# Patient Record
Sex: Female | Born: 1940 | Race: Black or African American | Hispanic: No | Smoking: Former smoker
Health system: Southern US, Community
[De-identification: ages and names within clinical notes are randomized; demographics above are authoritative.]

## PROBLEM LIST (undated history)

## (undated) DIAGNOSIS — D689 Coagulation defect, unspecified: Secondary | ICD-10-CM

## (undated) DIAGNOSIS — IMO0002 Reserved for concepts with insufficient information to code with codable children: Secondary | ICD-10-CM

## (undated) DIAGNOSIS — C50919 Malignant neoplasm of unspecified site of unspecified female breast: Secondary | ICD-10-CM

## (undated) DIAGNOSIS — Z803 Family history of malignant neoplasm of breast: Secondary | ICD-10-CM

## (undated) DIAGNOSIS — E88819 Insulin resistance, unspecified: Secondary | ICD-10-CM

## (undated) DIAGNOSIS — Z8042 Family history of malignant neoplasm of prostate: Secondary | ICD-10-CM

## (undated) DIAGNOSIS — M25571 Pain in right ankle and joints of right foot: Secondary | ICD-10-CM

## (undated) DIAGNOSIS — I5030 Unspecified diastolic (congestive) heart failure: Secondary | ICD-10-CM

## (undated) DIAGNOSIS — I2699 Other pulmonary embolism without acute cor pulmonale: Secondary | ICD-10-CM

## (undated) DIAGNOSIS — H269 Unspecified cataract: Secondary | ICD-10-CM

## (undated) DIAGNOSIS — R0602 Shortness of breath: Secondary | ICD-10-CM

## (undated) DIAGNOSIS — Z972 Presence of dental prosthetic device (complete) (partial): Secondary | ICD-10-CM

## (undated) DIAGNOSIS — R109 Unspecified abdominal pain: Secondary | ICD-10-CM

## (undated) DIAGNOSIS — M1990 Unspecified osteoarthritis, unspecified site: Secondary | ICD-10-CM

## (undated) DIAGNOSIS — I82409 Acute embolism and thrombosis of unspecified deep veins of unspecified lower extremity: Secondary | ICD-10-CM

## (undated) DIAGNOSIS — N63 Unspecified lump in unspecified breast: Secondary | ICD-10-CM

## (undated) DIAGNOSIS — E8881 Metabolic syndrome: Secondary | ICD-10-CM

## (undated) DIAGNOSIS — I878 Other specified disorders of veins: Secondary | ICD-10-CM

## (undated) DIAGNOSIS — C541 Malignant neoplasm of endometrium: Secondary | ICD-10-CM

## (undated) DIAGNOSIS — IMO0001 Reserved for inherently not codable concepts without codable children: Secondary | ICD-10-CM

## (undated) DIAGNOSIS — Z973 Presence of spectacles and contact lenses: Secondary | ICD-10-CM

## (undated) DIAGNOSIS — I10 Essential (primary) hypertension: Secondary | ICD-10-CM

## (undated) HISTORY — DX: Family history of malignant neoplasm of prostate: Z80.42

## (undated) HISTORY — DX: Unspecified abdominal pain: R10.9

## (undated) HISTORY — DX: Other pulmonary embolism without acute cor pulmonale: I26.99

## (undated) HISTORY — DX: Reserved for concepts with insufficient information to code with codable children: IMO0002

## (undated) HISTORY — DX: Other specified disorders of veins: I87.8

## (undated) HISTORY — DX: Reserved for inherently not codable concepts without codable children: IMO0001

## (undated) HISTORY — DX: Malignant neoplasm of unspecified site of unspecified female breast: C50.919

## (undated) HISTORY — DX: Essential (primary) hypertension: I10

## (undated) HISTORY — DX: Family history of malignant neoplasm of breast: Z80.3

## (undated) HISTORY — DX: Insulin resistance, unspecified: E88.819

## (undated) HISTORY — DX: Unspecified diastolic (congestive) heart failure: I50.30

## (undated) HISTORY — DX: Acute embolism and thrombosis of unspecified deep veins of unspecified lower extremity: I82.409

## (undated) HISTORY — DX: Unspecified lump in unspecified breast: N63.0

## (undated) HISTORY — DX: Metabolic syndrome: E88.81

## (undated) HISTORY — DX: Pain in right ankle and joints of right foot: M25.571

## (undated) HISTORY — DX: Unspecified cataract: H26.9

## (undated) HISTORY — DX: Coagulation defect, unspecified: D68.9

---

## 1998-01-07 ENCOUNTER — Emergency Department (HOSPITAL_COMMUNITY): Admission: EM | Admit: 1998-01-07 | Discharge: 1998-01-07 | Payer: Self-pay | Admitting: Emergency Medicine

## 1998-01-15 ENCOUNTER — Emergency Department (HOSPITAL_COMMUNITY): Admission: EM | Admit: 1998-01-15 | Discharge: 1998-01-15 | Payer: Self-pay | Admitting: Emergency Medicine

## 1999-06-15 ENCOUNTER — Ambulatory Visit (HOSPITAL_COMMUNITY): Admission: RE | Admit: 1999-06-15 | Discharge: 1999-06-15 | Payer: Self-pay | Admitting: Family Medicine

## 1999-06-15 ENCOUNTER — Encounter: Payer: Self-pay | Admitting: Family Medicine

## 2000-10-29 ENCOUNTER — Inpatient Hospital Stay (HOSPITAL_COMMUNITY): Admission: RE | Admit: 2000-10-29 | Discharge: 2000-10-30 | Payer: Self-pay | Admitting: Family Medicine

## 2001-01-27 ENCOUNTER — Emergency Department (HOSPITAL_COMMUNITY): Admission: EM | Admit: 2001-01-27 | Discharge: 2001-01-27 | Payer: Self-pay | Admitting: Emergency Medicine

## 2001-01-27 ENCOUNTER — Encounter: Payer: Self-pay | Admitting: Emergency Medicine

## 2001-02-18 ENCOUNTER — Ambulatory Visit: Admission: RE | Admit: 2001-02-18 | Discharge: 2001-02-18 | Payer: Self-pay | Admitting: Family Medicine

## 2001-03-30 ENCOUNTER — Ambulatory Visit (HOSPITAL_COMMUNITY): Admission: RE | Admit: 2001-03-30 | Discharge: 2001-03-30 | Payer: Self-pay | Admitting: *Deleted

## 2001-03-30 ENCOUNTER — Encounter: Payer: Self-pay | Admitting: *Deleted

## 2001-05-19 ENCOUNTER — Ambulatory Visit (HOSPITAL_COMMUNITY): Admission: RE | Admit: 2001-05-19 | Discharge: 2001-05-19 | Payer: Self-pay | Admitting: Family Medicine

## 2001-05-19 ENCOUNTER — Encounter: Payer: Self-pay | Admitting: Family Medicine

## 2001-08-07 ENCOUNTER — Emergency Department (HOSPITAL_COMMUNITY): Admission: EM | Admit: 2001-08-07 | Discharge: 2001-08-08 | Payer: Self-pay | Admitting: Emergency Medicine

## 2004-05-02 ENCOUNTER — Emergency Department (HOSPITAL_COMMUNITY): Admission: EM | Admit: 2004-05-02 | Discharge: 2004-05-02 | Payer: Self-pay | Admitting: Family Medicine

## 2004-06-05 ENCOUNTER — Ambulatory Visit: Payer: Self-pay | Admitting: Family Medicine

## 2004-06-07 ENCOUNTER — Encounter (INDEPENDENT_AMBULATORY_CARE_PROVIDER_SITE_OTHER): Payer: Self-pay | Admitting: *Deleted

## 2004-06-07 LAB — CONVERTED CEMR LAB

## 2004-06-24 ENCOUNTER — Encounter: Admission: RE | Admit: 2004-06-24 | Discharge: 2004-06-24 | Payer: Self-pay | Admitting: Sports Medicine

## 2004-06-27 ENCOUNTER — Ambulatory Visit: Payer: Self-pay | Admitting: Sports Medicine

## 2004-08-12 ENCOUNTER — Ambulatory Visit: Payer: Self-pay | Admitting: Family Medicine

## 2005-01-01 ENCOUNTER — Ambulatory Visit: Payer: Self-pay | Admitting: Family Medicine

## 2005-07-18 ENCOUNTER — Encounter: Admission: RE | Admit: 2005-07-18 | Discharge: 2005-07-18 | Payer: Self-pay | Admitting: Sports Medicine

## 2005-08-05 ENCOUNTER — Ambulatory Visit: Payer: Self-pay | Admitting: Family Medicine

## 2005-08-19 ENCOUNTER — Ambulatory Visit: Payer: Self-pay | Admitting: Sports Medicine

## 2005-09-30 ENCOUNTER — Ambulatory Visit: Payer: Self-pay | Admitting: Family Medicine

## 2005-11-04 ENCOUNTER — Ambulatory Visit: Payer: Self-pay | Admitting: Family Medicine

## 2005-11-14 ENCOUNTER — Ambulatory Visit: Payer: Self-pay | Admitting: Family Medicine

## 2006-01-02 ENCOUNTER — Ambulatory Visit: Payer: Self-pay | Admitting: Sports Medicine

## 2006-01-22 ENCOUNTER — Ambulatory Visit: Payer: Self-pay | Admitting: Family Medicine

## 2006-07-13 ENCOUNTER — Ambulatory Visit: Payer: Self-pay | Admitting: Family Medicine

## 2006-07-13 ENCOUNTER — Encounter: Admission: RE | Admit: 2006-07-13 | Discharge: 2006-07-13 | Payer: Self-pay | Admitting: Sports Medicine

## 2006-07-24 ENCOUNTER — Encounter: Admission: RE | Admit: 2006-07-24 | Discharge: 2006-07-24 | Payer: Self-pay | Admitting: Family Medicine

## 2006-07-31 ENCOUNTER — Encounter (INDEPENDENT_AMBULATORY_CARE_PROVIDER_SITE_OTHER): Payer: Self-pay | Admitting: *Deleted

## 2006-09-04 ENCOUNTER — Ambulatory Visit: Payer: Self-pay | Admitting: Family Medicine

## 2006-09-04 DIAGNOSIS — M1710 Unilateral primary osteoarthritis, unspecified knee: Secondary | ICD-10-CM

## 2006-09-04 DIAGNOSIS — IMO0002 Reserved for concepts with insufficient information to code with codable children: Secondary | ICD-10-CM | POA: Insufficient documentation

## 2006-09-14 ENCOUNTER — Emergency Department (HOSPITAL_COMMUNITY): Admission: EM | Admit: 2006-09-14 | Discharge: 2006-09-14 | Payer: Self-pay | Admitting: Emergency Medicine

## 2006-09-15 ENCOUNTER — Encounter: Payer: Self-pay | Admitting: Family Medicine

## 2006-09-24 ENCOUNTER — Ambulatory Visit: Payer: Self-pay | Admitting: Sports Medicine

## 2006-09-24 DIAGNOSIS — J45909 Unspecified asthma, uncomplicated: Secondary | ICD-10-CM | POA: Insufficient documentation

## 2006-09-24 DIAGNOSIS — I10 Essential (primary) hypertension: Secondary | ICD-10-CM | POA: Insufficient documentation

## 2006-09-25 ENCOUNTER — Telehealth: Payer: Self-pay | Admitting: *Deleted

## 2006-09-30 ENCOUNTER — Telehealth: Payer: Self-pay | Admitting: *Deleted

## 2006-09-30 ENCOUNTER — Encounter: Payer: Self-pay | Admitting: Family Medicine

## 2007-03-30 ENCOUNTER — Encounter: Payer: Self-pay | Admitting: Family Medicine

## 2007-03-30 ENCOUNTER — Ambulatory Visit: Payer: Self-pay | Admitting: Family Medicine

## 2007-03-30 LAB — CONVERTED CEMR LAB
BUN: 10 mg/dL (ref 6–23)
CO2: 28 meq/L (ref 19–32)
Calcium: 9.6 mg/dL (ref 8.4–10.5)
Chloride: 103 meq/L (ref 96–112)
Creatinine, Ser: 0.73 mg/dL (ref 0.40–1.20)
Glucose, Bld: 113 mg/dL — ABNORMAL HIGH (ref 70–99)
Potassium: 4 meq/L (ref 3.5–5.3)
Sodium: 141 meq/L (ref 135–145)
TSH: 2.201 microintl units/mL (ref 0.350–5.50)

## 2007-07-26 ENCOUNTER — Encounter: Admission: RE | Admit: 2007-07-26 | Discharge: 2007-07-26 | Payer: Self-pay | Admitting: Sports Medicine

## 2008-01-27 ENCOUNTER — Encounter (INDEPENDENT_AMBULATORY_CARE_PROVIDER_SITE_OTHER): Payer: Self-pay | Admitting: Family Medicine

## 2008-01-27 ENCOUNTER — Ambulatory Visit: Payer: Self-pay | Admitting: Family Medicine

## 2008-01-27 DIAGNOSIS — E669 Obesity, unspecified: Secondary | ICD-10-CM | POA: Insufficient documentation

## 2008-01-27 LAB — CONVERTED CEMR LAB
Cholesterol: 154 mg/dL (ref 0–200)
HDL: 49 mg/dL (ref >39)
LDL Cholesterol: 93 mg/dL (ref 0–99)
Total CHOL/HDL Ratio: 3.1
Triglycerides: 59 mg/dL (ref <150)
VLDL: 12 mg/dL (ref 0–40)

## 2008-02-03 ENCOUNTER — Encounter (INDEPENDENT_AMBULATORY_CARE_PROVIDER_SITE_OTHER): Payer: Self-pay | Admitting: Family Medicine

## 2008-03-06 ENCOUNTER — Ambulatory Visit: Payer: Self-pay | Admitting: Family Medicine

## 2008-07-27 ENCOUNTER — Encounter: Admission: RE | Admit: 2008-07-27 | Discharge: 2008-07-27 | Payer: Self-pay | Admitting: Family Medicine

## 2008-07-28 ENCOUNTER — Ambulatory Visit: Payer: Self-pay | Admitting: Family Medicine

## 2008-07-28 DIAGNOSIS — Z8672 Personal history of thrombophlebitis: Secondary | ICD-10-CM | POA: Insufficient documentation

## 2008-08-16 ENCOUNTER — Encounter: Admission: RE | Admit: 2008-08-16 | Discharge: 2008-08-16 | Payer: Self-pay | Admitting: Family Medicine

## 2009-01-19 ENCOUNTER — Ambulatory Visit: Payer: Self-pay | Admitting: Family Medicine

## 2009-01-19 ENCOUNTER — Encounter: Payer: Self-pay | Admitting: Family Medicine

## 2009-01-19 LAB — CONVERTED CEMR LAB
BUN: 15 mg/dL (ref 6–23)
CO2: 25 meq/L (ref 19–32)
Calcium: 9 mg/dL (ref 8.4–10.5)
Chloride: 104 meq/L (ref 96–112)
Cholesterol: 154 mg/dL (ref 0–200)
Creatinine, Ser: 0.73 mg/dL (ref 0.40–1.20)
Glucose, Bld: 111 mg/dL — ABNORMAL HIGH (ref 70–99)
HDL: 45 mg/dL (ref >39)
LDL Cholesterol: 98 mg/dL (ref 0–99)
Potassium: 3.8 meq/L (ref 3.5–5.3)
Sodium: 141 meq/L (ref 135–145)
Total CHOL/HDL Ratio: 3.4
Triglycerides: 55 mg/dL (ref <150)
VLDL: 11 mg/dL (ref 0–40)

## 2009-03-08 ENCOUNTER — Ambulatory Visit: Payer: Self-pay | Admitting: Family Medicine

## 2009-04-27 ENCOUNTER — Emergency Department (HOSPITAL_COMMUNITY): Admission: EM | Admit: 2009-04-27 | Discharge: 2009-04-27 | Payer: Self-pay | Admitting: Emergency Medicine

## 2009-05-07 ENCOUNTER — Encounter: Payer: Self-pay | Admitting: Family Medicine

## 2009-05-10 ENCOUNTER — Telehealth (INDEPENDENT_AMBULATORY_CARE_PROVIDER_SITE_OTHER): Payer: Self-pay | Admitting: *Deleted

## 2009-05-10 ENCOUNTER — Ambulatory Visit: Payer: Self-pay | Admitting: Family Medicine

## 2009-05-10 DIAGNOSIS — R42 Dizziness and giddiness: Secondary | ICD-10-CM | POA: Insufficient documentation

## 2009-05-10 LAB — CONVERTED CEMR LAB
Bilirubin Urine: NEGATIVE
Glucose, Urine, Semiquant: NEGATIVE
Hgb A1c MFr Bld: 6 %
Ketones, urine, test strip: NEGATIVE
Nitrite: NEGATIVE
Protein, U semiquant: NEGATIVE
Specific Gravity, Urine: 1.02
Urobilinogen, UA: 0.2
pH: 6

## 2009-05-11 ENCOUNTER — Telehealth: Payer: Self-pay | Admitting: Family Medicine

## 2009-09-05 ENCOUNTER — Encounter: Admission: RE | Admit: 2009-09-05 | Discharge: 2009-09-05 | Payer: Self-pay | Admitting: Family Medicine

## 2009-10-25 ENCOUNTER — Ambulatory Visit: Payer: Self-pay | Admitting: Family Medicine

## 2009-10-25 DIAGNOSIS — I872 Venous insufficiency (chronic) (peripheral): Secondary | ICD-10-CM | POA: Insufficient documentation

## 2010-01-04 ENCOUNTER — Encounter: Payer: Self-pay | Admitting: Family Medicine

## 2010-01-04 ENCOUNTER — Ambulatory Visit: Payer: Self-pay | Admitting: Family Medicine

## 2010-01-04 LAB — CONVERTED CEMR LAB
BUN: 10 mg/dL (ref 6–23)
CO2: 28 meq/L (ref 19–32)
Calcium: 9.2 mg/dL (ref 8.4–10.5)
Chloride: 100 meq/L (ref 96–112)
Creatinine, Ser: 0.92 mg/dL (ref 0.40–1.20)
Glucose, Bld: 95 mg/dL (ref 70–99)
Hgb A1c MFr Bld: 6.1 %
Potassium: 3.6 meq/L (ref 3.5–5.3)
Sodium: 138 meq/L (ref 135–145)

## 2010-02-01 ENCOUNTER — Ambulatory Visit: Payer: Self-pay | Admitting: Family Medicine

## 2010-02-01 ENCOUNTER — Encounter: Payer: Self-pay | Admitting: Family Medicine

## 2010-02-01 LAB — CONVERTED CEMR LAB
BUN: 18 mg/dL (ref 6–23)
CO2: 29 meq/L (ref 19–32)
Calcium: 9.6 mg/dL (ref 8.4–10.5)
Chloride: 100 meq/L (ref 96–112)
Creatinine, Ser: 0.89 mg/dL (ref 0.40–1.20)
Glucose, Bld: 116 mg/dL — ABNORMAL HIGH (ref 70–99)
Potassium: 3.6 meq/L (ref 3.5–5.3)
Sodium: 141 meq/L (ref 135–145)

## 2010-03-19 ENCOUNTER — Ambulatory Visit: Payer: Self-pay | Admitting: Family Medicine

## 2010-06-04 ENCOUNTER — Ambulatory Visit
Admission: RE | Admit: 2010-06-04 | Discharge: 2010-06-04 | Payer: Self-pay | Source: Home / Self Care | Attending: Family Medicine | Admitting: Family Medicine

## 2010-06-10 ENCOUNTER — Encounter: Payer: Self-pay | Admitting: Family Medicine

## 2010-06-18 ENCOUNTER — Ambulatory Visit
Admission: RE | Admit: 2010-06-18 | Discharge: 2010-06-18 | Payer: Self-pay | Source: Home / Self Care | Attending: Sports Medicine | Admitting: Sports Medicine

## 2010-06-18 ENCOUNTER — Ambulatory Visit
Admission: RE | Admit: 2010-06-18 | Discharge: 2010-06-18 | Payer: Self-pay | Source: Home / Self Care | Attending: Family Medicine | Admitting: Family Medicine

## 2010-06-18 DIAGNOSIS — M2140 Flat foot [pes planus] (acquired), unspecified foot: Secondary | ICD-10-CM | POA: Insufficient documentation

## 2010-06-23 ENCOUNTER — Encounter: Payer: Self-pay | Admitting: Family Medicine

## 2010-07-01 ENCOUNTER — Ambulatory Visit: Admission: RE | Admit: 2010-07-01 | Discharge: 2010-07-01 | Payer: Self-pay | Source: Home / Self Care

## 2010-07-02 NOTE — Progress Notes (Signed)
Summary: phn msg  Phone Note Call from Patient Call back at Home Phone [phone]   Caller: Patient Summary of Call: thinks that the metformin is giving her diarrhea and is not sure if she should take anymore. Initial call taken by: De Nurse,  May 11, 2009 2:58 PM  Follow-up for Phone Call        spoke with Dr. Deirdre Priest. called pt & told her to take a day off with no metformin. then restart with half pill x 1 week, then go to 1 pill x 1 week then up to the 2 tabs daily that is md goal. to buy & use immodium. call if this continues. fyi to pcp... Follow-up by: Golden Circle RN,  May 11, 2009 3:01 PM  Additional Follow-up for Phone Call Additional follow up Details #1::        thanks Additional Follow-up by: Jamie Brookes MD,  May 16, 2009 7:34 AM

## 2010-07-02 NOTE — Assessment & Plan Note (Signed)
Summary: ANKLE AND FOOT PROBLEM/KH  Medications Added FUROSEMIDE 20 MG  TABS (FUROSEMIDE) use as directed ADVAIR DISKUS 100-50 MCG/DOSE MISC (FLUTICASONE-SALMETEROL) Inhale 1 puff as directed twice a day ALBUTEROL 90 MCG/ACT AERS (ALBUTEROL) Inhale 2 puff using inhaler every four hours ENALAPRIL-HYDROCHLOROTHIAZIDE 10-25 MG TABS (ENALAPRIL-HYDROCHLOROTHIAZIDE)         Vital Signs:  Patient Profile:   70 Years Old Female Weight:      301.6 pounds Temp:     98.8 degrees F Pulse rate:   82 / minute BP sitting:   128 / 82  Pt. in pain?   no  Vitals Entered By: Altamese Dilling CMA, (March 30, 2007 8:35 AM)                  Chief Complaint:  Left ankle swollen/color change.  History of Present Illness: Swelling of legs, some darkening of the skin.  This worries her.  She tries to get up and move around while watching TV.  Does not like to walk for fear of dogs.  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 24 hour diet recall:  egg and balogna sandwich, baked potato with butter, meat load, cabbage and mashed potatoes.  She drinks sweet tea all day.    Review of prevention, has not had screening colonoscopy, stool card negative in 07.  Flu shot this year and mammogram not due.  Normal PAP in 06, never had an abnormal PAP, and has not been sexually active so will go to every 3 years unless she has symptoms of bleeding or pain.      Risk Factors:  Mammogram History:     Date of Last Mammogram:  07/10/2000    Results:  normal    Review of Systems  General      Denies fatigue, fever, and sweats.  CV      Complains of swelling of feet.      Denies chest pain or discomfort and palpitations.  Resp      Denies shortness of breath, sputum productive, and wheezing.  GI      Denies bloody stools and change in bowel habits.  GU      Denies discharge, dysuria, and urinary frequency.  MS      Complains of joint pain and stiffness.      mostly left knee pain  and stiffness   Physical Exam  General:     alert, and obese (301) Neck:     No deformities, masses, or tenderness noted. Lungs:     Normal respiratory effort, chest expands symmetrically. Lungs are clear to auscultation, no crackles or wheezes. Heart:     Normal rate and regular rhythm. S1 and S2 normal without gallop, murmur, click, rub or other extra sounds. Extremities:     trace left pedal edema and trace right pedal edema. with skin changes consistent with venous stasis     Impression & Recommendations:  Problem # 1:  ANKLE EDEMA (ICD-782.3) Counseled on increasing venous return with stockings, elevation, exercise.  Lubricate skin to prevent dryness and cracking.  May use furosemide as needed for sever edema a few times per week only.  Eat extra K rich foods on that day. Her updated medication list for this problem includes:    Furosemide 20 Mg Tabs (Furosemide) ..... Use as directed    Enalapril-hydrochlorothiazide 10-25 Mg Tabs (Enalapril-hydrochlorothiazide)  Orders: TSH-[facility] ([id number])   Problem # 2:  HYPERTENSION, BENIGN ESSENTIAL (ICD-401.1) Well controlled Her  updated medication list for this problem includes:    Furosemide 20 Mg Tabs (Furosemide) ..... 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  Exercise in a non weight bearing manner.  May need Ortho referral for joint infections/replacement in the future.  Tylenol prn  Problem # 4:  ASTHMA, INTERMITTENT, MODERATE (ICD-493.10) Well controlled, not using Advair at this time. Her updated medication list for this problem includes:    Advair Diskus 100-50 Mcg/dose Misc (Fluticasone-salmeterol) ..... Inhale 1 puff as directed twice a day    Albuterol 90 Mcg/act Aers (Albuterol) ..... Inhale 2 puff using inhaler  every four hours   Complete Medication List: 1)  Furosemide 20 Mg Tabs (Furosemide) .... Use as directed 2)  Advair Diskus 100-50 Mcg/dose Misc (Fluticasone-salmeterol) .... Inhale 1 puff as directed twice a day 3)  Albuterol 90 Mcg/act Aers (Albuterol) .... Inhale 2 puff using inhaler every four hours 4)  Enalapril-hydrochlorothiazide 10-25 Mg Tabs (Enalapril-hydrochlorothiazide)  Other Orders: Gastroenterology Referral (GI)   Patient Instructions: 1)  Limit salt 2)  decrease carbs 3)  change to artificial sweetner for ice tea 4)  non weight bearing exercise 5)  return in 3 months 6)  Recommend colonoscopy-referral placed 7)  Schedule a colonoscopy/sigmoidoscopy to help detect colon cancer.    Prescriptions: FUROSEMIDE 20 MG  TABS (FUROSEMIDE) use as directed  #30 x 3   Entered and Authorized by:   Luretha Murphy NP   Signed by:   Luretha Murphy NP on 03/30/2007   Method used:   Electronically sent to ...       [facility] [address] [phone][facility]       [address]       [place], [place]  [address]       Ph: [phone] or [phone]       Fax: [phone]   RxID:   [id number]  ]  Preventive Care Screening  Last Flu Shot:    Date:  03/25/2007    Results:  given   Hemoccult:    Date:  07/09/2005    Results:  negative   Mammogram:    Date:  07/10/2000    Results:  normal     Appended Document: Orders Update    Clinical Lists Changes  Orders: Added new Test order of [facility]- Est Level  3 (63875) - Signed

## 2010-07-02 NOTE — Progress Notes (Signed)
Summary: Status of Advair  Phone Note Call from Patient Call back at [phone]   Summary of Call: is checking status of advair Initial call taken by: Haydee Salter,  September 30, 2006 11:54 AM  Follow-up for Phone Call        fax failed on 09/24/06 when sent in to pharmacy via Dr. Tiajuana Amass. resent today and pt notified. Follow-up by: Theresia Lo RN,  September 30, 2006 12:20 PM

## 2010-07-02 NOTE — Assessment & Plan Note (Signed)
Summary: HTN, Obesity, Edema, Labs   Vital Signs:  Patient profile:   70 year old female Weight:      299.2 pounds BMI:     48.47 Temp:     98.3 degrees F oral Pulse rate:   77 / minute BP sitting:   130 / 70  (left arm)  Vitals Entered By: Alphia Kava (January 19, 2009 8:39 AM) CC: HTN, Obesty, Edema, labs Is Patient Diabetic? No   Primary Care Provider:  Jamie Brookes MD  CC:  HTN, Obesty, Edema, and labs.  History of Present Illness: hypertension: Pt has well controlled hypertension. Her BP today is 130/70. She has not had any lightheadedness or dizziness. She is not feeling any side effects with her medications. She has no SOB or chest pains. She can lie flat but chooses to sleep on 2 pillows. She has no SOB unless her asthma flares up.   Obesity: Panning to start Silver Sneakers again. She has been watching her grandaughter for a few months and got out of exercising. Wants to start again.   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   Habits & Providers  Alcohol-Tobacco-Diet     Tobacco Status: never  Current Medications (verified): 1)  Furosemide 20 Mg  Tabs (Furosemide) .... One By Mouth As Needed For Fluid Overload and Swelling. 2)  Advair Diskus 100-50 Mcg/dose Misc (Fluticasone-Salmeterol) .... Inhale 1 Puff As Directed Twice A Day 3)  Albuterol 90 Mcg/act Aers (Albuterol) .... Inhale 2 Puff Using Inhaler Every Four Hours 4)  Enalapril-Hydrochlorothiazide 10-25 Mg Tabs (Enalapril-Hydrochlorothiazide) .... One By Mouth Daily For Blood Pressure. 5)  Aspirin 81 Mg  Tbec (Aspirin) .... One By Mouth Every Day 6)  Metamucil 48.57 % Powd (Psyllium)  Allergies (verified): No Known Drug Allergies  Social History: Smoking Status:  never  Review of Systems       see HPI.   Physical Exam  General:  Obese, in no acute distress; alert,appropriate and cooperative throughout examination Neck:  No  deformities, masses, or tenderness noted. Lungs:  Normal respiratory effort, chest expands symmetrically. Lungs are clear to auscultation, no crackles or wheezes. Heart:  Normal rate and regular rhythm. S1 and S2 normal without gallop, murmur, click, rub or other extra sounds. Extremities:  left lower leg swelling and skin thickened. Rt lower leg has minor swelling.  Skin:  venous stasis changes on Left lower leg Psych:  Cognition and judgment appear intact. Alert and cooperative with normal attention span and concentration. No apparent delusions, illusions, hallucinations   Impression & Recommendations:  Problem # 1:  HYPERTENSION, BENIGN ESSENTIAL (ICD-401.1) Assessment Improved BP is well controlled today. Encouraged pt to check at home or at pharmacy. Cont on meds.   Her updated medication list for this problem includes:    Furosemide 20 Mg Tabs (Furosemide) ..... One by mouth as needed for fluid overload and swelling.    Enalapril-hydrochlorothiazide 10-25 Mg Tabs (Enalapril-hydrochlorothiazide) ..... One by mouth daily for blood pressure.  Orders: Basic Met[facility] ([id number]) [facility]- Est  Level 4 (09811)  Problem # 2:  OBESITY (ICD-278.00) Assessment: Deteriorated Pt has incresed weiht by 7 lbs since her last visit. She has not been exercising. She plans to get back into doing the silver sneakers.   Orders: Lipid-[facility] ([id number]) [facility]- Est  Level 4 (13086)  Problem # 3:  ANKLE EDEMA (ICD-782.3) Assessment: Unchanged Pt has chornic edema in  lower Left leg. She has had it since she had a DVT. Cont to use occasional Lasix for swelling.  Her updated medication list for this problem includes:    Furosemide 20 Mg Tabs (Furosemide) ..... One by mouth as needed for fluid overload and swelling.    Enalapril-hydrochlorothiazide 10-25 Mg Tabs (Enalapril-hydrochlorothiazide) ..... One by mouth daily for blood pressure.  Orders: [facility]- Est  Level 4 ([address])  Complete Medication List: 1)   Furosemide 20 Mg Tabs (Furosemide) .... One by mouth as needed for fluid overload and swelling. 2)  Advair Diskus 100-50 Mcg/dose Misc (Fluticasone-salmeterol) .... Inhale 1 puff as directed twice a day 3)  Albuterol 90 Mcg/act Aers (Albuterol) .... Inhale 2 puff using inhaler every four hours 4)  Enalapril-hydrochlorothiazide 10-25 Mg Tabs (Enalapril-hydrochlorothiazide) .... One by mouth daily for blood pressure. 5)  Aspirin 81 Mg Tbec (Aspirin) .... One by mouth every day 6)  Metamucil 48.57 % Powd (Psyllium)  Patient Instructions: 1)  It was nice to meet you. 2)  We will get some lab work done today. We are checking your Cholesterol and electrolytes. I will call you if there are any abnormalities.  3)  We refilled your Lasix and Enalapril-HCTZ combo.  4)  Please schedule a follow-up appointment in 1 year.  Prescriptions: ENALAPRIL-HYDROCHLOROTHIAZIDE 10-25 MG TABS (ENALAPRIL-HYDROCHLOROTHIAZIDE) one by mouth daily for blood pressure.  #31 x 11   Entered and Authorized by:   Jamie Brookes MD   Signed by:   Jamie Brookes MD on 01/19/2009   Method used:   [facility]        [facility] [phone][facility] (retail)       [address]       [place], [place]  [address]       Ph: [phone] or [phone]       Fax: [phone]   RxID:   [id number] FUROSEMIDE 20 MG  TABS (FUROSEMIDE) one by mouth as needed for fluid overload and swelling.  #34 x 1   Entered and Authorized by:   Jamie Brookes MD   Signed by:   Jamie Brookes MD on 01/19/2009   Method used:   [facility] to        [facility] [phone]* (retail)       [address]       [place], [place]  [address]       Ph: [phone] or [phone]       Fax: [phone]   RxID:   [id number]    Prevention & Chronic Care Immunizations   Influenza vaccine: Fluvax 3+  (03/06/2008)   Influenza vaccine due: 03/24/2008    Tetanus booster:  01/06/2006: Done.   Tetanus booster due: 01/07/2016    Pneumococcal vaccine: Done.  (01/06/2006)   Pneumococcal vaccine due: None    H. zoster vaccine: Not documented  Colorectal Screening   Hemoccult: negative  (07/09/2005)   Hemoccult due: 07/09/2006    Colonoscopy: Not documented  Other Screening   Pap smear: Done.  (06/07/2004)   Pap smear due: Refused  (01/27/2008)    Mammogram: normal  (08/16/2008)   Mammogram due: 08/16/2009    DXA bone density scan: Not documented   Smoking status: never  (01/19/2009)  Lipids   Total Cholesterol: 154  (01/27/2008)   LDL: 93  (01/27/2008)   LDL Direct: Not documented   HDL: 49  (  01/27/2008)   Triglycerides: 59  (01/27/2008)  Hypertension   Last Blood Pressure: 130 / 70  (01/19/2009)   Serum creatinine: 0.73  (03/30/2007)   Serum potassium 4.0  (03/30/2007)    Hypertension flowsheet reviewed?: Yes  Self-Management Support :    Hypertension self-management support: Not documented    Prevention & Chronic Care Immunizations   Influenza vaccine: Fluvax 3+  (03/06/2008)   Influenza vaccine due: 03/24/2008    Tetanus booster: 01/06/2006: Done.   Tetanus booster due: 01/07/2016    Pneumococcal vaccine: Done.  (01/06/2006)   Pneumococcal vaccine due: None    H. zoster vaccine: Not documented  Colorectal Screening   Hemoccult: negative  (07/09/2005)   Hemoccult due: 07/09/2006    Colonoscopy: Not documented  Other Screening   Pap smear: Done.  (06/07/2004)   Pap smear due: Refused  (01/27/2008)    Mammogram: normal  (08/16/2008)   Mammogram due: 08/16/2009    DXA bone density scan: Not documented   Smoking status: never  (01/19/2009)  Lipids   Total Cholesterol: 154  (01/27/2008)   LDL: 93  (01/27/2008)   LDL Direct: Not documented   HDL: 49  (01/27/2008)   Triglycerides: 59  (01/27/2008)  Hypertension   Last Blood Pressure: 130 / 70  (01/19/2009)   Serum creatinine: 0.73  (03/30/2007)   Serum  potassium 4.0  (03/30/2007)    Hypertension flowsheet reviewed?: Yes  Self-Management Support :    Hypertension self-management support: Not documented

## 2010-07-02 NOTE — Assessment & Plan Note (Signed)
Summary: HTN/edema   Vital Signs:  Patient profile:   70 year old female Height:      66 inches Weight:      302 pounds BMI:     48.92 Temp:     98.0 degrees F oral Pulse rate:   87 / minute BP sitting:   148 / 76  (left arm) Cuff size:   large  Vitals Entered By: Jimmy Footman, CMA (January 04, 2010 1:42 PM) CC: f/u on pt. BP meds. Is Patient Diabetic? No Pain Assessment Patient in pain? no        Primary Care Provider:  Jamie Brookes MD  CC:  f/u on pt. BP meds..  History of Present Illness: Hypertension: Pt comes in today with elelvated BP and still has quite a bit of fluid on her legs. She says the swelling is a little better but she still has quite a bit of it. She sometimes put her legs up at night. Her BP was fairly well controlled up until now but has been slowly creeping up. She is urinating at night but says that this does not bother her. No complaint of SOB or chest pain. No fevers or chills.        Habits & Providers  Alcohol-Tobacco-Diet     Tobacco Status: quit  Current Medications (verified): 1)  Furosemide 40 Mg Tabs (Furosemide) .... Take One Pill Every Morning and Evening For Leg Swelling and Blood Pressure 2)  Advair Diskus 100-50 Mcg/dose Misc (Fluticasone-Salmeterol) .... Inhale 1 Puff As Directed Twice A Day 3)  Albuterol 90 Mcg/act Aers (Albuterol) .... Inhale 2 Puff Using Inhaler Every Four Hours 4)  Enalapril-Hydrochlorothiazide 10-25 Mg Tabs (Enalapril-Hydrochlorothiazide) .... One By Mouth Daily For Blood Pressure. 5)  Aspirin 81 Mg  Tbec (Aspirin) .... One By Mouth Every Day 6)  Colace 100 Mg Caps (Docusate Sodium) .... Take 1 Time A Day  Allergies (verified): No Known Drug Allergies  Physical Exam  General:  Well-developed,well-nourished,in no acute distress; alert,appropriate and cooperative throughout examination Lungs:  Normal respiratory effort, chest expands symmetrically. Lungs are clear to auscultation, no crackles or  wheezes. Heart:  Normal rate and regular rhythm. S1 and S2 normal without gallop, murmur, click, rub or other extra sounds. Extremities:  3+ pitting edema bilaterally, venous stasis changes bilaterally but worse on left leg.  Skin:  chronic venous stasis changes on left leg worse than right.  Psych:  Cognition and judgment appear intact. Alert and cooperative with normal attention span and concentration. No apparent delusions, illusions, hallucinations   Impression & Recommendations:  Problem # 1:  HYPERTENSION, BENIGN ESSENTIAL (ICD-401.1) Assessment Deteriorated PT's BP has worsened over the last few months. Will increase Lasix to two times a day to decrease BP and get more fluid off her legs. Pt will need an ECHO in the near future.   Her updated medication list for this problem includes:    Furosemide 40 Mg Tabs (Furosemide) .Marland Kitchen... Take one pill every morning and evening for leg swelling and blood pressure    Enalapril-hydrochlorothiazide 10-25 Mg Tabs (Enalapril-hydrochlorothiazide) ..... One by mouth daily for blood pressure.  Orders: Basic Met-[facility] [id number]) [facility]- Est Level  3 (99213)Future Orders: Basic Met-[facility] ([id number]) ... 01/22/2011  Complete Medication List: 1)  Furosemide 40 Mg Tabs (Furosemide) .... Take one pill every morning and evening for leg swelling and blood pressure 2)  Advair Diskus 100-50 Mcg/dose Misc (Fluticasone-salmeterol) .... Inhale 1 puff as directed twice a day 3)  Albuterol 90  Mcg/act Aers (Albuterol) .... Inhale 2 puff using inhaler every four hours 4)  Enalapril-hydrochlorothiazide 10-25 Mg Tabs (Enalapril-hydrochlorothiazide) .... One by mouth daily for blood pressure. 5)  Aspirin 81 Mg Tbec (Aspirin) .... One by mouth every day 6)  Colace 100 Mg Caps (Docusate sodium) .... Take 1 time a day  Other Orders: A1C-[facility] (27253) Colonoscopy (Colon)  Patient Instructions: 1)  take 1 pill of the Furosemide in the morning and one in the  evening. Prescriptions: FUROSEMIDE 40 MG TABS (FUROSEMIDE) take one pill every morning and evening for leg swelling and blood pressure  #62 x 4   Entered and Authorized by:   Jamie Brookes MD   Signed by:   Jamie Brookes MD on 01/07/2010   Method used:   [facility] to        [facility] [phone][facility] (retail)       [address]       [place], [place]  [id number]       Ph: [phone] or [phone]       Fax: [phone]   RxID:   [id number] FUROSEMIDE 40 MG TABS (FUROSEMIDE) take one pill daily for leg swelling  #62 x 4   Entered and Authorized by:   Jamie Brookes MD   Signed by:   Jamie Brookes MD on 01/04/2010   Method used:   [facility] to        [facility] [phone]* (retail)       [address]       [place], [place]  [address]       Ph: [phone] or [phone]       Fax: [phone]   RxID:   [id number]    Prevention & Chronic Care Immunizations   Influenza vaccine: Fluvax MCR  (03/08/2009)   Influenza vaccine due: 03/24/2008    Tetanus booster: 01/06/2006: Done.   Tetanus booster due: 01/07/2016    Pneumococcal vaccine: Done.  (01/06/2006)   Pneumococcal vaccine due: None    H. zoster vaccine: Not documented  Colorectal Screening   Hemoccult: negative  (07/09/2005)   Hemoccult action/deferral: Not indicated  (01/04/2010)   Hemoccult due: 07/09/2006    Colonoscopy: Not documented   Colonoscopy action/deferral: GI Referral  (01/04/2010)  Other Screening   Pap smear: Done.  (06/07/2004)   Pap smear action/deferral: Deferred-3 yr interval  (01/04/2010)   Pap smear due: Refused  (01/27/2008)    Mammogram: ASSESSMENT: Negative - BI-RADS 1^MM DIGITAL SCREENING  (09/05/2009)   Mammogram due: 08/16/2009    DXA bone density scan: Not documented   DXA bone density action/deferral: Not indicated  (05/10/2009)   Smoking status: quit  (01/04/2010)  Lipids   Total  Cholesterol: 154  (01/19/2009)   LDL: 98  (01/19/2009)   LDL Direct: Not documented   HDL: 45  (01/19/2009)   Triglycerides: 55  (01/19/2009)  Hypertension   Last Blood Pressure: 148 / 76  (01/04/2010)   Serum creatinine: 0.73  (01/19/2009)   Serum potassium 3.8  (01/19/2009)  Self-Management Support :    Hypertension self-management support: Education handout, Written self-care plan  (05/10/2009)   Nursing Instructions: Screening colonoscopy ordered   Laboratory Results   Blood Tests   Date/Time Received: January 04, 2010 2:10 PM  Date/Time Reported: January 04, 2010 2:23 PM   HGBA1C: 6.1%   (Normal Range: Non-Diabetic - 3-6%  Control Diabetic - 6-8%)  Comments: ...........test performed by...........Marland KitchenTerese Door, CMA

## 2010-07-02 NOTE — Assessment & Plan Note (Signed)
Summary: dizziness, hyperglycemia, obesity   Vital Signs:  Patient profile:   70 year old female Weight:      290.6 pounds Temp:     98.1 degrees F Pulse rate:   80 / minute Pulse (ortho):   74 / minute Pulse rhythm:   regular BP sitting:   136 / 78  (right arm) BP standing:   132 / 72 Cuff size:   large  Vitals Entered By: Loralee Pacas CMA (May 10, 2009 10:16 AM)  Serial Vital Signs/Assessments:  Time      Position  BP       Pulse  Resp  Temp     By 11:18 AM            118/68                         Loralee Pacas CMA           Lying RA  118/68   74                    Tonya Barkley CMA           Lying LA  120/70   76                    Loralee Pacas CMA           Sitting   138/66   72                    Loralee Pacas CMA           Standing  132/72   74                    Loralee Pacas CMA   Primary Care Provider:  Jamie Brookes MD  CC:  Dizziness, Hyperglycemia, and .  History of Present Illness: Dizziness: PT comes in c/o dizziness when she goes from sitting to standing. It comes and goes so it not always present however today it is present. she says is she stands still for a minute it goes away and she is able to move. She had a runny nose yesterday, but has not had any recent head colds or ear infections. She went to the ED on 04-27-09 for the dizziness and was found to have a high blood pressure. They ran a lot of tests(labs, CT, CXR, EKG ...) that were neg, except they found her to have a UTI. She was treated for the UTI (Ceftin 500 x 5 days) and says that her dizziness got better while on the antibiotic. She also has had a tooth pulled for which she got antibiotics. She has no fevers, no chills, no nausea or vomiting and no discharge.   Hyperglycemia: Pt was found to be hyperglycemic the last time she was tested. The patient has some risk factors for diabetes including family history and obesity. HGA1c tested today was 6.0 which is high enough to diagnose diabetes  according to the new guidelines. Discussed healthy diet and exercise. Discussed starting Metformin.   Prevention: Pt does not want to have a colonoscopy. She says she would rather do the hemocult cards. Pt given one today.   Habits & Providers  Alcohol-Tobacco-Diet     Tobacco Status: quit  Current Medications (verified): 1)  Furosemide 20 Mg  Tabs (Furosemide) .... One By Mouth As Needed For Fluid Overload and Swelling. 2)  Advair Diskus 100-50 Mcg/dose Misc (Fluticasone-Salmeterol) .... Inhale 1 Puff As Directed Twice A Day 3)  Albuterol 90 Mcg/act Aers (Albuterol) .... Inhale 2 Puff Using Inhaler Every Four Hours 4)  Enalapril-Hydrochlorothiazide 10-25 Mg Tabs (Enalapril-Hydrochlorothiazide) .... One By Mouth Daily For Blood Pressure. 5)  Aspirin 81 Mg  Tbec (Aspirin) .... One By Mouth Every Day 6)  Metamucil 48.57 % Powd (Psyllium) 7)  Meclizine Hcl 25 Mg Tabs (Meclizine Hcl) .... Take One Pill Twice Daily For Dizziness 8)  Metformin Hcl 500 Mg Tabs (Metformin Hcl) .... Take One Pill Daily For 1 Week, Then Advance To Two Pills Daily.  Allergies (verified): No Known Drug Allergies  Social History: Smoking Status:  quit  Review of Systems        vitals reviewed and pertinent negatives and positives seen in HPI   Physical Exam  General:  Well-developed,well-nourished,in no acute distress; alert,appropriate and cooperative throughout examination Head:  Normocephalic and atraumatic without obvious abnormalities. No apparent alopecia or balding. Ears:  External ear exam shows no significant lesions or deformities.  Otoscopic examination reveals clear canals, tympanic membranes are intact bilaterally without bulging, retraction, inflammation or discharge. Hearing is grossly normal bilaterally. Nose:  External nasal examination shows no deformity or inflammation. Nasal mucosa are pink and moist without lesions or exudates. Mouth:  Oral mucosa and oropharynx without lesions or  exudates.  Teeth in good repair. Neck:  No deformities, masses, or tenderness noted. Lungs:  Normal respiratory effort, chest expands symmetrically. Lungs are clear to auscultation, no crackles or wheezes. Heart:  Normal rate and regular rhythm. S1 and S2 normal without gallop, murmur, click, rub or other extra sounds. Additional Exam:  Epply manuver done with Dr. Mauricio Po illicited a small Lt beating nystagmus but no real dizziness.    Impression & Recommendations:  Problem # 1:  DIZZINESS (ICD-780.4) Assessment New The patient is not hypotensive or orthostatic. She was negative on Epply's manuever. It does not appear to be cardiogenic or CNS caused. PT will be treated with Meclizine.I will see her again in 3 months to evaluate her hyperglycemia and dizziness.   Her updated medication list for this problem includes:    Meclizine Hcl 25 Mg Tabs (Meclizine hcl) .Marland Kitchen... Take one pill twice daily for dizziness  Orders: [facility]- Est  Level 4 (16109)  Problem # 2:  HYPERGLYCEMIA (ICD-790.29) Assessment: Deteriorated With the new guidlines this patiet appears to be in the early stages of diabetes. I believe we can start early and maybe hold off the effects of her hyperglycemia.   Her updated medication list for this problem includes:    Metformin Hcl 500 Mg Tabs (Metformin hcl) .Marland Kitchen... Take one pill daily for 1 week, then advance to two pills daily.  Orders: Glucose Cap-[facility] (60454) [facility]- Est  Level 4 (09811)  Problem # 3:  OBESITY (ICD-278.00) Assessment: Unchanged PT is now 290.6 lbs. We discussed a healthy diet and stratigies for the upcoming holidays. I will see her back in 3 months to eval.   Orders: [facility]- Est  Level 4 (99214)  Problem # 4:  SCREENING FOR MALIGNANT NEOPLASM, COLON (ICD-V76.51) Assessment: Comment Only Pt refuses colonoscopy and prefers to do hemocult cards. She was given one today.   Orders: [facility]- Est  Level 4 (91478)  Complete Medication List: 1)  Furosemide 20 Mg Tabs  (Furosemide) .... One by mouth as needed for fluid overload and swelling. 2)  Advair Diskus 100-50 Mcg/dose Misc (Fluticasone-salmeterol) .... Inhale 1 puff as directed twice  a day 3)  Albuterol 90 Mcg/act Aers (Albuterol) .... Inhale 2 puff using inhaler every four hours 4)  Enalapril-hydrochlorothiazide 10-25 Mg Tabs (Enalapril-hydrochlorothiazide) .... One by mouth daily for blood pressure. 5)  Aspirin 81 Mg Tbec (Aspirin) .... One by mouth every day 6)  Metamucil 48.57 % Powd (Psyllium) 7)  Meclizine Hcl 25 Mg Tabs (Meclizine hcl) .... Take one pill twice daily for dizziness 8)  Metformin Hcl 500 Mg Tabs (Metformin hcl) .... Take one pill daily for 1 week, then advance to two pills daily.  Other Orders: A1C-[facility] (16109) Urinalysis-[facility] (00000) Hemoccult Cards (Take Home) (Hemoccult Cards)  Patient Instructions: 1)  You have the beginnings of diabetes.  2)  Exercise, eat low fat and low sugar foods.  3)  Get plenty of fiber. Beans are a good source of fiber. 4)  I will see you in 3 months for a recheck for diabetes and dizziness.  5)  Call me before then if you have any questions or concenrs.  Prescriptions: METFORMIN HCL 500 MG TABS (METFORMIN HCL) take one pill daily for 1 week, then advance to two pills daily.  #64 x 3   Entered and Authorized by:   Jamie Brookes MD   Signed by:   Jamie Brookes MD on 05/10/2009   Method used:   [facility]ron[facility] to        [facility] [phone]* (retail)       [address]       [place], [place]  [id number]       Ph: [phone] or [phone]       Fax: [phone]   RxID:   [id number] MECLIZINE HCL 25 MG TABS (MECLIZINE HCL) take one pill twice daily for dizziness  #40 x 1   Entered and Authorized by:   Jamie Brookes MD   Signed by:   Jamie Brookes MD on 05/10/2009   Method used:   [facility] to        [facility] [phone]* (retail)       [address]       [place], [place]  [id number]       Ph: [phone] or [phone]       Fax: [phone]   RxID:   [id number]   Laboratory Results   Urine Tests  Date/Time Received: May 10, 2009 10:57 AM  Date/Time Reported: May 10, 2009 11:15 AM   Routine Urinalysis   Color: yellow Appearance: Clear Glucose: negative   (Normal Range: Negative) Bilirubin: negative   (Normal Range: Negative) Ketone: negative   (Normal Range: Negative) Spec. Gravity: 1.020   (Normal Range: 1.003-1.035) Blood: trace-intact   (Normal Range: Negative) pH: 6.0   (Normal Range: 5.0-8.0) Protein: negative   (Normal Range: Negative) Urobilinogen: 0.2   (Normal Range: 0-1) Nitrite: negative   (Normal Range: Negative) Leukocyte Esterace: trace   (Normal Range: Negative)  Urine Microscopic WBC/HPF: 1-5 RBC/HPF: 0-3 Bacteria/HPF: trace Mucous/HPF: 2+ Epithelial/HPF: 5-15    Comments: ...............test performed by......Marland KitchenBonnie A. [place], MLS (ASCP)cm   Blood Tests   Date/Time Received: May 10, 2009 10:17 AM  Date/Time Reported: May 10, 2009 10:30 AM   HGBA1C: 6.0%   (Normal Range: Non-Diabetic - 3-6%   Control Diabetic - 6-8%)  Comments: ...........test performed by...........Marland KitchenTerese Door, CMA       Prevention & Chronic Care Immunizations  Influenza vaccine: Fluvax MCR  (03/08/2009)   Influenza vaccine due: 03/24/2008    Tetanus booster: 01/06/2006: Done.   Tetanus booster due: 01/07/2016    Pneumococcal vaccine: Done.  (01/06/2006)   Pneumococcal vaccine due: None    H. zoster vaccine: Not documented  Colorectal Screening   Hemoccult: negative  (07/09/2005)   Hemoccult action/deferral: Ordered  (05/10/2009)   Hemoccult due: 07/09/2006    Colonoscopy: Not documented   Colonoscopy action/deferral: Deferred  (05/10/2009)  Other Screening   Pap smear: Done.  (06/07/2004)   Pap smear action/deferral: Deferred  (05/10/2009)   Pap smear due: Refused   (01/27/2008)    Mammogram: normal  (08/16/2008)   Mammogram due: 08/16/2009    DXA bone density scan: Not documented   DXA bone density action/deferral: Not indicated  (05/10/2009)   Smoking status: quit  (05/10/2009)  Lipids   Total Cholesterol: 154  (01/19/2009)   LDL: 98  (01/19/2009)   LDL Direct: Not documented   HDL: 45  (01/19/2009)   Triglycerides: 55  (01/19/2009)  Hypertension   Last Blood Pressure: 136 / 78  (05/10/2009)   Serum creatinine: 0.73  (01/19/2009)   Serum potassium 3.8  (01/19/2009)    Hypertension flowsheet reviewed?: Yes   Progress toward BP goal: Deteriorated  Self-Management Support :    Patient will work on the following items until the next clinic visit to reach self-care goals:     Medications and monitoring: take my medicines every day, bring all of my medications to every visit  (05/10/2009)     Activity: take a 30 minute walk every day, join a walking program  (05/10/2009)    Hypertension self-management support: Education handout, Written self-care plan  (05/10/2009)   Hypertension self-care plan printed.   Hypertension education handout printed   Nursing Instructions: Provide Hemoccult cards with instructions (see order)     Flowsheet View for Follow-up Visit    Weight:     290.6    Blood pressure:   136 / 78    Urine protein:       negative    Urine glucose:    negative    Urine nitrite:     negative the two rx above were verbally called to pharmacy because patient called stating pharmacy has not received. Theresia Lo RN  May 10, 2009 4:18 PM

## 2010-07-02 NOTE — Miscellaneous (Signed)
Summary: Orders Update  Clinical Lists Changes  Orders: Added new Test order of [facility]- Est Level  3 (99213) - Signed  

## 2010-07-02 NOTE — Assessment & Plan Note (Signed)
Summary: flu shot,df  Nurse Visit   Vital Signs:  Patient profile:   70 year old female Temp:     99 degrees F  Vitals Entered By: Theresia Lo RN (March 08, 2009 9:11 AM)  Allergies: No Known Drug Allergies  Immunizations Administered:  Influenza Vaccine # 1:    Vaccine Type: Fluvax MCR    Site: right deltoid    Mfr: GlaxoSmithKline    Dose: 0.5 ml    Route: IM    Given by: Theresia Lo RN    Exp. Date: 11/29/2009    Lot #: [id number]    VIS given: 01/09/2009  Flu Vaccine Consent Questions:    Do you have a history of severe allergic reactions to this vaccine? no    Any prior history of allergic reactions to egg and/or gelatin? no    Do you have a sensitivity to the preservative Thimersol? no    Do you have a past history of Guillan-Barre Syndrome? no    Do you currently have an acute febrile illness? no    Have you ever had a severe reaction to latex? no    Vaccine information given and explained to patient? yes    Are you currently pregnant? no  Orders Added: 1)  Influenza Vaccine MCR [00025] 2)  Administration Flu vaccine - MCR [G0008]

## 2010-07-02 NOTE — Assessment & Plan Note (Signed)
Summary: FU/KH   Vital Signs:  Patient Profile:   70 Years Old Female Weight:      291 pounds Pulse rate:   95 / minute BP sitting:   136 / 72  Vitals Entered By: Lillia Pauls CMA (September 04, 2006 2:09 PM)               Chief Complaint:  FU ;COLD AND COUGH X 1 WK.  History of Present Illness: One week ago runny nose and sore throat.  Nyquil makes her sleep.  Cough started last night.  Left knee is still stiff but not painful.  Trying to do exercises provided.       Review of Systems  ENT      Complains of nasal congestion and postnasal drainage.      Denies sinus pressure.  Resp      Complains of cough and wheezing.   Physical Exam  General:     Obese black female Ears:     R TM retraction and L TM retraction.   Neck:     No deformities, masses, or tenderness noted. Lungs:     Normal respiratory effort, chest expands symmetrically. Lungs are clear to auscultation, no crackles or wheezes. Heart:     Normal rate and regular rhythm. S1 and S2 normal without gallop, murmur, click, rub or other extra sounds.    Impression & Recommendations:  Problem # 1:  URI (ICD-465.9) Assessment: Comment Only Tussionex 1 tesp at bedtime , drink more fluids, and saline nasal spray.  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   Patient Instructions: 1)  Recommend saline nasal spray. 2)  Only use cough medicine at bedtime, may cause drowsiness 3)  Please schedule a follow-up appointment as needed.

## 2010-07-02 NOTE — Assessment & Plan Note (Signed)
   Allergies: No Known Drug Allergies   Complete Medication List: 1)  Furosemide 20 Mg Tabs (Furosemide) .... One by mouth as needed for fluid overload and swelling. 2)  Advair Diskus 100-50 Mcg/dose Misc (Fluticasone-salmeterol) .... Inhale 1 puff as directed twice a day 3)  Albuterol 90 Mcg/act Aers (Albuterol) .... Inhale 2 puff using inhaler every four hours 4)  Enalapril-hydrochlorothiazide 10-25 Mg Tabs (Enalapril-hydrochlorothiazide) .... One by mouth daily for blood pressure. 5)  Aspirin 81 Mg Tbec (Aspirin) .... One by mouth every day 6)  Metamucil 48.57 % Powd (Psyllium)

## 2010-07-02 NOTE — Progress Notes (Signed)
Summary: Rx Prob  Phone Note Call from Patient   Caller: Patient Summary of Call: Pt calling in to say that pharmacy says they do not have meds that Dr. Clotilde Dieter sent in today for her.  [facility] [address] Initial call taken by: Clydell Hakim,  May 10, 2009 3:50 PM  Follow-up for Phone Call        verbally called in RX. Follow-up by: Theresia Lo RN,  May 10, 2009 4:17 PM

## 2010-07-02 NOTE — Assessment & Plan Note (Signed)
Summary: FLU SHOT/KH  Nurse Visit    Prior Medications: FUROSEMIDE 20 MG  TABS (FUROSEMIDE) one by mouth prn ADVAIR DISKUS 100-50 MCG/DOSE MISC (FLUTICASONE-SALMETEROL) Inhale 1 puff as directed twice a day ALBUTEROL 90 MCG/ACT AERS (ALBUTEROL) Inhale 2 puff using inhaler every four hours ENALAPRIL-HYDROCHLOROTHIAZIDE 10-25 MG TABS (ENALAPRIL-HYDROCHLOROTHIAZIDE) one by mouth daily ASPIRIN 81 MG  TBEC (ASPIRIN) one by mouth every day    Influenza Vaccine    Vaccine Type: Fluvax 3+    Site: left deltoid    Mfr: GlaxoSmithKline    Dose: 0.5 ml    Route: IM    Given by: JESSICA FLEEGER CMA    Exp. Date: 11/29/2008    Lot #: [id number]    VIS given: 12/24/06 version given March 06, 2008.  Flu Vaccine Consent Questions    Do you have a history of severe allergic reactions to this vaccine? no    Any prior history of allergic reactions to egg and/or gelatin? no    Do you have a sensitivity to the preservative Thimersol? no    Do you have a past history of Guillan-Barre Syndrome? no    Do you currently have an acute febrile illness? no    Have you ever had a severe reaction to latex? no    Vaccine information given and explained to patient? yes    Are you currently pregnant? no   Orders Added: 1)  Flu Vaccine 66yrs + [90658] 2)  Admin 1st Vaccine [90471] 3)  Est Level 1- [facility] [09811]    ]

## 2010-07-02 NOTE — Assessment & Plan Note (Signed)
Summary: stasis dermatitis   Vital Signs:  Patient profile:   70 year old female Height:      66 inches Weight:      297.8 pounds Pulse rate:   72 / minute BP sitting:   138 / 75  (left arm) Cuff size:   large  Vitals Entered By: Gladstone Pih (Oct 25, 2009 10:43 AM) CC: Chronic Venous sTasis dermatitis   Primary Care Provider:  Jamie Brookes MD  CC:  Chronic Venous sTasis dermatitis.  History of Present Illness: Chronic Venous Stasis dermatitis:  Pt has the above and her friends and family are worried about them. She is coming in today to see if there is anything she can do to get her legs to look better. I discussed with the patient that the hyperpigmentation is permanent and that we needed to focus on preventing it from worsening. We discussed how she needs to exercise her legs to keep the blood flowing and to keep her legs elevated at night. No shortness of breath.   Current Medications (verified): 1)  Furosemide 40 Mg Tabs (Furosemide) .... Take One Pill Daily For Leg Swelling 2)  Advair Diskus 100-50 Mcg/dose Misc (Fluticasone-Salmeterol) .... Inhale 1 Puff As Directed Twice A Day 3)  Albuterol 90 Mcg/act Aers (Albuterol) .... Inhale 2 Puff Using Inhaler Every Four Hours 4)  Enalapril-Hydrochlorothiazide 10-25 Mg Tabs (Enalapril-Hydrochlorothiazide) .... One By Mouth Daily For Blood Pressure. 5)  Aspirin 81 Mg  Tbec (Aspirin) .... One By Mouth Every Day 6)  Colace 100 Mg Caps (Docusate Sodium) .... 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 We discussed eating healthfully, walking to increase her circulation and elevating her legs. We also discussed using Lasix to help keep the fluid  off. Plan to prescribe Lasix today. I will see her again in August.   Orders: [facility]- Est Level  3 (84166)  Complete Medication List: 1)  Furosemide 40 Mg Tabs (Furosemide) .... Take one pill daily for leg swelling 2)  Advair Diskus 100-50 Mcg/dose Misc (Fluticasone-salmeterol) .... Inhale 1 puff as directed twice a day 3)  Albuterol 90 Mcg/act Aers (Albuterol) .... Inhale 2 puff using inhaler every four hours 4)  Enalapril-hydrochlorothiazide 10-25 Mg Tabs (Enalapril-hydrochlorothiazide) .... One by mouth daily for blood pressure. 5)  Aspirin 81 Mg Tbec (Aspirin) .... One by mouth every day 6)  Colace 100 Mg Caps (Docusate sodium) .... Take 1 time a day  Other Orders: Glucose Cap-[facility] (06301) Glucose Cap-[facility] (60109)  Patient Instructions: 1)  Make an appointment to see me in August.  2)  We will check your blood sugar today.  3)  You have chronic venous stasis dermatitis.  4)  You need to walk and use the Furosemide to decrease fluid on your legs. Sleep with your legs elevated.  Prescriptions: FUROSEMIDE 40 MG TABS (FUROSEMIDE) take one pill daily for leg swelling  #31 x 6   Entered and Authorized by:   Jamie Brookes MD   Signed by:   Jamie Brookes MD on 10/25/2009   Method used:   [facility]        [facility] [phone][facility] (retail)  [address]       [place], [place]  [id number]       Ph: [phone] or [phone]       Fax: [phone]   RxID:   [id number]

## 2010-07-02 NOTE — Assessment & Plan Note (Signed)
Summary: hospital fu - shortness of breath wk   Vital Signs:  Patient Profile:   70 Years Old Female Weight:      286 pounds Pulse rate:   84 / minute BP sitting:   151 / 88  Vitals Entered By: Lillia Pauls CMA (September 24, 2006 9:53 AM)               Chief Complaint:  hosp fu.  History of Present Illness: Visited ER 1 week ago for asthma attack and started on prednisone.  Occurred at 6:30 AM and went to ER, used inhaler once and did not improve.  Peak flow was 150.       Review of Systems  General      Denies fever, loss of appetite, and sleep disorder.  ENT      Denies nasal congestion, postnasal drainage, and sinus pressure.  CV      Denies chest pain or discomfort.  Resp      Complains of cough, shortness of breath, and wheezing.  GI      Denies abdominal pain.  GU      Denies dysuria.   Physical Exam  General:     alert, morbidly obese.  Lungs:     Peak flow today 260, has noly done 300 at best.normal respiratory effort and no wheezes.   Heart:     Normal rate and regular rhythm. S1 and S2 normal without gallop, murmur, click, rub or other extra sounds. Extremities:     trace left pedal edema.      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  Continue as needed albuterol, do peak flow at home.  May stop Atrovent, as she does not have advanced COPD and she thinks it is too strong. Orders: [facility]- Est Level  3 (52841)    Patient Instructions: 1)  Please schedule a follow-up appointment in 1 month. 2)  Continue weight loss and exercise.  BP was a little high today, restrict salt.  May need to increase BP meds if it does not improve.

## 2010-07-02 NOTE — Progress Notes (Signed)
Summary: Advair from yesterdays appt  Phone Note Call from Patient Call back at Home Phone [phone]   Summary of Call: needs to speak with someone about advair from appt yesterday with Luretha Murphy - needs [place] to call insurance company stating we did write her this rx [phone] Initial call taken by: Haydee Salter,  September 25, 2006 9:24 AM  Follow-up for Phone Call        phone call to [employer] and they advise pt needs prior authorization for Advair. they will fax form. Follow-up by: Theresia Lo RN,  September 25, 2006 10:55 AM  Additional Follow-up for Phone Call Additional follow up Details #1::        form received and will give to Luretha Murphy to fill out Additional Follow-up by: Theresia Lo RN,  September 25, 2006 11:58 AM

## 2010-07-02 NOTE — Assessment & Plan Note (Signed)
Summary: check bp   Vital Signs:  Patient Profile:   70 Years Old Female Height:     66 inches Weight:      291.4 pounds BMI:     47.20 Temp:     98.4 degrees F oral Pulse rate:   78 / minute BP sitting:   121 / 63  (left arm) Cuff size:   large  Pt. in pain?   no  Vitals Entered By: Dedra Skeens CMA, (January 27, 2008 8:35 AM)                  Chief Complaint:  CHECK BP.  History of Present Illness: cc: f/u BP 1)HTN: Pt doing well.  Taking prescribed medications as indicated.  Denies CP, SOB, edema.  Denies HA, vision changes. 2)Itching: Occasional itching of upper arms.  Has gone on for many years. No rashes. Has not tried anything. does not bother her very  much 3)Ringing in R ear:  Has ringing in R ear that last a few minutes 1-2 times a day.  No pain.  No dizziness. Improved after using ear wax drops a few weeks ago.  No hearing problems.    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; She had 12 siblings but only one is left living        Plays Bingo   Risk Factors:  Mammogram History:     Date of Last Mammogram:  07/04/2007  Mammogram History:     Date of Last Mammogram:  07/04/2007    Results:  verbally report by pt normal   Mammogram History:     Date of Last Mammogram:  07/04/2007   Mammogram History:   Date of Last Mammogram:  07/04/2007    Results:  verbally report by pt normal   Mammogram History:     Date of Last Mammogram:  07/04/2007   Mammogram History:     Date of Last Mammogram:  07/04/2007    Results:  verbally report by pt normal   Mammogram History:     Date of Last Mammogram:  07/04/2007   Mammogram History:     Date of Last Mammogram:  07/04/2007    Results:  verbally report by pt normal   Mammogram History:     Date of Last Mammogram:  07/04/2007   Mammogram History:     Date of Last Mammogram:  07/04/2007    Results:  verbally report by pt normal   Mammogram History:     Date of Last Mammogram:  07/04/2007  Mammogram History:     Date of Last Mammogram:  07/04/2007    Results:  verbally report by pt normal    Review of Systems      See HPI   Physical Exam  General:     obese, nad Ears:     External ear exam shows no significant lesions or deformities.  Otoscopic examination reveals clear canals, tympanic membranes are intact bilaterally without bulging, retraction, inflammation or discharge. Hearing is grossly normal bilaterally. Lungs:     Normal respiratory effort, chest expands symmetrically. Lungs are clear to auscultation, no crackles or wheezes. Heart:     Normal rate and regular rhythm. S1 and S2 normal without gallop, murmur, click, rub or other extra sounds. Pulses:     normal radial and dp pulses Extremities:     no edema Skin:     Intact without suspicious lesions or rashes    Impression & Recommendations:  Problem # 1:  HYPERTENSION, BENIGN ESSENTIAL (ICD-401.1) Assessment: Unchanged At goal.  Refilled meds.  FLP today Her updated medication list for this problem includes:    Furosemide 20 Mg Tabs (Furosemide) ..... One by mouth prn    Enalapril-hydrochlorothiazide 10-25 Mg Tabs (Enalapril-hydrochlorothiazide) ..... One by mouth daily  Orders: [facility] ([address]454) [facility]- Est  Level 4 (99214)  BP today: 121/63 Prior  BP: 128/82 (03/30/2007)  Labs Reviewed: Creat: 0.73 (03/30/2007)   Problem # 2:  PRURITUS (ICD-698.9) Assessment: New Not very bothersome. NOrmal exam.  Benadryl prn Orders: [facility]- Est  Level 4 (99214)   Problem # 3:  TINNITUS (ICD-388.30) Assessment: New Unclear etiolgy.  will follow for now. Orders: [facility]- Est  Level 4 (09811)   Problem # 4:  OBESITY (ICD-278.00) Assessment: Improved Exercising w/ Senior citizen group. Encouraged to continue.  Discussed healthy eating habits w/ pt Orders: [facility]- Est  Level 4 (91478)   Problem # 5:  Preventive Health Care (ICD-V70.0) Assessment: Unchanged Declines pap verbally states up to date on mammogram and it was normal  Complete Medication List: 1)  Furosemide 20 Mg Tabs (Furosemide) .... One by mouth prn 2)  Advair Diskus 100-50 Mcg/dose Misc (Fluticasone-salmeterol) .... Inhale 1 puff as directed twice a day 3)  Albuterol 90 Mcg/act Aers (Albuterol) .... Inhale 2 puff using inhaler every four hours 4)  Enalapril-hydrochlorothiazide 10-25 Mg Tabs (Enalapril-hydrochlorothiazide) .... One by mouth daily 5)  Aspirin 81 Mg Tbec (Aspirin) .... One by mouth every day   Patient Instructions: 1)  Please schedule a follow-up appointment in 6 months. 2)  Your blood pressure looks great! Keep up the good work avoiding salty foods.  Keep exercising. 3)  [facility] 4)  Try Benadryl for itching if needed. 5)  I will send a letter or call you about your cholesterol 6)  Good luck with Bingo!!!   Prescriptions: ENALAPRIL-HYDROCHLOROTHIAZIDE 10-25 MG TABS (ENALAPRIL-HYDROCHLOROTHIAZIDE) one by mouth daily  #31 x 11   Entered and Authorized by:   Johney Maine MD   Signed by:   Johney Maine MD on 01/27/2008   Method used:   [facility]        [facility]  [address] [phone][facility] (retail)       [address]       [place], [place]  [address]       Ph: [phone] or  [phone]       Fax: [phone]   RxID:  [id number] FUROSEMIDE 20 MG  TABS (FUROSEMIDE) one by mouth prn  #30 x 3   Entered and Authorized by:   Johney Maine MD   Signed by:   Johney Maine MD on 01/27/2008   Method used:   [facility]        [facility]  [address] [phone]* (retail)       [address]       [place], [place]  [address]       Ph: [phone] or [phone]       Fax: [phone]   RxID:   [id number]  ] Last PAP:  Done. (06/07/2004 12:00:00 AM) PAP Next Due:  Refused Last Mammogram:  Done. (07/09/2005 12:00:00 AM) Mammogram Result Date:  07/04/2007 Mammogram Result:  verbally report by pt normal Mammogram Next Due:  1 yr

## 2010-07-02 NOTE — Assessment & Plan Note (Signed)
Summary: f/u visit/bmc   Vital Signs:  Patient Profile:   70 Years Old Female Height:     66 inches Weight:      292.9 pounds BMI:     47.45 Temp:     97.9 degrees F oral Pulse rate:   80 / minute BP sitting:   127 / 77  (left arm) Cuff size:   large  Pt. in pain?   no  Vitals Entered By: Dedra Skeens CMA, (July 28, 2008 9:56 AM)                  Chief Complaint:  f/u bp.  History of Present Illness: 1)htn: Pt doing well.  Taking prescribed medications as indicated.  Denies chest pain, shortness of breath,  edema.  Denies HA, vision changes. no refills needed 2)Obesity:  has not been going to exercise class (silver sneakers). Will start soon. 3)asthma:  only using adair once a day.  does not use rescue inhaler.  peak flows over 300.  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 Lungs are clear to auscultation, no crackles or wheezes. Heart:     Normal rate and regular rhythm. S1 and S2 normal without gallop, murmur, click, rub or other extra sounds. Extremities:     trace edema L lower extrem. chronic skin changes, w/ hyperpigmented, think skin of shin  r LE: no edema, no skin changes    Impression & Recommendations:  Problem # 1:  HYPERTENSION, BENIGN ESSENTIAL (ICD-401.1) Assessment: Unchanged controlled. no changes.  Her updated medication list for this problem includes:    Furosemide  20 Mg Tabs (Furosemide) ..... One by mouth prn    Enalapril-hydrochlorothiazide 10-25 Mg Tabs (Enalapril-hydrochlorothiazide) ..... One by mouth daily  Orders: [facility]- Est Level  3 (99213)  BP today: 127/77 Prior BP: 121/63 (01/27/2008)  Labs Reviewed: Creat: 0.73 (03/30/2007) Chol: 154 (01/27/2008)   HDL: 49 (01/27/2008)   LDL: 93 (01/27/2008)   TG: 59 (01/27/2008)   Problem # 2:  OBESITY (ICD-278.00) Assessment: Unchanged spent time counseling on healthy eating habits and exercise.  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   Her updated medication list for this problem includes:    Aspirin 81 Mg Tbec (Aspirin) ..... One by mouth every day   Complete Medication List: 1)  Furosemide 20 Mg Tabs (Furosemide) .... One by mouth prn 2)  Advair Diskus 100-50 Mcg/dose Misc (Fluticasone-salmeterol) .... Inhale 1 puff as directed twice a day 3)  Albuterol 90 Mcg/act Aers (Albuterol) .... Inhale 2 puff using  inhaler every four hours 4)  Enalapril-hydrochlorothiazide 10-25 Mg Tabs (Enalapril-hydrochlorothiazide) .... One by mouth daily 5)  Aspirin 81 Mg Tbec (Aspirin) .... One by mouth every day 6)  Metamucil 48.57 % Powd (Psyllium)   Patient Instructions: 1)  Please schedule a follow-up appointment in 6 months. 2)  KEEP UP THE GREAT WORK WITH YOUR BLOOD PRESSURE 3)  YOU CAN USE EXTRA STRENGTH TYLENOL FOR YOUR KNEE PAIN 4)  Some ways to exercise include:  Walking, doing housework such as vacuuming for over 30 minutes, parking far away from stores, playing with pets outside for 30 minutes, playing tag with children 5)  Foods that are high in salt (sodium, Na):  canned foods, bacon, lunch/deli meats (packaged meats), chips, canned soup, hot dogs, soy sauce, gravy.  Eat less than 1500-2000mg  of Na a day.  Look for "Na"  and number on packages

## 2010-07-02 NOTE — Assessment & Plan Note (Signed)
Summary: flu shot,tcb  Nurse Visit   Vital Signs:  Patient profile:   70 year old female Temp:     98.3 degrees F  Vitals Entered By: Theresia Lo RN (March 19, 2010 9:37 AM)  Allergies: No Known Drug Allergies  Immunizations Administered:  Influenza Vaccine # 1:    Vaccine Type: Fluvax MCR    Site: right deltoid    Mfr: GlaxoSmithKline    Dose: 0.5 ml    Route: IM    Given by: Theresia Lo RN    Exp. Date: 11/27/2010    Lot #: [id number]    VIS given: 12/25/2009  Flu Vaccine Consent Questions:    Do you have a history of severe allergic reactions to this vaccine? no    Any prior history of allergic reactions to egg and/or gelatin? no    Do you have a sensitivity to the preservative Thimersol? no    Do you have a past history of Guillan-Barre Syndrome? no    Do you currently have an acute febrile illness? no    Have you ever had a severe reaction to latex? no    Vaccine information given and explained to patient? yes    Are you currently pregnant? no  Orders Added: 1)  Influenza Vaccine MCR [00025] 2)  Administration Flu vaccine - MCR [G0008]

## 2010-07-02 NOTE — Letter (Signed)
Summary: Results Follow-up Letter  [facility]  [address]   [place], [place] [address]   Phone: [phone]  Fax: [phone]    02/03/2008  [address] [place], [place]  [address]  Dear Ms. Bomkamp,   The following are the results of your recent test(s):         Comments: _________________________________________________________ Cholesterol LDL(Bad cholesterol):    93      Your goal is less than: 100        HDL (Good cholesterol):  49      Your goal is more than: 50 _________________________________________________________ Other Tests:   _________________________________________________________  Please call for an appointment Or _________________________________________________________ _________________________________________________________ _________________________________________________________  Sincerely,  Johney Maine MD [facility]           Appended Document: Results Follow-up Letter sent

## 2010-07-04 NOTE — Miscellaneous (Signed)
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

## 2010-07-04 NOTE — Assessment & Plan Note (Signed)
Summary: leg numbness,df   Vital Signs:  Patient profile:   70 year old female Height:      66 inches Weight:      291.2 pounds BMI:     47.17 Temp:     97.9 degrees F oral Pulse rate:   84 / minute BP sitting:   126 / 76  (left arm) Cuff size:   large  Vitals Entered By: Garen Grams LPN (June 18, 2010 10:58 AM) CC: left leg numbness x 2 days Is Patient Diabetic? No Pain Assessment Patient in pain? no        Primary Care Provider:  Jamie Brookes MD  CC:  left leg numbness x 2 days.  History of Present Illness: 70 yo female with 2d of leg pain.  C/o sensation of L leg below knee going to sleep.  No precipitating or palliating factors.  Overall getting better.  Paresthesias described along medial great toe.  She also has some swelling of the LLE below the knee, fullness/catching sensation in the popliteal fossa, no SOB.  She does have a Hx DVT and was on coumadin for this in the past. Uses tylenol which helps her pain.  Does not use any form of compression.  Habits & Providers  Alcohol-Tobacco-Diet     Tobacco Status: never  Current Medications (verified): 1)  Furosemide 40 Mg Tabs (Furosemide) .... Take One Pill Every Morning and Evening For Leg Swelling and Blood Pressure 2)  Advair Diskus 100-50 Mcg/dose Misc (Fluticasone-Salmeterol) .... Inhale 1 Puff As Directed Twice A Day 3)  Albuterol 90 Mcg/act Aers (Albuterol) .... Inhale 2 Puff Using Inhaler Every Four Hours 4)  Enalapril-Hydrochlorothiazide 10-25 Mg Tabs (Enalapril-Hydrochlorothiazide) .... One By Mouth Daily For Blood Pressure. 5)  Aspirin 81 Mg  Tbec (Aspirin) .... One By Mouth Every Day 6)  Colace 100 Mg Caps (Docusate Sodium) .... Take 1 Time A Day 7)  Amoxicillin 500 Mg Caps (Amoxicillin) .... One Three Times A Day For 7 Days  Allergies (verified): No Known Drug Allergies  Review of Systems       See HPI  Physical Exam  General:  Well-developed,well-nourished,in no acute distress;  alert,appropriate and cooperative throughout examination Lungs:  Normal respiratory effort, chest expands symmetrically. Lungs are clear to auscultation, no crackles or wheezes. Heart:  Normal rate and regular rhythm. S1 and S2 normal without gallop, murmur, click, rub or other extra sounds. Msk:  Both LE with significant hemosiderin on bilateral ankles. LLE swollen and significantly larger than R, pt claims this is chronic. She has severel b/l pes planus. Negative tarsal tunnel tinels. No other local areas of tenderness. Neg ankle anterior drawer. Neg Kleigers. No N-spot tenderness. Pulses palpable b/l Additional Exam:  Placed MT cookies under longitudinal arch in her shoes.  This partially corrects her pes planus. Arch straps placed.   Impression & Recommendations:  Problem # 1:  LEG PAIN, LEFT (ICD-729.5) Assessment New DVT vs bakers cyst vs tarsal tunnel syndrome 2/2 pes planus. Checking LE dopplers, if DVT, would need lifelong anticoag for 2nd DVT. See below.  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 Partially corrected with MT cookies (no scaphoid pads available at [facility]) This corrected her gait (she felt significantly inproved and also claims the pulling behind her knee is resolved)  Arch pads should help as well. RTC 1-2 weeks as needed, if no better would need custom orthotics.  Orders: [facility]- Est  Level 4 (99214) Metatarsal Cookies - [facility] (L3080) Foot Orthosis ( Arch Strap/Heel Cup) [phone])  Problem # 3:  STASIS DERMATITIS, CHRONIC (ICD-459.81) Assessment: Unchanged Advised compression hose of some type.  Complete Medication List: 1)  Furosemide 40 Mg Tabs (Furosemide) .... Take one pill every morning and evening for leg swelling and blood pressure 2)  Advair Diskus  100-50 Mcg/dose Misc (Fluticasone-salmeterol) .... Inhale 1 puff as directed twice a day 3)  Albuterol 90 Mcg/act Aers (Albuterol) .... Inhale 2 puff using inhaler every four hours 4)  Enalapril-hydrochlorothiazide 10-25 Mg Tabs (Enalapril-hydrochlorothiazide) .... One by mouth daily for blood pressure. 5)  Aspirin 81 Mg Tbec (Aspirin) .... One by mouth every day 6)  Colace 100 Mg Caps (Docusate sodium) .... Take 1 time a day 7)  Amoxicillin 500 Mg Caps (Amoxicillin) .... One three times a day for 7 days   Orders Added: 1)  [facility]- Est  Level 4 [99214] 2)  Metatarsal Cookies - [facility] [L3080] 3)  Foot Orthosis ( Arch Strap/Heel Cup) [W1191] 4)  LE Venous Duplex (DVT) [DVT]

## 2010-07-04 NOTE — Assessment & Plan Note (Signed)
Summary: sore throat/eo   Vital Signs:  Patient profile:   70 year old female Height:      66 inches Weight:      294.4 pounds Temp:     97.9 degrees F oral Pulse rate:   88 / minute BP sitting:   137 / 75  (left arm)  Vitals Entered By: Jimmy Footman, CMA (June 04, 2010 2:23 PM) CC: sore throat x4 days Is Patient Diabetic? No   Primary Care Provider:  Jamie Brookes MD  CC:  sore throat x4 days.  History of Present Illness: Sore throat and cough for 4 days, worse at night.  History of asthma.  Not coughing so bad.  Head ache worse today.  Exposed to school age children often.  Habits & Providers  Alcohol-Tobacco-Diet     Tobacco Status: never  Current Medications (verified): 1)  Furosemide 40 Mg Tabs (Furosemide) .... Take One Pill Every Morning and Evening For Leg Swelling and Blood Pressure 2)  Advair Diskus 100-50 Mcg/dose Misc (Fluticasone-Salmeterol) .... Inhale 1 Puff As Directed Twice A Day 3)  Albuterol 90 Mcg/act Aers (Albuterol) .... Inhale 2 Puff Using Inhaler Every Four Hours 4)  Enalapril-Hydrochlorothiazide 10-25 Mg Tabs (Enalapril-Hydrochlorothiazide) .... One By Mouth Daily For Blood Pressure. 5)  Aspirin 81 Mg  Tbec (Aspirin) .... One By Mouth Every Day 6)  Colace 100 Mg Caps (Docusate Sodium) .... Take 1 Time A Day 7)  Amoxicillin 500 Mg Caps (Amoxicillin) .... One Three Times A Day For 7 Days  Allergies (verified): No Known Drug Allergies  Social History: Smoking Status:  never  Review of Systems       The patient complains of headaches.  The patient denies anorexia, fever, and prolonged cough.    Physical Exam  General:  Well appearing elderly AA female Ears:  TM retracted and injected Nose:  congested Mouth:  thick yellow post nasal drainage Lungs:  clear Heart:  rrr   Impression & Recommendations:  Problem # 1:  SINUSITIS, ACUTE (ICD-461.9)  fluids, saline nasal spray, take all of medicaion Her updated medication list for this  problem includes:    Amoxicillin 500 Mg Caps (Amoxicillin) ..... One three times a day for 7 days  Orders: [facility]- Est Level  3 ([address]109)  Complete Medication List: 1)  Furosemide 40 Mg Tabs (Furosemide) .... Take one pill every morning and evening for leg swelling and blood pressure 2)  Advair Diskus 100-50 Mcg/dose Misc (Fluticasone-salmeterol) .... Inhale 1 puff as directed twice a day 3)  Albuterol 90 Mcg/act Aers (Albuterol) .... Inhale 2 puff using inhaler every four hours 4)  Enalapril-hydrochlorothiazide 10-25 Mg Tabs (Enalapril-hydrochlorothiazide) .... One by mouth daily for blood pressure. 5)  Aspirin 81 Mg Tbec (Aspirin) .... One by mouth every day 6)  Colace 100 Mg Caps (Docusate sodium) .... Take 1 time a day 7)  Amoxicillin 500 Mg Caps (Amoxicillin) .... One three times a day for 7 days  Patient Instructions: 1)  Drink a lot of fluids 2)  wash hands often 3)  take all of the medicine prescribed Prescriptions: AMOXICILLIN 500 MG CAPS (AMOXICILLIN) one three times a day for 7 days  #21 x 0   Entered and Authorized by:   Luretha Murphy NP   Signed by:   Luretha Murphy NP on 06/04/2010   Method used:   [facility]  [address] [phone][facility] (retail)       [address]  [address]       [place], [place]  [id number]       Ph: [phone] or [phone]       Fax: [phone]   RxID:   [id number]    Orders Added: 1)  [facility]- Est Level  3 [[address]073]

## 2010-07-10 NOTE — Assessment & Plan Note (Signed)
Summary: pes planus, HTN, chronic venous stasis   Vital Signs:  Patient profile:   70 year old female Height:      66 inches Weight:      291.5 pounds BMI:     47.22 Temp:     98.7 degrees F oral Pulse rate:   74 / minute BP sitting:   144 / 75  (left arm) Cuff size:   large  Vitals Entered By: Jimmy Footman, CMA (July 01, 2010 1:54 PM) CC: follow-up visit foot/knee pain, HTN   Primary Care Provider:  Jamie Brookes MD  CC:  follow-up visit foot/knee pain and HTN.  History of Present Illness: Foot/Knee pain: PT is doing well with the MT cookies Dr. Karie Schwalbe put in her shoes at the last visit. We dsicussed orthotics today.  No numbness in her feet, occasional pain in her left knee but it is much better than before. Pt was given arch straps as well and she is doing well with them.   hypertension: Pt was elevated today and says seh is taking all her meds as precribed. looking back her BP has been good the last 2 times she was at our clinic.   Habits & Providers  Alcohol-Tobacco-Diet     Tobacco Status: never  Current Medications (verified): 1)  Furosemide 40 Mg Tabs (Furosemide) .... Take One Pill Every Morning and Evening For Leg Swelling and Blood Pressure 2)  Advair Diskus 100-50 Mcg/dose Misc (Fluticasone-Salmeterol) .... Inhale 1 Puff As Directed Twice A Day 3)  Albuterol 90 Mcg/act Aers (Albuterol) .... Inhale 2 Puff Using Inhaler Every Four Hours 4)  Enalapril-Hydrochlorothiazide 10-25 Mg Tabs (Enalapril-Hydrochlorothiazide) .... One By Mouth Daily For Blood Pressure. 5)  Aspirin 81 Mg  Tbec (Aspirin) .... One By Mouth Every Day 6)  Colace 100 Mg Caps (Docusate Sodium) .... Take 1 Time A Day 7)  Amoxicillin 500 Mg Caps (Amoxicillin) .... One Three Times A Day For 7 Days  Allergies (verified): No Known Drug Allergies  Review of Systems        vitals reviewed and pertinent negatives and positives seen in HPI   Physical Exam  General:  Well-developed,well-nourished,in  no acute distress; alert,appropriate and cooperative throughout examination Extremities:  warm toes, arch straps on, lower extremity edema and skin thickening.    Impression & Recommendations:  Problem # 1:  PES PLANUS (ICD-734) Assessment Improved Improved. Discussed custom orthotic vs Superfeet. Pt opted for Superfeet and will buy them. She will call if they do not help and get an appt to [facility].   Orders: [facility]- Est Level  3 (16109)  Problem # 2:  HYPERTENSION, BENIGN ESSENTIAL (ICD-401.1) Assessment: Deteriorated Pt has slightlyu elevated BP today, but looking back it was normal the last few times. Will cont to monitor. If anything changeds, will add new med.   Orders: [facility]- Est Level  3 (60454)  Her updated medication list for this problem includes:    Furosemide 40 Mg Tabs (Furosemide) .Marland Kitchen... Take one pill every morning and evening for leg swelling and blood pressure    Enalapril-hydrochlorothiazide 10-25 Mg Tabs (Enalapril-hydrochlorothiazide) ..... One by mouth daily for blood pressure.  Problem # 3:  STASIS DERMATITIS, CHRONIC (ICD-459.81) Assessment: Unchanged Pt will be mailed a Rx for compression hose for this condition.   Orders: [facility]- Est Level  3 (09811)  Complete Medication List: 1)  Furosemide 40 Mg Tabs (Furosemide) .... Take one pill every morning and evening for leg swelling and blood pressure 2)  Advair  Diskus 100-50 Mcg/dose Misc (Fluticasone-salmeterol) .... Inhale 1 puff as directed twice a day 3)  Albuterol 90 Mcg/act Aers (Albuterol) .... Inhale 2 puff using inhaler every four hours 4)  Enalapril-hydrochlorothiazide 10-25 Mg Tabs (Enalapril-hydrochlorothiazide) .... One by mouth daily for blood pressure. 5)  Aspirin 81 Mg Tbec (Aspirin) .... One by mouth every day 6)  Colace 100 Mg Caps (Docusate sodium) .... Take 1 time a day 7)  Amoxicillin 500 Mg Caps (Amoxicillin) .... One three times a day for 7 days  Patient Instructions: 1)  The product name is  Superfeet. The stroe that caries them is [employer] in the [facility] (new section where the [facility] is).  2)  Ask them to help you pick out a pair to help with your arches.    Orders Added: 1)  [facility]- Est Level  3 [16109]

## 2010-08-06 ENCOUNTER — Encounter: Payer: Self-pay | Admitting: Family Medicine

## 2010-08-06 ENCOUNTER — Ambulatory Visit (INDEPENDENT_AMBULATORY_CARE_PROVIDER_SITE_OTHER): Payer: Medicare Other | Admitting: Family Medicine

## 2010-08-06 VITALS — BP 133/71 | HR 68 | Temp 97.6°F | Wt 294.1 lb

## 2010-08-06 DIAGNOSIS — I872 Venous insufficiency (chronic) (peripheral): Secondary | ICD-10-CM

## 2010-08-06 DIAGNOSIS — J45909 Unspecified asthma, uncomplicated: Secondary | ICD-10-CM

## 2010-08-06 DIAGNOSIS — R7309 Other abnormal glucose: Secondary | ICD-10-CM

## 2010-08-06 DIAGNOSIS — Z8672 Personal history of thrombophlebitis: Secondary | ICD-10-CM

## 2010-08-06 LAB — POCT GLYCOSYLATED HEMOGLOBIN (HGB A1C): Hemoglobin A1C: 6.3

## 2010-08-06 MED ORDER — ALBUTEROL 90 MCG/ACT IN AERS: 2.0000 | INHALATION_SPRAY | RESPIRATORY_TRACT | Status: DC

## 2010-08-06 NOTE — Patient Instructions (Signed)
Wear the stockings on the plane and do little exercises with your legs We are testing your glucose level over the last 3 months with an A1c today.  If your blood sugar has been high we may need to talk about doing a different medicine for sugar control.  Have a fun time with your son.

## 2010-08-11 MED ORDER — METFORMIN HCL 500 MG PO TABS: 500.0000 mg | ORAL_TABLET | Freq: Two times a day (BID) | ORAL | Status: DC

## 2010-08-11 NOTE — Assessment & Plan Note (Signed)
Discussed exercises she could do while on the pain and using a band to do exercises as well. Pt will wear her compression hose while on the flight and walking around as well.

## 2010-08-11 NOTE — Assessment & Plan Note (Signed)
Pt's A1c is 6.3. She does not appear to be on any medication. We will need to discuss this further when she gets back from her trip. I think she needs to start Metformin now and will call in some medication for her to begin when she gets back. Then will have her go to diabetic teaching classes.

## 2010-08-11 NOTE — Assessment & Plan Note (Signed)
Plan to have the patient continue to wear the stockings and keep them on during her trip. Asked her to also do exercises that I showed her during the flight. (foot pointing exercises).

## 2010-08-11 NOTE — Progress Notes (Signed)
Pt has recently gotten her stockings for her edema and is wearing them today. She wants to go to see her son in [place] and is concerned about the long flight. She wants to know what she can do to decrease her risk of more edema and a DVT.   Hyperglycemia: Pt says that her blood sugars have been running high at the very end of the visit. She says she has had some running in the 300-350 category. According to our files the patient is not on a medication. PT's A1c today is 6.3.

## 2010-08-12 ENCOUNTER — Telehealth: Payer: Self-pay | Admitting: *Deleted

## 2010-08-12 NOTE — Telephone Encounter (Signed)
Erin Esparza, Spoke with patient and she states that she never had a conversation with you saying her sugar was high. She states that you called her last week and said everything was good and that she is not needing to be on any medicine. I had informed her that she was dx with hyperglycemia in 05/2009 and was on metformin. She believes that she is being "mixed" up with another patient and would like you to call her please.

## 2010-08-14 NOTE — Telephone Encounter (Signed)
I just tried to call the patient but she is at a dental appointment. Can you try to call her later and let her know that I did diagnose her with hyperglycemia on 05-10-2009 and that we tried Metformin at that time and since it gave her some diarrhea she stopped it. I now tested her A1c which is a measurement of how high the blood glucose has been over the last 3 months and it indicated that her blood glucose has been high. Her A1c is 6.3 and at 6.5 we officially say someone has diabetes according to the new ADA guidelines. She is in the pre-diabetes category and needs to either give the Metformin another good try by starting at half the dose and slowly increasing it, or she needs to have a major lifestyle and food change with regular vigorous exercise at least 3-5 days a week.   If she still has questions please let me know.

## 2010-08-19 LAB — GLUCOSE, CAPILLARY: Glucose-Capillary: 98 mg/dL (ref 70–99)

## 2010-08-28 ENCOUNTER — Ambulatory Visit: Payer: Medicare Other

## 2010-09-03 ENCOUNTER — Encounter: Payer: Self-pay | Admitting: Family Medicine

## 2010-09-03 ENCOUNTER — Ambulatory Visit (INDEPENDENT_AMBULATORY_CARE_PROVIDER_SITE_OTHER): Payer: Medicare Other | Admitting: Family Medicine

## 2010-09-03 VITALS — BP 118/72 | HR 68 | Temp 98.1°F | Ht 66.6 in | Wt 288.0 lb

## 2010-09-03 DIAGNOSIS — R7309 Other abnormal glucose: Secondary | ICD-10-CM

## 2010-09-03 DIAGNOSIS — E669 Obesity, unspecified: Secondary | ICD-10-CM

## 2010-09-03 LAB — LIPID PANEL
Cholesterol: 176 mg/dL (ref 0–200)
HDL: 44 mg/dL (ref >39)
LDL Cholesterol: 119 mg/dL — ABNORMAL HIGH (ref 0–99)
Total CHOL/HDL Ratio: 4 Ratio
Triglycerides: 67 mg/dL (ref <150)
VLDL: 13 mg/dL (ref 0–40)

## 2010-09-03 LAB — GLUCOSE, CAPILLARY: Glucose-Capillary: 95 mg/dL (ref 70–99)

## 2010-09-03 NOTE — Assessment & Plan Note (Signed)
Pt has a hemoglobin A1c which has been creeping up over the last few years. Plan to treat her with Metformin to keep her blood sugar from getting any higher.

## 2010-09-03 NOTE — Patient Instructions (Signed)
Start the Metformin slow and low. And in 2 weeks you should be taking it once every morning and once every evening. We will leave it there for the next 2 months.  Come back around June 3ish to get your A1c rechecked.  We are checking your fasting cholesterol levels today.

## 2010-09-03 NOTE — Progress Notes (Signed)
Pre-Diabetes: Pt has an A1c which has been progressively getting higher. Her last 3 test have been 6.0 - 6.1 - 6.3(most recent). She has tried Metformin before and wasn't able to tolerate it well but at this time I think she should try it again to keep her blood glucose from getting any higher. She does not want to go to the diabetes class. She does not have a reliable transportation.   ROS: neg no fevers, no complaints of SOB or excessive thirst.   PE: GEN: NAD, sitting comfrotably. HEENT: [place]/AT, EOMI, PERR, no external deformities of nose or ears.  Psych ;normal, no mood abnormalities.

## 2010-09-04 ENCOUNTER — Encounter: Payer: Self-pay | Admitting: Home Health Services

## 2010-09-04 ENCOUNTER — Telehealth: Payer: Self-pay | Admitting: Family Medicine

## 2010-09-04 LAB — URINALYSIS, ROUTINE W REFLEX MICROSCOPIC
Bilirubin Urine: NEGATIVE
Glucose, UA: NEGATIVE mg/dL
Hgb urine dipstick: NEGATIVE
Ketones, ur: NEGATIVE mg/dL
Nitrite: NEGATIVE
Protein, ur: NEGATIVE mg/dL
Specific Gravity, Urine: 1.012 (ref 1.005–1.030)
Urobilinogen, UA: 1 mg/dL (ref 0.0–1.0)
pH: 5.5 (ref 5.0–8.0)

## 2010-09-04 LAB — POCT CARDIAC MARKERS
CKMB, poc: 1.7 ng/mL (ref 1.0–8.0)
Myoglobin, poc: 81.7 ng/mL (ref 12–200)
Troponin i, poc: <0.05 ng/mL (ref 0.00–0.09)

## 2010-09-04 LAB — CBC
HCT: 38.6 % (ref 36.0–46.0)
Hemoglobin: 13.1 g/dL (ref 12.0–15.0)
MCHC: 33.8 g/dL (ref 30.0–36.0)
MCV: 92.6 fL (ref 78.0–100.0)
Platelets: 246 10*3/uL (ref 150–400)
RBC: 4.17 MIL/uL (ref 3.87–5.11)
RDW: 13.8 % (ref 11.5–15.5)
WBC: 10 10*3/uL (ref 4.0–10.5)

## 2010-09-04 LAB — DIFFERENTIAL
Basophils Absolute: 0 10*3/uL (ref 0.0–0.1)
Basophils Relative: 0 % (ref 0–1)
Eosinophils Absolute: 0.4 10*3/uL (ref 0.0–0.7)
Eosinophils Relative: 4 % (ref 0–5)
Lymphocytes Relative: 14 % (ref 12–46)
Lymphs Abs: 1.4 10*3/uL (ref 0.7–4.0)
Monocytes Absolute: 0.6 10*3/uL (ref 0.1–1.0)
Monocytes Relative: 6 % (ref 3–12)
Neutro Abs: 7.6 10*3/uL (ref 1.7–7.7)
Neutrophils Relative %: 76 % (ref 43–77)

## 2010-09-04 LAB — BASIC METABOLIC PANEL
BUN: 14 mg/dL (ref 6–23)
CO2: 29 mEq/L (ref 19–32)
Calcium: 9.2 mg/dL (ref 8.4–10.5)
Chloride: 101 mEq/L (ref 96–112)
Creatinine, Ser: 0.79 mg/dL (ref 0.4–1.2)
GFR calc Af Amer: >60 mL/min (ref >60)
GFR calc non Af Amer: >60 mL/min (ref >60)
Glucose, Bld: 96 mg/dL (ref 70–99)
Potassium: 3.6 mEq/L (ref 3.5–5.1)
Sodium: 134 mEq/L — ABNORMAL LOW (ref 135–145)

## 2010-09-04 LAB — URINE MICROSCOPIC-ADD ON

## 2010-09-04 NOTE — Telephone Encounter (Signed)
Her pharmacy called about the metformin. OV notes are not clear with directions. To pcp to clarify & will call pharmacy back

## 2010-09-04 NOTE — Telephone Encounter (Signed)
Pt was here yesterday was supposed to have Metformin called in but pharmacy doesn't have it. [facility] [address]

## 2010-09-04 NOTE — Telephone Encounter (Signed)
Dr. Clotilde Dieter is on vacation today. Her previous instructions were: Take 1 tablet (500 mg total) by mouth 2 (two) times daily with a meal. Take 1 pill every night for 2 weeks, then increased to 1 pill in the morning and 1 pill in the evening daily. Come to see me in 3 weeks so we can evaluate your blood sugar.   Good way to restart.

## 2010-09-04 NOTE — Telephone Encounter (Signed)
Ms. Kirschbaum called back stating she was told what she needed to do regarding her Metformin rx.  Need to talk with nurse so she can have the medicine filled.

## 2010-09-05 NOTE — Telephone Encounter (Signed)
Called to pharmacy 

## 2010-09-11 ENCOUNTER — Encounter: Payer: Self-pay | Admitting: *Deleted

## 2010-09-11 ENCOUNTER — Telehealth: Payer: Self-pay | Admitting: *Deleted

## 2010-09-11 NOTE — Telephone Encounter (Signed)
Attempted to call patient and inform of results. 2 numbers are disconnected and the work number she no longer works at. I will send letter to patient informing of below message

## 2010-09-11 NOTE — Telephone Encounter (Signed)
Message copied by Jimmy Footman on Wed Sep 11, 2010  9:04 AM ------      Message from: Jamie Brookes      Created: Tue Sep 10, 2010  5:48 PM      Regarding: please call this patient.        Please let this pateint know that her labs are good except her bad (LDL) cholesterol is a little high. We like to see it 99 or less and hers is 119. She can decrease her bad cholesterol by decreasing her intake of animal products (meat, cheese, dairy, eggs) and eating more vegetables instead. Please let her know.      Thanks,      Hospital doctor            ----- Message -----         From: Lab In [place] Interface         Sent: 09/03/2010  10:33 PM           To: Visual merchandiser

## 2010-09-23 ENCOUNTER — Other Ambulatory Visit: Payer: Self-pay | Admitting: Family Medicine

## 2010-09-23 DIAGNOSIS — Z1231 Encounter for screening mammogram for malignant neoplasm of breast: Secondary | ICD-10-CM

## 2010-10-01 ENCOUNTER — Ambulatory Visit
Admission: RE | Admit: 2010-10-01 | Discharge: 2010-10-01 | Disposition: A | Discharge status: Home / Self Care | Payer: Medicare Other | Source: Ambulatory Visit | Attending: *Deleted | Admitting: *Deleted

## 2010-10-01 DIAGNOSIS — Z1231 Encounter for screening mammogram for malignant neoplasm of breast: Secondary | ICD-10-CM

## 2010-10-10 ENCOUNTER — Other Ambulatory Visit: Payer: Self-pay | Admitting: Family Medicine

## 2010-10-10 NOTE — Telephone Encounter (Signed)
Refill request

## 2010-10-11 ENCOUNTER — Other Ambulatory Visit: Payer: Self-pay | Admitting: Family Medicine

## 2010-10-11 ENCOUNTER — Other Ambulatory Visit: Payer: Self-pay | Admitting: *Deleted

## 2010-10-11 DIAGNOSIS — Z1231 Encounter for screening mammogram for malignant neoplasm of breast: Secondary | ICD-10-CM

## 2010-11-02 ENCOUNTER — Other Ambulatory Visit: Payer: Self-pay | Admitting: Family Medicine

## 2010-11-04 ENCOUNTER — Encounter: Payer: Self-pay | Admitting: Family Medicine

## 2010-11-04 ENCOUNTER — Ambulatory Visit (INDEPENDENT_AMBULATORY_CARE_PROVIDER_SITE_OTHER): Payer: Medicare Other | Admitting: Family Medicine

## 2010-11-04 VITALS — BP 134/70 | HR 72 | Temp 98.2°F | Ht 66.5 in | Wt 287.0 lb

## 2010-11-04 DIAGNOSIS — E669 Obesity, unspecified: Secondary | ICD-10-CM

## 2010-11-04 DIAGNOSIS — R7309 Other abnormal glucose: Secondary | ICD-10-CM

## 2010-11-04 LAB — POCT GLYCOSYLATED HEMOGLOBIN (HGB A1C): Hemoglobin A1C: 5.9

## 2010-11-04 NOTE — Patient Instructions (Addendum)
Start taking your morning fasting blood glucose level before you eat or drink anything in the morning.  Write them down in your log and take them with your to your nutrition appointment.  We are going to try to keep your A1c from continuing to rise by controlling your blood sugar with diet.  I have put in a referral for nutrition counseling here in our office. We will call you with an appointment.

## 2010-11-04 NOTE — Progress Notes (Signed)
Hyperglycemia: Pt has had steadily increasing A1c levels and can not tolerate the metformin. She has tried it twice now and both times it makes her so sick feeling that she almost went to the ED. Her A1c was 6.3 at her last visit and we will check it again today but we also discussed getting her to see the nutritionist here and also using a glucometer to help her understand which foods make her sugars go up. She says she has been trying to walk more and eat healthfully.   OA: Pt says that she has pain in both of her hands when she is doing something repetative with her hands like peeling potatoes. It is worse when they are cold too. She has been using Tylenol arthritis and says that it helps. Also her pain resolves when she stops her activity.   ROS: neg except as noted above.   PE: Gen: NAD, seated comfortably in the chair.  Hands: Pt has no palpable enlargement of her finger joints.

## 2010-11-04 NOTE — Assessment & Plan Note (Addendum)
Pt had some elevated glucose levels and her A1c was creeping up. She was given an Rx for Metformin but did not tolerate it well. Plan to get her a meter so that she can learn the foods that make her blood sugar go high and hopefully stall the progression and increasing A1c.  Will retest A1c today.  Pt would like to talk to nutritionist about healthy foods also. Referral done.

## 2010-11-04 NOTE — Telephone Encounter (Signed)
Refill request

## 2010-11-05 ENCOUNTER — Encounter: Payer: Self-pay | Admitting: Family Medicine

## 2010-11-05 NOTE — Assessment & Plan Note (Signed)
Pt is 287 lbs today. She has lost 1 lb since I saw her last.

## 2010-12-26 ENCOUNTER — Other Ambulatory Visit: Payer: Self-pay | Admitting: Family Medicine

## 2010-12-26 NOTE — Telephone Encounter (Signed)
Refill request

## 2011-01-14 ENCOUNTER — Encounter: Payer: Medicare Other | Attending: Family Medicine | Admitting: Dietician

## 2011-01-14 DIAGNOSIS — R7309 Other abnormal glucose: Secondary | ICD-10-CM | POA: Insufficient documentation

## 2011-01-14 DIAGNOSIS — Z713 Dietary counseling and surveillance: Secondary | ICD-10-CM | POA: Insufficient documentation

## 2011-01-14 NOTE — Progress Notes (Signed)
  Medical Nutrition Therapy:  Appt start time:1120 end time:  12:20   Assessment:  Primary concerns today: Abnormal fasting blood glucose levels.  Reports that her MD had told her that her blood glucose was elevated but that she did not have diabetes. The nurse provided her with a blood glucose meter and taught her how to use it.  She noted that she did not tell her what the normal readings were.  She has the meter and has been checking in the morning before food or drink.  She has a sister with diabetes but that is the only individual in her family history that has been diagnosed with diabetes.  Her history is positive for having used prednisone at times for asthma and one of her HTN medications contains HCTZ.  She reports her fasting glucose levels as bing in the 98,95, 105, 110, 120 ranges.  She did not bring her meter today.  Wishes to know the normal numbers.   MEDICATIONS: Review of medications.  Reports that she was unable to take Metformin due to the GI effects.   DIETARY INTAKE:  Usual eating pattern includes 2-3 meals and 1-2 snacks per day.  24-hr recall:  B (9-10 AM): May eat leftovers from the day before.  May have 1 pack of the original oatmeal with some canned milk and sugar added.  Will drink sweet tea or water.  Another meal would include 1 egg, 1 slice of white bread, tomato, a slice of ham, and +/- orange juice.   L (2:00 PM): lunch is often leftovers such as green beans 3/4 cup, part of a [place] wing (fresh) and water Snk (Mid-afternoon PM): large dill pickle which she peels. D (5:30-7:00 PM): White potatoes 1 cup, Sauerkraut , beef smoked sausage. And water Snk (9:00 PM): 1/4 cup of the potatoes, 1/4 cup of the sauerkraut, and a little of a sausage.  Followed by a large dill pickle at 1:30-12:00 pm  Usual physical activity: Does her house work. With the increased lung problems, she is unable to walk great distances.  Limited in the amount of time she can be out of the house in  humid weather.  Estimated energy needs: 1400  calories 155-160 g carbohydrates 105-110 g protein 35-38 g fat  Progress Towards Goal(s):  In progress.   Nutritional Diagnosis:  -2.1 Inpaired nutrition utilization As related to glucose.  As evidenced by weight gain and abnormal fasting blood glucose.    Intervention:  Nutrition Review of diet reveals a need to increase the more complex carbohydrates and her intake of non-starchy vegetables and a need to limit her sodium intake given the swelling following the bout with blood clot in her leg.  Monitoring/Evaluation:  Dietary intake, exercise, blood glucose levels, and body weight in 8-12 weeks.

## 2011-01-14 NOTE — Patient Instructions (Signed)
   Try doing the chair exercises using the band or the CD at least 5 times per week.   Consider calling the [facility] for the chair exercise classes with the congregational nurse.  Look to keep the glucose (fasting: before food or drink in the morning at (90-110)  Continue to eat regular meals and snacks.  Look for a sugar substitute such as Splenda or Equal or a store brand to use in your ice tea or in oatmeal.  Use your food label:  Fiber- 2 gm for a serving of bread and 3 gm or greater for the cereals or cereal bars or foods that naturally contain fiber.  Keep the sugar level in each serving to 0-9 gm.  Continue to try to limit salt/sodium, consider rinsing canned vegetables and soaking the preserved meats (sausages).  Use the list for potassium vegetables and fruits.  Try to get 1-2 servings per day.  Follow-up with questions or issues.

## 2011-02-25 ENCOUNTER — Other Ambulatory Visit: Payer: Self-pay | Admitting: Family Medicine

## 2011-02-25 NOTE — Telephone Encounter (Signed)
Refill request

## 2011-03-12 ENCOUNTER — Emergency Department (HOSPITAL_COMMUNITY): Payer: Medicare Other

## 2011-03-12 ENCOUNTER — Inpatient Hospital Stay (HOSPITAL_COMMUNITY)
Admission: EM | Admit: 2011-03-12 | Discharge: 2011-03-19 | DRG: 175 | Disposition: A | Discharge status: Home Health | Payer: Medicare Other | Source: Other Acute Inpatient Hospital | Attending: Family Medicine | Admitting: Family Medicine

## 2011-03-12 ENCOUNTER — Emergency Department (HOSPITAL_COMMUNITY)
Admission: EM | Admit: 2011-03-12 | Discharge: 2011-03-12 | Disposition: A | Discharge status: Home / Self Care | Payer: Medicare Other | Source: Home / Self Care | Attending: Emergency Medicine | Admitting: Emergency Medicine

## 2011-03-12 DIAGNOSIS — Z86718 Personal history of other venous thrombosis and embolism: Secondary | ICD-10-CM

## 2011-03-12 DIAGNOSIS — J45909 Unspecified asthma, uncomplicated: Secondary | ICD-10-CM | POA: Diagnosis present

## 2011-03-12 DIAGNOSIS — I2699 Other pulmonary embolism without acute cor pulmonale: Secondary | ICD-10-CM

## 2011-03-12 DIAGNOSIS — R339 Retention of urine, unspecified: Secondary | ICD-10-CM | POA: Diagnosis not present

## 2011-03-12 DIAGNOSIS — I517 Cardiomegaly: Secondary | ICD-10-CM

## 2011-03-12 DIAGNOSIS — Z7901 Long term (current) use of anticoagulants: Secondary | ICD-10-CM

## 2011-03-12 DIAGNOSIS — I509 Heart failure, unspecified: Secondary | ICD-10-CM | POA: Diagnosis present

## 2011-03-12 DIAGNOSIS — I519 Heart disease, unspecified: Secondary | ICD-10-CM | POA: Diagnosis present

## 2011-03-12 DIAGNOSIS — E8881 Metabolic syndrome: Secondary | ICD-10-CM | POA: Diagnosis present

## 2011-03-12 DIAGNOSIS — I2692 Saddle embolus of pulmonary artery without acute cor pulmonale: Principal | ICD-10-CM | POA: Diagnosis present

## 2011-03-12 DIAGNOSIS — I5030 Unspecified diastolic (congestive) heart failure: Secondary | ICD-10-CM | POA: Diagnosis present

## 2011-03-12 DIAGNOSIS — R000 Tachycardia, unspecified: Secondary | ICD-10-CM | POA: Diagnosis present

## 2011-03-12 DIAGNOSIS — M25579 Pain in unspecified ankle and joints of unspecified foot: Secondary | ICD-10-CM | POA: Diagnosis present

## 2011-03-12 DIAGNOSIS — E119 Type 2 diabetes mellitus without complications: Secondary | ICD-10-CM | POA: Diagnosis present

## 2011-03-12 DIAGNOSIS — I82819 Embolism and thrombosis of superficial veins of unspecified lower extremities: Secondary | ICD-10-CM | POA: Diagnosis present

## 2011-03-12 DIAGNOSIS — R0902 Hypoxemia: Secondary | ICD-10-CM

## 2011-03-12 DIAGNOSIS — R0602 Shortness of breath: Secondary | ICD-10-CM

## 2011-03-12 DIAGNOSIS — J9600 Acute respiratory failure, unspecified whether with hypoxia or hypercapnia: Secondary | ICD-10-CM | POA: Diagnosis present

## 2011-03-12 DIAGNOSIS — I10 Essential (primary) hypertension: Secondary | ICD-10-CM | POA: Diagnosis present

## 2011-03-12 DIAGNOSIS — I959 Hypotension, unspecified: Secondary | ICD-10-CM

## 2011-03-12 LAB — DIFFERENTIAL
Basophils Absolute: 0 10*3/uL (ref 0.0–0.1)
Basophils Relative: 0 % (ref 0–1)
Eosinophils Absolute: 0.5 10*3/uL (ref 0.0–0.7)
Eosinophils Relative: 3 % (ref 0–5)
Lymphocytes Relative: 13 % (ref 12–46)
Lymphs Abs: 1.9 10*3/uL (ref 0.7–4.0)
Monocytes Absolute: 0.8 10*3/uL (ref 0.1–1.0)
Monocytes Relative: 5 % (ref 3–12)
Neutro Abs: 12 10*3/uL — ABNORMAL HIGH (ref 1.7–7.7)
Neutrophils Relative %: 79 % — ABNORMAL HIGH (ref 43–77)

## 2011-03-12 LAB — PRO B NATRIURETIC PEPTIDE
Pro B Natriuretic peptide (BNP): 23.6 pg/mL (ref 0–125)
Pro B Natriuretic peptide (BNP): 1048 pg/mL — ABNORMAL HIGH (ref 0–125)

## 2011-03-12 LAB — BASIC METABOLIC PANEL
BUN: 21 mg/dL (ref 6–23)
BUN: 20 mg/dL (ref 6–23)
CO2: 22 mEq/L (ref 19–32)
CO2: 23 mEq/L (ref 19–32)
Calcium: 9.7 mg/dL (ref 8.4–10.5)
Calcium: 9.7 mg/dL (ref 8.4–10.5)
Chloride: 98 mEq/L (ref 96–112)
Chloride: 101 mEq/L (ref 96–112)
Creatinine, Ser: 1.04 mg/dL (ref 0.50–1.10)
Creatinine, Ser: 1.02 mg/dL (ref 0.50–1.10)
GFR calc Af Amer: 61 mL/min — ABNORMAL LOW (ref >90)
GFR calc Af Amer: 63 mL/min — ABNORMAL LOW (ref >90)
GFR calc non Af Amer: 53 mL/min — ABNORMAL LOW (ref >90)
GFR calc non Af Amer: 54 mL/min — ABNORMAL LOW (ref >90)
Glucose, Bld: 161 mg/dL — ABNORMAL HIGH (ref 70–99)
Glucose, Bld: 119 mg/dL — ABNORMAL HIGH (ref 70–99)
Potassium: 2.9 mEq/L — ABNORMAL LOW (ref 3.5–5.1)
Potassium: 3.7 mEq/L (ref 3.5–5.1)
Sodium: 135 mEq/L (ref 135–145)
Sodium: 137 mEq/L (ref 135–145)

## 2011-03-12 LAB — CBC
HCT: 37.7 % (ref 36.0–46.0)
Hemoglobin: 12.7 g/dL (ref 12.0–15.0)
MCH: 30.4 pg (ref 26.0–34.0)
MCHC: 33.7 g/dL (ref 30.0–36.0)
MCV: 90.2 fL (ref 78.0–100.0)
Platelets: 224 10*3/uL (ref 150–400)
RBC: 4.18 MIL/uL (ref 3.87–5.11)
RDW: 13.3 % (ref 11.5–15.5)
WBC: 15.2 10*3/uL — ABNORMAL HIGH (ref 4.0–10.5)

## 2011-03-12 LAB — URINALYSIS, ROUTINE W REFLEX MICROSCOPIC
Bilirubin Urine: NEGATIVE
Glucose, UA: NEGATIVE mg/dL
Ketones, ur: NEGATIVE mg/dL
Leukocytes, UA: NEGATIVE
Nitrite: NEGATIVE
Protein, ur: NEGATIVE mg/dL
Specific Gravity, Urine: >1.046 — ABNORMAL HIGH (ref 1.005–1.030)
Urobilinogen, UA: 0.2 mg/dL (ref 0.0–1.0)
pH: 5 (ref 5.0–8.0)

## 2011-03-12 LAB — GLUCOSE, CAPILLARY
Glucose-Capillary: 156 mg/dL — ABNORMAL HIGH (ref 70–99)
Glucose-Capillary: 125 mg/dL — ABNORMAL HIGH (ref 70–99)

## 2011-03-12 LAB — D-DIMER, QUANTITATIVE: D-Dimer, Quant: >20 ug/mL-FEU — ABNORMAL HIGH (ref 0.00–0.48)

## 2011-03-12 LAB — MAGNESIUM: Magnesium: 1.9 mg/dL (ref 1.5–2.5)

## 2011-03-12 LAB — URINE MICROSCOPIC-ADD ON

## 2011-03-12 LAB — TROPONIN I: Troponin I: <0.3 ng/mL (ref <0.30)

## 2011-03-12 LAB — CARDIAC PANEL(CRET KIN+CKTOT+MB+TROPI)
CK, MB: 7.2 ng/mL (ref 0.3–4.0)
Relative Index: 6.7 — ABNORMAL HIGH (ref 0.0–2.5)
Total CK: 108 U/L (ref 7–177)
Troponin I: 0.93 ng/mL (ref <0.30)

## 2011-03-12 LAB — PHOSPHORUS: Phosphorus: 3.9 mg/dL (ref 2.3–4.6)

## 2011-03-12 LAB — PROTIME-INR
INR: 1.18 (ref 0.00–1.49)
INR: 1.21 (ref 0.00–1.49)
Prothrombin Time: 15.2 seconds (ref 11.6–15.2)
Prothrombin Time: 15.6 seconds — ABNORMAL HIGH (ref 11.6–15.2)

## 2011-03-12 LAB — APTT
aPTT: 29 seconds (ref 24–37)
aPTT: 55 seconds — ABNORMAL HIGH (ref 24–37)

## 2011-03-12 LAB — LACTIC ACID, PLASMA: Lactic Acid, Venous: 1.4 mmol/L (ref 0.5–2.2)

## 2011-03-12 LAB — MRSA PCR SCREENING: MRSA by PCR: NEGATIVE

## 2011-03-12 MED ORDER — IOHEXOL 300 MG/ML  SOLN
100.0000 mL | Freq: Once | INTRAMUSCULAR | Status: AC | PRN
  Administered 2011-03-12: 100 mL via INTRAVENOUS

## 2011-03-13 ENCOUNTER — Inpatient Hospital Stay (HOSPITAL_COMMUNITY): Payer: Medicare Other

## 2011-03-13 ENCOUNTER — Ambulatory Visit: Payer: Medicare Other | Admitting: Family Medicine

## 2011-03-13 DIAGNOSIS — R0902 Hypoxemia: Secondary | ICD-10-CM

## 2011-03-13 DIAGNOSIS — I2699 Other pulmonary embolism without acute cor pulmonale: Secondary | ICD-10-CM

## 2011-03-13 LAB — URINE CULTURE
Colony Count: NO GROWTH
Culture  Setup Time: 201210101036
Culture: NO GROWTH

## 2011-03-13 LAB — CARDIAC PANEL(CRET KIN+CKTOT+MB+TROPI)
CK, MB: 5.2 ng/mL — ABNORMAL HIGH (ref 0.3–4.0)
CK, MB: 4.2 ng/mL — ABNORMAL HIGH (ref 0.3–4.0)
Relative Index: INVALID (ref 0.0–2.5)
Relative Index: 4.2 — ABNORMAL HIGH (ref 0.0–2.5)
Total CK: 98 U/L (ref 7–177)
Total CK: 100 U/L (ref 7–177)
Troponin I: 0.54 ng/mL (ref <0.30)
Troponin I: 0.55 ng/mL (ref <0.30)

## 2011-03-13 LAB — GLUCOSE, CAPILLARY
Glucose-Capillary: 108 mg/dL — ABNORMAL HIGH (ref 70–99)
Glucose-Capillary: 122 mg/dL — ABNORMAL HIGH (ref 70–99)
Glucose-Capillary: 122 mg/dL — ABNORMAL HIGH (ref 70–99)
Glucose-Capillary: 96 mg/dL (ref 70–99)

## 2011-03-13 LAB — BASIC METABOLIC PANEL
BUN: 18 mg/dL (ref 6–23)
CO2: 23 mEq/L (ref 19–32)
Calcium: 9.3 mg/dL (ref 8.4–10.5)
Chloride: 104 mEq/L (ref 96–112)
Creatinine, Ser: 0.91 mg/dL (ref 0.50–1.10)
GFR calc Af Amer: 72 mL/min — ABNORMAL LOW (ref >90)
GFR calc non Af Amer: 62 mL/min — ABNORMAL LOW (ref >90)
Glucose, Bld: 114 mg/dL — ABNORMAL HIGH (ref 70–99)
Potassium: 3.9 mEq/L (ref 3.5–5.1)
Sodium: 139 mEq/L (ref 135–145)

## 2011-03-13 LAB — PROTEIN C ACTIVITY: Protein C Activity: 74 % — ABNORMAL LOW (ref 75–133)

## 2011-03-13 LAB — LUPUS ANTICOAGULANT PANEL
DRVVT: 44.3 secs — ABNORMAL HIGH (ref 34.1–42.2)
Lupus Anticoagulant: DETECTED — AB
PTT Lupus Anticoagulant: 83.6 secs — ABNORMAL HIGH (ref 30.0–45.6)
PTTLA 4:1 Mix: 76.3 secs — ABNORMAL HIGH (ref 30.0–45.6)
PTTLA Confirmation: 8.1 secs — ABNORMAL HIGH (ref <8.0)
dRVVT Incubated 1:1 Mix: 37.4 secs (ref 34.1–42.2)

## 2011-03-13 LAB — CARDIOLIPIN ANTIBODIES, IGG, IGM, IGA
Anticardiolipin IgA: 9 APL U/mL — ABNORMAL LOW (ref <22)
Anticardiolipin IgG: 4 GPL U/mL — ABNORMAL LOW (ref <23)
Anticardiolipin IgM: 2 MPL U/mL — ABNORMAL LOW (ref <11)

## 2011-03-13 LAB — CBC
HCT: 32.5 % — ABNORMAL LOW (ref 36.0–46.0)
Hemoglobin: 10.6 g/dL — ABNORMAL LOW (ref 12.0–15.0)
MCH: 29 pg (ref 26.0–34.0)
MCHC: 32.6 g/dL (ref 30.0–36.0)
MCV: 89 fL (ref 78.0–100.0)
Platelets: 212 10*3/uL (ref 150–400)
RBC: 3.65 MIL/uL — ABNORMAL LOW (ref 3.87–5.11)
RDW: 13.5 % (ref 11.5–15.5)
WBC: 12 10*3/uL — ABNORMAL HIGH (ref 4.0–10.5)

## 2011-03-13 LAB — MAGNESIUM: Magnesium: 2.1 mg/dL (ref 1.5–2.5)

## 2011-03-13 LAB — HEPARIN LEVEL (UNFRACTIONATED)
Heparin Unfractionated: 0.3 IU/mL (ref 0.30–0.70)
Heparin Unfractionated: 0.47 IU/mL (ref 0.30–0.70)

## 2011-03-13 LAB — BETA-2-GLYCOPROTEIN I ABS, IGG/M/A
Beta-2 Glyco I IgG: 0 G Units (ref <20)
Beta-2-Glycoprotein I IgA: 3 A Units (ref <20)
Beta-2-Glycoprotein I IgM: 1 M Units (ref <20)

## 2011-03-13 LAB — PROTEIN S ACTIVITY: Protein S Activity: 72 % (ref 69–129)

## 2011-03-13 LAB — ANTITHROMBIN III: AntiThromb III Func: 91 % (ref 76–126)

## 2011-03-13 LAB — HOMOCYSTEINE: Homocysteine: 15.9 umol/L — ABNORMAL HIGH (ref 4.0–15.4)

## 2011-03-13 LAB — PHOSPHORUS: Phosphorus: 3 mg/dL (ref 2.3–4.6)

## 2011-03-14 LAB — CBC
HCT: 31.4 % — ABNORMAL LOW (ref 36.0–46.0)
Hemoglobin: 10.5 g/dL — ABNORMAL LOW (ref 12.0–15.0)
MCH: 29.7 pg (ref 26.0–34.0)
MCHC: 33.4 g/dL (ref 30.0–36.0)
MCV: 89 fL (ref 78.0–100.0)
Platelets: 202 10*3/uL (ref 150–400)
RBC: 3.53 MIL/uL — ABNORMAL LOW (ref 3.87–5.11)
RDW: 13.2 % (ref 11.5–15.5)
WBC: 11.6 10*3/uL — ABNORMAL HIGH (ref 4.0–10.5)

## 2011-03-14 LAB — GLUCOSE, CAPILLARY
Glucose-Capillary: 102 mg/dL — ABNORMAL HIGH (ref 70–99)
Glucose-Capillary: 111 mg/dL — ABNORMAL HIGH (ref 70–99)
Glucose-Capillary: 104 mg/dL — ABNORMAL HIGH (ref 70–99)
Glucose-Capillary: 83 mg/dL (ref 70–99)

## 2011-03-14 LAB — FACTOR 5 LEIDEN

## 2011-03-14 LAB — PROTEIN C, TOTAL: Protein C, Total: 103 % (ref 72–160)

## 2011-03-14 LAB — PROTIME-INR
INR: 1.26 (ref 0.00–1.49)
Prothrombin Time: 16.1 seconds — ABNORMAL HIGH (ref 11.6–15.2)

## 2011-03-14 LAB — HEPARIN LEVEL (UNFRACTIONATED): Heparin Unfractionated: 0.33 IU/mL (ref 0.30–0.70)

## 2011-03-14 LAB — PROTEIN S, TOTAL: Protein S Ag, Total: 98 % (ref 60–150)

## 2011-03-15 DIAGNOSIS — I2699 Other pulmonary embolism without acute cor pulmonale: Secondary | ICD-10-CM

## 2011-03-15 LAB — CBC
HCT: 31.5 % — ABNORMAL LOW (ref 36.0–46.0)
Hemoglobin: 10.6 g/dL — ABNORMAL LOW (ref 12.0–15.0)
MCH: 30.4 pg (ref 26.0–34.0)
MCHC: 33.7 g/dL (ref 30.0–36.0)
MCV: 90.3 fL (ref 78.0–100.0)
Platelets: 226 10*3/uL (ref 150–400)
RBC: 3.49 MIL/uL — ABNORMAL LOW (ref 3.87–5.11)
RDW: 13.4 % (ref 11.5–15.5)
WBC: 11.7 10*3/uL — ABNORMAL HIGH (ref 4.0–10.5)

## 2011-03-15 LAB — PROTIME-INR
INR: 1.31 (ref 0.00–1.49)
Prothrombin Time: 16.5 seconds — ABNORMAL HIGH (ref 11.6–15.2)

## 2011-03-15 LAB — GLUCOSE, CAPILLARY
Glucose-Capillary: 101 mg/dL — ABNORMAL HIGH (ref 70–99)
Glucose-Capillary: 108 mg/dL — ABNORMAL HIGH (ref 70–99)

## 2011-03-16 LAB — CBC
HCT: 31.9 % — ABNORMAL LOW (ref 36.0–46.0)
Hemoglobin: 10.7 g/dL — ABNORMAL LOW (ref 12.0–15.0)
MCH: 30.1 pg (ref 26.0–34.0)
MCHC: 33.5 g/dL (ref 30.0–36.0)
MCV: 89.9 fL (ref 78.0–100.0)
Platelets: 244 10*3/uL (ref 150–400)
RBC: 3.55 MIL/uL — ABNORMAL LOW (ref 3.87–5.11)
RDW: 13.3 % (ref 11.5–15.5)
WBC: 11.5 10*3/uL — ABNORMAL HIGH (ref 4.0–10.5)

## 2011-03-16 LAB — PROTIME-INR
INR: 1.3 (ref 0.00–1.49)
Prothrombin Time: 16.4 seconds — ABNORMAL HIGH (ref 11.6–15.2)

## 2011-03-16 LAB — HEPARIN LEVEL (UNFRACTIONATED)
Heparin Unfractionated: 0.21 IU/mL — ABNORMAL LOW (ref 0.30–0.70)
Heparin Unfractionated: 0.42 IU/mL (ref 0.30–0.70)

## 2011-03-17 DIAGNOSIS — I2699 Other pulmonary embolism without acute cor pulmonale: Secondary | ICD-10-CM

## 2011-03-17 LAB — CBC
HCT: 35.5 % — ABNORMAL LOW (ref 36.0–46.0)
Hemoglobin: 12.1 g/dL (ref 12.0–15.0)
MCH: 30.6 pg (ref 26.0–34.0)
MCHC: 34.1 g/dL (ref 30.0–36.0)
MCV: 89.9 fL (ref 78.0–100.0)
Platelets: 272 10*3/uL (ref 150–400)
RBC: 3.95 MIL/uL (ref 3.87–5.11)
RDW: 13.5 % (ref 11.5–15.5)
WBC: 11.8 10*3/uL — ABNORMAL HIGH (ref 4.0–10.5)

## 2011-03-17 LAB — PROTIME-INR
INR: 1.58 — ABNORMAL HIGH (ref 0.00–1.49)
Prothrombin Time: 19.2 seconds — ABNORMAL HIGH (ref 11.6–15.2)

## 2011-03-17 LAB — PROTHROMBIN GENE MUTATION

## 2011-03-17 LAB — HEPARIN LEVEL (UNFRACTIONATED): Heparin Unfractionated: 0.34 IU/mL (ref 0.30–0.70)

## 2011-03-17 NOTE — Progress Notes (Signed)
Family Medicine Teaching Service Intern Progress Note  Subjective: No chest pain. No shortness of breath on room air. Mild R ankle pain better with ambulation.   Objective:  Tm98.9  HR: 71-84 RR: 15-22 BP: 1103-132/63-75  SaO2: 94 on RA at one point o/n on 2L Gen:  NAD HEENT: Mucous membranes are moist. CV: RRR, no mrg PULM: Bilateral bibasilar crackles. No wheeze ABD: soft/nontender/nondistended/normal bowel sounds EXT: 1+ edema unchanged from previous. Changes of venous stasis noted. Tender on posterior ankle with movement. Patient able to ambulate with slight pain relieved by walking.  Neuro: Patient is alert and oriented times three.  Labs and imaging:  Results for orders placed during the hospital encounter of 03/12/11 (from the past 24 hour(s))  HEPARIN LEVEL     Status: Normal   Collection Time   03/16/11 10:39 PM      Component Value Range   Heparin Unfractionated 0.42  0.30 - 0.70 (IU/mL)  CBC     Status: Abnormal   Collection Time   03/17/11  8:38 AM      Component Value Range   WBC 11.8 (*) 4.0 - 10.5 (K/uL)   RBC 3.95  3.87 - 5.11 (MIL/uL)   Hemoglobin 12.1  12.0 - 15.0 (g/dL)   HCT 14.7 (*) 82.9 - 46.0 (%)   MCV 89.9  78.0 - 100.0 (fL)   MCH 30.6  26.0 - 34.0 (pg)   MCHC 34.1  30.0 - 36.0 (g/dL)   RDW 56.2  13.0 - 86.5 (%)   Platelets 272  150 - 400 (K/uL)  HEPARIN LEVEL     Status: Normal   Collection Time   03/17/11  8:38 AM      Component Value Range   Heparin Unfractionated 0.34  0.30 - 0.70 (IU/mL)  PROTIME-INR     Status: Abnormal   Collection Time   03/17/11  8:38 AM      Component Value Range   Prothrombin Time 19.2 (*) 11.6 - 15.2 (seconds)   INR 1.58 (*) 0.00 - 1.49     Assessment  53F with massive Bilateral saddle pulmonary embolism and right DVT. Hemodynamically stable   Plan:  1. Saddle PE/R DVT -hemodynically stable on RA.  -On heparin/coumadin protocol. 1st dose Coumadin 10/11 pm. Day 4 overlap today-still not therapeutic INR. Coumadin  most recent 15 mg. -24 or 48 hours with therapeutic INR required before d/c. ASA until coumadin therapeutic.  -No IVC per ccm and vascular 2nd opinion  Hx-presented with SOB and patient concerned of asthma exacerbation. No wheezing. Found to have massive saddle PE on CTA. Echo, cardiac enzymes, and BNP c/w increased R heart strain. Initialy on 50% non rebreather weaned to 2 L over 2-3 days now stable on RA. Patient consistently hemodynamically stable.   2. HTN-holding Lisinopril, HCTZ, and lasix. Will restart when sbp >135.  3. Grade I diastolic CHF-on echo. Will be gentle with hydration. Standing daily weights. I/Os. Asymptomatic. Gentle with hydration. Consider restarting lasix.  4. 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 A1c most recent 5.9 in clinic 5. Asthma-advair BID. Alb prn. No wheeze/sob 6. Right ankle pain-patient with some pain in right ankle since last night. Improves with ambulation initially then worsens. Patient to obtain her orthotic shoes from home which she states helps.   FEN/GI: CHO modified. KVO IVF.  Prophylaxis:  tx dose heparin. No PPI Disposition: pending therapeutic overlap minimum of 24 hours. Ambulate BID< OOBTC BID. PT eval.   Tana Conch, MD PGY1, Family  Medicine Teaching Service [phone]

## 2011-03-18 DIAGNOSIS — I2699 Other pulmonary embolism without acute cor pulmonale: Secondary | ICD-10-CM

## 2011-03-18 LAB — CBC
HCT: 32 % — ABNORMAL LOW (ref 36.0–46.0)
Hemoglobin: 10.7 g/dL — ABNORMAL LOW (ref 12.0–15.0)
MCH: 30.1 pg (ref 26.0–34.0)
MCHC: 33.4 g/dL (ref 30.0–36.0)
MCV: 90.1 fL (ref 78.0–100.0)
Platelets: 277 10*3/uL (ref 150–400)
RBC: 3.55 MIL/uL — ABNORMAL LOW (ref 3.87–5.11)
RDW: 13.6 % (ref 11.5–15.5)
WBC: 10.2 10*3/uL (ref 4.0–10.5)

## 2011-03-18 LAB — PROTIME-INR
INR: 1.92 — ABNORMAL HIGH (ref 0.00–1.49)
Prothrombin Time: 22.3 seconds — ABNORMAL HIGH (ref 11.6–15.2)

## 2011-03-18 LAB — HEPARIN LEVEL (UNFRACTIONATED): Heparin Unfractionated: 0.35 IU/mL (ref 0.30–0.70)

## 2011-03-18 NOTE — Progress Notes (Signed)
Family Medicine Teaching Service Intern Progress Note  Subjective: No chest pain. No shortness of breath on room air. Mild R ankle pain better with ambulation.   Objective:  Tm98.1  HR: 63-86 RR: 17-24 BP: 102-124/58-70  SaO2: 95 on RA  I/Os not recorded. Wt stable at approximately 123 kg from time of admission.  Gen:  NAD HEENT: Mucous membranes are moist. CV: RRR, no mrg PULM: clear to auscultation bilaterally. No wheeze ABD: soft/nontender/nondistended/normal bowel sounds EXT: 1+ edema unchanged from previous. Changes of venous stasis noted. Tender on posterior ankle with movement. Patient able to ambulate with slight pain which improves with walking.  Neuro: Patient is alert and oriented times three.  Labs and imaging:  CBC     Status: Abnormal  HEPARIN LEVEL     Status: Normal  CBC     Status: Abnormal   Collection Time   03/18/11  6:35 AM      Component Value Range   WBC 10.2  4.0 - 10.5 (K/uL)   RBC 3.55 (*) 3.87 - 5.11 (MIL/uL)   Hemoglobin 10.7 (*) 12.0 - 15.0 (g/dL)   HCT 16.1 (*) 09.6 - 46.0 (%)   MCV 90.1  78.0 - 100.0 (fL)   MCH 30.1  26.0 - 34.0 (pg)   MCHC 33.4  30.0 - 36.0 (g/dL)   RDW 04.5  40.9 - 81.1 (%)   Platelets 277  150 - 400 (K/uL)  HEPARIN LEVEL     Status: Normal   Collection Time   03/18/11  6:35 AM      Component Value Range   Heparin Unfractionated 0.35  0.30 - 0.70 (IU/mL)  PROTIME-INR     Status: Abnormal   Collection Time   03/18/11  6:35 AM      Component Value Range   Prothrombin Time 22.3 (*) 11.6 - 15.2 (seconds)   INR 1.92 (*) 0.00 - 1.49     Assessment  13F with massive Bilateral saddle pulmonary embolism and right DVT. Hemodynamically stable   Plan:  1. Saddle PE/R DVT -hemodynically stable on RA.  -On heparin/coumadin protocol. 1st dose Coumadin 10/11 pm. Day 5 overlap today-still not therapeutic INR-at 1.92 -spoke to Dr. Delford Field of [facility], given stability of patient, he is comfortable switching to Lovenox today with teaching to  be done today as well. If patient does well with teaching, potential d/c tomorrow with INR follow up 1-2 times this week -CCM wants 72 hours therapeutic INR before d/c heparin/lovenox.  -will d/c ASA when coumadin therapeutic.   -No IVC per ccm and vascular 2nd opinion  Hx-presented with SOB and patient concerned of asthma exacerbation. No wheezing. Found to have massive saddle PE on CTA. Echo, cardiac enzymes, and BNP c/w increased R heart strain. Initialy on 50% non rebreather weaned to 2 L over 2-3 days now stable on RA. Patient consistently hemodynamically stable.   2. HTN-holding Lisinopril, HCTZ, and lasix. Will restart when sbp >135. Blood pressures currently low but stable.  3. Grade I diastolic CHF-on echo. Will be gentle with hydration. Standing daily weights stable since admission.  I/Os. Asymptomatic. Gentle with hydration. Consider restarting lasix.  4. 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 A1c most recent 5.9 in clinic 5. Asthma-advair BID. Alb prn. No wheeze/sob 6. Right ankle pain-patient with some pain in right ankle since last night. Improves with ambulation initially then worsens. Patient got ortho shoes from home which are helping.   FEN/GI: CHO modified. KVO IVF.  Prophylaxis:  tx dose  heparin. No PPI Disposition: pending therapeutic overlap minimum of 24 hours. Ambulate BID and OOBTC BID. PT eval-possible cane, gave patient some exercises which she is doing well with.   Tana Conch, MD PGY1, Family Medicine Teaching Service [phone]     HPI   Review of Systems   Physical Exam

## 2011-03-19 ENCOUNTER — Encounter: Payer: Self-pay | Admitting: Family Medicine

## 2011-03-19 DIAGNOSIS — Z7901 Long term (current) use of anticoagulants: Secondary | ICD-10-CM

## 2011-03-19 DIAGNOSIS — I2699 Other pulmonary embolism without acute cor pulmonale: Secondary | ICD-10-CM

## 2011-03-19 LAB — PROTIME-INR
INR: 2.13 — ABNORMAL HIGH (ref 0.00–1.49)
Prothrombin Time: 24.2 seconds — ABNORMAL HIGH (ref 11.6–15.2)

## 2011-03-19 NOTE — Progress Notes (Signed)
  Family Medicine Teaching Service Intern Progress Note  Subjective: No chest pain. No shortness of breath on room air. Ankle pain better with orthotics  Objective:  Tm98.1 HR: 68-74 RR: 15-18 BP: 123-134/63-68 SaO2: >96 on RA.  I/Os not recorded. Wt stable at approximately 123 kg from time of admission. 125 recorded today but unsure whether standing or in bed measurement Gen: NAD  HEENT: Mucous membranes are moist.  CV: RRR, no mrg  PULM: clear to auscultation bilaterally. No wheeze  ABD: soft/nontender/nondistended/normal bowel sounds  EXT: 1+ edema unchanged from previous. Changes of venous stasis noted. Tender on posterior ankle with movement. Patient able to ambulate with slight pain which improves with walking.  Neuro: Patient is alert and oriented times three.   Labs and imaging:  Results for orders placed during the hospital encounter of 03/12/11 (from the past 24 hour(s))  PROTIME-INR     Status: Abnormal   Collection Time   03/19/11  6:10 AM      Component Value Range   Prothrombin Time 24.2 (*) 11.6 - 15.2 (seconds)   INR 2.13 (*) 0.00 - 1.49      Assessment  15F with massive Bilateral saddle pulmonary embolism and right DVT. Hemodynamically stable  Plan:  1. Saddle PE/R DVT  -hemodynically stable on RA.  -On coumadin protocol. 1st dose Coumadin 10/11 pm. Day 6 overlap today-INR now therapeutic  -continue Coumadin 10mg .  -switched to Lovenox 10/16 with teaching. Patient feels comfortable. Will dose through Friday AM.  -CCM wants 72 hours therapeutic INR before d/c heparin/lovenox.  -will need 2 more days of lovenox including today.  -will d/c ASA today. No nsaids for patient.  -No IVC per ccm and vascular 2nd opinion   Hx-presented with SOB and patient concerned of asthma exacerbation. No wheezing. Found to have massive saddle PE on CTA. Echo, cardiac enzymes, and BNP c/w increased R heart strain. Initialy on 50% non rebreather weaned to 2 L over 2-3 days now stable  on RA. Patient consistently hemodynamically stable.   2. HTN-holding Lisinopril, HCTZ, and lasix. Blood pressures steadily increasing. Will restart at discharge except lasix. Will need o/p decision as seems mainly for swelling. CHF does not seem to be primary cause.  3. Grade I diastolic CHF-on echo. Will be gentle with hydration. Standing daily weights stable since admission-increased weight today may not have been standing weight. I/Os. Asymptomatic. Gentle with hydration. Will restart lasix per pcp  4. 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 A1c most recent 5.9 in clinic  5. Asthma-advair BID. Alb prn. No wheeze/sob  6. Right ankle pain-patient with some pain in right ankle since last night. Improves with ambulation initially then worsens. Patient got ortho shoes from home which are helping.   FEN/GI: CHO modified. KVO IVF.  Prophylaxis: tx dose heparin. No PPI  Disposition: will d/c home today. Ambulate BID and OOBTC BID. PT eval-HHPT for antalgic gait and also rolling walker    Tana Conch, MD    PGY1, Family Medicine Teaching Service  [phone]

## 2011-03-19 NOTE — Discharge Summary (Signed)
Physician Discharge Summary  Patient ID: Erin Esparza MRN: [id number] DOB/AGE: February 14, 1941 70 y.o.  Admit date: 03/12/2011 Discharge date: 03/19/2011  PCP: Leana Roe, MD [facility]   Consultants:CCM Shan Levans, MD   Discharge Diagnosis: Primary 1. Massive Saddle Pulmonary Embolism 2. DVT right lower extremity 3. Right heart strain secondary to PE Secondary 1. hypertension 2. Grade I Diastolic CHF 3. Venous Stasis changes of lower extremities 4. Asthma 5. Insulin Resistance with A1c (5.7-6.5) 6. Right ankle pain 7. History of DVT previously on Coumadin in early 2000s  Discharge Medications New Lovenox 120 mg sq BID until 10/19 AM Coumadin 10 mg once daily STOP Lasix 40 mg BID ASA 81 Ibuprofen/advil/other nsaids. Continued  Details  ADVAIR DISKUS 100-50 MCG/DOSE AEPB INHALE 1 PUFF TWICE A DAY AS DIRECTED, Normal    albuterol (PROVENTIL,VENTOLIN) 90 MCG/ACT inhaler Inhale 2 puffs into the lungs every 4 (four) hours., Starting 08/06/2010, Until Discontinued, Normal    docusate sodium (COLACE) 100 MG capsule Take 100 mg by mouth daily.  , Until Discontinued, Historical Med    enalapril-hydrochlorothiazide (VASERETIC) 10-25 MG per tablet TAKE 1 TABLET BY MOUTH EVERY DAY FOR BLOOD PRESSURE, Normal     Hospital Course 55F with massive Bilateral saddle pulmonary embolism and right DVT.   1. Saddle pulmonary embolism/R deep vein thrombosis-presented with SOB and patient concerned of asthma exacerbation. No wheezing. Found to have massive saddle PE on CTA after elevated d-dimer. Echo, cardiac enzymes, and BNP c/w increased R heart strain. Patient presented to [facility] but was transferred to [facility] for CCM care.  Initialy on 50% non rebreather weaned to 2 L over 2-3 days then RA for several days. Patient consistently hemodynamically stable and transferred to floor bed. Lower extremity dopplers showed DVT of right lower extremity. Patient started on  heparin/coumadin protocol. Patient started on Coumadin on 10/11. Patient with 6 days of overlap with coumadin and heparin and became therapeutic on day 6. Transitioned to Lovenox and therapeutic coumadin for 1 day before discharge. Discharge dose of 10mg  Coumadin. Patient comfortable with Lovenox shots at home through Friday AM 10/19.  -No IVC filter per ccm and vascular 2nd opinion -Plan for lifelong coumadin given 2nd DVT and massive PE  2. HTN-patient with low blood pressures throughout hospitalization so held Lisinopril, HCTZ, and lasix. Blood pressures steadily increased so restarted at discharge. Did not restart lasix-Will need o/p decision as seems mainly for swelling. CHF does not seem to be primary cause.  3. Grade I diastolic CHF-weights stable throughout hospitalization. Will let PCP decide on restarting lasix per #2 4. 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 A1c most recent 5.9 in clinic. Will need f/u in clinic and continued lifestyle changes as patient obese.  5. Asthma-advair BID. Alb prn. No wheeze/sob except initially due to PE.  6. Right ankle pain-patient with some pain in right ankle which improved with ambulation/orthotics.      Procedures/Imaging:  1.CTA (10/10) 1. Massive bilateral pulmonary emboli with a saddle embolus at the bifurcation of the right left pulmonary arteries. Right heart  strain. Critical Value/emergent results were called by telephone at the time of interpretation on 03/12/2011 at 0856 hours to Dr.  Fredricka Bonine, who verbally acknowledged these results. 2. Suspect mild pulmonary edema or lymphedema. 3. Small perihepatic ascites. 2.CXR (10/11) Unchanged minimal enlargement of the central / perihilar pulmonary arteries likely sequela of known extensive pulmonary artery emboli as seen on recent chest CTA. 3. Echo 2-d (10/10)- EF 60-65% Wall  thickness was increased in a pattern of moderate LVH. Wall motion was normal; there     were no regional wall motion abnormalities.  Doppler parameters are consistent with abnormal left ventricular     relaxation (grade 1 diastolic dysfunction).: The interventricular septum is D-shaped in systole and diastole, suggesting RV pressure     and volume overload.    Labs  1. Hypercoagulability panel drawn-difficult to interpret in light of active hypercoag event. Lupus anticoagulant detected. Homocysteine mildly elevated. Factor V Leiden negative. Please see report 2. Initial troponin slightly elevated and was trending down (related to right heart strain)  Results for orders placed during the hospital encounter of 03/12/11 (from the past 48 hour(s))  CBC     Status: Abnormal   Collection Time   03/18/11  6:35 AM      Component Value Range Comment   WBC 10.2  4.0 - 10.5 (K/uL)    RBC 3.55 (*) 3.87 - 5.11 (MIL/uL)    Hemoglobin 10.7 (*) 12.0 - 15.0 (g/dL)    HCT 16.1 (*) 09.6 - 46.0 (%)    MCV 90.1  78.0 - 100.0 (fL)    MCH 30.1  26.0 - 34.0 (pg)    MCHC 33.4  30.0 - 36.0 (g/dL)    RDW 04.5  40.9 - 81.1 (%)    Platelets 277  150 - 400 (K/uL)   HEPARIN LEVEL     Status: Normal   Collection Time   03/18/11  6:35 AM      Component Value Range Comment   Heparin Unfractionated 0.35  0.30 - 0.70 (IU/mL)   PROTIME-INR     Status: Abnormal   Collection Time   03/18/11  6:35 AM      Component Value Range Comment   Prothrombin Time 22.3 (*) 11.6 - 15.2 (seconds)    INR 1.92 (*) 0.00 - 1.49    PROTIME-INR     Status: Abnormal   Collection Time   03/19/11  6:10 AM      Component Value Range Comment   Prothrombin Time 24.2 (*) 11.6 - 15.2 (seconds)    INR 2.13 (*) 0.00 - 1.49      Patient condition at time of discharge/disposition: stable   Disposition-home with rolling walker and HHPT for antalgic gait/deconditioning   Discharge follow up:  Discharge Orders    Future Appointments: Provider: Department: Dept Phone: Center:   03/20/2011 4:00 PM [facility] [phone] [facility]   03/26/2011 10:45 AM  Edd Arbour [facility] [phone] [facility]   04/08/2011 10:00 AM Shan Levans, MD [facility] [phone] None     Follow up issues: 1. Please follow up INR to ensure values of 2-3. Patient will need lifelong coumadin. First INR check on day after discharge 10/18 with PCP to decide on interval of follow up. LIFELONG coumadin given 2nd event and massive PE 2. Please follow up for any chest pain or shortness of breath.  3. Held patient's Lasix on discharge. Grade I diastolic but no overt signs of fluid overload while in hospital. Appears patient on primarily for lower extremity swelling.  4. Cause of PE unknown. Difficult to interpret hypercoagulability panel in light of active event. Lupus anticoagulant detected. Would need repeat in 12 weeks to confirm if desired. No history in patient of pregnancy loss.   SignedTana Conch 03/19/2011, 6:29 PM

## 2011-03-20 ENCOUNTER — Telehealth: Payer: Self-pay | Admitting: Family Medicine

## 2011-03-20 ENCOUNTER — Ambulatory Visit (INDEPENDENT_AMBULATORY_CARE_PROVIDER_SITE_OTHER): Payer: Medicare Other | Admitting: *Deleted

## 2011-03-20 DIAGNOSIS — Z7901 Long term (current) use of anticoagulants: Secondary | ICD-10-CM

## 2011-03-20 DIAGNOSIS — I2699 Other pulmonary embolism without acute cor pulmonale: Secondary | ICD-10-CM

## 2011-03-20 LAB — POCT INR: INR: 2.5

## 2011-03-20 MED ORDER — WARFARIN SODIUM 5 MG PO TABS: ORAL_TABLET | ORAL | Status: AC

## 2011-03-20 NOTE — Telephone Encounter (Signed)
Spoke to [place] in lab about [employer]. Patient with INR level approximately 2.5. Plan to reduce dose to 7.5 mg daily and recheck INR on 10/19. If INR decreases on 7.5 then will continue 10mg  over weekend. If INR increases, will alternate 10mg  and 7.5 mg over weekend unless dramatic rise over 3.0 (then will continue at 7.5 over weekend). Favor high side of INR given massive PE.

## 2011-03-21 ENCOUNTER — Ambulatory Visit: Payer: Medicare Other

## 2011-03-21 ENCOUNTER — Ambulatory Visit (INDEPENDENT_AMBULATORY_CARE_PROVIDER_SITE_OTHER): Payer: Medicare Other | Admitting: *Deleted

## 2011-03-21 DIAGNOSIS — I2699 Other pulmonary embolism without acute cor pulmonale: Secondary | ICD-10-CM

## 2011-03-21 DIAGNOSIS — Z7901 Long term (current) use of anticoagulants: Secondary | ICD-10-CM

## 2011-03-21 LAB — POCT INR: INR: 2.6

## 2011-03-24 ENCOUNTER — Ambulatory Visit (INDEPENDENT_AMBULATORY_CARE_PROVIDER_SITE_OTHER): Payer: Medicare Other | Admitting: *Deleted

## 2011-03-24 DIAGNOSIS — Z7901 Long term (current) use of anticoagulants: Secondary | ICD-10-CM

## 2011-03-24 DIAGNOSIS — I2699 Other pulmonary embolism without acute cor pulmonale: Secondary | ICD-10-CM

## 2011-03-24 LAB — POCT INR: INR: 2.2

## 2011-03-26 ENCOUNTER — Ambulatory Visit (INDEPENDENT_AMBULATORY_CARE_PROVIDER_SITE_OTHER): Payer: Medicare Other | Admitting: *Deleted

## 2011-03-26 ENCOUNTER — Ambulatory Visit (INDEPENDENT_AMBULATORY_CARE_PROVIDER_SITE_OTHER): Payer: Medicare Other | Admitting: Family Medicine

## 2011-03-26 ENCOUNTER — Encounter: Payer: Self-pay | Admitting: Family Medicine

## 2011-03-26 VITALS — BP 116/60 | HR 64 | Temp 98.3°F | Ht 66.5 in | Wt 272.2 lb

## 2011-03-26 DIAGNOSIS — Z7901 Long term (current) use of anticoagulants: Secondary | ICD-10-CM

## 2011-03-26 DIAGNOSIS — Z1211 Encounter for screening for malignant neoplasm of colon: Secondary | ICD-10-CM

## 2011-03-26 DIAGNOSIS — M722 Plantar fascial fibromatosis: Secondary | ICD-10-CM

## 2011-03-26 DIAGNOSIS — I2699 Other pulmonary embolism without acute cor pulmonale: Secondary | ICD-10-CM

## 2011-03-26 LAB — POCT INR: INR: 2.3

## 2011-03-26 MED ORDER — ZOSTER VACCINE LIVE 19400 UNT/0.65ML ~~LOC~~ SOLR: 0.6500 mL | Freq: Once | SUBCUTANEOUS | Status: DC

## 2011-03-26 MED ORDER — ASPIRIN 81 MG PO TABS: 81.0000 mg | ORAL_TABLET | Freq: Every day | ORAL | Status: AC

## 2011-03-26 MED ORDER — ZOSTER VACCINE LIVE 19400 UNT/0.65ML ~~LOC~~ SOLR: 0.6500 mL | Freq: Once | SUBCUTANEOUS | Status: AC

## 2011-03-26 MED ORDER — FLUTICASONE-SALMETEROL 100-50 MCG/DOSE IN AEPB: 1.0000 | INHALATION_SPRAY | Freq: Two times a day (BID) | RESPIRATORY_TRACT | Status: DC

## 2011-03-26 NOTE — Assessment & Plan Note (Signed)
Doing well. No signs or symptoms. INR check today Life-long coumadin therapy

## 2011-03-26 NOTE — Patient Instructions (Signed)
Pulmonary Embolus A pulmonary (lung) embolus (PE) is a blood clot that has traveled from another place in the body to the lung. Most clots come from deep veins in the legs or pelvis. PE is a dangerous and potentially life-threatening condition that can be treated if identified. CAUSES Blood clots form in a vein for different reasons. Usually several things cause blood clots. They include:  The flow of blood slows down.     The inside of the vein is damaged in some way.     The person has a condition that makes the blood clot more easily. These conditions may include:     Older age (especially over 74 years old).     Having a history of blood clots.     Having major or lengthy surgery. Hip surgery is particularly high-risk.     Breaking a hip or leg.     Sitting or lying still for a long time.     Cancer or cancer treatment.     Having a long, thin tube (catheter) placed inside a vein during a medical procedure.     Being overweight (obese).     Pregnancy and childbirth.     Medicines with estrogen.     Smoking.    Other circulation or heart problems.  SYMPTOMS   The symptoms of a PE usually start suddenly and include:  Shortness of breath.     Coughing.    Coughing up blood or blood-tinged mucus (phlegm).     Chest pain. Pain is often worse with deep breaths.     Rapid heartbeat.  DIAGNOSIS   If a PE is suspected, your caregiver will take a medical history and carry out a physical exam. Your caregiver will check for the risk factors listed above. Tests that also may be required include:  Blood tests, including studies of the clotting properties of your blood.     Imaging tests. Ultrasound, CT, MRI, and other tests can all be used to see if you have clots in your legs or lungs. If you have a clot in your legs and have breathing or chest problems, your caregiver may conclude that you have a clot in your lungs. Further lung tests may not be needed.     An EKG can  look for heart strain from blood clots in the lungs.  PREVENTION    Exercise the legs regularly. Take a brisk 30 minute walk every day.     Maintain a weight that is appropriate for your height.     Avoid sitting or lying in bed for long periods of time without moving your legs.     Women, particularly those over the age of 33, should consider the risks and benefits of taking estrogen medicines, including birth control pills.     Do not smoke, especially if you take estrogen medicines.     Long-distance travel can increase your risk. You should exercise your legs by walking or pumping the muscles every hour.     In hospital prevention:     Your caregiver will assess your need for preventive PE care (prophylaxis) when you are admitted to the hospital. If you are having surgery, your surgeon will assess you the day of or day after surgery.     Prevention may include medical and nonmedical measures.  TREATMENT    The most common treatment for a PE is blood thinning (anticoagulant) medicine, which reduces the blood's tendency to clot. Anticoagulants can stop new  blood clots from forming and old ones from growing. They cannot dissolve existing clots. Your body does this by itself over time. Anticoagulants can be given by mouth, by intravenous (IV) access, or by injection. Your caregiver will determine the best program for you.     Less commonly, clot-dissolving drugs (thrombolytics) are used to dissolve a PE. They carry a high risk of bleeding, so they are used mainly in severe cases.     Very rarely, a blood clot in the leg needs to be removed surgically.     If you are unable to take anticoagulants, your caregiver may arrange for you to have a filter placed in a main vein in your belly (abdomen). This filter prevents clots from traveling to your lungs.  HOME CARE INSTRUCTIONS    Take all medicines prescribed by your caregiver. Follow the directions carefully.     You will most likely  continue taking anticoagulants after you leave the hospital. Your caregiver will advise you on the length of treatment (usually 3 to 6 months, sometimes for life).     Taking too much or too little of an anticoagulant is dangerous. While taking this type of medicine, you will need to have regular blood tests to be sure the dose is correct. The dose can change for many reasons. It is critically important that you take this medicine exactly as prescribed and that you have blood tests exactly as directed.     Many foods can interfere with anticoagulants. These include foods high in vitamin K, such as spinach, kale, broccoli, cabbage, collard and turnip greens, Brussels sprouts, peas, cauliflower, seaweed, parsley, beef and pork liver, green tea, and soybean oil. Your caregiver should discuss limits on these foods with you or you should arrange a visit with a dietician to answer your questions.     Many medicines can interfere with anticoagulants. You must tell your caregiver about any and all medicines you take. This includesall vitamins and supplements. Be especially cautious with aspirin and anti-inflammatory medicines. Ask your caregiver before taking these.     Anticoagulants can have side effects, mostly excessive bruising or bleeding. You will need to hold pressure over cuts for longer than usual. Avoid alcoholic drinks or consume only very small amounts while taking this medicine.     If you are taking an anticoagulant:     Wear a medical alert bracelet.     Notify your dentist or other caregivers before procedures.     Avoid contact sports.     Ask your caregiver how soon you can go back to normal activities. Not being active can lead to new clots. Ask for a list of what you should and should not do.     Exercise your lower leg muscles. This is important while traveling.     You may need to wear compression stockings. These are tight elastic stockings that apply pressure to the lower legs.  This can help keep the blood in the legs from clotting.     If you are a smoker, you should quit.     Learn as much as you can about pulmonary embolisms.  SEEK MEDICAL CARE IF:    You notice a rapid heartbeat.     You feel weaker or more tired than usual.     You feel faint.     You notice increased bruising.     Your symptoms are not getting better in the time expected.     You are  having side effects of medicine.     You have an oral temperature above 102 F (38.9 C).     You discover other family members with blood clots. This may require further testing for inherited diseases or conditions.  SEEK IMMEDIATE MEDICAL CARE IF:    You have chest pain.     You have trouble breathing.     You have new or increased swelling or pain in one leg.     You cough up blood.     You notice blood in vomit, in a bowel movement, or in urine.     You have an oral temperature above 102 F (38.9 C), not controlled by medicine.  You may have another PE. A blood clot in the lungs is a medical emergency. Call your local emergency services (911 in [place] to get to the nearest hospital or clinic. Do not drive yourself. MAKE SURE YOU:    Understand these instructions.     Will watch your condition.     Will get help right away if you are not doing well or get worse.  Document Released: 05/16/2000 Document Revised: 01/29/2011 Document Reviewed: 11/20/2008 [facility] Patient Information 2012 [place], [place].Inferior Vena Cava Filter Insertion Insertion of an inferior vena cava (IVC) filter is usually a safe procedure. It is a procedure using a filter designed to help prevent blood clots in the legs or pelvis from traveling to the lungs. A large blood clot in the lungs can cause death. The risks involved are usually small and easily managed. This device is only used when blood thinners (anticoagulants) cannot be used. Some of these reasons may be:  Severe platelet problems or shortage.      Recent or current major bleeding which cannot be treated.     Bleeding associated with anticoagulants.     Recurrence of blood clot while on anticoagulants.     The need for surgery in the near future.     Bleeding in the head.  The filter is a small, metal device about an inch long. It is shaped like the spokes of an umbrella. The filter is placed in the inferior vena cava. The inferior vena cava is the large vein which returns blood from the lower body to the heart.   Blood clots are sometimes treated with blood thinning drugs called anti-coagulants. Filters are used when blood thinners may not be enough. Your caregivers will discuss these issues with you. Together you can determine the best course of treatment to take. EXPECTATIONS OF A FILTER  An IVC filter will reduce the risk, not eliminate it and cannot prevent small PE's.     It does not stop deep vein clots from growing, recurring, or developing the postphlebitic syndrome.  RISKS AND COMPLICATIONS  Vena cava filter insertion is a very safe procedure. Occasionally a small bruise forms around the needle insertion. A larger accumulation of blood called a hematoma may form. This is usually of no concern.     Continued bleeding or infections are uncommon.     Rarely damage is done to the vein by the catheter. This may require surgery or repair. There is a possibility that the filter can cause blockage of the vena cava. This can cause some swelling of the legs. There is a possibility that the filter will eventually fail and not work properly. Despite some problems, the procedure is usually safe and carried out without them  PROCEDURE    The procedure usually takes about one  half to one hour but this can vary.     A specialist in reading x-rays (radiologist) usually performs this procedure.     It is usually performed in a special room in the x-ray department. It is often done in a hospital or same-day surgical center. You will be  asked to put on a hospital gown.     Let your caregivers know of all allergies. This is very important if you have reacted to a dye given through the vein (intravenous) used for x-rays.     Do not eat or drink for four hours before the procedure or as instructed by your caregiver.     Sedatives are sometimes given to relieve anxiety.     The procedure is often done through the femoral vein (big vein in the groin). The skin around this area is usually shaved.     You will lie on the X-ray table, generally flat on your back. You need to have a needle put into a vein in your arm, so that the radiologist can give you medications (sedatives or painkillers).     An oxygen monitoring device may be attached to your finger. Oxygen may be given.     Everything is kept as sterile as possible during the procedure. The skin near the point of needle insertion will be cleaned with antiseptic and the area draped with sterile towels.     The skin and deeper tissues over the vein will be made numb with a local anesthetic. This is a medication like Novocaine. You are awake during the procedure and can let your caregivers know if you have discomfort.     A needle is then put into the vein. A guide wire is placed through the needle and into the vein. This is used to help insert a catheter into your vein. The radiologist uses the X-ray equipment to make sure that the catheter and the wire are in the correct position. The wire is then withdrawn. The filter can then be released from the catheter and left in place in the vena cava.     The catheter is then removed. Pressure will be kept on the needle insertion point for several minutes or until it is unlikely to bleed.  SEEK IMMEDIATE MEDICAL CARE IF:    You develop swelling and discoloration or pain in the legs.     Your legs become pale and cold or blue.     You develop shortness of breath, feel faint or pass out.     You develop chest pain, cough, difficulty  breathing or cough up blood.     You develop a rash or feel you are having problems which may be a side effect of medications given.     You develop weakness, difficulty moving your arms or legs or balance problems.     You develop problems with speech or vision.  Document Released: 07/09/2005 Document Revised: 01/29/2011 Document Reviewed: 06/28/2007 [facility] Patient Information 2012 [place], [place].

## 2011-03-26 NOTE — Progress Notes (Signed)
Addended by: Edd Arbour on: 03/26/2011 04:42 PM   Modules accepted: Orders

## 2011-03-26 NOTE — Assessment & Plan Note (Signed)
INR check today.  

## 2011-03-26 NOTE — Assessment & Plan Note (Signed)
Insoles. Improving slowly.

## 2011-03-26 NOTE — Progress Notes (Signed)
  Subjective:    Patient ID: Erin Esparza, female    DOB: 09/12/1940, 70 y.o.   MRN: [id number]  HPI 1. Hospital F/u Patient here after being DC'd from [facility] for PE and DVT. 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 She is feeling great today. Lungs are clear without pulmonary or cardiac complaints. Her vitals are all WNL. She has no hemoptysis, tachycardia, dyspnea, pleurisy.  2. Plantar fasciitis Right foot. Bought insoles. Improving slowly. No hx of trauma.  3. Colon Cancer screening Does not want colonoscopy despite counseling about risks/benefits/alternatives. Agrees to yearly stool guaiac.  No signs or symptoms/fam hx of colon ca.  4. zostavax counseling Sent prescription to pharmacy.   Review of Systems  Constitutional: Negative for fever, chills, diaphoresis, activity change, appetite change, fatigue and unexpected weight change.  HENT: Negative for neck pain.   Eyes: Negative for visual disturbance.  Respiratory: Negative for apnea, cough, chest tightness and shortness of breath.   Cardiovascular: Negative for chest pain, palpitations and leg swelling.  Gastrointestinal: Negative for abdominal pain, diarrhea, constipation, anal bleeding and rectal pain.  Genitourinary: Negative for dysuria, vaginal bleeding, vaginal discharge, difficulty urinating and dyspareunia.  Musculoskeletal: Negative for back pain and gait problem.  Skin: Negative for rash.  Neurological: Negative for syncope, speech difficulty, weakness and headaches.  Hematological: Does not bruise/bleed easily.       Objective:   Physical Exam  Nursing note and vitals reviewed. Constitutional: She appears well-developed and well-nourished. No distress.       obese  Cardiovascular: Normal rate, regular rhythm and normal heart sounds.   No murmur heard. Pulmonary/Chest: Effort normal and breath sounds normal. No respiratory distress. She has no wheezes.  Musculoskeletal: She  exhibits no edema and no tenderness.  Skin: She is not diaphoretic.      Assessment & Plan:  1. Hospital F/u/ PE Doing well INR check today Restarted ASA 81 mg  2. Plantar fasciitis Right foot. Bought insoles. Improving slowly.  3. Colon Cancer screening Stool guaiac yearly.  4. zostavax counseling Sent prescription to pharmacy.

## 2011-03-28 ENCOUNTER — Telehealth: Payer: Self-pay | Admitting: Family Medicine

## 2011-03-28 NOTE — Telephone Encounter (Signed)
Erin Esparza called back because she had given the wrong # for [facility].  The correct # is [phone].

## 2011-03-28 NOTE — Telephone Encounter (Signed)
[facility] not [facility]  She would like to go to [facility] on [address] instead, but [facility].  She would like Dr. Rivka Safer to send it and that # is [phone].

## 2011-03-28 NOTE — Discharge Summary (Signed)
Erin Esparza, Erin Esparza                  ACCOUNT NO.:  [id number]  MEDICAL RECORD NO.:  [id number]  LOCATION:  3737                         FACILITY:  [facility]  PHYSICIAN:  Santiago Bumpers. Hensel, M.D.DATE OF BIRTH:  Oct 01, 1940  DATE OF ADMISSION:  03/12/2011 DATE OF DISCHARGE:  03/19/2011                              DISCHARGE SUMMARY   PRIMARY CARE PHYSICIAN:  Edd Arbour of [facility].  CONSULTANTS:  Shan Levans of Critical Care Medicine.  DISCHARGE DIAGNOSES:  Primary: 1. Massive saddle pulmonary embolism. 2. Deep venous thrombosis, right lower extremity. 3. Right heart strain secondary to PE.  Secondary: 1. Hypertension. 2. Grade 1 diastolic congestive heart failure. 3. Venous stasis changes of lower extremities. 4. Asthma. 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 6. Right ankle pain. 7. History of DVT previously on Coumadin in early 2000.  DISCHARGE MEDICATIONS: 1. Lovenox 120 mg subcu b.i.d. until 03/21/11. 2. Coumadin 10 mg once daily.  Stop: 1. Lasix 40 mg b.i.d. 2. Aspirin 81. 3. Ibuprofen/Advil/other NSAIDs.  Continue: 1. Advair 100-50 inhale 1 puff twice daily as directed. 2. Albuterol 90 mcg inhaler, inhale 2 puffs every 4 hours as needed. 3. Colace 100 mg by mouth daily. 4. Enalapril/hydrochlorothiazide 10-25 take 1 tablet by mouth every     day for blood pressure.  HOSPITAL COURSE:  83F with history of DVT in 2002 p/w massive Bilateral saddle pulmonary embolism and right DVT.    1. Saddle pulmonary embolism/R deep vein thrombosis-presented with SOB and patient concerned of asthma exacerbation. No wheezing. Found to have massive saddle PE on CTA after elevated d-dimer. Echo, cardiac enzymes, and BNP c/w increased R heart strain.  Patient presented to [place] but was transferred to [facility] for CCM care.  Initialy on 50% non rebreather weaned to 2 L over 2-3 days then RA for several days. Patient consistently hemodynamically  stable and transferred to floor bed. Lower  extremity dopplers showed DVT of right lower extremity. Patient started on heparin/coumadin protocol. Patient started on Coumadin on 10/11. Patient with 6 days of overlap with coumadin and heparin and became therapeutic on day 6. Transitioned to Lovenox  and therapeutic coumadin for 1 day before discharge. Discharge dose of 10mg  Coumadin. Patient comfortable with Lovenox shots at home through Friday AM 10/19.   -No IVC filter per ccm and vascular 2nd opinion -Plan for lifelong coumadin given 2nd DVT and massive PE   2. HTN-patient with low blood pressures throughout hospitalization so held Lisinopril, HCTZ, and lasix. Blood pressures steadily increased so restarted at discharge. Did not restart lasix-Will need o/p decision as seems mainly for swelling. CHF does not  seem to be primary cause.   3. Grade I diastolic CHF-weights stable throughout hospitalization. Will let PCP decide on restarting lasix per #2 4. 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 A1c most recent 5.9 in clinic. Will need f/u in clinic and continued lifestyle changes as patient obese.   5. Asthma-advair BID. Alb prn. No wheeze/sob except initially due to PE.   6. Right ankle pain-patient with some pain in right ankle which improved with ambulation/orthotics.   PROCEDURES/IMAGING:   1.CTA (10/10)  1. Massive bilateral pulmonary emboli with a saddle embolus at the bifurcation of the right left pulmonary arteries. Right heart  strain. Critical Value/emergent results were called by telephone at the time of interpretation on 03/12/2011  at 0856 hours to Dr.   Fredricka Bonine, who verbally acknowledged these results. 2. Suspect mild pulmonary edema or lymphedema. 3. Small perihepatic ascites. 2.CXR (10/11) Unchanged minimal enlargement of the central / perihilar pulmonary arteries likely sequela of known extensive pulmonary artery emboli as seen on recent chest CTA. 3. Echo 2-d (10/10)- EF 60-65% Wall  thickness was increased in a pattern of moderate LVH. Wall motion was normal; there     were no regional wall motion abnormalities. Doppler parameters are consistent with abnormal left ventricular     relaxation (grade 1 diastolic dysfunction).: The interventricular septum is D-shaped in systole and diastole, suggesting RV pressure     and volume overload.    LABORATORY DATA:  Hypercoagulability panel drawn difficult to interpret in light of active hypercoagulability event.  Lupus anticoagulant detected. Homocystine is mildly elevated.  Factor V Leiden negative. Please see report.  Initial troponin slightly elevated and was trending down, related to right heart strain.  CBC with a white count 10.2, hemoglobin 10.7, platelets 277.  Heparin, most recent 0.35.  INR, most recent 2.13.  CONDITION ON DISCHARGE:  Stable.  DISPOSITION:  Home with rolling walker and home health PT for antalgic gait/deconditioning.  1. Discharge follow up with [facility] on     March 20, 2011, at 4 p.m. 2. Follow up with PCP Edd Arbour of [facility] on     March 26, 2011, at 10:45 am. 3. Followup with critical care medicine, Dr. Shan Levans on     April 08, 2011, at 10:00 am.  FOLLOWUP ISSUES: 1. Please follow up INR to ensure values in 2-3.  The patient will     need lifelong Coumadin.  First INR checked one day after discharge,     October 18, with PCP to decide on interval follow up.  Lifelong     Coumadin given secondary to deep vein thrombosis with PE. 2. Please follow up for any chest pain or shortness of breath. 3. Held the patient's Lasix on discharge.  Grade 1 diastolic     congestive heart failure but no overt signs of fluid overload while     in the hospital.  It appears the patient primarily on Lasix for     lower extremity swelling. 4. Positive PE, unknown, difficult to interpret hypercoagulability     panel in light of active  event.  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     Would need to repeat in 12 weeks to confirm if desired.  No history     of pregnancy loss.  No family history of clotting and lower     likelihood of antiphospholipid syndrome.    ______________________________ Tana Conch, MD   ______________________________ Santiago Bumpers. Leveda Anna, M.D.    SH/MEDQ  D:  03/19/2011  T:  03/20/2011  Job:  [id number]  Electronically Signed by Tana Conch MD on 03/25/2011 04:27:52 AM Electronically Signed by Doralee Albino M.D. on 03/28/2011 08:24:41 AM

## 2011-03-31 MED ORDER — ZOSTER VACCINE LIVE 19400 UNT/0.65ML ~~LOC~~ SOLR: 0.6500 mL | Freq: Once | SUBCUTANEOUS | Status: DC

## 2011-03-31 NOTE — Telephone Encounter (Signed)
Sent in prescription to [facility] [address]

## 2011-03-31 NOTE — Telephone Encounter (Signed)
Spoke with patient and informed her of below 

## 2011-04-01 ENCOUNTER — Ambulatory Visit (INDEPENDENT_AMBULATORY_CARE_PROVIDER_SITE_OTHER): Payer: Medicare Other | Admitting: *Deleted

## 2011-04-01 DIAGNOSIS — I2699 Other pulmonary embolism without acute cor pulmonale: Secondary | ICD-10-CM

## 2011-04-01 DIAGNOSIS — Z7901 Long term (current) use of anticoagulants: Secondary | ICD-10-CM

## 2011-04-01 LAB — POCT INR: INR: 1.8

## 2011-04-08 ENCOUNTER — Ambulatory Visit (INDEPENDENT_AMBULATORY_CARE_PROVIDER_SITE_OTHER): Payer: Medicare Other | Admitting: Critical Care Medicine

## 2011-04-08 ENCOUNTER — Ambulatory Visit (INDEPENDENT_AMBULATORY_CARE_PROVIDER_SITE_OTHER): Payer: Medicare Other | Admitting: *Deleted

## 2011-04-08 ENCOUNTER — Telehealth: Payer: Self-pay | Admitting: *Deleted

## 2011-04-08 ENCOUNTER — Encounter: Payer: Self-pay | Admitting: Critical Care Medicine

## 2011-04-08 DIAGNOSIS — I2699 Other pulmonary embolism without acute cor pulmonale: Secondary | ICD-10-CM

## 2011-04-08 DIAGNOSIS — Z7901 Long term (current) use of anticoagulants: Secondary | ICD-10-CM

## 2011-04-08 LAB — POCT INR: INR: 2.3

## 2011-04-08 NOTE — Assessment & Plan Note (Signed)
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

## 2011-04-08 NOTE — Telephone Encounter (Signed)
Patient requests handicap placard be filled out . Form placed in MD box.

## 2011-04-08 NOTE — Progress Notes (Signed)
Subjective:    Patient ID: Erin Esparza, female    DOB: 1941-03-15, 70 y.o.   MRN: [id number]  HPI 70 y.o.F with large saddle PE   DVT and right heart strain.  Obese .  Second DVT, pt on lifelong coumadin WAs in hosp 10/10>>10/17   INR fluctuating.  Coumadin adjusted. FPTS managing coumadin. Was sent home with lovenox /coumadin bridge.   Now not as dyspneic.  No chest pain.  No wheeze.  Some ankle edema d/t off lasix.     Review of Systems Constitutional:   No  weight loss, night sweats,  Fevers, chills, fatigue, lassitude. HEENT:   No headaches,  Difficulty swallowing,  Tooth/dental problems,  Sore throat,                No sneezing, itching, ear ache, nasal congestion, post nasal drip,   CV:  No chest pain,  Orthopnea, PND, swelling in lower extremities, anasarca, dizziness, palpitations  GI  No heartburn, indigestion, abdominal pain, nausea, vomiting, diarrhea, change in bowel habits, loss of appetite  Resp: No shortness of breath with exertion or at rest.  No excess mucus, no productive cough,  No non-productive cough,  No coughing up of blood.  No change in color of mucus.  No wheezing.  No chest wall deformity  Skin: no rash or lesions.  GU: no dysuria, change in color of urine, no urgency or frequency.  No flank pain.  MS:  No joint pain or swelling.  No decreased range of motion.  No back pain.  Psych:  No change in mood or affect. No depression or anxiety.  No memory loss.     Objective:   Physical Exam Filed Vitals:   04/08/11 1008  BP: 130/60  Pulse: 76  Temp: 98 F (36.7 C)  TempSrc: Oral  Height: 5\' 8"  (1.727 m)  Weight: 277 lb (125.646 kg)  SpO2: 100%    Gen: Pleasant, well-nourished, in no distress,  normal affect  ENT: No lesions,  mouth clear,  oropharynx clear, no postnasal drip  Neck: No JVD, no TMG, no carotid bruits  Lungs: No use of accessory muscles, no dullness to percussion, clear  Cardiovascular: RRR, heart sounds normal, no murmur or  gallops, no peripheral edema  Abdomen: soft and NT, no HSM,  BS normal  Musculoskeletal: No deformities, no cyanosis or clubbing  Neuro: alert, non focal  Skin: Warm, no lesions or rashes        Assessment & Plan:   Pulmonary embolism Bilateral saddle pulmonary embolism with associated deep venous thrombosis from October 2012 admission 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 aspirin 81 MG tablet Take 1 tablet (81 mg total) by mouth daily.  90 tablet  11  . enalapril-hydrochlorothiazide (VASERETIC) 10-25 MG per tablet TAKE 1 TABLET BY MOUTH EVERY DAY FOR BLOOD PRESSURE  31 tablet  11  . Fluticasone-Salmeterol (ADVAIR DISKUS) 100-50 MCG/DOSE AEPB Inhale 1 puff into the lungs 2 (two) times daily.  60 each  11  . warfarin (COUMADIN) 5 MG tablet 5 mg tablets. Take 1 and 1/2 tablet daily by mouth.  45 tablet  0  . albuterol (PROVENTIL,VENTOLIN) 90 MCG/ACT inhaler Inhale 2 puffs into the lungs every 4 (four) hours.  17 g  4  . DISCONTD:  docusate sodium (COLACE) 100 MG capsule Take 100 mg by mouth daily.        Marland Kitchen DISCONTD: zoster vaccine live, PF, (ZOSTAVAX) 16109 UNT/0.65ML injection Inject 19,400 Units into the skin once.  1 vial  0

## 2011-04-08 NOTE — Patient Instructions (Signed)
No change in medications. Return as needed 

## 2011-04-09 ENCOUNTER — Encounter: Payer: Self-pay | Admitting: Family Medicine

## 2011-04-09 ENCOUNTER — Ambulatory Visit (INDEPENDENT_AMBULATORY_CARE_PROVIDER_SITE_OTHER): Payer: Medicare Other | Admitting: Family Medicine

## 2011-04-09 VITALS — BP 138/73 | HR 69 | Temp 98.2°F | Ht 68.0 in | Wt 274.0 lb

## 2011-04-09 DIAGNOSIS — I10 Essential (primary) hypertension: Secondary | ICD-10-CM

## 2011-04-09 DIAGNOSIS — M7989 Other specified soft tissue disorders: Secondary | ICD-10-CM

## 2011-04-09 MED ORDER — FUROSEMIDE 40 MG PO TABS: 40.0000 mg | ORAL_TABLET | Freq: Every day | ORAL | Status: DC | PRN

## 2011-04-09 MED ORDER — ENALAPRIL MALEATE 20 MG PO TABS: 20.0000 mg | ORAL_TABLET | Freq: Every day | ORAL | Status: DC

## 2011-04-09 MED ORDER — HYDROCHLOROTHIAZIDE 25 MG PO TABS: 25.0000 mg | ORAL_TABLET | Freq: Every day | ORAL | Status: DC

## 2011-04-09 NOTE — Assessment & Plan Note (Signed)
Continue with compression socks. Lasix 40 mg PO PRN leg swelling. Patient has used this in the past.

## 2011-04-09 NOTE — Patient Instructions (Signed)
It was great seeing you today.  I sent in your medications.  Take your lasix as needed for leg swelling.  Stop taking your BP meds if you have diarrhea/vomiting and call my office.  See you in three months.

## 2011-04-09 NOTE — Assessment & Plan Note (Signed)
Patient has well controlled BP. Slightly elevated today: 138/73 Increased enalapril to 20 from 10.

## 2011-04-09 NOTE — Progress Notes (Signed)
  Subjective:    Patient ID: Erin Esparza, female    DOB: 10/14/1940, 70 y.o.   MRN: [id number]  HPI 1. HYPERTENSION Disease Monitoring Home BP Monitoring not doing Chest pain- no     Dyspnea-  no  Medications Compliance: taking as prescribed. Lightheadedness-  no  Edema-  yes, occasional ankle   ROS - See HPI  PMH Cardiovascular risk factors: advanced age (older than 55 for men, 93 for women), obesity (BMI >= 30 kg/m2), sedentary lifestyle and PE Lab Review   Potassium  Date Value Range Status  03/13/2011 3.9  3.5-5.1 (mEq/L) Final     Sodium  Date Value Range Status  03/13/2011 139  135-145 (mEq/L) Final   2. Leg Swelling In the evenings after walking she gets lower ext. Edema bilaterally. Nontender. She is not on any medications that cause this. She is already using compression stockings. Was taking lasix prior to hospital admission for this. She has no SOB/lung symptoms.    Review of Systems See hpi No fever/weight loss    Objective:   Physical Exam Heart - Regular rate and rhythm.  No murmurs, gallops or rubs.    Lungs:  Normal respiratory effort, chest expands symmetrically. Lungs are clear to auscultation, no crackles or wheezes. Extremities:  No cyanosis, no edema (has compression hose), or deformity noted. Pulses palpable. Non tender.     Assessment & Plan:

## 2011-04-10 NOTE — Telephone Encounter (Signed)
Ms. Wallenstein notified Handicap Placard is ready to be picked up at front desk.  Ileana Ladd

## 2011-04-21 ENCOUNTER — Other Ambulatory Visit: Payer: Self-pay | Admitting: *Deleted

## 2011-04-21 ENCOUNTER — Telehealth: Payer: Self-pay | Admitting: *Deleted

## 2011-04-21 DIAGNOSIS — Z8672 Personal history of thrombophlebitis: Secondary | ICD-10-CM

## 2011-04-21 MED ORDER — WARFARIN SODIUM 5 MG PO TABS: ORAL_TABLET | ORAL | Status: DC

## 2011-04-21 NOTE — Telephone Encounter (Signed)
Spoke to patient and informed her of rx sent in

## 2011-04-22 ENCOUNTER — Ambulatory Visit (INDEPENDENT_AMBULATORY_CARE_PROVIDER_SITE_OTHER): Payer: Medicare Other | Admitting: *Deleted

## 2011-04-22 DIAGNOSIS — Z7901 Long term (current) use of anticoagulants: Secondary | ICD-10-CM

## 2011-04-22 DIAGNOSIS — I2699 Other pulmonary embolism without acute cor pulmonale: Secondary | ICD-10-CM

## 2011-04-22 LAB — POCT INR: INR: 2.1

## 2011-05-07 ENCOUNTER — Telehealth: Payer: Self-pay | Admitting: Family Medicine

## 2011-05-07 NOTE — Telephone Encounter (Signed)
Asking for work in appt, pt has cold symptoms, breathing issues and sore throat, pt has been in and out of the hospital due to blood clots. Told daughter we were full here but I would send to RN to see what she suggests.

## 2011-05-07 NOTE — Telephone Encounter (Signed)
Calling back, is requesting an appt some time this morning since pt will have transportation.

## 2011-05-07 NOTE — Telephone Encounter (Signed)
Will route to the white team to see if Dr. Rivka Safer would like to work her in today.

## 2011-05-07 NOTE — Telephone Encounter (Signed)
I can see her

## 2011-05-15 ENCOUNTER — Ambulatory Visit (INDEPENDENT_AMBULATORY_CARE_PROVIDER_SITE_OTHER): Payer: Medicare Other | Admitting: *Deleted

## 2011-05-15 DIAGNOSIS — Z7901 Long term (current) use of anticoagulants: Secondary | ICD-10-CM

## 2011-05-15 DIAGNOSIS — I2699 Other pulmonary embolism without acute cor pulmonale: Secondary | ICD-10-CM

## 2011-05-15 LAB — POCT INR: INR: 2

## 2011-05-21 ENCOUNTER — Other Ambulatory Visit: Payer: Self-pay | Admitting: Family Medicine

## 2011-05-21 NOTE — Telephone Encounter (Signed)
Refill request

## 2011-06-12 ENCOUNTER — Ambulatory Visit (INDEPENDENT_AMBULATORY_CARE_PROVIDER_SITE_OTHER): Payer: Medicare Other | Admitting: *Deleted

## 2011-06-12 ENCOUNTER — Other Ambulatory Visit: Payer: Self-pay | Admitting: Family Medicine

## 2011-06-12 DIAGNOSIS — I2699 Other pulmonary embolism without acute cor pulmonale: Secondary | ICD-10-CM

## 2011-06-12 DIAGNOSIS — Z7901 Long term (current) use of anticoagulants: Secondary | ICD-10-CM

## 2011-06-12 LAB — POCT INR: INR: 1.6

## 2011-06-12 MED ORDER — WARFARIN SODIUM 5 MG PO TABS: 5.0000 mg | ORAL_TABLET | Freq: Every day | ORAL | Status: DC

## 2011-06-26 ENCOUNTER — Ambulatory Visit (INDEPENDENT_AMBULATORY_CARE_PROVIDER_SITE_OTHER): Payer: Medicare Other | Admitting: *Deleted

## 2011-06-26 DIAGNOSIS — I2699 Other pulmonary embolism without acute cor pulmonale: Secondary | ICD-10-CM

## 2011-06-26 DIAGNOSIS — Z7901 Long term (current) use of anticoagulants: Secondary | ICD-10-CM

## 2011-06-26 LAB — POCT INR: INR: 2

## 2011-07-10 ENCOUNTER — Ambulatory Visit (INDEPENDENT_AMBULATORY_CARE_PROVIDER_SITE_OTHER): Payer: Medicare Other | Admitting: Home Health Services

## 2011-07-10 ENCOUNTER — Ambulatory Visit (INDEPENDENT_AMBULATORY_CARE_PROVIDER_SITE_OTHER): Payer: Medicare Other | Admitting: *Deleted

## 2011-07-10 ENCOUNTER — Encounter: Payer: Self-pay | Admitting: Home Health Services

## 2011-07-10 VITALS — BP 124/76 | HR 71 | Temp 97.6°F | Ht 67.0 in | Wt 279.2 lb

## 2011-07-10 DIAGNOSIS — Z7901 Long term (current) use of anticoagulants: Secondary | ICD-10-CM

## 2011-07-10 DIAGNOSIS — I2699 Other pulmonary embolism without acute cor pulmonale: Secondary | ICD-10-CM

## 2011-07-10 DIAGNOSIS — Z0000 Encounter for general adult medical examination without abnormal findings: Secondary | ICD-10-CM

## 2011-07-10 LAB — POCT INR: INR: 2.2

## 2011-07-10 NOTE — Progress Notes (Signed)
Patient here for annual wellness visit, patient reports: Risk Factors/Conditions needing evaluation or treatment: Pt does not have any risk factors that need evaluation. Home Safety: Pt lives with 2 daughters in 1 story home.  Pt reports having smoke detectors but does not have adaptive equipment in bathroom. Other Information: Corrective lens: Pt wears daily corrective lens visits with eye doctor annually. Dentures: Pt has full dentures on top, partial on bottom.  Does not regularly visit dentist.  Memory: Pt denies memory problems. Patient's Mini Mental Score (recorded in doc. flowsheet): 29  Balance/Gait:  Balance Abnormal Patient value  Sitting balance    Sit to stand x Uses arms  Attempts to arise    Immediate standing balance    Standing balance    Nudge    Eyes closed- Romberg    Tandem stance    Back lean    Neck Rotation    360 degree turn    Sitting down x Uses arms   Gait Abnormal Patient value  Initiation of gait    Step length-left    Step length-right    Step height-left    Step height-right    Step symmetry    Step continuity    Path deviation    Trunk movement    Walking stance x Bent over slightly      Annual Wellness Visit Requirements Recorded Today In  Medical, family, social history Past Medical, Family, Social History Section  Current providers Care team  Current medications Medications  Wt, BP, Ht, BMI Vital signs  Hearing assessment (welcome visit) Hearing/vision  Tobacco, alcohol, illicit drug use History  ADL Nurse Assessment  Depression Screening Nurse Assessment  Cognitive impairment Nurse Assessment  Mini Mental Status Document Flowsheet  Fall Risk Nurse Assessment  Home Safety Progress Note  End of Life Planning (welcome visit) Social Documentation  Medicare preventative services Progress Note  Risk factors/conditions needing evaluation/treatment Progress Note  Personalized health advice Patient Instructions, goals, letter  Diet  & Exercise Social Documentation  Emergency Contact Social Documentation  Seat Belts Social Documentation  Sun exposure/protection Social Documentation    Prevention Plan:   Recommended Medicare Prevention Screenings Women over 65 Test For Frequency Date of Last- BOLD if needed  Breast Cancer 1-2 yrs 4/11  Cervical Cancer 1-3 yrs Not indicated due to age  Colorectal Cancer 1-10 yrs declined  Osteoporosis once discussed  Cholesterol 5 yrs 4/12  Diabetes yearly 10/12  HIV yearly declined  Influenza Shot yearly 10/12  Pneumonia Shot once 8/07  Zostavax Shot once 10/12   ORTON,JONATHAN 07/14/2011 9:27 AM  I have reviewed this visit and discussed with Arlys John and agree with her documentation

## 2011-07-11 ENCOUNTER — Encounter: Payer: Self-pay | Admitting: Home Health Services

## 2011-07-11 NOTE — Patient Instructions (Signed)
1. Continue to exercise daily. 2. Consider focusing on increasing the vegetables you can eat and fruit every day. 3. Take your time walking to avoid falls. 4. Continue to take all medications as prescribed.  5. Limit fried foods and sweets.  6. It is time to schedule your 3 month follow up with Dr. Rivka Safer.

## 2011-07-14 ENCOUNTER — Encounter: Payer: Self-pay | Admitting: Home Health Services

## 2011-07-25 ENCOUNTER — Ambulatory Visit (INDEPENDENT_AMBULATORY_CARE_PROVIDER_SITE_OTHER): Payer: Medicare Other | Admitting: *Deleted

## 2011-07-25 DIAGNOSIS — Z7901 Long term (current) use of anticoagulants: Secondary | ICD-10-CM

## 2011-07-25 DIAGNOSIS — I2699 Other pulmonary embolism without acute cor pulmonale: Secondary | ICD-10-CM

## 2011-07-25 LAB — POCT INR: INR: 2.5

## 2011-08-01 ENCOUNTER — Ambulatory Visit (INDEPENDENT_AMBULATORY_CARE_PROVIDER_SITE_OTHER): Payer: Medicare Other | Admitting: Family Medicine

## 2011-08-01 ENCOUNTER — Telehealth: Payer: Self-pay | Admitting: *Deleted

## 2011-08-01 ENCOUNTER — Encounter: Payer: Self-pay | Admitting: Family Medicine

## 2011-08-01 VITALS — BP 118/64 | HR 70 | Temp 98.7°F | Ht 67.0 in | Wt 277.0 lb

## 2011-08-01 DIAGNOSIS — I2699 Other pulmonary embolism without acute cor pulmonale: Secondary | ICD-10-CM

## 2011-08-01 MED ORDER — RIVAROXABAN 20 MG PO TABS: 20.0000 mg | ORAL_TABLET | Freq: Every day | ORAL | Status: DC

## 2011-08-01 MED ORDER — RIVAROXABAN 15 MG PO TABS: 15.0000 mg | ORAL_TABLET | Freq: Two times a day (BID) | ORAL | Status: DC

## 2011-08-01 NOTE — Telephone Encounter (Signed)
PA required for Xarelto. MD completed form and it is faxed to  Insurance.

## 2011-08-01 NOTE — Patient Instructions (Signed)
It was great to see you today!  Schedule an appointment to see me in three months for a yearly physical.  Start taking xeralto 15 mg tabs twice a day, for three weeks. Last dose will be on March 22nd. Then start taking 20 mg tabs of xeralto once a day. Starting March 23rd.  Stop taking coumadin.  You are ok to fly to [place], make sure you walk on the plane.  Nice to see you today.

## 2011-08-01 NOTE — Assessment & Plan Note (Signed)
I will switch patient to Xarelto and stop coumadin. She will take 15 mg of xarelto BID for 3 weeks then switch to 20 mg daily. She was given samples and a ten day free card. She has insurance. DC coumadin. Last level less than 3.  She will follow up in 3 months for routine yearly physical. OK to fly, advised to get up during the flight twice to walk.

## 2011-08-01 NOTE — Progress Notes (Signed)
  Subjective:    Patient ID: Erin Esparza, female    DOB: 11-Jan-1941, 71 y.o.   MRN: [id number]  HPI 1. Discuss flying to [place] and coumadin Patient takes coumadin for hx of PE and DVT. Her INR has been well controlled in the 2's. Last on 07/25/11 was 2.5. She is flying to [place] to see her brother who is ill. Denies chest pain, sob, leg pain, leg swelling.   Review of Systems See hpi No fever/weight loss    Objective:   Physical Exam Heart - Regular rate and rhythm.  No murmurs, gallops or rubs.    Lungs:  Normal respiratory effort, chest expands symmetrically. Lungs are clear to auscultation, no crackles or wheezes. Extremities:  No cyanosis, no edema (has compression hose), or deformity noted. Pulses palpable. Non tender. General: aaf, nad, aox3    Assessment & Plan:

## 2011-08-01 NOTE — Telephone Encounter (Signed)
Approval received from insurance , pharmacy notified.

## 2011-08-04 ENCOUNTER — Telehealth: Payer: Self-pay | Admitting: Family Medicine

## 2011-08-04 NOTE — Telephone Encounter (Signed)
Thinks she has a hemorrhoid and is not sure she needs to come in.  Needs to talk to nurse  Also has a question about Xarelto

## 2011-08-04 NOTE — Telephone Encounter (Signed)
Patient states  She has a hemorrhoid and she had some itching and irritated it and when she wipes gets a spot of blood on the tissue.  Consulted with Dr. Leveda Anna and he advises that patient does need to be seen in a week or so  to follow up with this . She is to continue Xarelto . Sitz bath , prep H . Call back if bleeding worsens. Appointment scheduled 03/11.

## 2011-08-11 ENCOUNTER — Ambulatory Visit: Payer: Medicare Other | Admitting: Family Medicine

## 2011-08-22 ENCOUNTER — Ambulatory Visit: Payer: Medicare Other

## 2011-10-13 ENCOUNTER — Other Ambulatory Visit: Payer: Self-pay | Admitting: Family Medicine

## 2011-10-13 DIAGNOSIS — Z1231 Encounter for screening mammogram for malignant neoplasm of breast: Secondary | ICD-10-CM

## 2011-10-20 ENCOUNTER — Ambulatory Visit: Payer: Medicare Other

## 2011-10-23 ENCOUNTER — Ambulatory Visit
Admission: RE | Admit: 2011-10-23 | Discharge: 2011-10-23 | Disposition: A | Discharge status: Home / Self Care | Payer: Medicare Other | Source: Ambulatory Visit | Attending: Family Medicine | Admitting: Family Medicine

## 2011-10-23 DIAGNOSIS — Z1231 Encounter for screening mammogram for malignant neoplasm of breast: Secondary | ICD-10-CM

## 2011-10-24 ENCOUNTER — Telehealth: Payer: Self-pay | Admitting: Family Medicine

## 2011-10-24 DIAGNOSIS — R928 Other abnormal and inconclusive findings on diagnostic imaging of breast: Secondary | ICD-10-CM

## 2011-10-24 NOTE — Telephone Encounter (Signed)
The breast center will contact patient to set up

## 2011-10-24 NOTE — Telephone Encounter (Signed)
Discussed results of mammogram with patient. Please call her back to set up her diagnostic left mammogram and the breast center.

## 2011-10-24 NOTE — Telephone Encounter (Signed)
Attempted to discuss mammogram findings, both numbers not working.  Ordered diagnostic of left breast. Please contact patient again to set this up.  Thank you,

## 2011-10-28 ENCOUNTER — Other Ambulatory Visit: Payer: Self-pay | Admitting: Family Medicine

## 2011-10-28 DIAGNOSIS — R928 Other abnormal and inconclusive findings on diagnostic imaging of breast: Secondary | ICD-10-CM

## 2011-10-29 ENCOUNTER — Other Ambulatory Visit: Payer: Self-pay | Admitting: Family Medicine

## 2011-10-29 ENCOUNTER — Ambulatory Visit
Admission: RE | Admit: 2011-10-29 | Discharge: 2011-10-29 | Disposition: A | Discharge status: Home / Self Care | Payer: Medicare Other | Source: Ambulatory Visit | Attending: Family Medicine | Admitting: Family Medicine

## 2011-10-29 DIAGNOSIS — R928 Other abnormal and inconclusive findings on diagnostic imaging of breast: Secondary | ICD-10-CM

## 2011-11-03 ENCOUNTER — Ambulatory Visit
Admission: RE | Admit: 2011-11-03 | Discharge: 2011-11-03 | Disposition: A | Discharge status: Home / Self Care | Payer: Medicare Other | Source: Ambulatory Visit | Attending: Family Medicine | Admitting: Family Medicine

## 2011-11-03 ENCOUNTER — Other Ambulatory Visit: Payer: Self-pay | Admitting: Radiology

## 2011-11-03 DIAGNOSIS — R928 Other abnormal and inconclusive findings on diagnostic imaging of breast: Secondary | ICD-10-CM

## 2011-11-03 HISTORY — PX: BREAST BIOPSY: SHX20

## 2011-11-04 ENCOUNTER — Telehealth: Payer: Self-pay | Admitting: *Deleted

## 2011-11-04 NOTE — Telephone Encounter (Signed)
Dr. Renato Gails from [facility] called and left message on Physicians voiceline  to have Dr. Rivka Safer call him about patient. Paged Dr. Rivka Safer but unable to reach. Notified Dr. Renato Gails and he advises to just have Dr. Rivka Safer see addendum on note  regarding biopsy. Can Call Dr. Renato Gails if needed tomorrow afternoon at cell # [phone]

## 2011-11-04 NOTE — Telephone Encounter (Signed)
Dr. Rivka Safer is post call today . Will give him message tomorrow.

## 2011-11-05 ENCOUNTER — Telehealth: Payer: Self-pay | Admitting: Family Medicine

## 2011-11-05 DIAGNOSIS — N63 Unspecified lump in unspecified breast: Secondary | ICD-10-CM

## 2011-11-07 ENCOUNTER — Ambulatory Visit (INDEPENDENT_AMBULATORY_CARE_PROVIDER_SITE_OTHER): Payer: Medicare Other | Admitting: Surgery

## 2011-12-02 ENCOUNTER — Encounter (INDEPENDENT_AMBULATORY_CARE_PROVIDER_SITE_OTHER): Payer: Self-pay | Admitting: Surgery

## 2011-12-02 ENCOUNTER — Ambulatory Visit (INDEPENDENT_AMBULATORY_CARE_PROVIDER_SITE_OTHER): Payer: Medicare Other | Admitting: Surgery

## 2011-12-02 VITALS — BP 132/78 | HR 70 | Temp 97.1°F | Resp 16 | Ht 67.0 in | Wt 286.2 lb

## 2011-12-02 DIAGNOSIS — N6320 Unspecified lump in the left breast, unspecified quadrant: Secondary | ICD-10-CM

## 2011-12-02 DIAGNOSIS — N63 Unspecified lump in unspecified breast: Secondary | ICD-10-CM

## 2011-12-02 NOTE — Patient Instructions (Addendum)
Lumpectomy, Breast Conserving Surgery A lumpectomy is breast surgery that removes only part of the breast. Another name used may be partial mastectomy. The amount removed varies. Make sure you understand how much of your breast will be removed. Reasons for a lumpectomy:  Any solid breast mass.   Grouped significant nodularity that may be confused with a solitary breast mass.  Lumpectomy is the most common form of breast cancer surgery today. The surgeon removes the portion of your breast which contains the tumor (cancer). This is the lump. Some normal tissue around the lump is also removed to be sure that all the tumor has been removed.  If cancer cells are found in the margins where the breast tissue was removed, your surgeon will do more surgery to remove the remaining cancer tissue. This is called re-excision surgery. Radiation and/or chemotherapy treatments are often given following a lumpectomy to kill any cancer cells that could possibly remain.  REASONS YOU MAY NOT BE ABLE TO HAVE BREAST CONSERVING SURGERY:  The tumor is located in more than one place.   Your breast is small and the tumor is large so the breast would be disfigured.   The entire tumor removal is not successful with a lumpectomy.   You cannot commit to a full course of chemotherapy, radiation therapy or are pregnant and cannot have radiation.   You have previously had radiation to the breast to treat cancer.  HOW A LUMPECTOMY IS PERFORMED If overnight nursing is not required following a biopsy, a lumpectomy can be performed as a same-day surgery. This can be done in a hospital, clinic, or surgical center. The anesthesia used will depend on your surgeon. They will discuss this with you. A general anesthetic keeps you sleeping through the procedure. LET YOUR CAREGIVERS KNOW ABOUT THE FOLLOWING:  Allergies   Medications taken including herbs, eye drops, over the counter medications, and creams.   Use of steroids (by  mouth or creams)   Previous problems with anesthetics or Novocaine.   Possibility of pregnancy, if this applies   History of blood clots (thrombophlebitis)   History of bleeding or blood problems.   Previous surgery   Other health problems  BEFORE THE PROCEDURE You should be present one hour prior to your procedure unless directed otherwise.  AFTER THE PROCEDURE  After surgery, you will be taken to the recovery area where a nurse will watch and check your progress. Once you're awake, stable, and taking fluids well, barring other problems you will be allowed to go home.   Ice packs applied to your operative site may help with discomfort and keep the swelling down.   A small rubber drain may be placed in the breast for a couple of days to prevent a hematoma from developing in the breast.   A pressure dressing may be applied for 24 to 48 hours to prevent bleeding.   Keep the wound dry.   You may resume a normal diet and activities as directed. Avoid strenuous activities affecting the arm on the side of the biopsy site such as tennis, swimming, heavy lifting (more than 10 pounds) or pulling.   Bruising in the breast is normal following this procedure.   Wearing a bra - even to bed - may be more comfortable and also help keep the dressing on.   Change dressings as directed.   Only take over-the-counter or prescription medicines for pain, discomfort, or fever as directed by your caregiver.  Call for your results as   instructed by your surgeon. Remember it is your responsibility to get the results of your lumpectomy if your surgeon asked you to follow-up. Do not assume everything is fine if you have not heard from your caregiver. SEEK MEDICAL CARE IF:   There is increased bleeding (more than a small spot) from the wound.   You notice redness, swelling, or increasing pain in the wound.   Pus is coming from wound.   An unexplained oral temperature above 102 F (38.9 C) develops.     You notice a foul smell coming from the wound or dressing.  SEEK IMMEDIATE MEDICAL CARE IF:   You develop a rash.   You have difficulty breathing.   You have any allergic problems.  Document Released: 06/30/2006 Document Revised: 05/08/2011 Document Reviewed: 10/01/2006 [employer] Patient Information 2012 [employer]. Lumpectomy, Breast Conserving Surgery Care After Please read the instructions outlined below and refer to this sheet in the next few weeks. These discharge instructions provide you with general information on caring for yourself after you leave the hospital. Your surgeon may also give you specific instructions. While your treatment has been planned according to the most current medical practices available, unavoidable complications occasionally occur. If you have any problems or questions after discharge, please call your surgeon. Reasons for a lumpectomy:  Any solid breast mass.   Grouped significant nodularity that may be confused with a solitary breast mass.  AFTER THE PROCEDURE  After surgery, you will be taken to the recovery area where a nurse will watch and check your progress. Once you're awake, stable, and taking fluids well, barring other problems you will be allowed to go home.   Ice packs applied to your operative site may help with discomfort and keep the swelling down.   A small rubber drain may be placed in the incision for a couple of days to prevent a hematoma in the breast.   A pressure dressing may be applied for 24 to 48 hours to prevent bleeding.   Keep the wound dry.   You may resume a normal diet and activities as directed. Avoid strenuous activities affecting the arm on the side of the biopsy site such as tennis, swimming, heavy lifting (more than 10 pounds) or pulling.   Bruising in the breast is normal following this procedure.   Wearing a bra - even to bed - may be more comfortable and also help keep the dressing on.   Change  dressings as directed.   Only take over-the-counter or prescription medicines for pain, discomfort, or fever as directed by your caregiver.  Call for your results as instructed by your surgeon. Remember it isyour responsibility to get the results of your lumpectomy if your surgeon asked you to follow-up. Do not assume everything is fine if you have not heard from your caregiver. SEEK MEDICAL CARE IF:   There is increased bleeding (more than a small spot) from the wound.   You notice redness, swelling, or increasing pain in the wound.   Pus is coming from wound.   An unexplained oral temperature above 102 F (38.9 C) develops.   You notice a foul smell coming from the wound or dressing.  SEEK IMMEDIATE MEDICAL CARE IF:   You develop a rash.   You have difficulty breathing.   You have any allergic problems.  Document Released: 06/04/2006 Document Revised: 05/08/2011 Document Reviewed: 05/07/2007 [employer] Patient Information 2012 [employer]. 

## 2011-12-02 NOTE — Progress Notes (Signed)
Patient ID: Erin Esparza, female   DOB: 07-26-1940, 71 y.o.   MRN: [id number]  Chief Complaint  Patient presents with  . Mass    Breast    HPI Erin Esparza is a 71 y.o. female.   HPIPatient sent at the request of Dr. Jean Rosenthal due to left breast mass. This found on recent screening mammography in the left central upper breast. Core biopsy with ultrasound guidance was attempted and was unsuccessful. She is too large to undergo a stereotactic biopsy. She is in his request for left breast open needle localized excisional biopsy. She denies history of breast pain, breast mass or nipple discharge. No change in the appearance of the breast.  Past Medical History  Diagnosis Date  . Pulmonary embolism   . DVT (deep venous thrombosis)   . Hypertension   . Diastolic congestive heart failure   . Venous stasis of lower extremity   . Asthma   . Right ankle pain   . Insulin resistance   . Clotting disorder   . Breast mass in female     History reviewed. No pertinent past surgical history.  Family History  Problem Relation Age of Onset  . Heart disease Mother   . Cancer Sister     breast    Social History History  Substance Use Topics  . Smoking status: Former Smoker -- 0.3 packs/day for 15 years    Types: Cigarettes    Quit date: 06/02/1997  . Smokeless tobacco: Never Used  . Alcohol Use: No    No Known Allergies  Current Outpatient Prescriptions  Medication Sig Dispense Refill  . ADVAIR DISKUS 100-50 MCG/DOSE AEPB INHALE 1 PUFF TWICE A DAY AS DIRECTED  60 each  6  . aspirin 81 MG tablet Take 1 tablet (81 mg total) by mouth daily.  90 tablet  11  . enalapril (VASOTEC) 20 MG tablet Take 1 tablet (20 mg total) by mouth daily.  30 tablet  11  . Fluticasone-Salmeterol (ADVAIR DISKUS) 100-50 MCG/DOSE AEPB Inhale 1 puff into the lungs 2 (two) times daily.  60 each  11  . furosemide (LASIX) 40 MG tablet Take 1 tablet (40 mg total) by mouth daily as needed.  30 tablet  11  .  hydrochlorothiazide (HYDRODIURIL) 25 MG tablet Take 1 tablet (25 mg total) by mouth daily.  30 tablet  11  . Rivaroxaban (XARELTO) 15 MG TABS tablet Take 1 tablet (15 mg total) by mouth 2 (two) times daily.  42 tablet  0  . Rivaroxaban 20 MG TABS Take 20 mg by mouth daily.  30 tablet  11    Review of Systems Review of Systems  Constitutional: Negative for fever, chills and unexpected weight change.  HENT: Negative for hearing loss, congestion, sore throat, trouble swallowing and voice change.   Eyes: Negative for visual disturbance.  Respiratory: Negative for cough and wheezing.   Cardiovascular: Negative for chest pain, palpitations and leg swelling.  Gastrointestinal: Negative for nausea, vomiting, abdominal pain, diarrhea, constipation, blood in stool, abdominal distention and anal bleeding.  Genitourinary: Negative for hematuria, vaginal bleeding and difficulty urinating.  Musculoskeletal: Negative for arthralgias.  Skin: Negative for rash and wound.  Neurological: Negative for seizures, syncope and headaches.  Hematological: Negative for adenopathy. Does not bruise/bleed easily.  Psychiatric/Behavioral: Negative for confusion.    Blood pressure 132/78, pulse 70, temperature 97.1 F (36.2 C), temperature source Temporal, resp. rate 16, height 5\' 7"  (1.702 m), weight 286 lb 4 oz (129.842 kg).  Physical Exam Physical Exam  Constitutional: She is oriented to person, place, and time. She appears well-developed and well-nourished.  HENT:  Head: Normocephalic and atraumatic.  Eyes: EOM are normal. Pupils are equal, round, and reactive to light.  Neck: Normal range of motion. Neck supple.  Cardiovascular: Normal rate and regular rhythm.   Pulmonary/Chest: Effort normal and breath sounds normal.       Both breasts examined. No mass is noted neither breast. Both axilla normal.  Musculoskeletal: Normal range of motion.  Neurological: She is alert and oriented to person, place, and time.    Skin: Skin is warm and dry.  Psychiatric: She has a normal mood and affect. Her behavior is normal. Judgment and thought content normal.    Data Reviewed Mammogram ,  LEFT BREAST U/S  1 CM LEFT BREAST MASS.  U/s GUIDED BIOPSY MISSED TARGET PER MAMMOGRAM  Assessment    Left breast mass Inability to biopsy due to patient size with failed U/S guided attempt    Plan    Recommend needle localized left breast excision biopsy to evaluate suspicious mass.The procedure has been discussed with the patient.  Alternative therapies have been discussed with the patient.  Operative risks include bleeding,  Infection,  Organ injury,  Nerve injury,  Blood vessel injury,  DVT,  Pulmonary embolism,  Death,  And possible reoperation.  Medical management risks include worsening of present situation.  The success of the procedure is 50 -90 % at treating patients symptoms.  The patient understands and agrees to proceed.       CORNETT,THOMAS A. 12/02/2011, 4:46 PM

## 2011-12-08 ENCOUNTER — Other Ambulatory Visit (INDEPENDENT_AMBULATORY_CARE_PROVIDER_SITE_OTHER): Payer: Self-pay | Admitting: Surgery

## 2011-12-08 DIAGNOSIS — N6320 Unspecified lump in the left breast, unspecified quadrant: Secondary | ICD-10-CM

## 2011-12-17 ENCOUNTER — Telehealth: Payer: Self-pay | Admitting: Family Medicine

## 2011-12-17 NOTE — Telephone Encounter (Signed)
Patient is calling to speak to the nurse about her medication that she is on and how that will affect her scheduled surgery.

## 2011-12-17 NOTE — Telephone Encounter (Signed)
Told to hold ASA for 5 days prior to surgery and hold xarelto the day before and to start the night after she has had the surgery. Had pt to repeat the recommendation and assured of understanding .Loralee Pacas [place]

## 2012-01-01 ENCOUNTER — Encounter (HOSPITAL_BASED_OUTPATIENT_CLINIC_OR_DEPARTMENT_OTHER): Payer: Self-pay | Admitting: *Deleted

## 2012-01-01 NOTE — Progress Notes (Signed)
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

## 2012-01-02 ENCOUNTER — Ambulatory Visit
Admission: RE | Admit: 2012-01-02 | Discharge: 2012-01-02 | Disposition: A | Discharge status: Home / Self Care | Payer: Medicare Other | Source: Ambulatory Visit | Attending: Surgery | Admitting: Surgery

## 2012-01-02 ENCOUNTER — Encounter (HOSPITAL_BASED_OUTPATIENT_CLINIC_OR_DEPARTMENT_OTHER)
Admission: RE | Admit: 2012-01-02 | Discharge: 2012-01-02 | Disposition: A | Discharge status: Home / Self Care | Payer: Medicare Other | Source: Ambulatory Visit

## 2012-01-02 LAB — COMPREHENSIVE METABOLIC PANEL
ALT: 8 U/L (ref 0–35)
AST: 13 U/L (ref 0–37)
Albumin: 3.4 g/dL — ABNORMAL LOW (ref 3.5–5.2)
Alkaline Phosphatase: 77 U/L (ref 39–117)
BUN: 15 mg/dL (ref 6–23)
CO2: 28 mEq/L (ref 19–32)
Calcium: 9.4 mg/dL (ref 8.4–10.5)
Chloride: 100 mEq/L (ref 96–112)
Creatinine, Ser: 0.75 mg/dL (ref 0.50–1.10)
GFR calc Af Amer: >90 mL/min (ref >90)
GFR calc non Af Amer: 83 mL/min — ABNORMAL LOW (ref >90)
Glucose, Bld: 106 mg/dL — ABNORMAL HIGH (ref 70–99)
Potassium: 3.9 mEq/L (ref 3.5–5.1)
Sodium: 138 mEq/L (ref 135–145)
Total Bilirubin: 0.3 mg/dL (ref 0.3–1.2)
Total Protein: 7.7 g/dL (ref 6.0–8.3)

## 2012-01-02 LAB — DIFFERENTIAL
Basophils Absolute: 0 10*3/uL (ref 0.0–0.1)
Basophils Relative: 0 % (ref 0–1)
Eosinophils Absolute: 0.3 10*3/uL (ref 0.0–0.7)
Eosinophils Relative: 4 % (ref 0–5)
Lymphocytes Relative: 18 % (ref 12–46)
Lymphs Abs: 1.5 10*3/uL (ref 0.7–4.0)
Monocytes Absolute: 0.6 10*3/uL (ref 0.1–1.0)
Monocytes Relative: 7 % (ref 3–12)
Neutro Abs: 6.1 10*3/uL (ref 1.7–7.7)
Neutrophils Relative %: 72 % (ref 43–77)

## 2012-01-02 LAB — CBC
HCT: 36.5 % (ref 36.0–46.0)
Hemoglobin: 12 g/dL (ref 12.0–15.0)
MCH: 30.5 pg (ref 26.0–34.0)
MCHC: 32.9 g/dL (ref 30.0–36.0)
MCV: 92.6 fL (ref 78.0–100.0)
Platelets: 239 10*3/uL (ref 150–400)
RBC: 3.94 MIL/uL (ref 3.87–5.11)
RDW: 13.4 % (ref 11.5–15.5)
WBC: 8.6 10*3/uL (ref 4.0–10.5)

## 2012-01-05 ENCOUNTER — Encounter (HOSPITAL_BASED_OUTPATIENT_CLINIC_OR_DEPARTMENT_OTHER): Payer: Self-pay | Admitting: *Deleted

## 2012-01-05 NOTE — Progress Notes (Signed)
Dr. Justin Mend reviewed chart and ok for surgery here

## 2012-01-06 ENCOUNTER — Ambulatory Visit
Admission: RE | Admit: 2012-01-06 | Discharge: 2012-01-06 | Disposition: A | Discharge status: Home / Self Care | Payer: Medicare Other | Source: Ambulatory Visit | Attending: Surgery | Admitting: Surgery

## 2012-01-06 ENCOUNTER — Ambulatory Visit (HOSPITAL_BASED_OUTPATIENT_CLINIC_OR_DEPARTMENT_OTHER): Payer: Medicare Other | Admitting: Anesthesiology

## 2012-01-06 ENCOUNTER — Encounter (HOSPITAL_BASED_OUTPATIENT_CLINIC_OR_DEPARTMENT_OTHER): Payer: Self-pay | Admitting: Anesthesiology

## 2012-01-06 ENCOUNTER — Encounter (HOSPITAL_BASED_OUTPATIENT_CLINIC_OR_DEPARTMENT_OTHER): Payer: Self-pay | Admitting: *Deleted

## 2012-01-06 ENCOUNTER — Encounter (HOSPITAL_BASED_OUTPATIENT_CLINIC_OR_DEPARTMENT_OTHER)
Admission: RE | Disposition: A | Discharge status: Home / Self Care | Payer: Self-pay | Source: Ambulatory Visit | Attending: Surgery

## 2012-01-06 ENCOUNTER — Ambulatory Visit (HOSPITAL_BASED_OUTPATIENT_CLINIC_OR_DEPARTMENT_OTHER)
Admission: RE | Admit: 2012-01-06 | Discharge: 2012-01-06 | Disposition: A | Discharge status: Home / Self Care | Payer: Medicare Other | Source: Ambulatory Visit | Attending: Surgery | Admitting: Surgery

## 2012-01-06 ENCOUNTER — Encounter (HOSPITAL_BASED_OUTPATIENT_CLINIC_OR_DEPARTMENT_OTHER): Payer: Self-pay | Admitting: Surgery

## 2012-01-06 DIAGNOSIS — Z01810 Encounter for preprocedural cardiovascular examination: Secondary | ICD-10-CM | POA: Insufficient documentation

## 2012-01-06 DIAGNOSIS — N6320 Unspecified lump in the left breast, unspecified quadrant: Secondary | ICD-10-CM

## 2012-01-06 DIAGNOSIS — I509 Heart failure, unspecified: Secondary | ICD-10-CM | POA: Insufficient documentation

## 2012-01-06 DIAGNOSIS — I10 Essential (primary) hypertension: Secondary | ICD-10-CM | POA: Insufficient documentation

## 2012-01-06 DIAGNOSIS — C50219 Malignant neoplasm of upper-inner quadrant of unspecified female breast: Secondary | ICD-10-CM | POA: Insufficient documentation

## 2012-01-06 DIAGNOSIS — C50919 Malignant neoplasm of unspecified site of unspecified female breast: Secondary | ICD-10-CM

## 2012-01-06 DIAGNOSIS — Z01812 Encounter for preprocedural laboratory examination: Secondary | ICD-10-CM | POA: Insufficient documentation

## 2012-01-06 DIAGNOSIS — D0590 Unspecified type of carcinoma in situ of unspecified breast: Secondary | ICD-10-CM

## 2012-01-06 HISTORY — DX: Malignant neoplasm of unspecified site of unspecified female breast: C50.919

## 2012-01-06 HISTORY — DX: Presence of spectacles and contact lenses: Z97.3

## 2012-01-06 HISTORY — DX: Presence of dental prosthetic device (complete) (partial): Z97.2

## 2012-01-06 HISTORY — PX: BREAST LUMPECTOMY: SHX2

## 2012-01-06 HISTORY — DX: Unspecified osteoarthritis, unspecified site: M19.90

## 2012-01-06 HISTORY — DX: Shortness of breath: R06.02

## 2012-01-06 LAB — POCT HEMOGLOBIN-HEMACUE: Hemoglobin: 12.2 g/dL (ref 12.0–15.0)

## 2012-01-06 SURGERY — BREAST LUMPECTOMY WITH NEEDLE LOCALIZATION
Anesthesia: General | Site: Breast | Laterality: Left | Wound class: Clean

## 2012-01-06 MED ORDER — ONDANSETRON HCL 4 MG/2ML IJ SOLN
INTRAMUSCULAR | Status: DC | PRN
  Administered 2012-01-06: 4 mg via INTRAVENOUS

## 2012-01-06 MED ORDER — DEXAMETHASONE SODIUM PHOSPHATE 4 MG/ML IJ SOLN
INTRAMUSCULAR | Status: DC | PRN
  Administered 2012-01-06: 4 mg via INTRAVENOUS

## 2012-01-06 MED ORDER — HYDROCODONE-ACETAMINOPHEN 5-325 MG PO TABS: 1.0000 | ORAL_TABLET | Freq: Four times a day (QID) | ORAL | Status: AC | PRN

## 2012-01-06 MED ORDER — HYDROMORPHONE HCL PF 1 MG/ML IJ SOLN: 0.2500 mg | INTRAMUSCULAR | Status: DC | PRN

## 2012-01-06 MED ORDER — ACETAMINOPHEN 10 MG/ML IV SOLN
1000.0000 mg | Freq: Once | INTRAVENOUS | Status: AC
  Administered 2012-01-06: 1000 mg via INTRAVENOUS

## 2012-01-06 MED ORDER — LIDOCAINE HCL (CARDIAC) 20 MG/ML IV SOLN
INTRAVENOUS | Status: DC | PRN
  Administered 2012-01-06: 100 mg via INTRAVENOUS

## 2012-01-06 MED ORDER — CHLORHEXIDINE GLUCONATE 4 % EX LIQD: 1.0000 "application " | Freq: Once | CUTANEOUS | Status: DC

## 2012-01-06 MED ORDER — METOCLOPRAMIDE HCL 5 MG/ML IJ SOLN: 10.0000 mg | Freq: Once | INTRAMUSCULAR | Status: DC | PRN

## 2012-01-06 MED ORDER — CEFAZOLIN SODIUM 10 G IJ SOLR
3.0000 g | INTRAMUSCULAR | Status: AC
  Administered 2012-01-06: 3 g via INTRAVENOUS

## 2012-01-06 MED ORDER — BUPIVACAINE-EPINEPHRINE 0.25% -1:200000 IJ SOLN
INTRAMUSCULAR | Status: DC | PRN
  Administered 2012-01-06: 20 mL

## 2012-01-06 MED ORDER — LACTATED RINGERS IV SOLN
INTRAVENOUS | Status: DC
  Administered 2012-01-06: 13:00:00 via INTRAVENOUS

## 2012-01-06 MED ORDER — OXYCODONE HCL 5 MG/5ML PO SOLN: 5.0000 mg | Freq: Once | ORAL | Status: DC | PRN

## 2012-01-06 MED ORDER — OXYCODONE HCL 5 MG PO TABS: 5.0000 mg | ORAL_TABLET | Freq: Once | ORAL | Status: DC | PRN

## 2012-01-06 MED ORDER — METOCLOPRAMIDE HCL 5 MG/ML IJ SOLN
INTRAMUSCULAR | Status: DC | PRN
  Administered 2012-01-06: 10 mg via INTRAVENOUS

## 2012-01-06 MED ORDER — FENTANYL CITRATE 0.05 MG/ML IJ SOLN
INTRAMUSCULAR | Status: DC | PRN
  Administered 2012-01-06: 25 ug via INTRAVENOUS
  Administered 2012-01-06: 50 ug via INTRAVENOUS

## 2012-01-06 MED ORDER — PROPOFOL 10 MG/ML IV EMUL
INTRAVENOUS | Status: DC | PRN
  Administered 2012-01-06: 300 mg via INTRAVENOUS

## 2012-01-06 SURGICAL SUPPLY — 42 items
ADH SKN CLS APL DERMABOND .7 (GAUZE/BANDAGES/DRESSINGS) ×1
BLADE SURG 15 STRL LF DISP TIS (BLADE) ×1 IMPLANT
BLADE SURG 15 STRL SS (BLADE) ×2
CANISTER SUCTION 1200CC (MISCELLANEOUS) ×2 IMPLANT
CHLORAPREP W/TINT 26ML (MISCELLANEOUS) ×2 IMPLANT
CLIP TI WIDE RED SMALL 6 (CLIP) IMPLANT
CLOTH BEACON ORANGE TIMEOUT ST (SAFETY) ×2 IMPLANT
COVER MAYO STAND STRL (DRAPES) ×2 IMPLANT
COVER TABLE BACK 60X90 (DRAPES) ×2 IMPLANT
DECANTER SPIKE VIAL GLASS SM (MISCELLANEOUS) ×1 IMPLANT
DERMABOND ADVANCED (GAUZE/BANDAGES/DRESSINGS) ×1
DERMABOND ADVANCED .7 DNX12 (GAUZE/BANDAGES/DRESSINGS) ×1 IMPLANT
DEVICE DUBIN W/COMP PLATE 8390 (MISCELLANEOUS) ×1 IMPLANT
DRAPE LAPAROSCOPIC ABDOMINAL (DRAPES) IMPLANT
DRAPE PED LAPAROTOMY (DRAPES) ×2 IMPLANT
DRAPE UTILITY XL STRL (DRAPES) ×2 IMPLANT
ELECT COATED BLADE 2.86 ST (ELECTRODE) ×2 IMPLANT
ELECT REM PT RETURN 9FT ADLT (ELECTROSURGICAL) ×2
ELECTRODE REM PT RTRN 9FT ADLT (ELECTROSURGICAL) ×1 IMPLANT
GLOVE BIOGEL PI IND STRL 8 (GLOVE) ×1 IMPLANT
GLOVE BIOGEL PI INDICATOR 8 (GLOVE) ×1
GLOVE ECLIPSE 6.5 STRL STRAW (GLOVE) ×1 IMPLANT
GLOVE ECLIPSE 8.0 STRL XLNG CF (GLOVE) ×2 IMPLANT
GOWN PREVENTION PLUS XLARGE (GOWN DISPOSABLE) ×3 IMPLANT
KIT MARKER MARGIN INK (KITS) IMPLANT
NDL HYPO 25X1 1.5 SAFETY (NEEDLE) ×1 IMPLANT
NEEDLE HYPO 25X1 1.5 SAFETY (NEEDLE) ×2 IMPLANT
NS IRRIG 1000ML POUR BTL (IV SOLUTION) ×2 IMPLANT
PACK BASIN DAY SURGERY FS (CUSTOM PROCEDURE TRAY) ×2 IMPLANT
PENCIL BUTTON HOLSTER BLD 10FT (ELECTRODE) ×2 IMPLANT
SLEEVE SCD COMPRESS KNEE MED (MISCELLANEOUS) ×2 IMPLANT
SPONGE LAP 4X18 X RAY DECT (DISPOSABLE) ×2 IMPLANT
SUT MON AB 4-0 PC3 18 (SUTURE) ×2 IMPLANT
SUT SILK 2 0 SH (SUTURE) ×1 IMPLANT
SUT VIC AB 3-0 SH 27 (SUTURE) ×2
SUT VIC AB 3-0 SH 27X BRD (SUTURE) ×1 IMPLANT
SYR CONTROL 10ML LL (SYRINGE) ×2 IMPLANT
TOWEL OR 17X24 6PK STRL BLUE (TOWEL DISPOSABLE) ×3 IMPLANT
TOWEL OR NON WOVEN STRL DISP B (DISPOSABLE) ×2 IMPLANT
TUBE CONNECTING 20X1/4 (TUBING) ×2 IMPLANT
WATER STERILE IRR 1000ML POUR (IV SOLUTION) IMPLANT
YANKAUER SUCT BULB TIP NO VENT (SUCTIONS) ×2 IMPLANT

## 2012-01-06 NOTE — H&P (Signed)
Erin Esparza   MRN: [id number]   Description: 71 year old female  Provider: Dortha Schwalbe., MD  Department: [facility]        Diagnoses     Left breast mass   - Primary    611.72      Reason for Visit     Mass    Breast        Vitals - Last Recorded       BP Pulse Temp Resp Ht Wt    132/78 70 97.1 F (36.2 C) (Temporal) 16 5\' 7"  (1.702 m) 286 lb 4 oz (129.842 kg)         BMI              44.83 kg/m2                 Progress Notes     Patient ID: Erin Esparza, female   DOB: [birth year], 71 y.o.   MRN: [id number]    Chief Complaint   Patient presents with   .  Mass       Breast      HPI Erin Esparza is a 71 y.o. female.   HPIPatient sent at the request of Dr. Jean Rosenthal due to left breast mass. This found on recent screening mammography in the left central upper breast. Core biopsy with ultrasound guidance was attempted and was unsuccessful. She is too large to undergo a stereotactic biopsy. She is in his request for left breast open needle localized excisional biopsy. She denies history of breast pain, breast mass or nipple discharge. No change in the appearance of the breast.    Past Medical History   Diagnosis  Date   .  Pulmonary embolism     .  DVT (deep venous thrombosis)     .  Hypertension     .  Diastolic congestive heart failure     .  Venous stasis of lower extremity     .  Asthma     .  Right ankle pain     .  Insulin resistance     .  Clotting disorder     .  Breast mass in female        History reviewed. No pertinent past surgical history.    Family History   Problem  Relation  Age of Onset   .  Heart disease  Mother     .  Cancer  Sister         breast      Social History History   Substance Use Topics   .  Smoking status:  Former Smoker -- 0.3 packs/day for 15 years       Types:  Cigarettes       Quit date:  06/02/1997   .  Smokeless tobacco:  Never Used   .  Alcohol Use:  No      No Known Allergies    Current  Outpatient Prescriptions   Medication  Sig  Dispense  Refill   .  ADVAIR DISKUS 100-50 MCG/DOSE AEPB  INHALE 1 PUFF TWICE A DAY AS DIRECTED   60 each   6   .  aspirin 81 MG tablet  Take 1 tablet (81 mg total) by mouth daily.   90 tablet   11   .  enalapril (VASOTEC) 20 MG tablet  Take 1 tablet (20 mg total) by mouth daily.   30 tablet   11   .  Fluticasone-Salmeterol (ADVAIR DISKUS) 100-50 MCG/DOSE AEPB  Inhale 1 puff into the lungs 2 (two) times daily.   60 each   11   .  furosemide (LASIX) 40 MG tablet  Take 1 tablet (40 mg total) by mouth daily as needed.   30 tablet   11   .  hydrochlorothiazide (HYDRODIURIL) 25 MG tablet  Take 1 tablet (25 mg total) by mouth daily.   30 tablet   11   .  Rivaroxaban (XARELTO) 15 MG TABS tablet  Take 1 tablet (15 mg total) by mouth 2 (two) times daily.   42 tablet   0   .  Rivaroxaban 20 MG TABS  Take 20 mg by mouth daily.   30 tablet   11      Review of Systems Review of Systems  Constitutional: Negative for fever, chills and unexpected weight change.  HENT: Negative for hearing loss, congestion, sore throat, trouble swallowing and voice change.   Eyes: Negative for visual disturbance.  Respiratory: Negative for cough and wheezing.   Cardiovascular: Negative for chest pain, palpitations and leg swelling.  Gastrointestinal: Negative for nausea, vomiting, abdominal pain, diarrhea, constipation, blood in stool, abdominal distention and anal bleeding.  Genitourinary: Negative for hematuria, vaginal bleeding and difficulty urinating.  Musculoskeletal: Negative for arthralgias.  Skin: Negative for rash and wound.  Neurological: Negative for seizures, syncope and headaches.  Hematological: Negative for adenopathy. Does not bruise/bleed easily.  Psychiatric/Behavioral: Negative for confusion.    Blood pressure 132/78, pulse 70, temperature 97.1 F (36.2 C), temperature source Temporal, resp. rate 16, height 5\' 7"  (1.702 m), weight 286 lb 4 oz (129.842  kg).   Physical Exam Physical Exam  Constitutional: She is oriented to person, place, and time. She appears well-developed and well-nourished.  HENT:   Head: Normocephalic and atraumatic.  Eyes: EOM are normal. Pupils are equal, round, and reactive to light.  Neck: Normal range of motion. Neck supple.  Cardiovascular: Normal rate and regular rhythm.   Pulmonary/Chest: Effort normal and breath sounds normal.       Both breasts examined. No mass is noted neither breast. Both axilla normal.  Musculoskeletal: Normal range of motion.  Neurological: She is alert and oriented to person, place, and time.  Skin: Skin is warm and dry.  Psychiatric: She has a normal mood and affect. Her behavior is normal. Judgment and thought content normal.    Data Reviewed Mammogram ,  LEFT BREAST U/S  1 CM LEFT BREAST MASS.  U/s GUIDED BIOPSY MISSED TARGET PER MAMMOGRAM   Assessment Left breast mass Inability to biopsy due to patient size with failed U/S guided attempt   Plan Recommend needle localized left breast excision biopsy to evaluate suspicious mass.The procedure has been discussed with the patient.  Alternative therapies have been discussed with the patient.  Operative risks include bleeding,  Infection,  Organ injury,  Nerve injury,  Blood vessel injury,  DVT,  Pulmonary embolism,  Death,  And possible reoperation.  Medical management risks include worsening of present situation. The success of the procedure is 50 -90 % at treating patients symptoms.  The patient understands and agrees to proceed.       CORNETT,THOMAS A. 01/06/2012

## 2012-01-06 NOTE — Anesthesia Preprocedure Evaluation (Signed)
Anesthesia Evaluation  Patient identified by MRN, date of birth, ID band Patient awake    Reviewed: Allergy & Precautions, H&P , NPO status , Patient's Chart, lab work & pertinent test results, reviewed documented beta blocker date and time   Airway Mallampati: II TM Distance: >3 FB Neck ROM: full    Dental   Pulmonary shortness of breath and with exertion, asthma ,  breath sounds clear to auscultation        Cardiovascular hypertension, Pt. on medications +CHF Rhythm:regular     Neuro/Psych negative neurological ROS  negative psych ROS   GI/Hepatic negative GI ROS, Neg liver ROS,   Endo/Other  negative endocrine ROS  Renal/GU negative Renal ROS  negative genitourinary   Musculoskeletal   Abdominal   Peds  Hematology negative hematology ROS (+)   Anesthesia Other Findings See surgeon's H&P   Reproductive/Obstetrics negative OB ROS                           Anesthesia Physical Anesthesia Plan  ASA: III  Anesthesia Plan: General   Post-op Pain Management:    Induction: Intravenous  Airway Management Planned: LMA  Additional Equipment:   Intra-op Plan:   Post-operative Plan: Extubation in OR  Informed Consent: I have reviewed the patients History and Physical, chart, labs and discussed the procedure including the risks, benefits and alternatives for the proposed anesthesia with the patient or authorized representative who has indicated his/her understanding and acceptance.   Dental Advisory Given  Plan Discussed with: CRNA and Surgeon  Anesthesia Plan Comments:         Anesthesia Quick Evaluation

## 2012-01-06 NOTE — Interval H&P Note (Signed)
History and Physical Interval Note:  01/06/2012 12:38 PM  Erin Esparza  has presented today for surgery, with the diagnosis of left breast mass  The various methods of treatment have been discussed with the patient and family. After consideration of risks, benefits and other options for treatment, the patient has consented to  Procedure(s) (LRB): BREAST LUMPECTOMY WITH NEEDLE LOCALIZATION (Left) as a surgical intervention .  The patient's history has been reviewed, patient examined, no change in status, stable for surgery.  I have reviewed the patient's chart and labs.  Questions were answered to the patient's satisfaction.     CORNETT,THOMAS A.

## 2012-01-06 NOTE — Anesthesia Postprocedure Evaluation (Signed)
Anesthesia Post Note  Patient: Erin Esparza  Procedure(s) Performed: Procedure(s) (LRB): BREAST LUMPECTOMY WITH NEEDLE LOCALIZATION (Left)  Anesthesia type: General  Patient location: PACU  Post pain: Pain level controlled  Post assessment: Patient's Cardiovascular Status Stable  Last Vitals:  Filed Vitals:   01/06/12 1400  BP: 122/57  Pulse: 71  Temp:   Resp: 14    Post vital signs: Reviewed and stable  Level of consciousness: alert  Complications: No apparent anesthesia complications

## 2012-01-06 NOTE — Op Note (Signed)
Left partial Mastectomy with needle localization  Indications: This patient presents with history of a left breast mass. Given the clinical history and physical exam, along with indicated diagnostic studies, breast biopsy will be performed.  Pre-operative Diagnosis: left breast mass  Post-operative Diagnosis: left breast mass  Surgeon: CORNETT,THOMAS A.   Assistants: OR  Anesthesia: General LMA anesthesia and Local anesthesia 0.25.% bupivacaine, with epinephrine  ASA Class: 3  Procedure Details  The patient was seen in the Holding Room. The risks, benefits, complications, treatment options, and expected outcomes were discussed with the patient. The possibilities of reaction to medication, pulmonary aspiration, bleeding, infection, the need for additional procedures, failure to diagnose a condition, and creating a complication requiring transfusion or operation were discussed with the patient. The patient concurred with the proposed plan, giving informed consent. The site of surgery properly noted/marked. The patient was taken to Operating Room, identified as Erin Esparza, and the procedure verified as lumpectomy. A Time Out was held and the above information confirmed.  After induction of anesthesia, the left breast and chest were prepped and draped in standard fashion. The lumpectomy was performed by creating an oblique incision over the upper inner quadrant of the breast. Hemostasis was achieved with cautery. Margins were grossly clear. The wound was irrigated and closed with a 3-0 Vicryl  AND 40 monocryl subcuticular closure in layers.  Specimen oriented.    Sterile dressings were applied. At the end of the operation, all sponge, instrument, and needle counts were correct.  Findings: grossly clear surgical margins  Estimated Blood Loss:  Minimal         Drains: NONE         Total IV Fluids:  800 mL         Specimens: breast mass             Complications:  None; patient tolerated  the procedure well.         Disposition: PACU - hemodynamically stable.         Condition: Stable

## 2012-01-06 NOTE — Anesthesia Procedure Notes (Signed)
Procedure Name: LMA Insertion Date/Time: 01/06/2012 1:07 PM Performed by: Meyer Russel Pre-anesthesia Checklist: Patient identified, Emergency Drugs available, Suction available and Patient being monitored Patient Re-evaluated:Patient Re-evaluated prior to inductionOxygen Delivery Method: Circle System Utilized Preoxygenation: Pre-oxygenation with 100% oxygen Intubation Type: IV induction Ventilation: Mask ventilation without difficulty LMA: LMA with gastric port inserted LMA Size: 4.0 Number of attempts: 1 Placement Confirmation: positive ETCO2 and breath sounds checked- equal and bilateral Tube secured with: Tape Dental Injury: Teeth and Oropharynx as per pre-operative assessment

## 2012-01-06 NOTE — Transfer of Care (Signed)
Immediate Anesthesia Transfer of Care Note  Patient: Erin Esparza  Procedure(s) Performed: Procedure(s) (LRB): BREAST LUMPECTOMY WITH NEEDLE LOCALIZATION (Left)  Patient Location: PACU  Anesthesia Type: General  Level of Consciousness: awake and oriented  Airway & Oxygen Therapy: Patient Spontanous Breathing and Patient connected to face mask oxygen  Post-op Assessment: Report given to PACU RN, Post -op Vital signs reviewed and stable and Patient moving all extremities  Post vital signs: Reviewed and stable  Complications: No apparent anesthesia complications

## 2012-01-12 ENCOUNTER — Telehealth (INDEPENDENT_AMBULATORY_CARE_PROVIDER_SITE_OTHER): Payer: Self-pay | Admitting: General Surgery

## 2012-01-12 ENCOUNTER — Encounter: Payer: Self-pay | Admitting: Family Medicine

## 2012-01-12 DIAGNOSIS — D0510 Intraductal carcinoma in situ of unspecified breast: Secondary | ICD-10-CM

## 2012-01-12 HISTORY — DX: Intraductal carcinoma in situ of unspecified breast: D05.10

## 2012-01-12 NOTE — Telephone Encounter (Signed)
Patient calling for pathology results. Please call as soon as possible. [phone]. Removal breast mass on 01/06/12, it looks like this was not confirmed malignant on her preoperative visit.

## 2012-01-13 NOTE — Telephone Encounter (Signed)
Done yesterday at 3 pm

## 2012-01-14 ENCOUNTER — Other Ambulatory Visit (INDEPENDENT_AMBULATORY_CARE_PROVIDER_SITE_OTHER): Payer: Self-pay | Admitting: Surgery

## 2012-01-14 DIAGNOSIS — C50912 Malignant neoplasm of unspecified site of left female breast: Secondary | ICD-10-CM

## 2012-01-16 ENCOUNTER — Telehealth: Payer: Self-pay | Admitting: *Deleted

## 2012-01-16 NOTE — Telephone Encounter (Signed)
Confirmed 02/12/12 appt w/ pt.  Mailed before appt letter & packet to pt.  Emailed Music therapist at [facility] to make her aware.  Took paperwork to Med Rec for chart.

## 2012-01-17 ENCOUNTER — Other Ambulatory Visit: Payer: Self-pay | Admitting: Family Medicine

## 2012-01-20 ENCOUNTER — Other Ambulatory Visit: Payer: Self-pay | Admitting: *Deleted

## 2012-01-20 DIAGNOSIS — C50219 Malignant neoplasm of upper-inner quadrant of unspecified female breast: Secondary | ICD-10-CM

## 2012-01-20 DIAGNOSIS — Z853 Personal history of malignant neoplasm of breast: Secondary | ICD-10-CM | POA: Insufficient documentation

## 2012-01-23 ENCOUNTER — Ambulatory Visit
Admission: RE | Admit: 2012-01-23 | Discharge: 2012-01-23 | Disposition: A | Discharge status: Home / Self Care | Payer: Medicare Other | Source: Ambulatory Visit | Attending: Radiation Oncology | Admitting: Radiation Oncology

## 2012-01-23 ENCOUNTER — Encounter: Payer: Self-pay | Admitting: *Deleted

## 2012-01-23 ENCOUNTER — Encounter: Payer: Self-pay | Admitting: Radiation Oncology

## 2012-01-23 VITALS — BP 155/64 | HR 86 | Temp 97.5°F | Resp 20 | Wt 295.2 lb

## 2012-01-23 DIAGNOSIS — Z87891 Personal history of nicotine dependence: Secondary | ICD-10-CM | POA: Insufficient documentation

## 2012-01-23 DIAGNOSIS — I10 Essential (primary) hypertension: Secondary | ICD-10-CM | POA: Insufficient documentation

## 2012-01-23 DIAGNOSIS — Z7982 Long term (current) use of aspirin: Secondary | ICD-10-CM | POA: Insufficient documentation

## 2012-01-23 DIAGNOSIS — L988 Other specified disorders of the skin and subcutaneous tissue: Secondary | ICD-10-CM | POA: Insufficient documentation

## 2012-01-23 DIAGNOSIS — J45909 Unspecified asthma, uncomplicated: Secondary | ICD-10-CM | POA: Insufficient documentation

## 2012-01-23 DIAGNOSIS — Z79899 Other long term (current) drug therapy: Secondary | ICD-10-CM | POA: Insufficient documentation

## 2012-01-23 DIAGNOSIS — Z86711 Personal history of pulmonary embolism: Secondary | ICD-10-CM | POA: Insufficient documentation

## 2012-01-23 DIAGNOSIS — C50219 Malignant neoplasm of upper-inner quadrant of unspecified female breast: Secondary | ICD-10-CM | POA: Insufficient documentation

## 2012-01-23 DIAGNOSIS — I5030 Unspecified diastolic (congestive) heart failure: Secondary | ICD-10-CM | POA: Insufficient documentation

## 2012-01-23 DIAGNOSIS — I509 Heart failure, unspecified: Secondary | ICD-10-CM | POA: Insufficient documentation

## 2012-01-23 DIAGNOSIS — Z510 Encounter for antineoplastic radiation therapy: Secondary | ICD-10-CM | POA: Insufficient documentation

## 2012-01-23 DIAGNOSIS — Z86718 Personal history of other venous thrombosis and embolism: Secondary | ICD-10-CM | POA: Insufficient documentation

## 2012-01-23 NOTE — Progress Notes (Signed)
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

## 2012-01-23 NOTE — Progress Notes (Signed)
Please see the Nurse Progress Note in the MD Initial Consult Encounter for this patient. 

## 2012-01-26 NOTE — Progress Notes (Addendum)
Radiation Oncology         [phone] ________________________________  Name: RAMANDEEP ARINGTON MRN: [id number]  Date: 01/23/2012  DOB: [birth year]  WU:[id number], Erin Batten, MD  Cornett, Erin Pu., MD   Pierce Crane, MD  REFERRING PHYSICIAN: Luisa Esparza, Erin Pu., MD   DIAGNOSIS: The encounter diagnosis was Cancer of upper-inner quadrant of female breast.   HISTORY OF PRESENT ILLNESS::Erin Esparza is a 71 y.o. female who is seen for an initial consultation visit. The patient is seen regarding a recent diagnosis of left-sided breast cancer. The patient was initially found to have a suspicious abnormality within the left breast on screening mammogram on 10/23/2011. She therefore proceeded to undergo a diagnostic mammogram on 10/29/2011. This showed a suspicious mass at the 10:00 position, 7 cm from the left nipple. Ultrasound was performed and showed an irregular hypoechoic mass measuring 7 x 6 x 5 mm. Biopsy was performed and this showed benign atrophic breast parenchyma. There was no evidence of malignancy.  These findings were felt to be discordant and the patient therefore proceeded with a needle localized excisional biopsy through Dr. Luisa Esparza. Pathology revealed a microscopic foci of invasive ductal carcinoma in the setting of DCIS which was grade 2. No lymphovascular space invasion was seen. The margins were negative. Receptor studies have indicated that the tumor is ER positive, PR positive, and HER-2/neu negative. The proliferative marker was 5%. No lymph nodes were evaluated.  Patient is seen today therefore for consideration of adjuvant radiotherapy.   PREVIOUS RADIATION THERAPY: No   PAST MEDICAL HISTORY:  has a past medical history of Pulmonary embolism; DVT (deep venous thrombosis); Hypertension; Venous stasis of lower extremity; Asthma; Right ankle pain; Insulin resistance; Clotting disorder; Breast mass in female; Shortness of breath; Wears glasses; Wears dentures; Wears partial dentures;  Arthritis; Diastolic congestive heart failure; and Breast cancer (01/06/12).     PAST SURGICAL HISTORY: Past Surgical History  Procedure Date  . No past surgeries   . Breast biopsy 11/03/11    left breast bx 10 o'clock=benign breast parenchyma  . Breast lumpectomy 01/06/12    Left breast/ Dr. Harriette Bouillon     FAMILY HISTORY: family history includes Cancer in her sister and Heart disease in her mother.   SOCIAL HISTORY:  reports that she quit smoking about 14 years ago. Her smoking use included Cigarettes. She has a 4.5 pack-year smoking history. She has never used smokeless tobacco. She reports that she does not drink alcohol or use illicit drugs.   ALLERGIES: Review of patient's allergies indicates no known allergies.   MEDICATIONS:  Current Outpatient Prescriptions  Medication Sig Dispense Refill  . acetaminophen (TYLENOL) 500 MG tablet Take 500 mg by mouth every 6 (six) hours as needed.      Marland Kitchen ADVAIR DISKUS 100-50 MCG/DOSE AEPB INHALE 1 PUFF TWICE A DAY AS DIRECTED  1 each  6  . aspirin 81 MG tablet Take 1 tablet (81 mg total) by mouth daily.  90 tablet  11  . enalapril (VASOTEC) 20 MG tablet Take 1 tablet (20 mg total) by mouth daily.  30 tablet  11  . furosemide (LASIX) 40 MG tablet Take 1 tablet (40 mg total) by mouth daily as needed.  30 tablet  11  . hydrochlorothiazide (HYDRODIURIL) 25 MG tablet Take 1 tablet (25 mg total) by mouth daily.  30 tablet  11  . Rivaroxaban 20 MG TABS Take 20 mg by mouth daily.  30 tablet  11  . Rivaroxaban (XARELTO) 15  MG TABS tablet Take 1 tablet (15 mg total) by mouth 2 (two) times daily.  42 tablet  0     REVIEW OF SYSTEMS:  A 15 point review of systems is documented in the electronic medical record. This was obtained by the nursing staff. However, I reviewed this with the patient to discuss relevant findings and make appropriate changes.  Pertinent items are noted in HPI.    PHYSICAL EXAM:  weight is 295 lb 3.2 oz (133.902 kg). Her oral  temperature is 97.5 F (36.4 C). Her blood pressure is 155/64 and her pulse is 86. Her respiration is 20.   General: Well-developed, in no acute distress HEENT: Normocephalic, atraumatic; oral cavity clear Neck: Supple without any lymphadenopathy Cardiovascular: Regular rate and rhythm Respiratory: Clear to auscultation bilaterally Breasts: Healing surgical incision present. No suspicious additional findings within the left breast and no axillary adenopathy present. No breast masses or axillary adenopathy on the right. GI: Soft, nontender, normal bowel sounds, obese Extremities: No edema present Neuro: No focal deficits    LABORATORY DATA:  Lab Results  Component Value Date   WBC 8.6 01/02/2012   HGB 12.2 01/06/2012   HCT 36.5 01/02/2012   MCV 92.6 01/02/2012   PLT 239 01/02/2012   Lab Results  Component Value Date   NA 138 01/02/2012   K 3.9 01/02/2012   CL 100 01/02/2012   CO2 28 01/02/2012   Lab Results  Component Value Date   ALT 8 01/02/2012   AST 13 01/02/2012   ALKPHOS 77 01/02/2012   BILITOT 0.3 01/02/2012      RADIOGRAPHY: Chest 2 View  01/02/2012  *RADIOLOGY REPORT*  Clinical Data: Preoperative evaluation for breast mass.  No current chest complaints.  Nonsmoker  CHEST - 2 VIEW  Comparison: 03/13/2011 and 04/27/2009  Findings: Heart and mediastinal contours are within normal limits. An area of focal linear density at the left lung base is stable in comparison with exams dating back to 2010 and most likely represents an area of focal parenchymal scarring.  No new focal infiltrates or signs of congestive failure are noted. No pleural fluid or significant peribronchial cuffing is seen.  Bony structures appear intact.  IMPRESSION: Stable left basilar scarring.  No new focal or acute cardiopulmonary abnormality identified  Original Report Authenticated By: Bertha Stakes, M.D.   Mm Breast Surgical Specimen  01/06/2012  *RADIOLOGY REPORT*  Clinical Data:  Recent ultrasound guided core needle  biopsy of a mass seen mammographically in the medial left breast with discordant pathology results and discordant clip position.  The patient is too large for stereotactic guided core needle biopsy. She exceeds the stereotactic biopsy table weight limit.  NEEDLE LOCALIZATION WITH MAMMOGRAPHIC GUIDANCE AND SPECIMEN RADIOGRAPH  The patient presents for needle localization prior to left breast excisional biopsy.  I met with the patient and we discussed the procedure of needle localization including risks.  Specifically, we discussed the risks of bleeding and infection.  Informed written consent was given.  Using mammographic guidance, sterile technique, local anesthesia and a 7 cm localization needle, the recently demonstrated mass in the medial left breast was localized using medial approach.  The images were marked for Dr. Luisa Esparza.  Specimen radiograph was performed at [facility], and demonstrates the wire tip and mass present in the tissue sample.  The specimen was marked for pathology.  IMPRESSION: Needle localization left breast.  No apparent complications.  Original Report Authenticated By: Darrol Angel, M.D.   Mm  Breast Wire Localization Left  01/06/2012  *RADIOLOGY REPORT*  Clinical Data:  Recent ultrasound guided core needle biopsy of a mass seen mammographically in the medial left breast with discordant pathology results and discordant clip position.  The patient is too large for stereotactic guided core needle biopsy. She exceeds the stereotactic biopsy table weight limit.  NEEDLE LOCALIZATION WITH MAMMOGRAPHIC GUIDANCE AND SPECIMEN RADIOGRAPH  The patient presents for needle localization prior to left breast excisional biopsy.  I met with the patient and we discussed the procedure of needle localization including risks.  Specifically, we discussed the risks of bleeding and infection.  Informed written consent was given.  Using mammographic guidance, sterile technique, local anesthesia and a 7 cm  localization needle, the recently demonstrated mass in the medial left breast was localized using medial approach.  The images were marked for Dr. Luisa Esparza.  Specimen radiograph was performed at [facility], and demonstrates the wire tip and mass present in the tissue sample.  The specimen was marked for pathology.  IMPRESSION: Needle localization left breast.  No apparent complications.  Original Report Authenticated By: Darrol Angel, M.D.       IMPRESSION: The patient is a 71 year old female status post an excisional biopsy with negative margins for a focus of invasive ductal carcinoma in the setting of DCIS, pathologic stage [id number].   PLAN: The patient is an appropriate candidate to consider adjuvant radiotherapy for her left-sided breast cancer. I discussed with her the rationale of such a treatment to reduce local regional failure. We also discussed what would be entailed in a four-week course of treatment for her and the side effects and risks.  We also discussed additional options including no treatment and what this difference might make. The patient is in her early 24s and she states that she remains quite active and she was interested in adjuvant treatment.  Patient is also scheduled to see Dr. Donnie Esparza in a couple of weeks. I will see you she can be put on the schedule for breast conference so we can see what his thoughts are on her overall case and systemic treatment specifically. If no chemotherapy or further evaluation is planned, then I would proceed with adjuvant radiotherapy in the near future.  The patient is anxious to proceed as soon as possible if this is the case and therefore I will try to facilitate this.   I spent 60 minutes minutes face to face with the patient and more than 50% of that time was spent in counseling and/or coordination of care.    ________________________________   Radene Gunning, MD, PhD

## 2012-01-28 ENCOUNTER — Encounter (INDEPENDENT_AMBULATORY_CARE_PROVIDER_SITE_OTHER): Payer: Medicare Other | Admitting: Surgery

## 2012-02-04 ENCOUNTER — Ambulatory Visit: Payer: Medicare Other

## 2012-02-04 ENCOUNTER — Ambulatory Visit
Admission: RE | Admit: 2012-02-04 | Discharge: 2012-02-04 | Disposition: A | Discharge status: Home / Self Care | Payer: Medicare Other | Source: Ambulatory Visit | Attending: Radiation Oncology | Admitting: Radiation Oncology

## 2012-02-04 DIAGNOSIS — C50219 Malignant neoplasm of upper-inner quadrant of unspecified female breast: Secondary | ICD-10-CM

## 2012-02-06 ENCOUNTER — Ambulatory Visit (INDEPENDENT_AMBULATORY_CARE_PROVIDER_SITE_OTHER): Payer: Medicare Other | Admitting: Surgery

## 2012-02-06 ENCOUNTER — Encounter (INDEPENDENT_AMBULATORY_CARE_PROVIDER_SITE_OTHER): Payer: Self-pay | Admitting: Surgery

## 2012-02-06 VITALS — BP 134/70 | HR 74 | Temp 97.9°F | Resp 16 | Ht 67.0 in | Wt 300.1 lb

## 2012-02-06 DIAGNOSIS — Z9889 Other specified postprocedural states: Secondary | ICD-10-CM

## 2012-02-06 NOTE — Patient Instructions (Addendum)
Return in 3 months.

## 2012-02-06 NOTE — Progress Notes (Signed)
Patient returns in followup of the left breast lumpectomy. Pathology showed DCIS with an area of microinvasion. She is doing well. She is seeing radiation pathology and there proceed with radiation therapy. She is to see Dr. Caron Presume oncology. I    Exam: Left breast shows wound is clean, dry and intact. No seroma or signs of infection.  Impression: Stage I left breast cancer ER positive PR positive  Plan: Continued radiation therapy. No sentinel node needed. To see medical oncology next couple of weeks. Return 3 months

## 2012-02-07 NOTE — Addendum Note (Signed)
Encounter addended by: Jonna Coup, MD on: 02/07/2012 11:06 AM<BR>     Documentation filed: Notes Section

## 2012-02-07 NOTE — Progress Notes (Signed)
  Radiation Oncology         [phone] ________________________________  Name: Erin Esparza MRN: [id number]  Date: 02/04/2012  DOB: June 18, 1940  SIMULATION AND TREATMENT PLANNING NOTE  The patient presented for simulation prior to beginning her course of radiation treatment for her diagnosis of left-sided breast cancer. The patient was placed in a supine position on a breast board. A customized accuform device was also constructed and this complex treatment device will be used on a daily basis during her treatment. In this fashion, a CT scan was obtained through the chest area and an isocenter was placed near the chest wall within the left breast. A breath-hold technique was evaluated and this proved helpful in increasing the distance from the target region to the heart - this technique therefore will be used for treatment.  The patient will be planned to receive a course of radiation initially to a dose of 42.5 gray. This will consist of a whole breast radiotherapy technique. To accomplish this, 2 customized blocks have been designed which will correspond to medial and lateral whole breast tangent fields. This treatment will be accomplished at 1.8 gray per fraction. A complex isodose plan is requested to ensure that the breast target area is adequately covered dosimetrically. A forward planning technique will also be evaluated to determine if this approach improves the plan. It is anticipated that the patient will then receive a 7.5 gray boost to the seroma cavity which has been contoured. This will be accomplished at 2 gray per fraction. The final anticipated total dose therefore will correspond to 50 gray.  This initial treatment will consist of a 3-D conformal technique. The seroma has been contoured as the primary target structure. Additionally, dose volume histograms of both this target as well as the lungs and heart will also be evaluated. Such an approach is necessary to ensure that the target  area is adequately covered while the nearby critical  normal structures are adequately spared.   _______________________________   Radene Gunning, MD, PhD

## 2012-02-11 ENCOUNTER — Ambulatory Visit
Admission: RE | Admit: 2012-02-11 | Discharge: 2012-02-11 | Disposition: A | Discharge status: Home / Self Care | Payer: Medicare Other | Source: Ambulatory Visit | Attending: Radiation Oncology | Admitting: Radiation Oncology

## 2012-02-11 DIAGNOSIS — C50219 Malignant neoplasm of upper-inner quadrant of unspecified female breast: Secondary | ICD-10-CM

## 2012-02-11 NOTE — Progress Notes (Signed)
  Radiation Oncology         [phone] ________________________________  Name: Erin Esparza MRN: [id number]  Date: 02/11/2012  DOB: 1941-05-15  Simulation Verification Note   NARRATIVE: The patient was brought to the treatment unit and placed in the planned treatment position. The clinical setup was verified. Then port films were obtained and uploaded to the radiation oncology medical record software.  The treatment beams were carefully compared against the planned radiation fields. The position, location, and shape of the radiation fields was reviewed. The targeted volume of tissue appears to be appropriately covered by the radiation beams. Based on my personal review, I approved the simulation verification. The patient's treatment will proceed as planned.  ________________________________   Radene Gunning, MD, PhD

## 2012-02-12 ENCOUNTER — Ambulatory Visit
Admission: RE | Admit: 2012-02-12 | Discharge: 2012-02-12 | Disposition: A | Discharge status: Home / Self Care | Payer: Medicare Other | Source: Ambulatory Visit | Attending: Radiation Oncology | Admitting: Radiation Oncology

## 2012-02-12 ENCOUNTER — Ambulatory Visit: Payer: Medicare Other

## 2012-02-12 ENCOUNTER — Encounter: Payer: Self-pay | Admitting: Radiation Oncology

## 2012-02-12 ENCOUNTER — Other Ambulatory Visit (HOSPITAL_BASED_OUTPATIENT_CLINIC_OR_DEPARTMENT_OTHER): Payer: Medicare Other

## 2012-02-12 ENCOUNTER — Ambulatory Visit (HOSPITAL_BASED_OUTPATIENT_CLINIC_OR_DEPARTMENT_OTHER): Payer: Medicaid Other | Admitting: Oncology

## 2012-02-12 VITALS — BP 135/75 | HR 79 | Temp 99.5°F | Resp 20 | Ht 66.0 in | Wt 295.1 lb

## 2012-02-12 VITALS — BP 119/70 | HR 77 | Resp 18 | Wt 295.2 lb

## 2012-02-12 DIAGNOSIS — Z803 Family history of malignant neoplasm of breast: Secondary | ICD-10-CM

## 2012-02-12 DIAGNOSIS — C50219 Malignant neoplasm of upper-inner quadrant of unspecified female breast: Secondary | ICD-10-CM

## 2012-02-12 DIAGNOSIS — Z170 Estrogen receptor positive status [ER+]: Secondary | ICD-10-CM

## 2012-02-12 DIAGNOSIS — Z86718 Personal history of other venous thrombosis and embolism: Secondary | ICD-10-CM

## 2012-02-12 LAB — COMPREHENSIVE METABOLIC PANEL (CC13)
ALT: 10 U/L (ref 0–55)
AST: 14 U/L (ref 5–34)
Albumin: 3.3 g/dL — ABNORMAL LOW (ref 3.5–5.0)
Alkaline Phosphatase: 76 U/L (ref 40–150)
BUN: 16 mg/dL (ref 7.0–26.0)
CO2: 26 mEq/L (ref 22–29)
Calcium: 9.4 mg/dL (ref 8.4–10.4)
Chloride: 103 mEq/L (ref 98–107)
Creatinine: 0.9 mg/dL (ref 0.6–1.1)
Glucose: 91 mg/dl (ref 70–99)
Potassium: 3.7 mEq/L (ref 3.5–5.1)
Sodium: 139 mEq/L (ref 136–145)
Total Bilirubin: 0.3 mg/dL (ref 0.20–1.20)
Total Protein: 7.7 g/dL (ref 6.4–8.3)

## 2012-02-12 LAB — CBC WITH DIFFERENTIAL/PLATELET
BASO%: 0.7 % (ref 0.0–2.0)
Basophils Absolute: 0.1 10*3/uL (ref 0.0–0.1)
EOS%: 2.9 % (ref 0.0–7.0)
Eosinophils Absolute: 0.2 10*3/uL (ref 0.0–0.5)
HCT: 35.5 % (ref 34.8–46.6)
HGB: 12 g/dL (ref 11.6–15.9)
LYMPH%: 16.8 % (ref 14.0–49.7)
MCH: 31.3 pg (ref 25.1–34.0)
MCHC: 33.7 g/dL (ref 31.5–36.0)
MCV: 92.8 fL (ref 79.5–101.0)
MONO#: 0.8 10*3/uL (ref 0.1–0.9)
MONO%: 9.2 % (ref 0.0–14.0)
NEUT#: 5.9 10*3/uL (ref 1.5–6.5)
NEUT%: 70.4 % (ref 38.4–76.8)
Platelets: 230 10*3/uL (ref 145–400)
RBC: 3.83 10*6/uL (ref 3.70–5.45)
RDW: 13.3 % (ref 11.2–14.5)
WBC: 8.5 10*3/uL (ref 3.9–10.3)
lymph#: 1.4 10*3/uL (ref 0.9–3.3)

## 2012-02-12 LAB — CANCER ANTIGEN 27.29: CA 27.29: 21 U/mL (ref 0–39)

## 2012-02-12 NOTE — Progress Notes (Signed)
Erin Esparza [id number] 1940/07/02 71 y.o. 02/12/2012 6:03 PM  CC   Edd Arbour, MD [address] [place] [address]  REASON FOR CONSULTATION:   Breast cancer  Patient was seen in the Multidisciplinary Breast Clinic for discussion of her treatment options. She was seen by Dr. Pierce Crane   STAGE: 1 a  No matching staging information was found for the patient.  REFERRING PHYSICIAN: Dr. Elijah Birk Cornett  HISTORY OF PRESENT ILLNESS:  Erin Esparza is a 71 y.o. female.  From [facility] who presents with a new diagnosis of DCIS with microinvasive disease. This patient has undergone annual screening mammography. Screening mammogram performed 10/23/2011 showed an abnormality in the left breast. A diagnostic mammogram performed 10/29/2011 showed a suspicious mass at 10:00 7 cm the nipple. Ultrasound showed a hypoechoic mass measuring 7 x 6 x 5 mm. Biopsy was performed which showed benign parenchyma. The patient underwent a needle localized biopsy which was performed on 10/06/2011. 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 There was associated microinvasive disease. The vast majority the lesion was in fact DCIS with a tiny amount of invasion seen as a cellular level. Patient has been seen by radiation therapy and had her first radiation treatment today.   Past Medical History:  Past Medical History  Diagnosis Date  . Pulmonary embolism  2012   . DVT (deep venous thrombosis)  2002   . Hypertension   . Venous stasis of lower extremity   . Asthma   . Right ankle pain   . Insulin resistance   . Clotting disorder   . Breast mass in female   . Shortness of breath   . Wears glasses   . Wears dentures     upper  . Wears partial dentures     lower  . Arthritis   . Diastolic congestive heart failure   . Breast cancer 01/06/12    left breast invasive ductal ca,dcis,ER/PR=+,    Past Surgical  History:  Past Surgical History  Procedure Date  . No past surgeries   . Breast biopsy 11/03/11    left breast bx 10 o'clock=benign breast parenchyma  . Breast lumpectomy 01/06/12    Left breast/ Dr. Harriette Bouillon    Family History:   Family History  Problem Relation Age of Onset  . Heart disease Mother   . Cancer Sister     breast   she has a total of 11 siblings. 6 sisters in total. 2 of whom had breast cancer 1 her late 60s he underwent an unknown age. She also has 2 nieces who have had breast cancer is no other history of cancer in the family..  Social History  History  Substance Use Topics  . Smoking status: Former Smoker -- 0.3 packs/day for 15 years    Types: Cigarettes    Quit date: 06/02/1997  . Smokeless tobacco: Never Used  . Alcohol Use: No   She is widowed was previously married for 36 years. 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 She has 5 adult children mostly live in [place] area. She has 11 grandchildren and 6 great-grandchildren.  Allergies:  No Known Allergies  Current Medications:  Current Outpatient Prescriptions  Medication Sig Dispense Refill  . acetaminophen (TYLENOL) 500 MG tablet Take 500 mg by mouth every 6 (six) hours as needed.      Marland Kitchen  ADVAIR DISKUS 100-50 MCG/DOSE AEPB INHALE 1 PUFF TWICE A DAY AS DIRECTED  1 each  6  . aspirin 81 MG tablet Take 1 tablet (81 mg total) by mouth daily.  90 tablet  11  . enalapril (VASOTEC) 20 MG tablet Take 1 tablet (20 mg total) by mouth daily.  30 tablet  11  . furosemide (LASIX) 40 MG tablet Take 1 tablet (40 mg total) by mouth daily as needed.  30 tablet  11  . hydrochlorothiazide (HYDRODIURIL) 25 MG tablet Take 1 tablet (25 mg total) by mouth daily.  30 tablet  11  . Rivaroxaban 20 MG TABS Take 20 mg by mouth daily.  30 tablet  11  . Rivaroxaban (XARELTO) 15 MG TABS tablet Take 1 tablet (15 mg total) by mouth 2 (two) times daily.  42 tablet  0     OB/GYN History:G5 P5, menarche age 73, menopause age 22 no history of hormone replacement therapy.  Fertility Discussion:NA  Prior History of Cancer:No  Health Maintenance:  Colonoscopy  Bone Density  no  Last PAP smear   ECOG PERFORMANCE STATUS: 0 - Asymptomatic  Genetic Counseling/testing: This has been scheduled  REVIEW OF SYSTEMS:  A comprehensive review of systems was negative.  PHYSICAL EXAMINATION: Blood pressure 135/75, pulse 79, temperature 99.5 F (37.5 C), temperature source Oral, resp. rate 20, height 5\' 6"  (1.676 m), weight 295 lb 1.6 oz (133.856 kg). Obese woman looking stated age in no acute distress ambulates fairly easily exam room. HEENT:  Sclerae anicteric, conjunctivae pink.  Oropharynx clear.  No mucositis or candidiasis.  Nodes:  No cervical, supraclavicular, or axillary lymphadenopathy palpated.  Breast Exam:  Right breast is benign.  No masses, discharge, skin change, or nipple inversion.  Left breast is stat status post lumpectomy there are radiation markings present. or wheezes.  Heart:  Regular rate and rhythm, soft systolic murmur heard left sternal border..  Abdomen:  Soft, nontender.  Positive bowel sounds.  No organomegaly or masses palpated.  Musculoskeletal:  No focal spinal tenderness to palpation.  Extremities:  Benign.  No peripheral edema or cyanosis.  Skin:  Benign.  Neuro:  Nonfocal.      STUDIES/RESULTS: No results found.   LABS:    Chemistry      Component Value Date/Time   NA 139 02/12/2012 1607   NA 138 01/02/2012 1030   K 3.7 02/12/2012 1607   K 3.9 01/02/2012 1030   CL 103 02/12/2012 1607   CL 100 01/02/2012 1030   CO2 26 02/12/2012 1607   CO2 28 01/02/2012 1030   BUN 16.0 02/12/2012 1607   BUN 15 01/02/2012 1030   CREATININE 0.9 02/12/2012 1607   CREATININE 0.75 01/02/2012 1030      Component Value Date/Time   CALCIUM 9.4 02/12/2012 1607   CALCIUM 9.4 01/02/2012 1030   ALKPHOS 76 02/12/2012 1607   ALKPHOS 77 01/02/2012 1030   AST 14  02/12/2012 1607   AST 13 01/02/2012 1030   ALT 10 02/12/2012 1607   ALT 8 01/02/2012 1030   BILITOT 0.30 02/12/2012 1607   BILITOT 0.3 01/02/2012 1030      Lab Results  Component Value Date   WBC 8.5 02/12/2012   HGB 12.0 02/12/2012   HCT 35.5 02/12/2012   MCV 92.8 02/12/2012   PLT 230 02/12/2012       PATHOLOGY: As above   ASSESSMENT     postmenopausal 71 year old woman who presents with focus of DCIS ER/PR positive and is  a tiny amount of Microvasive disease seen. Status post lumpectomy currently receiving radiation therapy. Strong family history of breast cancer. I discussed the relative merits of adjuvant hormonal therapy. Of note is that she is on long-term xarelto , he has a history of DVT and pulmonary embolism. This is likely to be lifelong. She is a candidate for hormonal therapy in the form of tamoxifen and/or aromatase inhibitor. I will referred for genetics counseling given her strong history for breast cancer in the family. I will plan to see her back in followup after she's completed radiation and has had a bone density test to discuss possibly using tamoxifen or aromatase inhibitor. I discussed side effects of each and its indications in this setting. I think her prognosis is otherwise excellent and she is at low risk for recurrence so we'll certainly need to work at any of her underlying genetic risks.  Clinical Trial Eligibility:   Multidisciplinary conference discussion     PLAN:     plan is as above, completion of radiation  #2 assessment of bone density.  #3 genetics referral.  #4 recommendation to follow for adjuvant hormonal therapy.       Discussion: Patient is being treated per NCCN breast cancer care guidelines appropriate for stage.1   Thank you so much for allowing me to participate in the care of Erin Esparza. I will continue to follow up the patient with you and assist in her care.  All questions were answered. The patient knows to call the clinic with  any problems, questions or concerns. We can certainly see the patient much sooner if necessary.  I spent 20 minutes counseling the patient face to face. The total time spent in the appointment was 40 minutes.      Pierce Crane M.D.  02/12/2012, 6:03 PM

## 2012-02-12 NOTE — Progress Notes (Signed)
Patient presents to the clinic today accompanied by her daughter for a PUT with Dr. Mitzi Hansen. Patient is alert and oriented to person, place, and time. No distress noted. Steady gait noted. Pleasant affect noted. Patient denies pain in her breast at this time. Patient denies nipple discharge. Patient denies skin changes. Patient has no complaints. Reported all findings to Dr. Mitzi Hansen.

## 2012-02-12 NOTE — Progress Notes (Signed)
   Department of Radiation Oncology  Phone:  [phone] Fax:        [phone]  Weekly Treatment Note    Name: Erin Esparza Date: 02/12/2012 MRN: [id number] DOB: 08-08-1940   Current dose: 2.5 Gy  Current fraction: 1   MEDICATIONS: Current Outpatient Prescriptions  Medication Sig Dispense Refill  . acetaminophen (TYLENOL) 500 MG tablet Take 500 mg by mouth every 6 (six) hours as needed.      Marland Kitchen ADVAIR DISKUS 100-50 MCG/DOSE AEPB INHALE 1 PUFF TWICE A DAY AS DIRECTED  1 each  6  . aspirin 81 MG tablet Take 1 tablet (81 mg total) by mouth daily.  90 tablet  11  . enalapril (VASOTEC) 20 MG tablet Take 1 tablet (20 mg total) by mouth daily.  30 tablet  11  . furosemide (LASIX) 40 MG tablet Take 1 tablet (40 mg total) by mouth daily as needed.  30 tablet  11  . hydrochlorothiazide (HYDRODIURIL) 25 MG tablet Take 1 tablet (25 mg total) by mouth daily.  30 tablet  11  . Rivaroxaban 20 MG TABS Take 20 mg by mouth daily.  30 tablet  11  . Rivaroxaban (XARELTO) 15 MG TABS tablet Take 1 tablet (15 mg total) by mouth 2 (two) times daily.  42 tablet  0     ALLERGIES: Review of patient's allergies indicates no known allergies.   LABORATORY DATA:  Lab Results  Component Value Date   WBC 8.6 01/02/2012   HGB 12.2 01/06/2012   HCT 36.5 01/02/2012   MCV 92.6 01/02/2012   PLT 239 01/02/2012   Lab Results  Component Value Date   NA 138 01/02/2012   K 3.9 01/02/2012   CL 100 01/02/2012   CO2 28 01/02/2012   Lab Results  Component Value Date   ALT 8 01/02/2012   AST 13 01/02/2012   ALKPHOS 77 01/02/2012   BILITOT 0.3 01/02/2012     NARRATIVE: Erin Esparza was seen today for weekly treatment management. The chart was checked and the patient's films were reviewed. The patient is doing very well. She completed her first treatment today. No complaints. She has been given skin cream to use on a daily basis.  PHYSICAL EXAMINATION: weight is 295 lb 3.2 oz (133.902 kg). Her blood pressure is 119/70 and her  pulse is 77. Her respiration is 18.        ASSESSMENT: The patient is doing satisfactorily with treatment.  PLAN: We will continue with the patient's radiation treatment as planned.

## 2012-02-13 ENCOUNTER — Encounter: Payer: Self-pay | Admitting: *Deleted

## 2012-02-13 ENCOUNTER — Telehealth: Payer: Self-pay | Admitting: *Deleted

## 2012-02-13 ENCOUNTER — Ambulatory Visit
Admission: RE | Admit: 2012-02-13 | Discharge: 2012-02-13 | Disposition: A | Discharge status: Home / Self Care | Payer: Medicare Other | Source: Ambulatory Visit | Attending: Radiation Oncology | Admitting: Radiation Oncology

## 2012-02-13 NOTE — Telephone Encounter (Signed)
03-11-2012 bone density at the breast center at 10:30am  05-14-2012 starting at 9:00am with lab and md appointment

## 2012-02-13 NOTE — Progress Notes (Signed)
Late entry from 02/12/2012 at 1039. Received patient and her daughter in the clinic today for PUT and post sim education with Sam, Charity fundraiser. Patient is alert and oriented to person, place, and time. No distress noted. Steady gait noted. Pleasant affect noted. Patient denies pain at this time. Oriented patient and her daughter to room, staff, and equipment. Provided patient with [facility]Y [facility] handbook then, reviewed pertinent information. Educated patient on potential side effects and management. Provided patient with Radiaplex and Alra then, directed upon use. Provided patient with this writer's business card and encouraged to call with needs. All questions answered. Patient verbalized understanding.

## 2012-02-13 NOTE — Addendum Note (Signed)
Encounter addended by: Agnes Lawrence, RN on: 02/13/2012  2:04 PM<BR>     Documentation filed: Chief Complaint Section, Inpatient Patient Education, Inpatient Document Flowsheet, Notes Section

## 2012-02-16 ENCOUNTER — Ambulatory Visit
Admission: RE | Admit: 2012-02-16 | Discharge: 2012-02-16 | Disposition: A | Discharge status: Home / Self Care | Payer: Medicare Other | Source: Ambulatory Visit | Attending: Radiation Oncology | Admitting: Radiation Oncology

## 2012-02-17 ENCOUNTER — Ambulatory Visit: Payer: Medicare Other

## 2012-02-18 ENCOUNTER — Ambulatory Visit
Admission: RE | Admit: 2012-02-18 | Discharge: 2012-02-18 | Disposition: A | Discharge status: Home / Self Care | Payer: Medicare Other | Source: Ambulatory Visit | Attending: Radiation Oncology | Admitting: Radiation Oncology

## 2012-02-19 ENCOUNTER — Ambulatory Visit: Admission: RE | Admit: 2012-02-19 | Payer: Medicare Other | Source: Ambulatory Visit | Admitting: Radiation Oncology

## 2012-02-19 ENCOUNTER — Ambulatory Visit
Admission: RE | Admit: 2012-02-19 | Discharge: 2012-02-19 | Disposition: A | Discharge status: Home / Self Care | Payer: Medicare Other | Source: Ambulatory Visit | Attending: Radiation Oncology | Admitting: Radiation Oncology

## 2012-02-20 ENCOUNTER — Ambulatory Visit: Payer: Medicare Other

## 2012-02-23 ENCOUNTER — Ambulatory Visit
Admission: RE | Admit: 2012-02-23 | Discharge: 2012-02-23 | Disposition: A | Discharge status: Home / Self Care | Payer: Medicare Other | Source: Ambulatory Visit | Attending: Radiation Oncology | Admitting: Radiation Oncology

## 2012-02-24 ENCOUNTER — Ambulatory Visit
Admission: RE | Admit: 2012-02-24 | Discharge: 2012-02-24 | Disposition: A | Discharge status: Home / Self Care | Payer: Medicare Other | Source: Ambulatory Visit | Attending: Radiation Oncology | Admitting: Radiation Oncology

## 2012-02-24 NOTE — Addendum Note (Signed)
Encounter addended by: Delynn Flavin, RN on: 02/24/2012 11:43 AM<BR>     Documentation filed: Charges VN

## 2012-02-25 ENCOUNTER — Ambulatory Visit
Admission: RE | Admit: 2012-02-25 | Discharge: 2012-02-25 | Disposition: A | Discharge status: Home / Self Care | Payer: Medicare Other | Source: Ambulatory Visit | Attending: Radiation Oncology | Admitting: Radiation Oncology

## 2012-02-26 ENCOUNTER — Ambulatory Visit
Admission: RE | Admit: 2012-02-26 | Discharge: 2012-02-26 | Disposition: A | Discharge status: Home / Self Care | Payer: Medicare Other | Source: Ambulatory Visit | Attending: Radiation Oncology | Admitting: Radiation Oncology

## 2012-02-26 VITALS — BP 119/55 | HR 68 | Temp 98.4°F | Wt 299.5 lb

## 2012-02-26 DIAGNOSIS — C50219 Malignant neoplasm of upper-inner quadrant of unspecified female breast: Secondary | ICD-10-CM

## 2012-02-26 NOTE — Progress Notes (Signed)
   Department of Radiation Oncology  Phone:  [phone] Fax:        [phone]  Weekly Treatment Note    Name: Erin Esparza Date: 02/26/2012 MRN: [id number] DOB: 08/27/1940   Current dose: 22.5 Gy  Current fraction: 9   MEDICATIONS: Current Outpatient Prescriptions  Medication Sig Dispense Refill  . acetaminophen (TYLENOL) 500 MG tablet Take 500 mg by mouth every 6 (six) hours as needed.      Marland Kitchen ADVAIR DISKUS 100-50 MCG/DOSE AEPB INHALE 1 PUFF TWICE A DAY AS DIRECTED  1 each  6  . aspirin 81 MG tablet Take 1 tablet (81 mg total) by mouth daily.  90 tablet  11  . enalapril (VASOTEC) 20 MG tablet Take 1 tablet (20 mg total) by mouth daily.  30 tablet  11  . furosemide (LASIX) 40 MG tablet Take 1 tablet (40 mg total) by mouth daily as needed.  30 tablet  11  . hydrochlorothiazide (HYDRODIURIL) 25 MG tablet Take 1 tablet (25 mg total) by mouth daily.  30 tablet  11  . non-metallic deodorant (ALRA) MISC Apply 1 application topically daily as needed.      . Rivaroxaban (XARELTO) 15 MG TABS tablet Take 1 tablet (15 mg total) by mouth 2 (two) times daily.  42 tablet  0  . Rivaroxaban 20 MG TABS Take 20 mg by mouth daily.  30 tablet  11  . Wound Cleansers (RADIAPLEX EX) Apply topically.         ALLERGIES: Review of patient's allergies indicates no known allergies.   LABORATORY DATA:  Lab Results  Component Value Date   WBC 8.5 02/12/2012   HGB 12.0 02/12/2012   HCT 35.5 02/12/2012   MCV 92.8 02/12/2012   PLT 230 02/12/2012   Lab Results  Component Value Date   NA 139 02/12/2012   K 3.7 02/12/2012   CL 103 02/12/2012   CO2 26 02/12/2012   Lab Results  Component Value Date   ALT 10 02/12/2012   AST 14 02/12/2012   ALKPHOS 76 02/12/2012   BILITOT 0.30 02/12/2012     NARRATIVE: Erin Esparza was seen today for weekly treatment management. The chart was checked and the patient's films were reviewed. The patient is doing well. Some itching underneath the breast. Her energy is a  little bit down.  PHYSICAL EXAMINATION: weight is 299 lb 8 oz (135.852 kg). Her temperature is 98.4 F (36.9 C). Her blood pressure is 119/55 and her pulse is 68.  diffuse hyperpigmentation. A little bit more irritation underneath the breast.  ASSESSMENT: The patient is doing satisfactorily with treatment.  PLAN: We will continue with the patient's radiation treatment as planned. The patient will begin using hydrocortisone cream in the inframammary region and we'll also continue to use Radioplex cream.

## 2012-02-26 NOTE — Progress Notes (Signed)
Patient here for weekly under treat visit for left breast cancer.Completed 9 41f 20 treatments .Has skin skin discoloration and follicular rash of left mammary fold.Has some itching.Mild fatigue.Denies pain.

## 2012-02-27 ENCOUNTER — Ambulatory Visit
Admission: RE | Admit: 2012-02-27 | Discharge: 2012-02-27 | Disposition: A | Discharge status: Home / Self Care | Payer: Medicare Other | Source: Ambulatory Visit | Attending: Radiation Oncology | Admitting: Radiation Oncology

## 2012-03-01 ENCOUNTER — Ambulatory Visit
Admission: RE | Admit: 2012-03-01 | Discharge: 2012-03-01 | Disposition: A | Discharge status: Home / Self Care | Payer: Medicare Other | Source: Ambulatory Visit | Attending: Radiation Oncology | Admitting: Radiation Oncology

## 2012-03-02 ENCOUNTER — Ambulatory Visit
Admission: RE | Admit: 2012-03-02 | Discharge: 2012-03-02 | Disposition: A | Discharge status: Home / Self Care | Payer: Medicare Other | Source: Ambulatory Visit | Attending: Radiation Oncology | Admitting: Radiation Oncology

## 2012-03-03 ENCOUNTER — Ambulatory Visit
Admission: RE | Admit: 2012-03-03 | Discharge: 2012-03-03 | Disposition: A | Discharge status: Home / Self Care | Payer: Medicare Other | Source: Ambulatory Visit | Attending: Radiation Oncology | Admitting: Radiation Oncology

## 2012-03-04 ENCOUNTER — Ambulatory Visit
Admission: RE | Admit: 2012-03-04 | Discharge: 2012-03-04 | Disposition: A | Discharge status: Home / Self Care | Payer: Medicare Other | Source: Ambulatory Visit | Attending: Radiation Oncology | Admitting: Radiation Oncology

## 2012-03-05 ENCOUNTER — Ambulatory Visit
Admission: RE | Admit: 2012-03-05 | Discharge: 2012-03-05 | Disposition: A | Discharge status: Home / Self Care | Payer: Medicare Other | Source: Ambulatory Visit | Attending: Radiation Oncology | Admitting: Radiation Oncology

## 2012-03-05 ENCOUNTER — Encounter: Payer: Self-pay | Admitting: Family Medicine

## 2012-03-05 ENCOUNTER — Ambulatory Visit (INDEPENDENT_AMBULATORY_CARE_PROVIDER_SITE_OTHER): Payer: Medicare Other | Admitting: Family Medicine

## 2012-03-05 VITALS — BP 132/60 | HR 81 | Temp 97.3°F | Ht 66.0 in | Wt 295.6 lb

## 2012-03-05 DIAGNOSIS — J45909 Unspecified asthma, uncomplicated: Secondary | ICD-10-CM

## 2012-03-05 DIAGNOSIS — C50219 Malignant neoplasm of upper-inner quadrant of unspecified female breast: Secondary | ICD-10-CM

## 2012-03-05 DIAGNOSIS — Z0000 Encounter for general adult medical examination without abnormal findings: Secondary | ICD-10-CM | POA: Insufficient documentation

## 2012-03-05 DIAGNOSIS — I10 Essential (primary) hypertension: Secondary | ICD-10-CM

## 2012-03-05 DIAGNOSIS — M7989 Other specified soft tissue disorders: Secondary | ICD-10-CM

## 2012-03-05 MED ORDER — BIAFINE EX EMUL
CUTANEOUS | Status: DC | PRN
  Administered 2012-03-05: 1 via TOPICAL

## 2012-03-05 NOTE — Addendum Note (Signed)
Encounter addended by: Tessa Lerner, RN on: 03/05/2012 12:23 PM<BR>     Documentation filed: Inpatient MAR

## 2012-03-05 NOTE — Assessment & Plan Note (Signed)
At goal. BP today - 132/60. Will continue Enalapril and HCTZ.

## 2012-03-05 NOTE — Progress Notes (Signed)
Subjective:     Patient ID: Erin Esparza, female   DOB: [birth year], 71 y.o.   MRN: [id number]  HPI Erin Esparza is a pleasant 71 year old who presents for follow up and to establish care with me (I am her new PCP).   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 Erin Esparza has no complaints today but does want to receive her flu shot.  1) HTN - Patient currently on enalapril and HCTZ - Denies headache, increasing LE edema  2) Asthma  - Well controlled on Advair.  3) Lower extremity swelling - Patient using Lasix PRN for edema - Using approximately once a month   Review of Systems Denies SOB, chest pain, increasing LE edema, abdominal pain, N/V, change in appetite, fever, chills.    Objective:   Physical Exam General:  Well appearing, NAD. CV: RRR. No murmurs, rubs, or gallops. Respiratory: CTAB. No rales, rhonchi, or wheeze. Abdomen: obese.  nontender to palpation.  Extremities: non-pitting edema noted bilaterally.    Assessment:         Plan:

## 2012-03-05 NOTE — Assessment & Plan Note (Signed)
Continue Lasix PRN for LE edema. Informed patient to let me know if edema acutely worsens, she becomes increasingly SOB, or begins to take Lasix more frequently.

## 2012-03-05 NOTE — Progress Notes (Signed)
Patient here for routine weekly under treat visit for radiation of left breast.Skin hyperpigmented without peeling.Will give biafine to apply two to three at imes daily.Mild fatigue.

## 2012-03-05 NOTE — Patient Instructions (Addendum)
It was nice meeting you today.  Continue taking all your medications as prescribed.  Good luck with finishing your radiation treatments!  Be sure to follow up with me in 3-6 months or sooner if needed.  Be sure to let me know if you find yourself becoming more SOB or using Lasix more frequently.

## 2012-03-05 NOTE — Assessment & Plan Note (Signed)
Well controlled at this time. Will continue Advair.

## 2012-03-05 NOTE — Progress Notes (Signed)
   Department of Radiation Oncology  Phone:  [phone] Fax:        [phone]  Weekly Treatment Note    Name: Erin Esparza Date: 03/05/2012 MRN: [id number] DOB: [birth year]   Current dose: 37.5 Gy  Current fraction: 15   MEDICATIONS: Current Outpatient Prescriptions  Medication Sig Dispense Refill  . acetaminophen (TYLENOL) 500 MG tablet Take 500 mg by mouth every 6 (six) hours as needed.      Marland Kitchen ADVAIR DISKUS 100-50 MCG/DOSE AEPB INHALE 1 PUFF TWICE A DAY AS DIRECTED  1 each  6  . aspirin 81 MG tablet Take 1 tablet (81 mg total) by mouth daily.  90 tablet  11  . enalapril (VASOTEC) 20 MG tablet Take 1 tablet (20 mg total) by mouth daily.  30 tablet  11  . furosemide (LASIX) 40 MG tablet Take 1 tablet (40 mg total) by mouth daily as needed.  30 tablet  11  . hydrochlorothiazide (HYDRODIURIL) 25 MG tablet Take 1 tablet (25 mg total) by mouth daily.  30 tablet  11  . non-metallic deodorant (ALRA) MISC Apply 1 application topically daily as needed.      . Rivaroxaban (XARELTO) 15 MG TABS tablet Take 1 tablet (15 mg total) by mouth 2 (two) times daily.  42 tablet  0  . Rivaroxaban 20 MG TABS Take 20 mg by mouth daily.  30 tablet  11  . Wound Cleansers (RADIAPLEX EX) Apply topically.       Current Facility-Administered Medications  Medication Dose Route Frequency Provider Last Rate Last Dose  . topical emolient (BIAFINE) emulsion   Topical PRN Jonna Coup, MD         ALLERGIES: Review of patient's allergies indicates no known allergies.   LABORATORY DATA:  Lab Results  Component Value Date   WBC 8.5 02/12/2012   HGB 12.0 02/12/2012   HCT 35.5 02/12/2012   MCV 92.8 02/12/2012   PLT 230 02/12/2012   Lab Results  Component Value Date   NA 139 02/12/2012   K 3.7 02/12/2012   CL 103 02/12/2012   CO2 26 02/12/2012   Lab Results  Component Value Date   ALT 10 02/12/2012   AST 14 02/12/2012   ALKPHOS 76 02/12/2012   BILITOT 0.30 02/12/2012     NARRATIVE: Erin Esparza  was seen today for weekly treatment management. The chart was checked and the patient's films were reviewed. The patient is doing fairly well. Some increased darkening of the skin with some irritation. Mild fatigue.  PHYSICAL EXAMINATION: vitals were not taken for this visit.     diffuse hyperpigmentation present. No desquamation currently. Overall her skin looks fairly good for where we are in her treatment.  ASSESSMENT: The patient is doing satisfactorily with treatment.  PLAN: We will continue with the patient's radiation treatment as planned. She was given Biafine and she will begin using this as well.

## 2012-03-05 NOTE — Assessment & Plan Note (Signed)
Patient receive Flu shot today.

## 2012-03-08 ENCOUNTER — Ambulatory Visit
Admission: RE | Admit: 2012-03-08 | Discharge: 2012-03-08 | Disposition: A | Discharge status: Home / Self Care | Payer: Medicare Other | Source: Ambulatory Visit | Attending: Radiation Oncology | Admitting: Radiation Oncology

## 2012-03-09 ENCOUNTER — Ambulatory Visit
Admission: RE | Admit: 2012-03-09 | Discharge: 2012-03-09 | Disposition: A | Discharge status: Home / Self Care | Payer: Medicare Other | Source: Ambulatory Visit | Attending: Radiation Oncology | Admitting: Radiation Oncology

## 2012-03-10 ENCOUNTER — Ambulatory Visit
Admission: RE | Admit: 2012-03-10 | Discharge: 2012-03-10 | Disposition: A | Discharge status: Home / Self Care | Payer: Medicare Other | Source: Ambulatory Visit | Attending: Radiation Oncology | Admitting: Radiation Oncology

## 2012-03-11 ENCOUNTER — Other Ambulatory Visit: Payer: Medicare Other

## 2012-03-11 ENCOUNTER — Ambulatory Visit
Admission: RE | Admit: 2012-03-11 | Discharge: 2012-03-11 | Disposition: A | Discharge status: Home / Self Care | Payer: Medicare Other | Source: Ambulatory Visit | Attending: Radiation Oncology | Admitting: Radiation Oncology

## 2012-03-12 ENCOUNTER — Ambulatory Visit
Admission: RE | Admit: 2012-03-12 | Discharge: 2012-03-12 | Disposition: A | Discharge status: Home / Self Care | Payer: Medicare Other | Source: Ambulatory Visit | Attending: Radiation Oncology | Admitting: Radiation Oncology

## 2012-03-12 VITALS — BP 127/57 | HR 87 | Temp 97.7°F | Wt 298.3 lb

## 2012-03-12 DIAGNOSIS — C50219 Malignant neoplasm of upper-inner quadrant of unspecified female breast: Secondary | ICD-10-CM

## 2012-03-12 NOTE — Progress Notes (Signed)
   Department of Radiation Oncology  Phone:  [phone] Fax:        [phone]  Weekly Treatment Note    Name: Erin Esparza Date: 03/12/2012 MRN: [id number] DOB: Feb 23, 1941   Current dose: 50 Gy  Current fraction: 20   MEDICATIONS: Current Outpatient Prescriptions  Medication Sig Dispense Refill  . acetaminophen (TYLENOL) 500 MG tablet Take 500 mg by mouth every 6 (six) hours as needed.      Marland Kitchen ADVAIR DISKUS 100-50 MCG/DOSE AEPB INHALE 1 PUFF TWICE A DAY AS DIRECTED  1 each  6  . aspirin 81 MG tablet Take 1 tablet (81 mg total) by mouth daily.  90 tablet  11  . enalapril (VASOTEC) 20 MG tablet Take 1 tablet (20 mg total) by mouth daily.  30 tablet  11  . furosemide (LASIX) 40 MG tablet Take 1 tablet (40 mg total) by mouth daily as needed.  30 tablet  11  . hydrochlorothiazide (HYDRODIURIL) 25 MG tablet Take 1 tablet (25 mg total) by mouth daily.  30 tablet  11  . non-metallic deodorant (ALRA) MISC Apply 1 application topically daily as needed.      . Rivaroxaban (XARELTO) 15 MG TABS tablet Take 1 tablet (15 mg total) by mouth 2 (two) times daily.  42 tablet  0  . Rivaroxaban 20 MG TABS Take 20 mg by mouth daily.  30 tablet  11  . Wound Cleansers (RADIAPLEX EX) Apply topically.         ALLERGIES: Review of patient's allergies indicates no known allergies.   LABORATORY DATA:  Lab Results  Component Value Date   WBC 8.5 02/12/2012   HGB 12.0 02/12/2012   HCT 35.5 02/12/2012   MCV 92.8 02/12/2012   PLT 230 02/12/2012   Lab Results  Component Value Date   NA 139 02/12/2012   K 3.7 02/12/2012   CL 103 02/12/2012   CO2 26 02/12/2012   Lab Results  Component Value Date   ALT 10 02/12/2012   AST 14 02/12/2012   ALKPHOS 76 02/12/2012   BILITOT 0.30 02/12/2012     NARRATIVE: [name] was seen today for weekly treatment management. The chart was checked and the patient's films were reviewed. The patient is doing well. She finished today. Some slight increase in skin  irritation and a little bit of itching. No major changes.  PHYSICAL EXAMINATION: weight is 298 lb 4.8 oz (135.308 kg). Her temperature is 97.7 F (36.5 C). Her blood pressure is 127/57 and her pulse is 87.      prominent hyperpigmentation present throughout the treatment area. A little bit of dry desquamation underneath the breast.  ASSESSMENT: The patient is doing satisfactorily with treatment.  PLAN: She will begin using some Neosporin underneath the breast. Followup in one month.

## 2012-03-12 NOTE — Progress Notes (Signed)
Patient here for routine weekly under treat for left breast radiation treatment.Completed 20 treatments.Has area of blistering of mammary fold. To continue application of radiaplex and neosporin to affected area.

## 2012-03-12 NOTE — Progress Notes (Signed)
  Radiation Oncology         [phone] ________________________________  Name: Erin Esparza MRN: [id number]  Date: 02/26/2012  DOB: 26-Jun-1940  Complex simulation note  The patient has undergone complex simulation for her upcoming boost treatment for her diagnosis of breast cancer. The patient has initially been planned to receive 42.5 gray. The patient will now receive a 7.5 gray boost to the seroma cavity which has been contoured. This will be accomplished using an en face electron field. Based on the depth of the target area, 18 MeV electrons will be used and this field has been normalized to the 94% isodose line. The patient's final total dose therefore will be 50 gray. A special port plan is requested for the boost treatment.   _______________________________  Radene Gunning, MD, PhD

## 2012-03-30 NOTE — Progress Notes (Signed)
  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 The patient then received a boost to the tumor cavity with an en face electron field for an additional 7.5 gray at 2.5 gray per fraction. The patient's final dose was 50 gray.  Narrative: The patient tolerated radiation treatment relatively well.   The patient experienced some hyperpigmentation as well as some dry desquamation at the end of treatment  Plan: The patient has completed radiation treatment. The patient will return to radiation oncology clinic for routine followup in one month. I advised the patient to call or return sooner if they have any questions or concerns related to their recovery or treatment. ________________________________  Radene Gunning, M.D., Ph.D.

## 2012-04-01 ENCOUNTER — Encounter: Payer: Medicare Other | Admitting: Genetic Counselor

## 2012-04-01 ENCOUNTER — Other Ambulatory Visit: Payer: Medicare Other | Admitting: Lab

## 2012-04-07 ENCOUNTER — Encounter: Payer: Self-pay | Admitting: *Deleted

## 2012-04-07 NOTE — Progress Notes (Signed)
Mailed after appt letter to pt. 

## 2012-04-16 ENCOUNTER — Other Ambulatory Visit: Payer: Self-pay | Admitting: Family Medicine

## 2012-04-20 ENCOUNTER — Encounter: Payer: Self-pay | Admitting: Radiation Oncology

## 2012-04-20 DIAGNOSIS — M25571 Pain in right ankle and joints of right foot: Secondary | ICD-10-CM | POA: Insufficient documentation

## 2012-04-20 DIAGNOSIS — I878 Other specified disorders of veins: Secondary | ICD-10-CM | POA: Insufficient documentation

## 2012-04-21 ENCOUNTER — Ambulatory Visit
Admission: RE | Admit: 2012-04-21 | Discharge: 2012-04-21 | Disposition: A | Discharge status: Home / Self Care | Payer: Medicare Other | Source: Ambulatory Visit | Attending: Radiation Oncology | Admitting: Radiation Oncology

## 2012-04-21 ENCOUNTER — Encounter: Payer: Self-pay | Admitting: Radiation Oncology

## 2012-04-21 VITALS — BP 153/77 | HR 78 | Temp 97.5°F | Resp 18 | Wt 303.1 lb

## 2012-04-21 DIAGNOSIS — C50219 Malignant neoplasm of upper-inner quadrant of unspecified female breast: Secondary | ICD-10-CM

## 2012-04-21 NOTE — Progress Notes (Signed)
  Radiation Oncology         [phone] ________________________________  Name: Erin Esparza MRN: [id number]  Date: 04/21/2012  DOB: 02-16-1941  Follow-Up Visit Note  CC: Everlene Other, DO  Cornett, Clovis Pu., MD  Diagnosis:   Invasive ductal carcinoma of the left breast  Interval Since Last Radiation:  One month   Narrative:  The patient returns today for routine follow-up.  She finished treatment on 03/12/2012. The patient states that she has done well. A little bit of remaining fatigue but this has been improving. She feels that her skin has done very well. The itching has resolved.                               ALLERGIES:   has no known allergies.  Meds: Current Outpatient Prescriptions  Medication Sig Dispense Refill  . ADVAIR DISKUS 100-50 MCG/DOSE AEPB INHALE 1 PUFF TWICE A DAY AS DIRECTED  1 each  6  . enalapril (VASOTEC) 20 MG tablet TAKE 1 TABLET (20 MG TOTAL) BY MOUTH DAILY.  30 tablet  4  . hydrochlorothiazide (HYDRODIURIL) 25 MG tablet TAKE 1 TABLET (25 MG TOTAL) BY MOUTH DAILY.  30 tablet  4  . Rivaroxaban 20 MG TABS Take 20 mg by mouth daily.  30 tablet  11  . acetaminophen (TYLENOL) 500 MG tablet Take 500 mg by mouth every 6 (six) hours as needed.      . furosemide (LASIX) 40 MG tablet Take 1 tablet (40 mg total) by mouth daily as needed.  30 tablet  11  . Rivaroxaban (XARELTO) 15 MG TABS tablet Take 1 tablet (15 mg total) by mouth 2 (two) times daily.  42 tablet  0  . Wound Cleansers (RADIAPLEX EX) Apply topically.        Physical Findings: The patient is in no acute distress. Patient is alert and oriented.  weight is 303 lb 1.6 oz (137.485 kg). Her oral temperature is 97.5 F (36.4 C). Her blood pressure is 153/77 and her pulse is 78. Her respiration is 18 and oxygen saturation is 98%. .   The patient's scan in the treatment area does show some mild to moderate hyperpigmentation. Overall this looks very good with no ongoing desquamation.  Lab Findings: Lab  Results  Component Value Date   WBC 8.5 02/12/2012   HGB 12.0 02/12/2012   HCT 35.5 02/12/2012   MCV 92.8 02/12/2012   PLT 230 02/12/2012     Radiographic Findings: No results found.  Impression:    The patient has done well over the last month. She is still using  deodorant which we gave her and we instructed her on the fact that she could begin using over-the-counter deodorant as well whenever she likes. She also does not have any restricted activities from our perspective. No ongoing skin irritation.  Plan:  She will followup in our clinic on a when necessary basis.   Radene Gunning, M.D., Ph.D.

## 2012-04-21 NOTE — Progress Notes (Signed)
Patient presents to the clinic today accompanied by her daughter for a one month follow up appointment with Dr. Mitzi Hansen. Patient alert and oriented to person, place, and time. No distress noted. Steady gait noted. Pleasant affect noted. Patient denies pain at this time. Hyperpigmentation without desquamation of left breast noted. Patient denies applying lotion to this area. Encouraged patient to begin using OTC lotion with vitamin e or coco butter to aid in fading tanning associated with radiation therapy. Patient reports that she continues to use ALRA deodorant provided by this clinic. Instructed patient she could continue to alra until it was gone then transition to OTC deodorant. Patient reports that itch of left breast has resolved. Patient reports that her energy level is gradually improving. Patient denies nausea, vomiting, headache, dizziness, or diarrhea. Reported all findings to Dr. Mitzi Hansen.

## 2012-04-23 ENCOUNTER — Telehealth: Payer: Self-pay | Admitting: *Deleted

## 2012-04-23 NOTE — Telephone Encounter (Signed)
patient confirmed over the phone the new date and time on 05-24-2012 starting at 8:00am

## 2012-05-14 ENCOUNTER — Other Ambulatory Visit: Payer: Medicare Other | Admitting: Lab

## 2012-05-14 ENCOUNTER — Ambulatory Visit: Payer: Medicare Other | Admitting: Oncology

## 2012-05-17 ENCOUNTER — Encounter: Payer: Self-pay | Admitting: *Deleted

## 2012-05-17 ENCOUNTER — Encounter (INDEPENDENT_AMBULATORY_CARE_PROVIDER_SITE_OTHER): Payer: Self-pay | Admitting: Surgery

## 2012-05-17 ENCOUNTER — Ambulatory Visit (INDEPENDENT_AMBULATORY_CARE_PROVIDER_SITE_OTHER): Payer: Medicare Other | Admitting: Surgery

## 2012-05-17 VITALS — BP 162/70 | HR 76 | Temp 97.2°F | Resp 20 | Ht 69.0 in | Wt 300.0 lb

## 2012-05-17 DIAGNOSIS — Z853 Personal history of malignant neoplasm of breast: Secondary | ICD-10-CM

## 2012-05-17 NOTE — Patient Instructions (Signed)
Return 6 months

## 2012-05-17 NOTE — Progress Notes (Signed)
NAME: Erin Esparza       DOB: November 04, 1940           DATE: 05/17/2012       MRN: [id number]   Erin Esparza is a 71 y.o.Marland Kitchenfemale . She has no problems or concerns on either side. Erin Esparza is a 71 y.o. female. From [facility] who presents with a new diagnosis of DCIS with microinvasive disease. This patient has undergone annual screening mammography. Screening mammogram performed 10/23/2011 showed an abnormality in the left breast. A diagnostic mammogram performed 10/29/2011 showed a suspicious mass at 10:00 7 cm the nipple. Ultrasound showed a hypoechoic mass measuring 7 x 6 x 5 mm. Biopsy was performed which showed benign parenchyma. The patient underwent a needle localized biopsy which was performed on 10/06/2011.  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 There was associated microinvasive disease. The vast majority the lesion was in fact DCIS with a tiny amount of invasion seen as a cellular level. Patient has been seen by radiation therapy and had her first radiation treatment today.     PFSH: She has had no significant changes since the last visit here.  ROS: There have been no significant changes since the last visit here  EXAM:  VS: BP 162/70  Pulse 76  Temp 97.2 F (36.2 C) (Temporal)  Resp 20  Ht 5\' 9"  (1.753 m)  Wt 300 lb (136.079 kg)  BMI 44.30 kg/m2  General: The patient is alert, oriented, generally healthy appearing, NAD. Mood and affect are normal.  Breasts:  post surgical changes noted on left   No mass bilateral  Lymphatics: She has no axillary or supraclavicular adenopathy on either side.  Extremities: Full ROM of the surgical side with no lymphedema noted.    Impression: Doing well, with no evidence of recurrent cancer or new cancer  Plan: Will continue to follow up on an biannual basis here.

## 2012-05-21 ENCOUNTER — Other Ambulatory Visit: Payer: Self-pay | Admitting: *Deleted

## 2012-05-21 DIAGNOSIS — C50219 Malignant neoplasm of upper-inner quadrant of unspecified female breast: Secondary | ICD-10-CM

## 2012-05-24 ENCOUNTER — Ambulatory Visit (HOSPITAL_BASED_OUTPATIENT_CLINIC_OR_DEPARTMENT_OTHER): Payer: Medicare Other | Admitting: Oncology

## 2012-05-24 ENCOUNTER — Other Ambulatory Visit (HOSPITAL_BASED_OUTPATIENT_CLINIC_OR_DEPARTMENT_OTHER): Payer: Medicare Other | Admitting: Lab

## 2012-05-24 ENCOUNTER — Telehealth: Payer: Self-pay | Admitting: *Deleted

## 2012-05-24 DIAGNOSIS — C50219 Malignant neoplasm of upper-inner quadrant of unspecified female breast: Secondary | ICD-10-CM

## 2012-05-24 DIAGNOSIS — Z170 Estrogen receptor positive status [ER+]: Secondary | ICD-10-CM

## 2012-05-24 LAB — COMPREHENSIVE METABOLIC PANEL (CC13)
ALT: 11 U/L (ref 0–55)
AST: 13 U/L (ref 5–34)
Albumin: 3.2 g/dL — ABNORMAL LOW (ref 3.5–5.0)
Alkaline Phosphatase: 76 U/L (ref 40–150)
BUN: 14 mg/dL (ref 7.0–26.0)
CO2: 27 mEq/L (ref 22–29)
Calcium: 9 mg/dL (ref 8.4–10.4)
Chloride: 103 mEq/L (ref 98–107)
Creatinine: 1 mg/dL (ref 0.6–1.1)
Glucose: 111 mg/dl — ABNORMAL HIGH (ref 70–99)
Potassium: 4 mEq/L (ref 3.5–5.1)
Sodium: 139 mEq/L (ref 136–145)
Total Bilirubin: 0.4 mg/dL (ref 0.20–1.20)
Total Protein: 7.2 g/dL (ref 6.4–8.3)

## 2012-05-24 LAB — CBC WITH DIFFERENTIAL/PLATELET
BASO%: 0.6 % (ref 0.0–2.0)
Basophils Absolute: 0 10*3/uL (ref 0.0–0.1)
EOS%: 4.2 % (ref 0.0–7.0)
Eosinophils Absolute: 0.3 10*3/uL (ref 0.0–0.5)
HCT: 34.4 % — ABNORMAL LOW (ref 34.8–46.6)
HGB: 11.5 g/dL — ABNORMAL LOW (ref 11.6–15.9)
LYMPH%: 16.9 % (ref 14.0–49.7)
MCH: 31.3 pg (ref 25.1–34.0)
MCHC: 33.5 g/dL (ref 31.5–36.0)
MCV: 93.2 fL (ref 79.5–101.0)
MONO#: 0.7 10*3/uL (ref 0.1–0.9)
MONO%: 9.5 % (ref 0.0–14.0)
NEUT#: 5.1 10*3/uL (ref 1.5–6.5)
NEUT%: 68.8 % (ref 38.4–76.8)
Platelets: 236 10*3/uL (ref 145–400)
RBC: 3.68 10*6/uL — ABNORMAL LOW (ref 3.70–5.45)
RDW: 13.6 % (ref 11.2–14.5)
WBC: 7.5 10*3/uL (ref 3.9–10.3)
lymph#: 1.3 10*3/uL (ref 0.9–3.3)

## 2012-05-24 LAB — LACTATE DEHYDROGENASE (CC13): LDH: 211 U/L (ref 125–245)

## 2012-05-24 LAB — CANCER ANTIGEN 27.29: CA 27.29: 20 U/mL (ref 0–39)

## 2012-05-24 NOTE — Telephone Encounter (Signed)
Gave patient instructions to get her 2014

## 2012-05-26 NOTE — Progress Notes (Signed)
ID: [name]  DOB: 08/31/1940  MR#: [id number]  CSN#: [id number] Cc. 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  ROS:  Patient is here with daughter , she did not undergo genetic testing, nor have a bone density test. She tolerated radiation well with minimal skin toxicity  No Known Allergies  Current Outpatient Prescriptions  Medication Sig Dispense Refill  . acetaminophen (TYLENOL) 500 MG tablet Take 500 mg by mouth every 6 (six) hours as needed.      Marland Kitchen ADVAIR DISKUS 100-50 MCG/DOSE AEPB INHALE 1 PUFF TWICE A DAY AS DIRECTED  1 each  6  . enalapril (VASOTEC) 20 MG tablet TAKE 1 TABLET (20 MG TOTAL) BY MOUTH DAILY.  30 tablet  4  . hydrochlorothiazide (HYDRODIURIL) 25 MG tablet TAKE 1 TABLET (25 MG TOTAL) BY MOUTH DAILY.  30 tablet  4  . Rivaroxaban 20 MG TABS Take 20 mg by mouth daily.  30 tablet  11  . Wound Cleansers (RADIAPLEX EX) Apply topically.         Objective:  There were no vitals filed for this visit.  BMI: There is no height or weight on file to calculate BMI.   ECOG PS 0  Physical Exam:   Sclerae unicteric  Oropharynx clear  No peripheral adenopathy  Lungs clear -- no rales or rhonchi  Heart regular rate and rhythm  Abdomen benign  MSK no focal spinal tenderness, no peripheral edema  Neuro nonfocal  Breast exam: rt breast -wnl, lt breast - pigmentation changes, no masses appreciated.  Lab Results:      Chemistry      Component Value Date/Time   NA 139 05/24/2012 0827   NA 138 01/02/2012 1030   K 4.0 05/24/2012 0827   K 3.9 01/02/2012 1030   CL 103 05/24/2012 0827   CL 100 01/02/2012 1030   CO2 27 05/24/2012 0827   CO2 28 01/02/2012 1030   BUN 14.0 05/24/2012 0827   BUN 15 01/02/2012 1030   CREATININE 1.0 05/24/2012 0827   CREATININE 0.75 01/02/2012 1030       Component Value Date/Time   CALCIUM 9.0 05/24/2012 0827   CALCIUM 9.4 01/02/2012 1030   ALKPHOS 76 05/24/2012 0827   ALKPHOS 77 01/02/2012 1030   AST 13 05/24/2012 0827   AST 13 01/02/2012 1030   ALT 11 05/24/2012 0827   ALT 8 01/02/2012 1030   BILITOT 0.40 05/24/2012 0827   BILITOT 0.3 01/02/2012 1030       Lab Results  Component Value Date   WBC 7.5 05/24/2012   HGB 11.5* 05/24/2012   HCT 34.4* 05/24/2012   MCV 93.2 05/24/2012   PLT 236 05/24/2012   NEUTROABS 5.1 05/24/2012    Studies/Results:  No results found.   Assessment: 71 yo with hx of DCIS and microinvasive breast cancer   S/p lumpectomy and xrt.    Plan: We discussed the role of antihormonal therapy. She is reluctant to [facility] genetic testing, but will have a bone density test. I discussed the use of arimidex and its potential side effects . I have encouraged to take additional vitamin D, as well as weight loss.  Marland Kitchen  She will be seen in 3 months.   RUBIN,PETER 05/26/2012

## 2012-06-01 ENCOUNTER — Other Ambulatory Visit: Payer: Self-pay | Admitting: Lab

## 2012-06-11 ENCOUNTER — Ambulatory Visit
Admission: RE | Admit: 2012-06-11 | Discharge: 2012-06-11 | Disposition: A | Discharge status: Home / Self Care | Payer: Medicare Other | Source: Ambulatory Visit | Attending: Oncology | Admitting: Oncology

## 2012-06-11 DIAGNOSIS — C50219 Malignant neoplasm of upper-inner quadrant of unspecified female breast: Secondary | ICD-10-CM

## 2012-06-18 ENCOUNTER — Other Ambulatory Visit: Payer: Self-pay | Admitting: Family Medicine

## 2012-06-18 MED ORDER — FUROSEMIDE 40 MG PO TABS: 40.0000 mg | ORAL_TABLET | Freq: Every day | ORAL | Status: DC | PRN

## 2012-06-25 ENCOUNTER — Encounter: Payer: Self-pay | Admitting: Oncology

## 2012-06-25 ENCOUNTER — Telehealth: Payer: Self-pay | Admitting: *Deleted

## 2012-06-25 NOTE — Telephone Encounter (Signed)
DUPLICATE

## 2012-06-25 NOTE — Telephone Encounter (Signed)
Pt called about appt and needs to be seen for medication.  Confirmed 06/30/12 appt w/ pt.  Mailed letter & calendar to pt.

## 2012-06-30 ENCOUNTER — Ambulatory Visit: Payer: Medicare Other | Admitting: Gynecologic Oncology

## 2012-07-05 ENCOUNTER — Encounter: Payer: Self-pay | Admitting: Gynecologic Oncology

## 2012-07-05 ENCOUNTER — Telehealth: Payer: Self-pay | Admitting: Oncology

## 2012-07-05 ENCOUNTER — Ambulatory Visit (HOSPITAL_BASED_OUTPATIENT_CLINIC_OR_DEPARTMENT_OTHER): Payer: Medicare Other | Admitting: Gynecologic Oncology

## 2012-07-05 VITALS — BP 144/74 | HR 90 | Temp 98.6°F | Resp 20 | Ht 69.0 in | Wt 303.1 lb

## 2012-07-05 DIAGNOSIS — C50919 Malignant neoplasm of unspecified site of unspecified female breast: Secondary | ICD-10-CM

## 2012-07-05 DIAGNOSIS — Z170 Estrogen receptor positive status [ER+]: Secondary | ICD-10-CM

## 2012-07-05 DIAGNOSIS — C50219 Malignant neoplasm of upper-inner quadrant of unspecified female breast: Secondary | ICD-10-CM

## 2012-07-05 MED ORDER — ANASTROZOLE 1 MG PO TABS: 1.0000 mg | ORAL_TABLET | Freq: Every day | ORAL | Status: AC

## 2012-07-05 NOTE — Telephone Encounter (Signed)
gv pt appt schedule for May. °

## 2012-07-05 NOTE — Progress Notes (Signed)
OFFICE PROGRESS NOTE  CC Adriana Simas [employer] [place], DO [address] [place] [address]  DIAGNOSIS: MOLLEIGH HUOT is a 72 year old [place] woman who was initially found to have a suspicious abnormality within the left breast on screening mammogram on 10/23/2011.  She then underwent a diagnostic mammogram on 10/29/2011, which revealed a suspicious mass at the 10:00 position, 7 cm from the left nipple.  An ultrasound was performed and showed an irregular hypoechoic mass measuring 7 x 6 x 5 mm.  A biopsy was then performed resulting a benign atrophic breast parenchyma with no evidence of malignancy.  These findings were felt to be discordant and the patient therefore proceeded with a needle localized excisional biopsy by Dr. Luisa Hart on 01/06/2012.  Pathology revealed a microscopic foci of invasive ductal carcinoma in the setting of DCIS, which was grade 2. No lymphovascular space invasion was seen and margins were negative. Receptor studies have indicated that the tumor is ER positive, PR positive, and HER-2/neu negative. The proliferative marker was 5%. No lymph nodes were evaluated.   PRIOR THERAPY:  She completed radiation on 03/12/12 under Dr. Mitzi Hansen.  CURRENT THERAPY:  Returning today for initiation of anti-estrogen therapy.  INTERVAL HISTORY: PATRYCE DEPRIEST 72 y.o. female returns for continued follow up.  At her last visit with Dr. Donnie Coffin, initiating anti-estrogen therapy was discussed.  She had a bone density scan in 06/2012 with normal results.  She voices no complaints since her last visit.  She continues to take Xarelto for a blood clot diagnosed in 2012 and she reports that she is tolerating it well.  She reports that she is a member of [employer] but she had not been in [place].  She last saw Dr. Luisa Hart in 05/2012.      MEDICAL HISTORY: Past Medical History  Diagnosis Date  . Pulmonary embolism   . DVT (deep venous thrombosis)   . Hypertension   . Venous stasis of lower extremity    . Asthma   . Right ankle pain   . Insulin resistance   . Clotting disorder   . Breast mass in female   . Shortness of breath   . Wears glasses   . Wears dentures     upper  . Wears partial dentures     lower  . Arthritis   . Diastolic congestive heart failure   . Breast cancer 01/06/12    left breast invasive ductal ca,dcis,ER/PR=+,  . Radiation 02/12/12 -03/12/12    left breast, total 50gy    ALLERGIES:   has no known allergies.  MEDICATIONS:  Current Outpatient Prescriptions  Medication Sig Dispense Refill  . acetaminophen (TYLENOL) 500 MG tablet Take 500 mg by mouth every 6 (six) hours as needed.      Marland Kitchen ADVAIR DISKUS 100-50 MCG/DOSE AEPB INHALE 1 PUFF TWICE A DAY AS DIRECTED  1 each  6  . aspirin 81 MG tablet Take 81 mg by mouth daily.      . enalapril (VASOTEC) 20 MG tablet TAKE 1 TABLET (20 MG TOTAL) BY MOUTH DAILY.  30 tablet  4  . furosemide (LASIX) 40 MG tablet Take 1 tablet (40 mg total) by mouth daily as needed (For leg swelling.).  30 tablet  3  . hydrochlorothiazide (HYDRODIURIL) 25 MG tablet TAKE 1 TABLET (25 MG TOTAL) BY MOUTH DAILY.  30 tablet  4  . Rivaroxaban 20 MG TABS Take 20 mg by mouth daily.  30 tablet  11  . Wound  Cleansers (RADIAPLEX EX) Apply topically.        SURGICAL HISTORY:  Past Surgical History  Procedure Date  . No past surgeries   . Breast biopsy 11/03/11    left breast bx 10 o'clock=benign breast parenchyma  . Breast lumpectomy 01/06/12    Left breast/ Dr. Maisie Fus Cornett    REVIEW OF SYSTEMS:   Constitutional: Feels well.  Cardiovascular: No chest pain, shortness of breath, or edema.  Pulmonary: No cough or wheeze.  Gastrointestinal: No nausea, vomiting, or diarrhea. No bright red blood per rectum or change in bowel movement.  Genitourinary: No frequency, urgency, or dysuria. No vaginal bleeding or discharge.  Musculoskeletal: No myalgia or joint pain. Neurologic: No weakness, numbness, or change in gait.  Psychology: No depression,  anxiety, or insomnia  HEALTH MAINTENANCE: Mammogram: 10/2011. Colonoscopy:  Refused to have. Bone Density Scan:  06/11/12 with normal results Pap Smear: 5 to 6 years ago.  Reporting GYN informed that she would no longer need annual pap smears.  PHYSICAL EXAMINATION: Blood pressure 144/74, pulse 90, temperature 98.6 F (37 C), temperature source Oral, resp. rate 20, height 5\' 9"  (1.753 m), weight 303 lb 1.6 oz (137.485 kg). Body mass index is 44.76 kg/(m^2). ECOG PERFORMANCE STATUS: 0 - Asymptomatic  General: Well developed, well nourished female in no acute distress. Alert and oriented x 3.  Head/ Neck: Oropharynx clear.  Sclerae anicteric.  Supple without any enlargements.  Lymph node survey: No cervical, supraclavicular, or axillary adenopathy  Cardiovascular: Regular rate and rhythm. S1 and S2 normal.  Lungs: Clear to auscultation bilaterally. No wheezes/crackles/rhonchi noted.  Skin: No rashes or lesions present. Back: No CVA tenderness.  Abdomen: Abdomen soft, non-tender and obese. Active bowel sounds in all quadrants. No evidence of a fluid wave or abdominal masses.  Breasts:  Left partial mastectomy scar noted and well healed.  No abnormalities noted on inspection.  No masses, nodularity, erythema, or discharge noted bilaterally. Extremities: 1+ pitting edema noted in the LLE.  Mild edema noted in the right LE.  No bilateral cyanosis or clubbing.    LABORATORY DATA: Lab Results  Component Value Date   WBC 7.5 05/24/2012   HGB 11.5* 05/24/2012   HCT 34.4* 05/24/2012   MCV 93.2 05/24/2012   PLT 236 05/24/2012      Chemistry      Component Value Date/Time   NA 139 05/24/2012 0827   NA 138 01/02/2012 1030   K 4.0 05/24/2012 0827   K 3.9 01/02/2012 1030   CL 103 05/24/2012 0827   CL 100 01/02/2012 1030   CO2 27 05/24/2012 0827   CO2 28 01/02/2012 1030   BUN 14.0 05/24/2012 0827   BUN 15 01/02/2012 1030   CREATININE 1.0 05/24/2012 0827   CREATININE 0.75 01/02/2012 1030       Component Value Date/Time   CALCIUM 9.0 05/24/2012 0827   CALCIUM 9.4 01/02/2012 1030   ALKPHOS 76 05/24/2012 0827   ALKPHOS 77 01/02/2012 1030   AST 13 05/24/2012 0827   AST 13 01/02/2012 1030   ALT 11 05/24/2012 0827   ALT 8 01/02/2012 1030   BILITOT 0.40 05/24/2012 0827   BILITOT 0.3 01/02/2012 1030      RADIOGRAPHIC STUDIES:  Dg Bone Density  06/11/2012  *RADIOLOGY REPORT*  Clinical Data: Menopausal osteoporosis screening.  Note that the patient's weight is 305 pounds which exceeds the table limits as a hip and spine measurement could not be performed.  DUAL X-RAY ABSORPTIOMETRY (DXA) FOR BONE MINERAL  DENSITY  LEFT FOREARM (1/3 RADIUS)  Bone Mineral Density (BMD):                     0.836 g/cm2 Young Adult T Score:                                  2.4 Z Score:                            4.7  ASSESSMENT:  Patient's diagnostic category is NORMAL by WHO Criteria.  FRACTURE RISK: NOT INCREASED  FRAX: World Health Organization FRAX assessment of absolute fracture risk is not calculated for this patient because the patient has no hip or spine able to be evaluated .  Comparison: None  RECOMMENDATIONS:  Effective therapies are available in the form of bisphosphonates, selective estrogen receptor modulators, biologic agents, and hormone replacement therapy (for women).  All patients should ensure an adequate intake of dietary calcium (1200mg  daily) and vitamin D (800 IU daily) unless contraindicated.  All treatment decisions require clinical judgement and consideration of individual patient factors, including patient preferences, co-morbidities, previous drug use, risk factors not captured in the FRAX model (e.g., frailty, falls, vitamin D deficiency, increased bone turnover, interval significant decline in bone density) and possible under-or over-estimation of fracture risk by FRAX.  The National Osteoporosis Foundation recommends that FDA-approved medical therapies be considered in postmenopausal women and  mean age 13 or older with a:        1)     Hip or vertebral (clinical or morphometric) fracture.           2)    T-score of -2.5 or lower at the spine or hip. 3)    Ten-year fracture probability by FRAX of 3% or greater for hip fracture or 20% or greater for major osteoporotic fracture. FOLLOW-UP:  People with diagnosed cases of osteoporosis or at high risk for fracture should have regular bone mineral density tests.  For patients eligible for Medicare, routine testing is allowed once every 2 years.  The testing frequency can be increased to one year for patients who have rapidly progressing disease, those who are receiving or discontinuing medical therapy to restore bone mass, or have additional risk factors.  World Science writer [facility]) Criteria:  Normal: T scores from +1.0 to -1.0 Low Bone Mass (Osteopenia): T scores between -1.0 and -2.5 Osteoporosis: T scores -2.5 and below  Comparison to Reference Population:  T score is the key measure used in the diagnosis of osteoporosis and relative risk determination for fracture.  It provides a value for bone mass relative to the mean bone mass of a young adult reference population expressed in terms of standard deviation (SD).  Z score is the age-matched score showing the patient's values compared to a population matched for age, sex, and race.  This is also expressed in terms of standard deviation.  The patient may have values that compare favorably to the age-matched values and still be at increased risk for fracture.   Original Report Authenticated By: Elberta Fortis, M.D.    ASSESSMENT:  72 year old with a history of DCIS and microinvasive left breast cancer status post lumpectomy on 01/06/12.  Final path revealed strongly ER/PR positive microinvasive disease with DCIS.  She completed radiation on 03/12/12.  PLAN:  She is to begin Arimidex 1 mg PO  daily.  It is also recommended that she begin taking Caltrate with Vit D two tablets daily along with Vitamin D3  1000 IU daily.  Potential side effects of Arimidex discussed with the patient and Dr. Welton Flakes and handouts given.  She is encouraged to increase her physical activity.  She is to return in three months with lab work prior to her appointment or sooner if issues arise.   All questions were answered. The patient knows to call the clinic with any problems, questions or concerns. We can certainly see the patient much sooner if necessary.  I spent 30 minutes counseling the patient face to face and Dr. Welton Flakes spoke with the patient about beginning Arimidex and future follow up recommendations for 10 minutes. The total time spent in the appointment was 60 minutes.   Warner Mccreedy, NP Medical/Oncology [facility] [phone] (Office)  07/05/2012, 11:26 AM

## 2012-07-05 NOTE — Patient Instructions (Addendum)
Doing well.  Begin taking Arimidex 1 mg daily.  Being taking Caltrate with Vitamin D two tablets daily along with Vitamin D3 1000 IU daily.  Plan to return in 3 months with lab work prior to your visit.  Anastrozole tablets What is this medicine? ANASTROZOLE (an AS troe zole) is used to treat breast cancer in women who have gone through menopause. Some types of breast cancer depend on estrogen to grow, and this medicine can stop tumor growth by blocking estrogen production. This medicine may be used for other purposes; ask your health care provider or pharmacist if you have questions. What should I tell my health care provider before I take this medicine? They need to know if you have any of these conditions: -liver disease -an unusual or allergic reaction to anastrozole, other medicines, foods, dyes, or preservatives -pregnant or trying to get pregnant -breast-feeding How should I use this medicine? Take this medicine by mouth with a glass of water. Follow the directions on the prescription label. You can take this medicine with or without food. Take your doses at regular intervals. Do not take your medicine more often than directed. Do not stop taking except on the advice of your doctor or health care professional. Talk to your pediatrician regarding the use of this medicine in children. Special care may be needed. Overdosage: If you think you have taken too much of this medicine contact a poison control center or emergency room at once. NOTE: This medicine is only for you. Do not share this medicine with others. What if I miss a dose? If you miss a dose, take it as soon as you can. If it is almost time for your next dose, take only that dose. Do not take double or extra doses. What may interact with this medicine? Do not take this medicine with any of the following medications: -female hormones, like estrogens or progestins and birth control pills This medicine may also interact with the  following medications: -tamoxifen This list may not describe all possible interactions. Give your health care provider a list of all the medicines, herbs, non-prescription drugs, or dietary supplements you use. Also tell them if you smoke, drink alcohol, or use illegal drugs. Some items may interact with your medicine. What should I watch for while using this medicine? Visit your doctor or health care professional for regular checks on your progress. Let your doctor or health care professional know about any unusual vaginal bleeding. Do not treat yourself for diarrhea, nausea, vomiting or other side effects. Ask your doctor or health care professional for advice. What side effects may I notice from receiving this medicine? Side effects that you should report to your doctor or health care professional as soon as possible: -allergic reactions like skin rash, itching or hives, swelling of the face, lips, or tongue -any new or unusual symptoms -breathing problems -chest pain -leg pain or swelling -vomiting Side effects that usually do not require medical attention (report to your doctor or health care professional if they continue or are bothersome): -back or bone pain -cough, or throat infection -diarrhea or constipation -dizziness -headache -hot flashes -loss of appetite -nausea -sweating -weakness and tiredness -weight gain This list may not describe all possible side effects. Call your doctor for medical advice about side effects. You may report side effects to FDA at 1-800-FDA-1088. Where should I keep my medicine? Keep out of the reach of children. Store at room temperature between 20 and 25 degrees C (68 and  77 degrees F). Throw away any unused medicine after the expiration date. NOTE: This sheet is a summary. It may not cover all possible information. If you have questions about this medicine, talk to your doctor, pharmacist, or health care provider.  2013, Elsevier/Gold Standard.  (07/30/2007 4:31:52 PM)

## 2012-08-09 ENCOUNTER — Encounter: Payer: Self-pay | Admitting: Home Health Services

## 2012-08-09 ENCOUNTER — Telehealth: Payer: Self-pay | Admitting: Emergency Medicine

## 2012-08-09 NOTE — Telephone Encounter (Signed)
Patient called asking if she could take something for runny nose and watery eyes. Patient states she has talked to her pharmacy and per the pharmacist she is ok to take OTC allergy med.  Advised patient to call with any other questions or concerns.

## 2012-08-17 ENCOUNTER — Other Ambulatory Visit: Payer: Self-pay | Admitting: Family Medicine

## 2012-08-25 ENCOUNTER — Ambulatory Visit (INDEPENDENT_AMBULATORY_CARE_PROVIDER_SITE_OTHER): Payer: Medicare Other | Admitting: Family Medicine

## 2012-08-25 ENCOUNTER — Encounter: Payer: Self-pay | Admitting: Family Medicine

## 2012-08-25 VITALS — BP 154/64 | HR 84 | Temp 97.9°F | Wt 300.5 lb

## 2012-08-25 DIAGNOSIS — I10 Essential (primary) hypertension: Secondary | ICD-10-CM

## 2012-08-25 DIAGNOSIS — IMO0002 Reserved for concepts with insufficient information to code with codable children: Secondary | ICD-10-CM

## 2012-08-25 DIAGNOSIS — M1710 Unilateral primary osteoarthritis, unspecified knee: Secondary | ICD-10-CM

## 2012-08-25 DIAGNOSIS — I2699 Other pulmonary embolism without acute cor pulmonale: Secondary | ICD-10-CM

## 2012-08-25 DIAGNOSIS — C50919 Malignant neoplasm of unspecified site of unspecified female breast: Secondary | ICD-10-CM

## 2012-08-25 DIAGNOSIS — J45909 Unspecified asthma, uncomplicated: Secondary | ICD-10-CM

## 2012-08-25 MED ORDER — ACETAMINOPHEN 500 MG PO TABS: 500.0000 mg | ORAL_TABLET | Freq: Four times a day (QID) | ORAL | Status: AC | PRN

## 2012-08-25 MED ORDER — HYDROCHLOROTHIAZIDE 25 MG PO TABS: 25.0000 mg | ORAL_TABLET | Freq: Every day | ORAL | Status: DC

## 2012-08-25 MED ORDER — ENALAPRIL MALEATE 20 MG PO TABS: 20.0000 mg | ORAL_TABLET | Freq: Two times a day (BID) | ORAL | Status: DC

## 2012-08-25 MED ORDER — ALBUTEROL SULFATE HFA 108 (90 BASE) MCG/ACT IN AERS: 2.0000 | INHALATION_SPRAY | Freq: Four times a day (QID) | RESPIRATORY_TRACT | Status: DC | PRN

## 2012-08-25 NOTE — Assessment & Plan Note (Signed)
Followed by Heme/Onc.  Doing well on Anastrozole.

## 2012-08-25 NOTE — Assessment & Plan Note (Signed)
Well controlled. Rx sent in for new Albuterol MDI.

## 2012-08-25 NOTE — Patient Instructions (Addendum)
It was good seeing you today.  I have increased your Enalapril to 40 mg daily.  Continue your other medications as prescribed.  You can follow up with me in 6 months to 1 year or earlier if needed.

## 2012-08-25 NOTE — Assessment & Plan Note (Signed)
Rx sent in for Tylenol PRN. Well controlled at this time.

## 2012-08-25 NOTE — Progress Notes (Signed)
Subjective:     Patient ID: Erin Esparza, female   DOB: [birth year], 72 y.o.   MRN: [id number]  HPI Erin Esparza presents today for follow up.    1) HTN Home BP Monitoring - No Chest pain- No   Dyspnea- No Medications Compliance-  Patient compliant with medications. Lightheadedness-  No  Edema- No ROS - See HPI  2) Hx of DVT and Pulmonary embolism - Patient compliant with Xarelto - Denies SOB, blood in BM's or urine.  3) Asthma - Well controlled on Advair. - Patient no longer has albuterol at home as it expired.  4) Breast Cancer - Currently followed by Heme/Onc, Dr. Welton Flakes - Patient on Anastrazole and being followed Q3 months  5) DJD - Knee - Reports pain primarily early in the morning and after sitting for extended periods of time. - Improves throughout the day. - Mild pain.  Controlled with Tylenol.  Past Medical History  Diagnosis Date  . Pulmonary embolism   . DVT (deep venous thrombosis)   . Hypertension   . Venous stasis of lower extremity   . Asthma   . Right ankle pain   . Insulin resistance   . Clotting disorder   . Breast mass in female   . Shortness of breath   . Wears glasses   . Wears dentures     upper  . Wears partial dentures     lower  . Arthritis   . Diastolic congestive heart failure   . Breast cancer 01/06/12    left breast invasive ductal ca,dcis,ER/PR=+,  . Radiation 02/12/12 -03/12/12    left breast, total 50gy    Review of Systems See HPI    Objective:   Physical Exam  Filed Vitals:   08/25/12 1619  BP: 154/64  Pulse: 84  Temp: 97.9 F (36.6 C)   General: well appearing, NAD. Heart: RRR, no murmurs, rubs, or gallops. Lungs: CTAB. No rales, rhonchi, or wheeze. Abdomen: soft, nontender, nondistended.  Extremities: 1+ LE edema with chronic venous stasis changes noted. Skin: no rashes or lesions.    Assessment:         Plan:

## 2012-08-25 NOTE — Assessment & Plan Note (Signed)
BP not at goal. Enalapril increased to 40 mg daily.

## 2012-08-25 NOTE — Assessment & Plan Note (Signed)
Doing well on Xarelto. 

## 2012-09-17 ENCOUNTER — Other Ambulatory Visit: Payer: Self-pay | Admitting: Family Medicine

## 2012-09-21 ENCOUNTER — Other Ambulatory Visit: Payer: Self-pay | Admitting: Family Medicine

## 2012-09-21 DIAGNOSIS — N63 Unspecified lump in unspecified breast: Secondary | ICD-10-CM

## 2012-09-24 ENCOUNTER — Other Ambulatory Visit: Payer: Self-pay | Admitting: Family Medicine

## 2012-09-30 ENCOUNTER — Ambulatory Visit (INDEPENDENT_AMBULATORY_CARE_PROVIDER_SITE_OTHER): Payer: Medicare Other | Admitting: Family Medicine

## 2012-09-30 ENCOUNTER — Encounter: Payer: Self-pay | Admitting: Family Medicine

## 2012-09-30 VITALS — BP 142/82 | HR 85 | Ht 66.0 in | Wt 298.0 lb

## 2012-09-30 DIAGNOSIS — R202 Paresthesia of skin: Secondary | ICD-10-CM | POA: Insufficient documentation

## 2012-09-30 DIAGNOSIS — R209 Unspecified disturbances of skin sensation: Secondary | ICD-10-CM

## 2012-09-30 LAB — VITAMIN B12: Vitamin B-12: 410 pg/mL (ref 211–911)

## 2012-09-30 NOTE — Progress Notes (Signed)
Subjective:     Patient ID: Erin Esparza, female   DOB: [birth year], 72 y.o.   MRN: [id number]  Leg Pain  The incident occurred 12 to 24 hours ago (C/O right LL pins and needle sensation). The incident occurred at home. There was no injury mechanism. The pain is present in the right leg. Quality: pins and needle sensation. The patient is experiencing no pain. Associated symptoms include tingling. Pertinent negatives include no inability to bear weight, loss of motion, muscle weakness or numbness. Nothing (SHe was sitting when symptom happened,but at times she will have tingling when walking.) aggravates the symptoms. She has tried nothing for the symptoms.   Past Medical History  Diagnosis Date  . Pulmonary embolism   . DVT (deep venous thrombosis)   . Hypertension   . Venous stasis of lower extremity   . Asthma   . Right ankle pain   . Insulin resistance   . Clotting disorder   . Breast mass in female   . Shortness of breath   . Wears glasses   . Wears dentures     upper  . Wears partial dentures     lower  . Arthritis   . Diastolic congestive heart failure   . Breast cancer 01/06/12    left breast invasive ductal ca,dcis,ER/PR=+,  . Radiation 02/12/12 -03/12/12    left breast, total 50gy     Review of Systems  Constitutional: Negative.   Respiratory: Negative.   Cardiovascular: Negative.   Musculoskeletal:       Tingling on LL  Neurological: Positive for tingling. Negative for dizziness, tremors, weakness and numbness.  All other systems reviewed and are negative.   Filed Vitals:   09/30/12 1435  BP: 142/82  Pulse: 85  Height: 5\' 6"  (1.676 m)  Weight: 298 lb (135.172 kg)        Objective:   Physical Exam  Nursing note and vitals reviewed. Constitutional: She is oriented to person, place, and time. She appears well-developed. No distress.  Cardiovascular: Normal rate, regular rhythm, normal heart sounds and intact distal pulses.   No murmur heard. Pulmonary/Chest:  Effort normal and breath sounds normal. No respiratory distress. She has no wheezes.  Abdominal: Soft. Bowel sounds are normal. She exhibits no distension. There is no tenderness.  Musculoskeletal: Normal range of motion. She exhibits no edema.  Neurological: She is alert and oriented to person, place, and time. She has normal strength and normal reflexes. No cranial nerve deficit or sensory deficit.  No sensory deficit over both LL       Assessment:     Paresthesia: may be due to vitamin deficiency      Plan:     Vit B12 and D checked. MVI recommended. If B12 and D are low,will plan to replace it. RTC in 4 wk or sooner if symptom persist.

## 2012-09-30 NOTE — Assessment & Plan Note (Signed)
Paresthesia: may be due to vitamin deficiency      Vit B12 and D checked. MVI recommended. If B12 and D are low,will plan to replace it. RTC in 4 wk or sooner if symptom persist.

## 2012-09-30 NOTE — Patient Instructions (Signed)
Paresthesia Paresthesia is a burning or prickling feeling. This feeling can happen in any part of the body. It often happens in the hands, arms, legs, or feet. HOME CARE  Avoid drinking alcohol.  Try massage or needle therapy (acupuncture) to help with your problems.  Keep all doctor visits as told. GET HELP RIGHT AWAY IF:   You feel weak.  You have trouble walking or moving.  You have problems speaking or seeing.  You feel confused.  You cannot control when you poop (bowel movement) or pee (urinate).  You lose feeling (numbness) after an injury.  You pass out (faint).  Your burning or prickling feeling gets worse when you walk.  You have pain, cramps, or feel dizzy.  You have a rash. MAKE SURE YOU:   Understand these instructions.  Will watch your condition.  Will get help right away if you are not doing well or get worse. Document Released: 05/01/2008 Document Revised: 08/11/2011 Document Reviewed: 02/07/2011 Exit[employer] Patient Information 2013 [employer].  

## 2012-10-01 ENCOUNTER — Encounter: Payer: Self-pay | Admitting: Family Medicine

## 2012-10-01 LAB — VITAMIN D 25 HYDROXY (VIT D DEFICIENCY, FRACTURES): Vit D, 25-Hydroxy: 37 ng/mL (ref 30–89)

## 2012-10-04 ENCOUNTER — Encounter: Payer: Self-pay | Admitting: Oncology

## 2012-10-04 ENCOUNTER — Ambulatory Visit (HOSPITAL_BASED_OUTPATIENT_CLINIC_OR_DEPARTMENT_OTHER): Payer: Medicare Other | Admitting: Oncology

## 2012-10-04 ENCOUNTER — Ambulatory Visit (HOSPITAL_BASED_OUTPATIENT_CLINIC_OR_DEPARTMENT_OTHER): Payer: Medicare Other | Admitting: Lab

## 2012-10-04 ENCOUNTER — Other Ambulatory Visit: Payer: Medicare Other | Admitting: Lab

## 2012-10-04 ENCOUNTER — Telehealth: Payer: Self-pay | Admitting: *Deleted

## 2012-10-04 VITALS — BP 123/75 | HR 80 | Temp 98.0°F | Resp 20 | Ht 66.0 in | Wt 298.7 lb

## 2012-10-04 DIAGNOSIS — Z8672 Personal history of thrombophlebitis: Secondary | ICD-10-CM

## 2012-10-04 DIAGNOSIS — Z170 Estrogen receptor positive status [ER+]: Secondary | ICD-10-CM

## 2012-10-04 DIAGNOSIS — C50219 Malignant neoplasm of upper-inner quadrant of unspecified female breast: Secondary | ICD-10-CM

## 2012-10-04 DIAGNOSIS — C50912 Malignant neoplasm of unspecified site of left female breast: Secondary | ICD-10-CM

## 2012-10-04 LAB — COMPREHENSIVE METABOLIC PANEL (CC13)
ALT: 11 U/L (ref 0–55)
AST: 14 U/L (ref 5–34)
Albumin: 3.2 g/dL — ABNORMAL LOW (ref 3.5–5.0)
Alkaline Phosphatase: 79 U/L (ref 40–150)
BUN: 17.3 mg/dL (ref 7.0–26.0)
CO2: 28 mEq/L (ref 22–29)
Calcium: 10 mg/dL (ref 8.4–10.4)
Chloride: 102 mEq/L (ref 98–107)
Creatinine: 0.9 mg/dL (ref 0.6–1.1)
Glucose: 116 mg/dl — ABNORMAL HIGH (ref 70–99)
Potassium: 3.7 mEq/L (ref 3.5–5.1)
Sodium: 140 mEq/L (ref 136–145)
Total Bilirubin: 0.65 mg/dL (ref 0.20–1.20)
Total Protein: 7.8 g/dL (ref 6.4–8.3)

## 2012-10-04 LAB — CBC WITH DIFFERENTIAL/PLATELET
BASO%: 0.7 % (ref 0.0–2.0)
Basophils Absolute: 0.1 10*3/uL (ref 0.0–0.1)
EOS%: 3 % (ref 0.0–7.0)
Eosinophils Absolute: 0.3 10*3/uL (ref 0.0–0.5)
HCT: 34.6 % — ABNORMAL LOW (ref 34.8–46.6)
HGB: 11.4 g/dL — ABNORMAL LOW (ref 11.6–15.9)
LYMPH%: 12.9 % — ABNORMAL LOW (ref 14.0–49.7)
MCH: 29.7 pg (ref 25.1–34.0)
MCHC: 33.1 g/dL (ref 31.5–36.0)
MCV: 89.9 fL (ref 79.5–101.0)
MONO#: 0.7 10*3/uL (ref 0.1–0.9)
MONO%: 8.5 % (ref 0.0–14.0)
NEUT#: 6.4 10*3/uL (ref 1.5–6.5)
NEUT%: 74.9 % (ref 38.4–76.8)
Platelets: 247 10*3/uL (ref 145–400)
RBC: 3.85 10*6/uL (ref 3.70–5.45)
RDW: 13.6 % (ref 11.2–14.5)
WBC: 8.5 10*3/uL (ref 3.9–10.3)
lymph#: 1.1 10*3/uL (ref 0.9–3.3)

## 2012-10-04 NOTE — Progress Notes (Signed)
OFFICE PROGRESS NOTE  CC  Everlene Other, DO [address] [place] [address] Dr. 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 She then underwent a diagnostic mammogram on 10/29/2011, which revealed a suspicious mass at the 10:00 position, 7 cm from the left nipple. An ultrasound was performed and showed an irregular hypoechoic mass measuring 7 x 6 x 5 mm.  #2 A biopsy was then performed resulting a benign atrophic breast parenchyma with no evidence of malignancy. These findings were felt to be discordant and the patient therefore proceeded with a needle localized excisional biopsy by Dr. Luisa Hart on 01/06/2012. Pathology revealed a microscopic foci of invasive ductal carcinoma in the setting of DCIS, which was grade 2. No lymphovascular space invasion was seen and margins were negative. Receptor studies have indicated that the tumor is ER positive, PR positive, and HER-2/neu negative. The proliferative marker was 5%. No lymph nodes were evaluated.   #3 patient went on to receive adjuvant radiation therapy which she completed on 03/12/2012.  #4 she was then begun on Arimidex 1 mg daily. Total of 5 years of therapy is planned.  #4 patient has a history of blood clots diagnosed in 2012 she is on xeralto daily since 2012. She had a DVT and pulmonary embolism   CURRENT THERAPY: Arimidex 1 mg daily since February 2014.  INTERVAL HISTORY: Erin Esparza 72 y.o. female returns for followup visit. Clinically she's doing well without any significant problem she does have some left knee arthritis but that has been ongoing. She denies any fevers chills night sweats headaches no chest pains or palpitations she does have some right ankle pain. Remainder of the 10 point review of systems is negative.  MEDICAL HISTORY: Past  Medical History  Diagnosis Date  . Pulmonary embolism   . DVT (deep venous thrombosis)   . Hypertension   . Venous stasis of lower extremity   . Asthma   . Right ankle pain   . Insulin resistance   . Clotting disorder   . Breast mass in female   . Shortness of breath   . Wears glasses   . Wears dentures     upper  . Wears partial dentures     lower  . Arthritis   . Diastolic congestive heart failure   . Breast cancer 01/06/12    left breast invasive ductal ca,dcis,ER/PR=+,  . Radiation 02/12/12 -03/12/12    left breast, total 50gy    ALLERGIES:  has No Known Allergies.  MEDICATIONS:  Current Outpatient Prescriptions  Medication Sig Dispense Refill  . acetaminophen (TYLENOL) 500 MG tablet Take 1 tablet (500 mg total) by mouth every 6 (six) hours as needed.  30 tablet  3  . ADVAIR DISKUS 100-50 MCG/DOSE AEPB INHALE 1 PUFF TWICE A DAY AS DIRECTED  60 each  6  . ADVAIR DISKUS 100-50 MCG/DOSE AEPB INHALE 1 PUFF TWICE A DAY AS DIRECTED  60 each  6  . albuterol (PROVENTIL HFA;VENTOLIN HFA) 108 (90 BASE) MCG/ACT inhaler Inhale 2 puffs into the lungs every 6 (six) hours as needed for wheezing or shortness of breath.  1 Inhaler  2  . anastrozole (ARIMIDEX) 1 MG tablet       . aspirin 81 MG tablet Take 81 mg by mouth daily.      . enalapril (VASOTEC) 20 MG tablet Take 1  tablet (20 mg total) by mouth 2 (two) times daily.  60 tablet  4  . furosemide (LASIX) 40 MG tablet Take 1 tablet (40 mg total) by mouth daily as needed (For leg swelling.).  30 tablet  3  . hydrochlorothiazide (HYDRODIURIL) 25 MG tablet Take 1 tablet (25 mg total) by mouth daily.  30 tablet  4  . Multiple Vitamin (MULTIVITAMIN) tablet Take 1 tablet by mouth daily.      Carlena Hurl 20 MG TABS TAKE 20 MG BY MOUTH DAILY.  30 tablet  11  . enalapril (VASOTEC) 20 MG tablet TAKE 1 TABLET (20 MG TOTAL) BY MOUTH DAILY.  30 tablet  4  . hydrochlorothiazide (HYDRODIURIL) 25 MG tablet TAKE 1 TABLET (25 MG TOTAL) BY MOUTH DAILY.  30  tablet  4   No current facility-administered medications for this visit.    SURGICAL HISTORY:  Past Surgical History  Procedure Laterality Date  . No past surgeries    . Breast biopsy  11/03/11    left breast bx 10 o'clock=benign breast parenchyma  . Breast lumpectomy  01/06/12    Left breast/ Dr. Maisie Fus Cornett    REVIEW OF SYSTEMS:  Pertinent items are noted in HPI.   HEALTH MAINTENANCE:  PHYSICAL EXAMINATION: Blood pressure 123/75, pulse 80, temperature 98 F (36.7 C), temperature source Oral, resp. rate 20, height 5\' 6"  (1.676 m), weight 298 lb 11.2 oz (135.489 kg). Body mass index is 48.23 kg/(m^2). ECOG PERFORMANCE STATUS: 1 - Symptomatic but completely ambulatory HEENT exam EOMI PERRLA sclerae anicteric no conjunctival pallor oral mucosa is moist neck is supple lungs are clear cardiovascular regular rhythm abdomen soft nontender no HSM extremities no edema neuro patient's alert oriented otherwise nonfocal. Breast exam right breast no masses nipple discharge left breast well-healed surgical scar no masses or nipple discharge.    LABORATORY DATA: Lab Results  Component Value Date   WBC 7.5 05/24/2012   HGB 11.5* 05/24/2012   HCT 34.4* 05/24/2012   MCV 93.2 05/24/2012   PLT 236 05/24/2012      Chemistry      Component Value Date/Time   NA 139 05/24/2012 0827   NA 138 01/02/2012 1030   K 4.0 05/24/2012 0827   K 3.9 01/02/2012 1030   CL 103 05/24/2012 0827   CL 100 01/02/2012 1030   CO2 27 05/24/2012 0827   CO2 28 01/02/2012 1030   BUN 14.0 05/24/2012 0827   BUN 15 01/02/2012 1030   CREATININE 1.0 05/24/2012 0827   CREATININE 0.75 01/02/2012 1030      Component Value Date/Time   CALCIUM 9.0 05/24/2012 0827   CALCIUM 9.4 01/02/2012 1030   ALKPHOS 76 05/24/2012 0827   ALKPHOS 77 01/02/2012 1030   AST 13 05/24/2012 0827   AST 13 01/02/2012 1030   ALT 11 05/24/2012 0827   ALT 8 01/02/2012 1030   BILITOT 0.40 05/24/2012 0827   BILITOT 0.3 01/02/2012 1030       RADIOGRAPHIC  STUDIES:  No results found.  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 The final pathology revealed a strongly ER positive PR positive microinvasive disease with DCIS. She is status post radiation therapy completed 03/12/2012. She was subsequently begun on Arimidex 1 mg daily in February 2014. Clinically she has no evidence of recurrent disease.  #2 patient has had her bone density scan performed in January. She is also on Caltrate with vitamin  D and vitamin D 3 1000 international units daily.   PLAN:   #1 continue Arimidex 1 mg daily.  #2 patient will be seen back in 6 months time for followup.   All questions were answered. The patient knows to call the clinic with any problems, questions or concerns. We can certainly see the patient much sooner if necessary.  I spent 25 minutes counseling the patient face to face. The total time spent in the appointment was 30 minutes.    Drue Second, MD Medical/Oncology [facility] [phone] (beeper) [phone] (Office)  10/04/2012, 11:35 AM

## 2012-10-04 NOTE — Patient Instructions (Addendum)
Ing well Continue arimidex daily  I will see you back in 6 months

## 2012-10-04 NOTE — Telephone Encounter (Signed)
appts made and printed...td 

## 2012-10-05 LAB — VITAMIN D 25 HYDROXY (VIT D DEFICIENCY, FRACTURES): Vit D, 25-Hydroxy: 34 ng/mL (ref 30–89)

## 2012-10-22 ENCOUNTER — Ambulatory Visit (INDEPENDENT_AMBULATORY_CARE_PROVIDER_SITE_OTHER): Payer: Medicare Other | Admitting: Surgery

## 2012-10-22 ENCOUNTER — Ambulatory Visit
Admission: RE | Admit: 2012-10-22 | Discharge: 2012-10-22 | Disposition: A | Discharge status: Home / Self Care | Payer: Medicare Other | Source: Ambulatory Visit | Attending: Family Medicine | Admitting: Family Medicine

## 2012-10-22 DIAGNOSIS — N63 Unspecified lump in unspecified breast: Secondary | ICD-10-CM

## 2012-11-12 ENCOUNTER — Encounter (INDEPENDENT_AMBULATORY_CARE_PROVIDER_SITE_OTHER): Payer: Self-pay | Admitting: Surgery

## 2012-11-12 ENCOUNTER — Ambulatory Visit (INDEPENDENT_AMBULATORY_CARE_PROVIDER_SITE_OTHER): Payer: Medicare Other | Admitting: Surgery

## 2012-11-12 VITALS — BP 122/68 | HR 82 | Temp 96.9°F | Ht 68.0 in | Wt 298.2 lb

## 2012-11-12 DIAGNOSIS — Z853 Personal history of malignant neoplasm of breast: Secondary | ICD-10-CM

## 2012-11-12 NOTE — Progress Notes (Signed)
NAME: Erin Esparza       DOB: 11-Oct-1940           DATE: 11/12/2012       MRN: [id number]   [name] is a 72 y.o.Marland Kitchenfemale . She has no problems or concerns on either side. [name] is a 72 y.o. female. From [facility] who presents with a new diagnosis of DCIS with microinvasive disease. This patient has undergone annual screening mammography. Screening mammogram performed 10/23/2011 showed an abnormality in the left breast. A diagnostic mammogram performed 10/29/2011 showed a suspicious mass at 10:00 7 cm the nipple. Ultrasound showed a hypoechoic mass measuring 7 x 6 x 5 mm. Biopsy was performed which showed benign parenchyma. The patient underwent a needle localized biopsy which was performed on 10/06/2011.  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 There was associated microinvasive disease. The vast majority the lesion was in fact DCIS with a tiny amount of invasion seen as a cellular level. Patient has been seen by radiation therapy and had her first radiation treatment today.  Clinical Data: History of left breast cancer, diagnosed in August  2013 following wire localization and excisional biopsy. Prior to  the, the patient had an image-guided left breast biopsy with  discordant results. Annual examination.  DIGITAL DIAGNOSTIC BILATERAL MAMMOGRAM WITH CAD  Comparison: With priors  Findings:  ACR Breast Density Category 2: There is a scattered fibroglandular  pattern.  There is a ribbon shaped biopsy clip in the medial left breast at  the site of prior benign biopsy. Lumpectomy changes are seen more  inferiorly in the medial left breast. No mass, suspicious  microcalcification, or nonsurgical distortion is identified in  either breast to suggest malignancy.  Mammographic images were processed with CAD.  IMPRESSION:  No evidence of malignancy in either breast. Lumpectomy changes on  the left.   RECOMMENDATION:  Bilateral diagnostic mammogram in 1 year.  I have discussed the findings and recommendations with the patient.  Results were also provided in writing at the conclusion of the  visit. If applicable, a reminder letter will be sent to the  patient regarding her next appointment.  BI-RADS CATEGORY 2: Benign finding(s).  Original Report Authenticated By: Britta Mccreedy, M.D    PFSH: She has had no significant changes since the last visit here.  ROS: There have been no significant changes since the last visit here  EXAM:  VS: BP 122/68  Pulse 82  Temp(Src) 96.9 F (36.1 C) (Temporal)  Ht 5\' 8"  (1.727 m)  Wt 298 lb 3.2 oz (135.263 kg)  BMI 45.35 kg/m2  SpO2 96%  General: The patient is alert, oriented, generally healthy appearing, NAD. Mood and affect are normal.  Breasts:  post surgical changes noted on left   No mass bilateral  Lymphatics: She has no axillary or supraclavicular adenopathy on either side.  Extremities: Full ROM of the surgical side with no lymphedema noted.    Impression: Doing well, with no evidence of recurrent cancer or new cancer  Plan: Will continue to follow up on an annual basis here.

## 2012-11-12 NOTE — Patient Instructions (Signed)
Return 1 year. 

## 2013-02-08 ENCOUNTER — Other Ambulatory Visit: Payer: Self-pay | Admitting: Family Medicine

## 2013-02-18 ENCOUNTER — Telehealth: Payer: Self-pay | Admitting: *Deleted

## 2013-02-18 ENCOUNTER — Ambulatory Visit (INDEPENDENT_AMBULATORY_CARE_PROVIDER_SITE_OTHER): Payer: Medicare Other | Admitting: Family Medicine

## 2013-02-18 ENCOUNTER — Encounter: Payer: Self-pay | Admitting: Family Medicine

## 2013-02-18 VITALS — BP 143/87 | HR 87 | Temp 97.7°F | Ht 68.0 in | Wt 300.0 lb

## 2013-02-18 DIAGNOSIS — R209 Unspecified disturbances of skin sensation: Secondary | ICD-10-CM

## 2013-02-18 DIAGNOSIS — Z23 Encounter for immunization: Secondary | ICD-10-CM

## 2013-02-18 DIAGNOSIS — R208 Other disturbances of skin sensation: Secondary | ICD-10-CM

## 2013-02-18 MED ORDER — ENALAPRIL MALEATE 20 MG PO TABS: ORAL_TABLET | ORAL | Status: DC

## 2013-02-18 NOTE — Progress Notes (Signed)
Subjective:     Patient ID: Erin Esparza, female   DOB: 1941-03-28, 72 y.o.   MRN: [id number]  HPI Erin Esparza presents to the clinic today for a same day appointment.  1) Scapular/Back pain; Concern for Shingles - Patient reports that yesterday she noticed a little discomfort in an area around her left scapula.  It felt like a burning sensation. - She did not note any rash or skin changes. - This burning sensation subsequent resolved and she current has no symptoms or complaints.  - Given symptoms, she is concerned about possibility of shingles.  Review of Systems No recent fever, chills, chest pain, SOB.  No rash.     Objective:   Physical Exam Filed Vitals:   02/18/13 0957  BP: 143/87  Pulse: 87  Temp: 97.7 F (36.5 C)  Exam: General: well appearing, NAD. Cardiovascular: RRR. No murmurs, rubs, or gallops. Respiratory: CTAB. No rales, rhonchi, or wheeze. Abdomen: obese, soft, nontender, nondistended. Skin: Warm, dry, intact.  No rash noted.  No skin changes noted.    Assessment:     See problem list    Plan:

## 2013-02-18 NOTE — Telephone Encounter (Signed)
Patient in for appointment today, forgot to ask MD to fill out application for handicap placard, form in MD box.

## 2013-02-18 NOTE — Assessment & Plan Note (Signed)
No evidence of Herpes Zoster.  Patient reassured.

## 2013-03-06 ENCOUNTER — Other Ambulatory Visit: Payer: Self-pay | Admitting: Family Medicine

## 2013-03-13 ENCOUNTER — Other Ambulatory Visit: Payer: Self-pay | Admitting: Family Medicine

## 2013-04-07 ENCOUNTER — Telehealth: Payer: Self-pay | Admitting: *Deleted

## 2013-04-07 ENCOUNTER — Ambulatory Visit (HOSPITAL_BASED_OUTPATIENT_CLINIC_OR_DEPARTMENT_OTHER): Payer: Medicare Other | Admitting: Oncology

## 2013-04-07 ENCOUNTER — Encounter: Payer: Self-pay | Admitting: Oncology

## 2013-04-07 ENCOUNTER — Telehealth: Payer: Self-pay | Admitting: Oncology

## 2013-04-07 VITALS — BP 144/71 | HR 80 | Temp 98.3°F | Resp 20 | Ht 68.0 in | Wt 297.5 lb

## 2013-04-07 DIAGNOSIS — Z170 Estrogen receptor positive status [ER+]: Secondary | ICD-10-CM

## 2013-04-07 DIAGNOSIS — Z86711 Personal history of pulmonary embolism: Secondary | ICD-10-CM

## 2013-04-07 DIAGNOSIS — Z8672 Personal history of thrombophlebitis: Secondary | ICD-10-CM

## 2013-04-07 DIAGNOSIS — Z86718 Personal history of other venous thrombosis and embolism: Secondary | ICD-10-CM

## 2013-04-07 DIAGNOSIS — C50219 Malignant neoplasm of upper-inner quadrant of unspecified female breast: Secondary | ICD-10-CM

## 2013-04-07 DIAGNOSIS — Z7901 Long term (current) use of anticoagulants: Secondary | ICD-10-CM

## 2013-04-07 DIAGNOSIS — C50912 Malignant neoplasm of unspecified site of left female breast: Secondary | ICD-10-CM

## 2013-04-07 NOTE — Progress Notes (Signed)
OFFICE PROGRESS NOTE  CC  Erin Other, DO [address] [place] [address] Dr. 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 She then underwent a diagnostic mammogram on 10/29/2011, which revealed a suspicious mass at the 10:00 position, 7 cm from the left nipple. An ultrasound was performed and showed an irregular hypoechoic mass measuring 7 x 6 x 5 mm.  #2 A biopsy was then performed resulting a benign atrophic breast parenchyma with no evidence of malignancy. These findings were felt to be discordant and the patient therefore proceeded with a needle localized excisional biopsy by Dr. Luisa Hart on 01/06/2012. Pathology revealed a microscopic foci of invasive ductal carcinoma in the setting of DCIS, which was grade 2. No lymphovascular space invasion was seen and margins were negative. Receptor studies have indicated that the tumor is ER positive, PR positive, and HER-2/neu negative. The proliferative marker was 5%. No lymph nodes were evaluated.   #3 patient went on to receive adjuvant radiation therapy which she completed on 03/12/2012.  #4 she was then begun on Arimidex 1 mg daily. Total of 5 years of therapy is planned.  #4 patient has a history of blood clots diagnosed in 2012 she is on xeralto daily since 2012. She had a DVT and pulmonary embolism   CURRENT THERAPY: Arimidex 1 mg daily since February 2014.  INTERVAL HISTORY: Erin Esparza 72 y.o. female returns for followup visit. Clinically she's doing well without any significant problem she does have some left knee arthritis but that has been ongoing. She denies any fevers chills night sweats headaches no chest pains or palpitations she does have some right ankle pain. Remainder of the 10 point review of systems is negative.  MEDICAL HISTORY: Past  Medical History  Diagnosis Date  . Pulmonary embolism   . DVT (deep venous thrombosis)   . Hypertension   . Venous stasis of lower extremity   . Asthma   . Right ankle pain   . Insulin resistance   . Clotting disorder   . Breast mass in female   . Shortness of breath   . Wears glasses   . Wears dentures     upper  . Wears partial dentures     lower  . Arthritis   . Diastolic congestive heart failure   . Breast cancer 01/06/12    left breast invasive ductal ca,dcis,ER/PR=+,  . Radiation 02/12/12 -03/12/12    left breast, total 50gy    ALLERGIES:  has No Known Allergies.  MEDICATIONS:  Current Outpatient Prescriptions  Medication Sig Dispense Refill  . acetaminophen (TYLENOL) 500 MG tablet Take 1 tablet (500 mg total) by mouth every 6 (six) hours as needed.  30 tablet  3  . ADVAIR DISKUS 100-50 MCG/DOSE AEPB INHALE 1 PUFF TWICE A DAY AS DIRECTED  60 each  6  . anastrozole (ARIMIDEX) 1 MG tablet       . aspirin 81 MG tablet Take 81 mg by mouth daily.      . Calcium Carbonate (CALTRATE 600 PO) Take by mouth.      . enalapril (VASOTEC) 20 MG tablet TAKE 1 TABLET (20 MG TOTAL) BY MOUTH 2 (TWO) TIMES DAILY.  60 tablet  4  . hydrochlorothiazide (HYDRODIURIL) 25 MG tablet Take 1 tablet (25 mg total) by mouth daily.  30 tablet  4  . Multiple Vitamin (  MULTIVITAMIN) tablet Take 1 tablet by mouth daily.      Carlena Hurl 20 MG TABS TAKE 20 MG BY MOUTH DAILY.  30 tablet  11  . albuterol (PROVENTIL HFA;VENTOLIN HFA) 108 (90 BASE) MCG/ACT inhaler Inhale 2 puffs into the lungs every 6 (six) hours as needed for wheezing or shortness of breath.  1 Inhaler  2  . furosemide (LASIX) 40 MG tablet Take 1 tablet (40 mg total) by mouth daily as needed (For leg swelling.).  30 tablet  3   No current facility-administered medications for this visit.    SURGICAL HISTORY:  Past Surgical History  Procedure Laterality Date  . No past surgeries    . Breast biopsy  11/03/11    left breast bx 10  o'clock=benign breast parenchyma  . Breast lumpectomy  01/06/12    Left breast/ Dr. Maisie Fus Cornett    REVIEW OF SYSTEMS:  Pertinent items are noted in HPI.   HEALTH MAINTENANCE:  PHYSICAL EXAMINATION: Blood pressure 144/71, pulse 80, temperature 98.3 F (36.8 C), temperature source Oral, resp. rate 20, height 5\' 8"  (1.727 m), weight 297 lb 8 oz (134.945 kg). Body mass index is 45.25 kg/(m^2). ECOG PERFORMANCE STATUS: 1 - Symptomatic but completely ambulatory HEENT exam EOMI PERRLA sclerae anicteric no conjunctival pallor oral mucosa is moist neck is supple lungs are clear cardiovascular regular rhythm abdomen soft nontender no HSM extremities no edema neuro patient's alert oriented otherwise nonfocal. Breast exam right breast no masses nipple discharge left breast well-healed surgical scar no masses or nipple discharge.    LABORATORY DATA: Lab Results  Component Value Date   WBC 8.5 10/04/2012   HGB 11.4* 10/04/2012   HCT 34.6* 10/04/2012   MCV 89.9 10/04/2012   PLT 247 10/04/2012      Chemistry      Component Value Date/Time   NA 140 10/04/2012 1159   NA 138 01/02/2012 1030   K 3.7 10/04/2012 1159   K 3.9 01/02/2012 1030   CL 102 10/04/2012 1159   CL 100 01/02/2012 1030   CO2 28 10/04/2012 1159   CO2 28 01/02/2012 1030   BUN 17.3 10/04/2012 1159   BUN 15 01/02/2012 1030   CREATININE 0.9 10/04/2012 1159   CREATININE 0.75 01/02/2012 1030      Component Value Date/Time   CALCIUM 10.0 10/04/2012 1159   CALCIUM 9.4 01/02/2012 1030   ALKPHOS 79 10/04/2012 1159   ALKPHOS 77 01/02/2012 1030   AST 14 10/04/2012 1159   AST 13 01/02/2012 1030   ALT 11 10/04/2012 1159   ALT 8 01/02/2012 1030   BILITOT 0.65 10/04/2012 1159   BILITOT 0.3 01/02/2012 1030       RADIOGRAPHIC STUDIES:  No results found.  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 The final pathology revealed a strongly ER positive PR positive microinvasive  disease with DCIS. She is status post radiation therapy completed 03/12/2012. She was subsequently begun on Arimidex 1 mg daily in February 2014. Clinically she has no evidence of recurrent disease.  #2 patient has had her bone density scan performed in January. She is also on Caltrate with vitamin D and vitamin D 3 1000 international units daily.   PLAN:   #1 continue Arimidex 1 mg daily.  #2 patient will be seen back in 8 months time for followup.   All questions were answered. The patient knows to call the clinic with any  problems, questions or concerns. We can certainly see the patient much sooner if necessary.  I spent 15 minutes counseling the patient face to face. The total time spent in the appointment was 20 minutes.    Drue Second, MD Medical/Oncology [facility] [phone] (beeper) [phone] (Office)  04/07/2013, 11:55 AM

## 2013-04-07 NOTE — Telephone Encounter (Signed)
, °

## 2013-04-07 NOTE — Telephone Encounter (Signed)
appts made and printed...td 

## 2013-04-08 ENCOUNTER — Telehealth: Payer: Self-pay | Admitting: Oncology

## 2013-04-08 NOTE — Telephone Encounter (Signed)
, °

## 2013-05-31 ENCOUNTER — Telehealth: Payer: Self-pay | Admitting: Family Medicine

## 2013-05-31 NOTE — Telephone Encounter (Signed)
Pt called and wanted to know what she should do about the arthritis in her thumb. Should she get a splint from the store or come in for a visit. Please call and advise what to do. jw

## 2013-06-07 NOTE — Telephone Encounter (Signed)
Patient should come in for a visit if she is having difficulty and would like to be seen.

## 2013-06-15 ENCOUNTER — Ambulatory Visit (INDEPENDENT_AMBULATORY_CARE_PROVIDER_SITE_OTHER): Payer: Medicare Other | Admitting: Family Medicine

## 2013-06-15 VITALS — BP 150/59 | HR 90 | Temp 98.4°F | Ht 68.0 in | Wt 294.0 lb

## 2013-06-15 DIAGNOSIS — M19049 Primary osteoarthritis, unspecified hand: Secondary | ICD-10-CM | POA: Insufficient documentation

## 2013-06-15 NOTE — Assessment & Plan Note (Signed)
Patient symptoms consistent with [facility] arthritis. Offered joint injection today but patient declined and elected for conservative treatment with Aspercreme and Tylenol. NSAIDs contraindicated due to bleeding risk as patient is on anticoagulation and aspirin. Advised patient to followup as needed regarding this.  She agreed and reported that if she fails to improve we will proceed with joint injection.

## 2013-06-15 NOTE — Progress Notes (Signed)
Subjective:     Patient ID: Erin Esparza, female   DOB: 06-15-1940, 73 y.o.   MRN: [id number]  HPI 73 year old female history of breast cancer, recurrent DVT/PE, hypertension who presents for evaluation of left thumb pain.  Patient has been having pain around the [facility] joint for approximately 2 weeks.  Pain is moderate in severity and has led to some weakness.  She has tried Tylenol and Voltaren Gel (which he received from a friend) with some improvement.  No recent fall or trauma.  No other complaints today.  Review of Systems Per HPI    Objective:   Physical Exam Filed Vitals:   06/15/13 1436  BP: 150/59  Pulse: 90  Temp: 98.4 F (36.9 C)   General: Well appearing female in no acute distress. MSK: Patient with pain around the [facility] joint.  No obvious swelling or effusion.  Decreased range of motion particularly in extension.  No overlying redness or warmth.    Assessment/Plan:  See problem list

## 2013-07-11 ENCOUNTER — Other Ambulatory Visit: Payer: Self-pay | Admitting: Family Medicine

## 2013-08-11 ENCOUNTER — Other Ambulatory Visit: Payer: Self-pay | Admitting: Family Medicine

## 2013-09-09 ENCOUNTER — Other Ambulatory Visit: Payer: Self-pay | Admitting: Family Medicine

## 2013-09-13 ENCOUNTER — Other Ambulatory Visit: Payer: Self-pay | Admitting: *Deleted

## 2013-09-13 DIAGNOSIS — C50919 Malignant neoplasm of unspecified site of unspecified female breast: Secondary | ICD-10-CM

## 2013-09-13 MED ORDER — ANASTROZOLE 1 MG PO TABS: 1.0000 mg | ORAL_TABLET | Freq: Every day | ORAL | Status: DC

## 2013-09-19 ENCOUNTER — Other Ambulatory Visit: Payer: Self-pay | Admitting: Oncology

## 2013-09-19 DIAGNOSIS — Z853 Personal history of malignant neoplasm of breast: Secondary | ICD-10-CM

## 2013-10-02 ENCOUNTER — Other Ambulatory Visit: Payer: Self-pay | Admitting: Family Medicine

## 2013-10-04 ENCOUNTER — Ambulatory Visit (INDEPENDENT_AMBULATORY_CARE_PROVIDER_SITE_OTHER): Payer: Medicare Other | Admitting: Home Health Services

## 2013-10-04 ENCOUNTER — Other Ambulatory Visit: Payer: Self-pay | Admitting: *Deleted

## 2013-10-04 ENCOUNTER — Encounter: Payer: Self-pay | Admitting: Home Health Services

## 2013-10-04 VITALS — BP 112/63 | HR 76 | Temp 97.5°F | Ht 68.0 in | Wt 292.0 lb

## 2013-10-04 DIAGNOSIS — Z0000 Encounter for general adult medical examination without abnormal findings: Secondary | ICD-10-CM | POA: Diagnosis not present

## 2013-10-04 MED ORDER — RIVAROXABAN 20 MG PO TABS: ORAL_TABLET | ORAL | Status: DC

## 2013-10-04 MED ORDER — HYDROCHLOROTHIAZIDE 25 MG PO TABS: ORAL_TABLET | ORAL | Status: DC

## 2013-10-04 MED ORDER — FLUTICASONE-SALMETEROL 100-50 MCG/DOSE IN AEPB: INHALATION_SPRAY | RESPIRATORY_TRACT | Status: DC

## 2013-10-04 MED ORDER — FUROSEMIDE 40 MG PO TABS: 40.0000 mg | ORAL_TABLET | Freq: Every day | ORAL | Status: DC | PRN

## 2013-10-04 NOTE — Telephone Encounter (Signed)
Patient requested below medications, also was requesting xerlto if that is needed to be taken still.

## 2013-10-04 NOTE — Telephone Encounter (Signed)
Yes it still needs to be taken.

## 2013-10-04 NOTE — Progress Notes (Signed)
Patient here for annual wellness visit, patient reports: Risk Factors/Conditions needing evaluation or treatment: Pt does not have any new risk factors that need evaluation. Home Safety: Pt lives by self in 1 story home.  Pt reports having smoke detectors and does not have adaptive equipment in bathroom. Other Information: Corrective lens: Pt wears daily corrective lens, has annual eye exams. Dentures: Pt has full denture on top, and partial on bottom. Memory: Pt denies any memory problems. Patient's Mini Mental Score (recorded in doc. flowsheet): 29 Med Adherence:  We discussed the importance of taking medications daily for HTN and cholesterol.  Pt reports 0 missed days in the past week. ADL/IADL:  Pt reports independence in all functions. BMI/Exercise: We discussed bmi and strategies for weight loss including portion sizes and increasing fruits and vegetables.  We also discussed continuing with her weekly exercise of water aerobics and yoga. Bladder:  Pt reports no problems with bladder control.   Balance/Gait: Pt reports 0 falls in the past year.  We discussed home safety and fall prevention.   Hearing/vision:  Pt reports no new problems with hearing or vision.   Annual Wellness Visit Requirements Recorded Today In  Medical, family, social history Past Medical, Family, Social History Section  Current providers Care team  Current medications Medications  Wt, BP, Ht, BMI Vital signs  Visual acuity (welcome visit) Progress note  Hearing screening Progress note  Tobacco, alcohol, illicit drug use History  ADL Nurse Assessment  Depression Screening Nurse Assessment  Cognitive impairment Nurse Assessment  Mini Mental Status Document Flowsheet  Fall Risk Fall/Depression  Home Safety Progress Note  End of Life Planning (welcome visit) Social Documentation  Medicare preventative services Progress Note  Risk factors/conditions needing evaluation/treatment Progress Note  Personalized  health advice Patient Instructions, goals, letter  Diet & Exercise Social Documentation  Emergency Contact Social Documentation  Seat Belts Social Documentation  Sun exposure/protection Social Documentation

## 2013-10-06 ENCOUNTER — Telehealth: Payer: Self-pay | Admitting: Oncology

## 2013-10-06 NOTE — Telephone Encounter (Signed)
pt cld to see when next appt/adv pt of date & time-pt stated no need to mail sch

## 2013-10-09 ENCOUNTER — Other Ambulatory Visit: Payer: Self-pay | Admitting: Family Medicine

## 2013-10-14 ENCOUNTER — Other Ambulatory Visit: Payer: Medicare Other | Admitting: Lab

## 2013-10-14 ENCOUNTER — Ambulatory Visit: Payer: Medicare Other | Admitting: Oncology

## 2013-10-19 ENCOUNTER — Other Ambulatory Visit: Payer: Self-pay | Admitting: Adult Health

## 2013-10-19 DIAGNOSIS — Z853 Personal history of malignant neoplasm of breast: Secondary | ICD-10-CM

## 2013-10-27 ENCOUNTER — Encounter (INDEPENDENT_AMBULATORY_CARE_PROVIDER_SITE_OTHER): Payer: Self-pay | Admitting: Surgery

## 2013-11-01 ENCOUNTER — Other Ambulatory Visit: Payer: Self-pay | Admitting: Family Medicine

## 2013-11-02 ENCOUNTER — Other Ambulatory Visit: Payer: Self-pay | Admitting: Family Medicine

## 2013-11-04 ENCOUNTER — Ambulatory Visit
Admission: RE | Admit: 2013-11-04 | Discharge: 2013-11-04 | Disposition: A | Discharge status: Home / Self Care | Payer: Medicare Other | Source: Ambulatory Visit | Attending: Adult Health | Admitting: Adult Health

## 2013-11-04 DIAGNOSIS — Z853 Personal history of malignant neoplasm of breast: Secondary | ICD-10-CM

## 2013-11-10 ENCOUNTER — Ambulatory Visit: Payer: Medicare Other | Admitting: Family Medicine

## 2013-11-21 ENCOUNTER — Telehealth: Payer: Self-pay | Admitting: *Deleted

## 2013-11-21 NOTE — Telephone Encounter (Signed)
Left message for pt to return my call so I can reschedule her appt.

## 2013-11-23 ENCOUNTER — Telehealth: Payer: Self-pay | Admitting: *Deleted

## 2013-11-23 NOTE — Telephone Encounter (Signed)
Called pt and she was aware of Dr. Humphrey Rolls.  Confirmed 01/24/14 appt for Dr. Lindi Adie w/ pt.

## 2013-11-24 ENCOUNTER — Other Ambulatory Visit: Payer: Medicare Other

## 2013-11-24 ENCOUNTER — Ambulatory Visit: Payer: Medicare Other | Admitting: Oncology

## 2013-12-04 ENCOUNTER — Other Ambulatory Visit: Payer: Self-pay | Admitting: Family Medicine

## 2013-12-05 ENCOUNTER — Ambulatory Visit (INDEPENDENT_AMBULATORY_CARE_PROVIDER_SITE_OTHER): Payer: Medicare Other | Admitting: Surgery

## 2013-12-05 ENCOUNTER — Encounter (INDEPENDENT_AMBULATORY_CARE_PROVIDER_SITE_OTHER): Payer: Self-pay | Admitting: Surgery

## 2013-12-05 VITALS — BP 122/72 | HR 76 | Ht 68.0 in | Wt 295.0 lb

## 2013-12-05 DIAGNOSIS — Z853 Personal history of malignant neoplasm of breast: Secondary | ICD-10-CM

## 2013-12-05 NOTE — Patient Instructions (Signed)
Return 1 year. 

## 2013-12-05 NOTE — Progress Notes (Signed)
NAME: Erin Esparza       DOB: 12/14/1940           DATE: 12/05/2013       MRN: [id number]   Erin Esparza is a 73 y.o.Marland Kitchenfemale . She has no problems or concerns on either side. Erin Esparza is a 73 y.o. female. From [facility] who presents with a new diagnosis of DCIS with microinvasive disease. This patient has undergone annual screening mammography. Screening mammogram performed 10/23/2011 showed an abnormality in the left breast. A diagnostic mammogram performed 10/29/2011 showed a suspicious mass at 10:00 7 cm the nipple. Ultrasound showed a hypoechoic mass measuring 7 x 6 x 5 mm. Biopsy was performed which showed benign parenchyma. The patient underwent a needle localized biopsy which was performed on 10/06/2011.  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 There was associated microinvasive disease. The vast majority the lesion was in fact DCIS with a tiny amount of invasion seen as a cellular level.  She returns for 1 year visit.  No complaints.   PFSH: She has had no significant changes since the last visit here.  ROS: There have been no significant changes since the last visit here  EXAM:  VS: BP 122/72  Pulse 76  Ht _0  (1.727 m)  Wt 295 lb (133.811 kg)  BMI 44.86 kg/m2  General: The patient is alert, oriented, generally healthy appearing, NAD. Mood and affect are normal.  Breasts:  post surgical changes noted on left   No mass bilateral  Lymphatics: She has no axillary or supraclavicular adenopathy on either side.  Extremities: Full ROM of the surgical side with no lymphedema noted.  DIGITAL DIAGNOSTIC BILATERAL MAMMOGRAM WITH CAD  Comparison: With priors  Findings:  ACR Breast Density Category 2: There is a scattered fibroglandular  pattern.  There is a ribbon shaped biopsy clip in the medial left breast at  the site of prior benign biopsy. Lumpectomy changes are seen more  inferiorly in  the medial left breast. No mass, suspicious  microcalcification, or nonsurgical distortion is identified in  either breast to suggest malignancy.  Mammographic images were processed with CAD.  IMPRESSION:  No evidence of malignancy in either breast. Lumpectomy changes on  the left.  RECOMMENDATION:  Bilateral diagnostic mammogram in 1 year.  I have discussed the findings and recommendations with the patient.  Results were also provided in writing at the conclusion of the  visit. If applicable, a reminder letter will be sent to the  patient regarding her next appointment.  BI-RADS CATEGORY 2: Benign finding(s).  Original Report Authenticated By: Curlene Dolphin, M.D     Impression: Doing well, with no evidence of recurrent cancer or new cancer  Plan: Will continue to follow up on an annual basis here.

## 2014-01-02 ENCOUNTER — Other Ambulatory Visit: Payer: Self-pay | Admitting: Family Medicine

## 2014-01-23 ENCOUNTER — Other Ambulatory Visit: Payer: Self-pay | Admitting: *Deleted

## 2014-01-23 DIAGNOSIS — C50219 Malignant neoplasm of upper-inner quadrant of unspecified female breast: Secondary | ICD-10-CM

## 2014-01-24 ENCOUNTER — Telehealth: Payer: Self-pay | Admitting: Hematology and Oncology

## 2014-01-24 ENCOUNTER — Encounter: Payer: Self-pay | Admitting: Hematology and Oncology

## 2014-01-24 ENCOUNTER — Other Ambulatory Visit (HOSPITAL_BASED_OUTPATIENT_CLINIC_OR_DEPARTMENT_OTHER): Payer: Medicare Other

## 2014-01-24 ENCOUNTER — Ambulatory Visit (HOSPITAL_BASED_OUTPATIENT_CLINIC_OR_DEPARTMENT_OTHER): Payer: Medicare Other | Admitting: Hematology and Oncology

## 2014-01-24 VITALS — BP 136/60 | HR 85 | Temp 98.3°F | Resp 18 | Ht 68.0 in | Wt 293.3 lb

## 2014-01-24 DIAGNOSIS — C50219 Malignant neoplasm of upper-inner quadrant of unspecified female breast: Secondary | ICD-10-CM

## 2014-01-24 DIAGNOSIS — Z170 Estrogen receptor positive status [ER+]: Secondary | ICD-10-CM

## 2014-01-24 DIAGNOSIS — C50212 Malignant neoplasm of upper-inner quadrant of left female breast: Secondary | ICD-10-CM

## 2014-01-24 LAB — CBC WITH DIFFERENTIAL/PLATELET
BASO%: 0.5 % (ref 0.0–2.0)
Basophils Absolute: 0 10*3/uL (ref 0.0–0.1)
EOS%: 3.3 % (ref 0.0–7.0)
Eosinophils Absolute: 0.2 10*3/uL (ref 0.0–0.5)
HCT: 35.4 % (ref 34.8–46.6)
HGB: 11.4 g/dL — ABNORMAL LOW (ref 11.6–15.9)
LYMPH%: 19 % (ref 14.0–49.7)
MCH: 29.6 pg (ref 25.1–34.0)
MCHC: 32.3 g/dL (ref 31.5–36.0)
MCV: 91.8 fL (ref 79.5–101.0)
MONO#: 0.6 10*3/uL (ref 0.1–0.9)
MONO%: 8.7 % (ref 0.0–14.0)
NEUT#: 4.6 10*3/uL (ref 1.5–6.5)
NEUT%: 68.5 % (ref 38.4–76.8)
Platelets: 263 10*3/uL (ref 145–400)
RBC: 3.86 10*6/uL (ref 3.70–5.45)
RDW: 13.8 % (ref 11.2–14.5)
WBC: 6.7 10*3/uL (ref 3.9–10.3)
lymph#: 1.3 10*3/uL (ref 0.9–3.3)

## 2014-01-24 LAB — COMPREHENSIVE METABOLIC PANEL (CC13)
ALT: 11 U/L (ref 0–55)
AST: 18 U/L (ref 5–34)
Albumin: 3.4 g/dL — ABNORMAL LOW (ref 3.5–5.0)
Alkaline Phosphatase: 87 U/L (ref 40–150)
Anion Gap: 7 mEq/L (ref 3–11)
BUN: 16.9 mg/dL (ref 7.0–26.0)
CO2: 28 mEq/L (ref 22–29)
Calcium: 10.2 mg/dL (ref 8.4–10.4)
Chloride: 105 mEq/L (ref 98–109)
Creatinine: 0.8 mg/dL (ref 0.6–1.1)
Glucose: 106 mg/dl (ref 70–140)
Potassium: 3.9 mEq/L (ref 3.5–5.1)
Sodium: 140 mEq/L (ref 136–145)
Total Bilirubin: 0.34 mg/dL (ref 0.20–1.20)
Total Protein: 7.9 g/dL (ref 6.4–8.3)

## 2014-01-24 NOTE — Progress Notes (Signed)
Patient Care Team: Coral Spikes, DO as PCP - General (Pediatrics) Deatra Robinson, MD (Oncology)  DIAGNOSIS: Cancer of upper-inner quadrant of female breast   Primary site: Breast (Left)   Staging method: AJCC 7th Edition   Pathologic: Stage IA (T58mc, N0, cM0) signed by VRulon Eisenmenger MD on 01/24/2014 11:08 AM   Summary: Stage IA (T130m, N0, cM0)   SUMMARY OF ONCOLOGIC HISTORY:   Cancer of upper-inner quadrant of female breast   10/23/2011 Mammogram Suspicious mass at 10:00 position 7 cm from the left nipple. Ultrasound irregular hypoechoic mass 7 x 6 x 5 mm   01/06/2012 Initial Biopsy Initial biopsy was benign . Dr. CoBrantley Stageerformed needle localization excisional biopsy which showed microinvasive focus of invasive ductal carcinoma in the setting of DCIS grade 2 ER + PR + HER-2 Neg Ki67:5%; T1 mic N0 M0 stage IA   01/20/2012 Initial Diagnosis Cancer of upper-inner quadrant of female breast    - 03/12/2012 Radiation Therapy Radiation therapy to lumpectomy site   04/25/2012 -  Anti-estrogen oral therapy Arimidex 1 mg by mouth daily. 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 BrHartzells a [age]ear old African American lady with above-mentioned history of left-sided breast cancer that was treated with lumpectomy and radiation therapy. She has been on the Arimidex starting November 2013. She is tolerating the treatment very well without any major problems or concerns. She denies any muscle aches or pains. Very occasionally she gets hot flashes. She has not seen a gynecologist recently. Does not have a vaginal bleeding. Does not have any problems with blood clots. Continues to have leg swelling which is chronic in nature.  REVIEW OF SYSTEMS:   Constitutional: Denies fevers, chills or abnormal weight loss Eyes: Denies blurriness of vision Ears, nose, mouth, throat, and face: Denies mucositis or sore  throat Respiratory: Denies cough, dyspnea or wheezes Cardiovascular: Denies palpitation, chest discomfort or lower extremity swelling Gastrointestinal:  Denies nausea, heartburn or change in bowel habits Skin: Denies abnormal skin rashes Lymphatics: Denies new lymphadenopathy or easy bruising Neurological:Denies numbness, tingling or new weaknesses Behavioral/Psych: Mood is stable, no new changes  Breast: denies any pain or lumps or nodules in either breasts All other systems were reviewed with the patient and are negative.  I have reviewed the past medical history, past surgical history, social history and family history with the patient and they are unchanged from previous note.  ALLERGIES:  has No Known Allergies.  MEDICATIONS:  Current Outpatient Prescriptions  Medication Sig Dispense Refill  . acetaminophen (TYLENOL) 500 MG tablet Take 1 tablet (500 mg total) by mouth every 6 (six) hours as needed.  30 tablet  3  . anastrozole (ARIMIDEX) 1 MG tablet Take 1 tablet (1 mg total) by mouth daily.  90 tablet  1  . Calcium Carbonate (CALTRATE 600 PO) Take by mouth.      . enalapril (VASOTEC) 20 MG tablet TAKE 1 TABLET BY MOUTH TWICE DAILY  60 tablet  6  . Fluticasone-Salmeterol (ADVAIR DISKUS) 100-50 MCG/DOSE AEPB TAKE 1 PUFF BY MOUTH TWICE DAILY AS DIRECTED  60 each  3  . furosemide (LASIX) 40 MG tablet Take 1 tablet (40 mg total) by mouth daily as needed (For leg swelling.).  30 tablet  3  . hydrochlorothiazide (HYDRODIURIL) 25 MG tablet TAKE 1 TABLET BY MOUTH DAILY  90 tablet  0  . Multiple  Vitamin (MULTIVITAMIN) tablet Take 1 tablet by mouth daily.      Alveda Reasons 20 MG TABS tablet TAKE 1 TABLET BY MOUTH EVERY DAY  30 tablet  0   No current facility-administered medications for this visit.    PHYSICAL EXAMINATION: ECOG PERFORMANCE STATUS: 1 - Symptomatic but completely ambulatory  Filed Vitals:   01/24/14 1112  BP: 136/60  Pulse: 85  Temp: 98.3 F (36.8 C)  Resp: 18   Filed  Weights   01/24/14 1112  Weight: 293 lb 4.8 oz (133.04 kg)    GENERAL:alert, no distress and comfortable SKIN: skin color, texture, turgor are normal, no rashes or significant lesions EYES: normal, Conjunctiva are pink and non-injected, sclera clear OROPHARYNX:no exudate, no erythema and lips, buccal mucosa, and tongue normal  NECK: supple, thyroid normal size, non-tender, without nodularity LYMPH:  no palpable lymphadenopathy in the cervical, axillary or inguinal LUNGS: clear to auscultation and percussion with normal breathing effort HEART: regular rate & rhythm and no murmurs and no lower extremity edema ABDOMEN:abdomen soft, non-tender and normal bowel sounds Musculoskeletal:no cyanosis of digits and no clubbing  NEURO: alert & oriented x 3 with fluent speech, no focal motor/sensory deficits BREAST: No palpable masses lungs or nodules in either right or left breasts. No palpable axillary supraclavicular or infraclavicular adenopathy no breast tenderness or nipple discharge.   LABORATORY DATA:  I have reviewed the data as listed Appointment on 01/24/2014  Component Date Value Ref Range Status  . WBC 01/24/2014 6.7  3.9 - 10.3 10e3/uL Final  . NEUT# 01/24/2014 4.6  1.5 - 6.5 10e3/uL Final  . HGB 01/24/2014 11.4* 11.6 - 15.9 g/dL Final  . HCT 01/24/2014 35.4  34.8 - 46.6 % Final  . Platelets 01/24/2014 263  145 - 400 10e3/uL Final  . MCV 01/24/2014 91.8  79.5 - 101.0 fL Final  . MCH 01/24/2014 29.6  25.1 - 34.0 pg Final  . MCHC 01/24/2014 32.3  31.5 - 36.0 g/dL Final  . RBC 01/24/2014 3.86  3.70 - 5.45 10e6/uL Final  . RDW 01/24/2014 13.8  11.2 - 14.5 % Final  . lymph# 01/24/2014 1.3  0.9 - 3.3 10e3/uL Final  . MONO# 01/24/2014 0.6  0.1 - 0.9 10e3/uL Final  . Eosinophils Absolute 01/24/2014 0.2  0.0 - 0.5 10e3/uL Final  . Basophils Absolute 01/24/2014 0.0  0.0 - 0.1 10e3/uL Final  . NEUT% 01/24/2014 68.5  38.4 - 76.8 % Final  . LYMPH% 01/24/2014 19.0  14.0 - 49.7 % Final  .  MONO% 01/24/2014 8.7  0.0 - 14.0 % Final  . EOS% 01/24/2014 3.3  0.0 - 7.0 % Final  . BASO% 01/24/2014 0.5  0.0 - 2.0 % Final    RADIOGRAPHIC STUDIES: I have personally reviewed the radiology reports and agreed with their findings. No results found.   ASSESSMENT & PLAN:  Cancer of upper-inner quadrant of female breast 1. 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 Tolerating it very well without any major problems or concerns. Today's breast exam did not reveal any lumps or nodules. Mammograms done on 11/04/2013 did not reveal any abnormalities. 1 year followup is recommended.  2. Survivorship: Recommended annual mammograms every 6 months exams. Patient has not had a Pap test in a while I recommended that she should at least see her gynecologist once a year because of being on aromatase inhibitor therapy. She  understands if she has any bleeding via vagina that she needs to see a gynecologist ASAP. DEXA scan was done in January 2014 showed excellent bone density. She continues on calcium vitamin D. next bone density will be done in 2 years. I encouraged the patient to continue to walk and do some exercise and eat more fruits and vegetables and decrease red meat consumption.  Followup in July 2016 after mammograms for physical exam.   Orders Placed This Encounter  Procedures  . MM Digital Diagnostic Bilat    Standing Status: Future     Number of Occurrences:      Standing Expiration Date: 01/24/2015    Order Specific Question:  Reason for Exam (SYMPTOM  OR DIAGNOSIS REQUIRED)    Answer:  Annual Follow up breast cancer    Order Specific Question:  Preferred imaging location?    Answer:  [facility]  . CBC with Differential    Standing Status: Future     Number of Occurrences:      Standing Expiration Date: 01/24/2015  . Comprehensive metabolic  panel (Cmet) - [facility]    Standing Status: Future     Number of Occurrences:      Standing Expiration Date: 01/24/2015   The patient has a good understanding of the overall plan. she agrees with it. She will call with any problems that may develop before her next visit here.  I spent 25 minutes counseling the patient face to face. The total time spent in the appointment was 30 minutes and more than 50% was on counseling and review of test results    Rulon Eisenmenger, MD 01/24/2014 11:25 AM

## 2014-01-24 NOTE — Assessment & Plan Note (Signed)
1. 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 Tolerating it very well without any major problems or concerns. Today's breast exam did not reveal any lumps or nodules. Mammograms done on 11/04/2013 did not reveal any abnormalities. 1 year followup is recommended.  2. Survivorship: Recommended annual mammograms every 6 months exams. Patient has not had a Pap test in a while I recommended that she should at least see her gynecologist once a year because of being on aromatase inhibitor therapy. She understands if she has any bleeding via vagina that she needs to see a gynecologist ASAP. DEXA scan was done in January 2014 showed excellent bone density. She continues on calcium vitamin D. next bone density will be done in 2 years. I encouraged the patient to continue to walk and do some exercise and eat more fruits and vegetables and decrease red meat consumption.  Followup in 6 months for physical exam.

## 2014-01-24 NOTE — Telephone Encounter (Signed)
per pof to sch pt appt-sch-pt stated will sch her own mamma-gave pt copy of sch

## 2014-01-26 NOTE — Addendum Note (Signed)
Addended by: Prentiss Bells on: 01/26/2014 06:33 PM   Modules accepted: Orders

## 2014-02-05 ENCOUNTER — Other Ambulatory Visit: Payer: Self-pay | Admitting: Family Medicine

## 2014-02-17 ENCOUNTER — Ambulatory Visit (INDEPENDENT_AMBULATORY_CARE_PROVIDER_SITE_OTHER): Payer: Medicare Other | Admitting: *Deleted

## 2014-02-17 DIAGNOSIS — Z23 Encounter for immunization: Secondary | ICD-10-CM

## 2014-02-27 ENCOUNTER — Ambulatory Visit (INDEPENDENT_AMBULATORY_CARE_PROVIDER_SITE_OTHER): Payer: Medicare Other | Admitting: Family Medicine

## 2014-02-27 ENCOUNTER — Ambulatory Visit
Admission: RE | Admit: 2014-02-27 | Discharge: 2014-02-27 | Disposition: A | Discharge status: Home / Self Care | Payer: Medicare Other | Source: Ambulatory Visit | Attending: Family Medicine | Admitting: Family Medicine

## 2014-02-27 ENCOUNTER — Encounter: Payer: Self-pay | Admitting: Family Medicine

## 2014-02-27 VITALS — BP 160/73 | HR 92 | Temp 98.5°F | Ht 68.0 in | Wt 289.0 lb

## 2014-02-27 DIAGNOSIS — M722 Plantar fascial fibromatosis: Secondary | ICD-10-CM

## 2014-02-27 NOTE — Patient Instructions (Signed)
Plantar Fasciitis  Plantar fasciitis is a common condition that causes foot pain. It is soreness (inflammation) of the band of tough fibrous tissue on the bottom of the foot that runs from the heel bone (calcaneus) to the ball of the foot. The cause of this soreness may be from excessive standing, poor fitting shoes, running on hard surfaces, being overweight, having an abnormal walk, or overuse (this is common in runners) of the painful foot or feet. It is also common in aerobic exercise dancers and ballet dancers.  SYMPTOMS   Most people with plantar fasciitis complain of:   Severe pain in the morning on the bottom of their foot especially when taking the first steps out of bed. This pain recedes after a few minutes of walking.   Severe pain is experienced also during walking following a long period of inactivity.   Pain is worse when walking barefoot or up stairs  DIAGNOSIS    Your caregiver will diagnose this condition by examining and feeling your foot.   Special tests such as X-rays of your foot, are usually not needed.  PREVENTION    Consult a sports medicine professional before beginning a new exercise program.   Walking programs offer a good workout. With walking there is a lower chance of overuse injuries common to runners. There is less impact and less jarring of the joints.   Begin all new exercise programs slowly. If problems or pain develop, decrease the amount of time or distance until you are at a comfortable level.   Wear good shoes and replace them regularly.   Stretch your foot and the heel cords at the back of the ankle (Achilles tendon) both before and after exercise.   Run or exercise on even surfaces that are not hard. For example, asphalt is better than pavement.   Do not run barefoot on hard surfaces.   If using a treadmill, vary the incline.   Do not continue to workout if you have foot or joint problems. Seek professional help if they do not improve.  HOME CARE INSTRUCTIONS     Avoid activities that cause you pain until you recover.   Use ice or cold packs on the problem or painful areas after working out.   Only take over-the-counter or prescription medicines for pain, discomfort, or fever as directed by your caregiver.   Soft shoe inserts or athletic shoes with air or gel sole cushions may be helpful.   If problems continue or become more severe, consult a sports medicine caregiver or your own health care provider. Cortisone is a potent anti-inflammatory medication that may be injected into the painful area. You can discuss this treatment with your caregiver.  MAKE SURE YOU:    Understand these instructions.   Will watch your condition.   Will get help right away if you are not doing well or get worse.  Document Released: 02/11/2001 Document Revised: 08/11/2011 Document Reviewed: 04/12/2008  ExitCare Patient Information 2015 [employer]. This information is not intended to replace advice given to you by your health care provider. Make sure you discuss any questions you have with your health care provider.

## 2014-02-27 NOTE — Assessment & Plan Note (Addendum)
Plantar fasciitis of the R foot, exam and history c/w this.  She is having point tenderness at the calcaneal insertion of the plantar fascia.  No evidence of DVT (Negative calf tenderness/Homan Sign).  Recommend ice baths for about 20 minutes at a time, 2-3 x per day.  As well, stretches of this area for about 2-3 minutes, 3 x per day.  Good supporting shoes and referral in to podiatrist today for her bunions (halux valgus).  F/U in 4-6 weeks if no improvement.

## 2014-02-27 NOTE — Progress Notes (Signed)
Erin Esparza is a 73 y.o. female who presents today for foot pain.  Foot pain - Previously has had, dx with plantar fasciitis.  R foot, worse with first step, insidious in onset.  Denies any calf tenderness, as she has had previous DVT.  However, currently on Xarelto, compliant, and denies any recent long drives or flights.  She has not done anything for this pain at this point.    Past Medical History  Diagnosis Date  . Pulmonary embolism   . DVT (deep venous thrombosis)   . Hypertension   . Venous stasis of lower extremity   . Asthma   . Right ankle pain   . Insulin resistance   . Clotting disorder   . Breast mass in female   . Shortness of breath   . Wears glasses   . Wears dentures     upper  . Wears partial dentures     lower  . Arthritis   . Diastolic congestive heart failure   . Breast cancer 01/06/12    left breast invasive ductal ca,dcis,ER/PR=+,  . Radiation 02/12/12 -03/12/12    left breast, total 50gy    History  Smoking status  . Former Smoker -- 0.30 packs/day for 15 years  . Types: Cigarettes  . Quit date: 06/02/1997  Smokeless tobacco  . Never Used    Family History  Problem Relation Age of Onset  . Heart disease Mother   . Cancer Sister     breast    Current Outpatient Prescriptions on File Prior to Visit  Medication Sig Dispense Refill  . acetaminophen (TYLENOL) 500 MG tablet Take 1 tablet (500 mg total) by mouth every 6 (six) hours as needed.  30 tablet  3  . anastrozole (ARIMIDEX) 1 MG tablet Take 1 tablet (1 mg total) by mouth daily.  90 tablet  1  . Calcium Carbonate (CALTRATE 600 PO) Take by mouth.      . enalapril (VASOTEC) 20 MG tablet TAKE 1 TABLET BY MOUTH TWICE DAILY  60 tablet  6  . Fluticasone-Salmeterol (ADVAIR DISKUS) 100-50 MCG/DOSE AEPB TAKE 1 PUFF BY MOUTH TWICE DAILY AS DIRECTED  60 each  3  . furosemide (LASIX) 40 MG tablet Take 1 tablet (40 mg total) by mouth daily as needed (For leg swelling.).  30 tablet  3  . hydrochlorothiazide  (HYDRODIURIL) 25 MG tablet TAKE 1 TABLET BY MOUTH DAILY  90 tablet  0  . Multiple Vitamin (MULTIVITAMIN) tablet Take 1 tablet by mouth daily.      Alveda Reasons 20 MG TABS tablet TAKE 1 TABLET BY MOUTH EVERY DAY  30 tablet  0   No current facility-administered medications on file prior to visit.    ROS: Per HPI.  All other systems reviewed and are negative.   Physical Exam Filed Vitals:   02/27/14 1133  BP: 160/73  Pulse: 92  Temp: 98.5 F (36.9 C)    Physical Examination: General appearance - alert, well appearing, and in no distress R foot - Pes Planus, halux valgus, collapse of longitudinal and transverse arches.

## 2014-03-07 ENCOUNTER — Other Ambulatory Visit: Payer: Self-pay | Admitting: Family Medicine

## 2014-03-09 ENCOUNTER — Ambulatory Visit (INDEPENDENT_AMBULATORY_CARE_PROVIDER_SITE_OTHER): Payer: Medicare Other | Admitting: Podiatry

## 2014-03-09 ENCOUNTER — Ambulatory Visit (INDEPENDENT_AMBULATORY_CARE_PROVIDER_SITE_OTHER): Payer: Medicare Other

## 2014-03-09 ENCOUNTER — Encounter: Payer: Self-pay | Admitting: Podiatry

## 2014-03-09 ENCOUNTER — Other Ambulatory Visit: Payer: Self-pay | Admitting: Oncology

## 2014-03-09 VITALS — BP 168/72 | HR 88 | Resp 15 | Ht 67.0 in | Wt 298.0 lb

## 2014-03-09 DIAGNOSIS — M2011 Hallux valgus (acquired), right foot: Secondary | ICD-10-CM

## 2014-03-09 DIAGNOSIS — M79671 Pain in right foot: Secondary | ICD-10-CM

## 2014-03-09 DIAGNOSIS — C50212 Malignant neoplasm of upper-inner quadrant of left female breast: Secondary | ICD-10-CM

## 2014-03-09 DIAGNOSIS — M21611 Bunion of right foot: Secondary | ICD-10-CM

## 2014-03-09 DIAGNOSIS — M2041 Other hammer toe(s) (acquired), right foot: Secondary | ICD-10-CM

## 2014-03-09 MED ORDER — TRIAMCINOLONE ACETONIDE 10 MG/ML IJ SUSP
10.0000 mg | Freq: Once | INTRAMUSCULAR | Status: AC
  Administered 2014-03-09: 10 mg

## 2014-03-09 NOTE — Progress Notes (Signed)
   Subjective:    Patient ID: Erin Esparza, female    DOB: 12-30-1940, 73 y.o.   MRN: [id number]  HPI Comments: Pt states she had pain in her right heel since 02/24/2014, and was seen 02/27/2014 by Dr. Awanda Mink and x-rayed, diagnosed plantar fasciitis, prescribed Tylenol, ice bathes, and massage and elevation.  Pt states she has a corn on her right 5th toe.     Review of Systems  All other systems reviewed and are negative.      Objective:   Physical Exam        Assessment & Plan:

## 2014-03-09 NOTE — Progress Notes (Signed)
Subjective:     Patient ID: Erin Esparza, female   DOB: Oct 26, 1940, 73 y.o.   MRN: [id number]  HPI patient states she's had pain in her right heel that it's gotten some better but now she has a lot of pain in the outside of her right ankle do to walking differently and a painful toe right fifth and also a bunion deformity   Review of Systems  All other systems reviewed and are negative.      Objective:   Physical Exam  Nursing note and vitals reviewed. Constitutional: She is oriented to person, place, and time.  Cardiovascular: Intact distal pulses.   Musculoskeletal: Normal range of motion.  Neurological: She is oriented to person, place, and time.  Skin: Skin is warm and dry.   neurovascular status found to be diminished but intact with feet warm and digits well perfused. Significant edema in the ankle region of both feet and lower leg secondary to probable either lymphatic issue or chronic venous disease. With negative Homans sign noted. Patient is noted to have discomfort in the sinus tarsi right with inflammation prominent metatarsal head right over left with redness and keratotic lesion fifth toe right foot     Assessment:     Sinus tarsitis right with inflammatory capsulitis and also hammertoe deformity fifth right structural bunion deformity right    Plan:     H&P and conditions discussed. Today injected the sinus tarsi right 3 mg Kenalog 5 mg Xylocaine and debris did lesion fifth digit right and advised to return if symptoms persist

## 2014-03-22 ENCOUNTER — Encounter: Payer: Self-pay | Admitting: Podiatry

## 2014-03-22 ENCOUNTER — Ambulatory Visit (INDEPENDENT_AMBULATORY_CARE_PROVIDER_SITE_OTHER): Payer: Medicare Other | Admitting: Podiatry

## 2014-03-22 VITALS — BP 154/66 | HR 87 | Resp 16

## 2014-03-22 DIAGNOSIS — M779 Enthesopathy, unspecified: Secondary | ICD-10-CM

## 2014-03-22 MED ORDER — TRIAMCINOLONE ACETONIDE 10 MG/ML IJ SUSP
10.0000 mg | Freq: Once | INTRAMUSCULAR | Status: AC
  Administered 2014-03-22: 10 mg

## 2014-03-22 NOTE — Progress Notes (Signed)
Subjective:     Patient ID: Erin Esparza, female   DOB: [birth year], 73 y.o.   MRN: [id number]  HPI patient points to right foot stating the ankle is still hurting with some improvement from previous and especially better after taking Tylenol   Review of Systems     Objective:   Physical Exam Neurovascular status intact with continued discomfort in the right sinus tarsi with moderate diminishment of symptoms    Assessment:     Sinus tarsitis right that is present but slightly improved    Plan:     Today I advised on physical therapy ice therapy and I reinjected the sinus tarsi 3 mg Kenalog 5 mg Xylocaine and advised on bracing if symptoms were to persist

## 2014-04-04 ENCOUNTER — Ambulatory Visit (INDEPENDENT_AMBULATORY_CARE_PROVIDER_SITE_OTHER): Payer: Medicare Other | Admitting: Podiatry

## 2014-04-04 ENCOUNTER — Encounter: Payer: Self-pay | Admitting: Podiatry

## 2014-04-04 VITALS — BP 151/75 | HR 87 | Resp 16

## 2014-04-04 DIAGNOSIS — M779 Enthesopathy, unspecified: Secondary | ICD-10-CM

## 2014-04-04 MED ORDER — TRIAMCINOLONE ACETONIDE 10 MG/ML IJ SUSP
10.0000 mg | Freq: Once | INTRAMUSCULAR | Status: AC
Start: 2014-04-04 — End: 2014-04-04
  Administered 2014-04-04: 10 mg

## 2014-04-04 NOTE — Progress Notes (Signed)
Subjective:     Patient ID: Erin Esparza, female   DOB: [birth year], 73 y.o.   MRN: [id number]  HPIpatient presents stating I'm still having problems with this ankle and I thought that I was improved but it seems like it's recur   Review of Systems     Objective:   Physical Exam Neurovascular status intact with pain in the sinus tarsi right that's been present with patient having obesity and swelling which makes treatment more difficult    Assessment:     Sinus tarsitis right with inflammation and obese patient with swelling    Plan:     We are going to try 1 more injection and I did inject with 3 mg Kenalog 5 mg Xylocaine and I explained I will not be able to do this for at least another 4-6 months and hopefully this will improve her condition and if not she is to reappoint

## 2014-04-06 ENCOUNTER — Other Ambulatory Visit: Payer: Self-pay | Admitting: Family Medicine

## 2014-06-02 ENCOUNTER — Other Ambulatory Visit: Payer: Self-pay | Admitting: Family Medicine

## 2014-06-04 ENCOUNTER — Other Ambulatory Visit: Payer: Self-pay | Admitting: Family Medicine

## 2014-06-27 DIAGNOSIS — H25013 Cortical age-related cataract, bilateral: Secondary | ICD-10-CM | POA: Diagnosis not present

## 2014-06-27 DIAGNOSIS — H3531 Nonexudative age-related macular degeneration: Secondary | ICD-10-CM | POA: Diagnosis not present

## 2014-07-08 ENCOUNTER — Other Ambulatory Visit: Payer: Self-pay | Admitting: Family Medicine

## 2014-07-20 DIAGNOSIS — H25813 Combined forms of age-related cataract, bilateral: Secondary | ICD-10-CM | POA: Diagnosis not present

## 2014-07-20 DIAGNOSIS — H40033 Anatomical narrow angle, bilateral: Secondary | ICD-10-CM | POA: Diagnosis not present

## 2014-07-20 DIAGNOSIS — H40013 Open angle with borderline findings, low risk, bilateral: Secondary | ICD-10-CM | POA: Diagnosis not present

## 2014-07-20 DIAGNOSIS — H3531 Nonexudative age-related macular degeneration: Secondary | ICD-10-CM | POA: Diagnosis not present

## 2014-08-03 ENCOUNTER — Other Ambulatory Visit: Payer: Self-pay | Admitting: Family Medicine

## 2014-08-08 ENCOUNTER — Other Ambulatory Visit: Payer: Self-pay | Admitting: *Deleted

## 2014-08-08 MED ORDER — ENALAPRIL MALEATE 20 MG PO TABS: 20.0000 mg | ORAL_TABLET | Freq: Two times a day (BID) | ORAL | Status: DC

## 2014-08-31 ENCOUNTER — Other Ambulatory Visit: Payer: Self-pay | Admitting: Family Medicine

## 2014-10-06 ENCOUNTER — Other Ambulatory Visit: Payer: Self-pay | Admitting: Hematology and Oncology

## 2014-10-06 ENCOUNTER — Other Ambulatory Visit: Payer: Self-pay | Admitting: Family Medicine

## 2014-10-06 DIAGNOSIS — E2839 Other primary ovarian failure: Secondary | ICD-10-CM

## 2014-10-06 DIAGNOSIS — Z79811 Long term (current) use of aromatase inhibitors: Secondary | ICD-10-CM

## 2014-10-09 ENCOUNTER — Other Ambulatory Visit: Payer: Self-pay | Admitting: *Deleted

## 2014-10-09 MED ORDER — RIVAROXABAN 20 MG PO TABS: 20.0000 mg | ORAL_TABLET | Freq: Every day | ORAL | Status: DC

## 2014-10-10 ENCOUNTER — Telehealth: Payer: Self-pay | Admitting: Family Medicine

## 2014-10-10 NOTE — Telephone Encounter (Signed)
Pt informed that Rx for Xarelto was sent in to [facility] yesterday 10/09/14 by Dr. Lacinda Axon.  Derl Barrow, RN

## 2014-10-10 NOTE — Telephone Encounter (Signed)
Pt is checking status of rx refill for xarelto, pt goes to [facility]

## 2014-10-31 ENCOUNTER — Other Ambulatory Visit: Payer: Self-pay | Admitting: Family Medicine

## 2014-11-13 ENCOUNTER — Ambulatory Visit
Admission: RE | Admit: 2014-11-13 | Discharge: 2014-11-13 | Disposition: A | Discharge status: Home / Self Care | Payer: Medicaid Other | Source: Ambulatory Visit | Attending: Hematology and Oncology | Admitting: Hematology and Oncology

## 2014-11-13 ENCOUNTER — Telehealth: Payer: Self-pay | Admitting: *Deleted

## 2014-11-13 ENCOUNTER — Ambulatory Visit
Admission: RE | Admit: 2014-11-13 | Discharge: 2014-11-13 | Disposition: A | Discharge status: Home / Self Care | Payer: Medicare Other | Source: Ambulatory Visit | Attending: Hematology and Oncology | Admitting: Hematology and Oncology

## 2014-11-13 ENCOUNTER — Other Ambulatory Visit: Payer: Self-pay | Admitting: Nurse Practitioner

## 2014-11-13 ENCOUNTER — Other Ambulatory Visit: Payer: Medicaid Other

## 2014-11-13 DIAGNOSIS — E2839 Other primary ovarian failure: Secondary | ICD-10-CM

## 2014-11-13 DIAGNOSIS — Z780 Asymptomatic menopausal state: Secondary | ICD-10-CM | POA: Diagnosis not present

## 2014-11-13 DIAGNOSIS — Z79811 Long term (current) use of aromatase inhibitors: Secondary | ICD-10-CM

## 2014-11-13 DIAGNOSIS — Z13820 Encounter for screening for osteoporosis: Secondary | ICD-10-CM | POA: Diagnosis not present

## 2014-11-13 DIAGNOSIS — C50212 Malignant neoplasm of upper-inner quadrant of left female breast: Secondary | ICD-10-CM

## 2014-11-13 DIAGNOSIS — Z853 Personal history of malignant neoplasm of breast: Secondary | ICD-10-CM | POA: Diagnosis not present

## 2014-11-13 NOTE — Telephone Encounter (Signed)
Patient scheduled today for mammogram and bone density.  Needs orders signed by 1:00 today.  Today [facility] discovered orders need change to include 3-D & need co-sign by Dr. Lindi Adie.  Dr. Lindi Adie out of office this week.  Gentry Fitz NP will sign.  Instructed [place] to [employer].

## 2014-11-27 ENCOUNTER — Telehealth: Payer: Self-pay

## 2014-11-27 NOTE — Telephone Encounter (Signed)
Bone density results rcvd from [facility].  Reviewed by Dr. Lindi Adie, Sent to scan.

## 2014-11-28 DIAGNOSIS — Z853 Personal history of malignant neoplasm of breast: Secondary | ICD-10-CM | POA: Diagnosis not present

## 2014-11-29 NOTE — Progress Notes (Signed)
I have reviewed this visit and discussed with Suzanne Lineberry and agree with her documentation.  Jayce Cook DO  

## 2014-12-25 ENCOUNTER — Other Ambulatory Visit (HOSPITAL_BASED_OUTPATIENT_CLINIC_OR_DEPARTMENT_OTHER): Payer: Medicare Other

## 2014-12-25 ENCOUNTER — Ambulatory Visit (HOSPITAL_BASED_OUTPATIENT_CLINIC_OR_DEPARTMENT_OTHER): Payer: Medicare Other | Admitting: Hematology and Oncology

## 2014-12-25 ENCOUNTER — Telehealth: Payer: Self-pay | Admitting: Hematology and Oncology

## 2014-12-25 ENCOUNTER — Encounter: Payer: Self-pay | Admitting: Hematology and Oncology

## 2014-12-25 VITALS — BP 153/71 | HR 66 | Temp 98.9°F | Resp 18 | Ht 67.0 in | Wt 290.9 lb

## 2014-12-25 DIAGNOSIS — Z86718 Personal history of other venous thrombosis and embolism: Secondary | ICD-10-CM

## 2014-12-25 DIAGNOSIS — C50212 Malignant neoplasm of upper-inner quadrant of left female breast: Secondary | ICD-10-CM

## 2014-12-25 DIAGNOSIS — Z79811 Long term (current) use of aromatase inhibitors: Secondary | ICD-10-CM

## 2014-12-25 DIAGNOSIS — Z7901 Long term (current) use of anticoagulants: Secondary | ICD-10-CM

## 2014-12-25 DIAGNOSIS — Z170 Estrogen receptor positive status [ER+]: Secondary | ICD-10-CM | POA: Diagnosis not present

## 2014-12-25 LAB — CBC WITH DIFFERENTIAL/PLATELET
BASO%: 0.3 % (ref 0.0–2.0)
Basophils Absolute: 0 10*3/uL (ref 0.0–0.1)
EOS%: 2.7 % (ref 0.0–7.0)
Eosinophils Absolute: 0.2 10*3/uL (ref 0.0–0.5)
HCT: 35 % (ref 34.8–46.6)
HGB: 11.4 g/dL — ABNORMAL LOW (ref 11.6–15.9)
LYMPH%: 20.8 % (ref 14.0–49.7)
MCH: 29.9 pg (ref 25.1–34.0)
MCHC: 32.6 g/dL (ref 31.5–36.0)
MCV: 91.9 fL (ref 79.5–101.0)
MONO#: 0.6 10*3/uL (ref 0.1–0.9)
MONO%: 8.4 % (ref 0.0–14.0)
NEUT#: 5 10*3/uL (ref 1.5–6.5)
NEUT%: 67.8 % (ref 38.4–76.8)
Platelets: 246 10*3/uL (ref 145–400)
RBC: 3.81 10*6/uL (ref 3.70–5.45)
RDW: 13.5 % (ref 11.2–14.5)
WBC: 7.4 10*3/uL (ref 3.9–10.3)
lymph#: 1.5 10*3/uL (ref 0.9–3.3)

## 2014-12-25 LAB — COMPREHENSIVE METABOLIC PANEL (CC13)
ALT: 17 U/L (ref 0–55)
AST: 21 U/L (ref 5–34)
Albumin: 3.4 g/dL — ABNORMAL LOW (ref 3.5–5.0)
Alkaline Phosphatase: 99 U/L (ref 40–150)
Anion Gap: 8 mEq/L (ref 3–11)
BUN: 13.7 mg/dL (ref 7.0–26.0)
CO2: 29 mEq/L (ref 22–29)
Calcium: 10.1 mg/dL (ref 8.4–10.4)
Chloride: 106 mEq/L (ref 98–109)
Creatinine: 0.9 mg/dL (ref 0.6–1.1)
EGFR: 69 mL/min/{1.73_m2} — ABNORMAL LOW (ref >90)
Glucose: 99 mg/dl (ref 70–140)
Potassium: 4.1 mEq/L (ref 3.5–5.1)
Sodium: 142 mEq/L (ref 136–145)
Total Bilirubin: 0.41 mg/dL (ref 0.20–1.20)
Total Protein: 7.8 g/dL (ref 6.4–8.3)

## 2014-12-25 NOTE — Progress Notes (Signed)
Patient Care Team: Virginia Crews, MD as PCP - General (Family Medicine) Consuela Mimes, MD (Oncology)  DIAGNOSIS: Primary cancer of upper inner quadrant of left female breast   Staging form: Breast, AJCC 7th Edition     Clinical: No stage assigned - Unsigned     Pathologic: Stage IA (T46mc, N0, cM0) - Signed by VRulon Eisenmenger MD on 01/24/2014   SUMMARY OF ONCOLOGIC HISTORY:   Primary cancer of upper inner quadrant of left female breast   10/23/2011 Mammogram Suspicious mass at 10:00 position 7 cm from the left nipple. Ultrasound irregular hypoechoic mass 7 x 6 x 5 mm   01/06/2012 Initial Biopsy Initial biopsy was benign . Dr. CBrantley Stageperformed needle localization excisional biopsy which showed microinvasive focus of invasive ductal carcinoma in the setting of DCIS grade 2 ER + PR + HER-2 Neg Ki67:5%; T1 mic N0 M0 stage IA   01/20/2012 Initial Diagnosis Cancer of upper-inner quadrant of female breast    - 03/12/2012 Radiation Therapy Radiation therapy to lumpectomy site   04/25/2012 -  Anti-estrogen oral therapy Arimidex 1 mg by mouth daily. 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 She is tolerating extremely well. Very occasional hot flashes. Denies any myalgias or bone pain. Denies any lumps or nodules in the breasts. She stays active by doing water aerobics and exercises.  REVIEW OF SYSTEMS:   Constitutional: Denies fevers, chills or abnormal weight loss Eyes: Denies blurriness of vision Ears, nose, mouth, throat, and face: Denies mucositis or sore throat Respiratory: Denies cough, dyspnea or wheezes Cardiovascular: Denies palpitation, chest discomfort or lower extremity swelling Gastrointestinal:  Denies nausea, heartburn or change in bowel habits Skin: Denies abnormal skin rashes Lymphatics:  Denies new lymphadenopathy or easy bruising Neurological:Denies numbness, tingling or new weaknesses Behavioral/Psych: Mood is stable, no new changes  Breast:  denies any pain or lumps or nodules in either breasts All other systems were reviewed with the patient and are negative.  I have reviewed the past medical history, past surgical history, social history and family history with the patient and they are unchanged from previous note.  ALLERGIES:  has No Known Allergies.  MEDICATIONS:  Current Outpatient Prescriptions  Medication Sig Dispense Refill  . acetaminophen (TYLENOL) 500 MG tablet Take 1 tablet (500 mg total) by mouth every 6 (six) hours as needed. 30 tablet 3  . ADVAIR DISKUS 100-50 MCG/DOSE AEPB INHALE 1 PUFF BY MOUTH TWICE DAILY AS DIRECTED 60 each 3  . anastrozole (ARIMIDEX) 1 MG tablet TAKE 1 TABLET BY MOUTH EVERY DAY 90 tablet 3  . Calcium Carbonate (CALTRATE 600 PO) Take by mouth.    . enalapril (VASOTEC) 20 MG tablet Take 1 tablet (20 mg total) by mouth 2 (two) times daily. 60 tablet 6  . furosemide (LASIX) 40 MG tablet Take 1 tablet (40 mg total) by mouth daily as needed (For leg swelling.). 30 tablet 3  . hydrochlorothiazide (HYDRODIURIL) 25 MG tablet TAKE 1 TABLET BY MOUTH DAILY 90 tablet 0  . hydrochlorothiazide (HYDRODIURIL) 25 MG tablet TAKE 1 TABLET BY MOUTH DAILY 90 tablet 0  . Multiple Vitamin (MULTIVITAMIN) tablet Take 1 tablet by mouth daily.    . rivaroxaban (XARELTO) 20 MG TABS tablet Take 1 tablet (20 mg total) by mouth daily. 90 tablet 3  .  XARELTO 20 MG TABS tablet TAKE 1 TABLET BY MOUTH EVERY DAY 30 tablet 0   No current facility-administered medications for this visit.    PHYSICAL EXAMINATION: ECOG PERFORMANCE STATUS: 1 - Symptomatic but completely ambulatory  Filed Vitals:   12/25/14 1149  BP: 153/71  Pulse: 66  Temp: 98.9 F (37.2 C)  Resp: 18   Filed Weights   12/25/14 1149  Weight: 290 lb 14.4 oz (131.951 kg)    GENERAL:alert, no  distress and comfortable SKIN: skin color, texture, turgor are normal, no rashes or significant lesions EYES: normal, Conjunctiva are pink and non-injected, sclera clear OROPHARYNX:no exudate, no erythema and lips, buccal mucosa, and tongue normal  NECK: supple, thyroid normal size, non-tender, without nodularity LYMPH:  no palpable lymphadenopathy in the cervical, axillary or inguinal LUNGS: clear to auscultation and percussion with normal breathing effort HEART: regular rate & rhythm and no murmurs and no lower extremity edema ABDOMEN:abdomen soft, non-tender and normal bowel sounds Musculoskeletal:no cyanosis of digits and no clubbing  NEURO: alert & oriented x 3 with fluent speech, no focal motor/sensory deficits BREAST: No palpable masses or nodules in either right or left breasts. No palpable axillary supraclavicular or infraclavicular adenopathy no breast tenderness or nipple discharge. (exam performed in the presence of a chaperone)  LABORATORY DATA:  I have reviewed the data as listed   Chemistry      Component Value Date/Time   NA 140 01/24/2014 1051   NA 138 01/02/2012 1030   K 3.9 01/24/2014 1051   K 3.9 01/02/2012 1030   CL 102 10/04/2012 1159   CL 100 01/02/2012 1030   CO2 28 01/24/2014 1051   CO2 28 01/02/2012 1030   BUN 16.9 01/24/2014 1051   BUN 15 01/02/2012 1030   CREATININE 0.8 01/24/2014 1051   CREATININE 0.75 01/02/2012 1030      Component Value Date/Time   CALCIUM 10.2 01/24/2014 1051   CALCIUM 9.4 01/02/2012 1030   ALKPHOS 87 01/24/2014 1051   ALKPHOS 77 01/02/2012 1030   AST 18 01/24/2014 1051   AST 13 01/02/2012 1030   ALT 11 01/24/2014 1051   ALT 8 01/02/2012 1030   BILITOT 0.34 01/24/2014 1051   BILITOT 0.3 01/02/2012 1030       Lab Results  Component Value Date   WBC 7.4 12/25/2014   HGB 11.4* 12/25/2014   HCT 35.0 12/25/2014   MCV 91.9 12/25/2014   PLT 246 12/25/2014   NEUTROABS 5.0 12/25/2014     RADIOGRAPHIC STUDIES: I have  personally reviewed the radiology reports and agreed with their findings.  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 (DVT and PE diagnosed in 2012, currently on xarelto)  Arimidex toxicities: No major problems or concerns DVT and PE: takes xarelto  Breast Cancer Surveillance: 1. Breast exam 12/25/2014: Normal 2. Mammogram next 13 2016 No abnormalities. Postsurgical changes. Breast Density Category B. I recommended that she get 3-D mammograms for surveillance. Discussed the differences between different breast density categories. 3. Bone density 11/14/2014: Normal T score 1.8  Return to clinic once a year for follow-up   No orders of the defined types were placed in this encounter.   The patient has a good understanding of the overall plan. she  agrees with it. she will call with any problems that may develop before the next visit here.   Rulon Eisenmenger, MD

## 2014-12-25 NOTE — Assessment & Plan Note (Signed)
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 (DVT and PE diagnosed in 2012, currently on xarelto)  Arimidex toxicities: No major problems or concerns  Breast Cancer Surveillance: 1. Breast exam 12/25/2014: Normal 2. Mammogram next 13 2016 No abnormalities. Postsurgical changes. Breast Density Category B. I recommended that she get 3-D mammograms for surveillance. Discussed the differences between different breast density categories. 3. Bone density 11/14/2014: Normal T score 1.8  Return to clinic once a year for follow-up

## 2014-12-25 NOTE — Telephone Encounter (Signed)
Appointments made and avs printed for patient °

## 2015-01-24 ENCOUNTER — Other Ambulatory Visit: Payer: Self-pay | Admitting: Family Medicine

## 2015-01-30 ENCOUNTER — Ambulatory Visit (INDEPENDENT_AMBULATORY_CARE_PROVIDER_SITE_OTHER): Payer: Medicare Other | Admitting: Family Medicine

## 2015-01-30 ENCOUNTER — Encounter: Payer: Self-pay | Admitting: Family Medicine

## 2015-01-30 VITALS — BP 160/60 | HR 90 | Temp 97.8°F | Wt 294.0 lb

## 2015-01-30 DIAGNOSIS — M545 Low back pain, unspecified: Secondary | ICD-10-CM

## 2015-01-30 NOTE — Patient Instructions (Signed)
I think this was likely a pulled muscle or possibly a pinched nerve Hopefully will just get better with time Continue tylenol, heat/ice  If getting worse, please return to be seen later this week If still present but not worse later this week, call us and I can order xray If any loss of bowel/bladder control, weakness in legs, numbness in crotch area, fevers please go to ER  Be well, Dr. Ardelia Mems

## 2015-01-30 NOTE — Progress Notes (Signed)
  HPI:  Pt presents for a same day appointment to discuss back pain.  First noticed the pain on Sunday. Woke up with pain in her left lower back. The pain is not constant, but happens every few minutes. Happens only with position changes. Pain lasts for a few seconds. Sharp in nature. No fevers. No weakness in legs, saddle anesthesia. Last bowel movement was Saturday or Sunday. Takes stool softener usually. No problems with urinating. Took some Tylenol which helped. Also applied an ice pack. This morning pain seems to have wrapped around from her back to her stomach. Still happening with position changes. Eating and drinking well. No rashes. Overall it is improved from 2 days ago.  ROS: See HPI  [place]: History of asthma, breast cancer status post treatment in surveillance, DVT and PE now on Xarelto, hypertension  PHYSICAL EXAM: BP 160/60 mmHg  Pulse 90  Temp(Src) 97.8 F (36.6 C) (Oral)  Wt 294 lb (133.358 kg) Gen: No acute distress, pleasant, cooperative HEENT: Normocephalic, atraumatic Lungs: Normal respiratory effort Abdomen: Soft, nontender to palpation, no masses Back: Posterior back nontender to palpation. No CVA tenderness. No rashes seen over left thorax. Neuro: Grossly nonfocal, speech normal, full strength in bilateral lower extremities  ASSESSMENT/PLAN:  1. Back pain: Etiology unclear, possibly pinched nerve or pulled muscle, but overall improving. No red flags. Patient does have a history of breast cancer that is in surveillance. Advised that if this pain persists into later in the week, that she call us and let us know and we will order a x-ray of her back to ensure no bony lesions. I suspect this will just get better with time. Discussed red flags which should prompt emergency care. Otherwise continue Tylenol, heat/ice.  FOLLOW UP: F/u as needed if symptoms worsen or do not improve.   Bryan. Ardelia Mems, [place]

## 2015-02-01 ENCOUNTER — Other Ambulatory Visit: Payer: Self-pay | Admitting: Family Medicine

## 2015-02-01 NOTE — Telephone Encounter (Signed)
Patient is calling to request a refill on hydrochlorothiazide 25 mg, she would like this medication ASAP considering the weekend in approaching and she is leaving for an out-of-town trip tomorrow. Pt pharmacy ([facility] on [address]) sent a request but it went to the wrong provider. Sadie Reynolds, ASA

## 2015-02-05 ENCOUNTER — Other Ambulatory Visit: Payer: Self-pay | Admitting: Family Medicine

## 2015-02-06 MED ORDER — HYDROCHLOROTHIAZIDE 25 MG PO TABS: 25.0000 mg | ORAL_TABLET | Freq: Every day | ORAL | Status: DC

## 2015-02-16 ENCOUNTER — Encounter: Payer: Self-pay | Admitting: Family Medicine

## 2015-02-16 ENCOUNTER — Ambulatory Visit (INDEPENDENT_AMBULATORY_CARE_PROVIDER_SITE_OTHER): Payer: Medicare Other | Admitting: Family Medicine

## 2015-02-16 VITALS — BP 139/62 | HR 74 | Temp 98.2°F | Ht 67.0 in | Wt 291.4 lb

## 2015-02-16 DIAGNOSIS — I10 Essential (primary) hypertension: Secondary | ICD-10-CM

## 2015-02-16 DIAGNOSIS — Z23 Encounter for immunization: Secondary | ICD-10-CM | POA: Diagnosis not present

## 2015-02-16 DIAGNOSIS — Z0000 Encounter for general adult medical examination without abnormal findings: Secondary | ICD-10-CM

## 2015-02-16 LAB — LIPID PANEL
Cholesterol: 150 mg/dL (ref 125–200)
HDL: 50 mg/dL (ref >=46)
LDL Cholesterol: 87 mg/dL (ref <130)
Total CHOL/HDL Ratio: 3 Ratio (ref <=5.0)
Triglycerides: 64 mg/dL (ref <150)
VLDL: 13 mg/dL (ref <30)

## 2015-02-16 LAB — POCT GLYCOSYLATED HEMOGLOBIN (HGB A1C): Hemoglobin A1C: 6

## 2015-02-16 MED ORDER — HYDROCHLOROTHIAZIDE 25 MG PO TABS
25.0000 mg | ORAL_TABLET | Freq: Every day | ORAL | Status: DC
Start: 2015-02-16 — End: 2015-02-19

## 2015-02-16 NOTE — Progress Notes (Signed)
   Subjective:   74 y.o. year old female presents for well woman/preventative visit and annual GYN examination.  Acute Concerns:  Burning in top of left foot - lasts seconds - 1-2 times weekly - doesn't bother her  HTN - needs refill on HCTZ, didn't take today - taking enalipril - denies med SEs, CP, SOB, HAs - has chronic LE edema  Diet: "pretty good" - not restrictive in diet - eats a lot of fruits and vegetables - not working on weight loss  Exercise:  - water aerobics twice weekly  Sexual/Birth History: [id number] Not cuurently sexually active  Birth Control: postmenopausal  POA/Living Will: doesn't have one, would like information  Social:  Social History   Social History  . Marital Status: Widowed    Spouse Name: N/A  . Number of Children: 5  . Years of Education: 12   Occupational History  . Retired    Social History Main Topics  . Smoking status: Former Smoker -- 0.30 packs/day for 15 years    Types: Cigarettes    Quit date: 06/02/1997  . Smokeless tobacco: Never Used  . Alcohol Use: No  . Drug Use: No  . Sexual Activity: Not Currently   Other Topics Concern  . None   Social History Narrative   Health Care POA:    Emergency Contact: daughter, Freddie Breech (c) [phone] or [place] (C) [phone]   End of Life Plan:    Who lives with you: daughter - Boch   Any pets: dog   Diet: Pt has a varied diet.   Exercise: Pt regularly does low impact aerobic exercises with TV program.    Seatbelts: Pt reports wearing seatbelt when in vehicles.    Sun Exposure/Protection:    Hobbies: Bingo          Immunization:  Tdap/TD: 12/2005  Influenza: to be done today  Pneumococcal: 01/2014  Herpes Zoster: 03/2011  Cancer Screening:  Pap Smear: n/a, age >7  Mammogram: had one in 11/2014, h/o breast cancer, followed by oncology  FOBT: 03/2011, will repeat today  Dexa: 11/2014  Physical Exam: VITALS:  Filed Vitals:   02/16/15 1034  BP: 139/62  Pulse:  74  Temp: 98.2 F (36.8 C)   GEN: Sitting in NAD HEENT: NCAT, MMM, EOMI, PERRL, anicteric sclerae, TMs clear, OP clear CV: RRR, no MRG, intact distal pulses Resp: Non-labored, CTAB, no wheezes noted Abd: Soft, Obese, NTND, BS present, no guarding or organomegaly Ext: WWP, 2+ edema b/l to knee, chronic venous stasis changes MSK: Full ROM, strength intact Neuro: Alert and oriented, speech normal  ASSESSMENT & PLAN: 74 y.o. female presents for annual well woman/preventative exam and GYN exam.   HYPERTENSION, BENIGN ESSENTIAL Well-controlled Continue HCTZ and enalapril Refill sent Recent Bmet with normal electrolytes and creatinine Screening lipid panel and A1c today Follow-up in 6 months  Preventative health care Up-to-date on cancer screenings and vaccines Flu shot today Stool cards given today Screening lipid panel and A1c today Follow-up in one year for next wellness visit     Virginia Crews, MD MPH PGY-2,  [facility] 02/16/2015  11:22 AM

## 2015-02-16 NOTE — Patient Instructions (Signed)
Nice to meet you today. I'm glad you're doing well. Continue current medications. We will screen you for high cholesterol and diabetes. Someone will call you or send you a letter with these results when they're available. I refilled her medicines. Please complete the stool cards and return them as per the instructions.  I will see you back in one year for your next wellness visit.  Take care, Dr. B  Things to do to keep yourself healthy  - Exercise at least 30-45 minutes a day, 3-4 days a week.  - Eat a low-fat diet with lots of fruits and vegetables, up to 7-9 servings per day.  - Seatbelts can save your life. Wear them always.  - Smoke detectors on every level of your home, check batteries every year.  - Eye Doctor - have an eye exam every 1-2 years  - Safe sex - if you may be exposed to STDs, use a condom.  - Alcohol -  If you drink, do it moderately, less than 2 drinks per day.  - [place]. Choose someone to speak for you if you are not able.  - Depression is common in our stressful world.If you're feeling down or losing interest in things you normally enjoy, please come in for a visit.  - Violence - If anyone is threatening or hurting you, please call immediately.

## 2015-02-16 NOTE — Assessment & Plan Note (Signed)
Up-to-date on cancer screenings and vaccines Flu shot today Stool cards given today Screening lipid panel and A1c today Follow-up in one year for next wellness visit

## 2015-02-16 NOTE — Assessment & Plan Note (Addendum)
Well-controlled Continue HCTZ and enalapril Refill sent Recent Bmet with normal electrolytes and creatinine Screening lipid panel and A1c today Follow-up in 6 months

## 2015-02-17 ENCOUNTER — Encounter: Payer: Self-pay | Admitting: Family Medicine

## 2015-02-19 ENCOUNTER — Other Ambulatory Visit: Payer: Self-pay | Admitting: Family Medicine

## 2015-02-19 MED ORDER — HYDROCHLOROTHIAZIDE 25 MG PO TABS: 25.0000 mg | ORAL_TABLET | Freq: Every day | ORAL | Status: DC

## 2015-02-19 NOTE — Telephone Encounter (Signed)
Pt called because the pharmacy said that they didn't receive the prescription electronically for her Hydrochlorothiazide. Can we re-send this or call this in. jw

## 2015-02-19 NOTE — Telephone Encounter (Signed)
Patient stated that [facility] did not received her refill for HCTZ on 02/06/15.  Refill resent to [facility].  Derl Barrow, RN

## 2015-03-09 ENCOUNTER — Other Ambulatory Visit: Payer: Self-pay

## 2015-03-09 DIAGNOSIS — C50212 Malignant neoplasm of upper-inner quadrant of left female breast: Secondary | ICD-10-CM

## 2015-03-09 MED ORDER — ANASTROZOLE 1 MG PO TABS: 1.0000 mg | ORAL_TABLET | Freq: Every day | ORAL | Status: DC

## 2015-03-12 ENCOUNTER — Encounter: Payer: Self-pay | Admitting: Podiatry

## 2015-03-12 ENCOUNTER — Other Ambulatory Visit: Payer: Self-pay | Admitting: *Deleted

## 2015-03-12 ENCOUNTER — Ambulatory Visit (INDEPENDENT_AMBULATORY_CARE_PROVIDER_SITE_OTHER): Payer: Medicare Other

## 2015-03-12 ENCOUNTER — Ambulatory Visit (INDEPENDENT_AMBULATORY_CARE_PROVIDER_SITE_OTHER): Payer: Medicare Other | Admitting: Podiatry

## 2015-03-12 VITALS — BP 140/75 | HR 82 | Resp 16

## 2015-03-12 DIAGNOSIS — M79672 Pain in left foot: Secondary | ICD-10-CM | POA: Diagnosis not present

## 2015-03-12 DIAGNOSIS — M779 Enthesopathy, unspecified: Secondary | ICD-10-CM | POA: Diagnosis not present

## 2015-03-12 MED ORDER — TRIAMCINOLONE ACETONIDE 10 MG/ML IJ SUSP
10.0000 mg | Freq: Once | INTRAMUSCULAR | Status: AC
  Administered 2015-03-12: 10 mg

## 2015-03-12 MED ORDER — ENALAPRIL MALEATE 20 MG PO TABS: 20.0000 mg | ORAL_TABLET | Freq: Two times a day (BID) | ORAL | Status: DC

## 2015-03-12 NOTE — Progress Notes (Signed)
Subjective:     Patient ID: Erin Esparza, female   DOB: [birth year], 74 y.o.   MRN: [id number]  HPI patient presents stating I started develop pain in my left ankle and it feels somewhat like my right but I'm notch or the different   Review of Systems     Objective:   Physical Exam Neurovascular status intact muscle strength adequate range of motion within normal limits with patient having exquisite discomfort in the left sinus tarsi with fluid buildup and moderate edema noted.    Assessment:     Acute sinus tarsitis left with inflammation and fluid buildup    Plan:     H&P and x-rays reviewed. I then went ahead and injected the sinus tarsi left 3 mg Kenalog 5 mg Xylocaine which was tolerated well and patient be seen back if symptoms persist

## 2015-04-29 ENCOUNTER — Emergency Department (HOSPITAL_COMMUNITY)
Admission: EM | Admit: 2015-04-29 | Discharge: 2015-04-29 | Disposition: A | Discharge status: Home / Self Care | Payer: Medicare Other | Attending: Emergency Medicine | Admitting: Emergency Medicine

## 2015-04-29 ENCOUNTER — Encounter (HOSPITAL_COMMUNITY): Payer: Self-pay | Admitting: Emergency Medicine

## 2015-04-29 DIAGNOSIS — E669 Obesity, unspecified: Secondary | ICD-10-CM | POA: Diagnosis not present

## 2015-04-29 DIAGNOSIS — Z79899 Other long term (current) drug therapy: Secondary | ICD-10-CM | POA: Insufficient documentation

## 2015-04-29 DIAGNOSIS — Z923 Personal history of irradiation: Secondary | ICD-10-CM | POA: Diagnosis not present

## 2015-04-29 DIAGNOSIS — I10 Essential (primary) hypertension: Secondary | ICD-10-CM | POA: Insufficient documentation

## 2015-04-29 DIAGNOSIS — Z87891 Personal history of nicotine dependence: Secondary | ICD-10-CM | POA: Diagnosis not present

## 2015-04-29 DIAGNOSIS — R197 Diarrhea, unspecified: Secondary | ICD-10-CM | POA: Insufficient documentation

## 2015-04-29 DIAGNOSIS — Z86718 Personal history of other venous thrombosis and embolism: Secondary | ICD-10-CM | POA: Diagnosis not present

## 2015-04-29 DIAGNOSIS — R531 Weakness: Secondary | ICD-10-CM | POA: Diagnosis not present

## 2015-04-29 DIAGNOSIS — Z98811 Dental restoration status: Secondary | ICD-10-CM | POA: Diagnosis not present

## 2015-04-29 DIAGNOSIS — Z86711 Personal history of pulmonary embolism: Secondary | ICD-10-CM | POA: Diagnosis not present

## 2015-04-29 DIAGNOSIS — Z8739 Personal history of other diseases of the musculoskeletal system and connective tissue: Secondary | ICD-10-CM | POA: Diagnosis not present

## 2015-04-29 DIAGNOSIS — Z7951 Long term (current) use of inhaled steroids: Secondary | ICD-10-CM | POA: Diagnosis not present

## 2015-04-29 DIAGNOSIS — Z853 Personal history of malignant neoplasm of breast: Secondary | ICD-10-CM | POA: Insufficient documentation

## 2015-04-29 DIAGNOSIS — Z8742 Personal history of other diseases of the female genital tract: Secondary | ICD-10-CM | POA: Diagnosis not present

## 2015-04-29 DIAGNOSIS — J45909 Unspecified asthma, uncomplicated: Secondary | ICD-10-CM | POA: Insufficient documentation

## 2015-04-29 DIAGNOSIS — I5030 Unspecified diastolic (congestive) heart failure: Secondary | ICD-10-CM | POA: Diagnosis not present

## 2015-04-29 DIAGNOSIS — Z7901 Long term (current) use of anticoagulants: Secondary | ICD-10-CM | POA: Diagnosis not present

## 2015-04-29 LAB — BASIC METABOLIC PANEL
Anion gap: 8 (ref 5–15)
BUN: 15 mg/dL (ref 6–20)
CO2: 28 mmol/L (ref 22–32)
Calcium: 9.4 mg/dL (ref 8.9–10.3)
Chloride: 103 mmol/L (ref 101–111)
Creatinine, Ser: 0.86 mg/dL (ref 0.44–1.00)
GFR calc Af Amer: >60 mL/min (ref >60)
GFR calc non Af Amer: >60 mL/min (ref >60)
Glucose, Bld: 109 mg/dL — ABNORMAL HIGH (ref 65–99)
Potassium: 4 mmol/L (ref 3.5–5.1)
Sodium: 139 mmol/L (ref 135–145)

## 2015-04-29 LAB — CBC
HCT: 34.9 % — ABNORMAL LOW (ref 36.0–46.0)
Hemoglobin: 11.3 g/dL — ABNORMAL LOW (ref 12.0–15.0)
MCH: 30.1 pg (ref 26.0–34.0)
MCHC: 32.4 g/dL (ref 30.0–36.0)
MCV: 93.1 fL (ref 78.0–100.0)
Platelets: 269 10*3/uL (ref 150–400)
RBC: 3.75 MIL/uL — ABNORMAL LOW (ref 3.87–5.11)
RDW: 13.4 % (ref 11.5–15.5)
WBC: 7 10*3/uL (ref 4.0–10.5)

## 2015-04-29 LAB — URINALYSIS, ROUTINE W REFLEX MICROSCOPIC
Bilirubin Urine: NEGATIVE
Glucose, UA: NEGATIVE mg/dL
Hgb urine dipstick: NEGATIVE
Ketones, ur: NEGATIVE mg/dL
Leukocytes, UA: NEGATIVE
Nitrite: NEGATIVE
Protein, ur: NEGATIVE mg/dL
Specific Gravity, Urine: 1.009 (ref 1.005–1.030)
pH: 6.5 (ref 5.0–8.0)

## 2015-04-29 LAB — CBG MONITORING, ED: Glucose-Capillary: 87 mg/dL (ref 65–99)

## 2015-04-29 MED ORDER — SODIUM CHLORIDE 0.9 % IV BOLUS (SEPSIS): 500.0000 mL | Freq: Once | INTRAVENOUS | Status: DC

## 2015-04-29 NOTE — Discharge Instructions (Signed)

## 2015-04-29 NOTE — ED Notes (Addendum)
Pt states she had a bout of weakness and a hot flash last night that passed. Woke up this morning feeling weak and continues to feel weak. "Like something is draining me." Denies pain, SOB, N/V/D, fever. States she has not fallen, but is on Xarelto

## 2015-04-29 NOTE — ED Provider Notes (Signed)
CSN: [id number]     Arrival date & time 04/29/15  1212 History   First MD Initiated Contact with Patient 04/29/15 1340     Chief Complaint  Patient presents with  . Weakness     (Consider location/radiation/quality/duration/timing/severity/associated sxs/prior Treatment) HPI  74 year old female presents with weakness. Started last night was transient and then returned this morning. He states it feels like her arms and legs don't have the strength a normally do. Patient states she is able to get up and walk but seems to tire out easier than typical. Denies any focal weakness or numbness or unilateral weakness. She states it is not a fatigue. There is no dizziness or lightheadedness. Denies headaches, chest pain, blurry vision, short of breath, or abdominal pain. She has a history of DVT and PE but states this is nothing like that. No new leg swelling. No urinary symptoms. She states she has had 3 episodes of diarrhea today.  Past Medical History  Diagnosis Date  . Pulmonary embolism ([place])   . DVT (deep venous thrombosis) ([place])   . Hypertension   . Venous stasis of lower extremity   . Asthma   . Right ankle pain   . Insulin resistance   . Clotting disorder ([place])   . Breast mass in female   . Shortness of breath   . Wears glasses   . Wears dentures     upper  . Wears partial dentures     lower  . Arthritis   . Diastolic congestive heart failure ([place])   . Breast cancer ([place]) 01/06/12    left breast invasive ductal ca,dcis,ER/PR=+,  . Radiation 02/12/12 -03/12/12    left breast, total 50gy   Past Surgical History  Procedure Laterality Date  . No past surgeries    . Breast biopsy  11/03/11    left breast bx 10 o'clock=benign breast parenchyma  . Breast lumpectomy  01/06/12    Left breast/ Dr. Erroll Luna   Family History  Problem Relation Age of Onset  . Heart disease Mother   . Cancer Sister     breast   Social History  Substance Use Topics  . Smoking status: Former  Smoker -- 0.30 packs/day for 15 years    Types: Cigarettes    Quit date: 06/02/1997  . Smokeless tobacco: Never Used  . Alcohol Use: No   OB History    No data available     Review of Systems  Respiratory: Negative for shortness of breath.   Cardiovascular: Negative for chest pain.  Gastrointestinal: Positive for diarrhea. Negative for vomiting and abdominal pain.  Genitourinary: Negative for dysuria.  Neurological: Positive for weakness. Negative for dizziness, light-headedness, numbness and headaches.  All other systems reviewed and are negative.     Allergies  Review of patient's allergies indicates no known allergies.  Home Medications   Prior to Admission medications   Medication Sig Start Date End Date Taking? Authorizing Provider  acetaminophen (TYLENOL) 500 MG tablet Take 1 tablet (500 mg total) by mouth every 6 (six) hours as needed. Patient taking differently: Take 500 mg by mouth every 6 (six) hours as needed for moderate pain.  08/25/12  Yes Jayce G Cook, DO  ADVAIR DISKUS 100-50 MCG/DOSE AEPB INHALE 1 PUFF BY MOUTH TWICE DAILY AS DIRECTED 10/06/14  Yes Coral Spikes, DO  anastrozole (ARIMIDEX) 1 MG tablet Take 1 tablet (1 mg total) by mouth daily. 03/09/15  Yes Nicholas Lose, MD  Calcium Carbonate (CALTRATE 600 PO)  Take 1 tablet by mouth 2 (two) times daily.    Yes Historical Provider, MD  enalapril (VASOTEC) 20 MG tablet Take 1 tablet (20 mg total) by mouth 2 (two) times daily. 03/12/15  Yes Virginia Crews, MD  furosemide (LASIX) 40 MG tablet Take 1 tablet (40 mg total) by mouth daily as needed (For leg swelling.). 10/04/13  Yes Coral Spikes, DO  hydrochlorothiazide (HYDRODIURIL) 25 MG tablet Take 1 tablet (25 mg total) by mouth daily. 02/19/15  Yes Virginia Crews, MD  Multiple Vitamin (MULTIVITAMIN) tablet Take 1 tablet by mouth daily.   Yes Historical Provider, MD  Multiple Vitamins-Minerals (ICAPS AREDS 2 PO) Take 1 capsule by mouth 2 (two) times daily.   Yes  Historical Provider, MD  Multiple Vitamins-Minerals (WOMENS BONE HEALTH PO) Take 1 tablet by mouth daily.   Yes Historical Provider, MD  rivaroxaban (XARELTO) 20 MG TABS tablet Take 1 tablet (20 mg total) by mouth daily. 10/09/14  Yes Jayce G Cook, DO   BP 159/73 mmHg  Temp(Src) 98 F (36.7 C) (Oral)  Resp 18  SpO2 99% Physical Exam  Constitutional: She is oriented to person, place, and time. She appears well-developed and well-nourished.  obese  HENT:  Head: Normocephalic and atraumatic.  Right Ear: External ear normal.  Left Ear: External ear normal.  Nose: Nose normal.  Eyes: EOM are normal. Pupils are equal, round, and reactive to light. Right eye exhibits no discharge. Left eye exhibits no discharge.  Neck: Neck supple.  Cardiovascular: Normal rate, regular rhythm and normal heart sounds.   No murmur heard. Pulmonary/Chest: Effort normal and breath sounds normal.  Abdominal: Soft. She exhibits no distension. There is no tenderness.  Neurological: She is alert and oriented to person, place, and time.  CN 2-12 grossly intact. 5/5 strength in all 4 extremities. Grossly normal sensation  Skin: Skin is warm and dry.  Nursing note and vitals reviewed.   ED Course  Procedures (including critical care time) Labs Review Labs Reviewed  BASIC METABOLIC PANEL - Abnormal; Notable for the following:    Glucose, Bld 109 (*)    All other components within normal limits  CBC - Abnormal; Notable for the following:    RBC 3.75 (*)    Hemoglobin 11.3 (*)    HCT 34.9 (*)    All other components within normal limits  URINALYSIS, ROUTINE W REFLEX MICROSCOPIC (NOT AT [facility])  CBG MONITORING, ED    Imaging Review No results found. I have personally reviewed and evaluated these images and lab results as part of my medical decision-making.   EKG Interpretation   Date/Time:  Sunday April 29 2015 12:29:17 EST Ventricular Rate:  71 PR Interval:  164 QRS Duration: 123 QT Interval:   422 QTC Calculation: 459 R Axis:   -31 Text Interpretation:  Sinus arrhythmia Consider left atrial enlargement  Left ventricular hypertrophy Anterior Q waves, possibly due to LVH no  significant change since 2013 Confirmed by GOLDSTON  MD, [place] ([id number]) on  04/29/2015 1:41:14 PM      MDM   Final diagnoses:  Generalized weakness    Patient has a normal neurologic exam and no focal weakness noted. Vital signs are unremarkable signs moderate hypertension. No signs of a stroke. With no headache or focal weakness seen no indication for CT of the head. No lung or cardiac symptoms. Her strength is exiting normal on my examination and related to the bathroom without difficulty. Patient thinks she "overdid it" during this  holiday. At this point there is appears to be no emergent pathology. I recommended she follow-up closely with her PCP and return if any symptoms were to worsen.    Sherwood Gambler, MD 04/29/15 1539

## 2015-05-02 ENCOUNTER — Ambulatory Visit (INDEPENDENT_AMBULATORY_CARE_PROVIDER_SITE_OTHER): Payer: Medicare Other | Admitting: Family Medicine

## 2015-05-02 ENCOUNTER — Encounter: Payer: Self-pay | Admitting: Family Medicine

## 2015-05-02 VITALS — BP 140/80 | HR 80 | Temp 98.2°F | Ht 67.0 in | Wt 293.0 lb

## 2015-05-02 DIAGNOSIS — E860 Dehydration: Secondary | ICD-10-CM | POA: Diagnosis not present

## 2015-05-02 DIAGNOSIS — R197 Diarrhea, unspecified: Secondary | ICD-10-CM

## 2015-05-02 NOTE — Patient Instructions (Signed)
Thank you for coming in today!  I think your weakness was most likely from dehydration from the diarrhea. If you continue to feel this way now after it has gotten better please let us know.   Our clinic's number is [phone]. Feel free to call any time with questions or concerns. We will answer any questions after hours with our 24-hour emergency line at that number as well.   - Dr. Bonner Puna

## 2015-05-02 NOTE — Progress Notes (Signed)
Subjective: Erin Esparza is a 74 y.o. female presenting for ED follow up of weakness which has since resolved.  She reports approximately 5 days of general weakness in all her muscles without myalgias, arthralgias or rash. She describes 5 days of waxing/waning, intermittent generalized weakness in all of her muscles occasionally associated with a nondescript warm feeling over her whole body that lasts 1 - 2 minutes. She endorses good oral intake but 3 episodes of loose stools 3 days ago and again yesterday but feeling a lot better since she made this appointment. She is currently not experiencing any symptoms.    No fever, abdominal pain, or other pain, CP, SOB, palpitations, vision or hearing changes, myalgias, arthralgias, rashes. Denies sensation of illusory movement. She thinks she might have over-exerted herself with continuous cooking over the holidays. No sick contacts.   - Former smoker - History of PE; no new leg swelling/pain or dyspnea.  Objective: BP 140/80 mmHg  Pulse 80  Temp(Src) 98.2 F (36.8 C) (Oral)  Ht 5\' 7"  (1.702 m)  Wt 293 lb (132.904 kg)  BMI 45.88 kg/m2  SpO2 96% Gen: Pleasant 74 y.o. female in no distress Pulm: Non-labored breathing ambient air; CTAB, no wheezes or crackles CV: Regular rate, no murmur; radial, DP and PT pulses 2+ bilaterally; no LE edema, no JVD, cap refill < 2 sec. GI: Hyperactive BS; soft, non-tender, non-distended, no organomegaly, no hernia appreciated Neurologic Exam: Alert and oriented x4, CN II-XII without deficits, no focal deficits in sensation or motor function, patellar DTRs 2+ bilaterally. No dysmetria or dysdiadochokinesia. Fluent speech without aphasia. Gait slow. No pronator drift.   Assessment/Plan: Erin Esparza is a 74 y.o. female here for weakness presumably due to dehydration secondary to mild-diarrhea. This is now resolved. Doubt PE with normal VS's, no dyspnea/chest pain. Could consider work up for fatigue if this becomes  chronic but it is almost certainly resolved.

## 2015-06-07 ENCOUNTER — Other Ambulatory Visit: Payer: Self-pay | Admitting: *Deleted

## 2015-06-07 MED ORDER — FLUTICASONE-SALMETEROL 100-50 MCG/DOSE IN AEPB: INHALATION_SPRAY | RESPIRATORY_TRACT | Status: DC

## 2015-07-23 DIAGNOSIS — H40033 Anatomical narrow angle, bilateral: Secondary | ICD-10-CM | POA: Diagnosis not present

## 2015-07-23 DIAGNOSIS — H25813 Combined forms of age-related cataract, bilateral: Secondary | ICD-10-CM | POA: Diagnosis not present

## 2015-07-23 DIAGNOSIS — H40013 Open angle with borderline findings, low risk, bilateral: Secondary | ICD-10-CM | POA: Diagnosis not present

## 2015-09-08 ENCOUNTER — Other Ambulatory Visit: Payer: Self-pay | Admitting: Family Medicine

## 2015-09-24 DIAGNOSIS — H18413 Arcus senilis, bilateral: Secondary | ICD-10-CM | POA: Diagnosis not present

## 2015-09-24 DIAGNOSIS — H2513 Age-related nuclear cataract, bilateral: Secondary | ICD-10-CM | POA: Diagnosis not present

## 2015-09-24 DIAGNOSIS — H353131 Nonexudative age-related macular degeneration, bilateral, early dry stage: Secondary | ICD-10-CM | POA: Diagnosis not present

## 2015-09-24 DIAGNOSIS — H40023 Open angle with borderline findings, high risk, bilateral: Secondary | ICD-10-CM | POA: Diagnosis not present

## 2015-09-24 DIAGNOSIS — H40033 Anatomical narrow angle, bilateral: Secondary | ICD-10-CM | POA: Diagnosis not present

## 2015-09-25 ENCOUNTER — Other Ambulatory Visit: Payer: Self-pay | Admitting: *Deleted

## 2015-09-25 MED ORDER — RIVAROXABAN 20 MG PO TABS: 20.0000 mg | ORAL_TABLET | Freq: Every day | ORAL | Status: DC

## 2015-10-08 ENCOUNTER — Other Ambulatory Visit: Payer: Self-pay | Admitting: Nurse Practitioner

## 2015-10-08 DIAGNOSIS — Z853 Personal history of malignant neoplasm of breast: Secondary | ICD-10-CM

## 2015-11-14 ENCOUNTER — Ambulatory Visit
Admission: RE | Admit: 2015-11-14 | Discharge: 2015-11-14 | Disposition: A | Discharge status: Home / Self Care | Payer: Medicare Other | Source: Ambulatory Visit | Attending: Nurse Practitioner | Admitting: Nurse Practitioner

## 2015-11-14 DIAGNOSIS — R928 Other abnormal and inconclusive findings on diagnostic imaging of breast: Secondary | ICD-10-CM | POA: Diagnosis not present

## 2015-11-14 DIAGNOSIS — Z853 Personal history of malignant neoplasm of breast: Secondary | ICD-10-CM

## 2015-12-03 DIAGNOSIS — Z853 Personal history of malignant neoplasm of breast: Secondary | ICD-10-CM | POA: Diagnosis not present

## 2015-12-27 ENCOUNTER — Ambulatory Visit (HOSPITAL_BASED_OUTPATIENT_CLINIC_OR_DEPARTMENT_OTHER): Payer: Medicare Other | Admitting: Hematology and Oncology

## 2015-12-27 ENCOUNTER — Telehealth: Payer: Self-pay | Admitting: Hematology and Oncology

## 2015-12-27 ENCOUNTER — Encounter: Payer: Self-pay | Admitting: Hematology and Oncology

## 2015-12-27 DIAGNOSIS — Z79811 Long term (current) use of aromatase inhibitors: Secondary | ICD-10-CM | POA: Diagnosis not present

## 2015-12-27 DIAGNOSIS — Z7901 Long term (current) use of anticoagulants: Secondary | ICD-10-CM

## 2015-12-27 DIAGNOSIS — Z170 Estrogen receptor positive status [ER+]: Secondary | ICD-10-CM | POA: Diagnosis not present

## 2015-12-27 DIAGNOSIS — C50212 Malignant neoplasm of upper-inner quadrant of left female breast: Secondary | ICD-10-CM

## 2015-12-27 DIAGNOSIS — Z86718 Personal history of other venous thrombosis and embolism: Secondary | ICD-10-CM | POA: Diagnosis not present

## 2015-12-27 NOTE — Progress Notes (Signed)
Patient Care Team: Virginia Crews, MD as PCP - General (Family Medicine) Consuela Mimes, MD (Inactive) (Oncology)  DIAGNOSIS: Primary cancer of upper inner quadrant of left female breast [facility])   Staging form: Breast, AJCC 7th Edition   - Clinical: No stage assigned - Unsigned   - Pathologic: Stage IA (T77mc, N0, cM0) - Signed by VRulon Eisenmenger MD on 01/24/2014  SUMMARY OF ONCOLOGIC HISTORY:   [place]   10/23/2011 Mammogram    Suspicious mass at 10:00 position 7 cm from the left nipple. Ultrasound irregular hypoechoic mass 7 x 6 x 5 mm     01/06/2012 Initial Biopsy    Initial biopsy was benign . Dr. 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 She is tolerating Arimidex extremely well without any major problems or concerns. She denies any hot flashes or myalgias.  REVIEW OF SYSTEMS:   Constitutional: Denies fevers, chills or abnormal weight loss Eyes: Denies blurriness of vision Ears, nose, mouth, throat, and face: Denies mucositis or sore throat Respiratory: Denies cough, dyspnea or wheezes Cardiovascular: Denies palpitation, chest discomfort Gastrointestinal:  Denies nausea, heartburn or change in bowel habits Skin: Denies abnormal skin  rashes Lymphatics: Denies new lymphadenopathy or easy bruising Neurological:Denies numbness, tingling or new weaknesses Behavioral/Psych: Mood is stable, no new changes  Extremities: No lower extremity edema Breast:  denies any pain or lumps or nodules in either breasts All other systems were reviewed with the patient and are negative.  I have reviewed the past medical history, past surgical history, social history and family history with the patient and they are unchanged from previous note.  ALLERGIES:  has No Known Allergies.  MEDICATIONS:  Current Outpatient Prescriptions  Medication Sig Dispense Refill  . acetaminophen (TYLENOL) 500 MG tablet Take 1 tablet (500 mg total) by mouth every 6 (six) hours as needed. (Patient taking differently: Take 500 mg by mouth every 6 (six) hours as needed for moderate pain. ) 30 tablet 3  . anastrozole (ARIMIDEX) 1 MG tablet Take 1 tablet (1 mg total) by mouth daily. 90 tablet 3  . Calcium Carbonate (CALTRATE 600 PO) Take 1 tablet by mouth 2 (two) times daily.     . enalapril (VASOTEC) 20 MG tablet TAKE 1 TABLET BY MOUTH TWICE DAILY 60 tablet 2  . Fluticasone-Salmeterol (ADVAIR DISKUS) 100-50 MCG/DOSE AEPB INHALE 1 PUFF BY MOUTH TWICE DAILY AS DIRECTED 60 each 3  . furosemide (LASIX) 40 MG tablet Take 1 tablet (40 mg total) by mouth daily as needed (For leg swelling.). 30 tablet 3  . hydrochlorothiazide (HYDRODIURIL) 25 MG tablet Take 1 tablet (25 mg total) by mouth daily. 90 tablet 3  . Multiple Vitamin (  MULTIVITAMIN) tablet Take 1 tablet by mouth daily.    . Multiple Vitamins-Minerals (ICAPS AREDS 2 PO) Take 1 capsule by mouth 2 (two) times daily.    . Multiple Vitamins-Minerals (WOMENS BONE HEALTH PO) Take 1 tablet by mouth daily.    . rivaroxaban (XARELTO) 20 MG TABS tablet Take 1 tablet (20 mg total) by mouth daily. 90 tablet 3   No current facility-administered medications for this visit.     PHYSICAL EXAMINATION: ECOG PERFORMANCE STATUS: 1  - Symptomatic but completely ambulatory  Vitals:   12/27/15 1209  BP: 136/69  Pulse: 85  Resp: 19  Temp: 98.5 F (36.9 C)   Filed Weights   12/27/15 1209  Weight: 295 lb 8 oz (134 kg)    GENERAL:alert, no distress and comfortable SKIN: skin color, texture, turgor are normal, no rashes or significant lesions EYES: normal, Conjunctiva are pink and non-injected, sclera clear OROPHARYNX:no exudate, no erythema and lips, buccal mucosa, and tongue normal  NECK: supple, thyroid normal size, non-tender, without nodularity LYMPH:  no palpable lymphadenopathy in the cervical, axillary or inguinal LUNGS: clear to auscultation and percussion with normal breathing effort HEART: regular rate & rhythm and no murmurs and no lower extremity edema ABDOMEN:abdomen soft, non-tender and normal bowel sounds MUSCULOSKELETAL:no cyanosis of digits and no clubbing  NEURO: alert & oriented x 3 with fluent speech, no focal motor/sensory deficits EXTREMITIES: No lower extremity edema BREAST: No palpable masses or nodules in either right or left breasts. No palpable axillary supraclavicular or infraclavicular adenopathy no breast tenderness or nipple discharge. (exam performed in the presence of a chaperone)  LABORATORY DATA:  I have reviewed the data as listed   Chemistry      Component Value Date/Time   NA 139 04/29/2015 1258   NA 142 12/25/2014 1122   K 4.0 04/29/2015 1258   K 4.1 12/25/2014 1122   CL 103 04/29/2015 1258   CL 102 10/04/2012 1159   CO2 28 04/29/2015 1258   CO2 29 12/25/2014 1122   BUN 15 04/29/2015 1258   BUN 13.7 12/25/2014 1122   CREATININE 0.86 04/29/2015 1258   CREATININE 0.9 12/25/2014 1122      Component Value Date/Time   CALCIUM 9.4 04/29/2015 1258   CALCIUM 10.1 12/25/2014 1122   ALKPHOS 99 12/25/2014 1122   AST 21 12/25/2014 1122   ALT 17 12/25/2014 1122   BILITOT 0.41 12/25/2014 1122       Lab Results  Component Value Date   WBC 7.0 04/29/2015   HGB 11.3  (L) 04/29/2015   HCT 34.9 (L) 04/29/2015   MCV 93.1 04/29/2015   PLT 269 04/29/2015   NEUTROABS 5.0 12/25/2014     ASSESSMENT & PLAN:  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 (DVT and PE diagnosed in 2012, currently on xarelto)  Arimidex toxicities: No major problems or concerns We discussed the role of extended adjuvant therapy. Because she has only microscopic invasive breast cancer I did not recommend continuing beyond 5 years. Patient appeared to be nervous about stopping Arimidex therapy in November 2018. We will discuss this once again with her next year when she comes back to see [place].  DVT and PE: takes xarelto  Breast Cancer Surveillance: 1. Breast exam 12/27/2015: Normal 2. Mammogram  11/14/2015 No abnormalities. Postsurgical changes. Breast Density  Category B. I recommended that she get 3-D mammograms for surveillance. Discussed the differences between different breast density categories. 3. Bone density 11/14/2014: Normal T score 1.8  Return to clinic once a year for follow-up   No orders of the defined types were placed in this encounter.  The patient has a good understanding of the overall plan. she agrees with it. she will call with any problems that may develop before the next visit here.   Rulon Eisenmenger, MD 12/27/15

## 2015-12-27 NOTE — Telephone Encounter (Signed)
appt made and avs printed °

## 2015-12-27 NOTE — Assessment & Plan Note (Signed)
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 (DVT and PE diagnosed in 2012, currently on xarelto)  Arimidex toxicities: No major problems or concerns DVT and PE: takes xarelto  Breast Cancer Surveillance: 1. Breast exam 12/27/2015: Normal 2. Mammogram  11/14/2015 No abnormalities. Postsurgical changes. Breast Density Category B. I recommended that she get 3-D mammograms for surveillance. Discussed the differences between different breast density categories. 3. Bone density 11/14/2014: Normal T score 1.8  Return to clinic once a year for follow-up

## 2015-12-29 ENCOUNTER — Other Ambulatory Visit: Payer: Self-pay | Admitting: Family Medicine

## 2016-01-30 ENCOUNTER — Other Ambulatory Visit: Payer: Self-pay | Admitting: Family Medicine

## 2016-01-30 ENCOUNTER — Other Ambulatory Visit: Payer: Self-pay | Admitting: Internal Medicine

## 2016-02-07 ENCOUNTER — Ambulatory Visit (INDEPENDENT_AMBULATORY_CARE_PROVIDER_SITE_OTHER): Payer: Medicare Other

## 2016-02-07 ENCOUNTER — Encounter: Payer: Self-pay | Admitting: Podiatry

## 2016-02-07 ENCOUNTER — Ambulatory Visit (INDEPENDENT_AMBULATORY_CARE_PROVIDER_SITE_OTHER): Payer: Medicare Other | Admitting: Podiatry

## 2016-02-07 DIAGNOSIS — M779 Enthesopathy, unspecified: Secondary | ICD-10-CM | POA: Diagnosis not present

## 2016-02-07 DIAGNOSIS — M7752 Other enthesopathy of left foot: Secondary | ICD-10-CM

## 2016-02-07 DIAGNOSIS — M79672 Pain in left foot: Secondary | ICD-10-CM

## 2016-02-07 DIAGNOSIS — M79671 Pain in right foot: Secondary | ICD-10-CM

## 2016-02-07 DIAGNOSIS — M25572 Pain in left ankle and joints of left foot: Secondary | ICD-10-CM

## 2016-02-07 MED ORDER — TRIAMCINOLONE ACETONIDE 10 MG/ML IJ SUSP
10.0000 mg | Freq: Once | INTRAMUSCULAR | Status: AC
  Administered 2016-02-07: 10 mg

## 2016-02-10 NOTE — Progress Notes (Signed)
Subjective:     Patient ID: Erin Esparza, female   DOB: 16-Jun-1940, 75 y.o.   MRN: [id number]  HPI patient states that he gave me a shot last year which helped me but now it's back again and I felt almost like my left foot is broken   Review of Systems     Objective:   Physical Exam Neurovascular status intact muscle strength adequate with inflammation and pain of the left sinus tarsi with fluid buildup noted and discomfort when palpate    Assessment:     Sinus tarsitis left with inflammatory changes with possibility for increasing arthritis or other pathology    Plan:     H&P x-rays reviewed and injected the sinus tarsi left 3 mg Kenalog 5 mg Xylocaine advised on physical therapy and reviewed reduced activity. Reappoint to recheck  X-rays indicated arthritis but no indications of stress fracture or advanced arthritis condition

## 2016-02-27 ENCOUNTER — Encounter: Payer: Self-pay | Admitting: Family Medicine

## 2016-02-27 ENCOUNTER — Ambulatory Visit (INDEPENDENT_AMBULATORY_CARE_PROVIDER_SITE_OTHER): Payer: Medicare Other | Admitting: Family Medicine

## 2016-02-27 VITALS — BP 120/60 | HR 82 | Temp 98.5°F | Wt 295.0 lb

## 2016-02-27 DIAGNOSIS — Z23 Encounter for immunization: Secondary | ICD-10-CM | POA: Diagnosis not present

## 2016-02-27 DIAGNOSIS — E669 Obesity, unspecified: Secondary | ICD-10-CM

## 2016-02-27 DIAGNOSIS — R7303 Prediabetes: Secondary | ICD-10-CM

## 2016-02-27 DIAGNOSIS — Z0000 Encounter for general adult medical examination without abnormal findings: Secondary | ICD-10-CM | POA: Diagnosis not present

## 2016-02-27 DIAGNOSIS — I10 Essential (primary) hypertension: Secondary | ICD-10-CM | POA: Diagnosis not present

## 2016-02-27 LAB — COMPLETE METABOLIC PANEL WITH GFR
ALT: 10 U/L (ref 6–29)
AST: 16 U/L (ref 10–35)
Albumin: 3.5 g/dL — ABNORMAL LOW (ref 3.6–5.1)
Alkaline Phosphatase: 71 U/L (ref 33–130)
BUN: 16 mg/dL (ref 7–25)
CO2: 27 mmol/L (ref 20–31)
Calcium: 9.6 mg/dL (ref 8.6–10.4)
Chloride: 103 mmol/L (ref 98–110)
Creat: 0.96 mg/dL — ABNORMAL HIGH (ref 0.60–0.93)
GFR, Est African American: 66 mL/min (ref >=60)
GFR, Est Non African American: 58 mL/min — ABNORMAL LOW (ref >=60)
Glucose, Bld: 92 mg/dL (ref 65–99)
Potassium: 4 mmol/L (ref 3.5–5.3)
Sodium: 140 mmol/L (ref 135–146)
Total Bilirubin: 0.3 mg/dL (ref 0.2–1.2)
Total Protein: 7.1 g/dL (ref 6.1–8.1)

## 2016-02-27 LAB — LIPID PANEL
Cholesterol: 157 mg/dL (ref 125–200)
HDL: 47 mg/dL (ref >=46)
LDL Cholesterol: 90 mg/dL (ref <130)
Total CHOL/HDL Ratio: 3.3 Ratio (ref <=5.0)
Triglycerides: 101 mg/dL (ref <150)
VLDL: 20 mg/dL (ref <30)

## 2016-02-27 LAB — POCT GLYCOSYLATED HEMOGLOBIN (HGB A1C): Hemoglobin A1C: 5.8

## 2016-02-27 MED ORDER — TETANUS-DIPHTH-ACELL PERTUSSIS 5-2.5-18.5 LF-MCG/0.5 IM SUSP: 0.5000 mL | Freq: Once | INTRAMUSCULAR | 0 refills | Status: AC

## 2016-02-27 NOTE — Assessment & Plan Note (Signed)
Well-controlled Continue current medications Check CMP today

## 2016-02-27 NOTE — Assessment & Plan Note (Signed)
-  Counseled on diet and exercise.

## 2016-02-27 NOTE — Progress Notes (Signed)
   Subjective:   Erin Esparza is a 75 y.o. female with a history of DCIS, HTN, chronic stasis dermatitis, OA here for well woman/preventative visit  Acute Concerns:   HTN: - Medications: enalipril 20mg  daily, HCTZ 25mg  daily - Compliance: good - Checking BP at home: no - Denies any SOB, CP, vision changes, LE edema, medication SEs, or symptoms of hypotension    Diet: "pretty good" - not restrictive in diet - avoiding a lot of fried foods  [facility] - water aerobics 2x/wk  Sexual/Birth History: G5P5005, not currently sexually active  Birth Control: postmenopausal  POA/Living Will: does not have   Review of Systems: Per HPI.   PMH, PSH, Medications, Allergies, and FmHx reviewed and updated in EMR.  Social History: former smoker  Immunization:  Tdap/TD: needs - Rx given  Influenza: given today  Pneumococcal: 2015  Herpes Zoster: 2012  Cancer Screening:  Pap Smear: n/a  Mammogram: 12/2015 - normal  Colonoscopy: n/a  Dexa: 2016 - normal   Objective:  BP 120/60   Pulse 82   Temp 98.5 F (36.9 C) (Oral)   Wt 295 lb (133.8 kg)   BMI 46.20 kg/m   Gen:  75 y.o. female in NAD, morbidly obese HEENT: NCAT, MMM, EOMI, PERRL, anicteric sclerae, OP clear, TMs clear b/l CV: RRR, no MRG Resp: Non-labored, CTAB, no wheezes noted Abd: Soft, NTND, BS present, no guarding or organomegaly Ext: WWP, 1+ edema, Stasis dermatitis bilateral lower extremities MSK: Gait intact, no obvious deformities Neuro: Alert and oriented, speech normal       Chemistry      Component Value Date/Time   NA 139 04/29/2015 1258   NA 142 12/25/2014 1122   K 4.0 04/29/2015 1258   K 4.1 12/25/2014 1122   CL 103 04/29/2015 1258   CL 102 10/04/2012 1159   CO2 28 04/29/2015 1258   CO2 29 12/25/2014 1122   BUN 15 04/29/2015 1258   BUN 13.7 12/25/2014 1122   CREATININE 0.86 04/29/2015 1258   CREATININE 0.9 12/25/2014 1122      Component Value  Date/Time   CALCIUM 9.4 04/29/2015 1258   CALCIUM 10.1 12/25/2014 1122   ALKPHOS 99 12/25/2014 1122   AST 21 12/25/2014 1122   ALT 17 12/25/2014 1122   BILITOT 0.41 12/25/2014 1122      Lab Results  Component Value Date   WBC 7.0 04/29/2015   HGB 11.3 (L) 04/29/2015   HCT 34.9 (L) 04/29/2015   MCV 93.1 04/29/2015   PLT 269 04/29/2015   Lab Results  Component Value Date   TSH 2.201 03/30/2007   Lab Results  Component Value Date   HGBA1C 5.8 02/27/2016    Assessment & Plan:     Erin Esparza is a 75 y.o. female here for annual well woman/preventative exam  OBESITY Counseled on diet and exercise  HYPERTENSION, BENIGN ESSENTIAL Well-controlled Continue current medications Check CMP today  Prediabetes Check A1c today Counseled on diet and exercise  Preventative health care Up-to-date on screening Followed by oncology for breast cancer Flu shot given today Prescription for TDap Given today Lipids, CMP, A1c today      Virginia Crews, MD MPH PGY-3,  [facility] 02/27/2016  3:51 PM

## 2016-02-27 NOTE — Assessment & Plan Note (Signed)
Check A1c today Counseled on diet and exercise

## 2016-02-27 NOTE — Assessment & Plan Note (Signed)
Up-to-date on screening Followed by oncology for breast cancer Flu shot given today Prescription for TDap Given today Lipids, CMP, A1c today

## 2016-02-27 NOTE — Patient Instructions (Signed)
Things to do to keep yourself healthy  - Exercise at least 30-45 minutes a day, 3-4 days a week.  - Eat a low-fat diet with lots of fruits and vegetables, up to 7-9 servings per day.  - Seatbelts can save your life. Wear them always.  - Smoke detectors on every level of your home, check batteries every year.  - Eye Doctor - have an eye exam every 1-2 years  - Safe sex - if you may be exposed to STDs, use a condom.  - Alcohol -  If you drink, do it moderately, less than 2 drinks per day.  - [place]. Choose someone to speak for you if you are not able.  - Depression is common in our stressful world.If you're feeling down or losing interest in things you normally enjoy, please come in for a visit.  - Violence - If anyone is threatening or hurting you, please call immediately.  We are getting some labs today and someone will call you or send you a letter with the results when they're available. Continue your current medications at this time.  I will see you in 6 months for blood pressure follow-up.  Take care, Dr. Jacinto Reap

## 2016-03-01 ENCOUNTER — Other Ambulatory Visit: Payer: Self-pay | Admitting: Internal Medicine

## 2016-03-03 ENCOUNTER — Other Ambulatory Visit: Payer: Self-pay | Admitting: Hematology and Oncology

## 2016-03-03 DIAGNOSIS — C50212 Malignant neoplasm of upper-inner quadrant of left female breast: Secondary | ICD-10-CM

## 2016-03-03 NOTE — Telephone Encounter (Signed)
Chart reviewed.

## 2016-03-05 ENCOUNTER — Telehealth: Payer: Self-pay | Admitting: Family Medicine

## 2016-03-05 NOTE — Telephone Encounter (Signed)
Called patient to discuss lab results.  Normal CMET.  Lipids normal, but ASCVD 10 yr risk 9.8%.  Discussed with daughter that we could consider statin.  Patient to discuss with her daughter and called back if she wants to start statin.  Also discussed healthy diet for A1c 5.8 (prediabetes).  Virginia Crews, MD, MPH PGY-3,  [facility] 03/05/2016 9:13 AM

## 2016-03-25 DIAGNOSIS — H40033 Anatomical narrow angle, bilateral: Secondary | ICD-10-CM | POA: Diagnosis not present

## 2016-03-25 DIAGNOSIS — H353131 Nonexudative age-related macular degeneration, bilateral, early dry stage: Secondary | ICD-10-CM | POA: Diagnosis not present

## 2016-03-25 DIAGNOSIS — H25013 Cortical age-related cataract, bilateral: Secondary | ICD-10-CM | POA: Diagnosis not present

## 2016-03-25 DIAGNOSIS — H2513 Age-related nuclear cataract, bilateral: Secondary | ICD-10-CM | POA: Diagnosis not present

## 2016-03-25 DIAGNOSIS — H40023 Open angle with borderline findings, high risk, bilateral: Secondary | ICD-10-CM | POA: Diagnosis not present

## 2016-04-22 DIAGNOSIS — H25813 Combined forms of age-related cataract, bilateral: Secondary | ICD-10-CM | POA: Diagnosis not present

## 2016-04-22 DIAGNOSIS — H40013 Open angle with borderline findings, low risk, bilateral: Secondary | ICD-10-CM | POA: Diagnosis not present

## 2016-04-22 DIAGNOSIS — H40033 Anatomical narrow angle, bilateral: Secondary | ICD-10-CM | POA: Diagnosis not present

## 2016-04-22 DIAGNOSIS — H353131 Nonexudative age-related macular degeneration, bilateral, early dry stage: Secondary | ICD-10-CM | POA: Diagnosis not present

## 2016-04-30 ENCOUNTER — Other Ambulatory Visit: Payer: Self-pay | Admitting: Internal Medicine

## 2016-05-06 ENCOUNTER — Other Ambulatory Visit: Payer: Self-pay | Admitting: Family Medicine

## 2016-05-20 ENCOUNTER — Encounter: Payer: Self-pay | Admitting: *Deleted

## 2016-05-20 ENCOUNTER — Ambulatory Visit (INDEPENDENT_AMBULATORY_CARE_PROVIDER_SITE_OTHER): Payer: Medicare Other | Admitting: *Deleted

## 2016-05-20 VITALS — BP 132/56 | HR 79 | Temp 98.2°F | Ht 66.0 in | Wt 294.0 lb

## 2016-05-20 DIAGNOSIS — Z0000 Encounter for general adult medical examination without abnormal findings: Secondary | ICD-10-CM

## 2016-05-20 NOTE — Progress Notes (Signed)
Subjective:   Erin Esparza is a 75 y.o. female who presents for Medicare Annual (Subsequent) preventive examination.  Cardiac Risk Factors include: advanced age (>29men, >43 women);hypertension;obesity (BMI >30kg/m2);smoking/ tobacco exposure     Objective:     Vitals: BP (!) 132/56 (BP Location: Right Leg, Patient Position: Sitting, Cuff Size: Large)   Pulse 79   Temp 98.2 F (36.8 C) (Oral)   Ht 5\' 6"  (1.676 m)   Wt 294 lb (133.4 kg)   SpO2 94%   BMI 47.45 kg/m   Body mass index is 47.45 kg/m.   Tobacco History  Smoking Status  . Former Smoker  . Packs/day: 0.30  . Years: 15.00  . Types: Cigarettes  . Quit date: 06/02/1997  Smokeless Tobacco  . Never Used     Counseling given: Yes Patient is former smoker with no plans to restart   Past Medical History:  Diagnosis Date  . Arthritis   . Asthma   . Breast cancer ([place]) 01/06/12   left breast invasive ductal ca,dcis,ER/PR=+,  . Breast mass in female   . Clotting disorder ([place])   . Diastolic congestive heart failure ([place])   . DVT (deep venous thrombosis) ([place])   . Hypertension   . Insulin resistance   . Pulmonary embolism ([place])   . Radiation 02/12/12 -03/12/12   left breast, total 50gy  . Right ankle pain   . Shortness of breath   . Venous stasis of lower extremity   . Wears dentures    upper  . Wears glasses   . Wears partial dentures    lower   Past Surgical History:  Procedure Laterality Date  . BREAST BIOPSY  11/03/11   left breast bx 10 o'clock=benign breast parenchyma  . BREAST LUMPECTOMY  01/06/12   Left breast/ Dr. Erroll Luna  . NO PAST SURGERIES     Family History  Problem Relation Age of Onset  . Heart disease Mother   . Heart attack Mother   . Cancer Sister     Breast  . Parkinson's disease Brother   . Pulmonary embolism Daughter   . Diabetes Daughter   . Pulmonary embolism Daughter    History  Sexual Activity  . Sexual activity: Not Currently    Outpatient Encounter  Prescriptions as of 05/20/2016  Medication Sig  . acetaminophen (TYLENOL) 500 MG tablet Take 1 tablet (500 mg total) by mouth every 6 (six) hours as needed. (Patient taking differently: Take 500 mg by mouth every 6 (six) hours as needed for moderate pain. )  . ADVAIR DISKUS 100-50 MCG/DOSE AEPB INHALE 1 PUFF BY MOUTH TWICE DAILY AS DIRECTED  . anastrozole (ARIMIDEX) 1 MG tablet TAKE 1 TABLET(1 MG) BY MOUTH DAILY  . Calcium Carbonate (CALTRATE 600 PO) Take 1 tablet by mouth 2 (two) times daily.   . enalapril (VASOTEC) 20 MG tablet TAKE 1 TABLET BY MOUTH TWICE DAILY  . hydrochlorothiazide (HYDRODIURIL) 25 MG tablet TAKE 1 TABLET BY MOUTH DAILY  . Multiple Vitamin (MULTIVITAMIN) tablet Take 1 tablet by mouth daily.  . Multiple Vitamins-Minerals (ICAPS AREDS 2 PO) Take 1 capsule by mouth 2 (two) times daily.  . Multiple Vitamins-Minerals (WOMENS BONE HEALTH PO) Take 1 tablet by mouth daily.  . rivaroxaban (XARELTO) 20 MG TABS tablet Take 1 tablet (20 mg total) by mouth daily.  . [DISCONTINUED] hydrochlorothiazide (HYDRODIURIL) 25 MG tablet TAKE 1 TABLET BY MOUTH DAILY  . furosemide (LASIX) 40 MG tablet Take 1 tablet (40  mg total) by mouth daily as needed (For leg swelling.). (Patient not taking: Reported on 05/20/2016)   No facility-administered encounter medications on file as of 05/20/2016.     Activities of Daily Living In your present state of health, do you have any difficulty performing the following activities: 05/20/2016  Hearing? N  Vision? N  Difficulty concentrating or making decisions? N  Walking or climbing stairs? Y  Dressing or bathing? N  Doing errands, shopping? N  Preparing Food and eating ? N  Using the Toilet? N  In the past six months, have you accidently leaked urine? N  Do you have problems with loss of bowel control? N  Managing your Medications? N  Managing your Finances? N  Housekeeping or managing your Housekeeping? N  Some recent data might be hidden   Home  Safety:  My home has a working smoke alarm:  Yes X 2           My home throw rugs have been fastened down to the floor or removed:  Removed I have non-slip mats in the bathtub and shower:  Yes         All my home's stairs have railings or bannisters: One level home with no outside stairs           My home's floors, stairs and hallways are free from clutter, wires and cords:  Yes     I wear seatbelts consistently:  Yes   Patient Care Team: Virginia Crews, MD as PCP - General (Family Medicine) Marcy Panning, MD (Oncology) [name], MD as Consulting Physician (Ophthalmology) Wallene Huh, DPM as Consulting Physician (Podiatry)    Assessment:     Exercise Activities and Dietary recommendations Current Exercise Habits: Structured exercise class ([place]), Time (Minutes): 45, Frequency (Times/Week): 3, Weekly Exercise (Minutes/Week): 135, Intensity: Mild  Goals    . Blood Pressure < 140/90    . Reduce sodium intake      Fall Risk Fall Risk  05/20/2016 02/27/2016 05/02/2015 01/30/2015 10/04/2013  Falls in the past year? No No No No No   Depression Screen PHQ 2/9 Scores 05/20/2016 02/27/2016 05/02/2015 02/16/2015  PHQ - 2 Score 0 0 0 0    TUG Test:  Done in 14 seconds. Patient used both hands to push out of chair and to sit back down.  Cognitive Function: Mini-Cog  Passed with score 5/5   Cognitive Function MMSE - Mini Mental State Exam 10/04/2013 07/11/2011  Orientation to time 5 5  Orientation to Place 5 5  Registration 3 3  Attention/ Calculation 5 5  Recall 2 2  Language- name 2 objects 2 2  Language- repeat 1 1  Language- follow 3 step command 3 3  Language- read & follow direction 1 1  Write a sentence 1 1  Copy design 1 1  Total score 29 29        Immunization History  Administered Date(s) Administered  . Influenza Split 03/12/2011  . Influenza Whole 03/25/2007, 03/06/2008, 03/08/2009, 03/19/2010  . Influenza,inj,Quad PF,36+ Mos 02/18/2013,  02/17/2014, 02/16/2015, 02/27/2016  . Pneumococcal Conjugate-13 02/17/2014  . Pneumococcal Polysaccharide-23 01/06/2006  . Td 01/06/2006   Screening Tests Health Maintenance  Topic Date Due  . TETANUS/TDAP  01/07/2016  . INFLUENZA VACCINE  Completed  . DEXA SCAN  Completed  . ZOSTAVAX  Completed  . PNA vac Low Risk Adult  Completed    Patient will obtain TDaP at local pharmacy Discussed receiving Shingrix when available  Plan:    During the course of the visit the patient was educated and counseled about the following appropriate screening and preventive services:   Vaccines to include Pneumoccal, Influenza, Td, Zostavax  Cardiovascular Disease  Colorectal cancer screening  Bone density screening  Diabetes screening  Mammography/PAP  Nutrition counseling   Patient Instructions (the written plan) was given to the patient.   Velora Heckler, RN  05/20/2016

## 2016-05-20 NOTE — Patient Instructions (Addendum)
DASH Eating Plan DASH stands for "Dietary Approaches to Stop Hypertension." The DASH eating plan is a healthy eating plan that has been shown to reduce high blood pressure (hypertension). Additional health benefits may include reducing the risk of type 2 diabetes mellitus, heart disease, and stroke. The DASH eating plan may also help with weight loss. What do I need to know about the DASH eating plan? For the DASH eating plan, you will follow these general guidelines:  Choose foods with less than 150 milligrams of sodium per serving (as listed on the food label).  Use salt-free seasonings or herbs instead of table salt or sea salt.  Check with your health care provider or pharmacist before using salt substitutes.  Eat lower-sodium products. These are often labeled as "low-sodium" or "no salt added."  Eat fresh foods. Avoid eating a lot of canned foods.  Eat more vegetables, fruits, and low-fat dairy products.  Choose whole grains. Look for the word "whole" as the first word in the ingredient list.  Choose fish and skinless chicken or [place] more often than red meat. Limit fish, poultry, and meat to 6 oz (170 g) each day.  Limit sweets, desserts, sugars, and sugary drinks.  Choose heart-healthy fats.  Eat more home-cooked food and less restaurant, buffet, and fast food.  Limit fried foods.  Do not fry foods. Cook foods using methods such as baking, boiling, grilling, and broiling instead.  When eating at a restaurant, ask that your food be prepared with less salt, or no salt if possible. What foods can I eat? Seek help from a dietitian for individual calorie needs. Grains  Whole grain or whole wheat bread. Brown rice. Whole grain or whole wheat pasta. Quinoa, bulgur, and whole grain cereals. Low-sodium cereals. Corn or whole wheat flour tortillas. Whole grain cornbread. Whole grain crackers. Low-sodium crackers. Vegetables  Fresh or frozen vegetables (raw, steamed, roasted, or  grilled). Low-sodium or reduced-sodium tomato and vegetable juices. Low-sodium or reduced-sodium tomato sauce and paste. Low-sodium or reduced-sodium canned vegetables. Fruits  All fresh, canned (in natural juice), or frozen fruits. Meat and Other Protein Products  Ground beef (85% or leaner), grass-fed beef, or beef trimmed of fat. Skinless chicken or [place]. Ground chicken or [place]. Pork trimmed of fat. All fish and seafood. Eggs. Dried beans, peas, or lentils. Unsalted nuts and seeds. Unsalted canned beans. Dairy  Low-fat dairy products, such as skim or 1% milk, 2% or reduced-fat cheeses, low-fat ricotta or cottage cheese, or plain low-fat yogurt. Low-sodium or reduced-sodium cheeses. Fats and Oils  Tub margarines without trans fats. Light or reduced-fat mayonnaise and salad dressings (reduced sodium). Avocado. Safflower, olive, or canola oils. Natural peanut or almond butter. Other  Unsalted popcorn and pretzels. The items listed above may not be a complete list of recommended foods or beverages. Contact your dietitian for more options.  What foods are not recommended? Grains  White bread. White pasta. White rice. Refined cornbread. Bagels and croissants. Crackers that contain trans fat. Vegetables  Creamed or fried vegetables. Vegetables in a cheese sauce. Regular canned vegetables. Regular canned tomato sauce and paste. Regular tomato and vegetable juices. Fruits  Canned fruit in light or heavy syrup. Fruit juice. Meat and Other Protein Products  Fatty cuts of meat. Ribs, chicken wings, bacon, sausage, bologna, salami, chitterlings, fatback, hot dogs, bratwurst, and packaged luncheon meats. Salted nuts and seeds. Canned beans with salt. Dairy  Whole or 2% milk, cream, half-and-half, and cream cheese. Whole-fat or sweetened yogurt. Full-fat cheeses  or blue cheese. Nondairy creamers and whipped toppings. Processed cheese, cheese spreads, or cheese curds. Condiments  Onion and garlic  salt, seasoned salt, table salt, and sea salt. Canned and packaged gravies. Worcestershire sauce. Tartar sauce. Barbecue sauce. Teriyaki sauce. Soy sauce, including reduced sodium. Steak sauce. Fish sauce. Oyster sauce. Cocktail sauce. Horseradish. Ketchup and mustard. Meat flavorings and tenderizers. Bouillon cubes. Hot sauce. Tabasco sauce. Marinades. Taco seasonings. Relishes. Fats and Oils  Butter, stick margarine, lard, shortening, ghee, and bacon fat. Coconut, palm kernel, or palm oils. Regular salad dressings. Other  Pickles and olives. Salted popcorn and pretzels. The items listed above may not be a complete list of foods and beverages to avoid. Contact your dietitian for more information.  Where can I find more information? National Heart, Lung, and Blood Institute: travelstabloid.com This information is not intended to replace advice given to you by your health care provider. Make sure you discuss any questions you have with your health care provider. Document Released: 05/08/2011 Document Revised: 10/25/2015 Document Reviewed: 03/23/2013 Elsevier Interactive Patient Education  2017 [place] Prevention in the Home Introduction Falls can cause injuries. They can happen to people of all ages. There are many things you can do to make your home safe and to help prevent falls. What can I do on the outside of my home?  Regularly fix the edges of walkways and driveways and fix any cracks.  Remove anything that might make you trip as you walk through a door, such as a raised step or threshold.  Trim any bushes or trees on the path to your home.  Use bright outdoor lighting.  Clear any walking paths of anything that might make someone trip, such as rocks or tools.  Regularly check to see if handrails are loose or broken. Make sure that both sides of any steps have handrails.  Any raised decks and porches should have guardrails on the  edges.  Have any leaves, snow, or ice cleared regularly.  Use sand or salt on walking paths during winter.  Clean up any spills in your garage right away. This includes oil or grease spills. What can I do in the bathroom?  Use night lights.  Install grab bars by the toilet and in the tub and shower. Do not use towel bars as grab bars.  Use non-skid mats or decals in the tub or shower.  If you need to sit down in the shower, use a plastic, non-slip stool.  Keep the floor dry. Clean up any water that spills on the floor as soon as it happens.  Remove soap buildup in the tub or shower regularly.  Attach bath mats securely with double-sided non-slip rug tape.  Do not have throw rugs and other things on the floor that can make you trip. What can I do in the bedroom?  Use night lights.  Make sure that you have a light by your bed that is easy to reach.  Do not use any sheets or blankets that are too big for your bed. They should not hang down onto the floor.  Have a firm chair that has side arms. You can use this for support while you get dressed.  Do not have throw rugs and other things on the floor that can make you trip. What can I do in the kitchen?  Clean up any spills right away.  Avoid walking on wet floors.  Keep items that you use a lot in  easy-to-reach places.  If you need to reach something above you, use a strong step stool that has a grab bar.  Keep electrical cords out of the way.  Do not use floor polish or wax that makes floors slippery. If you must use wax, use non-skid floor wax.  Do not have throw rugs and other things on the floor that can make you trip. What can I do with my stairs?  Do not leave any items on the stairs.  Make sure that there are handrails on both sides of the stairs and use them. Fix handrails that are broken or loose. Make sure that handrails are as long as the stairways.  Check any carpeting to make sure that it is firmly  attached to the stairs. Fix any carpet that is loose or worn.  Avoid having throw rugs at the top or bottom of the stairs. If you do have throw rugs, attach them to the floor with carpet tape.  Make sure that you have a light switch at the top of the stairs and the bottom of the stairs. If you do not have them, ask someone to add them for you. What else can I do to help prevent falls?  Wear shoes that:  Do not have high heels.  Have rubber bottoms.  Are comfortable and fit you well.  Are closed at the toe. Do not wear sandals.  If you use a stepladder:  Make sure that it is fully opened. Do not climb a closed stepladder.  Make sure that both sides of the stepladder are locked into place.  Ask someone to hold it for you, if possible.  Clearly mark and make sure that you can see:  Any grab bars or handrails.  First and last steps.  Where the edge of each step is.  Use tools that help you move around (mobility aids) if they are needed. These include:  Canes.  Walkers.  Scooters.  Crutches.  Turn on the lights when you go into a dark area. Replace any light bulbs as soon as they burn out.  Set up your furniture so you have a clear path. Avoid moving your furniture around.  If any of your floors are uneven, fix them.  If there are any pets around you, be aware of where they are.  Review your medicines with your doctor. Some medicines can make you feel dizzy. This can increase your chance of falling. Ask your doctor what other things that you can do to help prevent falls. This information is not intended to replace advice given to you by your health care provider. Make sure you discuss any questions you have with your health care provider. Document Released: 03/15/2009 Document Revised: 10/25/2015 Document Reviewed: 06/23/2014  2017 Elsevier  Health Maintenance, Female Introduction Adopting a healthy lifestyle and getting preventive care can go a long way to  promote health and wellness. Talk with your health care provider about what schedule of regular examinations is right for you. This is a good chance for you to check in with your provider about disease prevention and staying healthy. In between checkups, there are plenty of things you can do on your own. Experts have done a lot of research about which lifestyle changes and preventive measures are most likely to keep you healthy. Ask your health care provider for more information. Weight and diet Eat a healthy diet  Be sure to include plenty of vegetables, fruits, low-fat dairy products, and lean protein.  Do not eat a lot of foods high in solid fats, added sugars, or salt.  Get regular exercise. This is one of the most important things you can do for your health.  Most adults should exercise for at least 150 minutes each week. The exercise should increase your heart rate and make you sweat (moderate-intensity exercise).  Most adults should also do strengthening exercises at least twice a week. This is in addition to the moderate-intensity exercise. Maintain a healthy weight  Body mass index (BMI) is a measurement that can be used to identify possible weight problems. It estimates body fat based on height and weight. Your health care provider can help determine your BMI and help you achieve or maintain a healthy weight.  For females 19 years of age and older:  A BMI below 18.5 is considered underweight.  A BMI of 18.5 to 24.9 is normal.  A BMI of 25 to 29.9 is considered overweight.  A BMI of 30 and above is considered obese. Watch levels of cholesterol and blood lipids  You should start having your blood tested for lipids and cholesterol at 76 years of age, then have this test every 5 years.  You may need to have your cholesterol levels checked more often if:  Your lipid or cholesterol levels are high.  You are older than 77 years of age.  You are at high risk for heart  disease. Cancer screening Lung Cancer  Lung cancer screening is recommended for adults 79-6 years old who are at high risk for lung cancer because of a history of smoking.  A yearly low-dose CT scan of the lungs is recommended for people who:  Currently smoke.  Have quit within the past 15 years.  Have at least a 30-pack-year history of smoking. A pack year is smoking an average of one pack of cigarettes a day for 1 year.  Yearly screening should continue until it has been 15 years since you quit.  Yearly screening should stop if you develop a health problem that would prevent you from having lung cancer treatment. Breast Cancer  Practice breast self-awareness. This means understanding how your breasts normally appear and feel.  It also means doing regular breast self-exams. Let your health care provider know about any changes, no matter how small.  If you are in your 20s or 30s, you should have a clinical breast exam (CBE) by a health care provider every 1-3 years as part of a regular health exam.  If you are 44 or older, have a CBE every year. Also consider having a breast X-ray (mammogram) every year.  If you have a family history of breast cancer, talk to your health care provider about genetic screening.  If you are at high risk for breast cancer, talk to your health care provider about having an MRI and a mammogram every year.  Breast cancer gene (BRCA) assessment is recommended for women who have family members with BRCA-related cancers. BRCA-related cancers include:  Breast.  Ovarian.  Tubal.  Peritoneal cancers.  Results of the assessment will determine the need for genetic counseling and BRCA1 and BRCA2 testing. Cervical Cancer  Your health care provider may recommend that you be screened regularly for cancer of the pelvic organs (ovaries, uterus, and vagina). This screening involves a pelvic examination, including checking for microscopic changes to the surface  of your cervix (Pap test). You may be encouraged to have this screening done every 3 years, beginning at age 52.  For  women ages 86-65, health care providers may recommend pelvic exams and Pap testing every 3 years, or they may recommend the Pap and pelvic exam, combined with testing for human papilloma virus (HPV), every 5 years. Some types of HPV increase your risk of cervical cancer. Testing for HPV may also be done on women of any age with unclear Pap test results.  Other health care providers may not recommend any screening for nonpregnant women who are considered low risk for pelvic cancer and who do not have symptoms. Ask your health care provider if a screening pelvic exam is right for you.  If you have had past treatment for cervical cancer or a condition that could lead to cancer, you need Pap tests and screening for cancer for at least 20 years after your treatment. If Pap tests have been discontinued, your risk factors (such as having a new sexual partner) need to be reassessed to determine if screening should resume. Some women have medical problems that increase the chance of getting cervical cancer. In these cases, your health care provider may recommend more frequent screening and Pap tests. Colorectal Cancer  This type of cancer can be detected and often prevented.  Routine colorectal cancer screening usually begins at 77 years of age and continues through 77 years of age.  Your health care provider may recommend screening at an earlier age if you have risk factors for colon cancer.  Your health care provider may also recommend using home test kits to check for hidden blood in the stool.  A small camera at the end of a tube can be used to examine your colon directly (sigmoidoscopy or colonoscopy). This is done to check for the earliest forms of colorectal cancer.  Routine screening usually begins at age 37.  Direct examination of the colon should be repeated every 5-10 years  through 77 years of age. However, you may need to be screened more often if early forms of precancerous polyps or small growths are found. Skin Cancer  Check your skin from head to toe regularly.  Tell your health care provider about any new moles or changes in moles, especially if there is a change in a mole's shape or color.  Also tell your health care provider if you have a mole that is larger than the size of a pencil eraser.  Always use sunscreen. Apply sunscreen liberally and repeatedly throughout the day.  Protect yourself by wearing long sleeves, pants, a wide-brimmed hat, and sunglasses whenever you are outside. Heart disease, diabetes, and high blood pressure  High blood pressure causes heart disease and increases the risk of stroke. High blood pressure is more likely to develop in:  People who have blood pressure in the high end of the normal range (130-139/85-89 mm Hg).  People who are overweight or obese.  People who are African American.  If you are 65-7 years of age, have your blood pressure checked every 3-5 years. If you are 33 years of age or older, have your blood pressure checked every year. You should have your blood pressure measured twice-once when you are at a hospital or clinic, and once when you are not at a hospital or clinic. Record the average of the two measurements. To check your blood pressure when you are not at a hospital or clinic, you can use:  An automated blood pressure machine at a pharmacy.  A home blood pressure monitor.  If you are between 66 years and 46 years old, ask  your health care provider if you should take aspirin to prevent strokes.  Have regular diabetes screenings. This involves taking a blood sample to check your fasting blood sugar level.  If you are at a normal weight and have a low risk for diabetes, have this test once every three years after 77 years of age.  If you are overweight and have a high risk for diabetes, consider  being tested at a younger age or more often. Preventing infection Hepatitis B  If you have a higher risk for hepatitis B, you should be screened for this virus. You are considered at high risk for hepatitis B if:  You were born in a country where hepatitis B is common. Ask your health care provider which countries are considered high risk.  Your parents were born in a high-risk country, and you have not been immunized against hepatitis B (hepatitis B vaccine).  You have HIV or AIDS.  You use needles to inject street drugs.  You live with someone who has hepatitis B.  You have had sex with someone who has hepatitis B.  You get hemodialysis treatment.  You take certain medicines for conditions, including cancer, organ transplantation, and autoimmune conditions. Hepatitis C  Blood testing is recommended for:  Everyone born from [birth year] through 1967.  Anyone with known risk factors for hepatitis C. Sexually transmitted infections (STIs)  You should be screened for sexually transmitted infections (STIs) including gonorrhea and chlamydia if:  You are sexually active and are younger than 77 years of age.  You are older than 77 years of age and your health care provider tells you that you are at risk for this type of infection.  Your sexual activity has changed since you were last screened and you are at an increased risk for chlamydia or gonorrhea. Ask your health care provider if you are at risk.  If you do not have HIV, but are at risk, it may be recommended that you take a prescription medicine daily to prevent HIV infection. This is called pre-exposure prophylaxis (PrEP). You are considered at risk if:  You are sexually active and do not regularly use condoms or know the HIV status of your partner(s).  You take drugs by injection.  You are sexually active with a partner who has HIV. Talk with your health care provider about whether you are at high risk of being infected with  HIV. If you choose to begin PrEP, you should first be tested for HIV. You should then be tested every 3 months for as long as you are taking PrEP. Pregnancy  If you are premenopausal and you may become pregnant, ask your health care provider about preconception counseling.  If you may become pregnant, take 400 to 800 micrograms (mcg) of folic acid every day.  If you want to prevent pregnancy, talk to your health care provider about birth control (contraception). Osteoporosis and menopause  Osteoporosis is a disease in which the bones lose minerals and strength with aging. This can result in serious bone fractures. Your risk for osteoporosis can be identified using a bone density scan.  If you are 24 years of age or older, or if you are at risk for osteoporosis and fractures, ask your health care provider if you should be screened.  Ask your health care provider whether you should take a calcium or vitamin D supplement to lower your risk for osteoporosis.  Menopause may have certain physical symptoms and risks.  Hormone replacement therapy may  reduce some of these symptoms and risks. Talk to your health care provider about whether hormone replacement therapy is right for you. Follow these instructions at home:  Schedule regular health, dental, and eye exams.  Stay current with your immunizations.  Do not use any tobacco products including cigarettes, chewing tobacco, or electronic cigarettes.  If you are pregnant, do not drink alcohol.  If you are breastfeeding, limit how much and how often you drink alcohol.  Limit alcohol intake to no more than 1 drink per day for nonpregnant women. One drink equals 12 ounces of beer, 5 ounces of wine, or 1 ounces of hard liquor.  Do not use street drugs.  Do not share needles.  Ask your health care provider for help if you need support or information about quitting drugs.  Tell your health care provider if you often feel depressed.  Tell  your health care provider if you have ever been abused or do not feel safe at home. This information is not intended to replace advice given to you by your health care provider. Make sure you discuss any questions you have with your health care provider. Document Released: 12/02/2010 Document Revised: 10/25/2015 Document Reviewed: 02/20/2015  2017 Elsevier

## 2016-05-22 NOTE — Progress Notes (Signed)
I have reviewed this visit and discussed with Howell Rucks, RN, BSN, and agree with her documentation.   Virginia Crews, MD, MPH PGY-3,  [facility] 05/22/2016 8:48 AM

## 2016-07-28 ENCOUNTER — Other Ambulatory Visit: Payer: Self-pay | Admitting: Family Medicine

## 2016-08-26 ENCOUNTER — Other Ambulatory Visit: Payer: Self-pay | Admitting: Family Medicine

## 2016-08-26 ENCOUNTER — Ambulatory Visit (INDEPENDENT_AMBULATORY_CARE_PROVIDER_SITE_OTHER): Payer: Medicare Other | Admitting: Podiatry

## 2016-08-26 DIAGNOSIS — L84 Corns and callosities: Secondary | ICD-10-CM | POA: Diagnosis not present

## 2016-08-26 DIAGNOSIS — M779 Enthesopathy, unspecified: Secondary | ICD-10-CM

## 2016-08-26 DIAGNOSIS — M25572 Pain in left ankle and joints of left foot: Secondary | ICD-10-CM

## 2016-08-26 MED ORDER — TRIAMCINOLONE ACETONIDE 10 MG/ML IJ SUSP
10.0000 mg | Freq: Once | INTRAMUSCULAR | Status: AC
  Administered 2016-08-26: 10 mg

## 2016-08-27 NOTE — Progress Notes (Signed)
Subjective:     Patient ID: Erin Esparza, female   DOB: [birth year], 76 y.o.   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 Reappoint for routine care

## 2016-09-09 ENCOUNTER — Ambulatory Visit: Payer: Medicare Other | Admitting: Podiatry

## 2016-09-11 ENCOUNTER — Ambulatory Visit (INDEPENDENT_AMBULATORY_CARE_PROVIDER_SITE_OTHER): Payer: Medicare Other | Admitting: Podiatry

## 2016-09-11 DIAGNOSIS — M779 Enthesopathy, unspecified: Secondary | ICD-10-CM

## 2016-09-11 DIAGNOSIS — R600 Localized edema: Secondary | ICD-10-CM

## 2016-09-11 MED ORDER — TRIAMCINOLONE ACETONIDE 10 MG/ML IJ SUSP
10.0000 mg | Freq: Once | INTRAMUSCULAR | Status: AC
  Administered 2016-09-11: 10 mg

## 2016-09-14 NOTE — Progress Notes (Signed)
Subjective:     Patient ID: Erin Esparza, female   DOB: 05-02-1941, 76 y.o.   MRN: [id number]  HPI patient presents with a lot of swelling within the right foot and ankle and states that she has had this problem for a while but it seems worse   Review of Systems     Objective:   Physical Exam Neurovascular status was found to be intact with a negative Homans sign noted bilateral. I noted on the right foot there is inflammation of the sinus tarsi and that there is also inflammation lateral with +1 pitting edema in the foot and lower ankle. Negative for signs of issues as far as clotting goes    Assessment:     Inflammatory condition with sinus tarsi inflammation right and also +1 to +2 pitting edema    Plan:     H&P x-rays reviewed and today went ahead and did a sinus tarsi injection done in the lateral extensor tendons 3 mg Kenalog 5 mill grams Xylocaine and then applied Unna boot and surgical shoe to reduce swelling. Reappoint in 2 weeks or earlier if any issues should occur  X-ray report indicates that there is no signs of acute fracture with moderate arthritic and flatfoot condition

## 2016-09-25 ENCOUNTER — Ambulatory Visit (INDEPENDENT_AMBULATORY_CARE_PROVIDER_SITE_OTHER): Payer: Medicare Other | Admitting: Podiatry

## 2016-09-25 DIAGNOSIS — M779 Enthesopathy, unspecified: Secondary | ICD-10-CM

## 2016-09-25 DIAGNOSIS — R600 Localized edema: Secondary | ICD-10-CM

## 2016-09-26 ENCOUNTER — Other Ambulatory Visit: Payer: Self-pay | Admitting: Family Medicine

## 2016-09-26 NOTE — Telephone Encounter (Signed)
Attempted to call pt, no answer. Will try again on Monday. Deseree Kennon Holter, CMA

## 2016-09-26 NOTE — Telephone Encounter (Signed)
Needs refill on lasix.  Has been going to [facility] and the dr there recommended she start back on her lasix because her ankles were swollen,. She uses the [facility] at [place]. Please let pt know if dr will call this in

## 2016-09-26 NOTE — Telephone Encounter (Signed)
Patient should come in for a f/u appt for her leg swelling.  She should be evaluated for other causes of leg swelling.   Virginia Crews, MD, MPH PGY-3,  [facility] 09/26/2016 9:52 AM

## 2016-09-27 NOTE — Progress Notes (Signed)
Subjective:    Patient ID: Erin Esparza, female   DOB: 76 y.o.   MRN: [id number]   HPI patient states she's doing a lot better ankle with significant diminishment of pain    ROS      Objective:  Physical Exam Neurovascular status intact with pain that's reduced quite a bit in the right ankle    Assessment:     Reduced inflammation of the right ankle secondary to medication    Plan:    Advised on physical therapy anti-inflammatories supportive shoes. Patient be seen back for [place] to recheck again

## 2016-10-02 NOTE — Telephone Encounter (Signed)
Called patient, no answer, no voicemail

## 2016-10-03 ENCOUNTER — Other Ambulatory Visit: Payer: Self-pay | Admitting: Internal Medicine

## 2016-10-07 ENCOUNTER — Other Ambulatory Visit: Payer: Self-pay | Admitting: Hematology and Oncology

## 2016-10-07 DIAGNOSIS — Z853 Personal history of malignant neoplasm of breast: Secondary | ICD-10-CM

## 2016-10-14 ENCOUNTER — Ambulatory Visit (INDEPENDENT_AMBULATORY_CARE_PROVIDER_SITE_OTHER): Payer: Medicare Other | Admitting: Internal Medicine

## 2016-10-14 ENCOUNTER — Encounter: Payer: Self-pay | Admitting: Internal Medicine

## 2016-10-14 DIAGNOSIS — M79622 Pain in left upper arm: Secondary | ICD-10-CM | POA: Diagnosis not present

## 2016-10-14 NOTE — Patient Instructions (Addendum)
It was nice meeting you today Ms. Royse!  If your arm starts bothering you again, you can take Tylenol to help with the pain.   Please be sure to come to your appointment on Tuesday, May 22 with Dr. Brita Romp. You can discuss your fluid pills and other medications at that time.   If you have any questions or concerns, please feel free to call the clinic.   Be well,  Dr. Avon Gully

## 2016-10-14 NOTE — Progress Notes (Signed)
   Subjective:   Patient: Erin Esparza       Birthdate: Apr 03, 1941       MRN: [id number]      HPI  Erin Esparza is a 76 y.o. female presenting for same day appt for arm pain.   L arm pain Began last night when patient was cold. She put a sweater on and the pain went away, but the pain then returned. Describes pain as an aching that begins above her elbow and extends to her shoulder. She took Tylenol last night and the pain resolved. Had some pain this morning when she woke up, but pain has since completely resolved spontaneously. 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 Has not had limited ROM. Denies weakness. Denies SOB or chest pain.   Smoking status reviewed. Patient is former smoker (quit 1999).   Review of Systems See HPI.     Objective:  Physical Exam  Constitutional: She is oriented to person, place, and time.  Pleasant, obese elderly female in NAD.   HENT:  Head: Normocephalic and atraumatic.  Eyes: Conjunctivae are normal.  Musculoskeletal:  No TTP of upper extremities. 5/5 strength UE bilaterally. Full grip strength bilaterally. Full ROM of L arm. No bony abnormalities noted.   Neurological: She is alert and oriented to person, place, and time.  Skin: Skin is warm and dry.  Psychiatric: Affect and judgment normal.      Assessment & Plan:  Left upper arm pain Spontaneously resolved at time of appt. Previously resolved with use of one Tylenol tablet. Full ROM. Full strength. Possibly due to patient's arthritis or possible awkward positioning/movement of affected arm causing temporary aching.  - No further follow-up necessary at this time - Can take Tylenol if needed    Adin Hector, MD, MPH PGY-2 [facility] Pager [phone]

## 2016-10-14 NOTE — Assessment & Plan Note (Signed)
Spontaneously resolved at time of appt. Previously resolved with use of one Tylenol tablet. Full ROM. Full strength. Possibly due to patient's arthritis or possible awkward positioning/movement of affected arm causing temporary aching.  - No further follow-up necessary at this time - Can take Tylenol if needed

## 2016-10-16 ENCOUNTER — Encounter (HOSPITAL_COMMUNITY): Payer: Self-pay

## 2016-10-16 ENCOUNTER — Observation Stay (HOSPITAL_COMMUNITY)
Admission: EM | Admit: 2016-10-16 | Discharge: 2016-10-18 | Disposition: A | Discharge status: Home / Self Care | Payer: Medicare Other | Attending: Family Medicine | Admitting: Family Medicine

## 2016-10-16 ENCOUNTER — Emergency Department (HOSPITAL_COMMUNITY): Payer: Medicare Other

## 2016-10-16 DIAGNOSIS — K56609 Unspecified intestinal obstruction, unspecified as to partial versus complete obstruction: Principal | ICD-10-CM | POA: Insufficient documentation

## 2016-10-16 DIAGNOSIS — N390 Urinary tract infection, site not specified: Secondary | ICD-10-CM

## 2016-10-16 DIAGNOSIS — Z86711 Personal history of pulmonary embolism: Secondary | ICD-10-CM | POA: Insufficient documentation

## 2016-10-16 DIAGNOSIS — R109 Unspecified abdominal pain: Secondary | ICD-10-CM

## 2016-10-16 DIAGNOSIS — I872 Venous insufficiency (chronic) (peripheral): Secondary | ICD-10-CM | POA: Diagnosis not present

## 2016-10-16 DIAGNOSIS — Z79811 Long term (current) use of aromatase inhibitors: Secondary | ICD-10-CM | POA: Diagnosis not present

## 2016-10-16 DIAGNOSIS — I5032 Chronic diastolic (congestive) heart failure: Secondary | ICD-10-CM | POA: Insufficient documentation

## 2016-10-16 DIAGNOSIS — Z79899 Other long term (current) drug therapy: Secondary | ICD-10-CM | POA: Diagnosis not present

## 2016-10-16 DIAGNOSIS — Z923 Personal history of irradiation: Secondary | ICD-10-CM | POA: Diagnosis not present

## 2016-10-16 DIAGNOSIS — Z7901 Long term (current) use of anticoagulants: Secondary | ICD-10-CM | POA: Insufficient documentation

## 2016-10-16 DIAGNOSIS — Z86718 Personal history of other venous thrombosis and embolism: Secondary | ICD-10-CM | POA: Diagnosis not present

## 2016-10-16 DIAGNOSIS — K529 Noninfective gastroenteritis and colitis, unspecified: Secondary | ICD-10-CM

## 2016-10-16 DIAGNOSIS — J45909 Unspecified asthma, uncomplicated: Secondary | ICD-10-CM | POA: Insufficient documentation

## 2016-10-16 DIAGNOSIS — Z87891 Personal history of nicotine dependence: Secondary | ICD-10-CM | POA: Diagnosis not present

## 2016-10-16 DIAGNOSIS — E8881 Metabolic syndrome: Secondary | ICD-10-CM | POA: Insufficient documentation

## 2016-10-16 DIAGNOSIS — R1031 Right lower quadrant pain: Secondary | ICD-10-CM | POA: Diagnosis not present

## 2016-10-16 DIAGNOSIS — I110 Hypertensive heart disease with heart failure: Secondary | ICD-10-CM | POA: Insufficient documentation

## 2016-10-16 DIAGNOSIS — R188 Other ascites: Secondary | ICD-10-CM | POA: Insufficient documentation

## 2016-10-16 DIAGNOSIS — Z853 Personal history of malignant neoplasm of breast: Secondary | ICD-10-CM | POA: Insufficient documentation

## 2016-10-16 LAB — DIFFERENTIAL
Basophils Absolute: 0 10*3/uL (ref 0.0–0.1)
Basophils Relative: 0 %
Eosinophils Absolute: 0.2 10*3/uL (ref 0.0–0.7)
Eosinophils Relative: 1 %
Lymphocytes Relative: 14 %
Lymphs Abs: 1.8 10*3/uL (ref 0.7–4.0)
Monocytes Absolute: 0.5 10*3/uL (ref 0.1–1.0)
Monocytes Relative: 4 %
Neutro Abs: 10.2 10*3/uL — ABNORMAL HIGH (ref 1.7–7.7)
Neutrophils Relative %: 81 %

## 2016-10-16 LAB — CBC
HCT: 39.9 % (ref 36.0–46.0)
Hemoglobin: 13 g/dL (ref 12.0–15.0)
MCH: 29.7 pg (ref 26.0–34.0)
MCHC: 32.6 g/dL (ref 30.0–36.0)
MCV: 91.1 fL (ref 78.0–100.0)
Platelets: 251 10*3/uL (ref 150–400)
RBC: 4.38 MIL/uL (ref 3.87–5.11)
RDW: 13.6 % (ref 11.5–15.5)
WBC: 12.6 10*3/uL — ABNORMAL HIGH (ref 4.0–10.5)

## 2016-10-16 LAB — COMPREHENSIVE METABOLIC PANEL
ALT: 14 U/L (ref 14–54)
AST: 18 U/L (ref 15–41)
Albumin: 3.2 g/dL — ABNORMAL LOW (ref 3.5–5.0)
Alkaline Phosphatase: 74 U/L (ref 38–126)
Anion gap: 11 (ref 5–15)
BUN: 15 mg/dL (ref 6–20)
CO2: 22 mmol/L (ref 22–32)
Calcium: 9.6 mg/dL (ref 8.9–10.3)
Chloride: 104 mmol/L (ref 101–111)
Creatinine, Ser: 1.03 mg/dL — ABNORMAL HIGH (ref 0.44–1.00)
GFR calc Af Amer: 59 mL/min — ABNORMAL LOW (ref >60)
GFR calc non Af Amer: 51 mL/min — ABNORMAL LOW (ref >60)
Glucose, Bld: 152 mg/dL — ABNORMAL HIGH (ref 65–99)
Potassium: 3.8 mmol/L (ref 3.5–5.1)
Sodium: 137 mmol/L (ref 135–145)
Total Bilirubin: 0.4 mg/dL (ref 0.3–1.2)
Total Protein: 8.1 g/dL (ref 6.5–8.1)

## 2016-10-16 LAB — CBG MONITORING, ED: Glucose-Capillary: 143 mg/dL — ABNORMAL HIGH (ref 65–99)

## 2016-10-16 LAB — LIPASE, BLOOD: Lipase: 18 U/L (ref 11–51)

## 2016-10-16 MED ORDER — IOPAMIDOL (ISOVUE-300) INJECTION 61%
INTRAVENOUS | Status: AC
  Administered 2016-10-16: 100 mL
  Filled 2016-10-16: qty 100

## 2016-10-16 MED ORDER — MORPHINE SULFATE (PF) 4 MG/ML IV SOLN
4.0000 mg | Freq: Once | INTRAVENOUS | Status: AC
  Administered 2016-10-16: 4 mg via INTRAVENOUS
  Filled 2016-10-16: qty 1

## 2016-10-16 NOTE — ED Notes (Signed)
Lab contacted to add on differential

## 2016-10-16 NOTE — ED Notes (Signed)
Pt given a ice pack to place behind neck above shoulders due to complaining of being hot and changed into a gown.

## 2016-10-16 NOTE — ED Triage Notes (Signed)
Pt arrives with daughter with c/o weakness and abdominal pain that started at 6pm tonight; pt presents diaphoretic and hot; Pt denies any hx; Pt a&ox on arrival. Pt was hypotensive at triage;Nurse first notified to find room for patient.

## 2016-10-16 NOTE — ED Notes (Signed)
Called pt to be roomed. No response.

## 2016-10-16 NOTE — ED Notes (Signed)
Checked CBG 143, RN informed

## 2016-10-16 NOTE — ED Provider Notes (Signed)
[facility] DEPT Provider Note   CSN: [id number] Arrival date & time: 10/16/16  2118  By signing my name below, I, Dolores Hoose, attest that this documentation has been prepared under the direction and in the presence of Delora Fuel, MD . Electronically Signed: Dolores Hoose, Scribe. 10/16/2016. 10:59 PM.   History   Chief Complaint Chief Complaint  Patient presents with  . Fatigue  . Abdominal Pain   The history is provided by the patient. No language interpreter was used.    HPI Comments:  Erin Esparza is a 76 y.o. female with pmhx of breast cancer, DVT, and HTN who presents to the Emergency Department complaining of intermittent, mild abdominal pain onset today. She describes her pain as a "50/10" cramping pain, worse in her lower quadrants, and non-radiating. She reports associated weakness and fatigue. No modifying factors indicated. No medications tried PTA. Denies any fever, chills, diaphoresis, nausea, constipation, diarrhea, dysuria, frequency or difficulty urinating. No previous abdominal surgical hx.   Past Medical History:  Diagnosis Date  . Arthritis   . Asthma   . Breast cancer ([place]) 01/06/12   left breast invasive ductal ca,dcis,ER/PR=+,  . Breast mass in female   . Clotting disorder ([place])   . Diastolic congestive heart failure ([place])   . DVT (deep venous thrombosis) ([place])   . Hypertension   . Insulin resistance   . Pulmonary embolism ([place])   . Radiation 02/12/12 -03/12/12   left breast, total 50gy  . Right ankle pain   . Shortness of breath   . Venous stasis of lower extremity   . Wears dentures    upper  . Wears glasses   . Wears partial dentures    lower    Patient Active Problem List   Diagnosis Date Noted  . Left upper arm pain 10/14/2016  . Prediabetes 02/27/2016  . Plantar fasciitis of right foot 02/27/2014  . [place] arthritis 06/15/2013  . Pain of skin 02/18/2013  . Paresthesia of right leg 09/30/2012  . Venous stasis of lower extremity   .  Preventative health care 03/05/2012  . Primary cancer of upper inner quadrant of left female breast ([place]) 01/20/2012  . 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 Breast cancer ([place]) 01/06/2012  . Leg swelling 04/09/2011  . Pulmonary embolism ([place]) 03/26/2011  . 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 STASIS DERMATITIS, CHRONIC 10/25/2009  . DEEP VENOUS THROMBOPHLEBITIS, HX OF 07/28/2008  . OBESITY 01/27/2008  . HYPERTENSION, BENIGN ESSENTIAL 09/24/2006  . ASTHMA, PERSISTENT 09/24/2006  . DEGENERATIVE JOINT DISEASE, KNEE 09/04/2006    Past Surgical History:  Procedure Laterality Date  . BREAST BIOPSY  11/03/11   left breast bx 10 o'clock=benign breast parenchyma  . BREAST LUMPECTOMY  01/06/12   Left breast/ Dr. Erroll Luna  . NO PAST SURGERIES      OB History    No data available       Home Medications    Prior to Admission medications   Medication Sig Start Date End Date Taking? Authorizing Provider  acetaminophen (TYLENOL) 500 MG tablet Take 1 tablet (500 mg total) by mouth every 6 (six) hours as needed. Patient taking differently: Take 500 mg by mouth every 6 (six) hours as needed for moderate pain.  08/25/12   Cook, Jayce G, DO  ADVAIR DISKUS 100-50 MCG/DOSE AEPB INHALE 1 PUFF BY MOUTH TWICE DAILY AS DIRECTED 03/04/16   Bacigalupo, Dionne Bucy, MD  ADVAIR DISKUS 100-50 MCG/DOSE AEPB INHALE 1 PUFF BY  MOUTH TWICE DAILY AS DIRECTED 10/03/16   Bacigalupo, Dionne Bucy, MD  anastrozole (ARIMIDEX) 1 MG tablet TAKE 1 TABLET(1 MG) BY MOUTH DAILY 03/03/16   Nicholas Lose, MD  Calcium Carbonate (CALTRATE 600 PO) Take 1 tablet by mouth 2 (two) times daily.     [provider]  enalapril (VASOTEC) 20 MG tablet TAKE 1 TABLET BY MOUTH TWICE DAILY 07/28/16   Bacigalupo, Dionne Bucy, MD  furosemide (LASIX) 40 MG tablet Take 1 tablet (40 mg total) by mouth daily as needed (For leg swelling.). Patient not taking: Reported on 05/20/2016 10/04/13   Coral Spikes, DO  hydrochlorothiazide (HYDRODIURIL)  25 MG tablet TAKE 1 TABLET BY MOUTH DAILY 05/06/16   Virginia Crews, MD  Multiple Vitamin (MULTIVITAMIN) tablet Take 1 tablet by mouth daily.    [provider]  Multiple Vitamins-Minerals (ICAPS AREDS 2 PO) Take 1 capsule by mouth 2 (two) times daily.    [provider]  Multiple Vitamins-Minerals (WOMENS BONE HEALTH PO) Take 1 tablet by mouth daily.    [provider]  XARELTO 20 MG TABS tablet TAKE 1 TABLET BY MOUTH DAILY 08/26/16   Bacigalupo, Dionne Bucy, MD    Family History Family History  Problem Relation Age of Onset  . Heart disease Mother   . Heart attack Mother   . Cancer Sister        Breast  . Parkinson's disease Brother   . Pulmonary embolism Daughter   . Diabetes Daughter   . Pulmonary embolism Daughter     Social History Social History  Substance Use Topics  . Smoking status: Former Smoker    Packs/day: 0.30    Years: 15.00    Types: Cigarettes    Quit date: 06/02/1997  . Smokeless tobacco: Never Used  . Alcohol use No     Allergies   Patient has no known allergies.   Review of Systems Review of Systems  Constitutional: Positive for fatigue. Negative for chills, diaphoresis and fever.  Gastrointestinal: Positive for abdominal pain. Negative for constipation, diarrhea and nausea.  Genitourinary: Negative for difficulty urinating, dysuria and frequency.  Neurological: Positive for weakness.  All other systems reviewed and are negative.    Physical Exam Updated Vital Signs BP (!) 116/52   Pulse (!) 110   Temp 98.3 F (36.8 C) (Oral)   Resp 20   SpO2 98%   Physical Exam  Constitutional: She is oriented to person, place, and time. She appears well-developed and well-nourished.  HENT:  Head: Normocephalic and atraumatic.  Eyes: EOM are normal. Pupils are equal, round, and reactive to light.  Neck: Normal range of motion. Neck supple. No JVD present.  Cardiovascular: Normal rate, regular rhythm and normal heart sounds.    No murmur heard. Pulmonary/Chest: Effort normal and breath sounds normal. She has no wheezes. She has no rales. She exhibits no tenderness.  Abdominal: Soft. Bowel sounds are normal. She exhibits no distension and no mass. There is tenderness. There is no rebound and no guarding.  Mild tenderness in RLQ. No rebound or guarding. Bowel sounds decreased.   Musculoskeletal: Normal range of motion. She exhibits no edema.  Lymphadenopathy:    She has no cervical adenopathy.  Neurological: She is alert and oriented to person, place, and time. No cranial nerve deficit. She exhibits normal muscle tone. Coordination normal.  Skin: Skin is warm and dry. No rash noted.  Psychiatric: She has a normal mood and affect. Her behavior is normal. Judgment and thought  content normal.  Nursing note and vitals reviewed.   ED Treatments / Results  DIAGNOSTIC STUDIES:  Oxygen Saturation is 98% on RA, normal by my interpretation.    COORDINATION OF CARE:  11:05 PM Discussed treatment plan with pt at bedside which included CT abdomen with contrast, pain medication, and urinalysis and pt agreed to plan.  Labs (all labs ordered are listed, but only abnormal results are displayed) Labs Reviewed  COMPREHENSIVE METABOLIC PANEL - Abnormal; Notable for the following:       Result Value   Glucose, Bld 152 (*)    Creatinine, Ser 1.03 (*)    Albumin 3.2 (*)    GFR calc non Af Amer 51 (*)    GFR calc Af Amer 59 (*)    All other components within normal limits  CBC - Abnormal; Notable for the following:    WBC 12.6 (*)    All other components within normal limits  URINALYSIS, ROUTINE W REFLEX MICROSCOPIC - Abnormal; Notable for the following:    Color, Urine AMBER (*)    APPearance HAZY (*)    Protein, ur 100 (*)    Leukocytes, UA MODERATE (*)    Bacteria, UA MANY (*)    Squamous Epithelial / LPF 0-5 (*)    All other components within normal limits  DIFFERENTIAL - Abnormal; Notable for the following:     Neutro Abs 10.2 (*)    All other components within normal limits  CBG MONITORING, ED - Abnormal; Notable for the following:    Glucose-Capillary 143 (*)    All other components within normal limits  URINE CULTURE  LIPASE, BLOOD  DIFFERENTIAL    EKG  EKG Interpretation  Date/Time:  Thursday Oct 16 2016 22:00:27 EDT Ventricular Rate:  104 PR Interval:  162 QRS Duration: 118 QT Interval:  384 QTC Calculation: 504 R Axis:   -51 Text Interpretation:  Sinus tachycardia Left axis deviation Left ventricular hypertrophy with QRS widening Cannot rule out Septal infarct , age undetermined Abnormal ECG Since last tracing rate faster Confirmed by Noemi Chapel [phone]) on 10/16/2016 10:44:13 PM       Radiology Ct Abdomen Pelvis W Contrast  Result Date: 10/17/2016 CLINICAL DATA:  Generalized weakness and abdominal pain since 6 p.m. EXAM: CT ABDOMEN AND PELVIS WITH CONTRAST TECHNIQUE: Multidetector CT imaging of the abdomen and pelvis was performed using the standard protocol following bolus administration of intravenous contrast. CONTRAST:  113mL ISOVUE-300 IOPAMIDOL (ISOVUE-300) INJECTION 61% COMPARISON:  None. FINDINGS: Lower chest: Top normal size cardiac chambers without pericardial effusion. There is atelectasis at the lung bases. Normal coronary arteriosclerosis is noted. Hepatobiliary: Tiny subcentimeter too small to further characterize hypodensities are seen in the liver, the largest in the right hepatic lobe measuring 9 mm. Statistically these are likely to represent small cysts or hemangiomata. No biliary dilatation is identified. The gallbladder is physiologically distended. Pancreas: Normal pancreas without inflammation or ductal dilatation. No pancreatic mass. Spleen: Normal in size without focal abnormality. Adrenals/Urinary Tract: A 2.2 cm water attenuating cyst in the upper pole left kidney is identified. Normal adrenal glands. No nephrolithiasis, renal mass nor bladder abnormality.  Stomach/Bowel: The stomach is physiologically distended with fluid. There is normal small bowel rotation. Mild diffuse transmural thickening is suggested of small intestine suspicious for diffuse enteritis or potentially result small-bowel obstruction. Large bowel demonstrates fluid and stool within. No bowel obstruction is noted. Vascular/Lymphatic: Aortoiliac atherosclerosis.  No lymphadenopathy. Reproductive: Uterus and bilateral adnexa are unremarkable. Other: Small fat and  fluid containing inguinal hernia the right. No bowel herniation is identified. A small to moderate amount of ascites is seen in the upper abdomen and pelvis possibly related to small bowel enteritis. Musculoskeletal: Thoracolumbar spondylosis. Broad-based disc bulge at L5-S1 associated with degenerative disc disease. IMPRESSION: 1. Diffuse thickened appearance of small intestine with small to moderate amount of ascites in the abdomen. Differential possibilities might include a diffuse enteritis or if the patient had a recent small bowel obstruction that has resolved, this may explain some of the fluid present. There is a fluid-filled right-sided inguinal hernia not containing herniated bowel but potentially this could have been a site of mechanical obstruction, now resolved. 2. Hypodensities of the liver and left kidney as above described statistically consistent with cysts. 3. Aortoiliac atherosclerosis.  Coronary arteriosclerosis. Electronically Signed   By: Ashley Royalty M.D.   On: 10/17/2016 00:30    Procedures Procedures (including critical care time)  Medications Ordered in ED Medications  ciprofloxacin (CIPRO) IVPB 400 mg (not administered)  metroNIDAZOLE (FLAGYL) IVPB 500 mg (not administered)  morphine 4 MG/ML injection 4 mg (4 mg Intravenous Given 10/16/16 2341)  iopamidol (ISOVUE-300) 61 % injection (100 mLs  Contrast Given 10/16/16 2355)     Initial Impression / Assessment and Plan / ED Course  I have reviewed the  triage vital signs and the nursing notes.  Pertinent labs & imaging results that were available during my care of the patient were reviewed by me and considered in my medical decision making (see chart for details).  Crampy abdominal pain of uncertain cause. Exam localizes tenderness to right lower quadrant which is worrisome for possible appendicitis. Consider diverticulitis, ovarian cyst, urolithiasis, urinary tract infection. Initial laboratory workup shows mild leukocytosis. She will be sent for CT of abdomen and pelvis. Old records are reviewed, and she has no relevant past visits.  CT shows diffuse thickening of the small bowel consistent with enteritis. Also, small amount of ascites is present. Urinalysis shows evidence of UTI with too numerous to count WBCs and many bacteria. She started on ciprofloxacin and metronidazole. I'm concerned about her diffuse enteritis and feel she should be observed in the hospital for at least overnight. Case is discussed with Dr. Cyndia Skeeters of family practice service who agrees to admit the patient.  Final Clinical Impressions(s) / ED Diagnoses   Final diagnoses:  Abdominal pain, unspecified abdominal location  Urinary tract infection without hematuria, site unspecified  Enteritis  Other ascites    New Prescriptions New Prescriptions   No medications on file   I personally performed the services described in this documentation, which was scribed in my presence. The recorded information has been reviewed and is accurate.       Delora Fuel, MD 67/20/94 [phone]

## 2016-10-17 ENCOUNTER — Encounter (HOSPITAL_COMMUNITY): Payer: Self-pay | Admitting: General Practice

## 2016-10-17 DIAGNOSIS — R188 Other ascites: Secondary | ICD-10-CM | POA: Diagnosis not present

## 2016-10-17 DIAGNOSIS — R109 Unspecified abdominal pain: Secondary | ICD-10-CM

## 2016-10-17 DIAGNOSIS — N390 Urinary tract infection, site not specified: Secondary | ICD-10-CM

## 2016-10-17 DIAGNOSIS — K529 Noninfective gastroenteritis and colitis, unspecified: Secondary | ICD-10-CM

## 2016-10-17 DIAGNOSIS — K56609 Unspecified intestinal obstruction, unspecified as to partial versus complete obstruction: Secondary | ICD-10-CM | POA: Diagnosis not present

## 2016-10-17 HISTORY — DX: Unspecified abdominal pain: R10.9

## 2016-10-17 LAB — URINALYSIS, ROUTINE W REFLEX MICROSCOPIC
Bilirubin Urine: NEGATIVE
Glucose, UA: NEGATIVE mg/dL
Hgb urine dipstick: NEGATIVE
Ketones, ur: NEGATIVE mg/dL
Nitrite: NEGATIVE
Protein, ur: 100 mg/dL — AB
Specific Gravity, Urine: 1.024 (ref 1.005–1.030)
pH: 5 (ref 5.0–8.0)

## 2016-10-17 LAB — PROTIME-INR
INR: 1.28
Prothrombin Time: 16.1 seconds — ABNORMAL HIGH (ref 11.4–15.2)

## 2016-10-17 LAB — HEPARIN LEVEL (UNFRACTIONATED): Heparin Unfractionated: 0.89 IU/mL — ABNORMAL HIGH (ref 0.30–0.70)

## 2016-10-17 LAB — APTT: aPTT: 72 seconds — ABNORMAL HIGH (ref 24–36)

## 2016-10-17 MED ORDER — CIPROFLOXACIN IN D5W 400 MG/200ML IV SOLN
400.0000 mg | Freq: Once | INTRAVENOUS | Status: AC
  Administered 2016-10-17: 400 mg via INTRAVENOUS
  Filled 2016-10-17: qty 200

## 2016-10-17 MED ORDER — ANASTROZOLE 1 MG PO TABS
1.0000 mg | ORAL_TABLET | Freq: Every day | ORAL | Status: DC
  Administered 2016-10-18: 1 mg via ORAL
  Filled 2016-10-17 (×2): qty 1

## 2016-10-17 MED ORDER — HYDROCHLOROTHIAZIDE 25 MG PO TABS
25.0000 mg | ORAL_TABLET | Freq: Every day | ORAL | Status: DC
Start: 2016-10-17 — End: 2016-10-18
  Administered 2016-10-18: 25 mg via ORAL
  Filled 2016-10-17: qty 1

## 2016-10-17 MED ORDER — METRONIDAZOLE IN NACL 5-0.79 MG/ML-% IV SOLN
500.0000 mg | Freq: Once | INTRAVENOUS | Status: AC
  Administered 2016-10-17: 500 mg via INTRAVENOUS
  Filled 2016-10-17: qty 100

## 2016-10-17 MED ORDER — ALBUTEROL SULFATE (2.5 MG/3ML) 0.083% IN NEBU: 2.5000 mg | INHALATION_SOLUTION | RESPIRATORY_TRACT | Status: DC | PRN

## 2016-10-17 MED ORDER — ENALAPRIL MALEATE 20 MG PO TABS
20.0000 mg | ORAL_TABLET | Freq: Two times a day (BID) | ORAL | Status: DC
Start: 2016-10-17 — End: 2016-10-18
  Filled 2016-10-17 (×4): qty 1

## 2016-10-17 MED ORDER — MOMETASONE FURO-FORMOTEROL FUM 100-5 MCG/ACT IN AERO
2.0000 | INHALATION_SPRAY | Freq: Two times a day (BID) | RESPIRATORY_TRACT | Status: DC
  Administered 2016-10-17 – 2016-10-18 (×3): 2 via RESPIRATORY_TRACT
  Filled 2016-10-17: qty 8.8

## 2016-10-17 MED ORDER — HEPARIN (PORCINE) IN NACL 100-0.45 UNIT/ML-% IJ SOLN
1250.0000 [IU]/h | INTRAMUSCULAR | Status: DC
  Administered 2016-10-17 (×2): 1400 [IU]/h via INTRAVENOUS
  Filled 2016-10-17 (×2): qty 250

## 2016-10-17 NOTE — H&P (Signed)
[facility] Admission History and Physical Service Pager: [phone]  Patient name: Erin Esparza Medical record number: [id number] Date of birth: 05/29/1941 Age: 76 y.o. Gender: female  Primary Care Provider: Virginia Crews, MD Consultants: none Code Status: full (obtained on admission)  Chief Complaint: Abdominal pain  Assessment and Plan: Erin Esparza is a 76 y.o. female presenting with abdominal pain . PMH is significant for breast cancer status post lumpectomy and radiation, CHF (no echo in the system) hypertension, prediabetes, PE/DVT, PAD, venous stasis  Abdominal pain: Unclear etiology. Differential diagnosis include gastroenteritis but patient without other GI symptoms. CT was mild to moderate ascites and finding concerning for enteritis or resolving SBO. Patient has history of breast cancer status post  Lumpectomy and radiation in August 2013. So cannot rule out GI metastases although she denies constitutional symptoms. UA concerning for UTI but she has no UTI symptoms. Her tenderness is also across the upper abdomen. CMP and CBC reassuring except for mild leukocytosis to 12.6. Denies history of heavy drinking or hepatitis. -Placed under observation. Attending Dr. Andria Frames -Nothing by mouth except for sips with meds -Continue Cipro and Flagyl -Ascites as below  Ascites: CT abdomen and pelvis with mild-to-moderate ascites. No history of heavy drinking or hepatitis.  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 Last dose yesterday morning -Hold  Xarelto for possible diagnostic paracentesis -Heparin gtt per pharmacy  HFpEF: No echo in the system. No signs of fluid overload -Consider resuming home Lasix. She has been out of this medication for a while -We will continue to monitor  Hypertension: Normotensive. On enalapril and hydrochlorothiazide -Continue home  blood pressure medications  History of breast cancer: Status post lumpectomy and radiation in August 2013. No constitutional symptoms. Mammogram in 6 2017 without malignancy. -Continue home anastrozole  Prediabetes: Last A1c 5.8 about 2 months ago -Order CBG before meals and at bedtime when she starts eating  History of asthma: On Advair at home -Dulera twice a day   PAD/Venous insufficiency with status dermatitis: Dark thick rubbery skin in lower extremities. She has pitting edema above the ankle bilaterally. DP and PT pulses nonpalpable -May need ABI to assess for PAD -Resume Lasix in the morning as needed  FEN/GI:  -Heart healthy diet  Prophylaxis: On heparin gtt while awaiting decision on her ascitis  Disposition: Placed under observation  History of Present Illness:  Erin Esparza is a 76 y.o. female presenting with abdominal pain since yesterday afternoon.  Patient reports having diffuse abdominal pain since yesterday afternoon. Onset was spontaneous. She described the pain as cramping. Pain is intermittent. She is currently pain free for the last one hour after she was given pain medication. She also reports feeling weak. Has been eating and drinking as usual up until this afternoon. Denies nausea, vomiting, diarrhea, constipation, dysuria, blood in urine, blood in stool, dark stool, chills, chest pain, shortness of breath, cough, cold symptoms, night sweats, weight loss, new medicine, new food, recent travels or sick contact. She denies fever but reports feeling hot on arrival to ED.  She denies smoking cigarettes, drinking alcohol or recreational drug use. She denies history of heavy drinking or hepatitis.   ED course: Hypotensive to 82/49 and tachycardic to 110 on arrival. Hypertension and tachycardia resolved without intervention.  CMP within normal limits. CBC with leukocytosis to 12.6 otherwise normal. UA with many bacteria and moderate LE, many WBCs  and proteinuria to 100. CT  abdomen and pelvis with diffuse thickened appearance of small intestine with small to moderate amount of ascites in the abdomen concerning for diffuse enteritis or resolving SBO. She was started on Cipro and Flagyl and family medicine was called to admit.  Review Of Systems: Per HPI with the following additions:  ROS Review of systems negative except for pertinent positives and negatives in history of present illness above.  Patient Active Problem List   Diagnosis Date Noted  . Left upper arm pain 10/14/2016  . Prediabetes 02/27/2016  . Plantar fasciitis of right foot 02/27/2014  . [place] arthritis 06/15/2013  . Pain of skin 02/18/2013  . Paresthesia of right leg 09/30/2012  . Venous stasis of lower extremity   . Preventative health care 03/05/2012  . Primary cancer of upper inner quadrant of left female breast ([place]) 01/20/2012  . 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 Breast cancer ([place]) 01/06/2012  . Leg swelling 04/09/2011  . Pulmonary embolism ([place]) 03/26/2011  . 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 STASIS DERMATITIS, CHRONIC 10/25/2009  . DEEP VENOUS THROMBOPHLEBITIS, HX OF 07/28/2008  . OBESITY 01/27/2008  . HYPERTENSION, BENIGN ESSENTIAL 09/24/2006  . ASTHMA, PERSISTENT 09/24/2006  . DEGENERATIVE JOINT DISEASE, KNEE 09/04/2006    Past Medical History: Past Medical History:  Diagnosis Date  . Arthritis   . Asthma   . Breast cancer ([place]) 01/06/12   left breast invasive ductal ca,dcis,ER/PR=+,  . Breast mass in female   . Clotting disorder ([place])   . Diastolic congestive heart failure ([place])   . DVT (deep venous thrombosis) ([place])   . Hypertension   . Insulin resistance   . Pulmonary embolism ([place])   . Radiation 02/12/12 -03/12/12   left breast, total 50gy  . Right ankle pain   . Shortness of breath   . Venous stasis of lower extremity   . Wears dentures    upper  . Wears glasses   . Wears partial dentures    lower    Past Surgical History: Past Surgical  History:  Procedure Laterality Date  . BREAST BIOPSY  11/03/11   left breast bx 10 o'clock=benign breast parenchyma  . BREAST LUMPECTOMY  01/06/12   Left breast/ Dr. Erroll Luna  . NO PAST SURGERIES      Social History: Social History  Substance Use Topics  . Smoking status: Former Smoker    Packs/day: 0.30    Years: 15.00    Types: Cigarettes    Quit date: 06/02/1997  . Smokeless tobacco: Never Used  . Alcohol use No   Additional social history: Lives with daughter.   Please also refer to relevant sections of EMR.  Family History: Family History  Problem Relation Age of Onset  . Heart disease Mother   . Heart attack Mother   . Cancer Sister        Breast  . Parkinson's disease Brother   . Pulmonary embolism Daughter   . Diabetes Daughter   . Pulmonary embolism Daughter    (If not completed, MUST add something in)  Allergies and Medications: No Known Allergies No current facility-administered medications on file prior to encounter.    Current Outpatient Prescriptions on File Prior to Encounter  Medication Sig Dispense Refill  . acetaminophen (TYLENOL) 500 MG tablet Take 1 tablet (500 mg total) by mouth every 6 (six) hours as needed. (Patient taking differently: Take 500 mg by mouth every 6 (six) hours as needed for moderate  pain. ) 30 tablet 3  . ADVAIR DISKUS 100-50 MCG/DOSE AEPB INHALE 1 PUFF BY MOUTH TWICE DAILY AS DIRECTED 60 each 1  . anastrozole (ARIMIDEX) 1 MG tablet TAKE 1 TABLET(1 MG) BY MOUTH DAILY 90 tablet 3  . Calcium Carbonate (CALTRATE 600 PO) Take 1 tablet by mouth 2 (two) times daily.     . enalapril (VASOTEC) 20 MG tablet TAKE 1 TABLET BY MOUTH TWICE DAILY 180 tablet 0  . hydrochlorothiazide (HYDRODIURIL) 25 MG tablet TAKE 1 TABLET BY MOUTH DAILY 90 tablet 2  . Multiple Vitamin (MULTIVITAMIN) tablet Take 1 tablet by mouth daily.    . Multiple Vitamins-Minerals (ICAPS AREDS 2 PO) Take 1 capsule by mouth 2 (two) times daily.    . Multiple  Vitamins-Minerals (WOMENS BONE HEALTH PO) Take 1 tablet by mouth daily.    Alveda Reasons 20 MG TABS tablet TAKE 1 TABLET BY MOUTH DAILY 90 tablet 0  . ADVAIR DISKUS 100-50 MCG/DOSE AEPB INHALE 1 PUFF BY MOUTH TWICE DAILY AS DIRECTED (Patient not taking: Reported on 10/17/2016) 60 each 3  . furosemide (LASIX) 40 MG tablet Take 1 tablet (40 mg total) by mouth daily as needed (For leg swelling.). (Patient not taking: Reported on 05/20/2016) 30 tablet 3    Objective: BP (!) 116/52   Pulse (!) 110   Temp 98.3 F (36.8 C) (Oral)   Resp 20   SpO2 98%  Exam: GEN: appears well, no apparent distress. Head: normocephalic and atraumatic  Eyes: conjunctiva without injection, sclera anicteric Oropharynx: mmm without erythema or exudation CVS: RRR, nl s1 & s2, no murmurs, 1+ pitting edema bilaterally. DP and PT pulses nonpalpable RESP: speaks in full sentence, no IWOB, good air movement bilaterally, CTAB GI: BS present & normal, soft, diffuse tenderness to palpation of upper quadrant bilaterally. No Murphy sign. Mild rebound tenderness as well.  GU: no suprapubic or CVA tenderness MSK: no focal tenderness or notable swelling SKIN: Dark take leathery skin over her lower extremities NEURO: alert and oiented appropriately, no gross defecits  PSYCH: euthymic mood with congruent affect  Labs and Imaging: CBC BMET   Recent Labs Lab 10/16/16 2155  WBC 12.6*  HGB 13.0  HCT 39.9  PLT 251    Recent Labs Lab 10/16/16 2155  NA 137  K 3.8  CL 104  CO2 22  BUN 15  CREATININE 1.03*  GLUCOSE 152*  CALCIUM 9.6     Ct Abdomen Pelvis W Contrast  Result Date: 10/17/2016 CLINICAL DATA:  Generalized weakness and abdominal pain since 6 p.m. EXAM: CT ABDOMEN AND PELVIS WITH CONTRAST TECHNIQUE: Multidetector CT imaging of the abdomen and pelvis was performed using the standard protocol following bolus administration of intravenous contrast. CONTRAST:  155mL ISOVUE-300 IOPAMIDOL (ISOVUE-300) INJECTION 61%  COMPARISON:  None. FINDINGS: Lower chest: Top normal size cardiac chambers without pericardial effusion. There is atelectasis at the lung bases. Normal coronary arteriosclerosis is noted. Hepatobiliary: Tiny subcentimeter too small to further characterize hypodensities are seen in the liver, the largest in the right hepatic lobe measuring 9 mm. Statistically these are likely to represent small cysts or hemangiomata. No biliary dilatation is identified. The gallbladder is physiologically distended. Pancreas: Normal pancreas without inflammation or ductal dilatation. No pancreatic mass. Spleen: Normal in size without focal abnormality. Adrenals/Urinary Tract: A 2.2 cm water attenuating cyst in the upper pole left kidney is identified. Normal adrenal glands. No nephrolithiasis, renal mass nor bladder abnormality. Stomach/Bowel: The stomach is physiologically distended with fluid. There is normal small  bowel rotation. Mild diffuse transmural thickening is suggested of small intestine suspicious for diffuse enteritis or potentially result small-bowel obstruction. Large bowel demonstrates fluid and stool within. No bowel obstruction is noted. Vascular/Lymphatic: Aortoiliac atherosclerosis.  No lymphadenopathy. Reproductive: Uterus and bilateral adnexa are unremarkable. Other: Small fat and fluid containing inguinal hernia the right. No bowel herniation is identified. A small to moderate amount of ascites is seen in the upper abdomen and pelvis possibly related to small bowel enteritis. Musculoskeletal: Thoracolumbar spondylosis. Broad-based disc bulge at L5-S1 associated with degenerative disc disease. IMPRESSION: 1. Diffuse thickened appearance of small intestine with small to moderate amount of ascites in the abdomen. Differential possibilities might include a diffuse enteritis or if the patient had a recent small bowel obstruction that has resolved, this may explain some of the fluid present. There is a fluid-filled  right-sided inguinal hernia not containing herniated bowel but potentially this could have been a site of mechanical obstruction, now resolved. 2. Hypodensities of the liver and left kidney as above described statistically consistent with cysts. 3. Aortoiliac atherosclerosis.  Coronary arteriosclerosis. Electronically Signed   By: Ashley Royalty M.D.   On: 10/17/2016 00:30    Mercy Riding, MD 10/17/2016, 1:38 AM PGY-2, [place] Intern pager: [phone], text pages welcome

## 2016-10-17 NOTE — ED Notes (Signed)
Family member took pts purse home and left her cell phone at the bedside.

## 2016-10-17 NOTE — Progress Notes (Signed)
ANTICOAGULATION CONSULT NOTE - Follow Up Consult  Pharmacy Consult for Heparin Indication: h/o DVt/PE  No Known Allergies  Patient Measurements: Height: 5\' 6"  (167.6 cm) Weight: 286 lb 1.6 oz (129.8 kg) IBW/kg (Calculated) : 59.3 Heparin Dosing Weight:  91 kg  Vital Signs: Temp: 97.6 F (36.4 C) (05/18 0827) Temp Source: Oral (05/18 0827) BP: 119/48 (05/18 0827) Pulse Rate: 76 (05/18 0827)  Labs:  Recent Labs  10/16/16 2155 10/17/16 1207  HGB 13.0  --   HCT 39.9  --   PLT 251  --   APTT  --  72*  LABPROT  --  16.1*  INR  --  1.28  HEPARINUNFRC  --  0.89*  CREATININE 1.03*  --     Estimated Creatinine Clearance: 63.2 mL/min (A) (by C-G formula based on SCr of 1.03 mg/dL (H)).  Assessment: Anticoag: Transition from Xarelto to UFH for h/o PE/DVT for possible invasive procedures (diagnostic paracentesis); last dose of Xarelto taken 5/16 at 1800. Baseline Hgb/Plts WNL. - HL 0.89, aPTT 72   Goal of Therapy:  aPTT 66-102 seconds Monitor platelets by anticoagulation protocol: Yes   Plan:  Continue heparin gtt at 1400 units/hr and  Monitor heparin levels, aPTT (until levels no longer affected by Xarelto), and CBC.   Crystal S. Alford Highland, PharmD, BCPS Clinical Staff Pharmacist Pager [phone]  Eilene Ghazi Stillinger 10/17/2016,1:50 PM

## 2016-10-17 NOTE — Care Management Obs Status (Signed)
[place] NOTIFICATION   Patient Details  Name: Erin Esparza MRN: [id number] Date of Birth: [birth year]   Medicare Observation Status Notification Given:  Yes    Sharin Mons, RN 10/17/2016, 1:00 PM

## 2016-10-17 NOTE — Progress Notes (Signed)
ANTICOAGULATION CONSULT NOTE - Initial Consult  Pharmacy Consult for heparin Indication: h/o PE/DVT  No Known Allergies  Patient Measurements: Height: 5\' 6"  (167.6 cm) Weight: 287 lb 14.7 oz (130.6 kg) IBW/kg (Calculated) : 59.3 Heparin Dosing Weight: 90kg  Vital Signs: Temp: 98.3 F (36.8 C) (05/17 2147) Temp Source: Oral (05/17 2147) BP: 116/52 (05/17 2241) Pulse Rate: 110 (05/17 2147)  Labs:  Recent Labs  10/16/16 2155  HGB 13.0  HCT 39.9  PLT 251  CREATININE 1.03*    Estimated Creatinine Clearance: 63.4 mL/min (A) (by C-G formula based on SCr of 1.03 mg/dL (H)).   Medical History: Past Medical History:  Diagnosis Date  . Arthritis   . Asthma   . Breast cancer ([place]) 01/06/12   left breast invasive ductal ca,dcis,ER/PR=+,  . Breast mass in female   . Clotting disorder ([place])   . Diastolic congestive heart failure ([place])   . DVT (deep venous thrombosis) ([place])   . Hypertension   . Insulin resistance   . Pulmonary embolism ([place])   . Radiation 02/12/12 -03/12/12   left breast, total 50gy  . Right ankle pain   . Shortness of breath   . Venous stasis of lower extremity   . Wears dentures    upper  . Wears glasses   . Wears partial dentures    lower    Assessment: 76yo female admitted for abdominal pain and UTI, to transition from Xarelto to UFH for h/o PE/DVT for possible invasive procedures; last dose of Xarelto taken 5/16 at 1800.  Goal of Therapy:  Heparin level 0.3-0.7 units/ml aPTT 66-102 seconds Monitor platelets by anticoagulation protocol: Yes   Plan:  Will start heparin gtt at 1400 units/hr and monitor heparin levels, aPTT (until levels no longer affected by Xarelto), and CBC.  Wynona Neat, PharmD, BCPS  10/17/2016,2:50 AM

## 2016-10-18 ENCOUNTER — Observation Stay (HOSPITAL_COMMUNITY): Payer: Medicare Other

## 2016-10-18 ENCOUNTER — Observation Stay (HOSPITAL_BASED_OUTPATIENT_CLINIC_OR_DEPARTMENT_OTHER): Payer: Medicare Other

## 2016-10-18 ENCOUNTER — Other Ambulatory Visit: Payer: Self-pay | Admitting: Family Medicine

## 2016-10-18 DIAGNOSIS — I360 Nonrheumatic tricuspid (valve) stenosis: Secondary | ICD-10-CM | POA: Diagnosis not present

## 2016-10-18 DIAGNOSIS — I739 Peripheral vascular disease, unspecified: Secondary | ICD-10-CM | POA: Diagnosis not present

## 2016-10-18 DIAGNOSIS — R109 Unspecified abdominal pain: Secondary | ICD-10-CM | POA: Diagnosis not present

## 2016-10-18 DIAGNOSIS — K56609 Unspecified intestinal obstruction, unspecified as to partial versus complete obstruction: Secondary | ICD-10-CM | POA: Diagnosis not present

## 2016-10-18 DIAGNOSIS — R188 Other ascites: Secondary | ICD-10-CM | POA: Diagnosis not present

## 2016-10-18 LAB — HEPATITIS PANEL, ACUTE
HCV Ab: <0.1 s/co ratio (ref 0.0–0.9)
Hep A IgM: NEGATIVE
Hep B C IgM: NEGATIVE
Hepatitis B Surface Ag: NEGATIVE

## 2016-10-18 LAB — CBC
HCT: 35.6 % — ABNORMAL LOW (ref 36.0–46.0)
Hemoglobin: 11.3 g/dL — ABNORMAL LOW (ref 12.0–15.0)
MCH: 29 pg (ref 26.0–34.0)
MCHC: 31.7 g/dL (ref 30.0–36.0)
MCV: 91.5 fL (ref 78.0–100.0)
Platelets: 245 10*3/uL (ref 150–400)
RBC: 3.89 MIL/uL (ref 3.87–5.11)
RDW: 13.9 % (ref 11.5–15.5)
WBC: 10.1 10*3/uL (ref 4.0–10.5)

## 2016-10-18 LAB — ECHOCARDIOGRAM COMPLETE
FS: 30 % (ref 28–44)
Height: 66 in
IVS/LV PW RATIO, ED: 1.18
LA ID, A-P, ES: 44 mm
LA diam end sys: 44 mm
LA diam index: 1.74 cm/m2
LA vol A4C: 57.6 ml
LA vol index: 24 mL/m2
LA vol: 60.6 mL
LV PW d: 11 mm — AB (ref 0.6–1.1)
LV e' LATERAL: 7.07 cm/s
LVOT SV: 74 mL
LVOT VTI: 23.5 cm
LVOT area: 3.14 cm2
LVOT diameter: 20 mm
LVOT peak grad rest: 4 mmHg
LVOT peak vel: 103 cm/s
Lateral S' vel: 12.5 cm/s
TAPSE: 20.9 mm
TDI e' lateral: 7.07
TDI e' medial: 5.22
Weight: 4577.6 oz

## 2016-10-18 LAB — URINE CULTURE

## 2016-10-18 LAB — APTT
aPTT: 113 seconds — ABNORMAL HIGH (ref 24–36)
aPTT: 72 seconds — ABNORMAL HIGH (ref 24–36)

## 2016-10-18 LAB — HEPARIN LEVEL (UNFRACTIONATED)
Heparin Unfractionated: 0.89 IU/mL — ABNORMAL HIGH (ref 0.30–0.70)
Heparin Unfractionated: 0.84 IU/mL — ABNORMAL HIGH (ref 0.30–0.70)

## 2016-10-18 LAB — TSH: TSH: 3.44 u[IU]/mL (ref 0.350–4.500)

## 2016-10-18 MED ORDER — METRONIDAZOLE 500 MG PO TABS: 500.0000 mg | ORAL_TABLET | Freq: Two times a day (BID) | ORAL | 0 refills | Status: AC

## 2016-10-18 MED ORDER — CIPROFLOXACIN HCL 500 MG PO TABS
500.0000 mg | ORAL_TABLET | Freq: Two times a day (BID) | ORAL | Status: DC
  Administered 2016-10-18: 500 mg via ORAL
  Filled 2016-10-18: qty 1

## 2016-10-18 MED ORDER — METRONIDAZOLE 500 MG PO TABS
500.0000 mg | ORAL_TABLET | Freq: Two times a day (BID) | ORAL | Status: DC
  Administered 2016-10-18: 500 mg via ORAL

## 2016-10-18 MED ORDER — FUROSEMIDE 40 MG PO TABS: 40.0000 mg | ORAL_TABLET | Freq: Every day | ORAL | 0 refills | Status: DC | PRN

## 2016-10-18 MED ORDER — LIDOCAINE HCL 1 % IJ SOLN
INTRAMUSCULAR | Status: AC
  Filled 2016-10-18: qty 20

## 2016-10-18 MED ORDER — CIPROFLOXACIN HCL 500 MG PO TABS: 500.0000 mg | ORAL_TABLET | Freq: Two times a day (BID) | ORAL | 0 refills | Status: AC

## 2016-10-18 NOTE — Progress Notes (Signed)
VASCULAR LAB PRELIMINARY  ARTERIAL  ABI completed:    RIGHT    LEFT    PRESSURE WAVEFORM  PRESSURE WAVEFORM  BRACHIAL 161 Triphasic BRACHIAL Mastectomy Triphasic  DP 138 Triphasic DP 130 Biphasic  PT 158 Triphasic PT 138 Biphasic    RIGHT LEFT  ABI 0.96 0.86   ABIs and Doppler waveforms on the right indicate normal arterial flow at rest. Left ABIs and Doppler waveforms indicate a mild reduction in arterial flow at rest.  SLAUGHTER, [place], RVS 10/18/2016, 4:26 PM

## 2016-10-18 NOTE — Progress Notes (Addendum)
Patient discharge instruction reviewed, IV removed with catheter tip intact. Patient discharge instruction reviewed and she verbalized understanding using teach back. Patient have belonging that were at the bedside.  Patient prescriptions are at the pharmacy.

## 2016-10-18 NOTE — Progress Notes (Signed)
  Echocardiogram 2D Echocardiogram has been performed.  Majed T Mujalli 10/18/2016, 11:21 AM

## 2016-10-18 NOTE — Progress Notes (Signed)
Family Medicine Teaching Service Daily Progress Note Intern Pager: [phone]  Patient name: Erin Esparza Medical record number: [id number] Date of birth: 03-28-1941 Age: 76 y.o. Gender: female  Primary Care Provider: Virginia Crews, MD Consultants: None Code Status: Full   Pt Overview and Major Events to Date:  Admitted to [facility] on 5/18  Assessment and Plan: CHELLSIE GOMER is a 76 y.o. female presenting with abdominal pain . PMH is significant for breast cancer status post lumpectomy and radiation, CHF (no echo in the system) hypertension, prediabetes, PE/DVT, PAD, venous stasis  #Abdominal pain, resolved Patient's abdominal pain resolved quickly after admission after receiving 1 dose of morphine. Still unclear etiology with initial differential concerning for resolving SBO vs enteritis based on CT Abdomen. Ascites still concerning with history of breast cancer. Low suspicion for UTI however already cipro. Hepatitis panel is negative. Leukocytosis has resolved. Patient is tolerating po intake as well. Pain could also be secondary to possible UTI although patient has been asymptomatic, UA was suggestive of possible infection. Cipro and flagyl were not not given though initially part of the admission orders. Patient WBCs have trended down from 12.6 to 10.1 and vitals signs have been stable --Will consider antibiotic course prior to discharge in the setting of resolving abdominal pain but possible UTI.  #Ascites, pending paracentesis CT abdomen and pelvis with mild-to-moderate ascites. Hep panel negative however patient has hx of breast cancer and would also rule out GU malignancy because of her age. --Follow up on diagnostic paracentesis  #History of PE/DVT Patient on Xarelto, holding for paracentesis. Will transition back to Xarelto after procedure --Continue Heparin gtt per pharmacy  #HFpEF No echo in the system. No signs of fluid overload though patient had mild crackles on exam  yesterday. patiet was on lasix but apparently has not taken it in a while. --Follow up on ECHO --Monitor I/O  #Hypertension BP this morning 119/43. Will continue to monitor --Continue enalapril 20 mg  --Continueand hydrochlorothiazide 25 mg daily   #History of breast cancer Status post lumpectomy and radiation in August 2013. No constitutional symptoms. Mammogram in 11/2015 without malignancy. --Continue home anastrozole  #Prediabetes Last A1c 5.8 about 2 months ago --CBG qAC/hs   #Asthma On Advair at home --Continue Dulera 2 puffs bid   #PAD/Venous insufficiency with status dermatitis Clear stigmata of lower extremities venous insufficiency. She has pitting edema above the ankle bilaterally. DP and PT pulses nonpalpable --Follow up on ABI -- Consider resuming Lasix in the morning as needed   FEN/GI: Regular diet   Disposition: Home pending paracentesis, ECHO and clinical improvement   Subjective:  Patient is comfortable this morning and was getting her ECHO. Patient denies any abdominal pain, nausea , vomiting.  Objective: Temp:  [97.6 F (36.4 C)-99.1 F (37.3 C)] 98 F (36.7 C) (05/19 0617) Pulse Rate:  [76-88] 79 (05/19 0617) Resp:  [16-18] 18 (05/19 0617) BP: (112-129)/(43-67) 119/43 (05/19 0617) SpO2:  [92 %-100 %] 92 % (05/19 0617) Weight:  [286 lb 1.6 oz (129.8 kg)] 286 lb 1.6 oz (129.8 kg) (05/18 0827)   Physical Exam: General: NAD, pleasant, able to participate in exam Cardiac: RRR, normal heart sounds, no murmurs. 2+ radial and PT pulses bilaterally Respiratory: CTAB, normal effort, No wheezes, rales or rhonchi Abdomen: soft, nontender, nondistended, no hepatic or splenomegaly, +BS Extremities: no edema or cyanosis. WWP. Skin: warm and dry, no rashes noted Neuro: alert and oriented x4, no focal deficits Psych: Normal affect and mood   Laboratory:  Recent Labs Lab 10/16/16 2155 10/18/16 0427  WBC 12.6* 10.1  HGB 13.0 11.3*  HCT 39.9 35.6*   PLT 251 245    Recent Labs Lab 10/16/16 2155  NA 137  K 3.8  CL 104  CO2 22  BUN 15  CREATININE 1.03*  CALCIUM 9.6  PROT 8.1  BILITOT 0.4  ALKPHOS 74  ALT 14  AST 18  GLUCOSE 152*    Imaging/Diagnostic Tests:   Marjie Skiff, MD 10/18/2016, 7:39 AM PGY-1, [place] Intern pager: [phone], text pages welcome

## 2016-10-18 NOTE — Progress Notes (Signed)
ANTICOAGULATION CONSULT NOTE - Follow Up Consult  Pharmacy Consult for Heparin Indication: h/o DVt/PE  No Known Allergies  Patient Measurements: Height: 5\' 6"  (167.6 cm) Weight: 286 lb 1.6 oz (129.8 kg) IBW/kg (Calculated) : 59.3 Heparin Dosing Weight:  91 kg  Vital Signs: Temp: 98 F (36.7 C) (05/19 0617) Temp Source: Oral (05/19 0617) BP: 119/43 (05/19 0617) Pulse Rate: 79 (05/19 0617)  Labs:  Recent Labs  10/16/16 2155 10/17/16 1207 10/18/16 0427  HGB 13.0  --  11.3*  HCT 39.9  --  35.6*  PLT 251  --  245  APTT  --  72* 113*  LABPROT  --  16.1*  --   INR  --  1.28  --   HEPARINUNFRC  --  0.89* 0.89*  CREATININE 1.03*  --   --     Estimated Creatinine Clearance: 63.2 mL/min (A) (by C-G formula based on SCr of 1.03 mg/dL (H)).  Assessment: Anticoag: Transition from Xarelto to UFH for h/o PE/DVT for possible invasive procedures (diagnostic paracentesis); last dose of Xarelto taken 5/16 at 1800. Heparin level remains elevated (may partially be due to affect of Xarelto) however PTT now elevated to 113 sec (supratherapeutic) on gtt at 1400 units/hr. Hgb down a bit, no bleeding noted.  Goal of Therapy:  Heparin level 0.3-0.7 units/ml aPTT 66-102 seconds Monitor platelets by anticoagulation protocol: Yes   Plan:  Decrease heparin gtt to 1250 units/hr  Will f/u PTT and heparin level in 8 hours  Sherlon Handing, PharmD, BCPS Clinical pharmacist, pager [phone] 10/18/2016,6:32 AM

## 2016-10-18 NOTE — Progress Notes (Signed)
ANTICOAGULATION CONSULT NOTE - Follow Up Consult  Pharmacy Consult for Heparin Indication: h/o DVt/PE  No Known Allergies  Patient Measurements: Height: 5\' 6"  (167.6 cm) Weight: 286 lb 1.6 oz (129.8 kg) IBW/kg (Calculated) : 59.3 Heparin Dosing Weight:  91 kg  Vital Signs: Temp: 98.3 F (36.8 C) (05/19 1342) Temp Source: Oral (05/19 1342) BP: 125/54 (05/19 1342) Pulse Rate: 84 (05/19 1342)  Labs:  Recent Labs  10/16/16 2155 10/17/16 1207 10/18/16 0427 10/18/16 1404  HGB 13.0  --  11.3*  --   HCT 39.9  --  35.6*  --   PLT 251  --  245  --   APTT  --  72* 113* 72*  LABPROT  --  16.1*  --   --   INR  --  1.28  --   --   HEPARINUNFRC  --  0.89* 0.89* 0.84*  CREATININE 1.03*  --   --   --     Estimated Creatinine Clearance: 63.2 mL/min (A) (by C-G formula based on SCr of 1.03 mg/dL (H)).  Assessment: Anticoag: Transition from Xarelto to UFH for h/o PE/DVT for possible invasive procedures (diagnostic paracentesis); last dose of Xarelto taken 5/16 at 1800. Heparin level remains elevated (may partially be due to affect of Xarelto) however PTT now elevated to 113 sec (supratherapeutic) on gtt at 1400 units/hr. Hgb down a bit, no bleeding noted.  Repeat aPTT is now therapeutic at 72 seconds after reducing heparin to 1250 units/hr. No issues with infusion or bleeding noted.  Goal of Therapy:  Heparin level 0.3-0.7 units/ml aPTT 66-102 seconds Monitor platelets by anticoagulation protocol: Yes   Plan:  Continue heparin 1250 units/hr  8h confirmatory aPTT Daily aPTT/HL Monitor s/sx of bleeding  Andrey Cota. Diona Foley, PharmD, BCPS Clinical Pharmacist [phone] 10/18/2016,3:05 PM

## 2016-10-21 ENCOUNTER — Inpatient Hospital Stay: Payer: Medicare Other | Admitting: Family Medicine

## 2016-10-21 NOTE — Discharge Summary (Signed)
[facility] Discharge Summary  Patient name: Erin Esparza Medical record number: [id number] Date of birth: August 14, 1940 Age: 76 y.o. Gender: female Date of Admission: 10/16/2016  Date of Discharge: 10/18/2016 Admitting Physician: Zenia Resides, MD  Primary Care Provider: Virginia Crews, MD Consultants: None   Indication for Hospitalization: Abdominal pain  Discharge Diagnoses/Problem List:  Resolving SBO Gastroenteritis CHF HTN Venous insufficiency  Disposition: Home  Discharge Condition: Stable  Discharge Exam:  General: NAD, pleasant, able to participate in exam Cardiac: RRR, normal heart sounds, no murmurs. 2+ radial and PT pulses bilaterally Respiratory: CTAB, normal effort, No wheezes, rales or rhonchi Abdomen: soft, nontender, nondistended, no hepatic or splenomegaly, +BS Extremities: no edema or cyanosis. WWP. Skin: warm and dry, no rashes noted Neuro: alert and oriented x4, no focal deficits Psych: Normal affect and mood  Brief Hospital Course:  Erin Esparza a 76 y.o.femalewith a PMH significant for breast cancer status post lumpectomy and radiation, CHF (no echo in the system) hypertension, prediabetes, PE/DVT, PAD, venous stasis who presented  Upper quadrant abdominal pain initially concerning for resolving SBO vs enteritis. Patient received one dose morphine in the ED and was admitted for further evaluation and management. CT abdomen pelvis showed moderate ascites concerning for malignancy given history of breast cancer and age. Patient was started on ciprofloxacin and metronidazole and was put on bowel rest. UA showed many WBCs suspicious for UTI but patient had no symptoms which was more consistent with asymptomatic bacteruria. Diagnosis paracentesis was not done given lack of fluid pocket. Patient GI symptoms resolved during admission. Patient had mild crackles on exam and given history of CHF, ECHO was obtained which showed near  normal EF with 50-55% with normal wall motion and grade 1 diastolic dysfunction. Patient also had bilateral ABI with her history of peripheral vascular disease which showed mild reduction in arterial flow at rest in her left lower extremity. Hepatitis panel was negative in the setting of abdominal ascites.  Issues for Follow Up:  1. Complete ciprofloxacin and metronidazole course patient had 5 more days to go started on 5/18 expected completion date on 5/24. 2. Follow up on asymptomatic bacteruria, follow up with PCP if become symptomatic. 3. Consider GU follow up in the setting of moderate ascites noted on CT abdomen pelvis.  Significant Procedures: ECHO, ABI   Significant Labs and Imaging:   Recent Labs Lab 10/16/16 2155 10/18/16 0427  WBC 12.6* 10.1  HGB 13.0 11.3*  HCT 39.9 35.6*  PLT 251 245    Recent Labs Lab 10/16/16 2155  NA 137  K 3.8  CL 104  CO2 22  GLUCOSE 152*  BUN 15  CREATININE 1.03*  CALCIUM 9.6  ALKPHOS 74  AST 18  ALT 14  ALBUMIN 3.2*  Ct Abdomen Pelvis W Contrast  Result Date: 10/17/2016 CLINICAL DATA:  Generalized weakness and abdominal pain since 6 p.m. EXAM: CT ABDOMEN AND PELVIS WITH CONTRAST TECHNIQUE: Multidetector CT imaging of the abdomen and pelvis was performed using the standard protocol following bolus administration of intravenous contrast. CONTRAST:  172mL ISOVUE-300 IOPAMIDOL (ISOVUE-300) INJECTION 61% COMPARISON:  None. FINDINGS: Lower chest: Top normal size cardiac chambers without pericardial effusion. There is atelectasis at the lung bases. Normal coronary arteriosclerosis is noted. Hepatobiliary: Tiny subcentimeter too small to further characterize hypodensities are seen in the liver, the largest in the right hepatic lobe measuring 9 mm. Statistically these are likely to represent small cysts or hemangiomata. No biliary dilatation is identified. The  gallbladder is physiologically distended. Pancreas: Normal pancreas without inflammation or  ductal dilatation. No pancreatic mass. Spleen: Normal in size without focal abnormality. Adrenals/Urinary Tract: A 2.2 cm water attenuating cyst in the upper pole left kidney is identified. Normal adrenal glands. No nephrolithiasis, renal mass nor bladder abnormality. Stomach/Bowel: The stomach is physiologically distended with fluid. There is normal small bowel rotation. Mild diffuse transmural thickening is suggested of small intestine suspicious for diffuse enteritis or potentially result small-bowel obstruction. Large bowel demonstrates fluid and stool within. No bowel obstruction is noted. Vascular/Lymphatic: Aortoiliac atherosclerosis.  No lymphadenopathy. Reproductive: Uterus and bilateral adnexa are unremarkable. Other: Small fat and fluid containing inguinal hernia the right. No bowel herniation is identified. A small to moderate amount of ascites is seen in the upper abdomen and pelvis possibly related to small bowel enteritis. Musculoskeletal: Thoracolumbar spondylosis. Broad-based disc bulge at L5-S1 associated with degenerative disc disease. IMPRESSION: 1. Diffuse thickened appearance of small intestine with small to moderate amount of ascites in the abdomen. Differential possibilities might include a diffuse enteritis or if the patient had a recent small bowel obstruction that has resolved, this may explain some of the fluid present. There is a fluid-filled right-sided inguinal hernia not containing herniated bowel but potentially this could have been a site of mechanical obstruction, now resolved. 2. Hypodensities of the liver and left kidney as above described statistically consistent with cysts. 3. Aortoiliac atherosclerosis.  Coronary arteriosclerosis. Electronically Signed   By: Ashley Royalty M.D.   On: 10/17/2016 00:30   Results/Tests Pending at Time of Discharge: None  Discharge Medications:  Allergies as of 10/18/2016   No Known Allergies     Medication List    STOP taking these  medications   furosemide 40 MG tablet Commonly known as:  LASIX     TAKE these medications   acetaminophen 500 MG tablet Commonly known as:  TYLENOL Take 1 tablet (500 mg total) by mouth every 6 (six) hours as needed. What changed:  reasons to take this   ADVAIR DISKUS 100-50 MCG/DOSE Aepb Generic drug:  Fluticasone-Salmeterol INHALE 1 PUFF BY MOUTH TWICE DAILY AS DIRECTED   ADVAIR DISKUS 100-50 MCG/DOSE Aepb Generic drug:  Fluticasone-Salmeterol INHALE 1 PUFF BY MOUTH TWICE DAILY AS DIRECTED   anastrozole 1 MG tablet Commonly known as:  ARIMIDEX TAKE 1 TABLET(1 MG) BY MOUTH DAILY   CALTRATE 600 PO Take 1 tablet by mouth 2 (two) times daily.   ciprofloxacin 500 MG tablet Commonly known as:  CIPRO Take 1 tablet (500 mg total) by mouth 2 (two) times daily.   enalapril 20 MG tablet Commonly known as:  VASOTEC TAKE 1 TABLET BY MOUTH TWICE DAILY   hydrochlorothiazide 25 MG tablet Commonly known as:  HYDRODIURIL TAKE 1 TABLET BY MOUTH DAILY   ICAPS AREDS 2 PO Take 1 capsule by mouth 2 (two) times daily.   WOMENS BONE HEALTH PO Take 1 tablet by mouth daily.   metroNIDAZOLE 500 MG tablet Commonly known as:  FLAGYL Take 1 tablet (500 mg total) by mouth every 12 (twelve) hours.   multivitamin tablet Take 1 tablet by mouth daily.   XARELTO 20 MG Tabs tablet Generic drug:  rivaroxaban TAKE 1 TABLET BY MOUTH DAILY       Discharge Instructions: Please refer to Patient Instructions section of EMR for full details.  Patient was counseled important signs and symptoms that should prompt return to medical care, changes in medications, dietary instructions, activity restrictions, and follow up appointments.  Follow-Up Appointments:   Marjie Skiff, MD 10/21/2016, 12:56 AM PGY-1, [place]

## 2016-10-24 ENCOUNTER — Encounter: Payer: Self-pay | Admitting: Family Medicine

## 2016-10-24 ENCOUNTER — Ambulatory Visit (INDEPENDENT_AMBULATORY_CARE_PROVIDER_SITE_OTHER): Payer: Medicare Other | Admitting: Family Medicine

## 2016-10-24 VITALS — BP 130/60 | HR 71 | Temp 98.4°F | Ht 66.0 in | Wt 286.0 lb

## 2016-10-24 DIAGNOSIS — Z09 Encounter for follow-up examination after completed treatment for conditions other than malignant neoplasm: Secondary | ICD-10-CM

## 2016-10-24 NOTE — Progress Notes (Deleted)
   Subjective:   Erin Esparza is a 76 y.o. female with a history of Breast cancer status post lumpectomy and radiation, PE/DVT, PAD CHF, HTN, venous insufficiency here for hospital follow-up for abdominal pain  ***  Patient is a manage the hospital 10/16/16 through 10/18/16 for abdominal pain.  Her CT abdomen and pelvis showed moderate ascites concerning for malignancy given her history of breast cancer in her age as well as findings suspicious for a resolving SBO or enteritis. She was started on ciprofloxacin and Flagyl and put on bowel rest. She is unable to obtain diagnostic paracentesis due to the lack of fluid pocket. Her GI symptoms resolved during admission and she is discharged home to finish a 7 day course of ciprofloxacin and Flagyl.    Review of Systems:  Per HPI.   Social History: *** smoker  Objective:  There were no vitals taken for this visit.  Gen:  76 y.o. female in NAD *** HEENT: NCAT, MMM, EOMI, PERRL, anicteric sclerae CV: RRR, no MRG, no JVD Resp: Non-labored, CTAB, no wheezes noted Abd: Soft, NTND, BS present, no guarding or organomegaly Ext: WWP, no edema MSK: Full ROM, strength intact Neuro: Alert and oriented, speech normal       Chemistry      Component Value Date/Time   NA 137 10/16/2016 2155   NA 142 12/25/2014 1122   K 3.8 10/16/2016 2155   K 4.1 12/25/2014 1122   CL 104 10/16/2016 2155   CL 102 10/04/2012 1159   CO2 22 10/16/2016 2155   CO2 29 12/25/2014 1122   BUN 15 10/16/2016 2155   BUN 13.7 12/25/2014 1122   CREATININE 1.03 (H) 10/16/2016 2155   CREATININE 0.96 (H) 02/27/2016 1530   CREATININE 0.9 12/25/2014 1122      Component Value Date/Time   CALCIUM 9.6 10/16/2016 2155   CALCIUM 10.1 12/25/2014 1122   ALKPHOS 74 10/16/2016 2155   ALKPHOS 99 12/25/2014 1122   AST 18 10/16/2016 2155   AST 21 12/25/2014 1122   ALT 14 10/16/2016 2155   ALT 17 12/25/2014 1122   BILITOT 0.4 10/16/2016 2155   BILITOT 0.41 12/25/2014 1122      Lab  Results  Component Value Date   WBC 10.1 10/18/2016   HGB 11.3 (L) 10/18/2016   HCT 35.6 (L) 10/18/2016   MCV 91.5 10/18/2016   PLT 245 10/18/2016   Lab Results  Component Value Date   TSH 3.440 10/18/2016   Lab Results  Component Value Date   HGBA1C 5.8 02/27/2016   Assessment & Plan:     Erin Esparza is a 76 y.o. female here for ***  No problem-specific Assessment & Plan notes found for this encounter.   Virginia Crews, MD MPH PGY-3,  [facility] 10/24/2016  10:37 AM

## 2016-10-24 NOTE — Patient Instructions (Signed)
It was a pleasure seeing you today, Erin Esparza.  Information regarding what we discussed is included in this packet.    Please feel free to call our office at [phone] if any questions or concerns arise.  Warm Regards, Ashly M. Lajuana Ripple, DO

## 2016-10-24 NOTE — Progress Notes (Signed)
   Subjective: CC: hospital follow up HPI:Erin Esparza is a 76 y.o. female presenting to clinic today for same day appointment. PCP: Virginia Crews, MD Concerns today include:  Patient was admitted to the hospital 10/16/16 through 10/18/16 for abdominal pain.  Her CT abdomen and pelvis showed moderate ascites concerning for malignancy given her history of breast cancer in her age as well as findings suspicious for a resolving SBO or enteritis. She was started on ciprofloxacin and Flagyl and put on bowel rest. She is unable to obtain diagnostic paracentesis due to the lack of fluid pocket. Her GI symptoms resolved during admission and she is discharged home to finish a 7 day course of ciprofloxacin and Flagyl.  She reports she finished abx on Wednesday.  She reports that she had nonbloody diarrhea for the last couple of days.  She reports intermittent feeling of fatigue but overall has been feeling ok since discharge.  She reports good PO intake.  Denies fevers, vomiting, dysuria, vaginal bleeding, vaginal discharge, hematuria, abdominal pain.   No Known Allergies  Social Hx reviewed. MedHx, current medications and allergies reviewed.  Please see EMR. ROS: Per HPI  Objective: Office vital signs reviewed. BP 130/60   Pulse 71   Temp 98.4 F (36.9 C) (Oral)   Ht 5\' 6"  (1.676 m)   Wt 286 lb (129.7 kg)   BMI 46.16 kg/m   Physical Examination:  General: Awake, alert, obese, No acute distress HEENT: Normal    Eyes: EOMI, sclera white    Throat: MMM  Cardio: regular rate and rhythm, S1S2 heard, no murmurs appreciated Pulm: clear to auscultation bilaterally, no wheezes, rhonchi or rales; normal work of breathing on room air GI: soft, non-tender, non-distended, bowel sounds present x4, no hepatomegaly, no splenomegaly, no masses Extremities: warm, +1 pitting edema to mid shins, no cyanosis or clubbing; +1 pulses bilaterally Skin: thickened, hyperpigmented skin to mid shins  Assessment/  Plan: 76 y.o. female   1. Hospital discharge follow-up.  Doing well since discharge for SBO.  Has completed abx and aside from diarrhea, she is asx.  Return precautions reviewed.  Follow up with PCP prn.  Janora Norlander, DO PGY-3, [facility]

## 2016-11-06 ENCOUNTER — Other Ambulatory Visit: Payer: Self-pay | Admitting: Family Medicine

## 2016-11-25 ENCOUNTER — Ambulatory Visit
Admission: RE | Admit: 2016-11-25 | Discharge: 2016-11-25 | Disposition: A | Discharge status: Home / Self Care | Payer: Medicare Other | Source: Ambulatory Visit | Attending: Hematology and Oncology | Admitting: Hematology and Oncology

## 2016-11-25 DIAGNOSIS — Z853 Personal history of malignant neoplasm of breast: Secondary | ICD-10-CM

## 2016-11-25 DIAGNOSIS — R928 Other abnormal and inconclusive findings on diagnostic imaging of breast: Secondary | ICD-10-CM | POA: Diagnosis not present

## 2016-11-27 ENCOUNTER — Other Ambulatory Visit: Payer: Self-pay | Admitting: Family Medicine

## 2016-12-26 ENCOUNTER — Ambulatory Visit: Payer: Medicare Other | Admitting: Hematology and Oncology

## 2017-01-01 ENCOUNTER — Encounter: Payer: Self-pay | Admitting: Hematology and Oncology

## 2017-01-01 ENCOUNTER — Ambulatory Visit (HOSPITAL_BASED_OUTPATIENT_CLINIC_OR_DEPARTMENT_OTHER): Payer: Medicare Other | Admitting: Hematology and Oncology

## 2017-01-01 DIAGNOSIS — Z7901 Long term (current) use of anticoagulants: Secondary | ICD-10-CM

## 2017-01-01 DIAGNOSIS — I2699 Other pulmonary embolism without acute cor pulmonale: Secondary | ICD-10-CM

## 2017-01-01 DIAGNOSIS — Z79811 Long term (current) use of aromatase inhibitors: Secondary | ICD-10-CM

## 2017-01-01 DIAGNOSIS — C50212 Malignant neoplasm of upper-inner quadrant of left female breast: Secondary | ICD-10-CM | POA: Diagnosis not present

## 2017-01-01 DIAGNOSIS — Z170 Estrogen receptor positive status [ER+]: Secondary | ICD-10-CM

## 2017-01-01 DIAGNOSIS — I8290 Acute embolism and thrombosis of unspecified vein: Secondary | ICD-10-CM

## 2017-01-01 NOTE — Assessment & Plan Note (Signed)
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 (DVT and PE diagnosed in 2012, currently on xarelto)  Arimidex toxicities: No major problems or concerns We discussed the role of extended adjuvant therapy. Because she has only microscopic invasive breast cancer I did not recommend continuing beyond 5 years. Patient appeared to be nervous about stopping Arimidex therapy in November 2018. We will discuss this once again with her next year when she comes back to see [place].  DVT and PE: takes xarelto  Breast Cancer Surveillance: 1. Breast exam 01/01/17: Normal 2. Mammogram  11/27/2016 No abnormalities. Postsurgical changes. Breast Density Category B. 3. Bone density 11/14/2014: Normal T score 1.8  Return to clinic once a year for follow-up

## 2017-01-01 NOTE — Progress Notes (Signed)
Patient Care Team: Lovenia Kim, MD as PCP - General Marcy Panning, MD (Oncology) [name], MD as Consulting Physician (Ophthalmology) [name], DPM as Consulting Physician (Podiatry)  DIAGNOSIS:  Encounter Diagnosis  Name Primary?  . Primary cancer of upper inner quadrant of left female breast ([place])     SUMMARY OF ONCOLOGIC HISTORY:   Primary cancer of upper inner quadrant of left female breast ([place])   10/23/2011 Mammogram    Suspicious mass at 10:00 position 7 cm from the left nipple. Ultrasound irregular hypoechoic mass 7 x 6 x 5 mm      01/06/2012 Initial Biopsy    Initial biopsy was benign . 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 DVT and PE diagnosed in 2012 now on Xarelto       CHIEF COMPLIANT: Follow-up on anastrozole therapy  INTERVAL HISTORY: Erin Esparza is a 76 year old with above-mentioned history of left breast cancer who is currently on anastrozole therapy and is tolerating it extremely well. She does have arthritic symptoms but otherwise able to stay fairly active. She has been traveling and to [place] and her son has also been started her. She denies any lumps or nodules in the breast. Her mammograms done in June were normal.  REVIEW OF SYSTEMS:   Constitutional: Denies fevers, chills or abnormal weight loss Eyes: Denies blurriness of vision Ears, nose, mouth, throat, and face: Denies mucositis or sore throat Respiratory: Denies cough, dyspnea or wheezes Cardiovascular: Denies palpitation, chest discomfort Gastrointestinal:  Denies nausea,  heartburn or change in bowel habits Skin: Denies abnormal skin rashes Lymphatics: Denies new lymphadenopathy or easy bruising Neurological:Denies numbness, tingling or new weaknesses Behavioral/Psych: Mood is stable, no new changes  Extremities: No lower extremity edema Breast:  denies any pain or lumps or nodules in either breasts All other systems were reviewed with the patient and are negative.  I have reviewed the past medical history, past surgical history, social history and family history with the patient and they are unchanged from previous note.  ALLERGIES:  has No Known Allergies.  MEDICATIONS:  Current Outpatient Prescriptions  Medication Sig Dispense Refill  . acetaminophen (TYLENOL) 500 MG tablet Take 1 tablet (500 mg total) by mouth every 6 (six) hours as needed. (Patient taking differently: Take 500 mg by mouth every 6 (six) hours as needed for moderate pain. ) 30 tablet 3  . ADVAIR DISKUS 100-50 MCG/DOSE AEPB INHALE 1 PUFF BY MOUTH TWICE DAILY AS DIRECTED (Patient not taking: Reported on 10/17/2016) 60 each 3  . ADVAIR DISKUS 100-50 MCG/DOSE AEPB INHALE 1 PUFF BY MOUTH TWICE DAILY AS DIRECTED 60 each 1  . anastrozole (ARIMIDEX) 1 MG tablet TAKE 1 TABLET(1 MG) BY MOUTH DAILY 90 tablet 3  . Calcium Carbonate (CALTRATE 600 PO) Take 1 tablet by mouth 2 (two) times daily.     . enalapril (VASOTEC) 20 MG tablet TAKE 1 TABLET BY MOUTH TWICE DAILY 180 tablet 0  . furosemide (LASIX) 40 MG tablet TAKE 1 TABLET BY MOUTH  DAILY AS NEEDED FOR LEG SWELLING 90 tablet 0  . hydrochlorothiazide (HYDRODIURIL) 25 MG tablet TAKE 1 TABLET BY MOUTH DAILY 90 tablet 2  . Multiple Vitamin (MULTIVITAMIN) tablet Take 1 tablet by mouth daily.    . Multiple Vitamins-Minerals (ICAPS AREDS 2 PO) Take 1 capsule by mouth 2 (two) times daily.    . Multiple Vitamins-Minerals (WOMENS BONE HEALTH PO) Take 1 tablet by mouth daily.    Alveda Reasons 20 MG TABS tablet TAKE 1 TABLET BY MOUTH DAILY 90 tablet 0   No  current facility-administered medications for this visit.     PHYSICAL EXAMINATION: ECOG PERFORMANCE STATUS: 1 - Symptomatic but completely ambulatory  Vitals:   01/01/17 1001  BP: (!) 141/55  Pulse: 64  Resp: 18  Temp: 98.1 F (36.7 C)   Filed Weights   01/01/17 1001  Weight: 289 lb 4.8 oz (131.2 kg)    GENERAL:alert, no distress and comfortable SKIN: skin color, texture, turgor are normal, no rashes or significant lesions EYES: normal, Conjunctiva are pink and non-injected, sclera clear OROPHARYNX:no exudate, no erythema and lips, buccal mucosa, and tongue normal  NECK: supple, thyroid normal size, non-tender, without nodularity LYMPH:  no palpable lymphadenopathy in the cervical, axillary or inguinal LUNGS: clear to auscultation and percussion with normal breathing effort HEART: regular rate & rhythm and no murmurs and no lower extremity edema ABDOMEN:abdomen soft, non-tender and normal bowel sounds MUSCULOSKELETAL:no cyanosis of digits and no clubbing  NEURO: alert & oriented x 3 with fluent speech, no focal motor/sensory deficits EXTREMITIES: No lower extremity edema  LABORATORY DATA:  I have reviewed the data as listed   Chemistry      Component Value Date/Time   NA 137 10/16/2016 2155   NA 142 12/25/2014 1122   K 3.8 10/16/2016 2155   K 4.1 12/25/2014 1122   CL 104 10/16/2016 2155   CL 102 10/04/2012 1159   CO2 22 10/16/2016 2155   CO2 29 12/25/2014 1122   BUN 15 10/16/2016 2155   BUN 13.7 12/25/2014 1122   CREATININE 1.03 (H) 10/16/2016 2155   CREATININE 0.96 (H) 02/27/2016 1530   CREATININE 0.9 12/25/2014 1122      Component Value Date/Time   CALCIUM 9.6 10/16/2016 2155   CALCIUM 10.1 12/25/2014 1122   ALKPHOS 74 10/16/2016 2155   ALKPHOS 99 12/25/2014 1122   AST 18 10/16/2016 2155   AST 21 12/25/2014 1122   ALT 14 10/16/2016 2155   ALT 17 12/25/2014 1122   BILITOT 0.4 10/16/2016 2155   BILITOT 0.41 12/25/2014 1122       Lab Results    Component Value Date   WBC 10.1 10/18/2016   HGB 11.3 (L) 10/18/2016   HCT 35.6 (L) 10/18/2016   MCV 91.5 10/18/2016   PLT 245 10/18/2016   NEUTROABS 10.2 (H) 10/16/2016    ASSESSMENT & PLAN:  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 (DVT and PE diagnosed in 2012, currently on xarelto)  Arimidex toxicities: No major problems or concerns We discussed the role of extended adjuvant therapy. Because she has only microscopic invasive breast cancer I did not recommend continuing beyond 5 years. Patient appeared to be nervous about stopping Arimidex therapy in November 2018. We will discuss this once again with her next year when she comes back to see  [place].  DVT and PE: takes xarelto  Breast Cancer Surveillance: 1. Breast exam 01/01/17: Normal 2. Mammogram  11/27/2016 No abnormalities. Postsurgical changes. Breast Density Category B. 3. Bone density 11/14/2014: Normal T score 1.8  Return to clinic once a year for follow-up  I spent 15 minutes talking to the patient of which more than half was spent in counseling and coordination of care.  No orders of the defined types were placed in this encounter.  The patient has a good understanding of the overall plan. she agrees with it. she will call with any problems that may develop before the next visit here.   Rulon Eisenmenger, MD 01/01/17

## 2017-02-05 ENCOUNTER — Other Ambulatory Visit: Payer: Self-pay | Admitting: Family Medicine

## 2017-02-05 NOTE — Telephone Encounter (Signed)
Will route to new PCP

## 2017-02-11 ENCOUNTER — Other Ambulatory Visit: Payer: Self-pay | Admitting: Family Medicine

## 2017-02-25 ENCOUNTER — Other Ambulatory Visit: Payer: Self-pay | Admitting: Family Medicine

## 2017-02-25 ENCOUNTER — Other Ambulatory Visit: Payer: Self-pay | Admitting: Hematology and Oncology

## 2017-02-25 DIAGNOSIS — C50212 Malignant neoplasm of upper-inner quadrant of left female breast: Secondary | ICD-10-CM

## 2017-02-25 NOTE — Telephone Encounter (Signed)
Will forward to new PCP  Bacigalupo, Dionne Bucy, MD, MPH [facility] 02/25/2017 10:27 AM

## 2017-03-03 ENCOUNTER — Encounter: Payer: Self-pay | Admitting: Family Medicine

## 2017-03-03 ENCOUNTER — Ambulatory Visit (INDEPENDENT_AMBULATORY_CARE_PROVIDER_SITE_OTHER): Payer: Medicare Other | Admitting: Family Medicine

## 2017-03-03 DIAGNOSIS — Z8672 Personal history of thrombophlebitis: Secondary | ICD-10-CM

## 2017-03-03 DIAGNOSIS — C50212 Malignant neoplasm of upper-inner quadrant of left female breast: Secondary | ICD-10-CM | POA: Diagnosis not present

## 2017-03-03 DIAGNOSIS — Z23 Encounter for immunization: Secondary | ICD-10-CM | POA: Diagnosis not present

## 2017-03-03 MED ORDER — TETANUS-DIPHTH-ACELL PERTUSSIS 5-2-15.5 LF-MCG/0.5 IM SUSP: 0.5000 mL | Freq: Once | INTRAMUSCULAR | 0 refills | Status: AC

## 2017-03-03 NOTE — Patient Instructions (Signed)
It was nice meeting you today! You were seen in clinic for a physical checkup and your flu shot.  You will need to follow up in 1 year or sooner if needed.   Your tetanus/tdap vaccination prescription can be taken to a pharmacy for you to receive the injection.  Please call clinic with any questions.   Be well, Erin Kim, MD

## 2017-03-03 NOTE — Progress Notes (Signed)
   Subjective:   Patient ID: Erin Esparza    DOB: 05-12-1941, 76 y.o. female   MRN: [id number]  CC:  Meet pcp   HPI: Erin Esparza is a 76 y.o. female who presents to clinic today to meet pcp.   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 (DVT and PE diagnosed in 2012, currently on xarelto) -management at [facility] cancer center.  Last check-up in August and doing well.   -sister also with breast cancert -Surveillance:  1. Breast exam 01/01/17: Normal  2. Mammogram 06/28/2018No abnormalities. Postsurgical changes. Breast Density Category B. 3. Bone density 11/14/2014: Normal T score 1.8  She has not noted any new lumps or changes in breast tissue.  Oncologist told her to stop taking anastrozole once she had run out it as she only had microscopic invasive breast cancer, she was recommended to not take it beyond 5 years.   She has about 1-2 weeks left of this medication.   -DVT and PE: Xarelto therapy.  Reports good compliance.   -She is to follow up with Oncology again in 1 year.     Health Maintenance:  -due for flu shot -due for tetanus shot   ROS: No fevers, chills, nausea, vomiting, diarrhea.  No chest pain, palpitations or shortness of breath.    [place]: Pertinent past medical, surgical, family, and social history were reviewed and updated as appropriate. Smoking status reviewed. Medications reviewed.  Objective:   BP 128/62   Pulse 81   Temp 98.4 F (36.9 C) (Oral)   Ht _0  (1.676 m)   Wt 290 lb (131.5 kg)   SpO2 98%   BMI 46.81 kg/m  Vitals and nursing note reviewed.  General: NAD, able to participate in exam Cardiac: RRR, normal heart sounds, no murmurs  Respiratory: CTAB, normal effort, No wheezes, rales or rhonchi Abdomen: soft, nontender, nondistended,+bs  Extremities:no edema or cyanosis.  WWP. Skin:warm and dry, no rashes noted Neuro: alert and oriented x4, no focal deficits Psych:Normal affect and mood  Assessment & Plan:   DEEP VENOUS THROMBOPHLEBITIS, HX OF On Xarelto, reports good compliance.   Primary cancer of upper inner quadrant of left female breast Recent breast cancer surveillance negative.  Follows at [facility] cancer center.  On Xarelto for h/o DVT.   -follow up in 1 year with onc  Health Maintenance: -tetanus and flu shot provided this visit   Orders Placed This Encounter  Procedures  . Flu Vaccine QUAD 36+ mos IM   Meds ordered this encounter  Medications  . Tdap (ADACEL) 10-01-13.5 LF-MCG/0.5 injection    Sig: Inject 0.5 mLs into the muscle once.    Dispense:  0.5 mL    Refill:  0   Follow up: PRN   Lovenia Kim, MD [place], PGY-2 03/06/2017 12:12 PM

## 2017-03-04 ENCOUNTER — Telehealth: Payer: Self-pay | Admitting: Family Medicine

## 2017-03-04 NOTE — Telephone Encounter (Signed)
Pt would like to talk to a manager about her visit yesterday. She was invasive about what it was about

## 2017-03-06 ENCOUNTER — Other Ambulatory Visit: Payer: Self-pay | Admitting: Family Medicine

## 2017-03-06 NOTE — Assessment & Plan Note (Signed)
On Xarelto, reports good compliance.

## 2017-03-06 NOTE — Assessment & Plan Note (Signed)
Recent breast cancer surveillance negative.  Follows at [facility] cancer center.  On Xarelto for h/o DVT.   -follow up in 1 year with onc

## 2017-03-10 NOTE — Telephone Encounter (Signed)
Did we find out what it was that she wanted to discuss?

## 2017-03-12 ENCOUNTER — Encounter (HOSPITAL_COMMUNITY): Payer: Self-pay | Admitting: *Deleted

## 2017-03-12 ENCOUNTER — Emergency Department (HOSPITAL_COMMUNITY): Payer: Medicare Other

## 2017-03-12 ENCOUNTER — Emergency Department (HOSPITAL_COMMUNITY)
Admission: EM | Admit: 2017-03-12 | Discharge: 2017-03-12 | Disposition: A | Discharge status: Home / Self Care | Payer: Medicare Other | Attending: Emergency Medicine | Admitting: Emergency Medicine

## 2017-03-12 DIAGNOSIS — Z87891 Personal history of nicotine dependence: Secondary | ICD-10-CM | POA: Diagnosis not present

## 2017-03-12 DIAGNOSIS — Z79899 Other long term (current) drug therapy: Secondary | ICD-10-CM | POA: Diagnosis not present

## 2017-03-12 DIAGNOSIS — Z853 Personal history of malignant neoplasm of breast: Secondary | ICD-10-CM | POA: Diagnosis not present

## 2017-03-12 DIAGNOSIS — R9431 Abnormal electrocardiogram [ECG] [EKG]: Secondary | ICD-10-CM | POA: Diagnosis not present

## 2017-03-12 DIAGNOSIS — R1084 Generalized abdominal pain: Secondary | ICD-10-CM

## 2017-03-12 DIAGNOSIS — I110 Hypertensive heart disease with heart failure: Secondary | ICD-10-CM | POA: Diagnosis not present

## 2017-03-12 DIAGNOSIS — R109 Unspecified abdominal pain: Secondary | ICD-10-CM | POA: Diagnosis not present

## 2017-03-12 DIAGNOSIS — R531 Weakness: Secondary | ICD-10-CM | POA: Diagnosis not present

## 2017-03-12 DIAGNOSIS — I5030 Unspecified diastolic (congestive) heart failure: Secondary | ICD-10-CM | POA: Diagnosis not present

## 2017-03-12 DIAGNOSIS — J45909 Unspecified asthma, uncomplicated: Secondary | ICD-10-CM | POA: Diagnosis not present

## 2017-03-12 LAB — COMPREHENSIVE METABOLIC PANEL
ALT: 13 U/L — ABNORMAL LOW (ref 14–54)
AST: 22 U/L (ref 15–41)
Albumin: 3.3 g/dL — ABNORMAL LOW (ref 3.5–5.0)
Alkaline Phosphatase: 78 U/L (ref 38–126)
Anion gap: 9 (ref 5–15)
BUN: 14 mg/dL (ref 6–20)
CO2: 26 mmol/L (ref 22–32)
Calcium: 9.4 mg/dL (ref 8.9–10.3)
Chloride: 98 mmol/L — ABNORMAL LOW (ref 101–111)
Creatinine, Ser: 0.96 mg/dL (ref 0.44–1.00)
GFR calc Af Amer: >60 mL/min (ref >60)
GFR calc non Af Amer: 56 mL/min — ABNORMAL LOW (ref >60)
Glucose, Bld: 135 mg/dL — ABNORMAL HIGH (ref 65–99)
Potassium: 3.9 mmol/L (ref 3.5–5.1)
Sodium: 133 mmol/L — ABNORMAL LOW (ref 135–145)
Total Bilirubin: 0.6 mg/dL (ref 0.3–1.2)
Total Protein: 8.2 g/dL — ABNORMAL HIGH (ref 6.5–8.1)

## 2017-03-12 LAB — CBC
HCT: 33.8 % — ABNORMAL LOW (ref 36.0–46.0)
Hemoglobin: 11 g/dL — ABNORMAL LOW (ref 12.0–15.0)
MCH: 29.6 pg (ref 26.0–34.0)
MCHC: 32.5 g/dL (ref 30.0–36.0)
MCV: 91.1 fL (ref 78.0–100.0)
Platelets: 240 10*3/uL (ref 150–400)
RBC: 3.71 MIL/uL — ABNORMAL LOW (ref 3.87–5.11)
RDW: 13.4 % (ref 11.5–15.5)
WBC: 6.5 10*3/uL (ref 4.0–10.5)

## 2017-03-12 LAB — URINALYSIS, ROUTINE W REFLEX MICROSCOPIC
Bacteria, UA: NONE SEEN
Bilirubin Urine: NEGATIVE
Glucose, UA: NEGATIVE mg/dL
Hgb urine dipstick: NEGATIVE
Ketones, ur: NEGATIVE mg/dL
Leukocytes, UA: NEGATIVE
Nitrite: NEGATIVE
Protein, ur: 30 mg/dL — AB
Specific Gravity, Urine: 1.033 — ABNORMAL HIGH (ref 1.005–1.030)
pH: 6 (ref 5.0–8.0)

## 2017-03-12 LAB — TROPONIN I: Troponin I: <0.03 ng/mL (ref <0.03)

## 2017-03-12 LAB — LIPASE, BLOOD: Lipase: 19 U/L (ref 11–51)

## 2017-03-12 MED ORDER — ACETAMINOPHEN 325 MG PO TABS
650.0000 mg | ORAL_TABLET | Freq: Once | ORAL | Status: AC
  Administered 2017-03-12: 650 mg via ORAL
  Filled 2017-03-12: qty 2

## 2017-03-12 MED ORDER — FENTANYL CITRATE (PF) 100 MCG/2ML IJ SOLN
100.0000 ug | Freq: Once | INTRAMUSCULAR | Status: AC
  Administered 2017-03-12: 100 ug via INTRAVENOUS
  Filled 2017-03-12: qty 2

## 2017-03-12 MED ORDER — IOPAMIDOL (ISOVUE-300) INJECTION 61%
INTRAVENOUS | Status: AC
  Administered 2017-03-12: 100 mL
  Filled 2017-03-12: qty 100

## 2017-03-12 NOTE — ED Notes (Signed)
Patient transported to X-ray and then CT 

## 2017-03-12 NOTE — ED Provider Notes (Signed)
[facility] DEPT Provider Note   CSN: [id number] Arrival date & time: 03/12/17  0844     History   Chief Complaint Chief Complaint  Patient presents with  . Weakness  . Abdominal Pain    HPI Erin Esparza is a 76 y.o. female.  HPI  76 year old female resents with abdominal pain. She states that she didn't quite feel well last night and went to bed. She woke up at 3 AM and felt weak and "drained". This has since resolved. However she's also been feeling diffusely abdominal discomfort. At first she thought she needed to go to the bathroom However she has had a couple small bowel movements without any relief. She was constipated a couple days ago but now seems back more towards her normal. She was nauseated earlier but that has resolved. No vomiting or abdominal distention. Abdominal pain is diffuse. Typically when she is sitting in a certain position she will become more more uncomfortable and have to switch positions. No urinary symptoms. No chest pain or shortness of breath.  Past Medical History:  Diagnosis Date  . Arthritis    "both feet" (10/17/2016)  . Asthma   . Breast cancer ([place]) 01/06/12   left breast invasive ductal ca,dcis,ER/PR=+,  . Breast mass in female   . Clotting disorder ([place])   . Diastolic congestive heart failure ([place])   . DVT (deep venous thrombosis) ([place]) 1990's?   LLE  . Hypertension   . Insulin resistance   . Pulmonary embolism ([place]) ~ 2012  . Radiation 02/12/12 -03/12/12   left breast, total 50gy  . Right ankle pain   . Shortness of breath   . Venous stasis of lower extremity   . Wears dentures    upper  . Wears glasses   . Wears partial dentures    lower    Patient Active Problem List   Diagnosis Date Noted  . Abdominal pain 10/17/2016  . Enteritis   . Other ascites   . Urinary tract infection without hematuria   . Left upper arm pain 10/14/2016  . Prediabetes 02/27/2016  . Plantar fasciitis of right foot 02/27/2014  . [place] arthritis  06/15/2013  . Pain of skin 02/18/2013  . Paresthesia of right leg 09/30/2012  . Venous stasis of lower extremity   . Preventative health care 03/05/2012  . Primary cancer of upper inner quadrant of left female breast ([place]) 01/20/2012  . 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 Breast cancer ([place]) 01/06/2012  . Leg swelling 04/09/2011  . Pulmonary embolism ([place]) 03/26/2011  . 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 STASIS DERMATITIS, CHRONIC 10/25/2009  . DEEP VENOUS THROMBOPHLEBITIS, HX OF 07/28/2008  . OBESITY 01/27/2008  . HYPERTENSION, BENIGN ESSENTIAL 09/24/2006  . ASTHMA, PERSISTENT 09/24/2006  . DEGENERATIVE JOINT DISEASE, KNEE 09/04/2006    Past Surgical History:  Procedure Laterality Date  . BREAST BIOPSY Left 11/03/11   10 o'clock=benign breast parenchyma  . BREAST LUMPECTOMY Left 01/06/12    Dr. Marcello Moores [place]    OB History    No data available       Home Medications    Prior to Admission medications   Medication Sig Start Date End Date Taking? Authorizing Provider  acetaminophen (TYLENOL) 500 MG tablet Take 1 tablet (500 mg total) by mouth every 6 (six) hours as needed. Patient taking differently: Take 500 mg by mouth every 6 (six) hours as needed for moderate pain.  08/25/12  Yes Coral Spikes, DO  ADVAIR  DISKUS 100-50 MCG/DOSE AEPB INHALE 1 PUFF BY MOUTH TWICE DAILY AS DIRECTED 03/04/16  Yes Bacigalupo, Dionne Bucy, MD  anastrozole (ARIMIDEX) 1 MG tablet TAKE 1 TABLET(1 MG) BY MOUTH DAILY 02/25/17  Yes Nicholas Lose, MD  Calcium Carbonate (CALTRATE 600 PO) Take 1 tablet by mouth 2 (two) times daily.    Yes [provider]  enalapril (VASOTEC) 20 MG tablet TAKE 1 TABLET BY MOUTH TWICE DAILY 02/05/17  Yes Lovenia Kim, MD  hydrochlorothiazide (HYDRODIURIL) 25 MG tablet TAKE 1 TABLET BY MOUTH DAILY 02/25/17  Yes Lovenia Kim, MD  Multiple Vitamin (MULTIVITAMIN) tablet Take 1 tablet by mouth daily.   Yes [provider]  Multiple Vitamins-Minerals (ICAPS  AREDS 2 PO) Take 1 capsule by mouth 2 (two) times daily.   Yes [provider]  Multiple Vitamins-Minerals (WOMENS BONE HEALTH PO) Take 1 tablet by mouth daily.   Yes [provider]  XARELTO 20 MG TABS tablet TAKE 1 TABLET BY MOUTH DAILY 02/11/17  Yes Yoo, Elsia J, DO  ADVAIR DISKUS 100-50 MCG/DOSE AEPB INHALE 1 PUFF BY MOUTH TWICE DAILY AS DIRECTED 03/12/17   Lovenia Kim, MD  furosemide (LASIX) 40 MG tablet TAKE 1 TABLET BY MOUTH DAILY AS NEEDED FOR LEG SWELLING 10/20/16   Bacigalupo, Dionne Bucy, MD    Family History Family History  Problem Relation Age of Onset  . Heart disease Mother   . Heart attack Mother   . Cancer Sister        Breast  . Parkinson's disease Brother   . Pulmonary embolism Daughter   . Diabetes Daughter   . Pulmonary embolism Daughter     Social History Social History  Substance Use Topics  . Smoking status: Former Smoker    Packs/day: 0.30    Years: 15.00    Types: Cigarettes  . Smokeless tobacco: Never Used     Comment: "stopped in the 1990s"  . Alcohol use No     Allergies   Patient has no known allergies.   Review of Systems Review of Systems  Constitutional: Negative for fever.  Respiratory: Negative for shortness of breath.   Cardiovascular: Negative for chest pain.  Gastrointestinal: Positive for abdominal pain, constipation and nausea. Negative for abdominal distention, blood in stool and vomiting.  Genitourinary: Negative for dysuria.  All other systems reviewed and are negative.    Physical Exam Updated Vital Signs BP 136/76   Pulse 94   Temp 98.7 F (37.1 C) (Oral)   Resp 18   Ht 5\' 6"  (1.676 m)   Wt 131.5 kg (290 lb)   SpO2 99%   BMI 46.81 kg/m   Physical Exam  Constitutional: She is oriented to person, place, and time. She appears well-developed and well-nourished.  HENT:  Head: Normocephalic and atraumatic.  Right Ear: External ear normal.  Left Ear: External ear normal.  Nose: Nose normal.  Eyes:  Right eye exhibits no discharge. Left eye exhibits no discharge.  Cardiovascular: Normal rate, regular rhythm and normal heart sounds.   Pulmonary/Chest: Effort normal and breath sounds normal.  Abdominal: Soft. She exhibits no distension. There is no tenderness.  Neurological: She is alert and oriented to person, place, and time.  Skin: Skin is warm and dry. She is not diaphoretic.  Nursing note and vitals reviewed.    ED Treatments / Results  Labs (all labs ordered are listed, but only abnormal results are displayed) Labs Reviewed  URINALYSIS, ROUTINE W REFLEX MICROSCOPIC - Abnormal; Notable for the following:  Result Value   Specific Gravity, Urine 1.033 (*)    Protein, ur 30 (*)    Squamous Epithelial / LPF 0-5 (*)    All other components within normal limits  COMPREHENSIVE METABOLIC PANEL - Abnormal; Notable for the following:    Sodium 133 (*)    Chloride 98 (*)    Glucose, Bld 135 (*)    Total Protein 8.2 (*)    Albumin 3.3 (*)    ALT 13 (*)    GFR calc non Af Amer 56 (*)    All other components within normal limits  CBC - Abnormal; Notable for the following:    RBC 3.71 (*)    Hemoglobin 11.0 (*)    HCT 33.8 (*)    All other components within normal limits  LIPASE, BLOOD  TROPONIN I  CBG MONITORING, ED    EKG  EKG Interpretation  Date/Time:  Thursday March 12 2017 09:20:53 EDT Ventricular Rate:  92 PR Interval:  178 QRS Duration: 126 QT Interval:  390 QTC Calculation: 482 R Axis:   -25 Text Interpretation:  Normal sinus rhythm Left ventricular hypertrophy with QRS widening Abnormal ECG no significant change since May 2018 Confirmed by Sherwood Gambler [phone]) on 03/12/2017 11:32:04 AM       Radiology Dg Chest 2 View  Result Date: 03/12/2017 CLINICAL DATA:  Weakness for 1 week EXAM: CHEST  2 VIEW COMPARISON:  01/02/2012 FINDINGS: Accentuated heart size in the setting of low volumes. Negative aortic and hilar contours. Interstitial crowding. There  is no edema, consolidation, effusion, or pneumothorax. IMPRESSION: No evidence of active disease. Electronically Signed   By: Monte Fantasia M.D.   On: 03/12/2017 13:31   Ct Abdomen Pelvis W Contrast  Result Date: 03/12/2017 CLINICAL DATA:  Diffuse abdominal pain for 2 days.  Nausea. EXAM: CT ABDOMEN AND PELVIS WITH CONTRAST TECHNIQUE: Multidetector CT imaging of the abdomen and pelvis was performed using the standard protocol following bolus administration of intravenous contrast. CONTRAST:  138mL ISOVUE-300 IOPAMIDOL (ISOVUE-300) INJECTION 61% COMPARISON:  10/16/2016 FINDINGS: Lower chest: Minimal atelectasis in the lung bases. No pleural effusion. Hepatobiliary: Unchanged small low-density liver lesions measuring up to 9 mm in size, too small to fully characterize. Unremarkable gallbladder. No biliary dilatation. Pancreas: Unremarkable. Spleen: Unremarkable. Adrenals/Urinary Tract: Unremarkable adrenal glands. Unchanged 2.2 cm left upper pole renal cyst. Unchanged subcentimeter low-density lesion in the lower pole of the left kidney, too small to fully characterize. No evidence of renal calculi or hydronephrosis. Unremarkable bladder. Stomach/Bowel: Likely tiny sliding hiatal hernia with mild distal esophageal wall thickening. No evidence of bowel obstruction or inflammation. Unremarkable appendix. Vascular/Lymphatic: Abdominal aortic atherosclerosis without aneurysm. An 11 mm short axis left external iliac lymph node is unchanged. A 1.5 cm right external iliac lymph node has enlarged. A 9 mm right common iliac chain lymph node has also enlarged. Reproductive: Uterus and bilateral adnexa are unremarkable. Other: Fat-containing right inguinal hernia with a small amount of fluid, decreased from prior. No residual ascites elsewhere in the abdomen or pelvis. Musculoskeletal: Disc and advanced facet degeneration at L5-S1. IMPRESSION: 1. No definite acute abnormality identified in the abdomen or pelvis. 2. Mild  pelvic lymphadenopathy, nonspecific. 3. Mild distal esophageal wall thickening which may reflect reflux esophagitis given the possible tiny hiatal hernia. 4. Right inguinal hernia containing fat and a small amount of fluid. 5.  Aortic Atherosclerosis (ICD10-I70.0). Electronically Signed   By: Logan Bores M.D.   On: 03/12/2017 13:52    Procedures Procedures (including  critical care time)  Medications Ordered in ED Medications  fentaNYL (SUBLIMAZE) injection 100 mcg (100 mcg Intravenous Given 03/12/17 1303)  iopamidol (ISOVUE-300) 61 % injection (100 mLs  Contrast Given 03/12/17 1322)  acetaminophen (TYLENOL) tablet 650 mg (650 mg Oral Given 03/12/17 1520)     Initial Impression / Assessment and Plan / ED Course  I have reviewed the triage vital signs and the nursing notes.  Pertinent labs & imaging results that were available during my care of the patient were reviewed by me and considered in my medical decision making (see chart for details).     No clear cause for the patient's abdominal pain. She does not have any acute chest pain or shortness of breath either. Her weakness is no longer present and was briefly present during the middle the night. At this point I do not find any emergent or concerning findings on workup now. Think she is stable for discharge home to follow-up with her PCP. Discussed outpatient constipation management. Discussed return precautions.   Final Clinical Impressions(s) / ED Diagnoses   Final diagnoses:  Generalized abdominal pain    New Prescriptions Discharge Medication List as of 03/12/2017  3:14 PM       Sherwood Gambler, MD 03/12/17 1704

## 2017-03-12 NOTE — ED Notes (Signed)
Got patient back on the monitor and urine sample patient is resting with family at bedside and call bell in reach

## 2017-03-12 NOTE — ED Triage Notes (Signed)
Pt is here with weakness and upper abdominal pain.  Pt reports nausea and constipation.  Pt family thinks she was sob this am but patient does not.  Pt is on Xarelto for PE.  No chest pain

## 2017-03-12 NOTE — ED Notes (Signed)
Patient unable to sign for discharge at this time; pt verbalizes understanding of discharge instructions. Opportunity for questioning and answers were provided.

## 2017-04-07 ENCOUNTER — Encounter: Payer: Self-pay | Admitting: Family Medicine

## 2017-04-07 ENCOUNTER — Ambulatory Visit (INDEPENDENT_AMBULATORY_CARE_PROVIDER_SITE_OTHER): Payer: Medicare Other | Admitting: Family Medicine

## 2017-04-07 ENCOUNTER — Other Ambulatory Visit: Payer: Self-pay

## 2017-04-07 DIAGNOSIS — R531 Weakness: Secondary | ICD-10-CM

## 2017-04-07 NOTE — Patient Instructions (Signed)
You were seen in clinic for a follow up after your ED visit and I am glad you are feeling better!  Your weakness may have been due to dehydration so it is important that you continue to eat and drink plenty of fluids.  I have included some information below for you to read if you'd like.  If you have any new or worsening symptoms you will need to be seen by a provider.   Be well,  Lovenia Kim, MD   Dehydration, Adult Dehydration is when there is not enough fluid or water in your body. This happens when you lose more fluids than you take in. Dehydration can range from mild to very bad. It should be treated right away to keep it from getting very bad. Symptoms of mild dehydration may include:  Thirst.  Dry lips.  Slightly dry mouth.  Dry, warm skin.  Dizziness. Symptoms of moderate dehydration may include:  Very dry mouth.  Muscle cramps.  Dark pee (urine). Pee may be the color of tea.  Your body making less pee.  Your eyes making fewer tears.  Heartbeat that is uneven or faster than normal (palpitations).  Headache.  Light-headedness, especially when you stand up from sitting.  Fainting (syncope). Symptoms of very bad dehydration may include:  Changes in skin, such as: ? Cold and clammy skin. ? Blotchy (mottled) or pale skin. ? Skin that does not quickly return to normal after being lightly pinched and let go (poor skin turgor).  Changes in body fluids, such as: ? Feeling very thirsty. ? Your eyes making fewer tears. ? Not sweating when body temperature is high, such as in hot weather. ? Your body making very little pee.  Changes in vital signs, such as: ? Weak pulse. ? Pulse that is more than 100 beats a minute when you are sitting still. ? Fast breathing. ? Low blood pressure.  Other changes, such as: ? Sunken eyes. ? Cold hands and feet. ? Confusion. ? Lack of energy (lethargy). ? Trouble waking up from sleep. ? Short-term weight  loss. ? Unconsciousness. Follow these instructions at home:  If told by your doctor, drink an ORS: ? Make an ORS by using instructions on the package. ? Start by drinking small amounts, about  cup (120 mL) every 5-10 minutes. ? Slowly drink more until you have had the amount that your doctor said to have.  Drink enough clear fluid to keep your pee clear or pale yellow. If you were told to drink an ORS, finish the ORS first, then start slowly drinking clear fluids. Drink fluids such as: ? Water. Do not drink only water by itself. Doing that can make the salt (sodium) level in your body get too low (hyponatremia). ? Ice chips. ? Fruit juice that you have added water to (diluted). ? Low-calorie sports drinks.  Avoid: ? Alcohol. ? Drinks that have a lot of sugar. These include high-calorie sports drinks, fruit juice that does not have water added, and soda. ? Caffeine. ? Foods that are greasy or have a lot of fat or sugar.  Take over-the-counter and prescription medicines only as told by your doctor.  Do not take salt tablets. Doing that can make the salt level in your body get too high (hypernatremia).  Eat foods that have minerals (electrolytes). Examples include bananas, oranges, potatoes, tomatoes, and spinach.  Keep all follow-up visits as told by your doctor. This is important. Contact a doctor if:  You have belly (abdominal)  pain that: ? Gets worse. ? Stays in one area (localizes).  You have a rash.  You have a stiff neck.  You get angry or annoyed more easily than normal (irritability).  You are more sleepy than normal.  You have a harder time waking up than normal.  You feel: ? Weak. ? Dizzy. ? Very thirsty.  You have peed (urinated) only a small amount of very dark pee during 6-8 hours. Get help right away if:  You have symptoms of very bad dehydration.  You cannot drink fluids without throwing up (vomiting).  Your symptoms get worse with  treatment.  You have a fever.  You have a very bad headache.  You are throwing up or having watery poop (diarrhea) and it: ? Gets worse. ? Does not go away.  You have blood or something green (bile) in your throw-up.  You have blood in your poop (stool). This may cause poop to look black and tarry.  You have not peed in 6-8 hours.  You pass out (faint).  Your heart rate when you are sitting still is more than 100 beats a minute.  You have trouble breathing. This information is not intended to replace advice given to you by your health care provider. Make sure you discuss any questions you have with your health care provider. Document Released: 03/15/2009 Document Revised: 12/07/2015 Document Reviewed: 07/13/2015 Elsevier Interactive Patient Education  2018 [employer].

## 2017-04-07 NOTE — Progress Notes (Signed)
   Subjective:   Patient ID: Erin Esparza    DOB: 1940-11-27, 76 y.o. female   MRN: [id number]  CC: Follow-up  HPI: Erin Esparza is a 76 y.o. female who presents to clinic today to follow-up from her ED visit on 10/11  Weakness Patient was seen on October 11 in ED for weakness thought to be secondary to dehydration.  She reports her symptoms have resolved.  She has been eating and drinking well and her appetite is back.  Additionally diarrhea has resolved.  She feels well today with no additional issues.  Arthritis Patient with arthritis in bilateral ankles.  Pain is worse at night.  She follows with [facility].  Currently taking only Tylenol for pain because she is on Xarelto.  ROS: Denies fever, chills, nausea, vomiting, diarrhea. [place]: Pertinent past medical, surgical, family, and social history were reviewed and updated as appropriate. Smoking status reviewed. Medications reviewed.  Objective:   BP (!) 142/62   Pulse 75   Temp 98.3 F (36.8 C) (Oral)   Ht 5\' 6"  (1.676 m)   Wt 286 lb (129.7 kg)   SpO2 99%   BMI 46.16 kg/m  Vitals and nursing note reviewed.  General: 76 year old African-American female, NAD HEENT: HEENT, EOMI, moist mucous membranes, oropharynx is clear Neck: supple, nontender CV: RRR no MRG Lungs: Clear to auscultation bilaterally with normal work of breathing Abdomen: soft, non-tender, no masses, positive for bowel sounds Skin: warm, dry, no rash Extremities: warm and well perfused, normal tone  Assessment & Plan:   Weakness Patient following up from ED visit on October 11 for weakness.  Thought to be secondary to dehydration.  Symptoms have resolved and she is eating and drinking well. -We will continue to monitor  Follow-up: PRN  Lovenia Kim, MD [place], PGY-2 04/13/2017 10:31 PM

## 2017-04-13 DIAGNOSIS — R531 Weakness: Secondary | ICD-10-CM | POA: Insufficient documentation

## 2017-04-13 NOTE — Assessment & Plan Note (Signed)
Patient following up from ED visit on October 11 for weakness.  Thought to be secondary to dehydration.  Symptoms have resolved and she is eating and drinking well. -We will continue to monitor

## 2017-04-16 ENCOUNTER — Ambulatory Visit (INDEPENDENT_AMBULATORY_CARE_PROVIDER_SITE_OTHER): Payer: Medicare Other

## 2017-04-16 ENCOUNTER — Encounter: Payer: Self-pay | Admitting: Podiatry

## 2017-04-16 ENCOUNTER — Ambulatory Visit (INDEPENDENT_AMBULATORY_CARE_PROVIDER_SITE_OTHER): Payer: Medicare Other | Admitting: Podiatry

## 2017-04-16 ENCOUNTER — Other Ambulatory Visit: Payer: Self-pay | Admitting: Podiatry

## 2017-04-16 DIAGNOSIS — M25572 Pain in left ankle and joints of left foot: Secondary | ICD-10-CM | POA: Diagnosis not present

## 2017-04-16 DIAGNOSIS — M779 Enthesopathy, unspecified: Secondary | ICD-10-CM | POA: Diagnosis not present

## 2017-04-16 DIAGNOSIS — G8929 Other chronic pain: Secondary | ICD-10-CM | POA: Diagnosis not present

## 2017-04-16 DIAGNOSIS — M25571 Pain in right ankle and joints of right foot: Secondary | ICD-10-CM | POA: Diagnosis not present

## 2017-04-16 MED ORDER — TRIAMCINOLONE ACETONIDE 10 MG/ML IJ SUSP
10.0000 mg | Freq: Once | INTRAMUSCULAR | Status: AC
  Administered 2017-04-16: 10 mg

## 2017-04-16 NOTE — Progress Notes (Signed)
Subjective:    Patient ID: Erin Esparza, female   DOB: 76 y.o.   MRN: [id number]   HPI patient presents with painful ankles bilateral that it just started up in the last few weeks    ROS      Objective:  Physical Exam neurovascular status intact with exquisite discomfort sinus tarsi bilateral   Assessment:    Acute sinus tarsitis bilateral     Plan:    Injection of the sinus tarsi bilateral 3 mg Kenalog 5 mg Xylocaine and advised on anti-inflammatories. Reappoint as symptoms indicate for repeat this procedure

## 2017-05-02 ENCOUNTER — Other Ambulatory Visit: Payer: Self-pay | Admitting: Family Medicine

## 2017-05-14 ENCOUNTER — Ambulatory Visit: Payer: Medicare Other | Admitting: Podiatry

## 2017-06-26 ENCOUNTER — Ambulatory Visit: Payer: Medicare Other

## 2017-06-26 ENCOUNTER — Ambulatory Visit (INDEPENDENT_AMBULATORY_CARE_PROVIDER_SITE_OTHER): Payer: Medicare Other | Admitting: Family Medicine

## 2017-06-26 ENCOUNTER — Other Ambulatory Visit: Payer: Self-pay

## 2017-06-26 ENCOUNTER — Encounter: Payer: Self-pay | Admitting: Family Medicine

## 2017-06-26 DIAGNOSIS — R05 Cough: Secondary | ICD-10-CM | POA: Diagnosis not present

## 2017-06-26 DIAGNOSIS — R059 Cough, unspecified: Secondary | ICD-10-CM

## 2017-06-26 MED ORDER — BENZONATATE 100 MG PO CAPS: 100.0000 mg | ORAL_CAPSULE | Freq: Two times a day (BID) | ORAL | 0 refills | Status: DC | PRN

## 2017-06-26 NOTE — Patient Instructions (Addendum)
You were seen in clinic for a cough, fever and some congestion which is most likely due to the flu or other viral upper respiratory tract infection.  As we discussed, antibiotics would not be beneficial in these cases.  I would recommend Tylenol for fever, plenty of warm fluids instead of cold drinks, and I have sent in a prescription for Tessalon perles for your cough.   I am including some information below for you to read if you would like.  It is important that you frequently wash your hands to prevent spread.  If you develop any new or worsening symptoms such as shortness of breath, recurrent fevers, or chest pain I would like for you to be seen by a provider again.  I hope you start to feel better soon.  Be well, Lovenia Kim, MD   Viral Respiratory Infection A viral respiratory infection is an illness that affects parts of the body used for breathing, like the lungs, nose, and throat. It is caused by a germ called a virus. Some examples of this kind of infection are:  A cold.  The flu (influenza).  A respiratory syncytial virus (RSV) infection.  How do I know if I have this infection? Most of the time this infection causes:  A stuffy or runny nose.  Yellow or green fluid in the nose.  A cough.  Sneezing.  Tiredness (fatigue).  Achy muscles.  A sore throat.  Sweating or chills.  A fever.  A headache.  How is this infection treated? If the flu is diagnosed early, it may be treated with an antiviral medicine. This medicine shortens the length of time a person has symptoms. Symptoms may be treated with over-the-counter and prescription medicines, such as:  Expectorants. These make it easier to cough up mucus.  Decongestant nasal sprays.  Doctors do not prescribe antibiotic medicines for viral infections. They do not work with this kind of infection. How do I know if I should stay home? To keep others from getting sick, stay home if you have:  A fever.  A lasting  cough.  A sore throat.  A runny nose.  Sneezing.  Muscles aches.  Headaches.  Tiredness.  Weakness.  Chills.  Sweating.  An upset stomach (nausea).  Follow these instructions at home:  Rest as much as possible.  Take over-the-counter and prescription medicines only as told by your doctor.  Drink enough fluid to keep your pee (urine) clear or pale yellow.  Gargle with salt water. Do this 3-4 times per day or as needed. To make a salt-water mixture, dissolve -1 tsp of salt in 1 cup of warm water. Make sure the salt dissolves all the way.  Use nose drops made from salt water. This helps with stuffiness (congestion). It also helps soften the skin around your nose.  Do not drink alcohol.  Do not use tobacco products, including cigarettes, chewing tobacco, and e-cigarettes. If you need help quitting, ask your doctor. Get help if:  Your symptoms last for 10 days or longer.  Your symptoms get worse over time.  You have a fever.  You have very bad pain in your face or forehead.  Parts of your jaw or neck become very swollen. Get help right away if:  You feel pain or pressure in your chest.  You have shortness of breath.  You faint or feel like you will faint.  You keep throwing up (vomiting).  You feel confused. This information is not intended to replace  advice given to you by your health care provider. Make sure you discuss any questions you have with your health care provider. Document Released: 05/01/2008 Document Revised: 10/25/2015 Document Reviewed: 10/25/2014 Elsevier Interactive Patient Education  2018 [employer].

## 2017-06-26 NOTE — Progress Notes (Signed)
   Subjective:   Patient ID: Erin Esparza    DOB: [birth year], 77 y.o. female   MRN: [id number]  CC: cough  HPI: Erin Esparza is a 77 y.o. female who presents to clinic today for the following issue.  Cough -Patient states her symptoms started about 3 days ago.  -Cough is dry and nonproductive with no blood noted. She states sometimes she has a headache, the aches and chills -Denies fevers but has not been checking her temperatures at home - She is febrile to 100.6 F today in office plan -She has been eating and drinking normally; drinking cold fluids such as fruit punch -She has taken coricidin for cold and flu and Tylenol x1 last night -Lives at home with her daughter, no sick contacts  ROS: Denies sore throat, shortness of breath, palpitations, chest pain, nausea, vomiting, diarrhea, abdominal pain.  Social: former smoker, quit ~30 years ago  Medications reviewed.  Objective:   BP 135/75 (BP Location: Right Arm, Patient Position: Sitting, Cuff Size: Large)   Pulse (!) 124   Temp (!) 100.6 F (38.1 C) (Oral)   Ht 5\' 6"  (1.676 m)   Wt 289 lb 6.4 oz (131.3 kg)   SpO2 97%   BMI 46.71 kg/m  Vitals and nursing note reviewed.  General: 77 year old female, NAD HEENT: NCAT, EOMI, PERRLA, MMM, oropharynx is clear without tonsillar erythema or exudate present, minimal rhinorrhea Neck: Supple, nontender, normal range of motion CV: RRR no MRG Lungs: CTA B, nonlabored breathing, no wheeze Abdomen: Soft, NT ND, positive bowel sounds Skin: warm, dry, no rash Extremities: warm and well perfused  Assessment & Plan:   Cough Signs and symptoms consistent with flu given presence of fever, body aches and chills.  Discussed that antibiotics would be of benefit as this is most likely due to a virus.  Tamiflu would also likely not benefit given she has had symptoms for greater than 3 days.  No red flags on exam and lung exam unremarkable.  Anticipate she will begin to turn the corner over  the next 3-4 days.  -Advised patient to drink plenty of warm fluids instead of cold drinks, and to stay well-hydrated -Tylenol as needed -Rx Tessalon Perles as needed for cough; may also try honey - Return precautions discussed  Meds ordered this encounter  Medications  . benzonatate (TESSALON) 100 MG capsule    Sig: Take 1 capsule (100 mg total) by mouth 2 (two) times daily as needed for cough.    Dispense:  20 capsule    Refill:  0   Follow-up: If symptoms worsen or do not improve  Lovenia Kim, MD [place], PGY-2 06/30/2017 5:48 PM

## 2017-06-30 DIAGNOSIS — R059 Cough, unspecified: Secondary | ICD-10-CM | POA: Insufficient documentation

## 2017-06-30 DIAGNOSIS — R05 Cough: Secondary | ICD-10-CM | POA: Insufficient documentation

## 2017-06-30 NOTE — Assessment & Plan Note (Addendum)
Signs and symptoms consistent with flu given presence of fever, body aches and chills.  Discussed that antibiotics would be of benefit as this is most likely due to a virus.  Tamiflu would also likely not benefit given she has had symptoms for greater than 3 days.  No red flags on exam and lung exam unremarkable.  Anticipate she will begin to turn the corner over the next 3-4 days.  -Advised patient to drink plenty of warm fluids instead of cold drinks, and to stay well-hydrated -Tylenol as needed -Rx Tessalon Perles as needed for cough; may also try honey - Return precautions discussed

## 2017-07-15 ENCOUNTER — Other Ambulatory Visit: Payer: Self-pay | Admitting: *Deleted

## 2017-07-15 MED ORDER — HYDROCHLOROTHIAZIDE 25 MG PO TABS: 25.0000 mg | ORAL_TABLET | Freq: Every day | ORAL | 0 refills | Status: DC

## 2017-07-24 ENCOUNTER — Ambulatory Visit: Payer: Medicare Other | Admitting: Family Medicine

## 2017-07-30 ENCOUNTER — Other Ambulatory Visit: Payer: Self-pay | Admitting: *Deleted

## 2017-07-31 ENCOUNTER — Ambulatory Visit (INDEPENDENT_AMBULATORY_CARE_PROVIDER_SITE_OTHER): Payer: Medicare Other | Admitting: Family Medicine

## 2017-07-31 DIAGNOSIS — M7989 Other specified soft tissue disorders: Secondary | ICD-10-CM

## 2017-07-31 MED ORDER — FUROSEMIDE 40 MG PO TABS: ORAL_TABLET | ORAL | 2 refills | Status: DC

## 2017-07-31 MED ORDER — RIVAROXABAN 20 MG PO TABS: 20.0000 mg | ORAL_TABLET | Freq: Every day | ORAL | 0 refills | Status: DC

## 2017-07-31 NOTE — Patient Instructions (Addendum)
You were seen in clinic today for leg swelling.  We discussed using compression stockings and elevating legs at home.  Additionally, I recommend increasing your Lasix to 1 tablet TWICE a day for the next 7 days.  You can then go back to 1 tablet daily.  That should help get some of the fluid off. If you have any chest pain, palpitations or shortness of breath I would like for you to return to be seen by a provider. Continue avoiding salt in your food as this will help your symptoms.  Please make an appointment next week prior to leaving town to make sure this is improving.  You may call clinic if you have any questions.  Be well, Lovenia Kim MD

## 2017-07-31 NOTE — Progress Notes (Signed)
   Subjective:   Patient ID: Erin Esparza    DOB: 1940-07-26, 77 y.o. female   MRN: [id number]  CC: Leg swelling   HPI: Erin Esparza is a 77 y.o. female who presents to clinic today for the following issue.   Leg swelling Symptoms have been present for 2-3 weeks.  Patient states she has been taking her Lasix 40 mg once in a while however this week has been taking it daily.  She has been avoiding salt.  Is elevating her legs at home.  She uses one pillow at nighttime to sleep.  She has not been wearing compression stockings.  Denies chest pain, palpitations, shortness of breath.  She has history of blood clots.  States she is out of town from 3/9 to 3/15 for vacation.   ROS: Denies fever, chills, nausea, vomiting.  Denies urinary symptoms.  Denies chest pain, shortness of breath, palpitations. +leg swelling.   Social: Patient is a former smoker Medications reviewed. Objective:   BP 130/70 (BP Location: Left Arm, Patient Position: Sitting, Cuff Size: Large)   Pulse 92   Temp 97.8 F (36.6 C) (Oral)   Wt 287 lb 3.2 oz (130.3 kg)   SpO2 98%   BMI 46.36 kg/m  Vitals and nursing note reviewed.  General: 77 year old female, NAD Neck: supple, no JVD CV: RRR no MRG Lungs: CTA B, normal effort, no wheeze or crackles present Abdomen: Soft, NT ND, positive bowel sounds Skin: warm, dry, no rash Extremities: warm and well perfused, pulses present, plus pitting edema present to mid shin bilaterally  Assessment & Plan:   Leg swelling Low suspicion for DVT given swelling is bilateral without warmth or erythema.  No calf tenderness on exam.  Suspect fluid overload given 1+ pitting edema bilaterally on lower extremities.  Lung exam unremarkable. -SCr 0.96 on lab work 03/2017 -Plan to increase Lasix to 40 mg twice daily for the next 7 days -Follow-up in 1 week prior to leaving town   Meds ordered this encounter  Medications  . furosemide (LASIX) 40 MG tablet    Sig: TAKE 1 TABLET BY MOUTH  DAILY AS NEEDED FOR LEG SWELLING    Dispense:  90 tablet    Refill:  2    **Patient requests 90 days supply**  . rivaroxaban (XARELTO) 20 MG TABS tablet    Sig: Take 1 tablet (20 mg total) by mouth daily.    Dispense:  90 tablet    Refill:  0   Follow-up: 1 week  Lovenia Kim, MD [place], PGY-2 08/07/2017 2:21 PM

## 2017-08-05 ENCOUNTER — Encounter: Payer: Self-pay | Admitting: Internal Medicine

## 2017-08-05 ENCOUNTER — Ambulatory Visit (INDEPENDENT_AMBULATORY_CARE_PROVIDER_SITE_OTHER): Payer: Medicare Other | Admitting: Internal Medicine

## 2017-08-05 DIAGNOSIS — M7989 Other specified soft tissue disorders: Secondary | ICD-10-CM | POA: Diagnosis not present

## 2017-08-05 NOTE — Progress Notes (Signed)
   Subjective:   Patient: Erin Esparza       Birthdate: 1940-09-08       MRN: [id number]      HPI  Erin Esparza is a 77 y.o. female presenting for f/u of LE edema.   LE edema Patient seen on 03/01 for this issue. LE edema primarily on L had increased, so Lasix was increased to 40mg  BID x7d and she was instructed to return at that time. Also encouraged to begin wearing compression stockings and elevate her leg.  Today, patient reports significant improvement in LE edema since increasing dose to 80mg  qd. Reports increased urinary frequency. Denies SOB, chest pains. Has been wearing compression stockings when she is running errands during the day. Has been keeping feet elevated at home. Is leaving for [facility] to attend her brother's 90th birthday party in two days and will be gone for two weeks.   Smoking status reviewed. Patient is former smoker.   Review of Systems See HPI.     Objective:  Physical Exam  Constitutional: She is oriented to person, place, and time and well-developed, well-nourished, and in no distress.  HENT:  Head: Normocephalic.  Cardiovascular: Normal rate, regular rhythm and normal heart sounds.  No murmur heard. Pulmonary/Chest: Effort normal and breath sounds normal. No respiratory distress. She has no wheezes. She has no rales.  Musculoskeletal:  1+ pitting edema to midshin on bilateral LE  Neurological: She is alert and oriented to person, place, and time.  Skin:  Skin changes consistent with chronic venous stasis on LE bilaterally  Psychiatric: Affect and judgment normal.      Assessment & Plan:  Leg swelling Improved after 1w increased dose of Lasix. Will decrease back to patient's previous dose of Lasix 40mg  qd, however instructed patient to increase dose to 80mg  qd again if she notices worsening of swelling while on vacation in [place]. If patient does require increased dose, she is to schedule appt as soon as she gets back from [place] in  two weeks to discuss this.    Adin Hector, MD, MPH PGY-3 [facility] Pager [phone]

## 2017-08-05 NOTE — Assessment & Plan Note (Signed)
Improved after 1w increased dose of Lasix. Will decrease back to patient's previous dose of Lasix 40mg  qd, however instructed patient to increase dose to 80mg  qd again if she notices worsening of swelling while on vacation in [place]. If patient does require increased dose, she is to schedule appt as soon as she gets back from [place] in two weeks to discuss this.

## 2017-08-05 NOTE — Patient Instructions (Signed)
It was nice meeting you today Erin Esparza!  Please go back to taking one 40mg  Lasix tablet a day. If you find that your leg swelling worsens again, you can increase to two tablets (80mg  total) a day as you have been taking. If you need to start taking 80 mg again, please be sure to schedule an appointment with [place] as soon as you get back from [place].   If you have any questions or concerns, please feel free to call the clinic.   Be well,  Dr. Avon Gully

## 2017-08-07 NOTE — Assessment & Plan Note (Signed)
Low suspicion for DVT given swelling is bilateral without warmth or erythema.  No calf tenderness on exam.  Suspect fluid overload given 1+ pitting edema bilaterally on lower extremities.  Lung exam unremarkable. -SCr 0.96 on lab work 03/2017 -Plan to increase Lasix to 40 mg twice daily for the next 7 days -Follow-up in 1 week prior to leaving town

## 2017-08-21 ENCOUNTER — Other Ambulatory Visit: Payer: Self-pay

## 2017-08-21 MED ORDER — BENZONATATE 100 MG PO CAPS: 100.0000 mg | ORAL_CAPSULE | Freq: Two times a day (BID) | ORAL | 0 refills | Status: DC | PRN

## 2017-08-21 NOTE — Telephone Encounter (Signed)
Patient left message on nurse line requesting refill on Benzonatate. Danley Danker, RN [facility] RN)

## 2017-09-04 ENCOUNTER — Ambulatory Visit (INDEPENDENT_AMBULATORY_CARE_PROVIDER_SITE_OTHER): Payer: Medicare Other

## 2017-09-04 ENCOUNTER — Other Ambulatory Visit: Payer: Self-pay | Admitting: Podiatry

## 2017-09-04 ENCOUNTER — Ambulatory Visit (INDEPENDENT_AMBULATORY_CARE_PROVIDER_SITE_OTHER): Payer: Medicare Other | Admitting: Podiatry

## 2017-09-04 ENCOUNTER — Encounter: Payer: Self-pay | Admitting: Podiatry

## 2017-09-04 DIAGNOSIS — R52 Pain, unspecified: Secondary | ICD-10-CM

## 2017-09-04 DIAGNOSIS — M109 Gout, unspecified: Secondary | ICD-10-CM | POA: Diagnosis not present

## 2017-09-04 NOTE — Progress Notes (Signed)
This patient presents the office with chief complaint of a painful left big toe joint, left foot.  She says this big toe joint started to become painful yesterday evening and got worse overnight.  She says she had difficulty sleeping due to the pain.  She says that the foot has become increasingly swollen and painful since last night.  She denies any history  Of trauma to the foot.  His patient has previously been treated with sinus tarsitis pain.  She states that she does have hypertension for which she takes a water pill.  She presents the office today for an evaluation and treatment of her painful left foot.  Patient is taking xarelto.  Neurovascular status is intact as to determine from previous visits.  She has redness swelling and pain in her left forefoot .she has significant pain upon palpation of thefirst MPJ left foot.  No evidence of any streaking up her lower leg is noted today.  The swelling extends over the metatarsals just behind the [facility] joint left foot.  Gout left foot  ROV.  X-rays taken do reveal cystic changes at the head of the first metatarsal at the level of the first MPJ left foot. Treated this patient with injection therapy. Injection therapy using 1.0 cc. Of 2% xylocaine( 20 mg.) plus 1 cc. of kenalog-la ( 10 mg) plus 1/2 cc. of dexamethazone phosphate ( 2 mg).  Patient was not prescribed an anti-inflammatory medicine since she is taking xarelto.  Patient was told to ambulate at home with a surgical shoe.  She was told to rest, ice and elevate her left foot.  If pain persists, she should call the ourther evaluation and treatment.  She should return to the office in one week for further evaluation and treatment.  She should consider evaluation by her primary care physician in future.   Gardiner Barefoot DPM

## 2017-09-11 ENCOUNTER — Encounter: Payer: Self-pay | Admitting: Podiatry

## 2017-09-11 ENCOUNTER — Ambulatory Visit (INDEPENDENT_AMBULATORY_CARE_PROVIDER_SITE_OTHER): Payer: Medicare Other | Admitting: Podiatry

## 2017-09-11 DIAGNOSIS — M109 Gout, unspecified: Secondary | ICD-10-CM | POA: Diagnosis not present

## 2017-09-11 NOTE — Progress Notes (Signed)
This patient presents to the office for continued evaluation and treatment of a painful left foot.  She was diagnosed with due to the swelling and the pain on the top of her big toe joint, left foot.  Marland Kitchen She was treated with injection therapy in her right foot.  She presents the office stating that she is 100% improved, but does have a little swelling.  She says she is able to walk better and presents the office today for continued evaluation of her left foot.  . Neurovascular status intact as determined from previous visits.  There is no evidence of any redness, swelling or pain in her left forefoot.  No pain is noted upon range of motion the first MPJ left foot.  Swelling has resolved in her left foot.  There is still an increased temperature noted indication inflammation persists.   Resolved gout attack.   . Patient was told to return to the office if this problem persists.  She should also mention to her primary care physician that she had this episode.    RTC prn   Gardiner Barefoot DPM

## 2017-10-15 ENCOUNTER — Other Ambulatory Visit: Payer: Self-pay | Admitting: Family Medicine

## 2017-10-16 ENCOUNTER — Other Ambulatory Visit: Payer: Self-pay | Admitting: Hematology and Oncology

## 2017-10-16 DIAGNOSIS — Z1231 Encounter for screening mammogram for malignant neoplasm of breast: Secondary | ICD-10-CM

## 2017-10-27 ENCOUNTER — Other Ambulatory Visit: Payer: Self-pay

## 2017-10-28 MED ORDER — RIVAROXABAN 20 MG PO TABS: 20.0000 mg | ORAL_TABLET | Freq: Every day | ORAL | 0 refills | Status: DC

## 2017-10-29 ENCOUNTER — Other Ambulatory Visit: Payer: Self-pay

## 2017-10-30 MED ORDER — HYDROCHLOROTHIAZIDE 25 MG PO TABS: 25.0000 mg | ORAL_TABLET | Freq: Every day | ORAL | 0 refills | Status: DC

## 2017-11-03 DIAGNOSIS — H25813 Combined forms of age-related cataract, bilateral: Secondary | ICD-10-CM | POA: Diagnosis not present

## 2017-11-03 DIAGNOSIS — H40013 Open angle with borderline findings, low risk, bilateral: Secondary | ICD-10-CM | POA: Diagnosis not present

## 2017-11-03 DIAGNOSIS — H40033 Anatomical narrow angle, bilateral: Secondary | ICD-10-CM | POA: Diagnosis not present

## 2017-11-03 DIAGNOSIS — H353131 Nonexudative age-related macular degeneration, bilateral, early dry stage: Secondary | ICD-10-CM | POA: Diagnosis not present

## 2017-11-10 ENCOUNTER — Encounter: Payer: Self-pay | Admitting: Family Medicine

## 2017-11-10 ENCOUNTER — Other Ambulatory Visit: Payer: Self-pay

## 2017-11-10 ENCOUNTER — Other Ambulatory Visit: Payer: Self-pay | Admitting: Family Medicine

## 2017-11-10 ENCOUNTER — Ambulatory Visit
Admission: RE | Admit: 2017-11-10 | Discharge: 2017-11-10 | Disposition: A | Discharge status: Home / Self Care | Payer: Medicare Other | Source: Ambulatory Visit | Attending: Family Medicine | Admitting: Family Medicine

## 2017-11-10 ENCOUNTER — Ambulatory Visit (INDEPENDENT_AMBULATORY_CARE_PROVIDER_SITE_OTHER): Payer: Medicare Other | Admitting: Family Medicine

## 2017-11-10 VITALS — BP 144/72 | HR 71 | Wt 286.0 lb

## 2017-11-10 DIAGNOSIS — M25562 Pain in left knee: Secondary | ICD-10-CM

## 2017-11-10 DIAGNOSIS — M1712 Unilateral primary osteoarthritis, left knee: Secondary | ICD-10-CM | POA: Diagnosis not present

## 2017-11-10 MED ORDER — DICLOFENAC SODIUM 3 % TD GEL: TRANSDERMAL | 0 refills | Status: DC

## 2017-11-10 NOTE — Progress Notes (Signed)
Erin Esparza is a 77 y.o. female   HPI:  Knee Pain: Patient presents with knee pain involving the  left knee. Onset of the symptoms was yesterday. Inciting event: none known. Current symptoms include pain located around joint line and patella, stiffness and swelling. Pain is aggravated by any weight bearing.  Patient has had prior knee problems. Evaluation to date: none. Treatment to date: none.  Past Medical History:  Diagnosis Date  . Arthritis    "both feet" (10/17/2016)  . Asthma   . Breast cancer ([place]) 01/06/12   left breast invasive ductal ca,dcis,ER/PR=+,  . Breast mass in female   . Clotting disorder ([place])   . Diastolic congestive heart failure ([place])   . DVT (deep venous thrombosis) ([place]) 1990's?   LLE  . Hypertension   . Insulin resistance   . Pulmonary embolism ([place]) ~ 2012  . Radiation 02/12/12 -03/12/12   left breast, total 50gy  . Right ankle pain   . Shortness of breath   . Venous stasis of lower extremity   . Wears dentures    upper  . Wears glasses   . Wears partial dentures    lower   Past Surgical History:  Procedure Laterality Date  . BREAST BIOPSY Left 11/03/11   10 o'clock=benign breast parenchyma  . BREAST LUMPECTOMY Left 01/06/12    Dr. Erroll Luna    Current Outpatient Medications:  .  acetaminophen (TYLENOL) 500 MG tablet, Take 1 tablet (500 mg total) by mouth every 6 (six) hours as needed. (Patient taking differently: Take 500 mg by mouth every 6 (six) hours as needed for moderate pain. ), Disp: 30 tablet, Rfl: 3 .  ADVAIR DISKUS 100-50 MCG/DOSE AEPB, INHALE 1 PUFF BY MOUTH TWICE DAILY AS DIRECTED, Disp: 60 each, Rfl: 0 .  ADVAIR DISKUS 100-50 MCG/DOSE AEPB, INHALE 1 PUFF BY MOUTH TWICE DAILY AS DIRECTED, Disp: 60 each, Rfl: 0 .  anastrozole (ARIMIDEX) 1 MG tablet, TAKE 1 TABLET(1 MG) BY MOUTH DAILY, Disp: 90 tablet, Rfl: 0 .  benzonatate (TESSALON) 100 MG capsule, Take 1 capsule (100 mg total) by mouth 2 (two) times daily as needed for cough., Disp:  20 capsule, Rfl: 0 .  Calcium Carbonate (CALTRATE 600 PO), Take 1 tablet by mouth 2 (two) times daily. , Disp: , Rfl:  .  enalapril (VASOTEC) 20 MG tablet, TAKE 1 TABLET BY MOUTH TWICE DAILY, Disp: 180 tablet, Rfl: 0 .  furosemide (LASIX) 40 MG tablet, TAKE 1 TABLET BY MOUTH DAILY AS NEEDED FOR LEG SWELLING, Disp: 90 tablet, Rfl: 2 .  hydrochlorothiazide (HYDRODIURIL) 25 MG tablet, Take 1 tablet (25 mg total) by mouth daily., Disp: 90 tablet, Rfl: 0 .  Multiple Vitamin (MULTIVITAMIN) tablet, Take 1 tablet by mouth daily., Disp: , Rfl:  .  Multiple Vitamins-Minerals (ICAPS AREDS 2 PO), Take 1 capsule by mouth 2 (two) times daily., Disp: , Rfl:  .  Multiple Vitamins-Minerals (WOMENS BONE HEALTH PO), Take 1 tablet by mouth daily., Disp: , Rfl:  .  rivaroxaban (XARELTO) 20 MG TABS tablet, Take 1 tablet (20 mg total) by mouth daily., Disp: 90 tablet, Rfl: 0 No Known Allergies  reports that she has quit smoking. Her smoking use included cigarettes. She has a 4.50 pack-year smoking history. She has never used smokeless tobacco. She reports that she does not drink alcohol or use drugs. Family History  Problem Relation Age of Onset  . Heart disease Mother   . Heart attack Mother   . Cancer  Sister        Breast  . Parkinson's disease Brother   . Pulmonary embolism Daughter   . Diabetes Daughter   . Pulmonary embolism Daughter     Knee: Normal to inspection with no erythema or effusion or obvious bony abnormalities. Palpation with no warmth but joint line tenderness noted  ROM limited in flexion and extension and lower leg rotation. Ligaments with solid consistent endpoints including ACL, PCL, LCL, MCL. painful patellar compression. Patellar and quadriceps tendons unremarkable.  Assessment and plan: Patient is a 77 year old female who presents today with left knee pain for the past 24 hours.  Patient has history of bilateral knee arthritis.  She reports swelling that has improved with icing  however pain is still present despite Tylenol.  On exam, patient is tender around patella and joint line.  No crepitus noted.  Knee exam limited given pain and body habitus.  No recent imaging seen in patient's record.  Plan is to do standing x-ray of her left knee.  We will also prescribe Voltaren gel since patient is unable to take any NSAIDs while on Xarelto.  Also recommended compression sleeve and continue icing as needed for swelling.  If pain does not improve in the next few days patient will return to clinic for possible injection.  Patient and daughter in agreement with plan.

## 2017-11-10 NOTE — Telephone Encounter (Signed)
error 

## 2017-11-10 NOTE — Patient Instructions (Signed)
It was great seeing you today! We have addressed the following issues today  1. We will do an xray of your left knee. 2. I also prescribe a gel for you knee  3. If no improvement, come back to clinic for possible knee injection.  If we did any lab work today, and the results require attention, either me or my nurse will get in touch with you. If everything is normal, you will get a letter in mail and a message via . If you don't hear from [place] in two weeks, please give [place] a call. Otherwise, we look forward to seeing you again at your next visit. If you have any questions or concerns before then, please call the clinic at [phone].  Please bring all your medications to every doctors visit  Sign up for My Chart to have easy access to your labs results, and communication with your Primary care physician. Please ask Front Desk for some assistance.   Please check-out at the front desk before leaving the clinic.    Take Care,   Dr. Andy Gauss

## 2017-11-11 ENCOUNTER — Telehealth: Payer: Self-pay

## 2017-11-11 NOTE — Telephone Encounter (Signed)
  It was not me, she needed prior auth for voltaren gel, may be nurse clinic.   Marjie Skiff, MD [place], PGY-2

## 2017-11-11 NOTE — Telephone Encounter (Signed)
Pt called nurse line saying, "someone called me from this number." No notes in chart, pt was seen yesterday by [place]. Will forward to him and to PCP.

## 2017-11-26 ENCOUNTER — Ambulatory Visit
Admission: RE | Admit: 2017-11-26 | Discharge: 2017-11-26 | Disposition: A | Discharge status: Home / Self Care | Payer: Medicare Other | Source: Ambulatory Visit | Attending: Hematology and Oncology | Admitting: Hematology and Oncology

## 2017-11-26 DIAGNOSIS — Z1231 Encounter for screening mammogram for malignant neoplasm of breast: Secondary | ICD-10-CM

## 2017-12-08 ENCOUNTER — Encounter: Payer: Self-pay | Admitting: Podiatry

## 2017-12-08 ENCOUNTER — Ambulatory Visit (INDEPENDENT_AMBULATORY_CARE_PROVIDER_SITE_OTHER): Payer: Medicare Other | Admitting: Podiatry

## 2017-12-08 DIAGNOSIS — M779 Enthesopathy, unspecified: Secondary | ICD-10-CM

## 2017-12-08 MED ORDER — TRIAMCINOLONE ACETONIDE 10 MG/ML IJ SUSP
10.0000 mg | Freq: Once | INTRAMUSCULAR | Status: AC
  Administered 2017-12-08: 10 mg

## 2017-12-09 NOTE — Progress Notes (Signed)
Subjective:   Patient ID: Erin Esparza, female   DOB: 77 y.o.   MRN: [id number]   HPI Patient presents with acute ankle pain bilateral and states the medication helps her for a good period of time   ROS      Objective:  Physical Exam  Neurovascular status unchanged with patient having moderate edema in the ankles of both feet with negative Homans sign and is quite a bit of discomfort in the sinus tarsi bilateral     Assessment:  Poor health individual with probable venous insufficiency with exquisite discomfort in the sinus tarsi bilateral     Plan:  Injected the sinus tarsi bilateral 3 mg Kenalog 5 mg Xylocaine advised on elevation as needed and reappoint to recheck as needed signed visit

## 2017-12-25 ENCOUNTER — Other Ambulatory Visit: Payer: Self-pay | Admitting: *Deleted

## 2017-12-25 MED ORDER — FLUTICASONE-SALMETEROL 100-50 MCG/DOSE IN AEPB: INHALATION_SPRAY | RESPIRATORY_TRACT | 0 refills | Status: DC

## 2018-01-01 ENCOUNTER — Inpatient Hospital Stay: Payer: Medicare Other | Attending: Adult Health | Admitting: Adult Health

## 2018-01-01 ENCOUNTER — Telehealth: Payer: Self-pay

## 2018-01-01 ENCOUNTER — Telehealth: Payer: Self-pay | Admitting: Adult Health

## 2018-01-01 ENCOUNTER — Encounter: Payer: Self-pay | Admitting: Adult Health

## 2018-01-01 VITALS — BP 121/58 | HR 77 | Temp 98.4°F | Resp 17 | Ht 66.0 in | Wt 281.8 lb

## 2018-01-01 DIAGNOSIS — Z803 Family history of malignant neoplasm of breast: Secondary | ICD-10-CM | POA: Diagnosis not present

## 2018-01-01 DIAGNOSIS — Z853 Personal history of malignant neoplasm of breast: Secondary | ICD-10-CM

## 2018-01-01 DIAGNOSIS — Z923 Personal history of irradiation: Secondary | ICD-10-CM | POA: Diagnosis not present

## 2018-01-01 DIAGNOSIS — C50212 Malignant neoplasm of upper-inner quadrant of left female breast: Secondary | ICD-10-CM

## 2018-01-01 DIAGNOSIS — E2839 Other primary ovarian failure: Secondary | ICD-10-CM

## 2018-01-01 DIAGNOSIS — Z1239 Encounter for other screening for malignant neoplasm of breast: Secondary | ICD-10-CM

## 2018-01-01 NOTE — Telephone Encounter (Signed)
-----   Message from Gardenia Phlegm, NP sent at 01/01/2018  2:59 PM EDT ----- Please let patient know that she qualifies for genetic testing.  Please document her decision in phone note on whether or not she accepts recommendation.  Thanks so much,  Mendel Ryder ----- Message ----- From: Clarene Essex, Counselor Sent: 01/01/2018   2:35 PM To: Gardenia Phlegm, NP  Yes, she qualifies! ----- Message ----- From: Gardenia Phlegm, NP Sent: 01/01/2018  11:29 AM To: Clarene Essex, Counselor  Will you look at patient family history and let me know if she qualifies for testing?   Thanks,  Mendel Ryder

## 2018-01-01 NOTE — Telephone Encounter (Signed)
Spoke with pt to inform her that she qualifies for genetic testing per NP. Nurse asked pt if this is something she would be interested in doing.  Pt states "no I don't want to do that at this time".  Pt voiced understanding of phone call.  No other needs at this time.

## 2018-01-01 NOTE — Telephone Encounter (Signed)
Gave patient avs and calendar of upcoming appts.  °

## 2018-01-01 NOTE — Progress Notes (Signed)
CLINIC:  Survivorship   REASON FOR VISIT:  Routine follow-up for history of breast cancer.   BRIEF ONCOLOGIC HISTORY:    Primary cancer of upper inner quadrant of left female breast ([place])   10/23/2011 Mammogram    Suspicious mass at 10:00 position 7 cm from the left nipple. Ultrasound irregular hypoechoic mass 7 x 6 x 5 mm      01/06/2012 Initial Biopsy    Initial biopsy was benign . 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 Mccreadie presents to the [facility] today for routine follow-up for her history of breast cancer.  Overall, she reports feeling quite well. She stopped taking Anastrozole last year.    Graciemae has been seeing her PCP regularly.  She is doing well.  She underwent a mammogram in 10/2017 that was normal.  She has finished colon cancer screening.  She no longer requires pap smears.   She has not undergone skin cancer screening.    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     REVIEW OF SYSTEMS:  Review of Systems  Constitutional: Negative for appetite change, chills, fatigue, fever and unexpected weight change.  HENT:   Negative for hearing loss and lump/mass.   Eyes: Negative for eye problems and icterus.  Respiratory: Negative for chest tightness, cough and shortness of breath.   Cardiovascular: Negative for chest pain, leg swelling and palpitations.  Gastrointestinal: Negative for abdominal distention, abdominal pain,  constipation, diarrhea, nausea and vomiting.  Endocrine: Negative for hot flashes.  Skin: Negative for itching and rash.  Neurological: Negative for dizziness, extremity weakness, headaches and numbness.  Hematological: Negative for adenopathy. Does not bruise/bleed easily.  Psychiatric/Behavioral: Negative for depression.  Breast: Denies any new nodularity, masses, tenderness, nipple changes, or nipple discharge.       PAST MEDICAL/SURGICAL HISTORY:  Past Medical History:  Diagnosis Date  . Arthritis    "both feet" (10/17/2016)  . Asthma   . Breast cancer ([place]) 01/06/12   left breast invasive ductal ca,dcis,ER/PR=+,  . Breast mass in female   . Clotting disorder ([place])   . Diastolic congestive heart failure ([place])   . DVT (deep venous thrombosis) ([place]) 1990's?   LLE  . Hypertension   . Insulin resistance   . Pulmonary embolism ([place]) ~ 2012  . Radiation 02/12/12 -03/12/12   left breast, total 50gy  . Right ankle pain   . Shortness of breath   . Venous stasis of lower extremity   . Wears dentures    upper  . Wears glasses   . Wears partial dentures    lower   Past Surgical History:  Procedure Laterality Date  . BREAST BIOPSY Left 11/03/11   10 o'clock=benign breast parenchyma  . BREAST LUMPECTOMY Left 01/06/12    Dr. Erroll Luna     ALLERGIES:  No Known Allergies   CURRENT MEDICATIONS:  Outpatient Encounter Medications as of 01/01/2018  Medication Sig  . acetaminophen (TYLENOL) 500 MG tablet Take 1 tablet (500 mg total) by mouth every 6 (six) hours as needed. (Patient taking differently: Take 500 mg by mouth every 6 (six) hours as needed for moderate pain. )  . benzonatate (TESSALON) 100 MG capsule Take 1 capsule (100 mg total) by mouth 2 (two) times daily as needed for cough.  . Calcium Carbonate (CALTRATE 600 PO) Take 1 tablet by mouth 2 (two) times daily.   . enalapril (VASOTEC) 20 MG tablet TAKE 1 TABLET BY MOUTH TWICE DAILY  . Fluticasone-Salmeterol (ADVAIR  DISKUS) 100-50 MCG/DOSE AEPB INHALE 1 PUFF BY MOUTH TWICE DAILY AS DIRECTED  . furosemide (LASIX) 40 MG tablet TAKE 1 TABLET BY MOUTH DAILY AS NEEDED FOR LEG SWELLING  . hydrochlorothiazide (HYDRODIURIL) 25 MG tablet Take 1 tablet (25 mg total) by mouth daily.  . Multiple Vitamin (MULTIVITAMIN) tablet Take 1 tablet by mouth daily.  . Multiple Vitamins-Minerals (ICAPS AREDS 2 PO) Take 1 capsule by mouth 2 (two) times daily.  . Multiple Vitamins-Minerals (WOMENS BONE HEALTH PO) Take 1 tablet by mouth daily.  . rivaroxaban (XARELTO) 20 MG TABS tablet Take 1 tablet (20 mg total) by mouth daily.  . Vitamin D, Cholecalciferol, 1000 units CAPS Take by mouth daily.  . [DISCONTINUED] ADVAIR DISKUS 100-50 MCG/DOSE AEPB INHALE 1 PUFF BY MOUTH TWICE DAILY AS DIRECTED (Patient not taking: Reported on 01/01/2018)  . [DISCONTINUED] anastrozole (ARIMIDEX) 1 MG tablet TAKE 1 TABLET(1 MG) BY MOUTH DAILY (Patient not taking: Reported on 01/01/2018)  . [DISCONTINUED] Diclofenac Sodium 3 % GEL Apply to left knee (Patient not taking: Reported on 01/01/2018)   No facility-administered encounter medications on file as of 01/01/2018.      ONCOLOGIC FAMILY HISTORY:  Family History  Problem Relation Age of Onset  . Heart disease Mother   . Heart attack Mother   . Breast cancer Sister   . Parkinson's disease Brother   . Pulmonary embolism Daughter   . Diabetes Daughter   . Pulmonary embolism Daughter   . Breast cancer Sister   . Cancer Brother        unsure  . Other Brother        MVA  . Other Brother        MVA    GENETIC COUNSELING/TESTING: Updated family history and sent to Roma Kayser in genetics to review  SOCIAL HISTORY:  Social History   Socioeconomic History  . Marital status: Widowed    Spouse name: Not on file  . Number of children: 5  . Years of education: 33  . Highest education level: Not on file  Occupational History  . Occupation: Retired    Fish farm manager: RETIRED  Social Needs  . Financial  resource strain: Not on file  . Food insecurity:    Worry: Not on file    Inability: Not on file  . Transportation needs:    Medical: Not on file    Non-medical: Not on file  Tobacco Use  . Smoking status: Former Smoker    Packs/day: 0.30    Years: 15.00    Pack years: 4.50    Types: Cigarettes  . Smokeless tobacco: Never Used  . Tobacco comment: "stopped in the 1990s"  Substance and Sexual Activity  . Alcohol use: No  . Drug use: No  . Sexual activity: Never  Lifestyle  . Physical activity:  Days per week: Not on file    Minutes per session: Not on file  . Stress: Not on file  Relationships  . Social connections:    Talks on phone: Not on file    Gets together: Not on file    Attends religious service: Not on file    Active member of club or organization: Not on file    Attends meetings of clubs or organizations: Not on file    Relationship status: Not on file  . Intimate partner violence:    Fear of current or ex partner: Not on file    Emotionally abused: Not on file    Physically abused: Not on file    Forced sexual activity: Not on file  Other Topics Concern  . Not on file  Social History Narrative   Health Care POA:    Emergency Contact: daughter, Freddie Breech (c) [phone] or Gross (C) [phone]   End of Life Plan:    Who lives with you: daughter - Mcclarty   Any pets: dog   Diet: Pt has a varied diet.   Exercise: Pt regularly does low impact aerobic exercises with TV program.    Seatbelts: Pt reports wearing seatbelt when in vehicles.    Nancy Fetter Exposure/Protection:    Hobbies: Bingo         PHYSICAL EXAMINATION:  Vital Signs: Vitals:   01/01/18 1049  BP: (!) 121/58  Pulse: 77  Resp: 17  Temp: 98.4 F (36.9 C)  SpO2: 98%   Filed Weights   01/01/18 1049  Weight: 281 lb 12.8 oz (127.8 kg)   General: Well-nourished, well-appearing female in no acute distress.  Unaccompanied today.   HEENT: Head is normocephalic.  Pupils equal and reactive to  light. Conjunctivae clear without exudate.  Sclerae anicteric. Oral mucosa is pink, moist.  Oropharynx is pink without lesions or erythema.  Lymph: No cervical, supraclavicular, or infraclavicular lymphadenopathy noted on palpation.  Cardiovascular: Regular rate and rhythm.Marland Kitchen Respiratory: Clear to auscultation bilaterally. Chest expansion symmetric; breathing non-labored.  Breast Exam:  -Left breast: No appreciable masses on palpation. No skin redness, thickening, or peau d'orange appearance; no nipple retraction or nipple discharge; mild distortion in symmetry at previous lumpectomy site well healed scar without erythema or nodularity.  -Right breast: No appreciable masses on palpation. No skin redness, thickening, or peau d'orange appearance; no nipple retraction or nipple discharge;  -Axilla: No axillary adenopathy bilaterally.  GI: Abdomen soft and round; non-tender, non-distended. Bowel sounds normoactive. No hepatosplenomegaly.   GU: Deferred.  Neuro: No focal deficits. Steady gait.  Psych: Mood and affect normal and appropriate for situation.  MSK: No focal spinal tenderness to palpation, full range of motion in bilateral upper extremities Extremities: No edema. Skin: Warm and dry.  LABORATORY DATA:  None for this visit   DIAGNOSTIC IMAGING:  Most recent mammogram:      ASSESSMENT AND PLAN:  Ms.. Bellissimo is a pleasant 77 y.o. female with history of Stage IA left breast invasive ductal carcinoma, ER+/PR+/HER2-, diagnosed in 01/2012, treated with lumpectomy, adjuvant radiation therapy, and anti-estrogen therapy with Anastrozole x 5 years.  [facility] for [facility] and routine follow-up.   1. History of breast cancer:  Ms. Dick is currently clinically and radiographically without evidence of disease or recurrence of breast cancer. She will be due for mammogram in 11/2018; orders placed today.  I encouraged her to call me with any questions or concerns  before her next visit at the  cancer center, and I would be happy to see her sooner, if needed.    2. FH of breast cancer: I updated her family history and sent message to Roma Kayser to see if she is eligible for genetic testing.  She has two siblings with breast cancer, however she does have about 13 siblings.    3. Bone health:  Given Ms. Holzheimer's age, history of breast cancer, and her previous anti-estrogen therapy with Anastrozole, she is at risk for bone demineralization. Her last DEXA scan was in 2016 and it was normal.  I have ordered a repeat bone density in 11/2018 with her mammogram.  In the meantime, she was encouraged to increase her consumption of foods rich in calcium, as well as increase her weight-bearing activities.  She was given education on specific food and activities to promote bone health.  4. Cancer screening:  Due to Ms. Mervine's history and her age, she should receive screening for skin cancer. She was encouraged to follow-up with her PCP for appropriate cancer screenings.   5. Health maintenance and wellness promotion: Ms. Graefe was encouraged to consume 5-7 servings of fruits and vegetables per day. She was also encouraged to engage in moderate to vigorous exercise for 30 minutes per day most days of the week. She was instructed to limit her alcohol consumption and continue to abstain from tobacco use.    Dispo:  -Return to cancer center in one year for LTS f/u -Mammogram and bone density in 11/2018   A total of (30) minutes of face-to-face time was spent with this patient with greater than 50% of that time in counseling and care-coordination.   Gardenia Phlegm, [place] [phone]   Note: PRIMARY CARE PROVIDER Lovenia Kim, [place] [phone]

## 2018-01-01 NOTE — Patient Instructions (Signed)
Bone Health Bones protect organs, store calcium, and anchor muscles. Good health habits, such as eating nutritious foods and exercising regularly, are important for maintaining healthy bones. They can also help to prevent a condition that causes bones to lose density and become weak and brittle (osteoporosis). Why is bone mass important? Bone mass refers to the amount of bone tissue that you have. The higher your bone mass, the stronger your bones. An important step toward having healthy bones throughout life is to have strong and dense bones during childhood. A young adult who has a high bone mass is more likely to have a high bone mass later in life. Bone mass at its greatest it is called peak bone mass. A large decline in bone mass occurs in older adults. In women, it occurs about the time of menopause. During this time, it is important to practice good health habits, because if more bone is lost than what is replaced, the bones will become less healthy and more likely to break (fracture). If you find that you have a low bone mass, you may be able to prevent osteoporosis or further bone loss by changing your diet and lifestyle. How can I find out if my bone mass is low? Bone mass can be measured with an X-ray test that is called a bone mineral density (BMD) test. This test is recommended for all women who are age 65 or older. It may also be recommended for men who are age 70 or older, or for people who are more likely to develop osteoporosis due to:  Having bones that break easily.  Having a long-term disease that weakens bones, such as kidney disease or rheumatoid arthritis.  Having menopause earlier than normal.  Taking medicine that weakens bones, such as steroids, thyroid hormones, or hormone treatment for breast cancer or prostate cancer.  Smoking.  Drinking three or more alcoholic drinks each day.  What are the nutritional recommendations for healthy bones? To have healthy bones, you  need to get enough of the right minerals and vitamins. Most nutrition experts recommend getting these nutrients from the foods that you eat. Nutritional recommendations vary from person to person. Ask your health care provider what is healthy for you. Here are some general guidelines. Calcium Recommendations Calcium is the most important (essential) mineral for bone health. Most people can get enough calcium from their diet, but supplements may be recommended for people who are at risk for osteoporosis. Good sources of calcium include:  Dairy products, such as low-fat or nonfat milk, cheese, and yogurt.  Dark green leafy vegetables, such as bok choy and broccoli.  Calcium-fortified foods, such as orange juice, cereal, bread, soy beverages, and tofu products.  Nuts, such as almonds.  Follow these recommended amounts for daily calcium intake:  Children, age 1?3: 700 mg.  Children, age 4?8: 1,000 mg.  Children, age 9?13: 1,300 mg.  Teens, age 14?18: 1,300 mg.  Adults, age 19?50: 1,000 mg.  Adults, age 51?70: ? Men: 1,000 mg. ? Women: 1,200 mg.  Adults, age 71 or older: 1,200 mg.  Pregnant and breastfeeding females: ? Teens: 1,300 mg. ? Adults: 1,000 mg.  Vitamin D Recommendations Vitamin D is the most essential vitamin for bone health. It helps the body to absorb calcium. Sunlight stimulates the skin to make vitamin D, so be sure to get enough sunlight. If you live in a cold climate or you do not get outside often, your health care provider may recommend that you take vitamin   D supplements. Good sources of vitamin D in your diet include:  Egg yolks.  Saltwater fish.  Milk and cereal fortified with vitamin D.  Follow these recommended amounts for daily vitamin D intake:  Children and teens, age 1?18: 600 international units.  Adults, age 50 or younger: 400-800 international units.  Adults, age 51 or older: 800-1,000 international units.  Other Nutrients Other nutrients  for bone health include:  Phosphorus. This mineral is found in meat, poultry, dairy foods, nuts, and legumes. The recommended daily intake for adult men and adult women is 700 mg.  Magnesium. This mineral is found in seeds, nuts, dark green vegetables, and legumes. The recommended daily intake for adult men is 400?420 mg. For adult women, it is 310?320 mg.  Vitamin K. This vitamin is found in green leafy vegetables. The recommended daily intake is 120 mg for adult men and 90 mg for adult women.  What type of physical activity is best for building and maintaining healthy bones? Weight-bearing and strength-building activities are important for building and maintaining peak bone mass. Weight-bearing activities cause muscles and bones to work against gravity. Strength-building activities increases muscle strength that supports bones. Weight-bearing and muscle-building activities include:  Walking and hiking.  Jogging and running.  Dancing.  Gym exercises.  Lifting weights.  Tennis and racquetball.  Climbing stairs.  Aerobics.  Adults should get at least 30 minutes of moderate physical activity on most days. Children should get at least 60 minutes of moderate physical activity on most days. Ask your health care provide what type of exercise is best for you. Where can I find more information? For more information, check out the following websites:  National Osteoporosis Foundation: http://nof.org/learn/basics  National Institutes of Health: http://www.niams.nih.gov/Health_Info/Bone/Bone_Health/bone_health_for_life.asp  This information is not intended to replace advice given to you by your health care provider. Make sure you discuss any questions you have with your health care provider. Document Released: 08/09/2003 Document Revised: 12/07/2015 Document Reviewed: 05/24/2014 Elsevier Interactive Patient Education  2018 Elsevier Inc.  

## 2018-01-14 ENCOUNTER — Ambulatory Visit (INDEPENDENT_AMBULATORY_CARE_PROVIDER_SITE_OTHER): Payer: Medicare Other | Admitting: Family Medicine

## 2018-01-14 ENCOUNTER — Other Ambulatory Visit: Payer: Self-pay

## 2018-01-14 ENCOUNTER — Encounter: Payer: Self-pay | Admitting: Family Medicine

## 2018-01-14 VITALS — BP 136/60 | HR 75 | Temp 98.3°F | Wt 283.0 lb

## 2018-01-14 DIAGNOSIS — M5442 Lumbago with sciatica, left side: Secondary | ICD-10-CM | POA: Diagnosis not present

## 2018-01-14 DIAGNOSIS — M545 Low back pain, unspecified: Secondary | ICD-10-CM

## 2018-01-14 MED ORDER — TRAMADOL HCL 50 MG PO TABS: 50.0000 mg | ORAL_TABLET | Freq: Three times a day (TID) | ORAL | 0 refills | Status: DC | PRN

## 2018-01-14 MED ORDER — PREDNISONE 10 MG PO TABS: 30.0000 mg | ORAL_TABLET | Freq: Every day | ORAL | 0 refills | Status: DC

## 2018-01-14 NOTE — Patient Instructions (Signed)
Start taking the prednisone tablets, Three tablets once a day for three days.  This is like the medicine the foot doctors inject into your feet.   Try and take your first dose today.  It takes about 6 hours for it to start working.    Start taking the pain medicine called tramadol when you need quicker relief from your pain.  If a whole tablet makes you nauseated or interferes with your thinking, then try taking just a half tablet at a time.

## 2018-01-15 ENCOUNTER — Ambulatory Visit: Payer: Medicare Other

## 2018-01-15 ENCOUNTER — Telehealth: Payer: Self-pay | Admitting: Family Medicine

## 2018-01-15 DIAGNOSIS — M545 Low back pain, unspecified: Secondary | ICD-10-CM | POA: Insufficient documentation

## 2018-01-15 NOTE — Progress Notes (Signed)
   Subjective:    Patient ID: Erin Esparza, female    DOB: Oct 12, 1940, 77 y.o.   MRN: [id number] Erin Esparza is accompanied by young grandchild Sources of clinical information for visit is/are patient and past medical records. Nursing assessment for this office visit was reviewed with the patient for accuracy and revision.  Previous Report(s) Reviewed: historical medical records Depression screen [facility] 2/9 01/14/2018  Decreased Interest 0  Down, Depressed, Hopeless 0  PHQ - 2 Score 0  Some recent data might be hidden   Fall Risk  01/14/2018 11/10/2017 08/05/2017 03/03/2017 10/14/2016  Falls in the past year? No No No No No    HPI  Lumbar back pain Location: Left lower back and upper lateral left buttock  Quality: constant aching  Onset: day prior to visit, spontaneous onset, no recalled trauma or heavy lifting/work Worse with: leaning torso towards left side  Better with: leaning towards right side Radiation: none Trauma: none  Red Flags Fecal/urinary incontinence: no  Numbness/Weakness: no  Fever/chills/sweats: no  Unexplained weight loss: no  h/o cancer/immunosuppression: History of breast cancer, Stage 1A 2013. S/p left breast lumpectomy 2013 IV drug use: no  PMH of osteoporosis or chronic steroid use: no, DEXA of Left forearm  2016 T score (+) 1.5   SH: No smoking, No alcohol   Review of Systems No Falls No joint swelling or increase joint warmth   no dysuria Objective:   Physical Exam  Back Exam: Inspection: no overt shape changes Motion: Limited lateral bend to left compared to lateral bend to right SLR seated:                          SLR lying: XSLR seated: neg                       XSLL: neg Seated HS Flexibility: limited Palpable tenderness: tender left lower lumber paraspinal region Sensory change: intact touch bilateral foot dorsum Reflex change:  Strength at foot Plantar-flexion:  5/ 5    Dorsi-flexion:  5/ 5  Leg strength Quad: 5 / 5   Hamstring: 5 /  5 Gait: nonatalgic but using a rolling walker    Assessment & Plan:  Visit Problem List with A/P  Low back pain without sciatica New problem with no further workup at this time.  Ddx: Musculoskeletal, osteoarthritis, degenerative disk disease, Spinal stenosis Compression fracture Conservative therapy: Prednisone 3 days, tramadol every 8 hours prn, heat.

## 2018-01-15 NOTE — Assessment & Plan Note (Addendum)
New problem with no further workup at this time.  Ddx: Musculoskeletal, osteoarthritis, degenerative disk disease, Spinal stenosis Compression fracture Conservative therapy: Prednisone 3 days, tramadol every 8 hours prn, heat.

## 2018-01-15 NOTE — Telephone Encounter (Signed)
I spoke with Ms Erin Esparza' dgt. [name] reported that the tramadol is not lasting 8 hours.  Plan Increase tramadol to every 6 hours if needed.  Dr McDiarmid would call in more tramadol if she ran out of her tramadol early from this change in dosing schedule.   If [name] is not at least 50% better by 01/18/18, she will contact [facility] to let Dr McDiarmid know.  We will then  get a Lumbar spine XR series.

## 2018-01-18 ENCOUNTER — Telehealth: Payer: Self-pay

## 2018-01-18 NOTE — Telephone Encounter (Signed)
Pt called nurse line, states she was seen last week for back pain. States she is still in a lot of pain and wants to know if she needs to increase her medications or if she needs to be seen again. Call back number [phone] Wallace Cullens, RN

## 2018-01-19 ENCOUNTER — Other Ambulatory Visit: Payer: Self-pay | Admitting: Family Medicine

## 2018-01-19 ENCOUNTER — Telehealth: Payer: Self-pay | Admitting: Family Medicine

## 2018-01-19 ENCOUNTER — Ambulatory Visit
Admission: RE | Admit: 2018-01-19 | Discharge: 2018-01-19 | Disposition: A | Discharge status: Home / Self Care | Payer: Medicare Other | Source: Ambulatory Visit | Attending: Family Medicine | Admitting: Family Medicine

## 2018-01-19 DIAGNOSIS — M545 Low back pain, unspecified: Secondary | ICD-10-CM

## 2018-01-19 NOTE — Telephone Encounter (Signed)
Pt would like to know if her results are in from her x-ray she had this morning. Please advise

## 2018-01-19 NOTE — Telephone Encounter (Signed)
I spoke with Erin Esparza. She continues to have "hip pain" which is in low left lumbar and upper left buttocks area.  No radiation below buttock. No improvement with pulse dose of prednisone.  Only transient relief of pain with tramadol 50 mg q6h  Requested Erin Esparza to go for a lumbar spine XR at [facility] on [address].    If no evidence of bone injury, will refer to PT for soft tissue release, HEP and possible dry needling of tender points in left buttock and left oswer lumbar paraspinal muscles.  May increase analgesia for acute pain to oxycodone 2.5 mg q6h and stop tramadol.

## 2018-01-19 NOTE — Telephone Encounter (Signed)
**  After Hours/ Emergency Line Call*  Received a call to report that Erin Esparza would like her Xray results and something sent in for back pain.  Endorsing her normal lower back pain.  Denying loss of feces, loss of urine, paresthesias, weakness or fevers.  Recommended that patient call back during normal business hours and we do not do new pain medications over the phone. She is agreeable.  Red flags discussed.  Will forward to PCP.  Everrett Coombe, MD PGY-3 [facility]

## 2018-01-20 ENCOUNTER — Other Ambulatory Visit: Payer: Self-pay | Admitting: Family Medicine

## 2018-01-20 ENCOUNTER — Telehealth: Payer: Self-pay

## 2018-01-20 DIAGNOSIS — M545 Low back pain, unspecified: Secondary | ICD-10-CM

## 2018-01-20 DIAGNOSIS — M5442 Lumbago with sciatica, left side: Secondary | ICD-10-CM

## 2018-01-20 MED ORDER — OXYCODONE HCL 5 MG PO TABS: 2.5000 mg | ORAL_TABLET | Freq: Four times a day (QID) | ORAL | 0 refills | Status: DC | PRN

## 2018-01-20 MED ORDER — SENNA 8.6 MG PO TABS: 1.0000 | ORAL_TABLET | Freq: Every day | ORAL | 0 refills | Status: DC

## 2018-01-20 NOTE — Telephone Encounter (Signed)
I spoke with Erin Esparza. Her back pain is improving but her Left buttock pain is the same. The pain is worse with transitions from sitting to standing and with staying stood.  She denies radiation below buttock.  She tried 2 tramadol tab at a time with minimal improvement in pain.   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  No evidence of vertebral fx.  Working diagnosis of nonspecific acute back pain without radiculopathy.  Plan Stop tramadol.  Will cancel currently available refill.  Start oxycodone 2.5 mg QID prn Start stool softener Continue APAP TID Referral to PT for soft tissue release, HEP, possible dry needling of tender points in paralumbar spine mm and left upper buttock pain.   Follow by phone Return precautions reviewed.

## 2018-01-20 NOTE — Telephone Encounter (Signed)
Patient calling for xray results.  Call back is [phone]  Danley Danker, RN [facility] [place])

## 2018-01-20 NOTE — Telephone Encounter (Signed)
Pt called nurse line requesting call back from Dr. Wendy Poet. Call back number [phone] Wallace Cullens, RN

## 2018-01-22 ENCOUNTER — Other Ambulatory Visit: Payer: Self-pay | Admitting: Family Medicine

## 2018-01-22 NOTE — Telephone Encounter (Signed)
Per Dr. McDiarmid's last note at her office visit, was advised to discontinue tramadol.

## 2018-01-26 ENCOUNTER — Other Ambulatory Visit: Payer: Self-pay | Admitting: Family Medicine

## 2018-01-27 ENCOUNTER — Other Ambulatory Visit: Payer: Self-pay

## 2018-01-27 NOTE — Telephone Encounter (Signed)
Will forward to PCP  Bacigalupo, Dionne Bucy, MD, MPH [facility] 01/27/2018 9:11 AM

## 2018-01-28 ENCOUNTER — Other Ambulatory Visit: Payer: Self-pay

## 2018-01-28 DIAGNOSIS — M545 Low back pain, unspecified: Secondary | ICD-10-CM

## 2018-01-28 MED ORDER — HYDROCHLOROTHIAZIDE 25 MG PO TABS: 25.0000 mg | ORAL_TABLET | Freq: Every day | ORAL | 1 refills | Status: DC

## 2018-01-28 NOTE — Telephone Encounter (Signed)
Patient left message asking for refill on pain medication. Stated pain has decreased some but still present.  Call back is [phone]  Danley Danker, RN [facility])

## 2018-01-29 MED ORDER — OXYCODONE HCL 5 MG PO TABS: 2.5000 mg | ORAL_TABLET | Freq: Four times a day (QID) | ORAL | 0 refills | Status: DC | PRN

## 2018-02-05 ENCOUNTER — Encounter: Payer: Self-pay | Admitting: Physical Therapy

## 2018-02-05 ENCOUNTER — Ambulatory Visit: Payer: Medicare Other | Attending: Family Medicine | Admitting: Physical Therapy

## 2018-02-05 ENCOUNTER — Other Ambulatory Visit: Payer: Self-pay

## 2018-02-05 DIAGNOSIS — M79605 Pain in left leg: Secondary | ICD-10-CM | POA: Diagnosis not present

## 2018-02-05 DIAGNOSIS — M5440 Lumbago with sciatica, unspecified side: Secondary | ICD-10-CM

## 2018-02-05 DIAGNOSIS — M6281 Muscle weakness (generalized): Secondary | ICD-10-CM | POA: Diagnosis not present

## 2018-02-05 DIAGNOSIS — R262 Difficulty in walking, not elsewhere classified: Secondary | ICD-10-CM | POA: Diagnosis not present

## 2018-02-05 NOTE — Therapy (Signed)
[place], [place], [address] Phone: [phone]   Fax:  [phone]  Physical Therapy Evaluation  Patient Details  Name: Erin Esparza MRN: [id number] Date of Birth: [birth year] Referring Provider: Dr. Wendy Poet   Encounter Date: 02/05/2018  PT End of Session - 02/05/18 1155    Visit Number  1    Number of Visits  12    Date for PT Re-Evaluation  03/19/18    PT Start Time  1100    PT Stop Time  1146    PT Time Calculation (min)  46 min    Activity Tolerance  Patient tolerated treatment well    Behavior During Therapy  [facility] for tasks assessed/performed       Past Medical History:  Diagnosis Date  . Abdominal pain 10/17/2016  . Arthritis    "both feet" (10/17/2016)  . Asthma   . Breast cancer ([place]) 01/06/12   left breast invasive ductal ca,dcis,ER/PR=+,  . Breast mass in female   . Clotting disorder ([place])   . Diastolic congestive heart failure ([place])   . DVT (deep venous thrombosis) ([place]) 1990's?   LLE  . Hypertension   . Insulin resistance   . Pulmonary embolism ([place]) ~ 2012  . Radiation 02/12/12 -03/12/12   left breast, total 50gy  . Right ankle pain   . Shortness of breath   . Venous stasis of lower extremity   . Wears dentures    upper  . Wears glasses   . Wears partial dentures    lower    Past Surgical History:  Procedure Laterality Date  . BREAST BIOPSY Left 11/03/11   10 o'clock=benign breast parenchyma  . BREAST LUMPECTOMY Left 01/06/12    Dr. Marcello Moores Cornett    There were no vitals filed for this visit.   Subjective Assessment - 02/05/18 1057    Subjective  Pt presents for evaluation of pain in her L hip and thigh.  She denies low back pain at this time but did have it a couple weeks.  She noticed it about 2-3 weeks ago.  She had severe pain at that time, had to use her walker and could "barely move".  The pain in her leg began soon after and now she feels it when she is standing  and walking.  Denies numbness, weakness.  Can only walk short distances in her home (to and from bathrom) and in the community.  Did have a good day Monday. She had an XR.     Limitations  Sitting;Lifting;Standing;Walking;House hold activities    How long can you sit comfortably?  not limited     How long can you stand comfortably?  <5 min     How long can you walk comfortably?  <5 min     Diagnostic tests  XR showed spurring upper lumbar and lower thoracic spine    Patient Stated Goals  Walk without cane.  No pain.     Currently in Pain?  Yes    Pain Score  4     Pain Location  Hip    Pain Orientation  Left    Pain Descriptors / Indicators  Radiating;Aching;Dull    Pain Type  Acute pain    Pain Radiating Towards  L thigh     Pain Onset  1 to 4 weeks ago    Pain Frequency  Intermittent    Aggravating Factors   standing, walking even <5 minutes  Pain Relieving Factors  sitting, heat or ice , pain meds     Effect of Pain on Daily Activities  limits mobility in her home . Was exercising a few months ago and had to stope because she keeps her granddaughter daily.     Multiple Pain Sites  No         [facility] PT Assessment - 02/05/18 0001      Assessment   Medical Diagnosis  low back pain     Referring Provider  Dr. McDiarmid    Onset Date/Surgical Date  --   3 mos    Next MD Visit  unsure     Prior Therapy  No       Precautions   Precautions  None      Restrictions   Weight Bearing Restrictions  No      Balance Screen   Has the patient fallen in the past 6 months  No    Has the patient had a decrease in activity level because of a fear of falling?   Yes    Is the patient reluctant to leave their home because of a fear of falling?   No      Home Environment   Living Environment  Private residence    Living Arrangements  Children    Type of [place] Access  Level entry    Home Layout  One level    [place] - 2 wheels;Cane - quad      Prior  Function   Level of Independence  Independent with basic ADLs;Independent with gait;Independent with transfers;Needs assistance with homemaking      Cognition   Overall Cognitive Status  Within Functional Limits for tasks assessed      Observation/Other Assessments   Focus on Therapeutic Outcomes (FOTO)   NT       Observation/Other Assessments-Edema    Edema  --   lower leg swelling bilat     Sensation   Light Touch  Appears Intact      Single Leg Stance   Comments  very brief hover Rt LE, unable on L       Sit to Stand   Comments  needs UE assist       Posture/Postural Control   Posture/Postural Control  Postural limitations    Postural Limitations  Rounded Shoulders;Forward head;Flexed trunk;Weight shift right      AROM   Overall AROM Comments  pain with L sidebending, extension and rotation to L     Lumbar Flexion  WFL    Lumbar Extension  90%     Lumbar - Right Side Bend  no pain     Lumbar - Left Side Bend  pain     Lumbar - Right Rotation  WNL    Lumbar - Left Rotation  pain, 25%       PROM   Overall PROM Comments  hips WFL , no pain       Strength   Right Hip Flexion  4/5    Left Hip Flexion  3-/5    Left Hip ABduction  2+/5    Right Knee Flexion  5/5    Right Knee Extension  4+/5    Left Knee Flexion  5/5    Left Knee Extension  4+/5      Palpation   Palpation comment  TTP L greater trochanter extending into distal thigh , none in L spine  Special Tests    Special Tests  Lumbar    Other special tests  neg SLR but pain in L ant thigh with SLR on L       Bed Mobility   Bed Mobility  Supine to Sit    Supine to Sit  Set up assist      Transfers   Transfers  Sit to Stand    Sit to Stand  6: Modified independent (Device/Increase time)      Ambulation/Gait   Ambulation Distance (Feet)  150 Feet    Assistive device  Small based quad cane    Gait Pattern  Decreased stance time - left;Antalgic;Trunk flexed         Objective measurements  completed on examination: See above findings.      [place] Adult PT Treatment/Exercise - 02/05/18 0001      Self-Care   Posture  standing     Heat/Ice Application  Heat post session for soreness    Other Self-Care Comments   HEP, eval, referral of pain , cane and gait       Lumbar Exercises: Stretches   Active Hamstring Stretch  Right;Left;2 reps    Active Hamstring Stretch Limitations  limited , seated     Single Knee to Chest Stretch  3 reps;10 seconds    Single Knee to Chest Stretch Limitations  LLE     Lower Trunk Rotation  10 seconds    Lower Trunk Rotation Limitations  x10     Hip Flexor Stretch  Left;1 rep    Hip Flexor Stretch Limitations  unable due to pain     Pelvic Tilt  10 reps    Other Lumbar Stretch Exercise  used wedge       Modalities   Modalities  Moist Heat      Moist Heat Therapy   Number Minutes Moist Heat  10 Minutes    Moist Heat Location  Hip   in waiting area             PT Education - 02/05/18 1155    Education Details  PT/POC, HEP, eval findings and the relationship of back and referred leg pain     Person(s) Educated  Patient    Methods  Explanation;Demonstration;Handout    Comprehension  Verbalized understanding;Need further instruction          PT Long Term Goals - 02/05/18 1156      PT LONG TERM GOAL #1   Title  Pt will be I with HEP for back, LE strength     Time  6    Period  Weeks    Status  New    Target Date  03/12/18      PT LONG TERM GOAL #2   Title  Pt will be able to L lift leg independently to for bed, car transfers    Time  6    Period  Weeks    Status  New    Target Date  03/12/18      PT LONG TERM GOAL #3   Title  Pt will be able to stand to wash dishes with pain <3/10 for 10 min     Time  6    Period  Weeks    Status  New    Target Date  03/12/18      PT LONG TERM GOAL #4   Title  Pt will be able to walk to and from the bathroom in her home  without increased leg pain     Time  6    Period  Weeks     Status  New    Target Date  03/12/18             Plan - 02/05/18 1158    Clinical Impression Statement  Pt presents for low complexity eval of low back and L LE pain. Her symptoms are consistent with XR findings.  Pain limits her ability to stand, walk and change positions. She did have less difficulty walking out of the clinic but standing to make her appts increased pain in LLE.  Very min back pain overall.      History and Personal Factors relevant to plan of care:  obesity, knee pain, breast CA, HTN, prediabetic     Clinical Presentation  Evolving    Clinical Presentation due to:  pain changing to more distal, improved and now worsened.     Clinical Decision Making  Moderate    Rehab Potential  Excellent    PT Frequency  2x / week    PT Duration  6 weeks    PT Treatment/Interventions  ADLs/Self Care Home Management;Electrical Stimulation;Functional mobility training;Therapeutic activities;Manual techniques;Passive range of motion;Ultrasound;Traction;Moist Heat;Therapeutic exercise;Cryotherapy;Gait training;Patient/family education    PT Next Visit Plan  check HEP, core strength seated , LE strength, Nustep     PT Home Exercise Plan  LTR, hamstring stretch, seated march, PPT, knee to chest     Consulted and Agree with Plan of Care  Patient       Patient will benefit from skilled therapeutic intervention in order to improve the following deficits and impairments:  Abnormal gait, Decreased endurance, Decreased mobility, Difficulty walking, Obesity, Increased edema, Decreased range of motion, Decreased activity tolerance, Decreased strength, Increased fascial restricitons, Impaired flexibility, Pain, Postural dysfunction  Visit Diagnosis: Pain in left leg  Acute left-sided low back pain with sciatica, sciatica laterality unspecified  Difficulty in walking, not elsewhere classified  Muscle weakness (generalized)     Problem List Patient Active Problem List   Diagnosis Date  Noted  . Low back pain without sciatica 01/15/2018  . Cough 06/30/2017  . Weakness 04/13/2017  . Enteritis   . Other ascites   . Urinary tract infection without hematuria   . Left upper arm pain 10/14/2016  . Prediabetes 02/27/2016  . Plantar fasciitis of right foot 02/27/2014  . [place] arthritis 06/15/2013  . Pain of skin 02/18/2013  . Paresthesia of right leg 09/30/2012  . Venous stasis of lower extremity   . Preventative health care 03/05/2012  . Primary cancer of upper inner quadrant of left female breast ([place]) 01/20/2012  . 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 Breast cancer ([place]) 01/06/2012  . Leg swelling 04/09/2011  . 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 STASIS DERMATITIS, CHRONIC 10/25/2009  . DEEP VENOUS THROMBOPHLEBITIS, HX OF 07/28/2008  . OBESITY 01/27/2008  . HYPERTENSION, BENIGN ESSENTIAL 09/24/2006  . ASTHMA, PERSISTENT 09/24/2006  . DEGENERATIVE JOINT DISEASE, KNEE 09/04/2006    PAA,JENNIFER 02/05/2018, 12:12 PM  [facility] [address] [place], [place], [address] Phone: [phone]   Fax:  [phone]  Name: Erin Esparza MRN: [id number] Date of Birth: [birth year]   Raeford Razor, PT 02/05/18 12:13 PM Phone: [phone] Fax: [phone]

## 2018-02-08 ENCOUNTER — Ambulatory Visit: Payer: Medicare Other | Admitting: Physical Therapy

## 2018-02-08 DIAGNOSIS — M79605 Pain in left leg: Secondary | ICD-10-CM

## 2018-02-08 DIAGNOSIS — M6281 Muscle weakness (generalized): Secondary | ICD-10-CM

## 2018-02-08 DIAGNOSIS — R262 Difficulty in walking, not elsewhere classified: Secondary | ICD-10-CM | POA: Diagnosis not present

## 2018-02-08 DIAGNOSIS — M5440 Lumbago with sciatica, unspecified side: Secondary | ICD-10-CM | POA: Diagnosis not present

## 2018-02-08 NOTE — Patient Instructions (Addendum)
Hip Flexion (Standing)    Stand with support.  Lift left  knee upward. Hold for _5__ seconds.  Repeat _10__ times.Do 2 sets.  Do _2__ times a day. Repeat in opposite side.   Adduction: Hip - Knees Together (Sitting)    Sit with towel roll between knees. Push knees together. Hold for __5_ seconds. Repeat _10__ times.Do 2 sets  Do _2__ times a day.  Copyright  VHI. All rights reserved.   Abduction: Clam (Eccentric) - Side-Lying    Lie on side with knees bent. Lift top knee, keeping feet together. Keep trunk steady. Slowly lower for 3-5 seconds. _10_ reps per set, _2__ sets per day, __7_ days per week.

## 2018-02-10 ENCOUNTER — Telehealth: Payer: Self-pay

## 2018-02-10 ENCOUNTER — Other Ambulatory Visit: Payer: Self-pay | Admitting: Family Medicine

## 2018-02-10 DIAGNOSIS — M545 Low back pain, unspecified: Secondary | ICD-10-CM

## 2018-02-10 NOTE — Telephone Encounter (Signed)
Please let Ms Jacquet know that:  Dr McDiarmid will make a referral to orthopedist for evaluation and treatment of this persistent pain.   Please continue going for physical therapy.  If need pain medication, Dr McDiarmid can prescribe tramadol again.

## 2018-02-10 NOTE — Telephone Encounter (Signed)
Patient left message that her pain is no better. Has completed all the pain medication and is only taking Tylenol now. Wants to know if she should return to office or what next step should be.  Call back is [phone]  Danley Danker, RN [facility])

## 2018-02-10 NOTE — Telephone Encounter (Signed)
Patient informed of referral and she voiced understanding.  States that she would like to get another script for the tramadol.  Pharmacy verified.  Jazmin Hartsell,CMA

## 2018-02-10 NOTE — Therapy (Signed)
[place], [place], [address] Phone: [phone]   Fax:  [phone]  Physical Therapy Treatment  Patient Details  Name: Erin Esparza MRN: [id number] Date of Birth: 01-30-1941 Referring Provider: Dr. Wendy Poet   Encounter Date: 02/08/2018   Visit# 2 [place] Visits:  12 Date for Re-evaluation 03/19/2018  Time in: 1305 Time Out: 1345 Total: 40 min    Past Medical History:  Diagnosis Date  . Abdominal pain 10/17/2016  . Arthritis    "both feet" (10/17/2016)  . Asthma   . Breast cancer ([place]) 01/06/12   left breast invasive ductal ca,dcis,ER/PR=+,  . Breast mass in female   . Clotting disorder ([place])   . Diastolic congestive heart failure ([place])   . DVT (deep venous thrombosis) ([place]) 1990's?   LLE  . Hypertension   . Insulin resistance   . Pulmonary embolism ([place]) ~ 2012  . Radiation 02/12/12 -03/12/12   left breast, total 50gy  . Right ankle pain   . Shortness of breath   . Venous stasis of lower extremity   . Wears dentures    upper  . Wears glasses   . Wears partial dentures    lower    Past Surgical History:  Procedure Laterality Date  . BREAST BIOPSY Left 11/03/11   10 o'clock=benign breast parenchyma  . BREAST LUMPECTOMY Left 01/06/12    Dr. Marcello Moores Cornett    There were no vitals filed for this visit.                    [place] Adult PT Treatment/Exercise - 02/08/18 0001      Exercises   Exercises  Lumbar      Lumbar Exercises: Stretches   Active Hamstring Stretch  Right;Left;2 reps    Active Hamstring Stretch Limitations  seated    Lower Trunk Rotation  10 seconds    Lower Trunk Rotation Limitations  x10     Pelvic Tilt  10 reps    Other Lumbar Stretch Exercise  used wedge       Lumbar Exercises: Supine   Heel Slides Limitations  assisted with strap     Bridge  10 reps    Bridge Limitations  PTA assist to hold LLE                   PT Long Term  Goals - 02/05/18 1156      PT LONG TERM GOAL #1   Title  Pt will be I with HEP for back, LE strength     Time  6    Period  Weeks    Status  New    Target Date  03/12/18      PT LONG TERM GOAL #2   Title  Pt will be able to L lift leg independently to for bed, car transfers    Time  6    Period  Weeks    Status  New    Target Date  03/12/18      PT LONG TERM GOAL #3   Title  Pt will be able to stand to wash dishes with pain <3/10 for 10 min     Time  6    Period  Weeks    Status  New    Target Date  03/12/18      PT LONG TERM GOAL #4   Title  Pt will be able to walk to and from the bathroom  in her home without increased leg pain     Time  6    Period  Weeks    Status  New    Target Date  03/12/18       Clinical Impression: Needs AA to achieve and maintain hooklying position due to left hip flexor weakness. Tolerated Nustep well with some c/o left knee pain. Did well with standing hip flexion and abduction with decreased reps tolerated on left.   Next treatment plan:  check HEP, core strength seated , LE strength, Nustep         Patient will benefit from skilled therapeutic intervention in order to improve the following deficits and impairments:  Abnormal gait, Decreased endurance, Decreased mobility, Difficulty walking, Obesity, Increased edema, Decreased range of motion, Decreased activity tolerance, Decreased strength, Increased fascial restricitons, Impaired flexibility, Pain, Postural dysfunction  Visit Diagnosis: Pain in left leg  Acute left-sided low back pain with sciatica, sciatica laterality unspecified  Difficulty in walking, not elsewhere classified  Muscle weakness (generalized)     Problem List Patient Active Problem List   Diagnosis Date Noted  . Low back pain without sciatica 01/15/2018  . Cough 06/30/2017  . Weakness 04/13/2017  . Enteritis   . Other ascites   . Urinary tract infection without hematuria   . Left upper arm pain  10/14/2016  . Prediabetes 02/27/2016  . Plantar fasciitis of right foot 02/27/2014  . [place] arthritis 06/15/2013  . Pain of skin 02/18/2013  . Paresthesia of right leg 09/30/2012  . Venous stasis of lower extremity   . Preventative health care 03/05/2012  . Primary cancer of upper inner quadrant of left female breast ([place]) 01/20/2012  . 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 Breast cancer ([place]) 01/06/2012  . Leg swelling 04/09/2011  . 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 STASIS DERMATITIS, CHRONIC 10/25/2009  . DEEP VENOUS THROMBOPHLEBITIS, HX OF 07/28/2008  . OBESITY 01/27/2008  . HYPERTENSION, BENIGN ESSENTIAL 09/24/2006  . ASTHMA, PERSISTENT 09/24/2006  . DEGENERATIVE JOINT DISEASE, KNEE 09/04/2006    Dorene Ar, PTA 02/10/2018, 8:19 AM  [place], [place], [address] Phone: [phone]   Fax:  [phone]  Name: DYNEISHA MURCHISON MRN: [id number] Date of Birth: [birth year]

## 2018-02-11 MED ORDER — TRAMADOL HCL 50 MG PO TABS: 50.0000 mg | ORAL_TABLET | Freq: Three times a day (TID) | ORAL | 0 refills | Status: DC | PRN

## 2018-02-11 NOTE — Telephone Encounter (Signed)
Rx for Tramadol sent to pharmacy. °

## 2018-02-16 ENCOUNTER — Encounter: Payer: Self-pay | Admitting: Physical Therapy

## 2018-02-16 ENCOUNTER — Ambulatory Visit: Payer: Medicare Other | Admitting: Physical Therapy

## 2018-02-16 DIAGNOSIS — R262 Difficulty in walking, not elsewhere classified: Secondary | ICD-10-CM | POA: Diagnosis not present

## 2018-02-16 DIAGNOSIS — M79605 Pain in left leg: Secondary | ICD-10-CM | POA: Diagnosis not present

## 2018-02-16 DIAGNOSIS — M6281 Muscle weakness (generalized): Secondary | ICD-10-CM | POA: Diagnosis not present

## 2018-02-16 DIAGNOSIS — M5440 Lumbago with sciatica, unspecified side: Secondary | ICD-10-CM | POA: Diagnosis not present

## 2018-02-16 NOTE — Therapy (Signed)
[place], [place], [address] Phone: [phone]   Fax:  [phone]  Physical Therapy Treatment  Patient Details  Name: Erin Esparza MRN: [id number] Date of Birth: Nov 12, 1940 Referring Provider: Dr. Wendy Poet   Encounter Date: 02/16/2018  PT End of Session - 02/16/18 1123    Visit Number  3    Number of Visits  12    Date for PT Re-Evaluation  03/19/18    PT Start Time  1107    PT Stop Time  1152    PT Time Calculation (min)  45 min    Activity Tolerance  Patient tolerated treatment well    Behavior During Therapy  [facility] for tasks assessed/performed       Past Medical History:  Diagnosis Date  . Abdominal pain 10/17/2016  . Arthritis    "both feet" (10/17/2016)  . Asthma   . Breast cancer ([place]) 01/06/12   left breast invasive ductal ca,dcis,ER/PR=+,  . Breast mass in female   . Clotting disorder ([place])   . Diastolic congestive heart failure ([place])   . DVT (deep venous thrombosis) ([place]) 1990's?   LLE  . Hypertension   . Insulin resistance   . Pulmonary embolism ([place]) ~ 2012  . Radiation 02/12/12 -03/12/12   left breast, total 50gy  . Right ankle pain   . Shortness of breath   . Venous stasis of lower extremity   . Wears dentures    upper  . Wears glasses   . Wears partial dentures    lower    Past Surgical History:  Procedure Laterality Date  . BREAST BIOPSY Left 11/03/11   10 o'clock=benign breast parenchyma  . BREAST LUMPECTOMY Left 01/06/12    Dr. Marcello Moores Cornett    There were no vitals filed for this visit.  Subjective Assessment - 02/16/18 1108    Subjective  Had more pain last week.  Hurts a little today.  Got some Tramadol.  L ant hip, thigh pain.     Currently in Pain?  Yes    Pain Score  3     Pain Location  Hip    Pain Orientation  Left    Pain Descriptors / Indicators  Aching    Pain Type  Acute pain    Pain Onset  1 to 4 weeks ago    Pain Frequency  Intermittent    Aggravating Factors   stand, walking     Pain Relieving Factors  sit, rest, medicine          [facility] Adult PT Treatment/Exercise - 02/16/18 0001      Lumbar Exercises: Stretches   Active Hamstring Stretch  2 reps;30 seconds    Lower Trunk Rotation  10 seconds    Lower Trunk Rotation Limitations  x10     Pelvic Tilt  10 reps    Other Lumbar Stretch Exercise  max cues       Lumbar Exercises: Supine   Bridge  10 reps    Bridge Limitations  small ROM cues       Knee/Hip Exercises: Stretches   Hip Flexor Stretch  Left;3 reps    Hip Flexor Stretch Limitations  standing       Knee/Hip Exercises: Aerobic   Nustep  LE and UE for 5 min for warm up       Knee/Hip Exercises: Standing   Other Standing Knee Exercises  high march x 15 1 UE assist, post stretching, felt  better       Knee/Hip Exercises: Supine   Quad Sets  Strengthening;Both;1 set;15 reps    Bridges  Strengthening;Both;1 set;10 reps      Knee/Hip Exercises: Sidelying   Clams  x 15 manual cues       Moist Heat Therapy   Number Minutes Moist Heat  10 Minutes    Moist Heat Location  Hip      Manual Therapy   Manual therapy comments  IASTM with [place] to L anterior thigh   quads, prox.distal light to mod pressure to relax, calm             PT Education - 02/16/18 1136    Education Details  anterior hip stretch standing     Person(s) Educated  Patient    Methods  Explanation    Comprehension  Verbalized understanding          PT Long Term Goals - 02/16/18 1123      PT LONG TERM GOAL #1   Title  Pt will be I with HEP for back, LE strength     Status  On-going      PT LONG TERM GOAL #2   Title  Pt will be able to L lift leg independently to for bed, car transfers    Status  On-going      PT LONG TERM GOAL #3   Title  Pt will be able to stand to wash dishes with pain <3/10 for 10 min     Status  On-going      PT LONG TERM GOAL #4   Title  Pt will be able to walk to and from the bathroom in her  home without increased leg pain     Status  On-going            Plan - 02/16/18 1131    Clinical Impression Statement  Patient has not been doing her HEP much.  Walking without her can today, seemed fairly steady. She cont to need A for LLE.  Seeing an Orthopedic next week? Able to effectively stretch L hip flexors with chair support.      PT Treatment/Interventions  ADLs/Self Care Home Management;Electrical Stimulation;Functional mobility training;Therapeutic activities;Manual techniques;Passive range of motion;Ultrasound;Traction;Moist Heat;Therapeutic exercise;Cryotherapy;Gait training;Patient/family education    PT Next Visit Plan  check HEP, core strength seated , LE strength, Nustep     PT Home Exercise Plan  LTR, hamstring stretch, seated march, PPT, knee to chest , standing march, ball squeeze, side clam, standinh ant hip stretch     Consulted and Agree with Plan of Care  Patient       Patient will benefit from skilled therapeutic intervention in order to improve the following deficits and impairments:  Abnormal gait, Decreased endurance, Decreased mobility, Difficulty walking, Obesity, Increased edema, Decreased range of motion, Decreased activity tolerance, Decreased strength, Increased fascial restricitons, Impaired flexibility, Pain, Postural dysfunction  Visit Diagnosis: Pain in left leg  Acute left-sided low back pain with sciatica, sciatica laterality unspecified  Difficulty in walking, not elsewhere classified  Muscle weakness (generalized)     Problem List Patient Active Problem List   Diagnosis Date Noted  . Low back pain without sciatica 01/15/2018  . Cough 06/30/2017  . Weakness 04/13/2017  . Enteritis   . Other ascites   . Urinary tract infection without hematuria   . Left upper arm pain 10/14/2016  . Prediabetes 02/27/2016  . Plantar fasciitis of right foot 02/27/2014  . [place]  arthritis 06/15/2013  . Pain of skin 02/18/2013  . Paresthesia of right  leg 09/30/2012  . Venous stasis of lower extremity   . Preventative health care 03/05/2012  . Primary cancer of upper inner quadrant of left female breast ([place]) 01/20/2012  . 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 Breast cancer ([place]) 01/06/2012  . Leg swelling 04/09/2011  . 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 STASIS DERMATITIS, CHRONIC 10/25/2009  . DEEP VENOUS THROMBOPHLEBITIS, HX OF 07/28/2008  . OBESITY 01/27/2008  . HYPERTENSION, BENIGN ESSENTIAL 09/24/2006  . ASTHMA, PERSISTENT 09/24/2006  . DEGENERATIVE JOINT DISEASE, KNEE 09/04/2006    PAA,JENNIFER 02/16/2018, 12:47 PM  [facility] [address] [place], [place], [address] Phone: [phone]   Fax:  [phone]  Name: LILLIS NUTTLE MRN: [id number] Date of Birth: November 08, 1940  Raeford Razor, PT 02/16/18 12:47 PM Phone: [phone] Fax: [phone]

## 2018-02-16 NOTE — Patient Instructions (Signed)
HIP: Short Hip Flexors - Standing    Place hand on wall for support. Place one leg in front of other. Bend both knees. Lift chest and rotate pelvis forward. Hold ___30 seconds. ___5 reps per set, _1__ sets per day, __7_ days per week  Keep chest lifted.  Hold to chair or wall.   Copyright  VHI. All rights reserved.

## 2018-02-17 ENCOUNTER — Encounter: Payer: Medicare Other | Admitting: Physical Therapy

## 2018-02-19 ENCOUNTER — Ambulatory Visit: Payer: Medicare Other | Admitting: Physical Therapy

## 2018-02-19 ENCOUNTER — Encounter: Payer: Self-pay | Admitting: Physical Therapy

## 2018-02-19 DIAGNOSIS — M79605 Pain in left leg: Secondary | ICD-10-CM

## 2018-02-19 DIAGNOSIS — M5440 Lumbago with sciatica, unspecified side: Secondary | ICD-10-CM | POA: Diagnosis not present

## 2018-02-19 DIAGNOSIS — M6281 Muscle weakness (generalized): Secondary | ICD-10-CM

## 2018-02-19 DIAGNOSIS — R262 Difficulty in walking, not elsewhere classified: Secondary | ICD-10-CM

## 2018-02-19 NOTE — Patient Instructions (Signed)
Heel Slide   Bend knee and pull heel toward buttocks. Hold __5__ seconds. Return. Repeat with other knee. Repeat __10__ times. Do __2__ sessions per day.    Slowly tighten muscles on thigh of straight leg while counting out loud to 5____. Repeat _20___ times. Do _2___ sessions per day.  http://gt2.exer.us/372   Copyright  VHI. All rights reserved.     Raise leg until knee is straight. _20__ reps per set, _2__ sets per day, __7_ days per week

## 2018-02-19 NOTE — Therapy (Signed)
[place], [place], [address] Phone: [phone]   Fax:  [phone]  Physical Therapy Treatment  Patient Details  Name: Erin Esparza MRN: [id number] Date of Birth: [birth year] Referring Provider: Dr. Wendy Poet   Encounter Date: 02/19/2018  PT End of Session - 02/19/18 1113    Visit Number  4    Number of Visits  12    Date for PT Re-Evaluation  03/19/18    PT Start Time  1109    PT Stop Time  1147    PT Time Calculation (min)  38 min       Past Medical History:  Diagnosis Date  . Abdominal pain 10/17/2016  . Arthritis    "both feet" (10/17/2016)  . Asthma   . Breast cancer ([place]) 01/06/12   left breast invasive ductal ca,dcis,ER/PR=+,  . Breast mass in female   . Clotting disorder ([place])   . Diastolic congestive heart failure ([place])   . DVT (deep venous thrombosis) ([place]) 1990's?   LLE  . Hypertension   . Insulin resistance   . Pulmonary embolism ([place]) ~ 2012  . Radiation 02/12/12 -03/12/12   left breast, total 50gy  . Right ankle pain   . Shortness of breath   . Venous stasis of lower extremity   . Wears dentures    upper  . Wears glasses   . Wears partial dentures    lower    Past Surgical History:  Procedure Laterality Date  . BREAST BIOPSY Left 11/03/11   10 o'clock=benign breast parenchyma  . BREAST LUMPECTOMY Left 01/06/12    Dr. Marcello Moores Cornett    There were no vitals filed for this visit.  Subjective Assessment - 02/19/18 1111    Subjective  Pain in thigh is better but now knee is hurting more. Thigh pain comes back when I stand.     Currently in Pain?  Yes    Pain Score  3     Pain Location  Knee    Pain Orientation  Left    Pain Descriptors / Indicators  Aching    Aggravating Factors   standing, walking     Pain Relieving Factors  sit, rest, meds                        [facility] Adult PT Treatment/Exercise - 02/19/18 0001      Exercises   Exercises  Knee/Hip       Lumbar Exercises: Stretches   Active Hamstring Stretch  2 reps;30 seconds    Lower Trunk Rotation  10 seconds    Lower Trunk Rotation Limitations  x10     Pelvic Tilt  10 reps    Other Lumbar Stretch Exercise  max cues       Knee/Hip Exercises: Stretches   Hip Flexor Stretch  Left;3 reps    Hip Flexor Stretch Limitations  standing       Knee/Hip Exercises: Aerobic   Nustep  LE and UE for 5 min for warm up       Knee/Hip Exercises: Standing   Heel Raises  15 reps    Other Standing Knee Exercises  standing 3 way hip bilateral x 10 each       Knee/Hip Exercises: Seated   Long Arc Quad  20 reps      Knee/Hip Exercises: Supine   Quad Sets  Strengthening;Both;1 set;15 reps    Quad Sets Limitations  with  ball squeeze    Bridges  Strengthening;Both;1 set;10 reps    Other Supine Knee/Hip Exercises  supine clam AROm, ball squeeze       Knee/Hip Exercises: Sidelying   Clams  x 15 each              PT Education - 02/19/18 1142    Education Details  HEP     Person(s) Educated  Patient    Methods  Explanation;Handout    Comprehension  Verbalized understanding          PT Long Term Goals - 02/16/18 1123      PT LONG TERM GOAL #1   Title  Pt will be I with HEP for back, LE strength     Status  On-going      PT LONG TERM GOAL #2   Title  Pt will be able to L lift leg independently to for bed, car transfers    Status  On-going      PT LONG TERM GOAL #3   Title  Pt will be able to stand to wash dishes with pain <3/10 for 10 min     Status  On-going      PT LONG TERM GOAL #4   Title  Pt will be able to walk to and from the bathroom in her home without increased leg pain     Status  On-going            Plan - 02/19/18 1113    Clinical Impression Statement  Pt reports improved ability to get leg on and off bed however still needs to use UE to lift LLE into car. Continued weakness evident with strengthening. Unable to lift leg in supine. Issue heel slides and  quad set and encouraged more HEP compliance.     PT Next Visit Plan  check HEP, core strength seated , LE strength, Nustep     PT Home Exercise Plan  LTR, hamstring stretch, seated march, PPT, knee to chest , standing march, ball squeeze, side clam, standinh ant hip stretch , heel slides, LAQ, quad set     Consulted and Agree with Plan of Care  Patient       Patient will benefit from skilled therapeutic intervention in order to improve the following deficits and impairments:  Abnormal gait, Decreased endurance, Decreased mobility, Difficulty walking, Obesity, Increased edema, Decreased range of motion, Decreased activity tolerance, Decreased strength, Increased fascial restricitons, Impaired flexibility, Pain, Postural dysfunction  Visit Diagnosis: Pain in left leg  Acute left-sided low back pain with sciatica, sciatica laterality unspecified  Difficulty in walking, not elsewhere classified  Muscle weakness (generalized)     Problem List Patient Active Problem List   Diagnosis Date Noted  . Low back pain without sciatica 01/15/2018  . Cough 06/30/2017  . Weakness 04/13/2017  . Enteritis   . Other ascites   . Urinary tract infection without hematuria   . Left upper arm pain 10/14/2016  . Prediabetes 02/27/2016  . Plantar fasciitis of right foot 02/27/2014  . [place] arthritis 06/15/2013  . Pain of skin 02/18/2013  . Paresthesia of right leg 09/30/2012  . Venous stasis of lower extremity   . Preventative health care 03/05/2012  . Primary cancer of upper inner quadrant of left female breast ([place]) 01/20/2012  . 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 Breast cancer ([place]) 01/06/2012  . Leg swelling 04/09/2011  . 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 STASIS DERMATITIS, CHRONIC 10/25/2009  .  DEEP VENOUS THROMBOPHLEBITIS, HX OF 07/28/2008  . OBESITY 01/27/2008  . HYPERTENSION, BENIGN ESSENTIAL 09/24/2006  . ASTHMA, PERSISTENT 09/24/2006  . DEGENERATIVE JOINT DISEASE, KNEE  09/04/2006    Dorene Ar, PTA 02/19/2018, 12:01 PM  [facility] [address] [place], [place], [address] Phone: [phone]   Fax:  [phone]  Name: Erin Esparza MRN: [id number] Date of Birth: 1940-07-29

## 2018-02-22 ENCOUNTER — Ambulatory Visit: Payer: Medicare Other | Admitting: Physical Therapy

## 2018-02-22 DIAGNOSIS — R262 Difficulty in walking, not elsewhere classified: Secondary | ICD-10-CM

## 2018-02-22 DIAGNOSIS — M5440 Lumbago with sciatica, unspecified side: Secondary | ICD-10-CM

## 2018-02-22 DIAGNOSIS — M79605 Pain in left leg: Secondary | ICD-10-CM

## 2018-02-22 DIAGNOSIS — M6281 Muscle weakness (generalized): Secondary | ICD-10-CM

## 2018-02-22 NOTE — Therapy (Signed)
[place], [place], [address] Phone: [phone]   Fax:  [phone]  Physical Therapy Treatment  Patient Details  Name: Erin Esparza MRN: [id number] Date of Birth: [birth year] Referring Provider: Dr. Wendy Poet   Encounter Date: 02/22/2018  PT End of Session - 02/22/18 0953    Visit Number  5    Number of Visits  12    Date for PT Re-Evaluation  03/19/18    PT Start Time  0931    PT Stop Time  1012    PT Time Calculation (min)  41 min    Activity Tolerance  Patient tolerated treatment well    Behavior During Therapy  [facility] for tasks assessed/performed       Past Medical History:  Diagnosis Date  . Abdominal pain 10/17/2016  . Arthritis    "both feet" (10/17/2016)  . Asthma   . Breast cancer ([place]) 01/06/12   left breast invasive ductal ca,dcis,ER/PR=+,  . Breast mass in female   . Clotting disorder ([place])   . Diastolic congestive heart failure ([place])   . DVT (deep venous thrombosis) ([place]) 1990's?   LLE  . Hypertension   . Insulin resistance   . Pulmonary embolism ([place]) ~ 2012  . Radiation 02/12/12 -03/12/12   left breast, total 50gy  . Right ankle pain   . Shortness of breath   . Venous stasis of lower extremity   . Wears dentures    upper  . Wears glasses   . Wears partial dentures    lower    Past Surgical History:  Procedure Laterality Date  . BREAST BIOPSY Left 11/03/11   10 o'clock=benign breast parenchyma  . BREAST LUMPECTOMY Left 01/06/12    Dr. Marcello Moores Cornett    There were no vitals filed for this visit.  Subjective Assessment - 02/22/18 0934    Subjective  No back pain right now.  Took meds this AM.  It always hurts in the AM    Currently in Pain?  No/denies          [facility] Adult PT Treatment/Exercise - 02/22/18 0001      Lumbar Exercises: Stretches   Active Hamstring Stretch  2 reps;30 seconds    Lower Trunk Rotation  10 seconds    Lower Trunk Rotation Limitations  x10      Hip Flexor Stretch  Left;1 rep;60 seconds    Hip Flexor Stretch Limitations  thigh stretch       Lumbar Exercises: Aerobic   Nustep  L5 UE and LE for 6 min       Lumbar Exercises: Seated   Other Seated Lumbar Exercises  seated core on balance disc: trunk rotation, shoulder/thoracic ext, knee lifts , knee ext x 10 each     Other Seated Lumbar Exercises  shoulder horizontal abduction red x 10       Lumbar Exercises: Supine   Ab Set  10 reps    AB Set Limitations  with black swiss ball     Pelvic Tilt  10 reps    Pelvic Tilt Limitations  post tilt     Clam  10 reps    Bent Knee Raise  10 reps    Bent Knee Raise Limitations  core march       Knee/Hip Exercises: Stretches   Hip Flexor Stretch  Left;3 reps    Hip Flexor Stretch Limitations  standing       Knee/Hip Exercises: Standing  Other Standing Knee Exercises  step ups 4 inch x 15, then hip abduction x 10 each side                   PT Long Term Goals - 02/22/18 1006      PT LONG TERM GOAL #1   Title  Pt will be I with HEP for back, LE strength     Status  On-going      PT LONG TERM GOAL #2   Title  Pt will be able to L lift leg independently to for bed, car transfers    Status  On-going      PT LONG TERM GOAL #3   Title  Pt will be able to stand to wash dishes with pain <3/10 for 10 min     Status  On-going      PT LONG TERM GOAL #4   Title  Pt will be able to walk to and from the bathroom in her home without increased leg pain     Status  On-going            Plan - 02/22/18 0939    Clinical Impression Statement  Trending towards improvement, able to stand a bit longer this weekend with less pain. Needs cues for HEP but reports doing the anterior hip stretch this weekend.      PT Treatment/Interventions  ADLs/Self Care Home Management;Electrical Stimulation;Functional mobility training;Therapeutic activities;Manual techniques;Passive range of motion;Ultrasound;Traction;Moist Heat;Therapeutic  exercise;Cryotherapy;Gait training;Patient/family education    PT Next Visit Plan  check HEP, core strength seated , LE strength, Nustep     PT Home Exercise Plan  LTR, hamstring stretch, seated march, PPT, knee to chest , standing march, ball squeeze, side clam, standing ant hip stretch , heel slides, LAQ, quad set        Patient will benefit from skilled therapeutic intervention in order to improve the following deficits and impairments:  Abnormal gait, Decreased endurance, Decreased mobility, Difficulty walking, Obesity, Increased edema, Decreased range of motion, Decreased activity tolerance, Decreased strength, Increased fascial restricitons, Impaired flexibility, Pain, Postural dysfunction  Visit Diagnosis: Acute left-sided low back pain with sciatica, sciatica laterality unspecified  Muscle weakness (generalized)  Difficulty in walking, not elsewhere classified  Pain in left leg     Problem List Patient Active Problem List   Diagnosis Date Noted  . Low back pain without sciatica 01/15/2018  . Cough 06/30/2017  . Weakness 04/13/2017  . Enteritis   . Other ascites   . Urinary tract infection without hematuria   . Left upper arm pain 10/14/2016  . Prediabetes 02/27/2016  . Plantar fasciitis of right foot 02/27/2014  . [place] arthritis 06/15/2013  . Pain of skin 02/18/2013  . Paresthesia of right leg 09/30/2012  . Venous stasis of lower extremity   . Preventative health care 03/05/2012  . Primary cancer of upper inner quadrant of left female breast ([place]) 01/20/2012  . 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 Breast cancer ([place]) 01/06/2012  . Leg swelling 04/09/2011  . 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 STASIS DERMATITIS, CHRONIC 10/25/2009  . DEEP VENOUS THROMBOPHLEBITIS, HX OF 07/28/2008  . OBESITY 01/27/2008  . HYPERTENSION, BENIGN ESSENTIAL 09/24/2006  . ASTHMA, PERSISTENT 09/24/2006  . DEGENERATIVE JOINT DISEASE, KNEE 09/04/2006    PAA,JENNIFER 02/22/2018,  10:13 AM  [facility] [address] [place], [place], [address] Phone: [phone]   Fax:  [phone]  Name: KELCIE CURRIE MRN:  [id number] Date of Birth: 1941-01-12  Raeford Razor, PT 02/22/18 10:13 AM Phone: [phone] Fax: [phone]

## 2018-02-23 ENCOUNTER — Telehealth: Payer: Self-pay

## 2018-02-23 NOTE — Telephone Encounter (Signed)
Pt called nurse line stating someone called her and left a voice a recording to schedule "some type of apt." I am assuming this was for her AWV. Pt stated she would call back to schedule once she is done with her physical therapy. Pt also stated she thinks she is due to come back and she [place]. Will schedule this apt as well soon.

## 2018-02-24 ENCOUNTER — Ambulatory Visit: Payer: Medicare Other | Admitting: Physical Therapy

## 2018-02-24 ENCOUNTER — Encounter: Payer: Self-pay | Admitting: Physical Therapy

## 2018-02-24 DIAGNOSIS — M6281 Muscle weakness (generalized): Secondary | ICD-10-CM | POA: Diagnosis not present

## 2018-02-24 DIAGNOSIS — R262 Difficulty in walking, not elsewhere classified: Secondary | ICD-10-CM | POA: Diagnosis not present

## 2018-02-24 DIAGNOSIS — M5440 Lumbago with sciatica, unspecified side: Secondary | ICD-10-CM | POA: Diagnosis not present

## 2018-02-24 DIAGNOSIS — M79605 Pain in left leg: Secondary | ICD-10-CM

## 2018-02-24 NOTE — Therapy (Signed)
[place], [place], [address] Phone: [phone]   Fax:  [phone]  Physical Therapy Treatment  Patient Details  Name: Erin Esparza MRN: [id number] Date of Birth: 1940-07-05 Referring Provider: Dr. Wendy Poet   Encounter Date: 02/24/2018  PT End of Session - 02/24/18 1240    Visit Number  6    Number of Visits  12    Date for PT Re-Evaluation  03/19/18    PT Start Time  1324    PT Stop Time  1110    PT Time Calculation (min)  55 min    Activity Tolerance  Patient tolerated treatment well    Behavior During Therapy  [facility] for tasks assessed/performed       Past Medical History:  Diagnosis Date  . Abdominal pain 10/17/2016  . Arthritis    "both feet" (10/17/2016)  . Asthma   . Breast cancer ([place]) 01/06/12   left breast invasive ductal ca,dcis,ER/PR=+,  . Breast mass in female   . Clotting disorder ([place])   . Diastolic congestive heart failure ([place])   . DVT (deep venous thrombosis) ([place]) 1990's?   LLE  . Hypertension   . Insulin resistance   . Pulmonary embolism ([place]) ~ 2012  . Radiation 02/12/12 -03/12/12   left breast, total 50gy  . Right ankle pain   . Shortness of breath   . Venous stasis of lower extremity   . Wears dentures    upper  . Wears glasses   . Wears partial dentures    lower    Past Surgical History:  Procedure Laterality Date  . BREAST BIOPSY Left 11/03/11   10 o'clock=benign breast parenchyma  . BREAST LUMPECTOMY Left 01/06/12    Dr. Marcello Moores Cornett    There were no vitals filed for this visit.  Subjective Assessment - 02/24/18 1023    Subjective  A little pain in the top of my thigh.  3/10.            [facility] Adult PT Treatment/Exercise - 02/24/18 0001      Lumbar Exercises: Stretches   Lower Trunk Rotation  10 seconds    Lower Trunk Rotation Limitations  x10       Lumbar Exercises: Aerobic   Nustep  L5 UE and LE for 6 min       Lumbar Exercises: Supine   Pelvic Tilt  10 reps    Pelvic Tilt Limitations  post tilt     Bent Knee Raise  10 reps    Bent Knee Raise Limitations  core march       Knee/Hip Exercises: Stretches   Hip Flexor Stretch  Right;Left;2 reps    Hip Flexor Stretch Limitations  standing       Knee/Hip Exercises: Standing   Heel Raises  Both;1 set;20 reps    Hip Abduction  Stengthening;Both;1 set;15 reps    Hip Extension  Stengthening;Both;1 set;15 reps    Wall Squat  2 sets;10 reps      Knee/Hip Exercises: Seated   Sit to Sand  --   needs UE support      Knee/Hip Exercises: Supine   Quad Sets  Strengthening;Both;1 set;15 reps    Bridges  Strengthening;Both;1 set;10 reps    Bridges Limitations  small ROM     Other Supine Knee/Hip Exercises  glute sets x 10       Knee/Hip Exercises: Sidelying   Hip ABduction  Strengthening;Left;2 sets;10 reps  Clams  x 20              PT Education - 02/24/18 1240    Education Details  hip strength , abduction , effect on gait     Person(s) Educated  Patient    Methods  Explanation    Comprehension  Verbalized understanding          PT Long Term Goals - 02/22/18 1006      PT LONG TERM GOAL #1   Title  Pt will be I with HEP for back, LE strength     Status  On-going      PT LONG TERM GOAL #2   Title  Pt will be able to L lift leg independently to for bed, car transfers    Status  On-going      PT LONG TERM GOAL #3   Title  Pt will be able to stand to wash dishes with pain <3/10 for 10 min     Status  On-going      PT LONG TERM GOAL #4   Title  Pt will be able to walk to and from the bathroom in her home without increased leg pain     Status  On-going            Plan - 02/24/18 1241    Clinical Impression Statement  Patient tolerates exercise well, had some mild increase in pain with standing exercises.  Given rest breaks.  Hip abduction 3-/5 in sidelying.     PT Treatment/Interventions  ADLs/Self Care Home Management;Electrical  Stimulation;Functional mobility training;Therapeutic activities;Manual techniques;Passive range of motion;Ultrasound;Traction;Moist Heat;Therapeutic exercise;Cryotherapy;Gait training;Patient/family education    PT Next Visit Plan  check HEP, core strength seated , LE strength, Nustep     PT Home Exercise Plan  LTR, hamstring stretch, seated march, PPT, knee to chest , , hip abduction, standing march, ball squeeze, side clam, standing ant hip stretch , heel slides, LAQ, quad set     Consulted and Agree with Plan of Care  Patient       Patient will benefit from skilled therapeutic intervention in order to improve the following deficits and impairments:  Abnormal gait, Decreased endurance, Decreased mobility, Difficulty walking, Obesity, Increased edema, Decreased range of motion, Decreased activity tolerance, Decreased strength, Increased fascial restricitons, Impaired flexibility, Pain, Postural dysfunction  Visit Diagnosis: Acute left-sided low back pain with sciatica, sciatica laterality unspecified  Muscle weakness (generalized)  Difficulty in walking, not elsewhere classified  Pain in left leg     Problem List Patient Active Problem List   Diagnosis Date Noted  . Low back pain without sciatica 01/15/2018  . Cough 06/30/2017  . Weakness 04/13/2017  . Enteritis   . Other ascites   . Urinary tract infection without hematuria   . Left upper arm pain 10/14/2016  . Prediabetes 02/27/2016  . Plantar fasciitis of right foot 02/27/2014  . [place] arthritis 06/15/2013  . Pain of skin 02/18/2013  . Paresthesia of right leg 09/30/2012  . Venous stasis of lower extremity   . Preventative health care 03/05/2012  . Primary cancer of upper inner quadrant of left female breast ([place]) 01/20/2012  . 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 Breast cancer ([place]) 01/06/2012  . Leg swelling 04/09/2011  . 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 STASIS DERMATITIS, CHRONIC 10/25/2009  . DEEP VENOUS  THROMBOPHLEBITIS, HX OF 07/28/2008  . OBESITY 01/27/2008  . HYPERTENSION, BENIGN ESSENTIAL 09/24/2006  . ASTHMA, PERSISTENT 09/24/2006  .  DEGENERATIVE JOINT DISEASE, KNEE 09/04/2006    PAA,JENNIFER 02/24/2018, 12:47 PM  [facility] [address] [place], [place], [address] Phone: [phone]   Fax:  [phone]  Name: Erin Esparza MRN: [id number] Date of Birth: 1941-05-20  Raeford Razor, PT 02/24/18 12:47 PM Phone: [phone] Fax: [phone]

## 2018-02-25 ENCOUNTER — Other Ambulatory Visit: Payer: Self-pay

## 2018-02-26 MED ORDER — ENALAPRIL MALEATE 20 MG PO TABS: 20.0000 mg | ORAL_TABLET | Freq: Two times a day (BID) | ORAL | 2 refills | Status: DC

## 2018-03-01 ENCOUNTER — Ambulatory Visit: Payer: Medicare Other | Admitting: Physical Therapy

## 2018-03-01 ENCOUNTER — Other Ambulatory Visit: Payer: Self-pay | Admitting: Family Medicine

## 2018-03-03 ENCOUNTER — Telehealth: Payer: Self-pay | Admitting: Physical Therapy

## 2018-03-03 ENCOUNTER — Ambulatory Visit: Payer: Medicare Other | Attending: Family Medicine | Admitting: Physical Therapy

## 2018-03-03 DIAGNOSIS — M6281 Muscle weakness (generalized): Secondary | ICD-10-CM | POA: Insufficient documentation

## 2018-03-03 DIAGNOSIS — M5440 Lumbago with sciatica, unspecified side: Secondary | ICD-10-CM | POA: Insufficient documentation

## 2018-03-03 DIAGNOSIS — R262 Difficulty in walking, not elsewhere classified: Secondary | ICD-10-CM | POA: Insufficient documentation

## 2018-03-03 DIAGNOSIS — M79605 Pain in left leg: Secondary | ICD-10-CM | POA: Insufficient documentation

## 2018-03-03 NOTE — Telephone Encounter (Signed)
Spoke to patient regarding missed appointment this morning. She reports she overslept today and does plan to attend her next appointment.

## 2018-03-08 ENCOUNTER — Ambulatory Visit: Payer: Medicare Other | Admitting: Physical Therapy

## 2018-03-08 ENCOUNTER — Encounter: Payer: Self-pay | Admitting: Physical Therapy

## 2018-03-08 DIAGNOSIS — M5440 Lumbago with sciatica, unspecified side: Secondary | ICD-10-CM

## 2018-03-08 DIAGNOSIS — R262 Difficulty in walking, not elsewhere classified: Secondary | ICD-10-CM

## 2018-03-08 DIAGNOSIS — M79605 Pain in left leg: Secondary | ICD-10-CM | POA: Diagnosis not present

## 2018-03-08 DIAGNOSIS — M6281 Muscle weakness (generalized): Secondary | ICD-10-CM

## 2018-03-08 NOTE — Therapy (Signed)
[place], [place], [address] Phone: [phone]   Fax:  [phone]  Physical Therapy Treatment/Discharge  Patient Details  Name: Erin Esparza MRN: [id number] Date of Birth: Nov 24, 1940 Referring Provider (PT): Dr. McDiarmid   Encounter Date: 03/08/2018  PT End of Session - 03/08/18 1100    Visit Number  7    Number of Visits  12    Date for PT Re-Evaluation  03/19/18    PT Start Time  1058    PT Stop Time  1133    PT Time Calculation (min)  35 min    Activity Tolerance  Patient tolerated treatment well    Behavior During Therapy  [facility] for tasks assessed/performed       Past Medical History:  Diagnosis Date  . Abdominal pain 10/17/2016  . Arthritis    "both feet" (10/17/2016)  . Asthma   . Breast cancer ([place]) 01/06/12   left breast invasive ductal ca,dcis,ER/PR=+,  . Breast mass in female   . Clotting disorder ([place])   . Diastolic congestive heart failure ([place])   . DVT (deep venous thrombosis) ([place]) 1990's?   LLE  . Hypertension   . Insulin resistance   . Pulmonary embolism ([place]) ~ 2012  . Radiation 02/12/12 -03/12/12   left breast, total 50gy  . Right ankle pain   . Shortness of breath   . Venous stasis of lower extremity   . Wears dentures    upper  . Wears glasses   . Wears partial dentures    lower    Past Surgical History:  Procedure Laterality Date  . BREAST BIOPSY Left 11/03/11   10 o'clock=benign breast parenchyma  . BREAST LUMPECTOMY Left 01/06/12    Dr. Marcello Moores [place]    There were no vitals filed for this visit.      [facility] PT Assessment - 03/08/18 0001      Sit to Stand   Comments  x 10       AROM   Lumbar Flexion  WFL    Lumbar Extension  50% min pain on L     Lumbar - Right Side Bend  [facility]    Lumbar - Left Side Bend  WFL    Lumbar - Right Rotation  [facility]    Lumbar - Left Rotation  [facility]       Strength   Right Hip Flexion  4/5    Left Hip Flexion  3+/5    Left Hip  ABduction  2+/5    Right Knee Flexion  5/5    Right Knee Extension  4+/5    Left Knee Flexion  5/5    Left Knee Extension  4+/5                   [facility] Adult PT Treatment/Exercise - 03/08/18 0001      Lumbar Exercises: Stretches   Lower Trunk Rotation  10 seconds    Lower Trunk Rotation Limitations  x10     Hip Flexor Stretch  Right;Left;3 reps;20 seconds    Hip Flexor Stretch Limitations  standing at counter top       Lumbar Exercises: Aerobic   Nustep  L5 UE and LE for 6 min       Lumbar Exercises: Seated   Other Seated Lumbar Exercises  seated march x 10 for hip flexion, posture       Lumbar Exercises: Supine   Pelvic Tilt  10 reps  Pelvic Tilt Limitations  post tilt     Bridge with Ball Squeeze  10 reps      Lumbar Exercises: Sidelying   Clam  Both;10 reps    Hip Abduction  Both;10 reps      Knee/Hip Exercises: Standing   Hip Abduction  Stengthening;Both;1 set;15 reps             PT Education - 03/08/18 1116    Education Details  Progress, HEP review, importance of hip strength, Cont with exercise, water     Person(s) Educated  Patient    Methods  Explanation    Comprehension  Verbalized understanding          PT Long Term Goals - 03/08/18 1102      PT LONG TERM GOAL #1   Title  Pt will be I with HEP for back, LE strength     Status  Achieved      PT LONG TERM GOAL #2   Title  Pt will be able to L lift leg independently to for bed, car transfers    Baseline  a bit beter, did not need A when she got on and off the NuStep and mat , does need in the car     Status  Partially Met      PT LONG TERM GOAL #3   Title  Pt will be able to stand to wash dishes with pain <3/10 for 10 min     Status  Achieved      PT LONG TERM GOAL #4   Title  Pt will be able to walk to and from the bathroom in her home without increased leg pain     Baseline  min , occasional    Status  Partially Met            Plan - 03/08/18 1104    Clinical  Impression Statement  Patient with less pain overall, can stand longer and needs less A with lifting her LLE up.  Still very weak in glutes and lateral hip.  Able to sit to stand without UE assisit. She would like to be discharged as she is pleased with her current functional level .     PT Treatment/Interventions  ADLs/Self Care Home Management;Electrical Stimulation;Functional mobility training;Therapeutic activities;Manual techniques;Passive range of motion;Ultrasound;Traction;Moist Heat;Therapeutic exercise;Cryotherapy;Gait training;Patient/family education    PT Next Visit Plan  NA, DC     PT Home Exercise Plan  LTR, hamstring stretch, seated march, PPT, knee to chest , , hip abduction, standing march, ball squeeze, side clam, standing ant hip stretch , heel slides, LAQ, quad set     Consulted and Agree with Plan of Care  Patient       Patient will benefit from skilled therapeutic intervention in order to improve the following deficits and impairments:  Abnormal gait, Decreased endurance, Decreased mobility, Difficulty walking, Obesity, Increased edema, Decreased range of motion, Decreased activity tolerance, Decreased strength, Increased fascial restricitons, Impaired flexibility, Pain, Postural dysfunction  Visit Diagnosis: Acute left-sided low back pain with sciatica, sciatica laterality unspecified  Muscle weakness (generalized)  Difficulty in walking, not elsewhere classified  Pain in left leg     Problem List Patient Active Problem List   Diagnosis Date Noted  . Low back pain without sciatica 01/15/2018  . Cough 06/30/2017  . Weakness 04/13/2017  . Enteritis   . Other ascites   . Urinary tract infection without hematuria   . Left upper arm pain 10/14/2016  .  Prediabetes 02/27/2016  . Plantar fasciitis of right foot 02/27/2014  . [place] arthritis 06/15/2013  . Pain of skin 02/18/2013  . Paresthesia of right leg 09/30/2012  . Venous stasis of lower extremity   .  Preventative health care 03/05/2012  . Primary cancer of upper inner quadrant of left female breast ([place]) 01/20/2012  . 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 Breast cancer ([place]) 01/06/2012  . Leg swelling 04/09/2011  . 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 STASIS DERMATITIS, CHRONIC 10/25/2009  . DEEP VENOUS THROMBOPHLEBITIS, HX OF 07/28/2008  . OBESITY 01/27/2008  . HYPERTENSION, BENIGN ESSENTIAL 09/24/2006  . ASTHMA, PERSISTENT 09/24/2006  . DEGENERATIVE JOINT DISEASE, KNEE 09/04/2006    PAA,JENNIFER 03/08/2018, 11:36 AM  [facility] [address] [place], [place], [address] Phone: [phone]   Fax:  [phone]  Name: Erin Esparza MRN: [id number] Date of Birth: 11-13-1940   PHYSICAL THERAPY DISCHARGE SUMMARY  Visits from Start of Care: 7  Current functional level related to goals / functional outcomes: See above    Remaining deficits: Core and hip weakness, posture    Education / Equipment: HEP, posture  Plan: Patient agrees to discharge.  Patient goals were partially met. Patient is being discharged due to being pleased with the current functional level.  ?????    Raeford Razor, PT 03/08/18 11:38 AM Phone: [phone] Fax: [phone]

## 2018-03-09 DIAGNOSIS — M545 Low back pain: Secondary | ICD-10-CM | POA: Diagnosis not present

## 2018-03-18 ENCOUNTER — Telehealth: Payer: Self-pay | Admitting: Family Medicine

## 2018-03-18 ENCOUNTER — Ambulatory Visit: Payer: Medicare Other

## 2018-03-18 DIAGNOSIS — Z23 Encounter for immunization: Secondary | ICD-10-CM

## 2018-03-18 NOTE — Telephone Encounter (Signed)
Placed in MDs box to be filled out. Deseree Blount, CMA  

## 2018-03-18 NOTE — Telephone Encounter (Signed)
Pt is dropping off her handicap placard to be filled out. Please call when ready jw

## 2018-03-23 NOTE — Telephone Encounter (Signed)
Have filled this form out and leaving it at front desk for pick-up.  Please call patient to inform.

## 2018-03-24 NOTE — Telephone Encounter (Signed)
LMOVM informing pt. Erin Esparza, CMA  

## 2018-04-06 DIAGNOSIS — H04123 Dry eye syndrome of bilateral lacrimal glands: Secondary | ICD-10-CM | POA: Diagnosis not present

## 2018-04-06 DIAGNOSIS — H353131 Nonexudative age-related macular degeneration, bilateral, early dry stage: Secondary | ICD-10-CM | POA: Diagnosis not present

## 2018-04-06 DIAGNOSIS — H40023 Open angle with borderline findings, high risk, bilateral: Secondary | ICD-10-CM | POA: Diagnosis not present

## 2018-04-06 DIAGNOSIS — H5213 Myopia, bilateral: Secondary | ICD-10-CM | POA: Diagnosis not present

## 2018-04-06 DIAGNOSIS — H25013 Cortical age-related cataract, bilateral: Secondary | ICD-10-CM | POA: Diagnosis not present

## 2018-04-06 DIAGNOSIS — H3589 Other specified retinal disorders: Secondary | ICD-10-CM | POA: Diagnosis not present

## 2018-04-21 DIAGNOSIS — H52223 Regular astigmatism, bilateral: Secondary | ICD-10-CM | POA: Diagnosis not present

## 2018-04-21 DIAGNOSIS — H524 Presbyopia: Secondary | ICD-10-CM | POA: Diagnosis not present

## 2018-04-21 DIAGNOSIS — H5203 Hypermetropia, bilateral: Secondary | ICD-10-CM | POA: Diagnosis not present

## 2018-04-22 ENCOUNTER — Other Ambulatory Visit: Payer: Self-pay | Admitting: Family Medicine

## 2018-04-27 ENCOUNTER — Ambulatory Visit (INDEPENDENT_AMBULATORY_CARE_PROVIDER_SITE_OTHER): Payer: Medicare Other

## 2018-04-27 VITALS — BP 124/68 | HR 77 | Temp 98.3°F | Ht 66.0 in | Wt 279.0 lb

## 2018-04-27 DIAGNOSIS — Z0000 Encounter for general adult medical examination without abnormal findings: Secondary | ICD-10-CM

## 2018-04-27 NOTE — Progress Notes (Signed)
Subjective:   Erin Esparza is a 77 y.o. female who presents for Medicare Annual (Subsequent) preventive examination.  Review of Systems:  Physical assessment deferred to PCP.  Cardiac Risk Factors include: advanced age (>77men, >74 women);hypertension;sedentary lifestyle     Objective:    Vitals: BP 124/68   Pulse 77   Temp 98.3 F (36.8 C) (Oral)   Ht 5\' 6"  (1.676 m)   Wt 279 lb (126.6 kg)   SpO2 97%   BMI 45.03 kg/m   Body mass index is 45.03 kg/m.  Advanced Directives 04/27/2018 02/05/2018 01/14/2018 11/10/2017 06/26/2017 04/07/2017 03/03/2017  Does Patient Have a Medical Advance Directive? No No No No No No No  Would patient like information on creating a medical advance directive? No - Patient declined No - Patient declined No - Patient declined No - Patient declined No - Patient declined No - Patient declined No - Patient declined   Tobacco Social History   Tobacco Use  Smoking Status Former Smoker  . Packs/day: 0.30  . Years: 15.00  . Pack years: 4.50  . Types: Cigarettes  Smokeless Tobacco Never Used  Tobacco Comment   "stopped in the 1990s"     Counseling given: Yes Comment: "stopped in the 1990s"   Clinical Intake:  Pre-visit preparation completed: Yes  Pain : No/denies pain Pain Score: 0-No pain    Nutritional Status: BMI > 30  Obese Diabetes: No  What is the last grade level you completed in school?: Some college  Interpreter Needed?: No    Past Medical History:  Diagnosis Date  . Abdominal pain 10/17/2016  . Arthritis    "both feet" (10/17/2016)  . Asthma   . Breast cancer ([place]) 01/06/12   left breast invasive ductal ca,dcis,ER/PR=+,  . Breast mass in female   . Cataract   . Clotting disorder ([place])   . Diastolic congestive heart failure ([place])   . DVT (deep venous thrombosis) ([place]) 1990's?   LLE  . Hypertension   . Insulin resistance   . Pulmonary embolism ([place]) ~ 2012  . Radiation 02/12/12 -03/12/12   left breast, total 50gy  .  Right ankle pain   . Shortness of breath   . Venous stasis of lower extremity   . Wears dentures    upper  . Wears glasses   . Wears partial dentures    lower   Past Surgical History:  Procedure Laterality Date  . BREAST BIOPSY Left 11/03/11   10 o'clock=benign breast parenchyma  . BREAST LUMPECTOMY Left 01/06/12    Dr. Erroll Luna   Family History  Problem Relation Age of Onset  . Heart disease Mother   . Heart attack Mother   . Breast cancer Sister   . Parkinson's disease Brother   . Pulmonary embolism Daughter   . Diabetes Daughter   . Pulmonary embolism Daughter   . Breast cancer Sister   . Cancer Brother        unsure  . Other Brother        MVA  . Other Brother        MVA   Social History   Socioeconomic History  . Marital status: Widowed    Spouse name: Not on file  . Number of children: 5  . Years of education: 62  . Highest education level: Associate degree: occupational, Hotel manager, or vocational program  Occupational History  . Occupation: Retired    Fish farm manager: RETIRED  Social Needs  . Emergency planning/management officer  strain: Not hard at all  . Food insecurity:    Worry: Never true    Inability: Never true  . Transportation needs:    Medical: No    Non-medical: No  Tobacco Use  . Smoking status: Former Smoker    Packs/day: 0.30    Years: 15.00    Pack years: 4.50    Types: Cigarettes  . Smokeless tobacco: Never Used  . Tobacco comment: "stopped in the 1990s"  Substance and Sexual Activity  . Alcohol use: No  . Drug use: No  . Sexual activity: Not Currently  Lifestyle  . Physical activity:    Days per week: 2 days    Minutes per session: 20 min  . Stress: Not at all  Relationships  . Social connections:    Talks on phone: More than three times a week    Gets together: More than three times a week    Attends religious service: More than 4 times per year    Active member of club or organization: No    Attends meetings of clubs or organizations: Never     Relationship status: Widowed  Other Topics Concern  . Not on file  Social History Narrative      Emergency Contact: daughter, Freddie Breech (c) [phone] or Arnette Schaumann (Loletha Grayer) [phone]   Who lives with you: daughter - Arnette Schaumann and granddaughter   Lives in one level house. Has grab bars in bathroom at tub. No throw rugs. Smoke alarms   Any pets: 2 dogs, pit bull and fox terrier   Diet: Pt has a varied diet. Does not eat cheese, eats meat, fruit, and vegetables. Drinks tea, water, occasional soda.   Exercise: Pt regularly does low impact aerobic exercises with TV program, walks, stretches, and occasional water aerobics.   Seatbelts: Pt reports wearing seatbelt when in vehicles.    Hobbies: Bingo       Outpatient Encounter Medications as of 04/27/2018  Medication Sig  . acetaminophen (TYLENOL) 500 MG tablet Take 1 tablet (500 mg total) by mouth every 6 (six) hours as needed. (Patient taking differently: Take 500 mg by mouth every 6 (six) hours as needed for moderate pain. )  . Calcium Carbonate (CALTRATE 600 PO) Take 1 tablet by mouth 2 (two) times daily.   . enalapril (VASOTEC) 20 MG tablet Take 1 tablet (20 mg total) by mouth 2 (two) times daily.  . Fluticasone-Salmeterol (ADVAIR DISKUS) 100-50 MCG/DOSE AEPB INHALE 1 PUFF BY MOUTH TWICE DAILY AS DIRECTED  . furosemide (LASIX) 40 MG tablet TAKE 1 TABLET BY MOUTH DAILY AS NEEDED FOR LEG SWELLING  . hydrochlorothiazide (HYDRODIURIL) 25 MG tablet Take 1 tablet (25 mg total) by mouth daily.  . Multiple Vitamin (MULTIVITAMIN) tablet Take 1 tablet by mouth daily.  . Multiple Vitamins-Minerals (ICAPS AREDS 2 PO) Take 1 capsule by mouth 2 (two) times daily.  . Multiple Vitamins-Minerals (WOMENS BONE HEALTH PO) Take 1 tablet by mouth daily.  . rivaroxaban (XARELTO) 20 MG TABS tablet Take 1 tablet (20 mg total) by mouth daily.  Marland Kitchen senna (SENOKOT) 8.6 MG TABS tablet Take 1 tablet (8.6 mg total) by mouth daily.  . Vitamin D, Cholecalciferol, 1000 units CAPS  Take by mouth daily.  . benzonatate (TESSALON) 100 MG capsule Take 1 capsule (100 mg total) by mouth 2 (two) times daily as needed for cough. (Patient not taking: Reported on 04/27/2018)  . predniSONE (DELTASONE) 10 MG tablet Take 3 tablets (30 mg total) by mouth daily with breakfast. (Patient  not taking: Reported on 04/27/2018)  . traMADol (ULTRAM) 50 MG tablet Take 1-2 tablets (50-100 mg total) by mouth every 8 (eight) hours as needed. (Patient not taking: Reported on 04/27/2018)  . XARELTO 20 MG TABS tablet TAKE 1 TABLET BY MOUTH DAILY (Patient not taking: Reported on 04/27/2018)   No facility-administered encounter medications on file as of 04/27/2018.     Activities of Daily Living In your present state of health, do you have any difficulty performing the following activities: 04/27/2018  Hearing? N  Vision? N  Comment Has cataracts but Dr Carolynn Sayers does not think needs to come out yet.  Difficulty concentrating or making decisions? N  Walking or climbing stairs? N  Dressing or bathing? N  Doing errands, shopping? N  Preparing Food and eating ? N  Using the Toilet? N  In the past six months, have you accidently leaked urine? N  Do you have problems with loss of bowel control? N  Managing your Medications? N  Managing your Finances? N  Housekeeping or managing your Housekeeping? N  Some recent data might be hidden    Patient Care Team: Lovenia Kim, MD as PCP - General Marcy Panning, MD (Oncology) [name], MD as Consulting Physician (Ophthalmology) Regal, Tamala Fothergill, DPM as Consulting Physician (Podiatry) [name], [name], MD as Referring Physician (Optometry) [name], MD as Consulting Physician (Orthopedic Surgery)    Assessment:   This is a routine wellness examination for Dally.  Exercise Activities and Dietary recommendations Current Exercise Habits: Home exercise routine, Type of exercise: stretching;walking, Time (Minutes): 15, Frequency (Times/Week): 3,  Weekly Exercise (Minutes/Week): 45, Intensity: Mild, Exercise limited by: orthopedic condition(s)  Goals    . Blood Pressure < 140/90    . Exercise 3x per week (30 min per time)    . Reduce sodium intake       Fall Risk Fall Risk  04/27/2018 01/14/2018 11/10/2017 08/05/2017 03/03/2017  Falls in the past year? 0 No No No No   Is the patient's home free of loose throw rugs in walkways, pet beds, electrical cords, etc?   yes      Grab bars in the bathroom? yes      Handrails on the stairs?   no      Adequate lighting?   yes   Depression Screen PHQ 2/9 Scores 04/27/2018 01/14/2018 11/10/2017 08/05/2017  PHQ - 2 Score 0 0 0 0     Cognitive Function MMSE - Mini Mental State Exam 04/27/2018 10/04/2013 07/11/2011  Orientation to time 5 5 5   Orientation to Place 5 5 5   Registration 3 3 3   Attention/ Calculation 5 5 5   Recall 3 2 2   Language- name 2 objects 2 2 2   Language- repeat 1 1 1   Language- follow 3 step command 3 3 3   Language- read & follow direction 1 1 1   Write a sentence 1 1 1   Copy design 1 1 1   Total score 30 29 29      6CIT Screen 04/27/2018  What Year? 0 points  What month? 0 points  What time? 0 points  Count back from 20 0 points  Months in reverse 0 points  Repeat phrase 0 points  Total Score 0    Immunization History  Administered Date(s) Administered  . Influenza Split 03/12/2011  . Influenza Whole 03/25/2007, 03/06/2008, 03/08/2009, 03/19/2010  . Influenza,inj,Quad PF,6+ Mos 02/18/2013, 02/17/2014, 02/16/2015, 02/27/2016, 03/03/2017, 03/18/2018  . Pneumococcal Conjugate-13 02/17/2014  . Pneumococcal Polysaccharide-23 01/06/2006  .  Td 01/06/2006    Screening Tests Health Maintenance  Topic Date Due  . TETANUS/TDAP  04/28/2019 (Originally 01/07/2016)  . INFLUENZA VACCINE  Completed  . DEXA SCAN  Completed  . PNA vac Low Risk Adult  Completed    Cancer Screenings: Lung: Low Dose CT Chest recommended if Age 31-80 years, 30 pack-year currently smoking OR  have quit w/in 15years. Patient does not qualify. Breast:  Up to date on Mammogram? Yes   Up to date of Bone Density/Dexa? No Colorectal: up to date  Additional Screenings: : Hepatitis C Screening: N/A     Plan:  All health maintenance up to date. Scheduled appt with PCP for 05/20/18 for f/u of HTN.   I have personally reviewed and noted the following in the patient's chart:   . Medical and social history . Use of alcohol, tobacco or illicit drugs  . Current medications and supplements . Functional ability and status . Nutritional status . Physical activity . Advanced directives . List of other physicians . Hospitalizations, surgeries, and ER visits in previous 12 months . Vitals . Screenings to include cognitive, depression, and falls . Referrals and appointments  In addition, I have reviewed and discussed with patient certain preventive protocols, quality metrics, and best practice recommendations. A written personalized care plan for preventive services as well as general preventive health recommendations were provided to patient.     Esau Grew, RN  04/27/2018

## 2018-04-27 NOTE — Patient Instructions (Addendum)
Erin Esparza , Thank you for taking time to come for your Medicare Wellness Visit. I appreciate your ongoing commitment to your health goals. Please review the following plan we discussed and let me know if I can assist you in the future.   Please keep your appointment with Dr. Reesa Chew on 05/20/18 at 2:30.   These are the goals we discussed: Goals    . Blood Pressure < 140/90    . Exercise 3x per week (30 min per time)    . Reduce sodium intake       This is a list of the screening recommended for you and due dates:  Health Maintenance  Topic Date Due  . Tetanus Vaccine  04/28/2019*  . Flu Shot  Completed  . DEXA scan (bone density measurement)  Completed  . Pneumonia vaccines  Completed  *Topic was postponed. The date shown is not the original due date.    Fall Prevention in the Home Falls can cause injuries. They can happen to people of all ages. There are many things you can do to make your home safe and to help prevent falls. What can I do on the outside of my home?  Regularly fix the edges of walkways and driveways and fix any cracks.  Remove anything that might make you trip as you walk through a door, such as a raised step or threshold.  Trim any bushes or trees on the path to your home.  Use bright outdoor lighting.  Clear any walking paths of anything that might make someone trip, such as rocks or tools.  Regularly check to see if handrails are loose or broken. Make sure that both sides of any steps have handrails.  Any raised decks and porches should have guardrails on the edges.  Have any leaves, snow, or ice cleared regularly.  Use sand or salt on walking paths during winter.  Clean up any spills in your garage right away. This includes oil or grease spills. What can I do in the bathroom?  Use night lights.  Install grab bars by the toilet and in the tub and shower. Do not use towel bars as grab bars.  Use non-skid mats or decals in the tub or shower.  If  you need to sit down in the shower, use a plastic, non-slip stool.  Keep the floor dry. Clean up any water that spills on the floor as soon as it happens.  Remove soap buildup in the tub or shower regularly.  Attach bath mats securely with double-sided non-slip rug tape.  Do not have throw rugs and other things on the floor that can make you trip. What can I do in the bedroom?  Use night lights.  Make sure that you have a light by your bed that is easy to reach.  Do not use any sheets or blankets that are too big for your bed. They should not hang down onto the floor.  Have a firm chair that has side arms. You can use this for support while you get dressed.  Do not have throw rugs and other things on the floor that can make you trip. What can I do in the kitchen?  Clean up any spills right away.  Avoid walking on wet floors.  Keep items that you use a lot in easy-to-reach places.  If you need to reach something above you, use a strong step stool that has a grab bar.  Keep electrical cords out of the way.  Do not use floor polish or wax that makes floors slippery. If you must use wax, use non-skid floor wax.  Do not have throw rugs and other things on the floor that can make you trip. What can I do with my stairs?  Do not leave any items on the stairs.  Make sure that there are handrails on both sides of the stairs and use them. Fix handrails that are broken or loose. Make sure that handrails are as long as the stairways.  Check any carpeting to make sure that it is firmly attached to the stairs. Fix any carpet that is loose or worn.  Avoid having throw rugs at the top or bottom of the stairs. If you do have throw rugs, attach them to the floor with carpet tape.  Make sure that you have a light switch at the top of the stairs and the bottom of the stairs. If you do not have them, ask someone to add them for you. What else can I do to help prevent falls?  Wear shoes  that: ? Do not have high heels. ? Have rubber bottoms. ? Are comfortable and fit you well. ? Are closed at the toe. Do not wear sandals.  If you use a stepladder: ? Make sure that it is fully opened. Do not climb a closed stepladder. ? Make sure that both sides of the stepladder are locked into place. ? Ask someone to hold it for you, if possible.  Clearly mark and make sure that you can see: ? Any grab bars or handrails. ? First and last steps. ? Where the edge of each step is.  Use tools that help you move around (mobility aids) if they are needed. These include: ? Canes. ? Walkers. ? Scooters. ? Crutches.  Turn on the lights when you go into a dark area. Replace any light bulbs as soon as they burn out.  Set up your furniture so you have a clear path. Avoid moving your furniture around.  If any of your floors are uneven, fix them.  If there are any pets around you, be aware of where they are.  Review your medicines with your doctor. Some medicines can make you feel dizzy. This can increase your chance of falling. Ask your doctor what other things that you can do to help prevent falls. This information is not intended to replace advice given to you by your health care provider. Make sure you discuss any questions you have with your health care provider. Document Released: 03/15/2009 Document Revised: 10/25/2015 Document Reviewed: 06/23/2014 Elsevier Interactive Patient Education  [employer].

## 2018-04-28 NOTE — Progress Notes (Signed)
I have reviewed the encounter visit below by Danley Danker and agree with her documentation.   Lovenia Kim MD  [place] PGY3

## 2018-05-05 ENCOUNTER — Ambulatory Visit (INDEPENDENT_AMBULATORY_CARE_PROVIDER_SITE_OTHER): Payer: Medicare Other | Admitting: Podiatry

## 2018-05-05 ENCOUNTER — Encounter: Payer: Self-pay | Admitting: Podiatry

## 2018-05-05 DIAGNOSIS — M779 Enthesopathy, unspecified: Secondary | ICD-10-CM

## 2018-05-05 MED ORDER — TRIAMCINOLONE ACETONIDE 10 MG/ML IJ SUSP
10.0000 mg | Freq: Once | INTRAMUSCULAR | Status: AC
  Administered 2018-05-05: 10 mg

## 2018-05-05 NOTE — Progress Notes (Signed)
Subjective:   Patient ID: Erin Esparza, female   DOB: 77 y.o.   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  Reappoint when symptoms indicate

## 2018-05-20 ENCOUNTER — Other Ambulatory Visit: Payer: Self-pay

## 2018-05-20 ENCOUNTER — Encounter: Payer: Self-pay | Admitting: Family Medicine

## 2018-05-20 ENCOUNTER — Ambulatory Visit (INDEPENDENT_AMBULATORY_CARE_PROVIDER_SITE_OTHER): Payer: Medicare Other | Admitting: Family Medicine

## 2018-05-20 VITALS — BP 122/58 | HR 92 | Temp 99.0°F | Ht 66.0 in | Wt 271.4 lb

## 2018-05-20 DIAGNOSIS — R310 Gross hematuria: Secondary | ICD-10-CM | POA: Diagnosis not present

## 2018-05-20 DIAGNOSIS — N898 Other specified noninflammatory disorders of vagina: Secondary | ICD-10-CM

## 2018-05-20 DIAGNOSIS — R319 Hematuria, unspecified: Secondary | ICD-10-CM | POA: Insufficient documentation

## 2018-05-20 LAB — POCT URINALYSIS DIP (MANUAL ENTRY)
Bilirubin, UA: NEGATIVE
Glucose, UA: NEGATIVE mg/dL
Ketones, POC UA: NEGATIVE mg/dL
Leukocytes, UA: NEGATIVE
Nitrite, UA: NEGATIVE
Protein Ur, POC: 100 mg/dL — AB
Spec Grav, UA: 1.02 (ref 1.010–1.025)
Urobilinogen, UA: 0.2 E.U./dL
pH, UA: 7 (ref 5.0–8.0)

## 2018-05-20 LAB — POCT UA - MICROSCOPIC ONLY

## 2018-05-20 NOTE — Patient Instructions (Signed)
It was nice seeing you again today.  You were seen in clinic for bleeding from your urinary tract.  We checked your urine today and did not see any evidence of infection however there was a large number of red blood cells.  I am referring you to a urologist for further evaluation and they may want to do a cystoscopy to take a look at your bladder.  You can expect a call within about a week or so regarding scheduling this.  In the meantime, if you have any uncontrolled bleeding or worsening symptoms, please go to the ED.    Lovenia Kim MD

## 2018-05-20 NOTE — Assessment & Plan Note (Signed)
No systemic symptoms and no evidence of urinary tract infection.  POC UA today shows large blood, urine microscopy with large too many to count RBCs.  Discussed with patient that this would require further evaluation with urology in case a cystoscopy is needed.  She is on a blood thinner, has history of tobacco use which may predispose to bladder cancer.  Patient expresses good understanding of this.   - [facility]erral [facility] placed -Return precautions discussed

## 2018-05-20 NOTE — Progress Notes (Signed)
   Subjective:   Patient ID: Erin Esparza    DOB: Oct 23, 1940, 77 y.o. female   MRN: [id number]  CC: blood in urine   HPI: Erin Esparza is a 77 y.o. female who presents to clinic today for the following issue.  Blood in urine Patient reports on Tuesday she had some blood on her tissue when wiping and has since then had intermittent gross hematuria.  She states yesterday she started bleeding as if it was a period.  She denies having any vaginal bleeding ever since she had menopause at age 48.  No prior history of hematuria.  No dysuria or urgency but does report frequency.  She has not been having any lower back pain.  No fevers, chills, nausea, vomiting.  She has been drinking plenty of water and has been staying well-hydrated.  She has also been drinking some cranberry juice.  Bleeding in urine appears as clots.  She reports a history of tobacco use and quit 20 years ago.  She used to smoke for about 10 years.  Has never been seen by a urologist.  No abdominal or pelvic pain noted.  Has not noticed any weight loss and has been drinking and eating okay.  ROS: See HPI for pertinent ROS.  Social: Patient is a former smoker. Medications reviewed. Objective:   BP (!) 122/58   Pulse 92   Temp 99 F (37.2 C) (Oral)   Ht 5\' 6"  (1.676 m)   Wt 271 lb 6 oz (123.1 kg)   SpO2 99%   BMI 43.80 kg/m  Vitals and nursing note reviewed.  General: 77 year old female, NAD HEENT: NCAT NCAT, PERRLA, no conjunctival pallor Neck: supple CV: RRR no MRG  Lungs: CTAB, normal effort  Abdomen: soft, NTND, no pelvic or suprapubic tenderness, no CVA tenderness, +bs  Skin: warm, dry, no rash  Extremities: No edema  Assessment & Plan:   Hematuria No systemic symptoms and no evidence of urinary tract infection.  POC UA today shows large blood, urine microscopy with large too many to count RBCs.  Discussed with patient that this would require further evaluation with urology in case a cystoscopy is needed.  She  is on a blood thinner, has history of tobacco use which may predispose to bladder cancer.  Patient expresses good understanding of this.   - [facility] -Return precautions discussed  Orders Placed This Encounter  Procedures  . Ambulatory referral to Urology    Referral Priority:   Routine    Referral Type:   Consultation    Referral Reason:   Specialty Services Required    Requested Specialty:   Urology    Number of Visits Requested:   1  . POCT urinalysis dipstick  . POCT UA - Microscopic Only   Lovenia Kim, MD [place]

## 2018-05-20 NOTE — Addendum Note (Signed)
Addended by: Owens Shark, CARINA on: 05/20/2018 04:30 PM   Modules accepted: Level of Service

## 2018-06-17 ENCOUNTER — Ambulatory Visit (INDEPENDENT_AMBULATORY_CARE_PROVIDER_SITE_OTHER): Payer: Medicare Other | Admitting: Podiatry

## 2018-06-17 ENCOUNTER — Ambulatory Visit (INDEPENDENT_AMBULATORY_CARE_PROVIDER_SITE_OTHER): Payer: Medicare Other

## 2018-06-17 ENCOUNTER — Encounter: Payer: Self-pay | Admitting: Podiatry

## 2018-06-17 DIAGNOSIS — M109 Gout, unspecified: Secondary | ICD-10-CM | POA: Diagnosis not present

## 2018-06-17 DIAGNOSIS — M7751 Other enthesopathy of right foot: Secondary | ICD-10-CM

## 2018-06-17 DIAGNOSIS — M79674 Pain in right toe(s): Secondary | ICD-10-CM

## 2018-06-17 DIAGNOSIS — M779 Enthesopathy, unspecified: Secondary | ICD-10-CM

## 2018-06-17 MED ORDER — TRIAMCINOLONE ACETONIDE 10 MG/ML IJ SUSP
10.0000 mg | Freq: Once | INTRAMUSCULAR | Status: AC
  Administered 2018-06-17: 10 mg

## 2018-06-17 NOTE — Patient Instructions (Signed)

## 2018-06-21 ENCOUNTER — Other Ambulatory Visit: Payer: Self-pay | Admitting: Podiatry

## 2018-06-21 DIAGNOSIS — M7751 Other enthesopathy of right foot: Secondary | ICD-10-CM

## 2018-06-22 NOTE — Progress Notes (Signed)
Subjective:   Patient ID: Erin Esparza, female   DOB: 78 y.o.   MRN: [id number]   HPI Erin Esparza presents with irritation of her right big toe and some swelling about her left big toe and concerned about the possibility for gout and not well educated on gout   ROS      Objective:  Physical Exam  Neurovascular status intact with patient found to have swelling of the right hallux localized to the inner phalangeal joint and has some swelling around the left first MPJ big toe joint that is mildly tender     Assessment:  Possibility for inflammatory capsulitis of the inner phalangeal joint right with also possibility for gout-like symptomatology     Plan:  H&P conditions reviewed and today I did a proximal nerve block of right I did a sterile prep and I injected the inner phalangeal joint with 2 mg Kenalog 5 mg Xylocaine and advised on reduced activity.  I also gave her all instructions on gout and spent a great deal time educating her on gout and its relationship to her problems  X-rays were negative for signs of fracture or advanced arthritis with mild ostial lysis osteoporosis

## 2018-07-04 ENCOUNTER — Other Ambulatory Visit: Payer: Self-pay | Admitting: Family Medicine

## 2018-07-08 DIAGNOSIS — R3121 Asymptomatic microscopic hematuria: Secondary | ICD-10-CM | POA: Diagnosis not present

## 2018-07-21 DIAGNOSIS — R3129 Other microscopic hematuria: Secondary | ICD-10-CM | POA: Diagnosis not present

## 2018-07-21 DIAGNOSIS — R3121 Asymptomatic microscopic hematuria: Secondary | ICD-10-CM | POA: Diagnosis not present

## 2018-07-22 ENCOUNTER — Other Ambulatory Visit: Payer: Self-pay | Admitting: Family Medicine

## 2018-07-22 ENCOUNTER — Telehealth: Payer: Self-pay | Admitting: *Deleted

## 2018-07-22 NOTE — Telephone Encounter (Signed)
Called and spoke with the patient, scheduled her to see Dr. Denman George on 2/24

## 2018-07-26 ENCOUNTER — Encounter: Payer: Self-pay | Admitting: Gynecologic Oncology

## 2018-07-26 ENCOUNTER — Inpatient Hospital Stay (HOSPITAL_BASED_OUTPATIENT_CLINIC_OR_DEPARTMENT_OTHER): Payer: Medicare Other | Admitting: Gynecologic Oncology

## 2018-07-26 ENCOUNTER — Encounter: Payer: Self-pay | Admitting: Oncology

## 2018-07-26 ENCOUNTER — Telehealth: Payer: Self-pay

## 2018-07-26 ENCOUNTER — Inpatient Hospital Stay: Payer: Medicare Other | Attending: Gynecologic Oncology

## 2018-07-26 VITALS — BP 134/53 | HR 82 | Temp 98.3°F | Resp 20 | Ht 66.5 in | Wt 272.9 lb

## 2018-07-26 DIAGNOSIS — J45909 Unspecified asthma, uncomplicated: Secondary | ICD-10-CM | POA: Insufficient documentation

## 2018-07-26 DIAGNOSIS — N950 Postmenopausal bleeding: Secondary | ICD-10-CM | POA: Insufficient documentation

## 2018-07-26 DIAGNOSIS — N189 Chronic kidney disease, unspecified: Secondary | ICD-10-CM | POA: Diagnosis not present

## 2018-07-26 DIAGNOSIS — I130 Hypertensive heart and chronic kidney disease with heart failure and stage 1 through stage 4 chronic kidney disease, or unspecified chronic kidney disease: Secondary | ICD-10-CM | POA: Insufficient documentation

## 2018-07-26 DIAGNOSIS — Z87891 Personal history of nicotine dependence: Secondary | ICD-10-CM | POA: Insufficient documentation

## 2018-07-26 DIAGNOSIS — Z853 Personal history of malignant neoplasm of breast: Secondary | ICD-10-CM

## 2018-07-26 DIAGNOSIS — C530 Malignant neoplasm of endocervix: Secondary | ICD-10-CM | POA: Diagnosis not present

## 2018-07-26 DIAGNOSIS — R1900 Intra-abdominal and pelvic swelling, mass and lump, unspecified site: Secondary | ICD-10-CM

## 2018-07-26 DIAGNOSIS — R971 Elevated cancer antigen 125 [CA 125]: Secondary | ICD-10-CM | POA: Diagnosis not present

## 2018-07-26 DIAGNOSIS — Z79899 Other long term (current) drug therapy: Secondary | ICD-10-CM | POA: Insufficient documentation

## 2018-07-26 DIAGNOSIS — C801 Malignant (primary) neoplasm, unspecified: Secondary | ICD-10-CM

## 2018-07-26 DIAGNOSIS — R590 Localized enlarged lymph nodes: Secondary | ICD-10-CM | POA: Insufficient documentation

## 2018-07-26 DIAGNOSIS — C541 Malignant neoplasm of endometrium: Secondary | ICD-10-CM

## 2018-07-26 DIAGNOSIS — E7800 Pure hypercholesterolemia, unspecified: Secondary | ICD-10-CM | POA: Insufficient documentation

## 2018-07-26 DIAGNOSIS — C539 Malignant neoplasm of cervix uteri, unspecified: Secondary | ICD-10-CM | POA: Diagnosis not present

## 2018-07-26 LAB — CBC WITH DIFFERENTIAL (CANCER CENTER ONLY)
Abs Immature Granulocytes: 0.02 10*3/uL (ref 0.00–0.07)
Basophils Absolute: 0.1 10*3/uL (ref 0.0–0.1)
Basophils Relative: 1 %
Eosinophils Absolute: 0.1 10*3/uL (ref 0.0–0.5)
Eosinophils Relative: 2 %
HCT: 33.1 % — ABNORMAL LOW (ref 36.0–46.0)
Hemoglobin: 10.4 g/dL — ABNORMAL LOW (ref 12.0–15.0)
Immature Granulocytes: 0 %
Lymphocytes Relative: 12 %
Lymphs Abs: 1 10*3/uL (ref 0.7–4.0)
MCH: 30 pg (ref 26.0–34.0)
MCHC: 31.4 g/dL (ref 30.0–36.0)
MCV: 95.4 fL (ref 80.0–100.0)
Monocytes Absolute: 0.7 10*3/uL (ref 0.1–1.0)
Monocytes Relative: 8 %
Neutro Abs: 6.5 10*3/uL (ref 1.7–7.7)
Neutrophils Relative %: 77 %
Platelet Count: 283 10*3/uL (ref 150–400)
RBC: 3.47 MIL/uL — ABNORMAL LOW (ref 3.87–5.11)
RDW: 13.2 % (ref 11.5–15.5)
WBC Count: 8.3 10*3/uL (ref 4.0–10.5)
nRBC: 0 % (ref 0.0–0.2)

## 2018-07-26 LAB — COMPREHENSIVE METABOLIC PANEL
ALT: 8 U/L (ref 0–44)
AST: 20 U/L (ref 15–41)
Albumin: 3.5 g/dL (ref 3.5–5.0)
Alkaline Phosphatase: 84 U/L (ref 38–126)
Anion gap: 11 (ref 5–15)
BUN: 19 mg/dL (ref 8–23)
CO2: 25 mmol/L (ref 22–32)
Calcium: 9.8 mg/dL (ref 8.9–10.3)
Chloride: 103 mmol/L (ref 98–111)
Creatinine, Ser: 1.15 mg/dL — ABNORMAL HIGH (ref 0.44–1.00)
GFR calc Af Amer: 52 mL/min — ABNORMAL LOW (ref >60)
GFR calc non Af Amer: 45 mL/min — ABNORMAL LOW (ref >60)
Glucose, Bld: 96 mg/dL (ref 70–99)
Potassium: 4 mmol/L (ref 3.5–5.1)
Sodium: 139 mmol/L (ref 135–145)
Total Bilirubin: 0.3 mg/dL (ref 0.3–1.2)
Total Protein: 8.5 g/dL — ABNORMAL HIGH (ref 6.5–8.1)

## 2018-07-26 MED ORDER — ENOXAPARIN SODIUM 120 MG/0.8ML ~~LOC~~ SOLN: 120.0000 mg | Freq: Two times a day (BID) | SUBCUTANEOUS | 0 refills | Status: DC

## 2018-07-26 NOTE — Telephone Encounter (Signed)
Told Erin Esparza that her kidney function from today was good.  She can pick up and begin this evening the Lovenox injections of 120 mg as directed. Pt verbalized understanding. Pt to call the office if there is a difficulty obtaining  the medication to begin this evening. Pt verbalized understanding.

## 2018-07-26 NOTE — Patient Instructions (Signed)
Plan to have lab work drawn today.  We will contact you with the results.  We will also contact you with the results of your biopsies from today.  If the biopsies confirm cancer, then we can cancel the surgery scheduled for Thursday of this week and we will proceed with consultation with Dr. [name].    If the biopsies are negative or do not show cancer, we will need to proceed with surgery on Thursday, Feb 27.  See below for instructions.  We will also be arranging for you to have a CT scan of your chest, a referral to meet with the genetics counselor.    WE WILL NEED YOU TO TAKE NO MORE XARELTO AT THIS TIME IN PREPARATION FOR POSSIBLE SURGERY ON Thursday.  YOU WILL NEED TO START LOVENOX INJECTIONS (120 mg) TWICE A DAY STARTING TONIGHT WITH YOUR LAST DOSE ON Wednesday EVENING.  Preparing for your Surgery  Plan for surgery on July 29, 2018 with Dr. Everitt Amber at [facility] will be scheduled for a diagnostic laparoscopy, peritoneal biopsies, dilation and curettage of the uterus.   Pre-operative Testing -You will receive a phone call from presurgical testing at [facility] if you have not received a call already to arrange for a pre-operative testing appointment before your surgery.  This appointment normally occurs one to two weeks before your scheduled surgery.   -Bring your insurance card, copy of an advanced directive if applicable, medication list  -At that visit, you will be asked to sign a consent for a possible blood transfusion in case a transfusion becomes necessary during surgery.  The need for a blood transfusion is rare but having consent is a necessary part of your care.     Day Before Surgery at [facility] will be asked to take in a light diet the day before surgery.  Avoid carbonated beverages.  You will be advised to have nothing to eat or drink after midnight the evening before.    Eat a light diet the day before surgery.  Examples including soups,  broths, toast, yogurt, mashed potatoes.  Things to avoid include carbonated beverages (fizzy beverages), raw fruits and raw vegetables, or beans.   If your bowels are filled with gas, your surgeon will have difficulty visualizing your pelvic organs which increases your surgical risks.  Your role in recovery Your role is to become active as soon as directed by your doctor, while still giving yourself time to heal.  Rest when you feel tired. You will be asked to do the following in order to speed your recovery:  - Cough and breathe deeply. This helps toclear and expand your lungs and can prevent pneumonia. You may be given a spirometer to practice deep breathing. A staff member will show you how to use the spirometer. - Do mild physical activity. Walking or moving your legs help your circulation and body functions return to normal. A staff member will help you when you try to walk and will provide you with simple exercises. Do not try to get up or walk alone the first time. - Actively manage your pain. Managing your pain lets you move in comfort. We will ask you to rate your pain on a scale of zero to 10. It is your responsibility to tell your doctor or nurse where and how much you hurt so your pain can be treated.  -Dr. Lahoma Crocker is the Surgeon that assists your GYN Oncologist with surgery.   -  FMLA forms can be faxed to [phone] and please allow 5-7 business days for completion.  Blood Transfusion Information WHAT IS A BLOOD TRANSFUSION? A transfusion is the replacement of blood or some of its parts. Blood is made up of multiple cells which provide different functions.  Red blood cells carry oxygen and are used for blood loss replacement.  White blood cells fight against infection.  Platelets control bleeding.  Plasma helps clot blood.  Other blood products are available for specialized needs, such as hemophilia or other clotting disorders. BEFORE THE TRANSFUSION  Who gives  blood for transfusions?   You may be able to donate blood to be used at a later date on yourself (autologous donation).  Relatives can be asked to donate blood. This is generally not any safer than if you have received blood from a stranger. The same precautions are taken to ensure safety when a relative's blood is donated.  Healthy volunteers who are fully evaluated to make sure their blood is safe. This is blood bank blood. Transfusion therapy is the safest it has ever been in the practice of medicine. Before blood is taken from a donor, a complete history is taken to make sure that person has no history of diseases nor engages in risky social behavior (examples are intravenous drug use or sexual activity with multiple partners). The donor's travel history is screened to minimize risk of transmitting infections, such as malaria. The donated blood is tested for signs of infectious diseases, such as HIV and hepatitis. The blood is then tested to be sure it is compatible with you in order to minimize the chance of a transfusion reaction. If you or a relative donates blood, this is often done in anticipation of surgery and is not appropriate for emergency situations. It takes many days to process the donated blood. RISKS AND COMPLICATIONS Although transfusion therapy is very safe and saves many lives, the main dangers of transfusion include:   Getting an infectious disease.  Developing a transfusion reaction. This is an allergic reaction to something in the blood you were given. Every precaution is taken to prevent this. The decision to have a blood transfusion has been considered carefully by your caregiver before blood is given. Blood is not given unless the benefits outweigh the risks.

## 2018-07-26 NOTE — Progress Notes (Signed)
Consult Note: Gyn-Onc  Consult was requested by Dr. Gloriann Loan for the evaluation of Erin Esparza 78 y.o. female  CC:  Chief Complaint  Patient presents with  . Pelvic mass    Assessment/Plan:  Erin Esparza  is a 78 y.o.  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  I favor uterine primary based on my exam today.  I reviewed the images from her CT scan from 07/21/18. She has bulky retroperitoneal adenopathy including a 2.3cm left para-aortic node at the level of the renal vessels. Given her body habitus and comorbidities, I do not feel that this will be able to be debulked with acceptable morbidity.  We will follow-up the results of today's biopsies from the cervix and uterus. The cervix is palpably replaced by tumor. If these biopsies are malignant consistent with primary endometrial cancer, we will proceed with 6 cycles of carboplatin and paclitaxel chemotherapy followed by hysterectomy/BSO after 6, and possible radiation to the pelvis.  If these biopsies are non-diagnostic we will proceed with a D&C and diagnostic laparoscopy with peritoneal/ovarian biopsies to establish tissue diagnosis. This is risky in this patient given her anticoagulation. I have instructed her to hold xarelto now until possible procedure Thursday. She will take 139m BID lovenox as a bridge until the night before surgery (last dose). We will continue with lovenox for 1 week postop prior to transitioning back to Xarelto if there is no postop bleeding. I discussed operative risks including  bleeding, infection, damage to internal organs (such as bladder,ureters, bowels), blood clot, reoperation and rehospitalization. I discussed that these risks are higher for [name] due to her medical comorbidities specifically obesity, anticoagulants and history of VTE events.   Due to her history of two primary malignancies and  her strong family history, we will send [facility] to discuss testing.   HPI: Erin AAnnora Guderianis a 78year old P5 who is seen in consultation at the request of Dr. BGloriann Loanfor pelvic and retroperitoneal masses.  The patient reports vaginal bleeding in December 2019.  She thought that maybe it was coming from her urine but then began bleeding more regularly like a period.  She received consultation by Dr. BGloriann Loanwho performed CT scan of the abdomen and pelvis on fibroid 19 2020.  This revealed a stable left lower lobe minimal subpleural nodularity.  Mild cardiomegaly with multivessel coronary artery atherosclerosis.  A new hypoattenuating right hepatic lobe 2 cm lesion with circumferential enhancement.  A 2.2 cm left renal cyst.  The right side of the urinary bladder enters an inguinal hernia.  There is extensive retroperitoneal adenopathy with left periaortic lymph node measuring 2.2 cm at the level of the renal vein.  On a previous CT scan in 2018 it measured 9 mm.  In the pelvis a right common iliac node measures 2 cm with it being 9 mm on the prior scan and the left external iliac node measures 1.7 cm increased from 1.3 on prior scan.  There are heterogeneous solid and cystic masses within the pelvis greater on the left side.  On the left it is position posteriorly measures 8.2 x 6.7 cm.  The uterus is difficult to evaluate secondary to diffuse pelvic abnormality.  The endometrium appears thickened on the sagittal image at 1.7 cm.  Is a new omental nodule measuring 1 cm.  The patient is morbidly obese.  She  has a history of multiple DVTs and pulmonary emboli.  She does not have a known genetic predisposition to thrombi.  She has personal history of breast cancer treated in 2013 with surgery and local radiation.  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  She has had no other surgeries.  She has had 5 vaginal deliveries.  She carries a diagnosis of diagnostic congestive  heart failure, however she feels that this is secondary to her PE and not a discrete problem.  She is chronic kidney disease with her baseline creatinine of 1.1.  She has hypertension and hypercholesterolemia and asthma.  Her family history is remarkable for two sistesr with breast cancer, a brother with prostate cancer.  Current Meds:  Outpatient Encounter Medications as of 07/26/2018  Medication Sig  . acetaminophen (TYLENOL) 500 MG tablet Take 1 tablet (500 mg total) by mouth every 6 (six) hours as needed. (Patient taking differently: Take 500 mg by mouth every 6 (six) hours as needed for moderate pain. )  . Calcium Carbonate (CALTRATE 600 PO) Take 1 tablet by mouth 2 (two) times daily.   . enalapril (VASOTEC) 20 MG tablet Take 1 tablet (20 mg total) by mouth 2 (two) times daily.  . furosemide (LASIX) 40 MG tablet TAKE 1 TABLET BY MOUTH DAILY AS NEEDED FOR LEG SWELLING  . hydrochlorothiazide (HYDRODIURIL) 25 MG tablet Take 1 tablet (25 mg total) by mouth daily.  . Multiple Vitamin (MULTIVITAMIN) tablet Take 1 tablet by mouth daily.  . Multiple Vitamins-Minerals (ICAPS AREDS 2 PO) Take 1 capsule by mouth 2 (two) times daily.  . Multiple Vitamins-Minerals (WOMENS BONE HEALTH PO) Take 1 tablet by mouth daily.  . rivaroxaban (XARELTO) 20 MG TABS tablet Take 1 tablet (20 mg total) by mouth daily.  Marland Kitchen senna (SENOKOT) 8.6 MG TABS tablet Take 1 tablet (8.6 mg total) by mouth daily.  . Vitamin D, Cholecalciferol, 1000 units CAPS Take by mouth daily.  Grant Ruts INHUB 100-50 MCG/DOSE AEPB INHALE 1 PUFF BY MOUTH TWICE DAILY AS DIRECTED (Patient taking differently: Inhale 1 puff into the lungs daily. )  . enoxaparin (LOVENOX) 120 MG/0.8ML injection Inject 0.8 mLs (120 mg total) into the skin every 12 (twelve) hours.  . [DISCONTINUED] benzonatate (TESSALON) 100 MG capsule Take 1 capsule (100 mg total) by mouth 2 (two) times daily as needed for cough. (Patient not taking: Reported on 07/26/2018)  .  [DISCONTINUED] predniSONE (DELTASONE) 10 MG tablet Take 3 tablets (30 mg total) by mouth daily with breakfast. (Patient not taking: Reported on 07/26/2018)  . [DISCONTINUED] traMADol (ULTRAM) 50 MG tablet Take 1-2 tablets (50-100 mg total) by mouth every 8 (eight) hours as needed. (Patient not taking: Reported on 07/26/2018)  . [DISCONTINUED] XARELTO 20 MG TABS tablet TAKE 1 TABLET BY MOUTH DAILY   No facility-administered encounter medications on file as of 07/26/2018.     Allergy: No Known Allergies  Social Hx:   Social History   Socioeconomic History  . Marital status: Widowed    Spouse name: Not on file  . Number of children: 5  . Years of education: 57  . Highest education level: Associate degree: occupational, Hotel manager, or vocational program  Occupational History  . Occupation: Retired    Fish farm manager: RETIRED  Social Needs  . Financial resource strain: Not hard at all  . Food insecurity:    Worry: Never true    Inability: Never true  . Transportation needs:    Medical: No  Non-medical: No  Tobacco Use  . Smoking status: Former Smoker    Packs/day: 0.30    Years: 15.00    Pack years: 4.50    Types: Cigarettes  . Smokeless tobacco: Never Used  . Tobacco comment: "stopped in the 1990s"  Substance and Sexual Activity  . Alcohol use: No  . Drug use: No  . Sexual activity: Not Currently  Lifestyle  . Physical activity:    Days per week: 2 days    Minutes per session: 20 min  . Stress: Not at all  Relationships  . Social connections:    Talks on phone: More than three times a week    Gets together: More than three times a week    Attends religious service: More than 4 times per year    Active member of club or organization: No    Attends meetings of clubs or organizations: Never    Relationship status: Widowed  . Intimate partner violence:    Fear of current or ex partner: No    Emotionally abused: No    Physically abused: No    Forced sexual activity: No  Other  Topics Concern  . Not on file  Social History Narrative      Emergency Contact: daughter, Freddie Breech (c) [phone] or Arnette Schaumann (Loletha Grayer) [phone]   Who lives with you: daughter - Arnette Schaumann and granddaughter   Lives in one level house. Has grab bars in bathroom at tub. No throw rugs. Smoke alarms   Any pets: 2 dogs, pit bull and fox terrier   Diet: Pt has a varied diet. Does not eat cheese, eats meat, fruit, and vegetables. Drinks tea, water, occasional soda.   Exercise: Pt regularly does low impact aerobic exercises with TV program, walks, stretches, and occasional water aerobics.   Seatbelts: Pt reports wearing seatbelt when in vehicles.    Hobbies: Bingo       Past Surgical Hx:  Past Surgical History:  Procedure Laterality Date  . BREAST BIOPSY Left 11/03/11   10 o'clock=benign breast parenchyma  . BREAST LUMPECTOMY Left 01/06/12    Dr. Erroll Luna    Past Medical Hx:  Past Medical History:  Diagnosis Date  . Abdominal pain 10/17/2016  . Arthritis    "both feet" (10/17/2016)  . Asthma   . Breast cancer ([place]) 01/06/12   left breast invasive ductal ca,dcis,ER/PR=+,  . Breast mass in female   . Cataract   . Clotting disorder ([place])   . Diastolic congestive heart failure ([place])   . DVT (deep venous thrombosis) ([place]) 1990's?   LLE  . Hypertension   . Insulin resistance   . Pulmonary embolism ([place]) ~ 2012  . Radiation 02/12/12 -03/12/12   left breast, total 50gy  . Right ankle pain   . Shortness of breath   . Venous stasis of lower extremity   . Wears dentures    upper  . Wears glasses   . Wears partial dentures    lower    Past Gynecological History:  See HPI No LMP recorded. Patient is postmenopausal. Last prior gyn exam at age 1.   Family Hx:  Family History  Problem Relation Age of Onset  . Heart disease Mother   . Heart attack Mother   . Breast cancer Sister   . Parkinson's disease Brother   . Pulmonary embolism Daughter   . Diabetes Daughter   . Pulmonary  embolism Daughter   . Breast cancer Sister   . Cancer Brother  unsure  . Other Brother        MVA  . Other Brother        MVA  . COPD Brother     Review of Systems:  Constitutional  Feels well,   ENT Normal appearing ears and nares bilaterally Skin/Breast  No rash, sores, jaundice, itching, dryness Cardiovascular  No chest pain, shortness of breath, or edema  Pulmonary  No cough or wheeze.  Gastro Intestinal  No nausea, vomitting, or diarrhoea. No bright red blood per rectum, no abdominal pain, change in bowel movement, or constipation.  Genito Urinary  No frequency, urgency, dysuria, + postmenopausal bleeding Musculo Skeletal  No myalgia, arthralgia, joint swelling or pain  Neurologic  No weakness, numbness, change in gait,  Psychology  No depression, anxiety, insomnia.   Vitals:  Blood pressure (!) 134/53, pulse 82, temperature 98.3 F (36.8 C), temperature source Oral, resp. rate 20, height 5' 6.5" (1.689 m), weight 272 lb 14.4 oz (123.8 kg), SpO2 100 %.  Physical Exam: WD in NAD Neck  Supple NROM, without any enlargements.  Lymph Node Survey No cervical supraclavicular or inguinal adenopathy Cardiovascular  Pulse normal rate, regularity and rhythm. S1 and S2 normal.  Lungs  Clear to auscultation bilateraly, without wheezes/crackles/rhonchi. Good air movement.  Skin  No rash/lesions/breakdown  Psychiatry  Alert and oriented to person, place, and time  Abdomen  Normoactive bowel sounds, abdomen soft, non-tender and obese without evidence of hernia. Back No CVA tenderness Genito Urinary  Vulva/vagina: Normal external female genitalia.  No lesions. No discharge or bleeding.  Bladder/urethra:  No lesions or masses, well supported bladder  Vagina: no lesions visible, long vaginal length.  Cervix: dense and hard consistent with tumor infiltration. Mucosa grossly normal, but slightly hyperemic and bleeds easily on touch.   Uterus: Bulky, enlarged, fixed,  unable to palpable parametria due to body habitus.  Adnexa: Body habitus limits ability to palpate masses. Rectal  Good tone, no masses no cul de sac nodularity.  Extremities  No bilateral cyanosis, clubbing or edema.  Procedure Note:  Preop Dx: postmenopausal bleeding, pelvic masses Postop Dx: same Procedure: cervical and endometrial biopsies Surgeon: Dorann Ou, MD EBL: <10cc Specimens: 1/ ectocervix 2/ endocervical curettings 3/ endometrial biopsy Complications: none Procedure Details: The patient provided verbal consent and verbal timeout was performed.  The patient was placed in lithotomy position and speculum inserted into the vagina.  The cervix was visualized.  A Kevorkian biopsy forcep was used to take a representative sample from the cervix.  Due to the firm nature of the cervix it was difficult to obtain good tissue.  The narrow curette was used to curette the endocervical canal and specimen was taken for endocervical curettings.  The endometrial catheter was attempted to be passed into the endometrial cavity however significant resistance was met at the level of the endocervix.  The catheter was snaked approximately 5 cm into the endometrium and aspirated and a small amount of tissue was obtained.  There was a small amount of bleeding encountered from the friable cervix.  Patient tolerated the procedure well.  Specimens were sent for histopathology.    Thereasa Solo, MD  07/26/2018, 3:49 PM

## 2018-07-27 ENCOUNTER — Telehealth: Payer: Self-pay | Admitting: Gynecologic Oncology

## 2018-07-27 ENCOUNTER — Telehealth: Payer: Self-pay | Admitting: Oncology

## 2018-07-27 LAB — CA 125: Cancer Antigen (CA) 125: 835 U/mL — ABNORMAL HIGH (ref 0.0–38.1)

## 2018-07-27 NOTE — Telephone Encounter (Signed)
Returned call to patient.  Patient informed of biopsy results.  Informed that surgery has been cancelled since a cancer diagnosis has been obtained from the biopsy taken in the office.  Advised she needs to proceed with the CT scan of the chest in the am and with the appt with Dr. Darnell Level on Monday. All questions and upcoming appts reviewed.  Discussed that she can stop taking lovenox injections at this time and resume her xarelto.  Her last lovenox injection was at 11:30am today and she normally takes her xarelto in the evenings.  Advised to call for any needs.

## 2018-07-27 NOTE — Telephone Encounter (Signed)
Attempted to call patient to discuss biopsy results.  Left message asking her to please call the office.

## 2018-07-27 NOTE — Telephone Encounter (Signed)
Requested MSI testing on accession [id number] with Amber in Pathology.

## 2018-07-28 ENCOUNTER — Encounter (HOSPITAL_COMMUNITY): Payer: Self-pay

## 2018-07-28 ENCOUNTER — Ambulatory Visit (HOSPITAL_COMMUNITY)
Admission: RE | Admit: 2018-07-28 | Discharge: 2018-07-28 | Disposition: A | Discharge status: Home / Self Care | Payer: Medicare Other | Source: Ambulatory Visit | Attending: Gynecologic Oncology | Admitting: Gynecologic Oncology

## 2018-07-28 ENCOUNTER — Encounter (HOSPITAL_COMMUNITY): Payer: Medicare Other

## 2018-07-28 DIAGNOSIS — C541 Malignant neoplasm of endometrium: Secondary | ICD-10-CM | POA: Insufficient documentation

## 2018-07-28 MED ORDER — SODIUM CHLORIDE (PF) 0.9 % IJ SOLN
INTRAMUSCULAR | Status: AC
  Filled 2018-07-28: qty 50

## 2018-07-28 MED ORDER — IOHEXOL 300 MG/ML  SOLN
75.0000 mL | Freq: Once | INTRAMUSCULAR | Status: AC | PRN
  Administered 2018-07-28: 75 mL via INTRAVENOUS

## 2018-07-29 ENCOUNTER — Ambulatory Visit (HOSPITAL_COMMUNITY): Admission: RE | Admit: 2018-07-29 | Payer: Medicare Other | Source: Ambulatory Visit | Admitting: Gynecologic Oncology

## 2018-07-29 ENCOUNTER — Encounter (HOSPITAL_COMMUNITY): Admission: RE | Payer: Self-pay | Source: Ambulatory Visit

## 2018-07-29 SURGERY — LAPAROSCOPY, DIAGNOSTIC
Anesthesia: General

## 2018-07-30 ENCOUNTER — Telehealth: Payer: Self-pay | Admitting: Oncology

## 2018-07-30 NOTE — Telephone Encounter (Signed)
Erin Esparza and confirmed her appointment on Monday, 08/02/18 with Dr. Alvy Bimler at 11:15.  She verbalized agreement.

## 2018-07-31 ENCOUNTER — Other Ambulatory Visit: Payer: Self-pay | Admitting: Family Medicine

## 2018-08-02 ENCOUNTER — Telehealth: Payer: Self-pay | Admitting: Oncology

## 2018-08-02 ENCOUNTER — Inpatient Hospital Stay: Payer: Medicare Other | Attending: Gynecologic Oncology | Admitting: Hematology and Oncology

## 2018-08-02 ENCOUNTER — Encounter: Payer: Self-pay | Admitting: Hematology and Oncology

## 2018-08-02 ENCOUNTER — Telehealth: Payer: Self-pay | Admitting: Hematology and Oncology

## 2018-08-02 DIAGNOSIS — C55 Malignant neoplasm of uterus, part unspecified: Secondary | ICD-10-CM | POA: Insufficient documentation

## 2018-08-02 DIAGNOSIS — Z5111 Encounter for antineoplastic chemotherapy: Secondary | ICD-10-CM | POA: Diagnosis not present

## 2018-08-02 DIAGNOSIS — Z86718 Personal history of other venous thrombosis and embolism: Secondary | ICD-10-CM | POA: Insufficient documentation

## 2018-08-02 DIAGNOSIS — Z8672 Personal history of thrombophlebitis: Secondary | ICD-10-CM

## 2018-08-02 DIAGNOSIS — I10 Essential (primary) hypertension: Secondary | ICD-10-CM | POA: Insufficient documentation

## 2018-08-02 DIAGNOSIS — Z7901 Long term (current) use of anticoagulants: Secondary | ICD-10-CM | POA: Diagnosis not present

## 2018-08-02 DIAGNOSIS — Z853 Personal history of malignant neoplasm of breast: Secondary | ICD-10-CM | POA: Insufficient documentation

## 2018-08-02 MED ORDER — ENOXAPARIN SODIUM 120 MG/0.8ML ~~LOC~~ SOLN: 120.0000 mg | Freq: Two times a day (BID) | SUBCUTANEOUS | 0 refills | Status: DC

## 2018-08-02 NOTE — Telephone Encounter (Signed)
Gave avs and calendar ° °

## 2018-08-02 NOTE — Progress Notes (Signed)
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

## 2018-08-02 NOTE — Telephone Encounter (Signed)
Left a message for Erin Esparza with her port appointment. Requested a return call.

## 2018-08-03 ENCOUNTER — Telehealth: Payer: Self-pay | Admitting: Oncology

## 2018-08-03 ENCOUNTER — Other Ambulatory Visit: Payer: Self-pay | Admitting: Family Medicine

## 2018-08-03 ENCOUNTER — Inpatient Hospital Stay: Payer: Medicare Other

## 2018-08-03 ENCOUNTER — Encounter: Payer: Self-pay | Admitting: Hematology and Oncology

## 2018-08-03 NOTE — Progress Notes (Signed)
FMLA successfully faxed to [employer] at [phone]. Mailed copy to patient address on file.

## 2018-08-03 NOTE — Telephone Encounter (Signed)
Erin Esparza and reviewed the instructions for the port insertion on 08/06/18.  Advised her to be there at 12:00, npo after 7 am and to hold her afternoon dose of Lovenox.  She will also need a driver.  She verbalized agreement and understanding. She did have a question about Lovenox because her first dose will be due at 12 pm on the Thursday.  She is wondering if this is too late.  Called IR and talked with Lanny Hurst.  He said to have her hold the second dose on Thursday.  She also said she forgot to tell Dr. Alvy Bimler that she has "shots in her foot" for arthritis every few months.  Her last injection was in November.  She is wondering if this is OK during chemo if she would need one.

## 2018-08-03 NOTE — Progress Notes (Signed)
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  She does not need further follow-up at the breast clinic due to her stage IV metastatic cancer She has appointment to see genetic counselor for further genetic testing  DEEP VENOUS THROMBOPHLEBITIS, HX OF She is on chronic anticoagulation therapy due to history of blood clots She will continue treatment I will switch her to Lovenox injection in anticipation for port placement She will hold Xarelto for 2 days minimum before port placement, hold Lovenox injection minimum 24 hours before port placement and resume after port placement.   Orders Placed This Encounter  Procedures  . IR IMAGING GUIDED PORT INSERTION    Standing Status:   Future    Standing Expiration Date:   10/02/2019    Order Specific Question:   Reason for Exam (SYMPTOM  OR DIAGNOSIS REQUIRED)    Answer:   need port for chemo to start 3/11    Order Specific Question:   Preferred Imaging Location?    Answer:   [facility]  . Comprehensive metabolic panel    Standing Status:   Standing    Number of  Occurrences:   22    Standing Expiration Date:   08/03/2019  . CBC with Differential/Platelet    Standing Status:   Standing    Number of Occurrences:   22    Standing Expiration Date:   08/03/2019  . CA 125    Standing Status:   Standing    Number of Occurrences:   11    Standing Expiration Date:   08/03/2019     CHIEF COMPLAINTS/PURPOSE OF CONSULTATION:  Metastatic uterine cancer, history of breast cancer, for further management  HISTORY OF PRESENTING ILLNESS:  Erin Esparza 78 y.o. female is here because of recent diagnosis of stage IV metastatic uterine cancer She is accompanied by her daughter, Arnette Schaumann and son The patient have remote history of breast cancer on the left, status post surgery and adjuvant antiestrogen therapy Starting late last year, she developed postmenopausal bleeding.  She takes anticoagulation therapy for history of blood clots She underwent further evaluation including imaging study and biopsy I have reviewed her chart and materials related to her cancer extensively and collaborated history with the patient. Summary of oncologic history is as follows: Oncology History   MSI stable, papillary serous     History of left breast cancer   10/23/2011 Mammogram    Suspicious mass at 10:00 position 7 cm from the left nipple. Ultrasound irregular hypoechoic mass 7 x 6 x 5 mm    01/06/2012 Initial Biopsy    Initial biopsy was benign . 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 DVT and PE diagnosed in 2012 now on Xarelto     Uterine cancer ([place])   05/31/2018 Initial Diagnosis     She presented with postmenopausal bleeding    07/21/2018 Imaging    Ct scan of abdomen and pelvis showed large uterine mass with diffuse lymphadenopathy and possibly liver mass    07/26/2018 Pathology Results    1. Cervix, biopsy - HIGH GRADE CARCINOMA. - SEE COMMENT. 2. Endocervix, curettage - HIGH GRADE CARCINOMA. - SEE COMMENT. 3. Endometrium, biopsy - HIGH GRADE CARCINOMA. - SEE COMMENT. Microscopic Comment 1. - 3. The carcinoma in the three specimens is morphologically similar and has features suggestive of papillary serous carcinoma.    07/26/2018 Tumor Marker    Patient's tumor was tested for the following markers: CA-125 Results of the tumor marker test revealed 835    07/28/2018 Imaging    1. Several (at least 10) solid pulmonary nodules scattered throughout both lungs are new since 2012 chest CT, likely representing pulmonary metastases, largest 6 mm, below PET resolution. 2. Redemonstration of hypodense 2.5 cm segment 7 right liver lobe mass, indeterminate, suspicious for liver metastasis. 3. 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 4. Three-vessel coronary atherosclerosis.  Aortic Atherosclerosis (ICD10-I70.0).    08/03/2018 Cancer Staging    Staging form: Corpus Uteri - Carcinoma and Carcinosarcoma, AJCC 8th Edition - Clinical: FIGO Stage IVB (cT3b, cN2, cM1) - Signed by [name], MD on 08/03/2018    Her vaginal bleeding has subsided to spotting only. The patient is morbidly obese and somewhat sedentary. She lives with her daughter.  She denies history of fall  MEDICAL HISTORY:  Past Medical History:  Diagnosis Date  . Abdominal pain 10/17/2016  . Arthritis    "both feet" (10/17/2016)  . Asthma   . Breast cancer ([place]) 01/06/12   left breast invasive ductal ca,dcis,ER/PR=+,  . Breast mass in female   . Cataract   . Clotting disorder ([place])   . Diastolic congestive heart failure ([place])   . DVT (deep venous thrombosis) ([place]) 1990's?    LLE  . Hypertension   . Insulin resistance   . Pulmonary embolism ([place]) ~ 2012  . Radiation 02/12/12 -03/12/12   left breast, total 50gy  . Right ankle pain   . Shortness of breath   . Venous stasis of lower extremity   . Wears dentures    upper  . Wears glasses   . Wears partial dentures    lower    SURGICAL HISTORY: Past Surgical History:  Procedure Laterality Date  . BREAST BIOPSY Left 11/03/11   10 o'clock=benign breast parenchyma  . BREAST LUMPECTOMY Left 01/06/12    Dr. Erroll Luna    SOCIAL HISTORY: Social History   Socioeconomic History  . Marital status: Widowed    Spouse name: Not on file  . Number of children: 5  . Years of education: 60  . Highest education level: Associate degree: occupational, Hotel manager, or vocational program  Occupational History  . Occupation: Retired    Fish farm manager: RETIRED  Social Needs  . Financial resource strain: Not  hard at all  . Food insecurity:    Worry: Never true    Inability: Never true  . Transportation needs:    Medical: No    Non-medical: No  Tobacco Use  . Smoking status: Former Smoker    Packs/day: 0.30    Years: 15.00    Pack years: 4.50    Types: Cigarettes  . Smokeless tobacco: Never Used  . Tobacco comment: "stopped in the 1990s"  Substance and Sexual Activity  . Alcohol use: No  . Drug use: No  . Sexual activity: Not Currently  Lifestyle  . Physical activity:    Days per week: 2 days    Minutes per session: 20 min  . Stress: Not at all  Relationships  . Social connections:    Talks on phone: More than three times a week    Gets together: More than three times a week    Attends religious service: More than 4 times per year    Active member of club or organization: No    Attends meetings of clubs or organizations: Never    Relationship status: Widowed  . Intimate partner violence:    Fear of current or ex partner: No    Emotionally abused: No    Physically abused: No    Forced sexual activity:  No  Other Topics Concern  . Not on file  Social History Narrative      Emergency Contact: daughter, Freddie Breech (c) [phone] or Arnette Schaumann (Loletha Grayer) [phone]   Who lives with you: daughter - Arnette Schaumann and granddaughter   Lives in one level house. Has grab bars in bathroom at tub. No throw rugs. Smoke alarms   Any pets: 2 dogs, pit bull and fox terrier   Diet: Pt has a varied diet. Does not eat cheese, eats meat, fruit, and vegetables. Drinks tea, water, occasional soda.   Exercise: Pt regularly does low impact aerobic exercises with TV program, walks, stretches, and occasional water aerobics.   Seatbelts: Pt reports wearing seatbelt when in vehicles.    Hobbies: Bingo       FAMILY HISTORY: Family History  Problem Relation Age of Onset  . Heart disease Mother   . Heart attack Mother   . Breast cancer Sister   . Parkinson's disease Brother   . Pulmonary embolism Daughter   . Diabetes Daughter   . Pulmonary embolism Daughter   . Breast cancer Sister   . Cancer Brother        unsure  . Other Brother        MVA  . Other Brother        MVA  . COPD Brother     ALLERGIES:  has No Known Allergies.  MEDICATIONS:  Current Outpatient Medications  Medication Sig Dispense Refill  . acetaminophen (TYLENOL) 500 MG tablet Take 1 tablet (500 mg total) by mouth every 6 (six) hours as needed. (Patient taking differently: Take 500 mg by mouth every 6 (six) hours as needed for moderate pain. ) 30 tablet 3  . Calcium Carbonate (CALTRATE 600 PO) Take 1 tablet by mouth 2 (two) times daily.     . enalapril (VASOTEC) 20 MG tablet Take 1 tablet (20 mg total) by mouth 2 (two) times daily. 180 tablet 2  . enoxaparin (LOVENOX) 120 MG/0.8ML injection Inject 0.8 mLs (120 mg total) into the skin every 12 (twelve) hours. 20 Syringe 0  . furosemide (LASIX) 40 MG tablet TAKE 1 TABLET BY MOUTH DAILY AS NEEDED FOR LEG  SWELLING 90 tablet 2  . hydrochlorothiazide (HYDRODIURIL) 25 MG tablet TAKE 1 TABLET(25 MG) BY MOUTH  DAILY 90 tablet 1  . Multiple Vitamin (MULTIVITAMIN) tablet Take 1 tablet by mouth daily.    . Multiple Vitamins-Minerals (ICAPS AREDS 2 PO) Take 1 capsule by mouth 2 (two) times daily.    . Multiple Vitamins-Minerals (WOMENS BONE HEALTH PO) Take 1 tablet by mouth daily.    . rivaroxaban (XARELTO) 20 MG TABS tablet Take 1 tablet (20 mg total) by mouth daily. 90 tablet 0  . senna (SENOKOT) 8.6 MG TABS tablet Take 1 tablet (8.6 mg total) by mouth daily. 120 each 0  . Vitamin D, Cholecalciferol, 1000 units CAPS Take by mouth daily.    Grant Ruts INHUB 100-50 MCG/DOSE AEPB INHALE 1 PUFF BY MOUTH TWICE DAILY AS DIRECTED (Patient taking differently: Inhale 1 puff into the lungs daily. ) 60 each 0   No current facility-administered medications for this visit.     REVIEW OF SYSTEMS:   Constitutional: Denies fevers, chills or abnormal night sweats Eyes: Denies blurriness of vision, double vision or watery eyes Ears, nose, mouth, throat, and face: Denies mucositis or sore throat Respiratory: Denies cough, dyspnea or wheezes Cardiovascular: Denies palpitation, chest discomfort or lower extremity swelling Gastrointestinal:  Denies nausea, heartburn or change in bowel habits Skin: Denies abnormal skin rashes Lymphatics: Denies new lymphadenopathy or easy bruising Neurological:Denies numbness, tingling or new weaknesses Behavioral/Psych: Mood is stable, no new changes  All other systems were reviewed with the patient and are negative.  PHYSICAL EXAMINATION: ECOG PERFORMANCE STATUS: 2 - Symptomatic, <50% confined to bed  Vitals:   08/02/18 1121  BP: (!) 143/56  Pulse: 96  Resp: 18  Temp: 98.3 F (36.8 C)  SpO2: 100%   Filed Weights   08/02/18 1121  Weight: 268 lb 12.8 oz (121.9 kg)    GENERAL:alert, no distress and comfortable SKIN: skin color, texture, turgor are normal, no rashes or significant lesions EYES: normal, conjunctiva are pink and non-injected, sclera clear OROPHARYNX:no  exudate, no erythema and lips, buccal mucosa, and tongue normal  NECK: supple, thyroid normal size, non-tender, without nodularity LYMPH:  no palpable lymphadenopathy in the cervical, axillary or inguinal LUNGS: clear to auscultation and percussion with normal breathing effort HEART: regular rate & rhythm and no murmurs and no lower extremity edema ABDOMEN:abdomen soft, non-tender and normal bowel sounds Musculoskeletal:no cyanosis of digits and no clubbing  PSYCH: alert & oriented x 3 with fluent speech NEURO: no focal motor/sensory deficits  LABORATORY DATA:  I have reviewed the data as listed Lab Results  Component Value Date   WBC 8.3 07/26/2018   HGB 10.4 (L) 07/26/2018   HCT 33.1 (L) 07/26/2018   MCV 95.4 07/26/2018   PLT 283 07/26/2018   Recent Labs    07/26/18 1342  NA 139  K 4.0  CL 103  CO2 25  GLUCOSE 96  BUN 19  CREATININE 1.15*  CALCIUM 9.8  GFRNONAA 45*  GFRAA 52*  PROT 8.5*  ALBUMIN 3.5  AST 20  ALT 8  ALKPHOS 84  BILITOT 0.3    RADIOGRAPHIC STUDIES: I have reviewed multiple imaging studies with the patient and family I have personally reviewed the radiological images as listed and agreed with the findings in the report. Ct Chest W Contrast  Result Date: 07/28/2018 CLINICAL DATA:  Recent diagnosis of endometrial cancer, presenting for chest staging. History of left breast cancer. EXAM: CT CHEST WITH CONTRAST TECHNIQUE: Multidetector CT imaging of  the chest was performed during intravenous contrast administration. CONTRAST:  48m OMNIPAQUE IOHEXOL 300 MG/ML  SOLN COMPARISON:  03/12/2011 chest CT angiogram. FINDINGS: Cardiovascular: Top-normal heart size. No significant pericardial effusion/thickening. Three-vessel coronary atherosclerosis. Atherosclerotic nonaneurysmal thoracic aorta. Normal caliber pulmonary arteries. No central pulmonary emboli. Mediastinum/Nodes: Subcentimeter hypodense bilateral thyroid lobe nodules. Unremarkable esophagus. No  pathologically enlarged axillary, mediastinal or hilar lymph nodes. Lungs/Pleura: No pneumothorax. No pleural effusion. No acute consolidative airspace disease or lung masses. There are several (at least 10) new solid pulmonary nodules scattered throughout both lungs compared to 03/12/2011 chest CT study, largest 6 mm in the peripheral right upper lobe (series 5/image 62), 6 mm in the peripheral left upper lobe (series 5/image 82) and 4 mm in the posterior left upper lobe (series 5/image 35). Upper abdomen: Hypodense 2.5 cm segment 7 right liver mass (series 2/image 111). Additional scattered subcentimeter hypodense liver lesions are too small to characterize. Partially visualized left para-aortic adenopathy. Simple 2.2 cm medial upper left renal cyst. Musculoskeletal: No aggressive appearing focal osseous lesions. Moderate thoracic spondylosis. IMPRESSION: 1. Several (at least 10) solid pulmonary nodules scattered throughout both lungs are new since 2012 chest CT, likely representing pulmonary metastases, largest 6 mm, below PET resolution. 2. Redemonstration of hypodense 2.5 cm segment 7 right liver lobe mass, indeterminate, suspicious for liver metastasis. 3. 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 4. Three-vessel coronary atherosclerosis. Aortic Atherosclerosis (ICD10-I70.0). Electronically Signed   By: JIlona SorrelM.D.   On: 07/28/2018 11:55    I spent 55 minutes counseling the patient face to face. The total time spent in the appointment was 60 minutes and more than 50% was on counseling.  All questions were answered. The patient knows to call the clinic with any problems, questions or concerns.  NHeath Lark MD 08/03/2018 3:23 PM

## 2018-08-03 NOTE — Assessment & Plan Note (Signed)
She is on chronic anticoagulation therapy due to history of blood clots She will continue treatment I will switch her to Lovenox injection in anticipation for port placement She will hold Xarelto for 2 days minimum before port placement, hold Lovenox injection minimum 24 hours before port placement and resume after port placement.

## 2018-08-03 NOTE — Assessment & Plan Note (Signed)
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

## 2018-08-03 NOTE — Telephone Encounter (Signed)
No injection for arthritis for now

## 2018-08-03 NOTE — Telephone Encounter (Signed)
Called Erin Esparza back and gave her message from Dr. Alvy Bimler. She verbalized agreement.  Also advised her in teach back mode to take her last dose of Lovenox Thursday am.  Also discussed with her daughter, Arnette Schaumann, who is administering the injections.  She said she will give the last injection Thursday at around 10 am when it is due.

## 2018-08-03 NOTE — Assessment & Plan Note (Signed)
She has completed adjuvant treatment for breast cancer.  She does not need further follow-up at the breast clinic due to her stage IV metastatic cancer She has appointment to see genetic counselor for further genetic testing

## 2018-08-05 ENCOUNTER — Telehealth: Payer: Self-pay | Admitting: Hematology and Oncology

## 2018-08-05 ENCOUNTER — Encounter: Payer: Self-pay | Admitting: Licensed Clinical Social Worker

## 2018-08-05 ENCOUNTER — Inpatient Hospital Stay (HOSPITAL_BASED_OUTPATIENT_CLINIC_OR_DEPARTMENT_OTHER): Payer: Medicare Other | Admitting: Licensed Clinical Social Worker

## 2018-08-05 ENCOUNTER — Inpatient Hospital Stay (HOSPITAL_BASED_OUTPATIENT_CLINIC_OR_DEPARTMENT_OTHER): Payer: Medicare Other | Admitting: Hematology and Oncology

## 2018-08-05 ENCOUNTER — Other Ambulatory Visit: Payer: Self-pay | Admitting: Hematology and Oncology

## 2018-08-05 ENCOUNTER — Inpatient Hospital Stay: Payer: Medicare Other

## 2018-08-05 ENCOUNTER — Other Ambulatory Visit: Payer: Self-pay | Admitting: Student

## 2018-08-05 ENCOUNTER — Other Ambulatory Visit: Payer: Medicare Other

## 2018-08-05 VITALS — BP 138/54 | HR 86 | Temp 98.2°F | Resp 18 | Ht 66.5 in | Wt 268.0 lb

## 2018-08-05 DIAGNOSIS — C55 Malignant neoplasm of uterus, part unspecified: Secondary | ICD-10-CM

## 2018-08-05 DIAGNOSIS — Z803 Family history of malignant neoplasm of breast: Secondary | ICD-10-CM

## 2018-08-05 DIAGNOSIS — Z7189 Other specified counseling: Secondary | ICD-10-CM

## 2018-08-05 DIAGNOSIS — I10 Essential (primary) hypertension: Secondary | ICD-10-CM | POA: Diagnosis not present

## 2018-08-05 DIAGNOSIS — Z5111 Encounter for antineoplastic chemotherapy: Secondary | ICD-10-CM | POA: Diagnosis not present

## 2018-08-05 DIAGNOSIS — Z8672 Personal history of thrombophlebitis: Secondary | ICD-10-CM

## 2018-08-05 DIAGNOSIS — Z853 Personal history of malignant neoplasm of breast: Secondary | ICD-10-CM

## 2018-08-05 DIAGNOSIS — Z7183 Encounter for nonprocreative genetic counseling: Secondary | ICD-10-CM

## 2018-08-05 DIAGNOSIS — Z86718 Personal history of other venous thrombosis and embolism: Secondary | ICD-10-CM

## 2018-08-05 DIAGNOSIS — Z7901 Long term (current) use of anticoagulants: Secondary | ICD-10-CM | POA: Diagnosis not present

## 2018-08-05 DIAGNOSIS — Z8042 Family history of malignant neoplasm of prostate: Secondary | ICD-10-CM | POA: Insufficient documentation

## 2018-08-05 LAB — CBC WITH DIFFERENTIAL/PLATELET
Abs Immature Granulocytes: 0.03 10*3/uL (ref 0.00–0.07)
Basophils Absolute: 0.1 10*3/uL (ref 0.0–0.1)
Basophils Relative: 1 %
Eosinophils Absolute: 0.2 10*3/uL (ref 0.0–0.5)
Eosinophils Relative: 2 %
HCT: 31.6 % — ABNORMAL LOW (ref 36.0–46.0)
Hemoglobin: 10.1 g/dL — ABNORMAL LOW (ref 12.0–15.0)
Immature Granulocytes: 0 %
Lymphocytes Relative: 15 %
Lymphs Abs: 1.4 10*3/uL (ref 0.7–4.0)
MCH: 29.4 pg (ref 26.0–34.0)
MCHC: 32 g/dL (ref 30.0–36.0)
MCV: 91.9 fL (ref 80.0–100.0)
Monocytes Absolute: 0.9 10*3/uL (ref 0.1–1.0)
Monocytes Relative: 10 %
Neutro Abs: 6.5 10*3/uL (ref 1.7–7.7)
Neutrophils Relative %: 72 %
Platelets: 348 10*3/uL (ref 150–400)
RBC: 3.44 MIL/uL — ABNORMAL LOW (ref 3.87–5.11)
RDW: 12.7 % (ref 11.5–15.5)
WBC: 9 10*3/uL (ref 4.0–10.5)
nRBC: 0 % (ref 0.0–0.2)

## 2018-08-05 LAB — COMPREHENSIVE METABOLIC PANEL
ALT: 9 U/L (ref 0–44)
AST: 21 U/L (ref 15–41)
Albumin: 3.2 g/dL — ABNORMAL LOW (ref 3.5–5.0)
Alkaline Phosphatase: 77 U/L (ref 38–126)
Anion gap: 8 (ref 5–15)
BUN: 16 mg/dL (ref 8–23)
CO2: 27 mmol/L (ref 22–32)
Calcium: 10.5 mg/dL — ABNORMAL HIGH (ref 8.9–10.3)
Chloride: 101 mmol/L (ref 98–111)
Creatinine, Ser: 1.1 mg/dL — ABNORMAL HIGH (ref 0.44–1.00)
GFR calc Af Amer: 55 mL/min — ABNORMAL LOW (ref >60)
GFR calc non Af Amer: 48 mL/min — ABNORMAL LOW (ref >60)
Glucose, Bld: 94 mg/dL (ref 70–99)
Potassium: 4.1 mmol/L (ref 3.5–5.1)
Sodium: 136 mmol/L (ref 135–145)
Total Bilirubin: 0.3 mg/dL (ref 0.3–1.2)
Total Protein: 8.7 g/dL — ABNORMAL HIGH (ref 6.5–8.1)

## 2018-08-05 MED ORDER — ONDANSETRON HCL 8 MG PO TABS: 8.0000 mg | ORAL_TABLET | Freq: Three times a day (TID) | ORAL | 1 refills | Status: AC | PRN

## 2018-08-05 MED ORDER — PROCHLORPERAZINE MALEATE 10 MG PO TABS: 10.0000 mg | ORAL_TABLET | Freq: Four times a day (QID) | ORAL | 1 refills | Status: DC | PRN

## 2018-08-05 MED ORDER — ENALAPRIL MALEATE 20 MG PO TABS: 20.0000 mg | ORAL_TABLET | Freq: Every day | ORAL | 2 refills | Status: DC

## 2018-08-05 MED ORDER — DEXAMETHASONE 4 MG PO TABS: ORAL_TABLET | ORAL | 1 refills | Status: DC

## 2018-08-05 MED ORDER — LIDOCAINE-PRILOCAINE 2.5-2.5 % EX CREA: TOPICAL_CREAM | CUTANEOUS | 3 refills | Status: AC

## 2018-08-05 NOTE — Progress Notes (Signed)
REFERRING PROVIDER: [name], MD [address] [place], [place] [address]  PRIMARY PROVIDER:  Lovenia Kim, MD  PRIMARY REASON FOR VISIT:  1. History of left breast cancer   2. Family history of breast cancer   3. Family history of prostate cancer   4. Malignant neoplasm of uterus, unspecified site [facility])      HISTORY OF PRESENT ILLNESS:   Erin Esparza, a 78 y.o. female, was seen for a [place] cancer genetics consultation at the request of Dr. Alvy Bimler due to a personal and family history of cancer.  Erin Esparza presents to clinic today to discuss the possibility of a hereditary predisposition to cancer, genetic testing, and to further clarify her future cancer risks, as well as potential cancer risks for family members.   At the age of 89, Erin Esparza was diagnosed with left breast cancer. This was treated with lumpectomy, radiation and antiestrogen therapy.  At the age of 6, Erin Esparza was diagnosed with uterine cancer. This is currently being treated with chemotherapy.   CANCER HISTORY:  Oncology History   MSI stable, papillary serous     History of left breast cancer   10/23/2011 Mammogram    Suspicious mass at 10:00 position 7 cm from the left nipple. Ultrasound irregular hypoechoic mass 7 x 6 x 5 mm    01/06/2012 Initial Biopsy    Initial biopsy was benign . 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 Cervix, biopsy - HIGH GRADE CARCINOMA. - SEE COMMENT. 2. Endocervix, curettage - HIGH GRADE CARCINOMA. - SEE COMMENT. 3. Endometrium, biopsy - HIGH GRADE CARCINOMA. - SEE COMMENT. Microscopic Comment 1. - 3. The carcinoma in the three specimens is morphologically similar and has features suggestive of papillary serous carcinoma.    07/26/2018 Tumor Marker    Patient's tumor was tested for the following markers: CA-125 Results of the tumor marker test revealed 835    07/28/2018 Imaging    1. Several (at least 10) solid pulmonary nodules scattered throughout both lungs are new since 2012 chest CT, likely representing pulmonary metastases, largest 6 mm, below PET resolution. 2. Redemonstration of hypodense 2.5 cm segment 7 right liver lobe mass, indeterminate, suspicious for liver metastasis. 3. 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 4. Three-vessel coronary atherosclerosis.  Aortic Atherosclerosis (ICD10-I70.0).    08/03/2018 Cancer Staging    Staging form: Corpus Uteri - Carcinoma and Carcinosarcoma, AJCC 8th Edition - Clinical: FIGO Stage IVB (cT3b, cN2, cM1) - Signed by [name], MD on 08/03/2018      HORMONAL RISK FACTORS:  Menarche was at age 62.  First live birth at age 10.  OCP use for approximately  0 years.  Ovaries intact: yes.  Hysterectomy: no.  Menopausal status: postmenopausal.  HRT use: 0 years. Colonoscopy: no; not examined. Mammogram within the last year: yes.   Past Medical History:  Diagnosis Date  . Abdominal pain 10/17/2016  . Arthritis    "both feet" (10/17/2016)  . Asthma   . Breast cancer ([place]) 01/06/12   left breast invasive ductal ca,dcis,ER/PR=+,  . Breast mass in female   . Cataract   . Clotting disorder ([place])   . Diastolic congestive heart failure ([place])   . DVT  (deep venous thrombosis) ([place]) 1990's?   LLE  . Family history of breast cancer   . Family history of prostate cancer   . Hypertension   . Insulin resistance   . Pulmonary embolism ([place]) ~ 2012  . Radiation 02/12/12 -03/12/12   left breast, total 50gy  . Right ankle pain   . Shortness of breath   . Venous stasis of lower extremity   . Wears dentures    upper  . Wears glasses   . Wears partial dentures    lower    Past Surgical History:  Procedure Laterality Date  . BREAST BIOPSY Left 11/03/11   10 o'clock=benign breast parenchyma  . BREAST LUMPECTOMY Left 01/06/12    Dr. Erroll Luna    Social History   Socioeconomic History  . Marital status: Widowed    Spouse name: Not on file  . Number of children: 5  . Years of education: 18  . Highest education level: Associate degree: occupational, Hotel manager, or vocational program  Occupational History  . Occupation: Retired    Fish farm manager: RETIRED  Social Needs  . Financial resource strain: Not hard at all  . Food insecurity:    Worry: Never true    Inability: Never true  . Transportation needs:    Medical: No    Non-medical: No  Tobacco Use  . Smoking status: Former Smoker    Packs/day: 0.30    Years: 15.00    Pack years: 4.50    Types: Cigarettes  . Smokeless tobacco: Never Used  . Tobacco comment: "stopped in the 1990s"  Substance and Sexual Activity  . Alcohol use: No  . Drug use: No  . Sexual activity: Not Currently  Lifestyle  . Physical activity:    Days per week: 2 days    Minutes per session: 20 min  . Stress: Not at all  Relationships  . Social connections:    Talks on phone: More than three times a week    Gets together: More than three times a week    Attends religious service: More than 4 times per year    Active member of club or organization: No    Attends meetings of clubs or organizations: Never    Relationship status: Widowed  Other Topics Concern  . Not on file  Social History Narrative       Emergency Contact: daughter, Freddie Breech (c) [phone] or Arnette Schaumann (Loletha Grayer) [phone]   Who lives with you: daughter - Arnette Schaumann and granddaughter   Lives in one level house. Has grab bars in bathroom at tub. No throw rugs. Smoke alarms   Any pets: 2 dogs, pit bull and fox terrier   Diet: Pt has a varied diet. Does not eat cheese, eats meat, fruit, and vegetables. Drinks tea, water, occasional soda.   Exercise: Pt regularly does low impact aerobic exercises with TV program, walks, stretches, and occasional water aerobics.   Seatbelts: Pt reports wearing  seatbelt when in vehicles.    Hobbies: Bingo        FAMILY HISTORY:  We obtained a detailed, 4-generation family history.  Significant diagnoses are listed below: Family History  Problem Relation Age of Onset  . Heart disease Mother   . Heart attack Mother   . Breast cancer Sister 61  . Parkinson's disease Brother   . Prostate cancer Brother   . Pulmonary embolism Daughter   . Diabetes Daughter   . Pulmonary embolism Daughter   . Breast cancer Sister   . Other Brother        MVA  . Other Brother        MVA  . COPD Brother   . Breast cancer Niece        dx 26s    Ms. Tangeman has 2 daughters, ages 87 and 49, and 3 sons, ages 67, 29 and 40. No cancers for her children or grandchildren. Ms. Murfin had 6 sisters and 5 brothers. Two of her sisters had breast cancer. One died at 104. This sister had a daughter who also was diagnosed with breast cancer, in her 40s, and passed away in her 65s. Ms Holstrom's other sister with breast cancer was diagnosed when she was older than 25. Ms. Amsden last remaining sibling, her brother, was diagnosed with prostate cancer, she is unsure of what age, but he is currently living at 37. No other cancers for her brothers/sisters/nieces/nephews.  Ms. Schoenfeldt mother died at 64. Ms. Klosinski had 3 maternal uncles, 5 maternal aunts. No cancers for these relatives or her maternal cousins that she is aware of, although  she has limited information. Her maternal grandparents both died when they were "older."   Ms. Riehle's father died in his 36s or 27s. He had a brother and 3 sisters, Ms. Lampert does not have information about these paternal aunts/uncle. She also does not have information about her paternal cousins or grandparents.  Ms. Corriher is unaware of previous family history of genetic testing for hereditary cancer risks. Patient's ancestors are of African American/Caucasian descent. There is no reported Ashkenazi Jewish ancestry. There is no known consanguinity.  GENETIC COUNSELING ASSESSMENT: ERENDIRA CRABTREE is a 78 y.o. female with a personal and family history of breast cancer which is somewhat suggestive of a Hereditary Cancer Predisposition Syndrome. We, therefore, discussed and recommended the following at today's visit.   DISCUSSION: We discussed that about 5-10% of breast cancer cases are hereditary with most cases due to BRCA mutations.  Other genes associated with hereditary breast cancer cases include ATM, CHEK2 and PALB2.   We discussed that there are genes associated with uterine cancer as well. We reviewed the characteristics, features and inheritance patterns of hereditary cancer syndromes. We also discussed genetic testing, including the appropriate family members to test, the process of testing, insurance coverage and turn-around-time for results. We discussed the implications of a negative, positive and/or variant of uncertain significant result. We recommended Ms. Aikey pursue genetic testing for the [facility] Common Hereditary Cancers Panel.  The Common Hereditary Cancers Panel offered by [facility] includes sequencing and/or deletion duplication testing of the following 47 genes: APC, ATM, AXIN2, BARD1, BMPR1A, BRCA1, BRCA2, BRIP1, CDH1, CDKN2A (p14ARF), CDKN2A (p16INK4a), CKD4, CHEK2, CTNNA1, DICER1, EPCAM (Deletion/duplication testing only), GREM1 (promoter region deletion/duplication testing only),  KIT, MEN1, MLH1, MSH2, MSH3, MSH6, MUTYH, NBN, NF1, NHTL1, PALB2, PDGFRA, PMS2, POLD1, POLE, PTEN, RAD50, RAD51C, RAD51D, SDHB, SDHC, SDHD, SMAD4, SMARCA4. STK11, TP53, TSC1, TSC2, and VHL.  The following genes were evaluated for sequence changes only: SDHA and HOXB13 c.251G>A variant only.  We discussed that if she is found to have a mutation in one of these genes, it may impact future medical management recommendations such as increased cancer screenings and consideration of risk reducing surgeries.  A positive result could also have implications for the patient's family members.  A Negative result would mean we were unable to identify a hereditary component to her personal and family history of cancer but does not rule out the possibility of a hereditary basis for her personal and family history of cancer.  There could be mutations that are undetectable by current technology, or in genes not yet tested or identified to increase cancer risk.    We discussed the potential to find a Variant of Uncertain Significance or VUS.  These are variants that have not yet been identified as pathogenic or benign, and it is unknown if this variant is associated with increased cancer risk or if this is a normal finding.  Most VUS's are reclassified to benign or likely benign.   It should not be used to make medical management decisions. With time, we suspect the lab will determine the significance of any VUS's identified if any.   Based on Ms. Poarch's personal and family history of cancer, she meets NCCN medical criteria for genetic testing. Despite that she meets criteria, she may still have an out of pocket cost. The lab will notify her of an OOP if any.  PLAN: After considering the risks, benefits, and limitations, Ms. Blyth  provided informed consent to pursue genetic testing. Ms. Beyersdorf would like to wait to have her blood drawn for genetics until she has her port placed. We will follow up to coordinate a lab  appointment.  Results should be available within approximately 2-3 weeks' time from her blood draw, at which point they will be disclosed by telephone to Ms. Vivona, as will any additional recommendations warranted by these results. Ms. Chancellor will receive a summary of her genetic counseling visit and a copy of her results once available. This information will also be available in Epic.  Lastly, we encouraged Ms. Mates to remain in contact with cancer genetics annually so that we can continuously update the family history and inform her of any changes in cancer genetics and testing that may be of benefit for this family.   Ms.  Conry questions were answered to her satisfaction today. Our contact information was provided should additional questions or concerns arise. Thank you for the referral and allowing [place] to share in the care of your patient.   Faith Rogue, MS Genetic Counselor [email or web address] Phone: [phone]  The patient was seen for a total of 35 minutes in face-to-face genetic counseling.  The patient was accompanied today by her daughter Arnette Schaumann.

## 2018-08-05 NOTE — Telephone Encounter (Signed)
Gave avs and calendar ° °

## 2018-08-06 ENCOUNTER — Encounter (HOSPITAL_COMMUNITY): Payer: Self-pay

## 2018-08-06 ENCOUNTER — Other Ambulatory Visit: Payer: Self-pay

## 2018-08-06 ENCOUNTER — Other Ambulatory Visit: Payer: Self-pay | Admitting: Hematology and Oncology

## 2018-08-06 ENCOUNTER — Encounter: Payer: Self-pay | Admitting: Hematology and Oncology

## 2018-08-06 ENCOUNTER — Telehealth: Payer: Self-pay | Admitting: *Deleted

## 2018-08-06 ENCOUNTER — Ambulatory Visit (HOSPITAL_COMMUNITY)
Admission: RE | Admit: 2018-08-06 | Discharge: 2018-08-06 | Disposition: A | Discharge status: Home / Self Care | Payer: Medicare Other | Source: Ambulatory Visit | Attending: Hematology and Oncology | Admitting: Hematology and Oncology

## 2018-08-06 ENCOUNTER — Ambulatory Visit (HOSPITAL_COMMUNITY)
Admission: RE | Admit: 2018-08-06 | Discharge: 2018-08-06 | Disposition: A | Discharge status: Home / Self Care | Payer: Medicare Other | Source: Ambulatory Visit

## 2018-08-06 ENCOUNTER — Ambulatory Visit: Payer: Medicare Other | Admitting: Gynecology

## 2018-08-06 DIAGNOSIS — Z853 Personal history of malignant neoplasm of breast: Secondary | ICD-10-CM | POA: Diagnosis not present

## 2018-08-06 DIAGNOSIS — I110 Hypertensive heart disease with heart failure: Secondary | ICD-10-CM | POA: Insufficient documentation

## 2018-08-06 DIAGNOSIS — C55 Malignant neoplasm of uterus, part unspecified: Secondary | ICD-10-CM | POA: Diagnosis not present

## 2018-08-06 DIAGNOSIS — Z5111 Encounter for antineoplastic chemotherapy: Secondary | ICD-10-CM | POA: Diagnosis not present

## 2018-08-06 DIAGNOSIS — Z86718 Personal history of other venous thrombosis and embolism: Secondary | ICD-10-CM | POA: Insufficient documentation

## 2018-08-06 DIAGNOSIS — Z79899 Other long term (current) drug therapy: Secondary | ICD-10-CM | POA: Diagnosis not present

## 2018-08-06 DIAGNOSIS — Z7189 Other specified counseling: Secondary | ICD-10-CM | POA: Insufficient documentation

## 2018-08-06 DIAGNOSIS — Z7901 Long term (current) use of anticoagulants: Secondary | ICD-10-CM | POA: Insufficient documentation

## 2018-08-06 DIAGNOSIS — Z87891 Personal history of nicotine dependence: Secondary | ICD-10-CM | POA: Diagnosis not present

## 2018-08-06 DIAGNOSIS — I5030 Unspecified diastolic (congestive) heart failure: Secondary | ICD-10-CM | POA: Diagnosis not present

## 2018-08-06 HISTORY — PX: IR IMAGING GUIDED PORT INSERTION: IMG5740

## 2018-08-06 LAB — CBC
HCT: 42.3 % (ref 36.0–46.0)
Hemoglobin: 12.8 g/dL (ref 12.0–15.0)
MCH: 29.3 pg (ref 26.0–34.0)
MCHC: 30.3 g/dL (ref 30.0–36.0)
MCV: 96.8 fL (ref 80.0–100.0)
Platelets: 434 10*3/uL — ABNORMAL HIGH (ref 150–400)
RBC: 4.37 MIL/uL (ref 3.87–5.11)
RDW: 12.8 % (ref 11.5–15.5)
WBC: 9.6 10*3/uL (ref 4.0–10.5)
nRBC: 0 % (ref 0.0–0.2)

## 2018-08-06 LAB — PROTIME-INR
INR: 1.1 (ref 0.8–1.2)
Prothrombin Time: 13.9 seconds (ref 11.4–15.2)

## 2018-08-06 LAB — APTT: aPTT: 28 seconds (ref 24–36)

## 2018-08-06 LAB — CA 125: Cancer Antigen (CA) 125: 901 U/mL — ABNORMAL HIGH (ref 0.0–38.1)

## 2018-08-06 MED ORDER — CEFAZOLIN SODIUM-DEXTROSE 2-4 GM/100ML-% IV SOLN
INTRAVENOUS | Status: AC
  Administered 2018-08-06: 2 g via INTRAVENOUS
  Filled 2018-08-06: qty 100

## 2018-08-06 MED ORDER — MIDAZOLAM HCL 2 MG/2ML IJ SOLN
INTRAMUSCULAR | Status: AC | PRN
  Administered 2018-08-06 (×2): 1 mg via INTRAVENOUS

## 2018-08-06 MED ORDER — LIDOCAINE HCL 1 % IJ SOLN
INTRAMUSCULAR | Status: AC
  Filled 2018-08-06: qty 20

## 2018-08-06 MED ORDER — HEPARIN SOD (PORK) LOCK FLUSH 100 UNIT/ML IV SOLN
INTRAVENOUS | Status: AC
  Filled 2018-08-06: qty 5

## 2018-08-06 MED ORDER — CEFAZOLIN SODIUM-DEXTROSE 2-4 GM/100ML-% IV SOLN
2.0000 g | Freq: Once | INTRAVENOUS | Status: AC
  Administered 2018-08-06: 2 g via INTRAVENOUS

## 2018-08-06 MED ORDER — FENTANYL CITRATE (PF) 100 MCG/2ML IJ SOLN
INTRAMUSCULAR | Status: AC | PRN
  Administered 2018-08-06 (×2): 50 ug via INTRAVENOUS

## 2018-08-06 MED ORDER — SODIUM CHLORIDE 0.9 % IV SOLN
INTRAVENOUS | Status: DC
  Administered 2018-08-06: 13:00:00 via INTRAVENOUS

## 2018-08-06 MED ORDER — HEPARIN SOD (PORK) LOCK FLUSH 100 UNIT/ML IV SOLN
INTRAVENOUS | Status: AC | PRN
  Administered 2018-08-06: 500 [IU] via INTRAVENOUS

## 2018-08-06 MED ORDER — FENTANYL CITRATE (PF) 100 MCG/2ML IJ SOLN
INTRAMUSCULAR | Status: AC
  Filled 2018-08-06: qty 2

## 2018-08-06 MED ORDER — ACETAMINOPHEN 500 MG PO TABS
1000.0000 mg | ORAL_TABLET | Freq: Once | ORAL | Status: AC
  Administered 2018-08-06: 1000 mg via ORAL
  Filled 2018-08-06: qty 2

## 2018-08-06 MED ORDER — MIDAZOLAM HCL 2 MG/2ML IJ SOLN
INTRAMUSCULAR | Status: AC
  Filled 2018-08-06: qty 2

## 2018-08-06 MED ORDER — LIDOCAINE HCL (PF) 1 % IJ SOLN
INTRAMUSCULAR | Status: AC | PRN
  Administered 2018-08-06 (×2): 10 mL

## 2018-08-06 NOTE — Discharge Instructions (Signed)
Moderate Conscious Sedation, Adult, Care After These instructions provide you with information about caring for yourself after your procedure. Your health care provider may also give you more specific instructions. Your treatment has been planned according to current medical practices, but problems sometimes occur. Call your health care provider if you have any problems or questions after your procedure. What can I expect after the procedure? After your procedure, it is common:  To feel sleepy for several hours.  To feel clumsy and have poor balance for several hours.  To have poor judgment for several hours.  To vomit if you eat too soon. Follow these instructions at home: For at least 24 hours after the procedure:   Do not: ? Participate in activities where you could fall or become injured. ? Drive. ? Use heavy machinery. ? Drink alcohol. ? Take sleeping pills or medicines that cause drowsiness. ? Make important decisions or sign legal documents. ? Take care of children on your own.  Rest. Eating and drinking  Follow the diet recommended by your health care provider.  If you vomit: ? Drink water, juice, or soup when you can drink without vomiting. ? Make sure you have little or no nausea before eating solid foods. General instructions  Have a responsible adult stay with you until you are awake and alert.  Take over-the-counter and prescription medicines only as told by your health care provider.  If you smoke, do not smoke without supervision.  Keep all follow-up visits as told by your health care provider. This is important. Contact a health care provider if:  You keep feeling nauseous or you keep vomiting.  You feel light-headed.  You develop a rash.  You have a fever. Get help right away if:  You have trouble breathing. This information is not intended to replace advice given to you by your health care provider. Make sure you discuss any questions you have  with your health care provider. Document Released: 03/09/2013 Document Revised: 10/22/2015 Document Reviewed: 09/08/2015 Elsevier Interactive Patient Education  2019 [place] Insertion, Care After This sheet gives you information about how to care for yourself after your procedure. Your health care provider may also give you more specific instructions. If you have problems or questions, contact your health care provider. What can I expect after the procedure? After the procedure, it is common to have:  Discomfort at the port insertion site.  Bruising on the skin over the port. This should improve over 3-4 days. Follow these instructions at home: [facility] care  After your port is placed, you will get a manufacturer's information card. The card has information about your port. Keep this card with you at all times.  Take care of the port as told by your health care provider. Ask your health care provider if you or a family member can get training for taking care of the port at home. A home health care nurse may also take care of the port.  Make sure to remember what type of port you have. Incision care  Follow instructions from your health care provider about how to take care of your port insertion site. Make sure you: ? Wash your hands with soap and water before and after you change your bandage (dressing). If soap and water are not available, use hand sanitizer. ? Change your dressing as told by your health care provider. ? May remove dressing in 24 to 48 hours and shower or bathe.  Keep  dressing clean and dry.  May replace bandage as necessary. ? Leave stitches (sutures), skin glue, or adhesive strips in place. These skin closures may need to stay in place for 2 weeks or longer. If adhesive strip edges start to loosen and curl up, you may trim the loose edges. Do not remove adhesive strips completely unless your health care provider tells you to do that.  Check your  port insertion site every day for signs of infection. Check for: ? Redness, swelling, or pain. ? Fluid or blood. ? Warmth. ? Pus or a bad smell. Activity  Return to your normal activities as told by your health care provider. Ask your health care provider what activities are safe for you.  Do not lift anything that is heavier than 10 lb (4.5 kg), or the limit that you are told, until your health care provider says that it is safe. General instructions  Take over-the-counter and prescription medicines only as told by your health care provider.  Do not take baths, swim, or use a hot tub until your health care provider approves. Ask your health care provider if you may take showers. You may only be allowed to take sponge baths.  Do not drive for 24 hours if you were given a sedative during your procedure.  Wear a medical alert bracelet in case of an emergency. This will tell any health care providers that you have a port.  Keep all follow-up visits as told by your health care provider. This is important. Contact a health care provider if:  You cannot flush your port with saline as directed, or you cannot draw blood from the port.  You have a fever or chills.  You have redness, swelling, or pain around your port insertion site.  You have fluid or blood coming from your port insertion site.  Your port insertion site feels warm to the touch.  You have pus or a bad smell coming from the port insertion site. Get help right away if:  You have chest pain or shortness of breath.  You have bleeding from your port that you cannot control. Summary  Take care of the port as told by your health care provider. Keep the manufacturer's information card with you at all times.  Change your dressing as told by your health care provider.  Contact a health care provider if you have a fever or chills or if you have redness, swelling, or pain around your port insertion site.  Keep all follow-up  visits as told by your health care provider.  May use EMLA cream as directed 2 weeks post port insertion day.   May restart Xarelto Sat 08/07/2018.   This information is not intended to replace advice given to you by your health care provider. Make sure you discuss any questions you have with your health care provider. Document Released: 03/09/2013 Document Revised: 12/15/2017 Document Reviewed: 12/15/2017 Elsevier Interactive Patient Education  [employer].

## 2018-08-06 NOTE — Progress Notes (Signed)
[facility] OFFICE PROGRESS NOTE  Patient Care Team: Lovenia Kim, MD as PCP - General [name], MD as Consulting Physician (Ophthalmology) [name], DPM as Consulting Physician (Podiatry) [name], [name], MD as Referring Physician (Optometry) [name], MD as Consulting Physician (Orthopedic Surgery) Awanda Mink Craige Cotta, RN as Oncology Nurse Navigator (Oncology)  ASSESSMENT & PLAN:  Uterine cancer [facility]) We reviewed the NCCN guidelines We discussed the role of chemotherapy. The intent is of palliative intent.  We discussed some of the risks, benefits, side-effects of carboplatin & Taxol. Treatment is intravenous, every 3 weeks x 6 cycles  Some of the short term side-effects included, though not limited to, including weight loss, life threatening infections, risk of allergic reactions, need for transfusions of blood products, nausea, vomiting, change in bowel habits, loss of hair, admission to hospital for various reasons, and risks of death.   Long term side-effects are also discussed including risks of infertility, permanent damage to nerve function, hearing loss, chronic fatigue, kidney damage with possibility needing hemodialysis, and rare secondary malignancy including bone marrow disorders.  The patient is aware that the response rates discussed earlier is not guaranteed.  After a long discussion, patient made an informed decision to proceed with the prescribed plan of care.   Patient education material was dispensed. We discussed premedication with dexamethasone before chemotherapy. 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  She has seen Dietitian for genetic testing  DEEP VENOUS THROMBOPHLEBITIS, HX OF She is on chronic anticoagulation therapy due to  history of blood clots I will switch her to Lovenox injection in anticipation for port placement She will hold Xarelto for 2 days minimum before port placement, hold Lovenox injection minimum 24 hours before port placement and resume after port placement.  Essential hypertension The patient is edematous on exam I recommend resumption of diuretic therapy and to hold Vasotec  Goals of care, counseling/discussion The patient is aware she has incurable disease and treatment is strictly palliative.    No orders of the defined types were placed in this encounter.   INTERVAL HISTORY: Please see below for problem oriented charting. She returns with her daughter for further follow-up and chemotherapy consent Since last time I saw her, she feels fine Denies cough, chest pain or shortness of breath No recent vaginal bleeding Appetite is stable Denies abdominal pain, bloating or recent changes in bowel habits SUMMARY OF ONCOLOGIC HISTORY: Oncology History   MSI stable, papillary serous     History of left breast cancer   10/23/2011 Mammogram    Suspicious mass at 10:00 position 7 cm from the left nipple. Ultrasound irregular hypoechoic mass 7 x 6 x 5 mm    01/06/2012 Initial Biopsy    Initial biopsy was benign . 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 Cervix, biopsy - HIGH GRADE CARCINOMA. - SEE COMMENT. 2. Endocervix, curettage - HIGH GRADE CARCINOMA. - SEE COMMENT. 3. Endometrium, biopsy - HIGH GRADE CARCINOMA. - SEE COMMENT. Microscopic Comment 1. - 3. The carcinoma in the three specimens is morphologically similar and has features suggestive of papillary serous carcinoma.    07/26/2018 Tumor Marker    Patient's tumor was tested for the following markers: CA-125 Results of the tumor marker test revealed 835    07/28/2018 Imaging    1. Several (at least 10) solid pulmonary nodules scattered throughout both lungs are new since 2012 chest CT, likely representing pulmonary metastases, largest 6 mm, below PET resolution. 2. Redemonstration of hypodense 2.5 cm segment 7 right liver lobe mass, indeterminate, suspicious for liver metastasis. 3. 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 4. Three-vessel coronary atherosclerosis.  Aortic Atherosclerosis (ICD10-I70.0).    08/03/2018 Cancer Staging    Staging form: Corpus Uteri - Carcinoma and Carcinosarcoma, AJCC 8th Edition - Clinical: FIGO Stage IVB (cT3b, cN2, cM1) - Signed by [name], MD on 08/03/2018     REVIEW OF SYSTEMS:   Constitutional: Denies fevers, chills or abnormal weight loss Eyes: Denies blurriness of vision Ears, nose, mouth, throat, and face: Denies mucositis or sore throat Respiratory: Denies cough, dyspnea or wheezes Cardiovascular: Denies palpitation, chest discomfort  Gastrointestinal:  Denies nausea, heartburn or change in bowel habits Skin: Denies abnormal skin rashes Lymphatics: Denies new lymphadenopathy or easy bruising Neurological:Denies numbness, tingling or new weaknesses Behavioral/Psych: Mood is stable, no new changes  All other systems were reviewed with the patient and are negative.  I have reviewed the past medical history, past surgical  history, social history and family history with the patient and they are unchanged from previous note.  ALLERGIES:  has No Known Allergies.  MEDICATIONS:  Current Outpatient Medications  Medication Sig Dispense Refill  . acetaminophen (TYLENOL) 500 MG tablet Take 1 tablet (500 mg total) by mouth every 6 (six) hours as needed. (Patient taking differently: Take 500 mg by mouth every 6 (six) hours as needed for moderate pain. ) 30 tablet 3  . Calcium Carbonate (CALTRATE 600 PO) Take 1 tablet by mouth 2 (two) times daily.     Marland Kitchen dexamethasone (DECADRON) 4 MG tablet Take 3 tabs the night before and 3 tabs on the morning of chemotherapy every 3 weeks with food 30 tablet 1  . enalapril (VASOTEC) 20 MG tablet Take 1 tablet (20 mg total) by mouth daily. 180 tablet 2  . enoxaparin (LOVENOX) 120 MG/0.8ML injection Inject 0.8 mLs (120 mg total) into the skin every 12 (twelve) hours. 20 Syringe 0  . Fluticasone-Salmeterol (WIXELA INHUB) 100-50 MCG/DOSE AEPB Inhale 1 puff into the lungs daily. 60 each 0  . furosemide (LASIX) 40 MG tablet TAKE 1 TABLET BY MOUTH DAILY AS NEEDED FOR LEG SWELLING 90 tablet 2  . hydrochlorothiazide (HYDRODIURIL) 25 MG tablet TAKE 1 TABLET(25 MG) BY MOUTH DAILY 90 tablet 1  . lidocaine-prilocaine (EMLA) cream Apply to affected area once 30 g 3  . Multiple Vitamin (MULTIVITAMIN) tablet Take 1 tablet by mouth daily.    . Multiple Vitamins-Minerals (ICAPS  AREDS 2 PO) Take 1 capsule by mouth 2 (two) times daily.    . Multiple Vitamins-Minerals (WOMENS BONE HEALTH PO) Take 1 tablet by mouth daily.    . ondansetron (ZOFRAN) 8 MG tablet Take 1 tablet (8 mg total) by mouth every 8 (eight) hours as needed. Start on the third day after chemotherapy. 30 tablet 1  . prochlorperazine (COMPAZINE) 10 MG tablet Take 1 tablet (10 mg total) by mouth every 6 (six) hours as needed (Nausea or vomiting). 30 tablet 1  . rivaroxaban (XARELTO) 20 MG TABS tablet Take 1 tablet (20 mg total) by mouth daily. 90  tablet 0  . senna (SENOKOT) 8.6 MG TABS tablet Take 1 tablet (8.6 mg total) by mouth daily. 120 each 0  . Vitamin D, Cholecalciferol, 1000 units CAPS Take by mouth daily.     No current facility-administered medications for this visit.     PHYSICAL EXAMINATION: ECOG PERFORMANCE STATUS: 2 - Symptomatic, <50% confined to bed  Vitals:   08/05/18 1420  BP: (!) 138/54  Pulse: 86  Resp: 18  Temp: 98.2 F (36.8 C)  SpO2: 100%   Filed Weights   08/05/18 1420  Weight: 268 lb (121.6 kg)    GENERAL:alert, no distress and comfortable. She is obese SKIN: skin color, texture, turgor are normal, no rashes or significant lesions EYES: normal, Conjunctiva are pink and non-injected, sclera clear OROPHARYNX:no exudate, no erythema and lips, buccal mucosa, and tongue normal  NECK: supple, thyroid normal size, non-tender, without nodularity LYMPH:  no palpable lymphadenopathy in the cervical, axillary or inguinal LUNGS: clear to auscultation and percussion with normal breathing effort HEART: regular rate & rhythm and no murmurs with moderate lower extremity edema ABDOMEN:abdomen soft, non-tender and normal bowel sounds Musculoskeletal:no cyanosis of digits and no clubbing  NEURO: alert & oriented x 3 with fluent speech, no focal motor/sensory deficits  LABORATORY DATA:  I have reviewed the data as listed    Component Value Date/Time   NA 136 08/05/2018 1433   NA 142 12/25/2014 1122   K 4.1 08/05/2018 1433   K 4.1 12/25/2014 1122   CL 101 08/05/2018 1433   CL 102 10/04/2012 1159   CO2 27 08/05/2018 1433   CO2 29 12/25/2014 1122   GLUCOSE 94 08/05/2018 1433   GLUCOSE 99 12/25/2014 1122   GLUCOSE 116 (H) 10/04/2012 1159   BUN 16 08/05/2018 1433   BUN 13.7 12/25/2014 1122   CREATININE 1.10 (H) 08/05/2018 1433   CREATININE 0.96 (H) 02/27/2016 1530   CREATININE 0.9 12/25/2014 1122   CALCIUM 10.5 (H) 08/05/2018 1433   CALCIUM 10.1 12/25/2014 1122   PROT 8.7 (H) 08/05/2018 1433   PROT  7.8 12/25/2014 1122   ALBUMIN 3.2 (L) 08/05/2018 1433   ALBUMIN 3.4 (L) 12/25/2014 1122   AST 21 08/05/2018 1433   AST 21 12/25/2014 1122   ALT 9 08/05/2018 1433   ALT 17 12/25/2014 1122   ALKPHOS 77 08/05/2018 1433   ALKPHOS 99 12/25/2014 1122   BILITOT 0.3 08/05/2018 1433   BILITOT 0.41 12/25/2014 1122   GFRNONAA 48 (L) 08/05/2018 1433   GFRNONAA 58 (L) 02/27/2016 1530   GFRAA 55 (L) 08/05/2018 1433   GFRAA 66 02/27/2016 1530    No results found for: SPEP, UPEP  Lab Results  Component Value Date   WBC 9.0 08/05/2018   NEUTROABS 6.5 08/05/2018   HGB 10.1 (L) 08/05/2018   HCT 31.6 (L) 08/05/2018   MCV 91.9 08/05/2018   PLT 348  08/05/2018      Chemistry      Component Value Date/Time   NA 136 08/05/2018 1433   NA 142 12/25/2014 1122   K 4.1 08/05/2018 1433   K 4.1 12/25/2014 1122   CL 101 08/05/2018 1433   CL 102 10/04/2012 1159   CO2 27 08/05/2018 1433   CO2 29 12/25/2014 1122   BUN 16 08/05/2018 1433   BUN 13.7 12/25/2014 1122   CREATININE 1.10 (H) 08/05/2018 1433   CREATININE 0.96 (H) 02/27/2016 1530   CREATININE 0.9 12/25/2014 1122      Component Value Date/Time   CALCIUM 10.5 (H) 08/05/2018 1433   CALCIUM 10.1 12/25/2014 1122   ALKPHOS 77 08/05/2018 1433   ALKPHOS 99 12/25/2014 1122   AST 21 08/05/2018 1433   AST 21 12/25/2014 1122   ALT 9 08/05/2018 1433   ALT 17 12/25/2014 1122   BILITOT 0.3 08/05/2018 1433   BILITOT 0.41 12/25/2014 1122       RADIOGRAPHIC STUDIES: I have personally reviewed the radiological images as listed and agreed with the findings in the report. Ct Chest W Contrast  Result Date: 07/28/2018 CLINICAL DATA:  Recent diagnosis of endometrial cancer, presenting for chest staging. History of left breast cancer. EXAM: CT CHEST WITH CONTRAST TECHNIQUE: Multidetector CT imaging of the chest was performed during intravenous contrast administration. CONTRAST:  48m OMNIPAQUE IOHEXOL 300 MG/ML  SOLN COMPARISON:  03/12/2011 chest CT  angiogram. FINDINGS: Cardiovascular: Top-normal heart size. No significant pericardial effusion/thickening. Three-vessel coronary atherosclerosis. Atherosclerotic nonaneurysmal thoracic aorta. Normal caliber pulmonary arteries. No central pulmonary emboli. Mediastinum/Nodes: Subcentimeter hypodense bilateral thyroid lobe nodules. Unremarkable esophagus. No pathologically enlarged axillary, mediastinal or hilar lymph nodes. Lungs/Pleura: No pneumothorax. No pleural effusion. No acute consolidative airspace disease or lung masses. There are several (at least 10) new solid pulmonary nodules scattered throughout both lungs compared to 03/12/2011 chest CT study, largest 6 mm in the peripheral right upper lobe (series 5/image 62), 6 mm in the peripheral left upper lobe (series 5/image 82) and 4 mm in the posterior left upper lobe (series 5/image 35). Upper abdomen: Hypodense 2.5 cm segment 7 right liver mass (series 2/image 111). Additional scattered subcentimeter hypodense liver lesions are too small to characterize. Partially visualized left para-aortic adenopathy. Simple 2.2 cm medial upper left renal cyst. Musculoskeletal: No aggressive appearing focal osseous lesions. Moderate thoracic spondylosis. IMPRESSION: 1. Several (at least 10) solid pulmonary nodules scattered throughout both lungs are new since 2012 chest CT, likely representing pulmonary metastases, largest 6 mm, below PET resolution. 2. Redemonstration of hypodense 2.5 cm segment 7 right liver lobe mass, indeterminate, suspicious for liver metastasis. 3. 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 4. Three-vessel coronary atherosclerosis. Aortic Atherosclerosis (ICD10-I70.0). Electronically Signed   By: JIlona SorrelM.D.   On: 07/28/2018 11:55    All questions were answered. The patient knows to call the clinic with any problems, questions or concerns. No barriers to learning was detected.  I spent 30 minutes counseling the  patient face to face. The total time spent in the appointment was 40 minutes and more than 50% was on counseling and review of test results  NHeath Lark MD 08/06/2018 8:24 AM

## 2018-08-06 NOTE — Assessment & Plan Note (Signed)
We reviewed the NCCN guidelines We discussed the role of chemotherapy. The intent is of palliative intent.  We discussed some of the risks, benefits, side-effects of carboplatin & Taxol. Treatment is intravenous, every 3 weeks x 6 cycles  Some of the short term side-effects included, though not limited to, including weight loss, life threatening infections, risk of allergic reactions, need for transfusions of blood products, nausea, vomiting, change in bowel habits, loss of hair, admission to hospital for various reasons, and risks of death.   Long term side-effects are also discussed including risks of infertility, permanent damage to nerve function, hearing loss, chronic fatigue, kidney damage with possibility needing hemodialysis, and rare secondary malignancy including bone marrow disorders.  The patient is aware that the response rates discussed earlier is not guaranteed.  After a long discussion, patient made an informed decision to proceed with the prescribed plan of care.   Patient education material was dispensed. We discussed premedication with dexamethasone before chemotherapy. I do not recommend prophylactic G-CSF support Given her age and stage IV at diagnosis, I will reduce carboplatin AUC to 5 and reduce 20% of Taxol

## 2018-08-06 NOTE — Telephone Encounter (Signed)
Per request, fax office note to Dr. Purvis Sheffield office

## 2018-08-06 NOTE — Assessment & Plan Note (Signed)
The patient is edematous on exam I recommend resumption of diuretic therapy and to hold Vasotec

## 2018-08-06 NOTE — Assessment & Plan Note (Signed)
The patient is aware she has incurable disease and treatment is strictly palliative. 

## 2018-08-06 NOTE — Procedures (Signed)
Interventional Radiology Procedure Note  Procedure: Single Lumen Power Port Placement    Access:  Right IJ vein.  Findings: Catheter tip positioned at SVC/RA junction. Port is ready for immediate use.   Complications: None  EBL: < 10 mL  Recommendations:  - Ok to shower in 24 hours - Do not submerge for 7 days - Routine line care   Glenn T. Yamagata, M.D Pager:  [phone]   

## 2018-08-06 NOTE — Assessment & Plan Note (Signed)
She is on chronic anticoagulation therapy due to history of blood clots I will switch her to Lovenox injection in anticipation for port placement She will hold Xarelto for 2 days minimum before port placement, hold Lovenox injection minimum 24 hours before port placement and resume after port placement.

## 2018-08-06 NOTE — H&P (Signed)
Chief Complaint: Patient was seen in consultation today for uterine cancer  Referring Physician(s): [place]  Supervising Physician: Aletta Edouard  Patient Status: [facility] - Out-pt  History of Present Illness: Erin Esparza is a 78 y.o. female with past medical history of clotting disorder, DVT, DM, and breast cancer now presents with uterine cancer.  Patient has discussed her disease with Oncology and wishes to proceed with palliative chemotherapy.  She is now in need of durable venous access. IR consulted for Port-A-Cath placement at the request of Dr. Alvy Bimler.   Patient presents today in her usual state of health.  She has been NPO.  She has appropriately held her blood thinners.   Past Medical History:  Diagnosis Date  . Abdominal pain 10/17/2016  . Arthritis    "both feet" (10/17/2016)  . Asthma   . Breast cancer ([place]) 01/06/12   left breast invasive ductal ca,dcis,ER/PR=+,  . Breast mass in female   . Cataract   . Clotting disorder ([place])   . Diastolic congestive heart failure ([place])   . DVT (deep venous thrombosis) ([place]) 1990's?   LLE  . Family history of breast cancer   . Family history of prostate cancer   . Hypertension   . Insulin resistance   . Pulmonary embolism ([place]) ~ 2012  . Radiation 02/12/12 -03/12/12   left breast, total 50gy  . Right ankle pain   . Shortness of breath   . Venous stasis of lower extremity   . Wears dentures    upper  . Wears glasses   . Wears partial dentures    lower    Past Surgical History:  Procedure Laterality Date  . BREAST BIOPSY Left 11/03/11   10 o'clock=benign breast parenchyma  . BREAST LUMPECTOMY Left 01/06/12    Dr. Erroll Luna    Allergies: Patient has no known allergies.  Medications: Prior to Admission medications   Medication Sig Start Date End Date Taking? Authorizing Provider  acetaminophen (TYLENOL) 500 MG tablet Take 1 tablet (500 mg total) by mouth every 6 (six) hours as needed. Patient taking  differently: Take 500 mg by mouth every 6 (six) hours as needed for moderate pain.  08/25/12   Coral Spikes, DO  Calcium Carbonate (CALTRATE 600 PO) Take 1 tablet by mouth 2 (two) times daily.     [provider]  dexamethasone (DECADRON) 4 MG tablet Take 3 tabs the night before and 3 tabs on the morning of chemotherapy every 3 weeks with food 08/05/18   [name], MD  enalapril (VASOTEC) 20 MG tablet Take 1 tablet (20 mg total) by mouth daily. 08/05/18   [name], MD  enoxaparin (LOVENOX) 120 MG/0.8ML injection Inject 0.8 mLs (120 mg total) into the skin every 12 (twelve) hours. 08/02/18   [name], MD  Fluticasone-Salmeterol (WIXELA INHUB) 100-50 MCG/DOSE AEPB Inhale 1 puff into the lungs daily. 08/03/18   Lovenia Kim, MD  furosemide (LASIX) 40 MG tablet TAKE 1 TABLET BY MOUTH DAILY AS NEEDED FOR LEG SWELLING 07/31/17   Lovenia Kim, MD  hydrochlorothiazide (HYDRODIURIL) 25 MG tablet TAKE 1 TABLET(25 MG) BY MOUTH DAILY 08/02/18   Lovenia Kim, MD  lidocaine-prilocaine (EMLA) cream Apply to affected area once 08/05/18   [name], MD  Multiple Vitamin (MULTIVITAMIN) tablet Take 1 tablet by mouth daily.    [provider]  Multiple Vitamins-Minerals (ICAPS AREDS 2 PO) Take 1 capsule by mouth 2 (two) times daily.    [provider]  Multiple  Vitamins-Minerals (WOMENS BONE HEALTH PO) Take 1 tablet by mouth daily.    [provider]  ondansetron (ZOFRAN) 8 MG tablet Take 1 tablet (8 mg total) by mouth every 8 (eight) hours as needed. Start on the third day after chemotherapy. 08/05/18   [name], MD  prochlorperazine (COMPAZINE) 10 MG tablet Take 1 tablet (10 mg total) by mouth every 6 (six) hours as needed (Nausea or vomiting). 08/05/18   [name], MD  rivaroxaban (XARELTO) 20 MG TABS tablet Take 1 tablet (20 mg total) by mouth daily. 10/28/17   Lovenia Kim, MD  senna (SENOKOT) 8.6 MG TABS tablet Take 1 tablet (8.6 mg total) by mouth daily. 01/20/18   McDiarmid,  Blane Ohara, MD  Vitamin D, Cholecalciferol, 1000 units CAPS Take by mouth daily.    [provider]     Family History  Problem Relation Age of Onset  . Heart disease Mother   . Heart attack Mother   . Breast cancer Sister 88  . Parkinson's disease Brother   . Prostate cancer Brother   . Pulmonary embolism Daughter   . Diabetes Daughter   . Pulmonary embolism Daughter   . Breast cancer Sister   . Other Brother        MVA  . Other Brother        MVA  . COPD Brother   . Breast cancer Niece        dx 67s    Social History   Socioeconomic History  . Marital status: Widowed    Spouse name: Not on file  . Number of children: 5  . Years of education: 79  . Highest education level: Associate degree: occupational, Hotel manager, or vocational program  Occupational History  . Occupation: Retired    Fish farm manager: RETIRED  Social Needs  . Financial resource strain: Not hard at all  . Food insecurity:    Worry: Never true    Inability: Never true  . Transportation needs:    Medical: No    Non-medical: No  Tobacco Use  . Smoking status: Former Smoker    Packs/day: 0.30    Years: 15.00    Pack years: 4.50    Types: Cigarettes  . Smokeless tobacco: Never Used  . Tobacco comment: "stopped in the 1990s"  Substance and Sexual Activity  . Alcohol use: No  . Drug use: No  . Sexual activity: Not Currently  Lifestyle  . Physical activity:    Days per week: 2 days    Minutes per session: 20 min  . Stress: Not at all  Relationships  . Social connections:    Talks on phone: More than three times a week    Gets together: More than three times a week    Attends religious service: More than 4 times per year    Active member of club or organization: No    Attends meetings of clubs or organizations: Never    Relationship status: Widowed  Other Topics Concern  . Not on file  Social History Narrative      Emergency Contact: daughter, Freddie Breech (c) [phone] or Arnette Schaumann (Loletha Grayer)  [phone]   Who lives with you: daughter - Arnette Schaumann and granddaughter   Lives in one level house. Has grab bars in bathroom at tub. No throw rugs. Smoke alarms   Any pets: 2 dogs, pit bull and fox terrier   Diet: Pt has a varied diet. Does not eat cheese, eats meat, fruit, and vegetables. Drinks tea, water,  occasional soda.   Exercise: Pt regularly does low impact aerobic exercises with TV program, walks, stretches, and occasional water aerobics.   Seatbelts: Pt reports wearing seatbelt when in vehicles.    Hobbies: Bingo        Review of Systems: A 12 point ROS discussed and pertinent positives are indicated in the HPI above.  All other systems are negative.  Review of Systems  Constitutional: Negative for fatigue and fever.  Respiratory: Negative for cough and shortness of breath.   Cardiovascular: Negative for chest pain.  Gastrointestinal: Negative for abdominal pain, blood in stool, diarrhea and vomiting.  Genitourinary: Negative for dysuria and vaginal bleeding.  Musculoskeletal: Negative for back pain.  Psychiatric/Behavioral: Negative for behavioral problems and confusion.    Vital Signs: BP 122/80 (BP Location: Right Arm)   Pulse (!) 110   Temp 98 F (36.7 C) (Oral)   Resp 18   SpO2 100%   Physical Exam Vitals signs and nursing note reviewed.  Constitutional:      Appearance: Normal appearance.  HENT:     Mouth/Throat:     Mouth: Mucous membranes are moist.     Comments: Tongue with white plaque, patient states is normal for her Neck:     Musculoskeletal: Normal range of motion and neck supple. No neck rigidity or muscular tenderness.  Cardiovascular:     Rate and Rhythm: Normal rate and regular rhythm.     Heart sounds: No murmur. No friction rub. No gallop.   Pulmonary:     Effort: Pulmonary effort is normal. No respiratory distress.     Breath sounds: Normal breath sounds.  Neurological:     General: No focal deficit present.     Mental Status: She is  alert and oriented to person, place, and time. Mental status is at baseline.  Psychiatric:        Mood and Affect: Mood normal.        Behavior: Behavior normal.        Thought Content: Thought content normal.        Judgment: Judgment normal.      MD Evaluation Airway: WNL Heart: WNL Abdomen: WNL Chest/ Lungs: WNL ASA  Classification: 3 Mallampati/Airway Score: Two   Imaging: Ct Chest W Contrast  Result Date: 07/28/2018 CLINICAL DATA:  Recent diagnosis of endometrial cancer, presenting for chest staging. History of left breast cancer. EXAM: CT CHEST WITH CONTRAST TECHNIQUE: Multidetector CT imaging of the chest was performed during intravenous contrast administration. CONTRAST:  34mL OMNIPAQUE IOHEXOL 300 MG/ML  SOLN COMPARISON:  03/12/2011 chest CT angiogram. FINDINGS: Cardiovascular: Top-normal heart size. No significant pericardial effusion/thickening. Three-vessel coronary atherosclerosis. Atherosclerotic nonaneurysmal thoracic aorta. Normal caliber pulmonary arteries. No central pulmonary emboli. Mediastinum/Nodes: Subcentimeter hypodense bilateral thyroid lobe nodules. Unremarkable esophagus. No pathologically enlarged axillary, mediastinal or hilar lymph nodes. Lungs/Pleura: No pneumothorax. No pleural effusion. No acute consolidative airspace disease or lung masses. There are several (at least 10) new solid pulmonary nodules scattered throughout both lungs compared to 03/12/2011 chest CT study, largest 6 mm in the peripheral right upper lobe (series 5/image 62), 6 mm in the peripheral left upper lobe (series 5/image 82) and 4 mm in the posterior left upper lobe (series 5/image 35). Upper abdomen: Hypodense 2.5 cm segment 7 right liver mass (series 2/image 111). Additional scattered subcentimeter hypodense liver lesions are too small to characterize. Partially visualized left para-aortic adenopathy. Simple 2.2 cm medial upper left renal cyst. Musculoskeletal: No aggressive appearing  focal osseous lesions. Moderate  thoracic spondylosis. IMPRESSION: 1. Several (at least 10) solid pulmonary nodules scattered throughout both lungs are new since 2012 chest CT, likely representing pulmonary metastases, largest 6 mm, below PET resolution. 2. Redemonstration of hypodense 2.5 cm segment 7 right liver lobe mass, indeterminate, suspicious for liver metastasis. 3. 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 4. Three-vessel coronary atherosclerosis. Aortic Atherosclerosis (ICD10-I70.0). Electronically Signed   By: Ilona Sorrel M.D.   On: 07/28/2018 11:55    Labs:  CBC: Recent Labs    07/26/18 1342 08/05/18 1433  WBC 8.3 9.0  HGB 10.4* 10.1*  HCT 33.1* 31.6*  PLT 283 348    COAGS: No results for input(s): INR, APTT in the last 8760 hours.  BMP: Recent Labs    07/26/18 1342 08/05/18 1433  NA 139 136  K 4.0 4.1  CL 103 101  CO2 25 27  GLUCOSE 96 94  BUN 19 16  CALCIUM 9.8 10.5*  CREATININE 1.15* 1.10*  GFRNONAA 45* 48*  GFRAA 52* 55*    LIVER FUNCTION TESTS: Recent Labs    07/26/18 1342 08/05/18 1433  BILITOT 0.3 0.3  AST 20 21  ALT 8 9  ALKPHOS 84 77  PROT 8.5* 8.7*  ALBUMIN 3.5 3.2*    TUMOR MARKERS: No results for input(s): AFPTM, CEA, CA199, CHROMGRNA in the last 8760 hours.  Assessment and Plan: Patient with past medical history of breast cancer presents with new diagnosis of uterine cancer.  She has plans for upcoming chemotherapy.  IR consulted for Port-A-Cath placement at the request of Dr. Alvy Bimler. Patient presents today in their usual state of health.  She has been NPO and is not currently on blood thinners as she has appropriately held her lovenox.   Risks and benefits of image guided port-a-catheter placement was discussed with the patient including, but not limited to bleeding, infection, pneumothorax, or fibrin sheath development and need for additional procedures.  All of the patient's questions were answered,  patient is agreeable to proceed. Consent signed and in chart.  Thank you for this interesting consult.  I greatly enjoyed meeting Erin Esparza and look forward to participating in their care.  A copy of this report was sent to the requesting provider on this date.  Electronically Signed: Docia Barrier, PA 08/06/2018, 12:57 PM   I spent a total of  30 Minutes   in face to face in clinical consultation, greater than 50% of which was counseling/coordinating care for uterine cancer

## 2018-08-06 NOTE — Assessment & Plan Note (Signed)
She was treated for breast cancer several years ago.  She has seen Dietitian for genetic testing

## 2018-08-09 ENCOUNTER — Telehealth: Payer: Self-pay | Admitting: Licensed Clinical Social Worker

## 2018-08-09 NOTE — Telephone Encounter (Signed)
Called Erin Esparza to confirm that it is okay to add on her genetics lab to her 09/01/2018 lab appointment. She let me know that this is fine.

## 2018-08-11 ENCOUNTER — Encounter: Payer: Self-pay | Admitting: Oncology

## 2018-08-11 ENCOUNTER — Other Ambulatory Visit: Payer: Self-pay

## 2018-08-11 ENCOUNTER — Inpatient Hospital Stay: Payer: Medicare Other

## 2018-08-11 VITALS — BP 139/64 | HR 80 | Temp 98.0°F | Resp 18

## 2018-08-11 DIAGNOSIS — Z853 Personal history of malignant neoplasm of breast: Secondary | ICD-10-CM | POA: Diagnosis not present

## 2018-08-11 DIAGNOSIS — Z7901 Long term (current) use of anticoagulants: Secondary | ICD-10-CM | POA: Diagnosis not present

## 2018-08-11 DIAGNOSIS — Z86718 Personal history of other venous thrombosis and embolism: Secondary | ICD-10-CM | POA: Diagnosis not present

## 2018-08-11 DIAGNOSIS — I10 Essential (primary) hypertension: Secondary | ICD-10-CM | POA: Diagnosis not present

## 2018-08-11 DIAGNOSIS — Z5111 Encounter for antineoplastic chemotherapy: Secondary | ICD-10-CM | POA: Diagnosis not present

## 2018-08-11 DIAGNOSIS — C55 Malignant neoplasm of uterus, part unspecified: Secondary | ICD-10-CM

## 2018-08-11 MED ORDER — PALONOSETRON HCL INJECTION 0.25 MG/5ML
0.2500 mg | Freq: Once | INTRAVENOUS | Status: AC
  Administered 2018-08-11: 0.25 mg via INTRAVENOUS

## 2018-08-11 MED ORDER — DIPHENHYDRAMINE HCL 50 MG/ML IJ SOLN
INTRAMUSCULAR | Status: AC
  Filled 2018-08-11: qty 1

## 2018-08-11 MED ORDER — SODIUM CHLORIDE 0.9% FLUSH
10.0000 mL | INTRAVENOUS | Status: DC | PRN
  Administered 2018-08-11: 10 mL
  Filled 2018-08-11: qty 10

## 2018-08-11 MED ORDER — DEXAMETHASONE SODIUM PHOSPHATE 10 MG/ML IJ SOLN
INTRAMUSCULAR | Status: AC
  Filled 2018-08-11: qty 1

## 2018-08-11 MED ORDER — FAMOTIDINE IN NACL 20-0.9 MG/50ML-% IV SOLN: 20.0000 mg | Freq: Once | INTRAVENOUS | Status: DC

## 2018-08-11 MED ORDER — SODIUM CHLORIDE 0.9 % IV SOLN
Freq: Once | INTRAVENOUS | Status: AC
  Administered 2018-08-11: 10:00:00 via INTRAVENOUS
  Filled 2018-08-11: qty 5

## 2018-08-11 MED ORDER — SODIUM CHLORIDE 0.9 % IV SOLN
20.0000 mg | Freq: Once | INTRAVENOUS | Status: AC
  Administered 2018-08-11: 20 mg via INTRAVENOUS
  Filled 2018-08-11: qty 2

## 2018-08-11 MED ORDER — DIPHENHYDRAMINE HCL 50 MG/ML IJ SOLN
50.0000 mg | Freq: Once | INTRAMUSCULAR | Status: AC
  Administered 2018-08-11: 50 mg via INTRAVENOUS

## 2018-08-11 MED ORDER — PALONOSETRON HCL INJECTION 0.25 MG/5ML
INTRAVENOUS | Status: AC
  Filled 2018-08-11: qty 5

## 2018-08-11 MED ORDER — SODIUM CHLORIDE 0.9 % IV SOLN
140.0000 mg/m2 | Freq: Once | INTRAVENOUS | Status: AC
  Administered 2018-08-11: 336 mg via INTRAVENOUS
  Filled 2018-08-11: qty 56

## 2018-08-11 MED ORDER — SODIUM CHLORIDE 0.9 % IV SOLN
524.0000 mg | Freq: Once | INTRAVENOUS | Status: AC
  Administered 2018-08-11: 520 mg via INTRAVENOUS
  Filled 2018-08-11: qty 52

## 2018-08-11 MED ORDER — SODIUM CHLORIDE 0.9 % IV SOLN
Freq: Once | INTRAVENOUS | Status: AC
  Administered 2018-08-11: 09:00:00 via INTRAVENOUS
  Filled 2018-08-11: qty 250

## 2018-08-11 MED ORDER — HEPARIN SOD (PORK) LOCK FLUSH 100 UNIT/ML IV SOLN
500.0000 [IU] | Freq: Once | INTRAVENOUS | Status: AC | PRN
  Administered 2018-08-11: 500 [IU]
  Filled 2018-08-11: qty 5

## 2018-08-11 NOTE — Patient Instructions (Signed)
[facility] Discharge Instructions for Patients Receiving Chemotherapy  Today you received the following chemotherapy agents Taxol; Carboplatin  To help prevent nausea and vomiting after your treatment, we encourage you to take your nausea medication as directed   If you develop nausea and vomiting that is not controlled by your nausea medication, call the clinic.   BELOW ARE SYMPTOMS THAT SHOULD BE REPORTED IMMEDIATELY:  *FEVER GREATER THAN 100.5 F  *CHILLS WITH OR WITHOUT FEVER  NAUSEA AND VOMITING THAT IS NOT CONTROLLED WITH YOUR NAUSEA MEDICATION  *UNUSUAL SHORTNESS OF BREATH  *UNUSUAL BRUISING OR BLEEDING  TENDERNESS IN MOUTH AND THROAT WITH OR WITHOUT PRESENCE OF ULCERS  *URINARY PROBLEMS  *BOWEL PROBLEMS  UNUSUAL RASH Items with * indicate a potential emergency and should be followed up as soon as possible.  Feel free to call the clinic should you have any questions or concerns. The clinic phone number is [phone].  Please show the CHEMO ALERT CARD at check-in to the Emergency Department and triage nurse.  Paclitaxel injection What is this medicine? PACLITAXEL (PAK li TAX el) is a chemotherapy drug. It targets fast dividing cells, like cancer cells, and causes these cells to die. This medicine is used to treat ovarian cancer, breast cancer, lung cancer, Kaposi's sarcoma, and other cancers. This medicine may be used for other purposes; ask your health care provider or pharmacist if you have questions. COMMON BRAND NAME(S): Onxol, Taxol What should I tell my health care provider before I take this medicine? They need to know if you have any of these conditions: -history of irregular heartbeat -liver disease -low blood counts, like low white cell, platelet, or red cell counts -lung or breathing disease, like asthma -tingling of the fingers or toes, or other nerve disorder -an unusual or allergic reaction to paclitaxel, alcohol, polyoxyethylated  castor oil, other chemotherapy, other medicines, foods, dyes, or preservatives -pregnant or trying to get pregnant -breast-feeding How should I use this medicine? This drug is given as an infusion into a vein. It is administered in a hospital or clinic by a specially trained health care professional. Talk to your pediatrician regarding the use of this medicine in children. Special care may be needed. Overdosage: If you think you have taken too much of this medicine contact a poison control center or emergency room at once. NOTE: This medicine is only for you. Do not share this medicine with others. What if I miss a dose? It is important not to miss your dose. Call your doctor or health care professional if you are unable to keep an appointment. What may interact with this medicine? Do not take this medicine with any of the following medications: -disulfiram -metronidazole This medicine may also interact with the following medications: -antiviral medicines for hepatitis, HIV or AIDS -certain antibiotics like erythromycin and clarithromycin -certain medicines for fungal infections like ketoconazole and itraconazole -certain medicines for seizures like carbamazepine, phenobarbital, phenytoin -gemfibrozil -nefazodone -rifampin -St. John's wort This list may not describe all possible interactions. Give your health care provider a list of all the medicines, herbs, non-prescription drugs, or dietary supplements you use. Also tell them if you smoke, drink alcohol, or use illegal drugs. Some items may interact with your medicine. What should I watch for while using this medicine? Your condition will be monitored carefully while you are receiving this medicine. You will need important blood work done while you are taking this medicine. This medicine can cause serious allergic reactions. To reduce your   risk you will need to take other medicine(s) before treatment with this medicine. If you experience  allergic reactions like skin rash, itching or hives, swelling of the face, lips, or tongue, tell your doctor or health care professional right away. In some cases, you may be given additional medicines to help with side effects. Follow all directions for their use. This drug may make you feel generally unwell. This is not uncommon, as chemotherapy can affect healthy cells as well as cancer cells. Report any side effects. Continue your course of treatment even though you feel ill unless your doctor tells you to stop. Call your doctor or health care professional for advice if you get a fever, chills or sore throat, or other symptoms of a cold or flu. Do not treat yourself. This drug decreases your body's ability to fight infections. Try to avoid being around people who are sick. This medicine may increase your risk to bruise or bleed. Call your doctor or health care professional if you notice any unusual bleeding. Be careful brushing and flossing your teeth or using a toothpick because you may get an infection or bleed more easily. If you have any dental work done, tell your dentist you are receiving this medicine. Avoid taking products that contain aspirin, acetaminophen, ibuprofen, naproxen, or ketoprofen unless instructed by your doctor. These medicines may hide a fever. Do not become pregnant while taking this medicine. Women should inform their doctor if they wish to become pregnant or think they might be pregnant. There is a potential for serious side effects to an unborn child. Talk to your health care professional or pharmacist for more information. Do not breast-feed an infant while taking this medicine. Men are advised not to father a child while receiving this medicine. This product may contain alcohol. Ask your pharmacist or healthcare provider if this medicine contains alcohol. Be sure to tell all healthcare providers you are taking this medicine. Certain medicines, like metronidazole and  disulfiram, can cause an unpleasant reaction when taken with alcohol. The reaction includes flushing, headache, nausea, vomiting, sweating, and increased thirst. The reaction can last from 30 minutes to several hours. What side effects may I notice from receiving this medicine? Side effects that you should report to your doctor or health care professional as soon as possible: -allergic reactions like skin rash, itching or hives, swelling of the face, lips, or tongue -breathing problems -changes in vision -fast, irregular heartbeat -high or low blood pressure -mouth sores -pain, tingling, numbness in the hands or feet -signs of decreased platelets or bleeding - bruising, pinpoint red spots on the skin, black, tarry stools, blood in the urine -signs of decreased red blood cells - unusually weak or tired, feeling faint or lightheaded, falls -signs of infection - fever or chills, cough, sore throat, pain or difficulty passing urine -signs and symptoms of liver injury like dark yellow or brown urine; general ill feeling or flu-like symptoms; light-colored stools; loss of appetite; nausea; right upper belly pain; unusually weak or tired; yellowing of the eyes or skin -swelling of the ankles, feet, hands -unusually slow heartbeat Side effects that usually do not require medical attention (report to your doctor or health care professional if they continue or are bothersome): -diarrhea -hair loss -loss of appetite -muscle or joint pain -nausea, vomiting -pain, redness, or irritation at site where injected -tiredness This list may not describe all possible side effects. Call your doctor for medical advice about side effects. You may report side effects to  FDA at 1-800-FDA-1088. Where should I keep my medicine? This drug is given in a hospital or clinic and will not be stored at home. NOTE: This sheet is a summary. It may not cover all possible information. If you have questions about this medicine,  talk to your doctor, pharmacist, or health care provider.  2019 [employer] Standard (2017-01-20 13:14:55)  Carboplatin injection What is this medicine? CARBOPLATIN (KAR boe pla tin) is a chemotherapy drug. It targets fast dividing cells, like cancer cells, and causes these cells to die. This medicine is used to treat ovarian cancer and many other cancers. This medicine may be used for other purposes; ask your health care provider or pharmacist if you have questions. COMMON BRAND NAME(S): Paraplatin What should I tell my health care provider before I take this medicine? They need to know if you have any of these conditions: -blood disorders -hearing problems -kidney disease -recent or ongoing radiation therapy -an unusual or allergic reaction to carboplatin, cisplatin, other chemotherapy, other medicines, foods, dyes, or preservatives -pregnant or trying to get pregnant -breast-feeding How should I use this medicine? This drug is usually given as an infusion into a vein. It is administered in a hospital or clinic by a specially trained health care professional. Talk to your pediatrician regarding the use of this medicine in children. Special care may be needed. Overdosage: If you think you have taken too much of this medicine contact a poison control center or emergency room at once. NOTE: This medicine is only for you. Do not share this medicine with others. What if I miss a dose? It is important not to miss a dose. Call your doctor or health care professional if you are unable to keep an appointment. What may interact with this medicine? -medicines for seizures -medicines to increase blood counts like filgrastim, pegfilgrastim, sargramostim -some antibiotics like amikacin, gentamicin, neomycin, streptomycin, tobramycin -vaccines Talk to your doctor or health care professional before taking any of these medicines: -acetaminophen -aspirin -ibuprofen -ketoprofen -naproxen This  list may not describe all possible interactions. Give your health care provider a list of all the medicines, herbs, non-prescription drugs, or dietary supplements you use. Also tell them if you smoke, drink alcohol, or use illegal drugs. Some items may interact with your medicine. What should I watch for while using this medicine? Your condition will be monitored carefully while you are receiving this medicine. You will need important blood work done while you are taking this medicine. This drug may make you feel generally unwell. This is not uncommon, as chemotherapy can affect healthy cells as well as cancer cells. Report any side effects. Continue your course of treatment even though you feel ill unless your doctor tells you to stop. In some cases, you may be given additional medicines to help with side effects. Follow all directions for their use. Call your doctor or health care professional for advice if you get a fever, chills or sore throat, or other symptoms of a cold or flu. Do not treat yourself. This drug decreases your body's ability to fight infections. Try to avoid being around people who are sick. This medicine may increase your risk to bruise or bleed. Call your doctor or health care professional if you notice any unusual bleeding. Be careful brushing and flossing your teeth or using a toothpick because you may get an infection or bleed more easily. If you have any dental work done, tell your dentist you are receiving this medicine. Avoid taking products that  that contain aspirin, acetaminophen, ibuprofen, naproxen, or ketoprofen unless instructed by your doctor. These medicines may hide a fever. Do not become pregnant while taking this medicine. Women should inform their doctor if they wish to become pregnant or think they might be pregnant. There is a potential for serious side effects to an unborn child. Talk to your health care professional or pharmacist for more information. Do not  breast-feed an infant while taking this medicine. What side effects may I notice from receiving this medicine? Side effects that you should report to your doctor or health care professional as soon as possible: -allergic reactions like skin rash, itching or hives, swelling of the face, lips, or tongue -signs of infection - fever or chills, cough, sore throat, pain or difficulty passing urine -signs of decreased platelets or bleeding - bruising, pinpoint red spots on the skin, black, tarry stools, nosebleeds -signs of decreased red blood cells - unusually weak or tired, fainting spells, lightheadedness -breathing problems -changes in hearing -changes in vision -chest pain -high blood pressure -low blood counts - This drug may decrease the number of white blood cells, red blood cells and platelets. You may be at increased risk for infections and bleeding. -nausea and vomiting -pain, swelling, redness or irritation at the injection site -pain, tingling, numbness in the hands or feet -problems with balance, talking, walking -trouble passing urine or change in the amount of urine Side effects that usually do not require medical attention (report to your doctor or health care professional if they continue or are bothersome): -hair loss -loss of appetite -metallic taste in the mouth or changes in taste This list may not describe all possible side effects. Call your doctor for medical advice about side effects. You may report side effects to FDA at 1-800-FDA-1088. Where should I keep my medicine? This drug is given in a hospital or clinic and will not be stored at home. NOTE: This sheet is a summary. It may not cover all possible information. If you have questions about this medicine, talk to your doctor, pharmacist, or health care provider.  2019 [employer]/Gold Standard (2007-08-24 14:38:05)    

## 2018-08-16 ENCOUNTER — Telehealth: Payer: Self-pay | Admitting: Oncology

## 2018-08-16 NOTE — Telephone Encounter (Signed)
[name] called and said Erin Esparza is feeling well after chemotherapy on Wednesday except for reduced appetite while taking Zofran.  [name] is not having any nausea and is wondering if she needs to be taking the nausea medication.  Advised her that she does not need to take it if she is not having nausea.  Also advised her that she can take either compazine or Zofran if she starts to have nausea.  She verbalized agreement.

## 2018-08-27 ENCOUNTER — Telehealth: Payer: Self-pay

## 2018-08-27 NOTE — Telephone Encounter (Signed)
Pt called reporting pain and swelling in left foot/heel area.  She has been using ice and Tylenol.  This helps but not a lot.  Pt states she had been getting injections for this but Dr Alvy Bimler did not want her to get anymore injections. She wants to know what else can be done to relieve pain. Per review of chart pt received injection for gout in the right foot previously.  Pt educated on interventions to relieve gout pain - ice, NSAIDS, elevation, avoid certain food.  Pt also instructed to call her podiatrist to see what else they recommend that does not involve her getting an injection.  Pt verbalizes understanding.  Pt states she has an appt to see Dr Alvy Bimler on 4/1.  Pt instructed to call back on Monday if she has anymore issues that cannot wait until 4/1.

## 2018-09-01 ENCOUNTER — Encounter: Payer: Self-pay | Admitting: Hematology and Oncology

## 2018-09-01 ENCOUNTER — Inpatient Hospital Stay: Payer: Medicare Other | Attending: Gynecologic Oncology

## 2018-09-01 ENCOUNTER — Telehealth: Payer: Self-pay | Admitting: Hematology and Oncology

## 2018-09-01 ENCOUNTER — Other Ambulatory Visit: Payer: Self-pay

## 2018-09-01 ENCOUNTER — Inpatient Hospital Stay: Payer: Medicare Other

## 2018-09-01 ENCOUNTER — Inpatient Hospital Stay (HOSPITAL_BASED_OUTPATIENT_CLINIC_OR_DEPARTMENT_OTHER): Payer: Medicare Other | Admitting: Hematology and Oncology

## 2018-09-01 VITALS — HR 86

## 2018-09-01 DIAGNOSIS — T451X5A Adverse effect of antineoplastic and immunosuppressive drugs, initial encounter: Secondary | ICD-10-CM

## 2018-09-01 DIAGNOSIS — N183 Chronic kidney disease, stage 3 (moderate): Secondary | ICD-10-CM | POA: Diagnosis not present

## 2018-09-01 DIAGNOSIS — D61810 Antineoplastic chemotherapy induced pancytopenia: Secondary | ICD-10-CM | POA: Insufficient documentation

## 2018-09-01 DIAGNOSIS — Z5111 Encounter for antineoplastic chemotherapy: Secondary | ICD-10-CM | POA: Insufficient documentation

## 2018-09-01 DIAGNOSIS — D6481 Anemia due to antineoplastic chemotherapy: Secondary | ICD-10-CM | POA: Insufficient documentation

## 2018-09-01 DIAGNOSIS — K5909 Other constipation: Secondary | ICD-10-CM | POA: Diagnosis not present

## 2018-09-01 DIAGNOSIS — Z853 Personal history of malignant neoplasm of breast: Secondary | ICD-10-CM | POA: Diagnosis not present

## 2018-09-01 DIAGNOSIS — I129 Hypertensive chronic kidney disease with stage 1 through stage 4 chronic kidney disease, or unspecified chronic kidney disease: Secondary | ICD-10-CM | POA: Insufficient documentation

## 2018-09-01 DIAGNOSIS — C55 Malignant neoplasm of uterus, part unspecified: Secondary | ICD-10-CM

## 2018-09-01 DIAGNOSIS — Z803 Family history of malignant neoplasm of breast: Secondary | ICD-10-CM | POA: Diagnosis not present

## 2018-09-01 DIAGNOSIS — C7800 Secondary malignant neoplasm of unspecified lung: Secondary | ICD-10-CM | POA: Diagnosis not present

## 2018-09-01 DIAGNOSIS — I10 Essential (primary) hypertension: Secondary | ICD-10-CM | POA: Insufficient documentation

## 2018-09-01 LAB — COMPREHENSIVE METABOLIC PANEL
ALT: 11 U/L (ref 0–44)
AST: 17 U/L (ref 15–41)
Albumin: 3.3 g/dL — ABNORMAL LOW (ref 3.5–5.0)
Alkaline Phosphatase: 77 U/L (ref 38–126)
Anion gap: 14 (ref 5–15)
BUN: 18 mg/dL (ref 8–23)
CO2: 23 mmol/L (ref 22–32)
Calcium: 9.9 mg/dL (ref 8.9–10.3)
Chloride: 102 mmol/L (ref 98–111)
Creatinine, Ser: 1 mg/dL (ref 0.44–1.00)
GFR calc Af Amer: >60 mL/min (ref >60)
GFR calc non Af Amer: 54 mL/min — ABNORMAL LOW (ref >60)
Glucose, Bld: 166 mg/dL — ABNORMAL HIGH (ref 70–99)
Potassium: 3.7 mmol/L (ref 3.5–5.1)
Sodium: 139 mmol/L (ref 135–145)
Total Bilirubin: 0.2 mg/dL — ABNORMAL LOW (ref 0.3–1.2)
Total Protein: 8 g/dL (ref 6.5–8.1)

## 2018-09-01 LAB — CBC WITH DIFFERENTIAL/PLATELET
Abs Immature Granulocytes: 0.08 10*3/uL — ABNORMAL HIGH (ref 0.00–0.07)
Basophils Absolute: 0 10*3/uL (ref 0.0–0.1)
Basophils Relative: 0 %
Eosinophils Absolute: 0 10*3/uL (ref 0.0–0.5)
Eosinophils Relative: 0 %
HCT: 30.4 % — ABNORMAL LOW (ref 36.0–46.0)
Hemoglobin: 9.8 g/dL — ABNORMAL LOW (ref 12.0–15.0)
Immature Granulocytes: 1 %
Lymphocytes Relative: 5 %
Lymphs Abs: 0.5 10*3/uL — ABNORMAL LOW (ref 0.7–4.0)
MCH: 29.9 pg (ref 26.0–34.0)
MCHC: 32.2 g/dL (ref 30.0–36.0)
MCV: 92.7 fL (ref 80.0–100.0)
Monocytes Absolute: 0.2 10*3/uL (ref 0.1–1.0)
Monocytes Relative: 2 %
Neutro Abs: 9.3 10*3/uL — ABNORMAL HIGH (ref 1.7–7.7)
Neutrophils Relative %: 92 %
Platelets: 177 10*3/uL (ref 150–400)
RBC: 3.28 MIL/uL — ABNORMAL LOW (ref 3.87–5.11)
RDW: 14.2 % (ref 11.5–15.5)
WBC: 10.1 10*3/uL (ref 4.0–10.5)
nRBC: 0 % (ref 0.0–0.2)

## 2018-09-01 MED ORDER — SODIUM CHLORIDE 0.9 % IV SOLN
Freq: Once | INTRAVENOUS | Status: AC
  Administered 2018-09-01: 10:00:00 via INTRAVENOUS
  Filled 2018-09-01: qty 250

## 2018-09-01 MED ORDER — SODIUM CHLORIDE 0.9 % IV SOLN
20.0000 mg | Freq: Once | INTRAVENOUS | Status: AC
  Administered 2018-09-01: 20 mg via INTRAVENOUS
  Filled 2018-09-01: qty 2

## 2018-09-01 MED ORDER — FAMOTIDINE IN NACL 20-0.9 MG/50ML-% IV SOLN: 20.0000 mg | Freq: Once | INTRAVENOUS | Status: DC

## 2018-09-01 MED ORDER — DIPHENHYDRAMINE HCL 50 MG/ML IJ SOLN
INTRAMUSCULAR | Status: AC
  Filled 2018-09-01: qty 1

## 2018-09-01 MED ORDER — SODIUM CHLORIDE 0.9% FLUSH
10.0000 mL | Freq: Once | INTRAVENOUS | Status: AC
  Administered 2018-09-01: 10 mL
  Filled 2018-09-01: qty 10

## 2018-09-01 MED ORDER — PALONOSETRON HCL INJECTION 0.25 MG/5ML
0.2500 mg | Freq: Once | INTRAVENOUS | Status: AC
  Administered 2018-09-01: 0.25 mg via INTRAVENOUS

## 2018-09-01 MED ORDER — HEPARIN SOD (PORK) LOCK FLUSH 100 UNIT/ML IV SOLN
500.0000 [IU] | Freq: Once | INTRAVENOUS | Status: AC | PRN
  Administered 2018-09-01: 500 [IU]
  Filled 2018-09-01: qty 5

## 2018-09-01 MED ORDER — SODIUM CHLORIDE 0.9 % IV SOLN
Freq: Once | INTRAVENOUS | Status: AC
  Administered 2018-09-01: 11:00:00 via INTRAVENOUS
  Filled 2018-09-01: qty 5

## 2018-09-01 MED ORDER — DIPHENHYDRAMINE HCL 50 MG/ML IJ SOLN
50.0000 mg | Freq: Once | INTRAMUSCULAR | Status: AC
  Administered 2018-09-01: 50 mg via INTRAVENOUS

## 2018-09-01 MED ORDER — SODIUM CHLORIDE 0.9 % IV SOLN
564.0000 mg | Freq: Once | INTRAVENOUS | Status: AC
  Administered 2018-09-01: 560 mg via INTRAVENOUS
  Filled 2018-09-01: qty 56

## 2018-09-01 MED ORDER — SODIUM CHLORIDE 0.9% FLUSH
10.0000 mL | INTRAVENOUS | Status: DC | PRN
  Administered 2018-09-01: 10 mL
  Filled 2018-09-01: qty 10

## 2018-09-01 MED ORDER — SODIUM CHLORIDE 0.9 % IV SOLN
140.0000 mg/m2 | Freq: Once | INTRAVENOUS | Status: AC
  Administered 2018-09-01: 336 mg via INTRAVENOUS
  Filled 2018-09-01: qty 56

## 2018-09-01 MED ORDER — PALONOSETRON HCL INJECTION 0.25 MG/5ML
INTRAVENOUS | Status: AC
  Filled 2018-09-01: qty 5

## 2018-09-01 NOTE — Assessment & Plan Note (Signed)

## 2018-09-01 NOTE — Progress Notes (Signed)
Met with patient at registration to introduce myself as Financial Resource Specialist and to offer available resources. ° °Discussed one-time $1000 [employer] grant and qualifications to assist with personal expenses while going through treatment. ° °Gave her my card if interested in applying and for any additional financial questions or concerns. °

## 2018-09-01 NOTE — Progress Notes (Signed)
[place] OFFICE PROGRESS NOTE  Patient Care Team: Lovenia Kim, MD as PCP - General [name], MD as Consulting Physician (Ophthalmology) [name], DPM as Consulting Physician (Podiatry) [name], [name], MD as Referring Physician (Optometry) [name], MD as Consulting Physician (Orthopedic Surgery) [name], RN as Oncology Nurse Navigator (Oncology)  ASSESSMENT & PLAN:  Uterine cancer [facility]) She tolerated chemo well except for mild anemia, mild flare of joint pain and some constipation We will continue without dose adjustment  Essential hypertension Her blood pressure is intermittently elevated She is not symptomatic Observe only  Anemia due to antineoplastic chemotherapy This is likely due to recent treatment. The patient denies recent history of bleeding such as epistaxis, hematuria or hematochezia. She is asymptomatic from the anemia. I will observe for now.  She does not require transfusion now. I will continue the chemotherapy at current dose without dosage adjustment.  If the anemia gets progressive worse in the future, I might have to delay her treatment or adjust the chemotherapy dose.   Other constipation She has mild constipation after treatment I recommend daily MiraLAX for few days to avoid severe constipation   No orders of the defined types were placed in this encounter.   INTERVAL HISTORY: Please see below for problem oriented charting. She returns for chemotherapy and follow-up She feels well with the treatment except for mild constipation She also had mild flare of joint pain but not severe, resolved with Tylenol No recent infection, fever or chills Denies peripheral neuropathy.  SUMMARY OF ONCOLOGIC HISTORY: Oncology History   MSI stable, papillary serous     History of left breast cancer   10/23/2011 Mammogram    Suspicious mass at 10:00 position 7 cm from the left nipple. Ultrasound irregular hypoechoic  mass 7 x 6 x 5 mm    01/06/2012 Initial Biopsy    Initial biopsy was benign . 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 Cervix, biopsy - HIGH GRADE CARCINOMA. - SEE COMMENT. 2. Endocervix, curettage - HIGH GRADE CARCINOMA. - SEE COMMENT. 3. Endometrium, biopsy - HIGH GRADE CARCINOMA. - SEE COMMENT. Microscopic Comment 1. - 3. The carcinoma in the three specimens is morphologically similar and has features suggestive of papillary serous carcinoma.    07/26/2018 Tumor Marker    Patient's tumor was tested for the following markers: CA-125 Results of the tumor marker test revealed 835    07/28/2018 Imaging    1. Several (at least 10) solid pulmonary nodules scattered throughout both lungs are new since 2012 chest CT, likely representing pulmonary metastases, largest 6 mm, below PET resolution. 2. Redemonstration of hypodense 2.5 cm segment 7  right liver lobe mass, indeterminate, suspicious for liver metastasis. 3. 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 4. Three-vessel coronary atherosclerosis.  Aortic Atherosclerosis (ICD10-I70.0).    08/03/2018 Cancer Staging    Staging form: Corpus Uteri - Carcinoma and Carcinosarcoma, AJCC 8th Edition - Clinical:  FIGO Stage IVB (cT3b, cN2, cM1) - Signed by [name], MD on 08/03/2018    08/05/2018 Tumor Marker    Patient's tumor was tested for the following markers: CA-125 Results of the tumor marker test revealed 901    08/06/2018 Procedure    Placement of single lumen port a cath via right internal jugular vein. The catheter tip lies at the cavo-atrial junction. A power injectable port a cath was placed and is ready for immediate use.     08/11/2018 -  Chemotherapy    The patient had carboplatin and taxol     REVIEW OF SYSTEMS:   Constitutional: Denies fevers, chills or abnormal weight loss Eyes: Denies blurriness of vision Ears, nose, mouth, throat, and face: Denies mucositis or sore throat Respiratory: Denies cough, dyspnea or wheezes Cardiovascular: Denies palpitation, chest discomfort or lower extremity swelling Skin: Denies abnormal skin rashes Lymphatics: Denies new lymphadenopathy or easy bruising Neurological:Denies numbness, tingling or new weaknesses Behavioral/Psych: Mood is stable, no new changes  All other systems were reviewed with the patient and are negative.  I have reviewed the past medical history, past surgical history, social history and family history with the patient and they are unchanged from previous note.  ALLERGIES:  has No Known Allergies.  MEDICATIONS:  Current Outpatient Medications  Medication Sig Dispense Refill  . acetaminophen (TYLENOL) 500 MG tablet Take 1 tablet (500 mg total) by mouth every 6 (six) hours as needed. (Patient taking differently: Take 500 mg by mouth every 6 (six) hours as needed for moderate pain. ) 30 tablet 3  . Calcium Carbonate (CALTRATE 600 PO) Take 1 tablet by mouth 2 (two) times daily.     Marland Kitchen dexamethasone (DECADRON) 4 MG tablet Take 3 tabs the night before and 3 tabs on the morning of chemotherapy every 3 weeks with food 30 tablet 1  . enalapril (VASOTEC) 20 MG tablet Take 1 tablet (20 mg total) by mouth daily. 180 tablet 2  .  enoxaparin (LOVENOX) 120 MG/0.8ML injection Inject 0.8 mLs (120 mg total) into the skin every 12 (twelve) hours. 20 Syringe 0  . Fluticasone-Salmeterol (WIXELA INHUB) 100-50 MCG/DOSE AEPB Inhale 1 puff into the lungs daily. 60 each 0  . furosemide (LASIX) 40 MG tablet TAKE 1 TABLET BY MOUTH DAILY AS NEEDED FOR LEG SWELLING 90 tablet 2  . hydrochlorothiazide (HYDRODIURIL) 25 MG tablet TAKE 1 TABLET(25 MG) BY MOUTH DAILY 90 tablet 1  . lidocaine-prilocaine (EMLA) cream Apply to affected area once 30 g 3  . Multiple Vitamin (MULTIVITAMIN) tablet Take 1 tablet by mouth daily.    . Multiple Vitamins-Minerals (ICAPS AREDS 2 PO) Take 1 capsule by mouth 2 (two) times daily.    . Multiple Vitamins-Minerals (WOMENS BONE HEALTH PO) Take 1 tablet by mouth daily.    . ondansetron (ZOFRAN) 8 MG tablet Take 1 tablet (8 mg total) by mouth every 8 (eight) hours as needed. Start on the third day after chemotherapy. 30 tablet 1  . prochlorperazine (COMPAZINE) 10 MG tablet Take 1 tablet (10 mg total) by mouth every 6 (six) hours as needed (Nausea or vomiting). 30 tablet 1  . rivaroxaban (XARELTO) 20 MG TABS tablet Take 1 tablet (20 mg total) by  mouth daily. 90 tablet 0  . senna (SENOKOT) 8.6 MG TABS tablet Take 1 tablet (8.6 mg total) by mouth daily. 120 each 0  . Vitamin D, Cholecalciferol, 1000 units CAPS Take by mouth daily.     No current facility-administered medications for this visit.    Facility-Administered Medications Ordered in Other Visits  Medication Dose Route Frequency Provider Last Rate Last Dose  . CARBOplatin (PARAPLATIN) 560 mg in sodium chloride 0.9 % 250 mL chemo infusion  560 mg Intravenous Once Alvy Bimler, Ni, MD      . fosaprepitant (EMEND) 150 mg, dexamethasone (DECADRON) 12 mg in sodium chloride 0.9 % 145 mL IVPB   Intravenous Once [name], MD 454 mL/hr at 09/01/18 1123    . heparin lock flush 100 unit/mL  500 Units Intracatheter Once PRN Alvy Bimler, Ni, MD      . PACLitaxel (TAXOL) 336 mg  in sodium chloride 0.9 % 500 mL chemo infusion (> '80mg'$ /m2)  140 mg/m2 (Treatment Plan Recorded) Intravenous Once Gorsuch, Ni, MD      . sodium chloride flush (NS) 0.9 % injection 10 mL  10 mL Intracatheter PRN Alvy Bimler, Ni, MD        PHYSICAL EXAMINATION: ECOG PERFORMANCE STATUS: 1 - Symptomatic but completely ambulatory  Vitals:   09/01/18 0956  BP: (!) 157/66  Pulse: (!) 102  Resp: 18  Temp: 98.1 F (36.7 C)  SpO2: 100%   Filed Weights   09/01/18 0956  Weight: 265 lb 11.2 oz (120.5 kg)    GENERAL:alert, no distress and comfortable SKIN: skin color, texture, turgor are normal, no rashes or significant lesions EYES: normal, Conjunctiva are pink and non-injected, sclera clear OROPHARYNX:no exudate, no erythema and lips, buccal mucosa, and tongue normal  NECK: supple, thyroid normal size, non-tender, without nodularity LYMPH:  no palpable lymphadenopathy in the cervical, axillary or inguinal LUNGS: clear to auscultation and percussion with normal breathing effort HEART: regular rate & rhythm and no murmurs and no lower extremity edema ABDOMEN:abdomen soft, non-tender and normal bowel sounds Musculoskeletal:no cyanosis of digits and no clubbing  NEURO: alert & oriented x 3 with fluent speech, no focal motor/sensory deficits  LABORATORY DATA:  I have reviewed the data as listed    Component Value Date/Time   NA 139 09/01/2018 0944   NA 142 12/25/2014 1122   K 3.7 09/01/2018 0944   K 4.1 12/25/2014 1122   CL 102 09/01/2018 0944   CL 102 10/04/2012 1159   CO2 23 09/01/2018 0944   CO2 29 12/25/2014 1122   GLUCOSE 166 (H) 09/01/2018 0944   GLUCOSE 99 12/25/2014 1122   GLUCOSE 116 (H) 10/04/2012 1159   BUN 18 09/01/2018 0944   BUN 13.7 12/25/2014 1122   CREATININE 1.00 09/01/2018 0944   CREATININE 0.96 (H) 02/27/2016 1530   CREATININE 0.9 12/25/2014 1122   CALCIUM 9.9 09/01/2018 0944   CALCIUM 10.1 12/25/2014 1122   PROT 8.0 09/01/2018 0944   PROT 7.8 12/25/2014 1122    ALBUMIN 3.3 (L) 09/01/2018 0944   ALBUMIN 3.4 (L) 12/25/2014 1122   AST 17 09/01/2018 0944   AST 21 12/25/2014 1122   ALT 11 09/01/2018 0944   ALT 17 12/25/2014 1122   ALKPHOS 77 09/01/2018 0944   ALKPHOS 99 12/25/2014 1122   BILITOT 0.2 (L) 09/01/2018 0944   BILITOT 0.41 12/25/2014 1122   GFRNONAA 54 (L) 09/01/2018 0944   GFRNONAA 58 (L) 02/27/2016 1530   GFRAA >60 09/01/2018 0944   GFRAA 66 02/27/2016 1530  No results found for: SPEP, UPEP  Lab Results  Component Value Date   WBC 10.1 09/01/2018   NEUTROABS 9.3 (H) 09/01/2018   HGB 9.8 (L) 09/01/2018   HCT 30.4 (L) 09/01/2018   MCV 92.7 09/01/2018   PLT 177 09/01/2018      Chemistry      Component Value Date/Time   NA 139 09/01/2018 0944   NA 142 12/25/2014 1122   K 3.7 09/01/2018 0944   K 4.1 12/25/2014 1122   CL 102 09/01/2018 0944   CL 102 10/04/2012 1159   CO2 23 09/01/2018 0944   CO2 29 12/25/2014 1122   BUN 18 09/01/2018 0944   BUN 13.7 12/25/2014 1122   CREATININE 1.00 09/01/2018 0944   CREATININE 0.96 (H) 02/27/2016 1530   CREATININE 0.9 12/25/2014 1122      Component Value Date/Time   CALCIUM 9.9 09/01/2018 0944   CALCIUM 10.1 12/25/2014 1122   ALKPHOS 77 09/01/2018 0944   ALKPHOS 99 12/25/2014 1122   AST 17 09/01/2018 0944   AST 21 12/25/2014 1122   ALT 11 09/01/2018 0944   ALT 17 12/25/2014 1122   BILITOT 0.2 (L) 09/01/2018 0944   BILITOT 0.41 12/25/2014 1122       RADIOGRAPHIC STUDIES: I have personally reviewed the radiological images as listed and agreed with the findings in the report. Ir Imaging Guided Port Insertion  Result Date: 08/06/2018 CLINICAL DATA:  Uterine carcinoma and need for porta cath for chemotherapy. EXAM: IMPLANTED PORT A CATH PLACEMENT WITH ULTRASOUND AND FLUOROSCOPIC GUIDANCE ANESTHESIA/SEDATION: 2.0 mg IV Versed; 100 mcg IV Fentanyl Total Moderate Sedation Time:  37 minutes The patient's level of consciousness and physiologic status were continuously monitored  during the procedure by Radiology nursing. Additional Medications: 2 g IV Ancef. FLUOROSCOPY TIME:  24 seconds.  7.8 mGy. PROCEDURE: The procedure, risks, benefits, and alternatives were explained to the patient. Questions regarding the procedure were encouraged and answered. The patient understands and consents to the procedure. A time-out was performed prior to initiating the procedure. Ultrasound was utilized to confirm patency of the right internal jugular vein. The right neck and chest were prepped with chlorhexidine in a sterile fashion, and a sterile drape was applied covering the operative field. Maximum barrier sterile technique with sterile gowns and gloves were used for the procedure. Local anesthesia was provided with 1% lidocaine. After creating a small venotomy incision, a 21 gauge needle was advanced into the right internal jugular vein under direct, real-time ultrasound guidance. Ultrasound image documentation was performed. After securing guidewire access, an 8 Fr dilator was placed. A J-wire was kinked to measure appropriate catheter length. A subcutaneous port pocket was then created along the upper chest wall utilizing sharp and blunt dissection. Portable cautery was utilized. The pocket was irrigated with sterile saline. A single lumen power injectable port was chosen for placement. The 8 Fr catheter was tunneled from the port pocket site to the venotomy incision. The port was placed in the pocket. External catheter was trimmed to appropriate length based on guidewire measurement. At the venotomy, an 8 Fr peel-away sheath was placed over a guidewire. The catheter was then placed through the sheath and the sheath removed. Final catheter positioning was confirmed and documented with a fluoroscopic spot image. The port was accessed with a needle and aspirated and flushed with heparinized saline. The access needle was removed. The venotomy and port pocket incisions were closed with subcutaneous 3-0  Monocryl and subcuticular 4-0 Vicryl. Dermabond was applied  to both incisions. COMPLICATIONS: COMPLICATIONS None FINDINGS: After catheter placement, the tip lies at the cavo-atrial junction. The catheter aspirates normally and is ready for immediate use. IMPRESSION: Placement of single lumen port a cath via right internal jugular vein. The catheter tip lies at the cavo-atrial junction. A power injectable port a cath was placed and is ready for immediate use. Electronically Signed   By: Aletta Edouard M.D.   On: 08/06/2018 16:03    All questions were answered. The patient knows to call the clinic with any problems, questions or concerns. No barriers to learning was detected.  I spent 15 minutes counseling the patient face to face. The total time spent in the appointment was 20 minutes and more than 50% was on counseling and review of test results  [name], MD 09/01/2018 11:39 AM

## 2018-09-01 NOTE — Assessment & Plan Note (Signed)
She has mild constipation after treatment I recommend daily MiraLAX for few days to avoid severe constipation

## 2018-09-01 NOTE — Patient Instructions (Addendum)
[facility] Discharge Instructions for Patients Receiving Chemotherapy  Today you received the following chemotherapy agents Taxol and Carboplatin   To help prevent nausea and vomiting after your treatment, we encourage you to take your nausea medication as directed. No Zofran for 3 days, take Compazine instead.    If you develop nausea and vomiting that is not controlled by your nausea medication, call the clinic.   BELOW ARE SYMPTOMS THAT SHOULD BE REPORTED IMMEDIATELY:  *FEVER GREATER THAN 100.5 F  *CHILLS WITH OR WITHOUT FEVER  NAUSEA AND VOMITING THAT IS NOT CONTROLLED WITH YOUR NAUSEA MEDICATION  *UNUSUAL SHORTNESS OF BREATH  *UNUSUAL BRUISING OR BLEEDING  TENDERNESS IN MOUTH AND THROAT WITH OR WITHOUT PRESENCE OF ULCERS  *URINARY PROBLEMS  *BOWEL PROBLEMS  UNUSUAL RASH Items with * indicate a potential emergency and should be followed up as soon as possible.  Feel free to call the clinic should you have any questions or concerns. The clinic phone number is [phone].   Please show the [place] at check-in to the Emergency Department and triage nurse.   Coronavirus (COVID-19) Are you at risk?  Are you at risk for the Coronavirus (COVID-19)?  To be considered HIGH RISK for Coronavirus (COVID-19), you have to meet the following criteria:  . Traveled to [place], [place], [place], [place] or [place]; or in the [place] to [place], [place], [place], or [place]; and have fever, cough, and shortness of breath within the last 2 weeks of travel OR . Been in close contact with a person diagnosed with COVID-19 within the last 2 weeks and have fever, cough, and shortness of breath . IF YOU DO NOT MEET THESE CRITERIA, YOU ARE CONSIDERED LOW RISK FOR COVID-19.  What to do if you are HIGH RISK for COVID-19?  Marland Kitchen If you are having a medical emergency, call 911. . Seek medical care right away. Before you go to a doctor's office, urgent care  or emergency department, call ahead and tell them about your recent travel, contact with someone diagnosed with COVID-19, and your symptoms. You should receive instructions from your physician's office regarding next steps of care.  . When you arrive at healthcare provider, tell the healthcare staff immediately you have returned from visiting [place], [place], [place], [place] or [place]; or traveled in the [place] to [place], [place], [place], or [place]; in the last two weeks or you have been in close contact with a person diagnosed with COVID-19 in the last 2 weeks.   . Tell the health care staff about your symptoms: fever, cough and shortness of breath. . After you have been seen by a medical provider, you will be either: o Tested for (COVID-19) and discharged home on quarantine except to seek medical care if symptoms worsen, and asked to  - Stay home and avoid contact with others until you get your results (4-5 days)  - Avoid travel on public transportation if possible (such as bus, train, or airplane) or o Sent to the Emergency Department by EMS for evaluation, COVID-19 testing, and possible admission depending on your condition and test results.  What to do if you are LOW RISK for COVID-19?  Reduce your risk of any infection by using the same precautions used for avoiding the common cold or flu:  Marland Kitchen Wash your hands often with soap and warm water for at least 20 seconds.  If soap and water are not readily available, use  an alcohol-based hand sanitizer with at least 60% alcohol.  . If coughing or sneezing, cover your mouth and nose by coughing or sneezing into the elbow areas of your shirt or coat, into a tissue or into your sleeve (not your hands). . Avoid shaking hands with others and consider head nods or verbal greetings only. . Avoid touching your eyes, nose, or mouth with unwashed hands.  . Avoid close contact with people who are sick. . Avoid places or events with large  numbers of people in one location, like concerts or sporting events. . Carefully consider travel plans you have or are making. . If you are planning any travel outside or inside the [place], visit the CDC's Travelers' Health webpage for the latest health notices. . If you have some symptoms but not all symptoms, continue to monitor at home and seek medical attention if your symptoms worsen. . If you are having a medical emergency, call 911.   [place] / e-Visit: [email or web address]         [facility]: [facility]: [phone]                   [facility]: [phone]

## 2018-09-01 NOTE — Assessment & Plan Note (Signed)
Her blood pressure is intermittently elevated She is not symptomatic Observe only

## 2018-09-01 NOTE — Telephone Encounter (Signed)
Per 4/1 los - Return for No new orders.

## 2018-09-01 NOTE — Assessment & Plan Note (Signed)
She tolerated chemo well except for mild anemia, mild flare of joint pain and some constipation We will continue without dose adjustment

## 2018-09-02 LAB — CA 125: Cancer Antigen (CA) 125: 406 U/mL — ABNORMAL HIGH (ref 0.0–38.1)

## 2018-09-10 ENCOUNTER — Telehealth: Payer: Self-pay | Admitting: Licensed Clinical Social Worker

## 2018-09-10 ENCOUNTER — Ambulatory Visit: Payer: Self-pay | Admitting: Licensed Clinical Social Worker

## 2018-09-10 ENCOUNTER — Encounter: Payer: Self-pay | Admitting: Licensed Clinical Social Worker

## 2018-09-10 DIAGNOSIS — Z1379 Encounter for other screening for genetic and chromosomal anomalies: Secondary | ICD-10-CM

## 2018-09-10 DIAGNOSIS — Z853 Personal history of malignant neoplasm of breast: Secondary | ICD-10-CM

## 2018-09-10 DIAGNOSIS — C55 Malignant neoplasm of uterus, part unspecified: Secondary | ICD-10-CM

## 2018-09-10 DIAGNOSIS — Z803 Family history of malignant neoplasm of breast: Secondary | ICD-10-CM

## 2018-09-10 DIAGNOSIS — Z8042 Family history of malignant neoplasm of prostate: Secondary | ICD-10-CM

## 2018-09-10 NOTE — Telephone Encounter (Signed)
Revealed negative genetic testing.   This normal result is reassuring and indicates that it is unlikely Erin Esparza's cancer is due to a hereditary cause.  It is unlikely that there is an increased risk of another cancer due to a mutation in one of these genes.  However, genetic testing is not perfect, and cannot definitively rule out a hereditary cause.  It will be important for her to keep in contact with genetics to learn if any additional testing may be needed in the future.

## 2018-09-10 NOTE — Progress Notes (Signed)
HPI:  Erin Esparza was previously seen in the [facility] due to a personal and family history of cancer and concerns regarding a hereditary predisposition to cancer. Please refer to our prior cancer genetics clinic note for more information regarding our discussion, assessment and recommendations, at the time. Erin Esparza recent genetic test results were disclosed to her, as were recommendations warranted by these results. These results and recommendations are discussed in more detail below.  CANCER HISTORY:  Oncology History   MSI stable, papillary serous     History of left breast cancer   10/23/2011 Mammogram    Suspicious mass at 10:00 position 7 cm from the left nipple. Ultrasound irregular hypoechoic mass 7 x 6 x 5 mm    01/06/2012 Initial Biopsy    Initial biopsy was benign . 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 Cervix, biopsy - HIGH GRADE CARCINOMA. - SEE COMMENT. 2. Endocervix, curettage - HIGH GRADE CARCINOMA. - SEE COMMENT. 3. Endometrium, biopsy - HIGH GRADE CARCINOMA. - SEE COMMENT. Microscopic Comment 1. - 3. The carcinoma in the three specimens is morphologically similar and has features suggestive of papillary serous carcinoma.    07/26/2018 Tumor Marker    Patient's tumor was tested for the following markers: CA-125 Results of the tumor marker test revealed 835    07/28/2018 Imaging    1. Several (at least 10) solid pulmonary nodules scattered throughout both lungs are new since 2012 chest CT, likely representing pulmonary metastases, largest 6 mm, below PET resolution. 2. Redemonstration of hypodense 2.5 cm segment 7 right liver lobe mass, indeterminate, suspicious for liver metastasis. 3. 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 4. Three-vessel coronary atherosclerosis.  Aortic Atherosclerosis (ICD10-I70.0).    08/03/2018 Cancer Staging    Staging form: Corpus Uteri - Carcinoma and Carcinosarcoma, AJCC 8th Edition - Clinical: FIGO Stage IVB (cT3b, cN2, cM1) - Signed by [name], MD on 08/03/2018    08/05/2018 Tumor Marker    Patient's tumor was tested for the following markers: CA-125 Results of the tumor marker test revealed 901    08/06/2018 Procedure    Placement of single lumen port a cath via right internal jugular vein. The catheter tip lies at the cavo-atrial junction. A power injectable port a cath was placed and is ready for immediate use.     08/11/2018 -  Chemotherapy    The patient had carboplatin and taxol    09/01/2018 Tumor Marker    Patient's tumor was tested for the following markers: CA-125 Results of the tumor marker test revealed 406    09/09/2018 Genetic Testing    The Common Hereditary Cancers Panel offered by  [facility] includes sequencing and/or deletion duplication testing of the following 48 genes: APC, ATM, AXIN2, BARD1, BMPR1A, BRCA1, BRCA2, BRIP1, CDH1, CDKN2A (p14ARF), CDKN2A (p16INK4a), CKD4, CHEK2, CTNNA1, DICER1, EPCAM (Deletion/duplication testing only), GREM1 (promoter region deletion/duplication testing only), KIT, MEN1, MLH1, MSH2, MSH3, MSH6, MUTYH, NBN, NF1, NHTL1, PALB2, PDGFRA, PMS2, POLD1, POLE, PTEN, RAD50, RAD51C, RAD51D, RNF43, SDHB, SDHC, SDHD,  SMAD4, SMARCA4. STK11, TP53, TSC1, TSC2, and VHL.  The following genes were evaluated for sequence changes only: SDHA and HOXB13 c.251G>A variant only.  Results: Negative, no pathogenic variants identified. The report date is 09/09/2018.       FAMILY HISTORY:  We obtained a detailed, 4-generation family history.  Significant diagnoses are listed below: Family History  Problem Relation Age of Onset   Heart disease Mother    Heart attack Mother    Breast cancer Sister 33   Parkinson's disease Brother    Prostate cancer Brother    Pulmonary embolism Daughter    Diabetes Daughter    Pulmonary embolism Daughter    Breast cancer Sister    Other Brother        MVA   Other Brother        MVA   COPD Brother    Breast cancer Niece        dx 51s   Erin Esparza has 2 daughters, ages 73 and 39, and 3 sons, ages 23, 108 and 66. No cancers for her children or grandchildren. Erin Esparza had 6 sisters and 5 brothers. Two of her sisters had breast cancer. One died at 71. This sister had a daughter who also was diagnosed with breast cancer, in her 33s, and passed away in her 7s. Erin Esparza's other sister with breast cancer was diagnosed when she was older than 73. Erin Esparza last remaining sibling, her brother, was diagnosed with prostate cancer, she is unsure of what age, but he is currently living at 57. No other cancers for her brothers/sisters/nieces/nephews.  Erin Esparza mother died at 37. Erin Esparza had 3 maternal uncles, 5 maternal aunts. No cancers for these relatives or her maternal cousins that she is aware of, although she has limited information. Her maternal grandparents both died when they were "older."   Erin Esparza's father died in his 33s or 85s. He had a brother and 3 sisters, Erin Esparza does not have information about these paternal aunts/uncle. She also does not have information about her paternal cousins or grandparents.  Erin Esparza is unaware of previous family  history of genetic testing for hereditary cancer risks. Patient's ancestors are of African American/Caucasian descent. There is no reported Ashkenazi Jewish ancestry. There is no known consanguinity.   GENETIC TEST RESULTS: Genetic testing reported out on 09/09/2018 through the [facility] Common Hereditary cancer panel found no pathogenic mutations.   The Common Hereditary Cancers Panel offered by [facility] includes sequencing and/or deletion duplication testing of the following 48 genes: APC, ATM, AXIN2, BARD1, BMPR1A, BRCA1, BRCA2, BRIP1, CDH1, CDKN2A (p14ARF), CDKN2A (p16INK4a), CKD4, CHEK2, CTNNA1, DICER1, EPCAM (Deletion/duplication testing only), GREM1 (promoter region deletion/duplication testing only), KIT, MEN1, MLH1, MSH2, MSH3, MSH6, MUTYH, NBN, NF1, NHTL1, PALB2, PDGFRA, PMS2, POLD1, POLE, PTEN, RAD50, RAD51C, RAD51D,RNF43, SDHB, SDHC, SDHD, SMAD4, SMARCA4. STK11, TP53, TSC1, TSC2, and VHL.  The following genes were evaluated for sequence changes only: SDHA and HOXB13 c.251G>A variant only.  The test report has been scanned into EPIC and is located under the Molecular Pathology section of the Results Review tab.  A portion of the  result report is included below for reference.     We discussed with Erin Esparza that because current genetic testing is not perfect, it is possible there may be a gene mutation in one of these genes that current testing cannot detect, but that chance is small.  We also discussed, that there could be another gene that has not yet been discovered, or that we have not yet tested, that is responsible for the cancer diagnoses in the family. It is also possible there is a hereditary cause for the cancer in the family that Erin Esparza did not inherit and therefore was not identified in her testing.  Therefore, it is important to remain in touch with cancer genetics in the future so that we can continue to offer Erin Esparza the most up to date genetic testing.   ADDITIONAL GENETIC  TESTING: We discussed with Erin Esparza that her genetic testing was fairly extensive.  If there are genes identified to increase cancer risk that can be analyzed in the future, we would be happy to discuss and coordinate this testing at that time.    CANCER SCREENING RECOMMENDATIONS: Erin Esparza test result is considered negative (normal).  This means that we have not identified a hereditary cause for her  personal and family history of cancer at this time. Most cancers happen by chance and this negative test suggests that her cancer may fall into this category.    While reassuring, this does not definitively rule out a hereditary predisposition to cancer. It is still possible that there could be genetic mutations that are undetectable by current technology. There could be genetic mutations in genes that have not been tested or identified to increase cancer risk.  Therefore, it is recommended she continue to follow the cancer management and screening guidelines provided by her oncology and primary healthcare provider.   An individual's cancer risk and medical management are not determined by genetic test results alone. Overall cancer risk assessment incorporates additional factors, including personal medical history, family history, and any available genetic information that may result in a personalized plan for cancer prevention and surveillance  RECOMMENDATIONS FOR FAMILY MEMBERS:  Relatives in this family might be at some increased risk of developing cancer, over the general population risk, simply due to the family history of cancer.  We recommended women in this family have a yearly mammogram beginning at age 9, or 99 years younger than the earliest onset of cancer, an annual clinical breast exam, and perform monthly breast self-exams. Women in this family should also have a gynecological exam as recommended by their primary provider. All family members should have a colonoscopy by age 41.  It is  also possible there is a hereditary cause for the cancer in Erin Esparza family that she did not inherit and therefore was not identified in her.  Based on Erin Esparza's family history, we recommended her nieces/nephews/siblings have genetic counseling and testing. Erin. Stonehocker will let us know if we can be of any assistance in coordinating genetic counseling and/or testing for this family member.   FOLLOW-UP: Lastly, we discussed with Erin. Gaffey that cancer genetics is a rapidly advancing field and it is possible that new genetic tests will be appropriate for her and/or her family members in the future. We encouraged her to remain in contact with cancer genetics on an annual basis so we can update her personal and family histories and let her know of advances in cancer genetics that may benefit this family.  Our contact number was provided. Erin. Herzberg questions were answered to her satisfaction, and she knows she is welcome to call us at anytime with additional questions or concerns.   Faith Rogue, Erin Genetic Counselor [email or web address] Phone: [phone]

## 2018-09-22 ENCOUNTER — Inpatient Hospital Stay: Payer: Medicare Other

## 2018-09-22 ENCOUNTER — Other Ambulatory Visit: Payer: Self-pay

## 2018-09-22 ENCOUNTER — Inpatient Hospital Stay (HOSPITAL_BASED_OUTPATIENT_CLINIC_OR_DEPARTMENT_OTHER): Payer: Medicare Other | Admitting: Hematology and Oncology

## 2018-09-22 ENCOUNTER — Other Ambulatory Visit: Payer: Self-pay | Admitting: Hematology and Oncology

## 2018-09-22 DIAGNOSIS — I10 Essential (primary) hypertension: Secondary | ICD-10-CM | POA: Diagnosis not present

## 2018-09-22 DIAGNOSIS — K5909 Other constipation: Secondary | ICD-10-CM | POA: Diagnosis not present

## 2018-09-22 DIAGNOSIS — Z853 Personal history of malignant neoplasm of breast: Secondary | ICD-10-CM

## 2018-09-22 DIAGNOSIS — N183 Chronic kidney disease, stage 3 unspecified: Secondary | ICD-10-CM

## 2018-09-22 DIAGNOSIS — I129 Hypertensive chronic kidney disease with stage 1 through stage 4 chronic kidney disease, or unspecified chronic kidney disease: Secondary | ICD-10-CM | POA: Diagnosis not present

## 2018-09-22 DIAGNOSIS — C55 Malignant neoplasm of uterus, part unspecified: Secondary | ICD-10-CM

## 2018-09-22 DIAGNOSIS — D61818 Other pancytopenia: Secondary | ICD-10-CM

## 2018-09-22 DIAGNOSIS — D6481 Anemia due to antineoplastic chemotherapy: Secondary | ICD-10-CM | POA: Diagnosis not present

## 2018-09-22 DIAGNOSIS — D61810 Antineoplastic chemotherapy induced pancytopenia: Secondary | ICD-10-CM

## 2018-09-22 DIAGNOSIS — C7800 Secondary malignant neoplasm of unspecified lung: Secondary | ICD-10-CM

## 2018-09-22 DIAGNOSIS — Z5111 Encounter for antineoplastic chemotherapy: Secondary | ICD-10-CM | POA: Diagnosis not present

## 2018-09-22 LAB — COMPREHENSIVE METABOLIC PANEL
ALT: 9 U/L (ref 0–44)
AST: 18 U/L (ref 15–41)
Albumin: 3.2 g/dL — ABNORMAL LOW (ref 3.5–5.0)
Alkaline Phosphatase: 85 U/L (ref 38–126)
Anion gap: 11 (ref 5–15)
BUN: 13 mg/dL (ref 8–23)
CO2: 25 mmol/L (ref 22–32)
Calcium: 9.3 mg/dL (ref 8.9–10.3)
Chloride: 104 mmol/L (ref 98–111)
Creatinine, Ser: 1.09 mg/dL — ABNORMAL HIGH (ref 0.44–1.00)
GFR calc Af Amer: 56 mL/min — ABNORMAL LOW (ref >60)
GFR calc non Af Amer: 48 mL/min — ABNORMAL LOW (ref >60)
Glucose, Bld: 144 mg/dL — ABNORMAL HIGH (ref 70–99)
Potassium: 3.7 mmol/L (ref 3.5–5.1)
Sodium: 140 mmol/L (ref 135–145)
Total Bilirubin: 0.2 mg/dL — ABNORMAL LOW (ref 0.3–1.2)
Total Protein: 7.8 g/dL (ref 6.5–8.1)

## 2018-09-22 LAB — CBC WITH DIFFERENTIAL/PLATELET
Abs Immature Granulocytes: 0.18 10*3/uL — ABNORMAL HIGH (ref 0.00–0.07)
Basophils Absolute: 0 10*3/uL (ref 0.0–0.1)
Basophils Relative: 0 %
Eosinophils Absolute: 0 10*3/uL (ref 0.0–0.5)
Eosinophils Relative: 0 %
HCT: 26.5 % — ABNORMAL LOW (ref 36.0–46.0)
Hemoglobin: 8.6 g/dL — ABNORMAL LOW (ref 12.0–15.0)
Immature Granulocytes: 2 %
Lymphocytes Relative: 6 %
Lymphs Abs: 0.6 10*3/uL — ABNORMAL LOW (ref 0.7–4.0)
MCH: 30.7 pg (ref 26.0–34.0)
MCHC: 32.5 g/dL (ref 30.0–36.0)
MCV: 94.6 fL (ref 80.0–100.0)
Monocytes Absolute: 0.1 10*3/uL (ref 0.1–1.0)
Monocytes Relative: 1 %
Neutro Abs: 9 10*3/uL — ABNORMAL HIGH (ref 1.7–7.7)
Neutrophils Relative %: 91 %
Platelets: 96 10*3/uL — ABNORMAL LOW (ref 150–400)
RBC: 2.8 MIL/uL — ABNORMAL LOW (ref 3.87–5.11)
RDW: 16.1 % — ABNORMAL HIGH (ref 11.5–15.5)
WBC: 9.9 10*3/uL (ref 4.0–10.5)
nRBC: 0 % (ref 0.0–0.2)

## 2018-09-22 MED ORDER — DIPHENHYDRAMINE HCL 50 MG/ML IJ SOLN
INTRAMUSCULAR | Status: AC
  Filled 2018-09-22: qty 1

## 2018-09-22 MED ORDER — SODIUM CHLORIDE 0.9 % IV SOLN
Freq: Once | INTRAVENOUS | Status: AC
  Administered 2018-09-22: 11:00:00 via INTRAVENOUS
  Filled 2018-09-22: qty 250

## 2018-09-22 MED ORDER — PALONOSETRON HCL INJECTION 0.25 MG/5ML
INTRAVENOUS | Status: AC
  Filled 2018-09-22: qty 5

## 2018-09-22 MED ORDER — SODIUM CHLORIDE 0.9 % IV SOLN
20.0000 mg | Freq: Once | INTRAVENOUS | Status: AC
  Administered 2018-09-22: 20 mg via INTRAVENOUS
  Filled 2018-09-22: qty 2

## 2018-09-22 MED ORDER — HEPARIN SOD (PORK) LOCK FLUSH 100 UNIT/ML IV SOLN
500.0000 [IU] | Freq: Once | INTRAVENOUS | Status: AC | PRN
  Administered 2018-09-22: 500 [IU]
  Filled 2018-09-22: qty 5

## 2018-09-22 MED ORDER — SODIUM CHLORIDE 0.9 % IV SOLN
Freq: Once | INTRAVENOUS | Status: AC
  Administered 2018-09-22: 11:00:00 via INTRAVENOUS
  Filled 2018-09-22: qty 5

## 2018-09-22 MED ORDER — DIPHENHYDRAMINE HCL 50 MG/ML IJ SOLN
50.0000 mg | Freq: Once | INTRAMUSCULAR | Status: AC
  Administered 2018-09-22: 50 mg via INTRAVENOUS

## 2018-09-22 MED ORDER — SODIUM CHLORIDE 0.9 % IV SOLN
422.0000 mg | Freq: Once | INTRAVENOUS | Status: AC
  Administered 2018-09-22: 420 mg via INTRAVENOUS
  Filled 2018-09-22: qty 42

## 2018-09-22 MED ORDER — SODIUM CHLORIDE 0.9% FLUSH
10.0000 mL | Freq: Once | INTRAVENOUS | Status: AC
  Administered 2018-09-22: 10 mL
  Filled 2018-09-22: qty 10

## 2018-09-22 MED ORDER — SODIUM CHLORIDE 0.9 % IV SOLN
140.0000 mg/m2 | Freq: Once | INTRAVENOUS | Status: AC
  Administered 2018-09-22: 336 mg via INTRAVENOUS
  Filled 2018-09-22: qty 56

## 2018-09-22 MED ORDER — PALONOSETRON HCL INJECTION 0.25 MG/5ML
0.2500 mg | Freq: Once | INTRAVENOUS | Status: AC
  Administered 2018-09-22: 0.25 mg via INTRAVENOUS

## 2018-09-22 MED ORDER — SODIUM CHLORIDE 0.9% FLUSH
10.0000 mL | INTRAVENOUS | Status: DC | PRN
  Administered 2018-09-22: 16:00:00 10 mL
  Filled 2018-09-22: qty 10

## 2018-09-22 MED ORDER — FAMOTIDINE IN NACL 20-0.9 MG/50ML-% IV SOLN: 20.0000 mg | Freq: Once | INTRAVENOUS | Status: DC

## 2018-09-22 NOTE — Patient Instructions (Signed)
[facility] Discharge Instructions for Patients Receiving Chemotherapy  Today you received the following chemotherapy agents Paclitaxel (TAXOL) & Carboplatin (PARAPLATIN).  To help prevent nausea and vomiting after your treatment, we encourage you to take your nausea medication as prescribed.   If you develop nausea and vomiting that is not controlled by your nausea medication, call the clinic.   BELOW ARE SYMPTOMS THAT SHOULD BE REPORTED IMMEDIATELY:  *FEVER GREATER THAN 100.5 F  *CHILLS WITH OR WITHOUT FEVER  NAUSEA AND VOMITING THAT IS NOT CONTROLLED WITH YOUR NAUSEA MEDICATION  *UNUSUAL SHORTNESS OF BREATH  *UNUSUAL BRUISING OR BLEEDING  TENDERNESS IN MOUTH AND THROAT WITH OR WITHOUT PRESENCE OF ULCERS  *URINARY PROBLEMS  *BOWEL PROBLEMS  UNUSUAL RASH Items with * indicate a potential emergency and should be followed up as soon as possible.  Feel free to call the clinic should you have any questions or concerns. The clinic phone number is [phone].  Please show the CHEMO ALERT CARD at check-in to the Emergency Department and triage nurse.  Coronavirus (COVID-19) Are you at risk?  Are you at risk for the Coronavirus (COVID-19)?  To be considered HIGH RISK for Coronavirus (COVID-19), you have to meet the following criteria:  . Traveled to [place], [place], [place], [place] or [place]; or in the [place] to [place], [place], [place], or [place]; and have fever, cough, and shortness of breath within the last 2 weeks of travel OR . Been in close contact with a person diagnosed with COVID-19 within the last 2 weeks and have fever, cough, and shortness of breath . IF YOU DO NOT MEET THESE CRITERIA, YOU ARE CONSIDERED LOW RISK FOR COVID-19.  What to do if you are HIGH RISK for COVID-19?  . If you are having a medical emergency, call 911. . Seek medical care right away. Before you go to a doctor's office, urgent care or emergency department,  call ahead and tell them about your recent travel, contact with someone diagnosed with COVID-19, and your symptoms. You should receive instructions from your physician's office regarding next steps of care.  . When you arrive at healthcare provider, tell the healthcare staff immediately you have returned from visiting [place], [place], [place], [place] or [place]; or traveled in the [place] to [place], [place], [place], or [place]; in the last two weeks or you have been in close contact with a person diagnosed with COVID-19 in the last 2 weeks.   . Tell the health care staff about your symptoms: fever, cough and shortness of breath. . After you have been seen by a medical provider, you will be either: o Tested for (COVID-19) and discharged home on quarantine except to seek medical care if symptoms worsen, and asked to  - Stay home and avoid contact with others until you get your results (4-5 days)  - Avoid travel on public transportation if possible (such as bus, train, or airplane) or o Sent to the Emergency Department by EMS for evaluation, COVID-19 testing, and possible admission depending on your condition and test results.  What to do if you are LOW RISK for COVID-19?  Reduce your risk of any infection by using the same precautions used for avoiding the common cold or flu:  . Wash your hands often with soap and warm water for at least 20 seconds.  If soap and water are not readily available, use an alcohol-based hand sanitizer with at least 60% alcohol.  .   If coughing or sneezing, cover your mouth and nose by coughing or sneezing into the elbow areas of your shirt or coat, into a tissue or into your sleeve (not your hands). . Avoid shaking hands with others and consider head nods or verbal greetings only. . Avoid touching your eyes, nose, or mouth with unwashed hands.  . Avoid close contact with people who are sick. . Avoid places or events with large numbers of people in one  location, like concerts or sporting events. . Carefully consider travel plans you have or are making. . If you are planning any travel outside or inside the [place], visit the CDC's Travelers' Health webpage for the latest health notices. . If you have some symptoms but not all symptoms, continue to monitor at home and seek medical attention if your symptoms worsen. . If you are having a medical emergency, call 911.   ADDITIONAL HEALTHCARE OPTIONS FOR PATIENTS  [facility] / e-Visit: [email or web address]         [facility]: [phone]   Urgent Care: [phone]                   [facility]: [phone]   

## 2018-09-22 NOTE — Progress Notes (Signed)
Per Dr Alvy Bimler OK for treatment with PLT of 96.  Dr Alvy Bimler to reduce carboplatin dose.

## 2018-09-23 LAB — CA 125: Cancer Antigen (CA) 125: 110 U/mL — ABNORMAL HIGH (ref 0.0–38.1)

## 2018-09-24 ENCOUNTER — Encounter: Payer: Self-pay | Admitting: Hematology and Oncology

## 2018-09-24 DIAGNOSIS — C7800 Secondary malignant neoplasm of unspecified lung: Secondary | ICD-10-CM | POA: Insufficient documentation

## 2018-09-24 DIAGNOSIS — N183 Chronic kidney disease, stage 3 unspecified: Secondary | ICD-10-CM | POA: Insufficient documentation

## 2018-09-24 DIAGNOSIS — D61818 Other pancytopenia: Secondary | ICD-10-CM | POA: Insufficient documentation

## 2018-09-24 NOTE — Assessment & Plan Note (Signed)
This is improving.  She will continue laxatives as needed.

## 2018-09-24 NOTE — Assessment & Plan Note (Signed)
Overall, she tolerated chemotherapy well except for progressive pancytopenia Her tumor marker is improving After cycle 3, I plan to repeat imaging study for objective assessment of response to therapy I plan dose reduction due to progressive pancytopenia

## 2018-09-24 NOTE — Assessment & Plan Note (Signed)
Her blood pressure is intermittently elevated.  Observe for now.

## 2018-09-24 NOTE — Assessment & Plan Note (Signed)
I plan imaging study as above.

## 2018-09-24 NOTE — Progress Notes (Signed)
[place] OFFICE PROGRESS NOTE  Patient Care Team: Lovenia Kim, MD as PCP - General [name], MD as Consulting Physician (Ophthalmology) [name], DPM as Consulting Physician (Podiatry) [name], [name], MD as Referring Physician (Optometry) [name], MD as Consulting Physician (Orthopedic Surgery) [name], RN as Oncology Nurse Navigator (Oncology)  ASSESSMENT & PLAN:  Uterine cancer ([place]) Overall, she tolerated chemotherapy well except for progressive pancytopenia Her tumor marker is improving After cycle 3, I plan to repeat imaging study for objective assessment of response to therapy I plan dose reduction due to progressive pancytopenia  Pancytopenia, acquired [facility]) She has worsening pancytopenia likely due to side effects of chemotherapy I plan dose adjustment. This strategy is discussed with her daughter. Her daughter is educated to watch for signs and symptoms of anemia  Other constipation This is improving.  She will continue laxatives as needed.  Essential hypertension Her blood pressure is intermittently elevated.  Observe for now.  CKD (chronic kidney disease), stage III ([place]) She has stable CKD stage III Observe closely for now Planned dose adjustment as above  Metastasis to lung [facility]) I plan imaging study as above.   Orders Placed This Encounter  Procedures  . CT CHEST W CONTRAST    Standing Status:   Future    Standing Expiration Date:   09/24/2019    Order Specific Question:   If indicated for the ordered procedure, I authorize the administration of contrast media per Radiology protocol    Answer:   Yes    Order Specific Question:   Preferred imaging location?    Answer:   [facility]    Order Specific Question:   Radiology Contrast Protocol - do NOT remove file path    Answer:   \\[email or web address]  . CT ABDOMEN PELVIS W CONTRAST    Standing Status:   Future    Standing  Expiration Date:   09/24/2019    Order Specific Question:   If indicated for the ordered procedure, I authorize the administration of contrast media per Radiology protocol    Answer:   Yes    Order Specific Question:   Preferred imaging location?    Answer:   [facility]    Order Specific Question:   Radiology Contrast Protocol - do NOT remove file path    Answer:   \\[email or web address]    INTERVAL HISTORY: Please see below for problem oriented charting. She returns for cycle 3 of chemotherapy. I have also to call her daughter and review the plan of care with her over the telephone She denies nausea Her bowel habits are regular She denies peripheral neuropathy No recent infection, fever or chills The patient denies any recent signs or symptoms of bleeding such as spontaneous epistaxis, hematuria or hematochezia. She denies cough, chest pain or shortness of breath.  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 Ultrasound irregular hypoechoic mass 7 x 6 x 5 mm    01/06/2012 Initial Biopsy    Initial biopsy was benign . 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 Cervix, biopsy - HIGH GRADE CARCINOMA. - SEE COMMENT. 2. Endocervix, curettage - HIGH GRADE CARCINOMA. - SEE COMMENT. 3. Endometrium, biopsy - HIGH GRADE CARCINOMA. - SEE COMMENT. Microscopic Comment 1. - 3. The carcinoma in the three specimens is morphologically similar and has features suggestive of papillary serous carcinoma.    07/26/2018 Tumor Marker    Patient's tumor was tested for the following markers: CA-125 Results of the tumor marker test revealed 835    07/28/2018 Imaging    1. Several (at least 10) solid pulmonary nodules scattered throughout both lungs are new since 2012 chest CT, likely representing pulmonary metastases, largest 6 mm, below PET resolution. 2. Redemonstration of hypodense 2.5 cm segment 7 right liver lobe mass, indeterminate, suspicious for liver metastasis. 3. 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 4. Three-vessel coronary atherosclerosis.  Aortic Atherosclerosis (ICD10-I70.0).    08/03/2018 Cancer Staging    Staging form: Corpus Uteri - Carcinoma and Carcinosarcoma, AJCC 8th Edition - Clinical: FIGO Stage IVB (cT3b, cN2, cM1) - Signed by [name], MD on 08/03/2018    08/05/2018 Tumor Marker    Patient's tumor was tested for the following markers: CA-125 Results of the tumor marker test revealed 901    08/06/2018 Procedure    Placement of single lumen port a cath via right internal jugular vein. The catheter tip lies at the cavo-atrial junction. A power injectable port a cath was placed and is ready for immediate use.     08/11/2018 -  Chemotherapy    The patient had carboplatin and taxol    09/01/2018 Tumor Marker    Patient's tumor was tested for the following markers: CA-125 Results of the tumor marker test revealed 406    09/09/2018 Genetic Testing    The Common Hereditary Cancers Panel offered by [facility] includes sequencing  and/or deletion duplication testing of the following 48 genes: APC, ATM, AXIN2, BARD1, BMPR1A, BRCA1, BRCA2, BRIP1, CDH1, CDKN2A (p14ARF), CDKN2A (p16INK4a), CKD4, CHEK2, CTNNA1, DICER1, EPCAM (Deletion/duplication testing only), GREM1 (promoter region deletion/duplication testing only), KIT, MEN1, MLH1, MSH2, MSH3, MSH6, MUTYH, NBN, NF1, NHTL1, PALB2, PDGFRA, PMS2, POLD1, POLE, PTEN, RAD50, RAD51C, RAD51D, RNF43, SDHB, SDHC, SDHD, SMAD4, SMARCA4. STK11, TP53, TSC1, TSC2, and VHL.  The following genes were evaluated for sequence changes only: SDHA and HOXB13 c.251G>A variant only.  Results: Negative, no pathogenic variants identified. The report date is 09/09/2018.      09/22/2018 Tumor Marker    Patient's tumor was tested for the following markers: CA-125 Results of the tumor marker test revealed 110     REVIEW OF SYSTEMS:   Constitutional: Denies fevers, chills or abnormal weight loss Eyes: Denies blurriness of vision Ears, nose, mouth, throat, and face: Denies mucositis or sore throat Respiratory: Denies cough, dyspnea or wheezes Cardiovascular: Denies palpitation, chest discomfort or lower extremity swelling Gastrointestinal:  Denies nausea, heartburn or change in bowel habits  Skin: Denies abnormal skin rashes Lymphatics: Denies new lymphadenopathy or easy bruising Neurological:Denies numbness, tingling or new weaknesses Behavioral/Psych: Mood is stable, no new changes  All other systems were reviewed with the patient and are negative.  I have reviewed the past medical history, past surgical history, social history and family history with the patient and they are unchanged from previous note.  ALLERGIES:  has No Known Allergies.  MEDICATIONS:  Current Outpatient Medications  Medication Sig Dispense Refill  . acetaminophen (TYLENOL) 500 MG tablet Take 1 tablet (500 mg total) by mouth every 6 (six) hours as needed. (Patient taking differently: Take 500 mg by mouth every 6 (six) hours as  needed for moderate pain. ) 30 tablet 3  . Calcium Carbonate (CALTRATE 600 PO) Take 1 tablet by mouth 2 (two) times daily.     Marland Kitchen dexamethasone (DECADRON) 4 MG tablet Take 3 tabs the night before and 3 tabs on the morning of chemotherapy every 3 weeks with food 30 tablet 1  . enalapril (VASOTEC) 20 MG tablet Take 1 tablet (20 mg total) by mouth daily. 180 tablet 2  . enoxaparin (LOVENOX) 120 MG/0.8ML injection Inject 0.8 mLs (120 mg total) into the skin every 12 (twelve) hours. 20 Syringe 0  . Fluticasone-Salmeterol (WIXELA INHUB) 100-50 MCG/DOSE AEPB Inhale 1 puff into the lungs daily. 60 each 0  . furosemide (LASIX) 40 MG tablet TAKE 1 TABLET BY MOUTH DAILY AS NEEDED FOR LEG SWELLING 90 tablet 2  . hydrochlorothiazide (HYDRODIURIL) 25 MG tablet TAKE 1 TABLET(25 MG) BY MOUTH DAILY 90 tablet 1  . lidocaine-prilocaine (EMLA) cream Apply to affected area once 30 g 3  . Multiple Vitamin (MULTIVITAMIN) tablet Take 1 tablet by mouth daily.    . Multiple Vitamins-Minerals (ICAPS AREDS 2 PO) Take 1 capsule by mouth 2 (two) times daily.    . Multiple Vitamins-Minerals (WOMENS BONE HEALTH PO) Take 1 tablet by mouth daily.    . ondansetron (ZOFRAN) 8 MG tablet Take 1 tablet (8 mg total) by mouth every 8 (eight) hours as needed. Start on the third day after chemotherapy. 30 tablet 1  . prochlorperazine (COMPAZINE) 10 MG tablet Take 1 tablet (10 mg total) by mouth every 6 (six) hours as needed (Nausea or vomiting). 30 tablet 1  . rivaroxaban (XARELTO) 20 MG TABS tablet Take 1 tablet (20 mg total) by mouth daily. 90 tablet 0  . senna (SENOKOT) 8.6 MG TABS tablet Take 1 tablet (8.6 mg total) by mouth daily. 120 each 0  . Vitamin D, Cholecalciferol, 1000 units CAPS Take by mouth daily.     No current facility-administered medications for this visit.     PHYSICAL EXAMINATION: ECOG PERFORMANCE STATUS: 1 - Symptomatic but completely ambulatory  Vitals:   09/22/18 0929  BP: (!) 154/72  Pulse: 97  Resp: 17   Temp: 98.1 F (36.7 C)  SpO2: 100%   Filed Weights   09/22/18 0929  Weight: 266 lb 9.6 oz (120.9 kg)    GENERAL:alert, no distress and comfortable SKIN: skin color, texture, turgor are normal, no rashes or significant lesions EYES: normal, Conjunctiva are pink and non-injected, sclera clear OROPHARYNX:no exudate, no erythema and lips, buccal mucosa, and tongue normal  NECK: supple, thyroid normal size, non-tender, without nodularity LYMPH:  no palpable lymphadenopathy in the cervical, axillary or inguinal LUNGS: clear to auscultation and percussion with normal breathing effort HEART: regular rate & rhythm and no murmurs and no lower extremity edema ABDOMEN:abdomen soft, non-tender and normal bowel sounds  Musculoskeletal:no cyanosis of digits and no clubbing  NEURO: alert & oriented x 3 with fluent speech, no focal motor/sensory deficits  LABORATORY DATA:  I have reviewed the data as listed    Component Value Date/Time   NA 140 09/22/2018 0906   NA 142 12/25/2014 1122   K 3.7 09/22/2018 0906   K 4.1 12/25/2014 1122   CL 104 09/22/2018 0906   CL 102 10/04/2012 1159   CO2 25 09/22/2018 0906   CO2 29 12/25/2014 1122   GLUCOSE 144 (H) 09/22/2018 0906   GLUCOSE 99 12/25/2014 1122   GLUCOSE 116 (H) 10/04/2012 1159   BUN 13 09/22/2018 0906   BUN 13.7 12/25/2014 1122   CREATININE 1.09 (H) 09/22/2018 0906   CREATININE 0.96 (H) 02/27/2016 1530   CREATININE 0.9 12/25/2014 1122   CALCIUM 9.3 09/22/2018 0906   CALCIUM 10.1 12/25/2014 1122   PROT 7.8 09/22/2018 0906   PROT 7.8 12/25/2014 1122   ALBUMIN 3.2 (L) 09/22/2018 0906   ALBUMIN 3.4 (L) 12/25/2014 1122   AST 18 09/22/2018 0906   AST 21 12/25/2014 1122   ALT 9 09/22/2018 0906   ALT 17 12/25/2014 1122   ALKPHOS 85 09/22/2018 0906   ALKPHOS 99 12/25/2014 1122   BILITOT 0.2 (L) 09/22/2018 0906   BILITOT 0.41 12/25/2014 1122   GFRNONAA 48 (L) 09/22/2018 0906   GFRNONAA 58 (L) 02/27/2016 1530   GFRAA 56 (L) 09/22/2018  0906   GFRAA 66 02/27/2016 1530    No results found for: SPEP, UPEP  Lab Results  Component Value Date   WBC 9.9 09/22/2018   NEUTROABS 9.0 (H) 09/22/2018   HGB 8.6 (L) 09/22/2018   HCT 26.5 (L) 09/22/2018   MCV 94.6 09/22/2018   PLT 96 (L) 09/22/2018      Chemistry      Component Value Date/Time   NA 140 09/22/2018 0906   NA 142 12/25/2014 1122   K 3.7 09/22/2018 0906   K 4.1 12/25/2014 1122   CL 104 09/22/2018 0906   CL 102 10/04/2012 1159   CO2 25 09/22/2018 0906   CO2 29 12/25/2014 1122   BUN 13 09/22/2018 0906   BUN 13.7 12/25/2014 1122   CREATININE 1.09 (H) 09/22/2018 0906   CREATININE 0.96 (H) 02/27/2016 1530   CREATININE 0.9 12/25/2014 1122      Component Value Date/Time   CALCIUM 9.3 09/22/2018 0906   CALCIUM 10.1 12/25/2014 1122   ALKPHOS 85 09/22/2018 0906   ALKPHOS 99 12/25/2014 1122   AST 18 09/22/2018 0906   AST 21 12/25/2014 1122   ALT 9 09/22/2018 0906   ALT 17 12/25/2014 1122   BILITOT 0.2 (L) 09/22/2018 0906   BILITOT 0.41 12/25/2014 1122      All questions were answered. The patient knows to call the clinic with any problems, questions or concerns. No barriers to learning was detected.  I spent 30 minutes counseling the patient face to face. The total time spent in the appointment was 40 minutes and more than 50% was on counseling and review of test results  [name], MD 09/24/2018 9:13 AM

## 2018-09-24 NOTE — Assessment & Plan Note (Addendum)
She has worsening pancytopenia likely due to side effects of chemotherapy I plan dose adjustment. This strategy is discussed with her daughter. Her daughter is educated to watch for signs and symptoms of anemia

## 2018-09-24 NOTE — Assessment & Plan Note (Signed)
She has stable CKD stage III Observe closely for now Planned dose adjustment as above

## 2018-09-27 ENCOUNTER — Telehealth: Payer: Self-pay | Admitting: Hematology and Oncology

## 2018-09-27 NOTE — Telephone Encounter (Signed)
Scheduled 5/12 and 5/13 appts per sch msg. Called patient. No answer. Left msg. Will mail printout

## 2018-09-30 ENCOUNTER — Other Ambulatory Visit: Payer: Self-pay | Admitting: Family Medicine

## 2018-10-08 ENCOUNTER — Telehealth: Payer: Self-pay | Admitting: Oncology

## 2018-10-08 NOTE — Telephone Encounter (Signed)
Ananda called and asked where she can pick up contrast for the CT scan.  Advised her that she can pick it up at the [facility] front desk.  She also asked if anyone will be available to help her over the Radiology waiting room.  Discussed that there is staff available to help her get there.  She verbalized understanding.

## 2018-10-12 ENCOUNTER — Ambulatory Visit (HOSPITAL_COMMUNITY)
Admission: RE | Admit: 2018-10-12 | Discharge: 2018-10-12 | Disposition: A | Discharge status: Home / Self Care | Payer: Medicare Other | Source: Ambulatory Visit | Attending: Hematology and Oncology | Admitting: Hematology and Oncology

## 2018-10-12 ENCOUNTER — Inpatient Hospital Stay: Payer: Medicare Other

## 2018-10-12 ENCOUNTER — Other Ambulatory Visit: Payer: Self-pay

## 2018-10-12 ENCOUNTER — Inpatient Hospital Stay: Payer: Medicare Other | Attending: Gynecologic Oncology

## 2018-10-12 DIAGNOSIS — Z5111 Encounter for antineoplastic chemotherapy: Secondary | ICD-10-CM | POA: Diagnosis not present

## 2018-10-12 DIAGNOSIS — R971 Elevated cancer antigen 125 [CA 125]: Secondary | ICD-10-CM | POA: Diagnosis not present

## 2018-10-12 DIAGNOSIS — Z79899 Other long term (current) drug therapy: Secondary | ICD-10-CM | POA: Insufficient documentation

## 2018-10-12 DIAGNOSIS — C55 Malignant neoplasm of uterus, part unspecified: Secondary | ICD-10-CM | POA: Insufficient documentation

## 2018-10-12 DIAGNOSIS — N183 Chronic kidney disease, stage 3 (moderate): Secondary | ICD-10-CM | POA: Insufficient documentation

## 2018-10-12 DIAGNOSIS — D6481 Anemia due to antineoplastic chemotherapy: Secondary | ICD-10-CM | POA: Diagnosis not present

## 2018-10-12 DIAGNOSIS — Z7951 Long term (current) use of inhaled steroids: Secondary | ICD-10-CM | POA: Diagnosis not present

## 2018-10-12 DIAGNOSIS — C7800 Secondary malignant neoplasm of unspecified lung: Secondary | ICD-10-CM | POA: Diagnosis not present

## 2018-10-12 DIAGNOSIS — Z7901 Long term (current) use of anticoagulants: Secondary | ICD-10-CM | POA: Diagnosis not present

## 2018-10-12 DIAGNOSIS — C787 Secondary malignant neoplasm of liver and intrahepatic bile duct: Secondary | ICD-10-CM | POA: Diagnosis not present

## 2018-10-12 DIAGNOSIS — Z86718 Personal history of other venous thrombosis and embolism: Secondary | ICD-10-CM | POA: Diagnosis not present

## 2018-10-12 DIAGNOSIS — T451X5A Adverse effect of antineoplastic and immunosuppressive drugs, initial encounter: Secondary | ICD-10-CM | POA: Diagnosis not present

## 2018-10-12 DIAGNOSIS — Z86711 Personal history of pulmonary embolism: Secondary | ICD-10-CM | POA: Diagnosis not present

## 2018-10-12 LAB — COMPREHENSIVE METABOLIC PANEL
ALT: 11 U/L (ref 0–44)
AST: 17 U/L (ref 15–41)
Albumin: 3.3 g/dL — ABNORMAL LOW (ref 3.5–5.0)
Alkaline Phosphatase: 81 U/L (ref 38–126)
Anion gap: 11 (ref 5–15)
BUN: 14 mg/dL (ref 8–23)
CO2: 25 mmol/L (ref 22–32)
Calcium: 8.4 mg/dL — ABNORMAL LOW (ref 8.9–10.3)
Chloride: 104 mmol/L (ref 98–111)
Creatinine, Ser: 1 mg/dL (ref 0.44–1.00)
GFR calc Af Amer: >60 mL/min (ref >60)
GFR calc non Af Amer: 54 mL/min — ABNORMAL LOW (ref >60)
Glucose, Bld: 104 mg/dL — ABNORMAL HIGH (ref 70–99)
Potassium: 3.3 mmol/L — ABNORMAL LOW (ref 3.5–5.1)
Sodium: 140 mmol/L (ref 135–145)
Total Bilirubin: 0.2 mg/dL — ABNORMAL LOW (ref 0.3–1.2)
Total Protein: 7.6 g/dL (ref 6.5–8.1)

## 2018-10-12 LAB — CBC WITH DIFFERENTIAL/PLATELET
Abs Immature Granulocytes: 0.08 10*3/uL — ABNORMAL HIGH (ref 0.00–0.07)
Basophils Absolute: 0 10*3/uL (ref 0.0–0.1)
Basophils Relative: 1 %
Eosinophils Absolute: 0 10*3/uL (ref 0.0–0.5)
Eosinophils Relative: 0 %
HCT: 24.8 % — ABNORMAL LOW (ref 36.0–46.0)
Hemoglobin: 8.1 g/dL — ABNORMAL LOW (ref 12.0–15.0)
Immature Granulocytes: 1 %
Lymphocytes Relative: 15 %
Lymphs Abs: 1.3 10*3/uL (ref 0.7–4.0)
MCH: 31.9 pg (ref 26.0–34.0)
MCHC: 32.7 g/dL (ref 30.0–36.0)
MCV: 97.6 fL (ref 80.0–100.0)
Monocytes Absolute: 1.1 10*3/uL — ABNORMAL HIGH (ref 0.1–1.0)
Monocytes Relative: 13 %
Neutro Abs: 6.1 10*3/uL (ref 1.7–7.7)
Neutrophils Relative %: 70 %
Platelets: 200 10*3/uL (ref 150–400)
RBC: 2.54 MIL/uL — ABNORMAL LOW (ref 3.87–5.11)
RDW: 18.6 % — ABNORMAL HIGH (ref 11.5–15.5)
WBC: 8.7 10*3/uL (ref 4.0–10.5)
nRBC: 0 % (ref 0.0–0.2)

## 2018-10-12 MED ORDER — SODIUM CHLORIDE (PF) 0.9 % IJ SOLN
INTRAMUSCULAR | Status: AC
  Filled 2018-10-12: qty 50

## 2018-10-12 MED ORDER — IOHEXOL 300 MG/ML  SOLN
100.0000 mL | Freq: Once | INTRAMUSCULAR | Status: AC | PRN
  Administered 2018-10-12: 11:00:00 100 mL via INTRAVENOUS

## 2018-10-12 MED ORDER — HEPARIN SOD (PORK) LOCK FLUSH 100 UNIT/ML IV SOLN
INTRAVENOUS | Status: AC
  Filled 2018-10-12: qty 5

## 2018-10-12 MED ORDER — HEPARIN SOD (PORK) LOCK FLUSH 100 UNIT/ML IV SOLN
500.0000 [IU] | Freq: Once | INTRAVENOUS | Status: AC
  Administered 2018-10-12: 500 [IU] via INTRAVENOUS

## 2018-10-12 MED ORDER — SODIUM CHLORIDE 0.9% FLUSH
10.0000 mL | Freq: Once | INTRAVENOUS | Status: AC
  Administered 2018-10-12: 10 mL
  Filled 2018-10-12: qty 10

## 2018-10-12 NOTE — Patient Instructions (Signed)

## 2018-10-13 ENCOUNTER — Other Ambulatory Visit: Payer: Self-pay

## 2018-10-13 ENCOUNTER — Inpatient Hospital Stay (HOSPITAL_BASED_OUTPATIENT_CLINIC_OR_DEPARTMENT_OTHER): Payer: Medicare Other | Admitting: Hematology and Oncology

## 2018-10-13 ENCOUNTER — Inpatient Hospital Stay: Payer: Medicare Other

## 2018-10-13 DIAGNOSIS — T451X5A Adverse effect of antineoplastic and immunosuppressive drugs, initial encounter: Secondary | ICD-10-CM

## 2018-10-13 DIAGNOSIS — Z7951 Long term (current) use of inhaled steroids: Secondary | ICD-10-CM | POA: Diagnosis not present

## 2018-10-13 DIAGNOSIS — Z86718 Personal history of other venous thrombosis and embolism: Secondary | ICD-10-CM | POA: Diagnosis not present

## 2018-10-13 DIAGNOSIS — C787 Secondary malignant neoplasm of liver and intrahepatic bile duct: Secondary | ICD-10-CM | POA: Diagnosis not present

## 2018-10-13 DIAGNOSIS — R971 Elevated cancer antigen 125 [CA 125]: Secondary | ICD-10-CM | POA: Diagnosis not present

## 2018-10-13 DIAGNOSIS — Z5111 Encounter for antineoplastic chemotherapy: Secondary | ICD-10-CM | POA: Diagnosis not present

## 2018-10-13 DIAGNOSIS — Z7901 Long term (current) use of anticoagulants: Secondary | ICD-10-CM | POA: Diagnosis not present

## 2018-10-13 DIAGNOSIS — Z79899 Other long term (current) drug therapy: Secondary | ICD-10-CM

## 2018-10-13 DIAGNOSIS — Z86711 Personal history of pulmonary embolism: Secondary | ICD-10-CM

## 2018-10-13 DIAGNOSIS — C7800 Secondary malignant neoplasm of unspecified lung: Secondary | ICD-10-CM | POA: Diagnosis not present

## 2018-10-13 DIAGNOSIS — N183 Chronic kidney disease, stage 3 unspecified: Secondary | ICD-10-CM

## 2018-10-13 DIAGNOSIS — C55 Malignant neoplasm of uterus, part unspecified: Secondary | ICD-10-CM

## 2018-10-13 DIAGNOSIS — Z8672 Personal history of thrombophlebitis: Secondary | ICD-10-CM

## 2018-10-13 DIAGNOSIS — D6481 Anemia due to antineoplastic chemotherapy: Secondary | ICD-10-CM | POA: Diagnosis not present

## 2018-10-13 LAB — CA 125: Cancer Antigen (CA) 125: 55.3 U/mL — ABNORMAL HIGH (ref 0.0–38.1)

## 2018-10-13 MED ORDER — FAMOTIDINE IN NACL 20-0.9 MG/50ML-% IV SOLN
INTRAVENOUS | Status: AC
  Filled 2018-10-13: qty 50

## 2018-10-13 MED ORDER — DIPHENHYDRAMINE HCL 50 MG/ML IJ SOLN
50.0000 mg | Freq: Once | INTRAMUSCULAR | Status: AC
  Administered 2018-10-13: 09:00:00 50 mg via INTRAVENOUS

## 2018-10-13 MED ORDER — HEPARIN SOD (PORK) LOCK FLUSH 100 UNIT/ML IV SOLN
500.0000 [IU] | Freq: Once | INTRAVENOUS | Status: AC | PRN
  Administered 2018-10-13: 15:00:00 500 [IU]
  Filled 2018-10-13: qty 5

## 2018-10-13 MED ORDER — SODIUM CHLORIDE 0.9 % IV SOLN
Freq: Once | INTRAVENOUS | Status: AC
  Administered 2018-10-13: 10:00:00 via INTRAVENOUS
  Filled 2018-10-13: qty 5

## 2018-10-13 MED ORDER — SODIUM CHLORIDE 0.9 % IV SOLN
Freq: Once | INTRAVENOUS | Status: AC
  Administered 2018-10-13: 09:00:00 via INTRAVENOUS
  Filled 2018-10-13: qty 250

## 2018-10-13 MED ORDER — SODIUM CHLORIDE 0.9% FLUSH
10.0000 mL | INTRAVENOUS | Status: DC | PRN
  Administered 2018-10-13: 15:00:00 10 mL
  Filled 2018-10-13: qty 10

## 2018-10-13 MED ORDER — SODIUM CHLORIDE 0.9 % IV SOLN: 564.0000 mg | Freq: Once | INTRAVENOUS | Status: DC

## 2018-10-13 MED ORDER — FAMOTIDINE IN NACL 20-0.9 MG/50ML-% IV SOLN
20.0000 mg | Freq: Once | INTRAVENOUS | Status: AC
  Administered 2018-10-13: 10:00:00 20 mg via INTRAVENOUS

## 2018-10-13 MED ORDER — SODIUM CHLORIDE 0.9 % IV SOLN
140.0000 mg/m2 | Freq: Once | INTRAVENOUS | Status: AC
  Administered 2018-10-13: 11:00:00 336 mg via INTRAVENOUS
  Filled 2018-10-13: qty 56

## 2018-10-13 MED ORDER — PALONOSETRON HCL INJECTION 0.25 MG/5ML
INTRAVENOUS | Status: AC
  Filled 2018-10-13: qty 5

## 2018-10-13 MED ORDER — DEXAMETHASONE 4 MG PO TABS: ORAL_TABLET | ORAL | 1 refills | Status: DC

## 2018-10-13 MED ORDER — PALONOSETRON HCL INJECTION 0.25 MG/5ML
0.2500 mg | Freq: Once | INTRAVENOUS | Status: AC
  Administered 2018-10-13: 0.25 mg via INTRAVENOUS

## 2018-10-13 MED ORDER — DIPHENHYDRAMINE HCL 50 MG/ML IJ SOLN
INTRAMUSCULAR | Status: AC
  Filled 2018-10-13: qty 1

## 2018-10-13 MED ORDER — SODIUM CHLORIDE 0.9 % IV SOLN
451.2000 mg | Freq: Once | INTRAVENOUS | Status: AC
  Administered 2018-10-13: 450 mg via INTRAVENOUS
  Filled 2018-10-13: qty 45

## 2018-10-13 NOTE — Progress Notes (Signed)
Confirmed carboplatin dose of AUC = 4 w/ MD. Orders updated to reflect this.   Demetrius Charity, PharmD, [place] [facility] Pharmacist Pharmacy Phone: [phone] 10/13/2018

## 2018-10-13 NOTE — Patient Instructions (Signed)
[facility] Discharge Instructions for Patients Receiving Chemotherapy  Today you received the following chemotherapy agents Paclitaxel (TAXOL) & Carboplatin (PARAPLATIN).  To help prevent nausea and vomiting after your treatment, we encourage you to take your nausea medication as prescribed.   If you develop nausea and vomiting that is not controlled by your nausea medication, call the clinic.   BELOW ARE SYMPTOMS THAT SHOULD BE REPORTED IMMEDIATELY:  *FEVER GREATER THAN 100.5 F  *CHILLS WITH OR WITHOUT FEVER  NAUSEA AND VOMITING THAT IS NOT CONTROLLED WITH YOUR NAUSEA MEDICATION  *UNUSUAL SHORTNESS OF BREATH  *UNUSUAL BRUISING OR BLEEDING  TENDERNESS IN MOUTH AND THROAT WITH OR WITHOUT PRESENCE OF ULCERS  *URINARY PROBLEMS  *BOWEL PROBLEMS  UNUSUAL RASH Items with * indicate a potential emergency and should be followed up as soon as possible.  Feel free to call the clinic should you have any questions or concerns. The clinic phone number is [phone].  Please show the CHEMO ALERT CARD at check-in to the Emergency Department and triage nurse.  Coronavirus (COVID-19) Are you at risk?  Are you at risk for the Coronavirus (COVID-19)?  To be considered HIGH RISK for Coronavirus (COVID-19), you have to meet the following criteria:  . Traveled to [place], [place], [place], [place] or [place]; or in the [place] to [place], [place], [place], or [place]; and have fever, cough, and shortness of breath within the last 2 weeks of travel OR . Been in close contact with a person diagnosed with COVID-19 within the last 2 weeks and have fever, cough, and shortness of breath . IF YOU DO NOT MEET THESE CRITERIA, YOU ARE CONSIDERED LOW RISK FOR COVID-19.  What to do if you are HIGH RISK for COVID-19?  . If you are having a medical emergency, call 911. . Seek medical care right away. Before you go to a doctor's office, urgent care or emergency department,  call ahead and tell them about your recent travel, contact with someone diagnosed with COVID-19, and your symptoms. You should receive instructions from your physician's office regarding next steps of care.  . When you arrive at healthcare provider, tell the healthcare staff immediately you have returned from visiting [place], [place], [place], [place] or [place]; or traveled in the [place] to [place], [place], [place], or [place]; in the last two weeks or you have been in close contact with a person diagnosed with COVID-19 in the last 2 weeks.   . Tell the health care staff about your symptoms: fever, cough and shortness of breath. . After you have been seen by a medical provider, you will be either: o Tested for (COVID-19) and discharged home on quarantine except to seek medical care if symptoms worsen, and asked to  - Stay home and avoid contact with others until you get your results (4-5 days)  - Avoid travel on public transportation if possible (such as bus, train, or airplane) or o Sent to the Emergency Department by EMS for evaluation, COVID-19 testing, and possible admission depending on your condition and test results.  What to do if you are LOW RISK for COVID-19?  Reduce your risk of any infection by using the same precautions used for avoiding the common cold or flu:  . Wash your hands often with soap and warm water for at least 20 seconds.  If soap and water are not readily available, use an alcohol-based hand sanitizer with at least 60% alcohol.  .   If coughing or sneezing, cover your mouth and nose by coughing or sneezing into the elbow areas of your shirt or coat, into a tissue or into your sleeve (not your hands). . Avoid shaking hands with others and consider head nods or verbal greetings only. . Avoid touching your eyes, nose, or mouth with unwashed hands.  . Avoid close contact with people who are sick. . Avoid places or events with large numbers of people in one  location, like concerts or sporting events. . Carefully consider travel plans you have or are making. . If you are planning any travel outside or inside the [place], visit the CDC's Travelers' Health webpage for the latest health notices. . If you have some symptoms but not all symptoms, continue to monitor at home and seek medical attention if your symptoms worsen. . If you are having a medical emergency, call 911.   ADDITIONAL HEALTHCARE OPTIONS FOR PATIENTS  [facility] / e-Visit: [email or web address]         [facility]: [phone]  [facility]: [phone]                   [facility]: [phone]   

## 2018-10-14 ENCOUNTER — Encounter: Payer: Self-pay | Admitting: Hematology and Oncology

## 2018-10-14 ENCOUNTER — Other Ambulatory Visit: Payer: Self-pay | Admitting: Family Medicine

## 2018-10-14 NOTE — Assessment & Plan Note (Signed)
She is not symptomatic We will proceed with treatment We will monitor her blood count carefully.

## 2018-10-14 NOTE — Assessment & Plan Note (Signed)
She has stable CKD stage III Observe closely for now Planned dose adjustment as above

## 2018-10-14 NOTE — Assessment & Plan Note (Signed)
I have reviewed imaging study with the patient and family She has positive response to treatment So far, she tolerated treatment very well The plan would be to complete 6 cycles of treatment before repeat imaging study.

## 2018-10-14 NOTE — Progress Notes (Signed)
[place] OFFICE PROGRESS NOTE  Patient Care Team: Lovenia Kim, MD as PCP - General Erin Jacks, MD as Consulting Physician (Ophthalmology) Erin Esparza, DPM as Consulting Physician (Podiatry) Erin Esparza, Erin Freud, MD as Referring Physician (Optometry) [name], MD as Consulting Physician (Orthopedic Surgery) [name], RN as Oncology Nurse Navigator (Oncology)  ASSESSMENT & PLAN:  Uterine cancer Erin Esparza) I have reviewed imaging study with the patient and family She has positive response to treatment So far, she tolerated treatment very well The plan would be to complete 6 cycles of treatment before repeat imaging study.  Anemia due to antineoplastic chemotherapy She is not symptomatic We will proceed with treatment We will monitor her blood count carefully.  CKD (chronic kidney disease), stage III ([place]) She has stable CKD stage III Observe closely for now Planned dose adjustment as above  DEEP VENOUS THROMBOPHLEBITIS, HX OF She is on chronic anticoagulation therapy due to history of blood clots    Orders Placed This Encounter  Procedures  . Sample to Blood Bank    Standing Status:   Standing    Number of Occurrences:   33    Standing Expiration Date:   10/14/2019    INTERVAL HISTORY: Please see below for problem oriented charting. She returns for cycle 4 of chemotherapy and review of CT report She tolerated last cycle of treatment well Denies peripheral neuropathy No nausea, vomiting or changes in bowel habits The patient denies any recent signs or symptoms of bleeding such as spontaneous epistaxis, hematuria or hematochezia.   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 Ultrasound irregular hypoechoic mass 7 x 6 x 5 mm    01/06/2012 Initial Biopsy    Initial  biopsy was benign . 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 Cervix, biopsy - HIGH GRADE CARCINOMA. - SEE COMMENT. 2. Endocervix, curettage - HIGH GRADE CARCINOMA. - SEE COMMENT. 3. Endometrium, biopsy - HIGH GRADE CARCINOMA. - SEE COMMENT. Microscopic Comment 1. - 3. The carcinoma in the three specimens is morphologically similar and has features suggestive of papillary serous carcinoma.    07/26/2018 Tumor Marker    Patient's tumor was tested for the following markers: CA-125 Results of the tumor marker test revealed 835    07/28/2018 Imaging    1. Several (at least 10) solid pulmonary nodules scattered throughout both lungs are new since 2012 chest CT, likely representing pulmonary metastases, largest 6 mm, below PET resolution. 2. Redemonstration of  hypodense 2.5 cm segment 7 right liver lobe mass, indeterminate, suspicious for liver metastasis. 3. 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 4. Three-vessel coronary atherosclerosis.  Aortic Atherosclerosis (ICD10-I70.0).    08/03/2018 Cancer Staging    Staging form: Corpus Uteri - Carcinoma and Carcinosarcoma, AJCC 8th Edition - Clinical: FIGO Stage IVB (cT3b, cN2, cM1) - Signed by [name],  MD on 08/03/2018    08/05/2018 Tumor Marker    Patient's tumor was tested for the following markers: CA-125 Results of the tumor marker test revealed 901    08/06/2018 Procedure    Placement of single lumen port a cath via right internal jugular vein. The catheter tip lies at the cavo-atrial junction. A power injectable port a cath was placed and is ready for immediate use.     08/11/2018 -  Chemotherapy    The patient had carboplatin and taxol    09/01/2018 Tumor Marker    Patient's tumor was tested for the following markers: CA-125 Results of the tumor marker test revealed 406    09/09/2018 Genetic Testing    The Common Hereditary Cancers Panel offered by [facility] includes sequencing and/or deletion duplication testing of the following 48 genes: APC, ATM, AXIN2, BARD1, BMPR1A, BRCA1, BRCA2, BRIP1, CDH1, CDKN2A (p14ARF), CDKN2A (p16INK4a), CKD4, CHEK2, CTNNA1, DICER1, EPCAM (Deletion/duplication testing only), GREM1 (promoter region deletion/duplication testing only), KIT, MEN1, MLH1, MSH2, MSH3, MSH6, MUTYH, NBN, NF1, NHTL1, PALB2, PDGFRA, PMS2, POLD1, POLE, PTEN, RAD50, RAD51C, RAD51D, RNF43, SDHB, SDHC, SDHD, SMAD4, SMARCA4. STK11, TP53, TSC1, TSC2, and VHL.  The following genes were evaluated for sequence changes only: SDHA and HOXB13 c.251G>A variant only.  Results: Negative, no pathogenic variants identified. The report date is 09/09/2018.      09/22/2018 Tumor Marker    Patient's tumor was tested for the following markers: CA-125 Results of the tumor marker test revealed 110    10/12/2018 Tumor Marker    Patient's tumor was tested for the following markers: CA-125 Results of the tumor marker test revealed 55.3    10/12/2018 Imaging    1. Mild decrease in pulmonary metastases, liver metastases, and metastatic abdominal and pelvic lymphadenopathy. 2. Decreased size of complex cystic lesion in left presacral region. 3. No new or progressive metastatic disease identified. 4. Stable small right  inguinal hernia containing small portion of urinary bladder.     REVIEW OF SYSTEMS:   Constitutional: Denies fevers, chills or abnormal weight loss Eyes: Denies blurriness of vision Ears, nose, mouth, throat, and face: Denies mucositis or sore throat Respiratory: Denies cough, dyspnea or wheezes Cardiovascular: Denies palpitation, chest discomfort or lower extremity swelling Gastrointestinal:  Denies nausea, heartburn or change in bowel habits Skin: Denies abnormal skin rashes Lymphatics: Denies new lymphadenopathy or easy bruising Neurological:Denies numbness, tingling or new weaknesses Behavioral/Psych: Mood is stable, no new changes  All other systems were reviewed with the patient and are negative.  I have reviewed the past medical history, past surgical history, social history and family history with the patient and they are unchanged from previous note.  ALLERGIES:  has No Known Allergies.  MEDICATIONS:  Current Outpatient Medications  Medication Sig Dispense Refill  . acetaminophen (TYLENOL) 500 MG tablet Take 1 tablet (500 mg total) by mouth every 6 (six) hours as needed. (Patient taking differently: Take 500 mg by mouth every 6 (six) hours as needed for moderate pain. ) 30 tablet 3  . ADVAIR DISKUS 100-50 MCG/DOSE AEPB INHALE 1 PUFF INTO THE LUNGS DAILY 60 each 0  .  Calcium Carbonate (CALTRATE 600 PO) Take 1 tablet by mouth 2 (two) times daily.     Marland Kitchen dexamethasone (DECADRON) 4 MG tablet Take 2 tabs the night before and 2 tabs on the morning of chemotherapy every 3 weeks with food 12 tablet 1  . enalapril (VASOTEC) 20 MG tablet Take 1 tablet (20 mg total) by mouth daily. 180 tablet 2  . furosemide (LASIX) 40 MG tablet TAKE 1 TABLET BY MOUTH DAILY AS NEEDED FOR LEG SWELLING 90 tablet 2  . hydrochlorothiazide (HYDRODIURIL) 25 MG tablet TAKE 1 TABLET(25 MG) BY MOUTH DAILY 90 tablet 1  . lidocaine-prilocaine (EMLA) cream Apply to affected area once 30 g 3  . Multiple Vitamin  (MULTIVITAMIN) tablet Take 1 tablet by mouth daily.    . Multiple Vitamins-Minerals (ICAPS AREDS 2 PO) Take 1 capsule by mouth 2 (two) times daily.    . Multiple Vitamins-Minerals (WOMENS BONE HEALTH PO) Take 1 tablet by mouth daily.    . ondansetron (ZOFRAN) 8 MG tablet Take 1 tablet (8 mg total) by mouth every 8 (eight) hours as needed. Start on the third day after chemotherapy. 30 tablet 1  . prochlorperazine (COMPAZINE) 10 MG tablet Take 1 tablet (10 mg total) by mouth every 6 (six) hours as needed (Nausea or vomiting). 30 tablet 1  . rivaroxaban (XARELTO) 20 MG TABS tablet Take 1 tablet (20 mg total) by mouth daily. 90 tablet 0  . senna (SENOKOT) 8.6 MG TABS tablet Take 1 tablet (8.6 mg total) by mouth daily. 120 each 0  . Vitamin D, Cholecalciferol, 1000 units CAPS Take by mouth daily.     No current facility-administered medications for this visit.     PHYSICAL EXAMINATION: ECOG PERFORMANCE STATUS: 1 - Symptomatic but completely ambulatory  Vitals:   10/13/18 0843  BP: (!) 141/74  Pulse: 94  Resp: 18  Temp: 97.8 F (36.6 C)  SpO2: 100%   Filed Weights   10/13/18 0843  Weight: 262 lb (118.8 kg)    GENERAL:alert, no distress and comfortable SKIN: skin color, texture, turgor are normal, no rashes or significant lesions EYES: normal, Conjunctiva are pink and non-injected, sclera clear OROPHARYNX:no exudate, no erythema and lips, buccal mucosa, and tongue normal  NECK: supple, thyroid normal size, non-tender, without nodularity LYMPH:  no palpable lymphadenopathy in the cervical, axillary or inguinal LUNGS: clear to auscultation and percussion with normal breathing effort HEART: regular rate & rhythm and no murmurs and no lower extremity edema ABDOMEN:abdomen soft, non-tender and normal bowel sounds Musculoskeletal:no cyanosis of digits and no clubbing  NEURO: alert & oriented x 3 with fluent speech, no focal motor/sensory deficits  LABORATORY DATA:  I have reviewed the  data as listed    Component Value Date/Time   NA 140 10/12/2018 0846   NA 142 12/25/2014 1122   K 3.3 (L) 10/12/2018 0846   K 4.1 12/25/2014 1122   CL 104 10/12/2018 0846   CL 102 10/04/2012 1159   CO2 25 10/12/2018 0846   CO2 29 12/25/2014 1122   GLUCOSE 104 (H) 10/12/2018 0846   GLUCOSE 99 12/25/2014 1122   GLUCOSE 116 (H) 10/04/2012 1159   BUN 14 10/12/2018 0846   BUN 13.7 12/25/2014 1122   CREATININE 1.00 10/12/2018 0846   CREATININE 0.96 (H) 02/27/2016 1530   CREATININE 0.9 12/25/2014 1122   CALCIUM 8.4 (L) 10/12/2018 0846   CALCIUM 10.1 12/25/2014 1122   PROT 7.6 10/12/2018 0846   PROT 7.8 12/25/2014 1122   ALBUMIN 3.3 (L)  10/12/2018 0846   ALBUMIN 3.4 (L) 12/25/2014 1122   AST 17 10/12/2018 0846   AST 21 12/25/2014 1122   ALT 11 10/12/2018 0846   ALT 17 12/25/2014 1122   ALKPHOS 81 10/12/2018 0846   ALKPHOS 99 12/25/2014 1122   BILITOT 0.2 (L) 10/12/2018 0846   BILITOT 0.41 12/25/2014 1122   GFRNONAA 54 (L) 10/12/2018 0846   GFRNONAA 58 (L) 02/27/2016 1530   GFRAA >60 10/12/2018 0846   GFRAA 66 02/27/2016 1530    No results found for: SPEP, UPEP  Lab Results  Component Value Date   WBC 8.7 10/12/2018   NEUTROABS 6.1 10/12/2018   HGB 8.1 (L) 10/12/2018   HCT 24.8 (L) 10/12/2018   MCV 97.6 10/12/2018   PLT 200 10/12/2018      Chemistry      Component Value Date/Time   NA 140 10/12/2018 0846   NA 142 12/25/2014 1122   K 3.3 (L) 10/12/2018 0846   K 4.1 12/25/2014 1122   CL 104 10/12/2018 0846   CL 102 10/04/2012 1159   CO2 25 10/12/2018 0846   CO2 29 12/25/2014 1122   BUN 14 10/12/2018 0846   BUN 13.7 12/25/2014 1122   CREATININE 1.00 10/12/2018 0846   CREATININE 0.96 (H) 02/27/2016 1530   CREATININE 0.9 12/25/2014 1122      Component Value Date/Time   CALCIUM 8.4 (L) 10/12/2018 0846   CALCIUM 10.1 12/25/2014 1122   ALKPHOS 81 10/12/2018 0846   ALKPHOS 99 12/25/2014 1122   AST 17 10/12/2018 0846   AST 21 12/25/2014 1122   ALT 11 10/12/2018  0846   ALT 17 12/25/2014 1122   BILITOT 0.2 (L) 10/12/2018 0846   BILITOT 0.41 12/25/2014 1122       RADIOGRAPHIC STUDIES: I have reviewed imaging study with the patient and her daughter over the telephone I have personally reviewed the radiological images as listed and agreed with the findings in the report. Ct Chest W Contrast  Result Date: 10/12/2018 CLINICAL DATA:  Follow-up endometrial carcinoma. EXAM: CT CHEST, ABDOMEN, AND PELVIS WITH CONTRAST TECHNIQUE: Multidetector CT imaging of the chest, abdomen and pelvis was performed following the standard protocol during bolus administration of intravenous contrast. CONTRAST:  128m OMNIPAQUE IOHEXOL 300 MG/ML  SOLN COMPARISON:  Chest CT on 07/28/2018, and AP CT on 07/21/2018 FINDINGS: CT CHEST FINDINGS Cardiovascular: No acute findings. Aortic and coronary artery atherosclerosis. Mediastinum/Lymph Nodes: No masses or pathologically enlarged lymph nodes identified. Lungs/Pleura: Multiple sub-cm pulmonary nodules are again seen in both lungs. Several nodules in both upper lobes show decrease in size, with index nodule in the posterolateral right upper lobe measuring 3 mm on image 48/6, compared to 6 mm previously. Multiple other sub-cm nodules show no change in size. No new or enlarging pulmonary nodules or masses identified. No evidence of pulmonary infiltrate or pleural effusion. Musculoskeletal:  No suspicious bone lesions identified. CT ABDOMEN AND PELVIS FINDINGS Hepatobiliary: A 1.2 cm hypovascular lesion in the posterior liver dome has decreased in size from 2.0 cm on previous study. Other tiny sub-cm low-attenuation lesions remain stable, some which are too small to characterize and some representing tiny cysts. No new or enlarging liver masses identified. Gallbladder is unremarkable. No evidence of biliary ductal dilatation. Pancreas:  No mass or inflammatory changes. Spleen:  Within normal limits in size and appearance. Adrenals/Urinary tract: No  masses or hydronephrosis. Small simple left renal cyst again noted. Stomach/Bowel: No evidence of obstruction, inflammatory process, or abnormal fluid collections. Vascular/Lymphatic:  Abdominal retroperitoneal lymphadenopathy in the left paraaortic region shows mild improvement, with index lymph node measuring 14 mm on image 68/2, compared to 2.2 cm previously. Mild bilateral common and external iliac lymphadenopathy has also decreased, with index lymph node in the left external iliac chain measuring 1.1 cm on image 106/2, compared to 1 point 5 cm previously. No new or increased lymphadenopathy identified. Reproductive: An ovoid cystic lesion with irregular rim thickening enhancement in the left presacral region currently measures 5.1 x 3.2 cm, compared to 8.2 x 6.7 cm previously. No new or enlarging pelvic masses identified. No evidence of inflammatory process or ascites. Other: Stable small right inguinal hernia containing a small portion of the urinary bladder. No evidence of herniated bowel loops. Musculoskeletal:  No suspicious bone lesions identified. IMPRESSION: 1. Mild decrease in pulmonary metastases, liver metastases, and metastatic abdominal and pelvic lymphadenopathy. 2. Decreased size of complex cystic lesion in left presacral region. 3. No new or progressive metastatic disease identified. 4. Stable small right inguinal hernia containing small portion of urinary bladder. Electronically Signed   By: Earle Gell M.D.   On: 10/12/2018 15:49   Ct Abdomen Pelvis W Contrast  Result Date: 10/12/2018 CLINICAL DATA:  Follow-up endometrial carcinoma. EXAM: CT CHEST, ABDOMEN, AND PELVIS WITH CONTRAST TECHNIQUE: Multidetector CT imaging of the chest, abdomen and pelvis was performed following the standard protocol during bolus administration of intravenous contrast. CONTRAST:  158m OMNIPAQUE IOHEXOL 300 MG/ML  SOLN COMPARISON:  Chest CT on 07/28/2018, and AP CT on 07/21/2018 FINDINGS: CT CHEST FINDINGS  Cardiovascular: No acute findings. Aortic and coronary artery atherosclerosis. Mediastinum/Lymph Nodes: No masses or pathologically enlarged lymph nodes identified. Lungs/Pleura: Multiple sub-cm pulmonary nodules are again seen in both lungs. Several nodules in both upper lobes show decrease in size, with index nodule in the posterolateral right upper lobe measuring 3 mm on image 48/6, compared to 6 mm previously. Multiple other sub-cm nodules show no change in size. No new or enlarging pulmonary nodules or masses identified. No evidence of pulmonary infiltrate or pleural effusion. Musculoskeletal:  No suspicious bone lesions identified. CT ABDOMEN AND PELVIS FINDINGS Hepatobiliary: A 1.2 cm hypovascular lesion in the posterior liver dome has decreased in size from 2.0 cm on previous study. Other tiny sub-cm low-attenuation lesions remain stable, some which are too small to characterize and some representing tiny cysts. No new or enlarging liver masses identified. Gallbladder is unremarkable. No evidence of biliary ductal dilatation. Pancreas:  No mass or inflammatory changes. Spleen:  Within normal limits in size and appearance. Adrenals/Urinary tract: No masses or hydronephrosis. Small simple left renal cyst again noted. Stomach/Bowel: No evidence of obstruction, inflammatory process, or abnormal fluid collections. Vascular/Lymphatic: Abdominal retroperitoneal lymphadenopathy in the left paraaortic region shows mild improvement, with index lymph node measuring 14 mm on image 68/2, compared to 2.2 cm previously. Mild bilateral common and external iliac lymphadenopathy has also decreased, with index lymph node in the left external iliac chain measuring 1.1 cm on image 106/2, compared to 1 point 5 cm previously. No new or increased lymphadenopathy identified. Reproductive: An ovoid cystic lesion with irregular rim thickening enhancement in the left presacral region currently measures 5.1 x 3.2 cm, compared to 8.2 x  6.7 cm previously. No new or enlarging pelvic masses identified. No evidence of inflammatory process or ascites. Other: Stable small right inguinal hernia containing a small portion of the urinary bladder. No evidence of herniated bowel loops. Musculoskeletal:  No suspicious bone lesions identified. IMPRESSION: 1. Mild decrease  in pulmonary metastases, liver metastases, and metastatic abdominal and pelvic lymphadenopathy. 2. Decreased size of complex cystic lesion in left presacral region. 3. No new or progressive metastatic disease identified. 4. Stable small right inguinal hernia containing small portion of urinary bladder. Electronically Signed   By: Earle Gell M.D.   On: 10/12/2018 15:49    All questions were answered. The patient knows to call the clinic with any problems, questions or concerns. No barriers to learning was detected.  I spent 30 minutes counseling the patient face to face. The total time spent in the appointment was 40 minutes and more than 50% was on counseling and review of test results  [name], MD 10/14/2018 1:57 PM

## 2018-10-14 NOTE — Assessment & Plan Note (Signed)
She is on chronic anticoagulation therapy due to history of blood clots

## 2018-10-18 ENCOUNTER — Telehealth: Payer: Self-pay | Admitting: Hematology and Oncology

## 2018-10-18 NOTE — Telephone Encounter (Signed)
Scheduled appts per sch msg. Called and spoke with patients daughter. Confirmed dates and times

## 2018-11-01 ENCOUNTER — Other Ambulatory Visit: Payer: Self-pay | Admitting: Hematology and Oncology

## 2018-11-02 ENCOUNTER — Telehealth: Payer: Self-pay | Admitting: Oncology

## 2018-11-02 NOTE — Telephone Encounter (Signed)
Requested Her2/neu testing on Accession: [id number] with Suanne Marker in [facility].

## 2018-11-03 ENCOUNTER — Inpatient Hospital Stay: Payer: Medicare Other

## 2018-11-03 ENCOUNTER — Inpatient Hospital Stay: Payer: Medicare Other | Attending: Gynecologic Oncology | Admitting: Hematology and Oncology

## 2018-11-03 ENCOUNTER — Other Ambulatory Visit: Payer: Self-pay

## 2018-11-03 ENCOUNTER — Encounter: Payer: Self-pay | Admitting: Hematology and Oncology

## 2018-11-03 ENCOUNTER — Encounter: Payer: Self-pay | Admitting: General Practice

## 2018-11-03 VITALS — HR 88

## 2018-11-03 DIAGNOSIS — C787 Secondary malignant neoplasm of liver and intrahepatic bile duct: Secondary | ICD-10-CM | POA: Insufficient documentation

## 2018-11-03 DIAGNOSIS — D6481 Anemia due to antineoplastic chemotherapy: Secondary | ICD-10-CM

## 2018-11-03 DIAGNOSIS — Z5111 Encounter for antineoplastic chemotherapy: Secondary | ICD-10-CM | POA: Diagnosis not present

## 2018-11-03 DIAGNOSIS — C55 Malignant neoplasm of uterus, part unspecified: Secondary | ICD-10-CM

## 2018-11-03 DIAGNOSIS — K4090 Unilateral inguinal hernia, without obstruction or gangrene, not specified as recurrent: Secondary | ICD-10-CM | POA: Insufficient documentation

## 2018-11-03 DIAGNOSIS — D61818 Other pancytopenia: Secondary | ICD-10-CM | POA: Insufficient documentation

## 2018-11-03 DIAGNOSIS — C541 Malignant neoplasm of endometrium: Secondary | ICD-10-CM | POA: Diagnosis present

## 2018-11-03 DIAGNOSIS — N183 Chronic kidney disease, stage 3 unspecified: Secondary | ICD-10-CM

## 2018-11-03 DIAGNOSIS — I129 Hypertensive chronic kidney disease with stage 1 through stage 4 chronic kidney disease, or unspecified chronic kidney disease: Secondary | ICD-10-CM | POA: Insufficient documentation

## 2018-11-03 DIAGNOSIS — T451X5A Adverse effect of antineoplastic and immunosuppressive drugs, initial encounter: Secondary | ICD-10-CM

## 2018-11-03 DIAGNOSIS — C7800 Secondary malignant neoplasm of unspecified lung: Secondary | ICD-10-CM | POA: Diagnosis not present

## 2018-11-03 DIAGNOSIS — I10 Essential (primary) hypertension: Secondary | ICD-10-CM

## 2018-11-03 LAB — CBC WITH DIFFERENTIAL/PLATELET
Abs Immature Granulocytes: 0.14 10*3/uL — ABNORMAL HIGH (ref 0.00–0.07)
Basophils Absolute: 0 10*3/uL (ref 0.0–0.1)
Basophils Relative: 0 %
Eosinophils Absolute: 0 10*3/uL (ref 0.0–0.5)
Eosinophils Relative: 0 %
HCT: 24.7 % — ABNORMAL LOW (ref 36.0–46.0)
Hemoglobin: 8.1 g/dL — ABNORMAL LOW (ref 12.0–15.0)
Immature Granulocytes: 1 %
Lymphocytes Relative: 5 %
Lymphs Abs: 0.7 10*3/uL (ref 0.7–4.0)
MCH: 32.7 pg (ref 26.0–34.0)
MCHC: 32.8 g/dL (ref 30.0–36.0)
MCV: 99.6 fL (ref 80.0–100.0)
Monocytes Absolute: 0.4 10*3/uL (ref 0.1–1.0)
Monocytes Relative: 3 %
Neutro Abs: 11.5 10*3/uL — ABNORMAL HIGH (ref 1.7–7.7)
Neutrophils Relative %: 91 %
Platelets: 186 10*3/uL (ref 150–400)
RBC: 2.48 MIL/uL — ABNORMAL LOW (ref 3.87–5.11)
RDW: 17.8 % — ABNORMAL HIGH (ref 11.5–15.5)
WBC: 12.7 10*3/uL — ABNORMAL HIGH (ref 4.0–10.5)
nRBC: 0 % (ref 0.0–0.2)

## 2018-11-03 LAB — COMPREHENSIVE METABOLIC PANEL
ALT: 8 U/L (ref 0–44)
AST: 19 U/L (ref 15–41)
Albumin: 3.2 g/dL — ABNORMAL LOW (ref 3.5–5.0)
Alkaline Phosphatase: 88 U/L (ref 38–126)
Anion gap: 14 (ref 5–15)
BUN: 15 mg/dL (ref 8–23)
CO2: 22 mmol/L (ref 22–32)
Calcium: 10 mg/dL (ref 8.9–10.3)
Chloride: 102 mmol/L (ref 98–111)
Creatinine, Ser: 1.05 mg/dL — ABNORMAL HIGH (ref 0.44–1.00)
GFR calc Af Amer: 58 mL/min — ABNORMAL LOW (ref >60)
GFR calc non Af Amer: 50 mL/min — ABNORMAL LOW (ref >60)
Glucose, Bld: 142 mg/dL — ABNORMAL HIGH (ref 70–99)
Potassium: 4 mmol/L (ref 3.5–5.1)
Sodium: 138 mmol/L (ref 135–145)
Total Bilirubin: 0.2 mg/dL — ABNORMAL LOW (ref 0.3–1.2)
Total Protein: 7.8 g/dL (ref 6.5–8.1)

## 2018-11-03 LAB — SAMPLE TO BLOOD BANK

## 2018-11-03 MED ORDER — HEPARIN SOD (PORK) LOCK FLUSH 100 UNIT/ML IV SOLN
500.0000 [IU] | Freq: Once | INTRAVENOUS | Status: AC | PRN
  Administered 2018-11-03: 500 [IU]
  Filled 2018-11-03: qty 5

## 2018-11-03 MED ORDER — SODIUM CHLORIDE 0.9% FLUSH
10.0000 mL | Freq: Once | INTRAVENOUS | Status: AC
  Administered 2018-11-03: 10 mL
  Filled 2018-11-03: qty 10

## 2018-11-03 MED ORDER — FAMOTIDINE IN NACL 20-0.9 MG/50ML-% IV SOLN
20.0000 mg | Freq: Once | INTRAVENOUS | Status: AC
  Administered 2018-11-03: 20 mg via INTRAVENOUS

## 2018-11-03 MED ORDER — SODIUM CHLORIDE 0.9 % IV SOLN
140.0000 mg/m2 | Freq: Once | INTRAVENOUS | Status: AC
  Administered 2018-11-03: 336 mg via INTRAVENOUS
  Filled 2018-11-03: qty 56

## 2018-11-03 MED ORDER — SODIUM CHLORIDE 0.9% FLUSH
10.0000 mL | INTRAVENOUS | Status: DC | PRN
  Administered 2018-11-03: 10 mL
  Filled 2018-11-03: qty 10

## 2018-11-03 MED ORDER — SODIUM CHLORIDE 0.9 % IV SOLN
Freq: Once | INTRAVENOUS | Status: AC
  Administered 2018-11-03: 11:00:00 via INTRAVENOUS
  Filled 2018-11-03: qty 5

## 2018-11-03 MED ORDER — DIPHENHYDRAMINE HCL 50 MG/ML IJ SOLN
INTRAMUSCULAR | Status: AC
  Filled 2018-11-03: qty 1

## 2018-11-03 MED ORDER — PALONOSETRON HCL INJECTION 0.25 MG/5ML
INTRAVENOUS | Status: AC
  Filled 2018-11-03: qty 5

## 2018-11-03 MED ORDER — DIPHENHYDRAMINE HCL 50 MG/ML IJ SOLN
50.0000 mg | Freq: Once | INTRAMUSCULAR | Status: AC
  Administered 2018-11-03: 50 mg via INTRAVENOUS

## 2018-11-03 MED ORDER — PALONOSETRON HCL INJECTION 0.25 MG/5ML
0.2500 mg | Freq: Once | INTRAVENOUS | Status: AC
  Administered 2018-11-03: 0.25 mg via INTRAVENOUS

## 2018-11-03 MED ORDER — SODIUM CHLORIDE 0.9 % IV SOLN
Freq: Once | INTRAVENOUS | Status: AC
  Administered 2018-11-03: 11:00:00 via INTRAVENOUS
  Filled 2018-11-03: qty 250

## 2018-11-03 MED ORDER — SODIUM CHLORIDE 0.9 % IV SOLN
450.0000 mg | Freq: Once | INTRAVENOUS | Status: AC
  Administered 2018-11-03: 450 mg via INTRAVENOUS
  Filled 2018-11-03: qty 45

## 2018-11-03 MED ORDER — FAMOTIDINE IN NACL 20-0.9 MG/50ML-% IV SOLN
INTRAVENOUS | Status: AC
  Filled 2018-11-03: qty 50

## 2018-11-03 NOTE — Assessment & Plan Note (Signed)
She is not symptomatic We will proceed with treatment We will monitor her blood count carefully.

## 2018-11-03 NOTE — Assessment & Plan Note (Signed)
She has stable CKD stage III Observe closely for now Planned dose adjustment as before but I plan to keep carboplatinum dose at 420 mg

## 2018-11-03 NOTE — Assessment & Plan Note (Signed)
Her blood pressure fluctuated up and down She is symptomatic with fluid retention She will continue diuretic therapy

## 2018-11-03 NOTE — Patient Instructions (Signed)
   [facility] Discharge Instructions for Patients Receiving Chemotherapy  Today you received the following chemotherapy agents Taxol and Carboplatin   To help prevent nausea and vomiting after your treatment, we encourage you to take your nausea medication as directed.    If you develop nausea and vomiting that is not controlled by your nausea medication, call the clinic.   BELOW ARE SYMPTOMS THAT SHOULD BE REPORTED IMMEDIATELY:  *FEVER GREATER THAN 100.5 F  *CHILLS WITH OR WITHOUT FEVER  NAUSEA AND VOMITING THAT IS NOT CONTROLLED WITH YOUR NAUSEA MEDICATION  *UNUSUAL SHORTNESS OF BREATH  *UNUSUAL BRUISING OR BLEEDING  TENDERNESS IN MOUTH AND THROAT WITH OR WITHOUT PRESENCE OF ULCERS  *URINARY PROBLEMS  *BOWEL PROBLEMS  UNUSUAL RASH Items with * indicate a potential emergency and should be followed up as soon as possible.  Feel free to call the clinic should you have any questions or concerns. The clinic phone number is [phone].  Please show the CHEMO ALERT CARD at check-in to the Emergency Department and triage nurse.   

## 2018-11-03 NOTE — Progress Notes (Signed)
[place] OFFICE PROGRESS NOTE  Patient Care Team: Lovenia Kim, MD as PCP - General [name], MD as Consulting Physician (Ophthalmology) [name], DPM as Consulting Physician (Podiatry) [name], [name], MD as Referring Physician (Optometry) [name], MD as Consulting Physician (Orthopedic Surgery) [name], RN as Oncology Nurse Navigator (Oncology)  ASSESSMENT & PLAN:  Uterine cancer [facility]) She has positive response to treatment with recent imaging study So far, she tolerated treatment very well The plan would be to complete 6 cycles of treatment before repeat imaging study. I plan to add additional marker to her previous tissue sample to see if she will qualify for Herceptin in the future  Anemia due to antineoplastic chemotherapy She is not symptomatic We will proceed with treatment We will monitor her blood count carefully.  CKD (chronic kidney disease), stage III ([place]) She has stable CKD stage III Observe closely for now Planned dose adjustment as before but I plan to keep carboplatinum dose at 420 mg  Essential hypertension Her blood pressure fluctuated up and down She is symptomatic with fluid retention She will continue diuretic therapy   No orders of the defined types were placed in this encounter.   INTERVAL HISTORY: Please see below for problem oriented charting. She returns for cycle 5 of chemotherapy Unfortunately, her son was recently diagnosed with stroke She is sad She tolerated recent chemotherapy well No recent bleeding She complained of leg swelling No peripheral neuropathy.  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 Ultrasound irregular hypoechoic mass 7 x 6 x 5 mm    01/06/2012  Initial Biopsy    Initial biopsy was benign . 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 Cervix, biopsy - HIGH GRADE CARCINOMA. - SEE COMMENT. 2. Endocervix, curettage - HIGH GRADE CARCINOMA. - SEE COMMENT. 3. Endometrium, biopsy - HIGH GRADE CARCINOMA. - SEE COMMENT. Microscopic Comment 1. - 3. The carcinoma in the three specimens is morphologically similar and has features suggestive of papillary serous carcinoma.    07/26/2018 Tumor Marker    Patient's tumor was tested for the following markers: CA-125 Results of the tumor marker test revealed 835    07/28/2018 Imaging    1. Several (at least 10) solid pulmonary nodules scattered throughout both lungs are new since 2012 chest CT, likely representing pulmonary metastases, largest 6 mm, below PET resolution. 2. Redemonstration of hypodense 2.5 cm segment  7 right liver lobe mass, indeterminate, suspicious for liver metastasis. 3. 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 4. Three-vessel coronary atherosclerosis.  Aortic Atherosclerosis (ICD10-I70.0).    08/03/2018 Cancer Staging    Staging form: Corpus Uteri - Carcinoma and Carcinosarcoma, AJCC 8th Edition - Clinical: FIGO Stage IVB (cT3b, cN2, cM1)  - Signed by [name], MD on 08/03/2018    08/05/2018 Tumor Marker    Patient's tumor was tested for the following markers: CA-125 Results of the tumor marker test revealed 901    08/06/2018 Procedure    Placement of single lumen port a cath via right internal jugular vein. The catheter tip lies at the cavo-atrial junction. A power injectable port a cath was placed and is ready for immediate use.     08/11/2018 -  Chemotherapy    The patient had carboplatin and taxol    09/01/2018 Tumor Marker    Patient's tumor was tested for the following markers: CA-125 Results of the tumor marker test revealed 406    09/09/2018 Genetic Testing    The Common Hereditary Cancers Panel offered by [facility] includes sequencing and/or deletion duplication testing of the following 48 genes: APC, ATM, AXIN2, BARD1, BMPR1A, BRCA1, BRCA2, BRIP1, CDH1, CDKN2A (p14ARF), CDKN2A (p16INK4a), CKD4, CHEK2, CTNNA1, DICER1, EPCAM (Deletion/duplication testing only), GREM1 (promoter region deletion/duplication testing only), KIT, MEN1, MLH1, MSH2, MSH3, MSH6, MUTYH, NBN, NF1, NHTL1, PALB2, PDGFRA, PMS2, POLD1, POLE, PTEN, RAD50, RAD51C, RAD51D, RNF43, SDHB, SDHC, SDHD, SMAD4, SMARCA4. STK11, TP53, TSC1, TSC2, and VHL.  The following genes were evaluated for sequence changes only: SDHA and HOXB13 c.251G>A variant only.  Results: Negative, no pathogenic variants identified. The report date is 09/09/2018.      09/22/2018 Tumor Marker    Patient's tumor was tested for the following markers: CA-125 Results of the tumor marker test revealed 110    10/12/2018 Tumor Marker    Patient's tumor was tested for the following markers: CA-125 Results of the tumor marker test revealed 55.3    10/12/2018 Imaging    1. Mild decrease in pulmonary metastases, liver metastases, and metastatic abdominal and pelvic lymphadenopathy. 2. Decreased size of complex cystic lesion in left presacral region. 3. No new or progressive metastatic disease  identified. 4. Stable small right inguinal hernia containing small portion of urinary bladder.     REVIEW OF SYSTEMS:   Constitutional: Denies fevers, chills or abnormal weight loss Eyes: Denies blurriness of vision Ears, nose, mouth, throat, and face: Denies mucositis or sore throat Respiratory: Denies cough, dyspnea or wheezes Cardiovascular: Denies palpitation, chest discomfort or lower extremity swelling Gastrointestinal:  Denies nausea, heartburn or change in bowel habits Skin: Denies abnormal skin rashes Lymphatics: Denies new lymphadenopathy or easy bruising Neurological:Denies numbness, tingling or new weaknesses Behavioral/Psych: Mood is stable, no new changes  All other systems were reviewed with the patient and are negative.  I have reviewed the past medical history, past surgical history, social history and family history with the patient and they are unchanged from previous note.  ALLERGIES:  has No Known Allergies.  MEDICATIONS:  Current Outpatient Medications  Medication Sig Dispense Refill  . acetaminophen (TYLENOL) 500 MG tablet Take 1 tablet (500 mg total) by mouth every 6 (six) hours as needed. (Patient taking differently: Take 500 mg by mouth every 6 (six) hours as needed for moderate pain. ) 30 tablet 3  . ADVAIR DISKUS 100-50 MCG/DOSE AEPB INHALE 1 PUFF INTO THE LUNGS DAILY 60 each 0  . Calcium Carbonate (CALTRATE 600  PO) Take 1 tablet by mouth 2 (two) times daily.     Marland Kitchen dexamethasone (DECADRON) 4 MG tablet Take 2 tabs the night before and 2 tabs on the morning of chemotherapy every 3 weeks with food 12 tablet 1  . enalapril (VASOTEC) 20 MG tablet Take 1 tablet (20 mg total) by mouth daily. 180 tablet 2  . furosemide (LASIX) 40 MG tablet TAKE 1 TABLET BY MOUTH DAILY AS NEEDED FOR LEG SWELLING 90 tablet 2  . hydrochlorothiazide (HYDRODIURIL) 25 MG tablet TAKE 1 TABLET(25 MG) BY MOUTH DAILY 90 tablet 1  . lidocaine-prilocaine (EMLA) cream Apply to affected area  once 30 g 3  . Multiple Vitamin (MULTIVITAMIN) tablet Take 1 tablet by mouth daily.    . Multiple Vitamins-Minerals (ICAPS AREDS 2 PO) Take 1 capsule by mouth 2 (two) times daily.    . Multiple Vitamins-Minerals (WOMENS BONE HEALTH PO) Take 1 tablet by mouth daily.    . ondansetron (ZOFRAN) 8 MG tablet Take 1 tablet (8 mg total) by mouth every 8 (eight) hours as needed. Start on the third day after chemotherapy. 30 tablet 1  . prochlorperazine (COMPAZINE) 10 MG tablet Take 1 tablet (10 mg total) by mouth every 6 (six) hours as needed (Nausea or vomiting). 30 tablet 1  . senna (SENOKOT) 8.6 MG TABS tablet Take 1 tablet (8.6 mg total) by mouth daily. 120 each 0  . Vitamin D, Cholecalciferol, 1000 units CAPS Take by mouth daily.    Alveda Reasons 20 MG TABS tablet TAKE 1 TABLET BY MOUTH DAILY 90 tablet 0   No current facility-administered medications for this visit.     PHYSICAL EXAMINATION: ECOG PERFORMANCE STATUS: 1 - Symptomatic but completely ambulatory  Vitals:   11/03/18 0948  BP: 93/74  Pulse: (!) 101  Resp: 17  Temp: 98.4 F (36.9 C)  SpO2: 100%   Filed Weights   11/03/18 0948  Weight: 266 lb (120.7 kg)    GENERAL:alert, no distress and comfortable SKIN: skin color, texture, turgor are normal, no rashes or significant lesions EYES: normal, Conjunctiva are pink and non-injected, sclera clear OROPHARYNX:no exudate, no erythema and lips, buccal mucosa, and tongue normal  NECK: supple, thyroid normal size, non-tender, without nodularity LYMPH:  no palpable lymphadenopathy in the cervical, axillary or inguinal LUNGS: clear to auscultation and percussion with normal breathing effort HEART: regular rate & rhythm and no murmurs and no lower extremity edema ABDOMEN:abdomen soft, non-tender and normal bowel sounds Musculoskeletal:no cyanosis of digits and no clubbing  NEURO: alert & oriented x 3 with fluent speech, no focal motor/sensory deficits  LABORATORY DATA:  I have reviewed  the data as listed    Component Value Date/Time   NA 138 11/03/2018 0930   NA 142 12/25/2014 1122   K 4.0 11/03/2018 0930   K 4.1 12/25/2014 1122   CL 102 11/03/2018 0930   CL 102 10/04/2012 1159   CO2 22 11/03/2018 0930   CO2 29 12/25/2014 1122   GLUCOSE 142 (H) 11/03/2018 0930   GLUCOSE 99 12/25/2014 1122   GLUCOSE 116 (H) 10/04/2012 1159   BUN 15 11/03/2018 0930   BUN 13.7 12/25/2014 1122   CREATININE 1.05 (H) 11/03/2018 0930   CREATININE 0.96 (H) 02/27/2016 1530   CREATININE 0.9 12/25/2014 1122   CALCIUM 10.0 11/03/2018 0930   CALCIUM 10.1 12/25/2014 1122   PROT 7.8 11/03/2018 0930   PROT 7.8 12/25/2014 1122   ALBUMIN 3.2 (L) 11/03/2018 0930   ALBUMIN 3.4 (L) 12/25/2014 1122  AST 19 11/03/2018 0930   AST 21 12/25/2014 1122   ALT 8 11/03/2018 0930   ALT 17 12/25/2014 1122   ALKPHOS 88 11/03/2018 0930   ALKPHOS 99 12/25/2014 1122   BILITOT 0.2 (L) 11/03/2018 0930   BILITOT 0.41 12/25/2014 1122   GFRNONAA 50 (L) 11/03/2018 0930   GFRNONAA 58 (L) 02/27/2016 1530   GFRAA 58 (L) 11/03/2018 0930   GFRAA 66 02/27/2016 1530    No results found for: SPEP, UPEP  Lab Results  Component Value Date   WBC 12.7 (H) 11/03/2018   NEUTROABS 11.5 (H) 11/03/2018   HGB 8.1 (L) 11/03/2018   HCT 24.7 (L) 11/03/2018   MCV 99.6 11/03/2018   PLT 186 11/03/2018      Chemistry      Component Value Date/Time   NA 138 11/03/2018 0930   NA 142 12/25/2014 1122   K 4.0 11/03/2018 0930   K 4.1 12/25/2014 1122   CL 102 11/03/2018 0930   CL 102 10/04/2012 1159   CO2 22 11/03/2018 0930   CO2 29 12/25/2014 1122   BUN 15 11/03/2018 0930   BUN 13.7 12/25/2014 1122   CREATININE 1.05 (H) 11/03/2018 0930   CREATININE 0.96 (H) 02/27/2016 1530   CREATININE 0.9 12/25/2014 1122      Component Value Date/Time   CALCIUM 10.0 11/03/2018 0930   CALCIUM 10.1 12/25/2014 1122   ALKPHOS 88 11/03/2018 0930   ALKPHOS 99 12/25/2014 1122   AST 19 11/03/2018 0930   AST 21 12/25/2014 1122   ALT 8  11/03/2018 0930   ALT 17 12/25/2014 1122   BILITOT 0.2 (L) 11/03/2018 0930   BILITOT 0.41 12/25/2014 1122       RADIOGRAPHIC STUDIES: I have personally reviewed the radiological images as listed and agreed with the findings in the report. Ct Chest W Contrast  Result Date: 10/12/2018 CLINICAL DATA:  Follow-up endometrial carcinoma. EXAM: CT CHEST, ABDOMEN, AND PELVIS WITH CONTRAST TECHNIQUE: Multidetector CT imaging of the chest, abdomen and pelvis was performed following the standard protocol during bolus administration of intravenous contrast. CONTRAST:  144m OMNIPAQUE IOHEXOL 300 MG/ML  SOLN COMPARISON:  Chest CT on 07/28/2018, and AP CT on 07/21/2018 FINDINGS: CT CHEST FINDINGS Cardiovascular: No acute findings. Aortic and coronary artery atherosclerosis. Mediastinum/Lymph Nodes: No masses or pathologically enlarged lymph nodes identified. Lungs/Pleura: Multiple sub-cm pulmonary nodules are again seen in both lungs. Several nodules in both upper lobes show decrease in size, with index nodule in the posterolateral right upper lobe measuring 3 mm on image 48/6, compared to 6 mm previously. Multiple other sub-cm nodules show no change in size. No new or enlarging pulmonary nodules or masses identified. No evidence of pulmonary infiltrate or pleural effusion. Musculoskeletal:  No suspicious bone lesions identified. CT ABDOMEN AND PELVIS FINDINGS Hepatobiliary: A 1.2 cm hypovascular lesion in the posterior liver dome has decreased in size from 2.0 cm on previous study. Other tiny sub-cm low-attenuation lesions remain stable, some which are too small to characterize and some representing tiny cysts. No new or enlarging liver masses identified. Gallbladder is unremarkable. No evidence of biliary ductal dilatation. Pancreas:  No mass or inflammatory changes. Spleen:  Within normal limits in size and appearance. Adrenals/Urinary tract: No masses or hydronephrosis. Small simple left renal cyst again noted.  Stomach/Bowel: No evidence of obstruction, inflammatory process, or abnormal fluid collections. Vascular/Lymphatic: Abdominal retroperitoneal lymphadenopathy in the left paraaortic region shows mild improvement, with index lymph node measuring 14 mm on image 68/2, compared to  2.2 cm previously. Mild bilateral common and external iliac lymphadenopathy has also decreased, with index lymph node in the left external iliac chain measuring 1.1 cm on image 106/2, compared to 1 point 5 cm previously. No new or increased lymphadenopathy identified. Reproductive: An ovoid cystic lesion with irregular rim thickening enhancement in the left presacral region currently measures 5.1 x 3.2 cm, compared to 8.2 x 6.7 cm previously. No new or enlarging pelvic masses identified. No evidence of inflammatory process or ascites. Other: Stable small right inguinal hernia containing a small portion of the urinary bladder. No evidence of herniated bowel loops. Musculoskeletal:  No suspicious bone lesions identified. IMPRESSION: 1. Mild decrease in pulmonary metastases, liver metastases, and metastatic abdominal and pelvic lymphadenopathy. 2. Decreased size of complex cystic lesion in left presacral region. 3. No new or progressive metastatic disease identified. 4. Stable small right inguinal hernia containing small portion of urinary bladder. Electronically Signed   By: Earle Gell M.D.   On: 10/12/2018 15:49   Ct Abdomen Pelvis W Contrast  Result Date: 10/12/2018 CLINICAL DATA:  Follow-up endometrial carcinoma. EXAM: CT CHEST, ABDOMEN, AND PELVIS WITH CONTRAST TECHNIQUE: Multidetector CT imaging of the chest, abdomen and pelvis was performed following the standard protocol during bolus administration of intravenous contrast. CONTRAST:  118m OMNIPAQUE IOHEXOL 300 MG/ML  SOLN COMPARISON:  Chest CT on 07/28/2018, and AP CT on 07/21/2018 FINDINGS: CT CHEST FINDINGS Cardiovascular: No acute findings. Aortic and coronary artery  atherosclerosis. Mediastinum/Lymph Nodes: No masses or pathologically enlarged lymph nodes identified. Lungs/Pleura: Multiple sub-cm pulmonary nodules are again seen in both lungs. Several nodules in both upper lobes show decrease in size, with index nodule in the posterolateral right upper lobe measuring 3 mm on image 48/6, compared to 6 mm previously. Multiple other sub-cm nodules show no change in size. No new or enlarging pulmonary nodules or masses identified. No evidence of pulmonary infiltrate or pleural effusion. Musculoskeletal:  No suspicious bone lesions identified. CT ABDOMEN AND PELVIS FINDINGS Hepatobiliary: A 1.2 cm hypovascular lesion in the posterior liver dome has decreased in size from 2.0 cm on previous study. Other tiny sub-cm low-attenuation lesions remain stable, some which are too small to characterize and some representing tiny cysts. No new or enlarging liver masses identified. Gallbladder is unremarkable. No evidence of biliary ductal dilatation. Pancreas:  No mass or inflammatory changes. Spleen:  Within normal limits in size and appearance. Adrenals/Urinary tract: No masses or hydronephrosis. Small simple left renal cyst again noted. Stomach/Bowel: No evidence of obstruction, inflammatory process, or abnormal fluid collections. Vascular/Lymphatic: Abdominal retroperitoneal lymphadenopathy in the left paraaortic region shows mild improvement, with index lymph node measuring 14 mm on image 68/2, compared to 2.2 cm previously. Mild bilateral common and external iliac lymphadenopathy has also decreased, with index lymph node in the left external iliac chain measuring 1.1 cm on image 106/2, compared to 1 point 5 cm previously. No new or increased lymphadenopathy identified. Reproductive: An ovoid cystic lesion with irregular rim thickening enhancement in the left presacral region currently measures 5.1 x 3.2 cm, compared to 8.2 x 6.7 cm previously. No new or enlarging pelvic masses  identified. No evidence of inflammatory process or ascites. Other: Stable small right inguinal hernia containing a small portion of the urinary bladder. No evidence of herniated bowel loops. Musculoskeletal:  No suspicious bone lesions identified. IMPRESSION: 1. Mild decrease in pulmonary metastases, liver metastases, and metastatic abdominal and pelvic lymphadenopathy. 2. Decreased size of complex cystic lesion in left presacral region. 3.  No new or progressive metastatic disease identified. 4. Stable small right inguinal hernia containing small portion of urinary bladder. Electronically Signed   By: Earle Gell M.D.   On: 10/12/2018 15:49    All questions were answered. The patient knows to call the clinic with any problems, questions or concerns. No barriers to learning was detected.  I spent 15 minutes counseling the patient face to face. The total time spent in the appointment was 20 minutes and more than 50% was on counseling and review of test results  [name], MD 11/03/2018 10:26 AM

## 2018-11-03 NOTE — Progress Notes (Signed)
[place] Spiritual Care Note  Referred by Chaplain Ree Edman, who is also clergy at Ms [employer], [place]. In addition to chemo, Ms Goldbach is dealing with the imminent death of her son, who has recently had what is anticipated to be a terminal extubation. Brought Ms Brenner a prayer shawl, bone pillow, and comfort bag as tangible signs of care and support.  Both prayer and family are important parts of her coping and meaning-making; faith has helped her get through many losses as well as her tx. Also provided my card, brochure, and [place] team/programming information for her to have on hand in case connecting becomes something she has more need and bandwidth for later on. She welcomes a follow-up call and ongoing prayer for her and her family. Plan to phone next week, at least to LVM of encouragement and care.   [place], [place], [facility] Pager [phone] Voicemail [phone]

## 2018-11-03 NOTE — Assessment & Plan Note (Signed)
She has positive response to treatment with recent imaging study So far, she tolerated treatment very well The plan would be to complete 6 cycles of treatment before repeat imaging study. I plan to add additional marker to her previous tissue sample to see if she will qualify for Herceptin in the future

## 2018-11-04 ENCOUNTER — Other Ambulatory Visit: Payer: Self-pay

## 2018-11-04 LAB — CA 125: Cancer Antigen (CA) 125: 34.3 U/mL (ref 0.0–38.1)

## 2018-11-04 MED ORDER — FUROSEMIDE 40 MG PO TABS: ORAL_TABLET | ORAL | 2 refills | Status: DC

## 2018-11-08 ENCOUNTER — Other Ambulatory Visit: Payer: Self-pay | Admitting: Family Medicine

## 2018-11-10 ENCOUNTER — Other Ambulatory Visit: Payer: Self-pay | Admitting: Family Medicine

## 2018-11-18 ENCOUNTER — Other Ambulatory Visit: Payer: Self-pay | Admitting: Family Medicine

## 2018-11-24 ENCOUNTER — Other Ambulatory Visit: Payer: Self-pay | Admitting: Hematology and Oncology

## 2018-11-24 ENCOUNTER — Encounter: Payer: Self-pay | Admitting: Hematology and Oncology

## 2018-11-24 ENCOUNTER — Inpatient Hospital Stay: Payer: Medicare Other

## 2018-11-24 ENCOUNTER — Encounter: Payer: Self-pay | Admitting: General Practice

## 2018-11-24 ENCOUNTER — Inpatient Hospital Stay (HOSPITAL_BASED_OUTPATIENT_CLINIC_OR_DEPARTMENT_OTHER): Payer: Medicare Other | Admitting: Hematology and Oncology

## 2018-11-24 ENCOUNTER — Other Ambulatory Visit: Payer: Self-pay

## 2018-11-24 VITALS — BP 128/55 | HR 60 | Temp 98.2°F | Resp 16

## 2018-11-24 DIAGNOSIS — T451X5A Adverse effect of antineoplastic and immunosuppressive drugs, initial encounter: Secondary | ICD-10-CM

## 2018-11-24 DIAGNOSIS — C55 Malignant neoplasm of uterus, part unspecified: Secondary | ICD-10-CM

## 2018-11-24 DIAGNOSIS — N183 Chronic kidney disease, stage 3 unspecified: Secondary | ICD-10-CM

## 2018-11-24 DIAGNOSIS — I129 Hypertensive chronic kidney disease with stage 1 through stage 4 chronic kidney disease, or unspecified chronic kidney disease: Secondary | ICD-10-CM | POA: Diagnosis not present

## 2018-11-24 DIAGNOSIS — C7800 Secondary malignant neoplasm of unspecified lung: Secondary | ICD-10-CM | POA: Diagnosis not present

## 2018-11-24 DIAGNOSIS — C787 Secondary malignant neoplasm of liver and intrahepatic bile duct: Secondary | ICD-10-CM | POA: Diagnosis not present

## 2018-11-24 DIAGNOSIS — D61818 Other pancytopenia: Secondary | ICD-10-CM | POA: Diagnosis not present

## 2018-11-24 DIAGNOSIS — Z5111 Encounter for antineoplastic chemotherapy: Secondary | ICD-10-CM | POA: Diagnosis not present

## 2018-11-24 DIAGNOSIS — C541 Malignant neoplasm of endometrium: Secondary | ICD-10-CM | POA: Diagnosis not present

## 2018-11-24 DIAGNOSIS — D6481 Anemia due to antineoplastic chemotherapy: Secondary | ICD-10-CM

## 2018-11-24 DIAGNOSIS — K4090 Unilateral inguinal hernia, without obstruction or gangrene, not specified as recurrent: Secondary | ICD-10-CM | POA: Diagnosis not present

## 2018-11-24 LAB — CBC WITH DIFFERENTIAL/PLATELET
Abs Immature Granulocytes: 0.09 10*3/uL — ABNORMAL HIGH (ref 0.00–0.07)
Basophils Absolute: 0 10*3/uL (ref 0.0–0.1)
Basophils Relative: 0 %
Eosinophils Absolute: 0 10*3/uL (ref 0.0–0.5)
Eosinophils Relative: 0 %
HCT: 22.5 % — ABNORMAL LOW (ref 36.0–46.0)
Hemoglobin: 7.3 g/dL — ABNORMAL LOW (ref 12.0–15.0)
Immature Granulocytes: 1 %
Lymphocytes Relative: 5 %
Lymphs Abs: 0.5 10*3/uL — ABNORMAL LOW (ref 0.7–4.0)
MCH: 33.5 pg (ref 26.0–34.0)
MCHC: 32.4 g/dL (ref 30.0–36.0)
MCV: 103.2 fL — ABNORMAL HIGH (ref 80.0–100.0)
Monocytes Absolute: 0.2 10*3/uL (ref 0.1–1.0)
Monocytes Relative: 2 %
Neutro Abs: 8.5 10*3/uL — ABNORMAL HIGH (ref 1.7–7.7)
Neutrophils Relative %: 92 %
Platelets: 98 10*3/uL — ABNORMAL LOW (ref 150–400)
RBC: 2.18 MIL/uL — ABNORMAL LOW (ref 3.87–5.11)
RDW: 16.7 % — ABNORMAL HIGH (ref 11.5–15.5)
WBC: 9.3 10*3/uL (ref 4.0–10.5)
nRBC: 0 % (ref 0.0–0.2)

## 2018-11-24 LAB — COMPREHENSIVE METABOLIC PANEL
ALT: 9 U/L (ref 0–44)
AST: 17 U/L (ref 15–41)
Albumin: 3.2 g/dL — ABNORMAL LOW (ref 3.5–5.0)
Alkaline Phosphatase: 82 U/L (ref 38–126)
Anion gap: 10 (ref 5–15)
BUN: 18 mg/dL (ref 8–23)
CO2: 26 mmol/L (ref 22–32)
Calcium: 9.3 mg/dL (ref 8.9–10.3)
Chloride: 103 mmol/L (ref 98–111)
Creatinine, Ser: 1.06 mg/dL — ABNORMAL HIGH (ref 0.44–1.00)
GFR calc Af Amer: 58 mL/min — ABNORMAL LOW (ref >60)
GFR calc non Af Amer: 50 mL/min — ABNORMAL LOW (ref >60)
Glucose, Bld: 141 mg/dL — ABNORMAL HIGH (ref 70–99)
Potassium: 3.9 mmol/L (ref 3.5–5.1)
Sodium: 139 mmol/L (ref 135–145)
Total Bilirubin: 0.2 mg/dL — ABNORMAL LOW (ref 0.3–1.2)
Total Protein: 7.6 g/dL (ref 6.5–8.1)

## 2018-11-24 LAB — ABO/RH: ABO/RH(D): O POS

## 2018-11-24 LAB — SAMPLE TO BLOOD BANK

## 2018-11-24 LAB — PREPARE RBC (CROSSMATCH)

## 2018-11-24 MED ORDER — DIPHENHYDRAMINE HCL 50 MG/ML IJ SOLN
INTRAMUSCULAR | Status: AC
  Filled 2018-11-24: qty 1

## 2018-11-24 MED ORDER — ACETAMINOPHEN 325 MG PO TABS
650.0000 mg | ORAL_TABLET | Freq: Once | ORAL | Status: AC
  Administered 2018-11-24: 650 mg via ORAL

## 2018-11-24 MED ORDER — SODIUM CHLORIDE 0.9% FLUSH
10.0000 mL | Freq: Once | INTRAVENOUS | Status: AC
  Administered 2018-11-24: 10 mL
  Filled 2018-11-24: qty 10

## 2018-11-24 MED ORDER — FAMOTIDINE IN NACL 20-0.9 MG/50ML-% IV SOLN
INTRAVENOUS | Status: AC
  Filled 2018-11-24: qty 50

## 2018-11-24 MED ORDER — ACETAMINOPHEN 325 MG PO TABS
ORAL_TABLET | ORAL | Status: AC
  Filled 2018-11-24: qty 2

## 2018-11-24 MED ORDER — DIPHENHYDRAMINE HCL 25 MG PO CAPS
ORAL_CAPSULE | ORAL | Status: AC
  Filled 2018-11-24: qty 1

## 2018-11-24 MED ORDER — SODIUM CHLORIDE 0.9 % IV SOLN
140.0000 mg/m2 | Freq: Once | INTRAVENOUS | Status: AC
  Administered 2018-11-24: 336 mg via INTRAVENOUS
  Filled 2018-11-24: qty 56

## 2018-11-24 MED ORDER — SODIUM CHLORIDE 0.9% IV SOLUTION
250.0000 mL | Freq: Once | INTRAVENOUS | Status: AC
  Administered 2018-11-24: 250 mL via INTRAVENOUS
  Filled 2018-11-24: qty 250

## 2018-11-24 MED ORDER — DIPHENHYDRAMINE HCL 50 MG/ML IJ SOLN
50.0000 mg | Freq: Once | INTRAMUSCULAR | Status: AC
  Administered 2018-11-24: 50 mg via INTRAVENOUS

## 2018-11-24 MED ORDER — DIPHENHYDRAMINE HCL 25 MG PO CAPS
25.0000 mg | ORAL_CAPSULE | Freq: Once | ORAL | Status: AC
  Administered 2018-11-24: 25 mg via ORAL

## 2018-11-24 MED ORDER — PALONOSETRON HCL INJECTION 0.25 MG/5ML
0.2500 mg | Freq: Once | INTRAVENOUS | Status: AC
  Administered 2018-11-24: 0.25 mg via INTRAVENOUS

## 2018-11-24 MED ORDER — SODIUM CHLORIDE 0.9 % IV SOLN
330.0000 mg | Freq: Once | INTRAVENOUS | Status: AC
  Administered 2018-11-24: 330 mg via INTRAVENOUS
  Filled 2018-11-24: qty 33

## 2018-11-24 MED ORDER — SODIUM CHLORIDE 0.9 % IV SOLN
Freq: Once | INTRAVENOUS | Status: AC
  Administered 2018-11-24: 12:00:00 via INTRAVENOUS
  Filled 2018-11-24: qty 5

## 2018-11-24 MED ORDER — SODIUM CHLORIDE 0.9% FLUSH
10.0000 mL | INTRAVENOUS | Status: DC | PRN
  Administered 2018-11-24: 10 mL
  Filled 2018-11-24: qty 10

## 2018-11-24 MED ORDER — SODIUM CHLORIDE 0.9 % IV SOLN
Freq: Once | INTRAVENOUS | Status: AC
  Administered 2018-11-24: 11:00:00 via INTRAVENOUS
  Filled 2018-11-24: qty 250

## 2018-11-24 MED ORDER — FAMOTIDINE IN NACL 20-0.9 MG/50ML-% IV SOLN
20.0000 mg | Freq: Once | INTRAVENOUS | Status: AC
  Administered 2018-11-24: 20 mg via INTRAVENOUS

## 2018-11-24 MED ORDER — PALONOSETRON HCL INJECTION 0.25 MG/5ML
INTRAVENOUS | Status: AC
  Filled 2018-11-24: qty 5

## 2018-11-24 MED ORDER — HEPARIN SOD (PORK) LOCK FLUSH 100 UNIT/ML IV SOLN
500.0000 [IU] | Freq: Once | INTRAVENOUS | Status: AC | PRN
  Administered 2018-11-24: 500 [IU]
  Filled 2018-11-24: qty 5

## 2018-11-24 NOTE — Progress Notes (Signed)
[place] OFFICE PROGRESS NOTE  Patient Care Team: Lovenia Kim, MD as PCP - General [name], MD as Consulting Physician (Ophthalmology) [name], DPM as Consulting Physician (Podiatry) [name], [name], MD as Referring Physician (Optometry) [name], MD as Consulting Physician (Orthopedic Surgery) [name], RN as Oncology Nurse Navigator (Oncology)  ASSESSMENT & PLAN:  Uterine cancer [facility]) She tolerated treatment poorly with worsening pancytopenia despite dose adjustment I will reduce her dose of chemotherapy further and we will proceed with treatment I plan to order CT imaging next month for further follow-up  Pancytopenia, acquired [facility]) She is mildly symptomatic I will proceed with reduced dose chemotherapy We discussed some of the risks, benefits, and alternatives of blood transfusions. The patient is symptomatic from anemia and the hemoglobin level is critically low.  Some of the side-effects to be expected including risks of transfusion reactions, chills, infection, syndrome of volume overload and risk of hospitalization from various reasons and the patient is willing to proceed and went ahead to sign consent today. She will receive a unit of blood as well I suspect she will not be ready to resume chemotherapy in 3 weeks I will adjust her appointment and delay her next dose of treatment by 1 week  CKD (chronic kidney disease), stage III ([employer]) She has stable CKD stage III Observe closely for now Planned dose adjustment  Metastasis to lung [facility]) I plan imaging study as above.   Orders Placed This Encounter  Procedures  . CT ABDOMEN PELVIS W CONTRAST    Standing Status:   Future    Standing Expiration Date:   11/24/2019    Order Specific Question:   If indicated for the ordered procedure, I authorize the administration of contrast media per Radiology protocol    Answer:   Yes    Order Specific Question:   Preferred imaging  location?    Answer:   [facility]    Order Specific Question:   Radiology Contrast Protocol - do NOT remove file path    Answer:   \\[email or web address]  . CT CHEST W CONTRAST    Standing Status:   Future    Standing Expiration Date:   11/24/2019    Order Specific Question:   If indicated for the ordered procedure, I authorize the administration of contrast media per Radiology protocol    Answer:   Yes    Order Specific Question:   Preferred imaging location?    Answer:   [facility]    Order Specific Question:   Radiology Contrast Protocol - do NOT remove file path    Answer:   \\[email or web address]  . Prepare RBC    Standing Status:   Standing    Number of Occurrences:   1    Order Specific Question:   # of Units    Answer:   1 unit    Order Specific Question:   Transfusion Indications    Answer:   Symptomatic Anemia    Order Specific Question:   Special Requirements    Answer:   Irradiated    Order Specific Question:   If emergent release call blood bank    Answer:   Not emergent release    INTERVAL HISTORY: Please see below for problem oriented charting. 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 Ultrasound irregular hypoechoic mass 7 x 6 x 5 mm   01/06/2012 Initial Biopsy   Initial biopsy was benign . 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 Cervix, biopsy - HIGH GRADE CARCINOMA. - SEE COMMENT. 2. Endocervix, curettage - HIGH GRADE CARCINOMA. - SEE COMMENT. 3. Endometrium, biopsy - HIGH GRADE CARCINOMA. - SEE COMMENT. Microscopic Comment 1. - 3. The carcinoma in the three specimens is morphologically similar and has features suggestive of papillary serous carcinoma.   07/26/2018 Tumor Marker   Patient's tumor was tested for the following markers: CA-125 Results of the tumor marker test revealed 835   07/28/2018 Imaging   1. Several (at least 10) solid pulmonary nodules scattered throughout both lungs are new since 2012 chest CT, likely representing pulmonary metastases, largest 6 mm, below PET resolution. 2. Redemonstration of hypodense 2.5 cm segment 7 right liver lobe mass, indeterminate, suspicious for liver metastasis. 3. 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 4. Three-vessel coronary atherosclerosis.  Aortic Atherosclerosis (ICD10-I70.0).   08/03/2018 Cancer Staging   Staging form: Corpus Uteri - Carcinoma and Carcinosarcoma, AJCC 8th Edition - Clinical: FIGO Stage IVB (cT3b, cN2, cM1) - Signed by [name], MD on 08/03/2018   08/05/2018 Tumor Marker   Patient's tumor was tested for the following markers: CA-125 Results of the tumor marker test revealed 901   08/06/2018 Procedure   Placement of single lumen port a cath via right internal  jugular vein. The catheter tip lies at the cavo-atrial junction. A power injectable port a cath was placed and is ready for immediate use.    08/11/2018 -  Chemotherapy   The patient had carboplatin and taxol   09/01/2018 Tumor Marker   Patient's tumor was tested for the following markers: CA-125 Results of the tumor marker test revealed 406   09/09/2018 Genetic Testing   The Common Hereditary Cancers Panel offered by [facility] includes sequencing and/or deletion duplication testing of the following 48 genes: APC, ATM, AXIN2, BARD1, BMPR1A, BRCA1, BRCA2, BRIP1, CDH1, CDKN2A (p14ARF), CDKN2A (p16INK4a), CKD4, CHEK2, CTNNA1, DICER1, EPCAM (Deletion/duplication testing only), GREM1 (promoter region deletion/duplication testing only), KIT, MEN1, MLH1, MSH2, MSH3, MSH6, MUTYH, NBN, NF1, NHTL1, PALB2, PDGFRA, PMS2, POLD1, POLE, PTEN, RAD50, RAD51C, RAD51D, RNF43, SDHB, SDHC, SDHD, SMAD4, SMARCA4. STK11, TP53, TSC1, TSC2, and VHL.  The following genes were evaluated for sequence changes only: SDHA and HOXB13 c.251G>A variant only.  Results: Negative, no pathogenic variants identified. The report date is 09/09/2018.     09/22/2018  Tumor Marker   Patient's tumor was tested for the following markers: CA-125 Results of the tumor marker test revealed 110   10/12/2018 Tumor Marker   Patient's tumor was tested for the following markers: CA-125 Results of the tumor marker test revealed 55.3   10/12/2018 Imaging   1. Mild decrease in pulmonary metastases, liver metastases, and metastatic abdominal and pelvic lymphadenopathy. 2. Decreased size of complex cystic lesion in left presacral region. 3. No new or progressive metastatic disease identified. 4. Stable small right inguinal hernia containing small portion of urinary bladder.   11/03/2018 Tumor Marker   Patient's tumor was tested for the following markers: CA-125 Results of the tumor marker test revealed 34.3    Genetic Testing   Patient has genetic testing  done for HER2. Results revealed patient has the following: HER2 - negative from pathology from 07/26/18.     REVIEW OF SYSTEMS:   Constitutional: Denies fevers, chills or abnormal weight loss Eyes: Denies blurriness of vision Ears, nose, mouth, throat, and face: Denies mucositis or sore throat Respiratory: Denies cough, dyspnea or wheezes Cardiovascular: Denies palpitation, chest discomfort or lower extremity swelling Gastrointestinal:  Denies nausea, heartburn or change in bowel habits Skin: Denies abnormal skin rashes Lymphatics: Denies new lymphadenopathy or easy bruising Neurological:Denies numbness, tingling or new weaknesses Behavioral/Psych: Mood is stable, no new changes  All other systems were reviewed with the patient and are negative.  I have reviewed the past medical history, past surgical history, social history and family history with the patient and they are unchanged from previous note.  ALLERGIES:  has No Known Allergies.  MEDICATIONS:  Current Outpatient Medications  Medication Sig Dispense Refill  . acetaminophen (TYLENOL) 500 MG tablet Take 1 tablet (500 mg total) by mouth every 6 (six) hours as needed. (Patient taking differently: Take 500 mg by mouth every 6 (six) hours as needed for moderate pain. ) 30 tablet 3  . ADVAIR DISKUS 100-50 MCG/DOSE AEPB USE 1 INHALATION BY MOUTH TWICE DAILY 60 each 0  . Calcium Carbonate (CALTRATE 600 PO) Take 1 tablet by mouth 2 (two) times daily.     Marland Kitchen dexamethasone (DECADRON) 4 MG tablet Take 2 tabs the night before and 2 tabs on the morning of chemotherapy every 3 weeks with food 12 tablet 1  . enalapril (VASOTEC) 20 MG tablet TAKE 1 TABLET(20 MG) BY MOUTH TWICE DAILY 180 tablet 2  . furosemide (LASIX) 40 MG tablet TAKE 1 TABLET BY MOUTH DAILY AS NEEDED FOR LEG SWELLING 90 tablet 2  . hydrochlorothiazide (HYDRODIURIL) 25 MG tablet TAKE 1 TABLET(25 MG) BY MOUTH DAILY 90 tablet 1  . lidocaine-prilocaine (EMLA) cream Apply to  affected area once 30 g 3  . Multiple Vitamin (MULTIVITAMIN) tablet Take 1 tablet by mouth daily.    . Multiple Vitamins-Minerals (ICAPS AREDS 2 PO) Take 1 capsule by mouth 2 (two) times daily.    . Multiple Vitamins-Minerals (WOMENS BONE HEALTH PO) Take 1 tablet by mouth daily.    . ondansetron (ZOFRAN) 8 MG tablet Take 1 tablet (8 mg total) by mouth every 8 (eight) hours as needed. Start on the third day after chemotherapy. 30 tablet 1  . prochlorperazine (COMPAZINE) 10 MG tablet Take 1 tablet (10 mg total) by mouth every 6 (six) hours as needed (Nausea or vomiting). 30 tablet 1  . senna (SENOKOT) 8.6 MG TABS tablet Take 1 tablet (8.6 mg total) by mouth daily. 120 each 0  . Vitamin D, Cholecalciferol, 1000 units CAPS Take  by mouth daily.    Alveda Reasons 20 MG TABS tablet TAKE 1 TABLET BY MOUTH DAILY 90 tablet 0   No current facility-administered medications for this visit.    Facility-Administered Medications Ordered in Other Visits  Medication Dose Route Frequency Provider Last Rate Last Dose  . CARBOplatin (PARAPLATIN) 330 mg in sodium chloride 0.9 % 250 mL chemo infusion  330 mg Intravenous Once Alvy Bimler, Ni, MD      . famotidine (PEPCID) IVPB 20 mg premix  20 mg Intravenous Once Alvy Bimler, Ni, MD      . fosaprepitant (EMEND) 150 mg, dexamethasone (DECADRON) 12 mg in sodium chloride 0.9 % 145 mL IVPB   Intravenous Once Alvy Bimler, Ni, MD      . heparin lock flush 100 unit/mL  500 Units Intracatheter Once PRN Alvy Bimler, Ni, MD      . PACLitaxel (TAXOL) 336 mg in sodium chloride 0.9 % 500 mL chemo infusion (> 79m/m2)  140 mg/m2 (Treatment Plan Recorded) Intravenous Once Gorsuch, Ni, MD      . sodium chloride flush (NS) 0.9 % injection 10 mL  10 mL Intracatheter PRN GAlvy Bimler Ni, MD        PHYSICAL EXAMINATION: ECOG PERFORMANCE STATUS: 2 - Symptomatic, <50% confined to bed  Vitals:   11/24/18 1015  BP: (!) 150/76  Pulse: (!) 104  Resp: 18  Temp: 98.2 F (36.8 C)  SpO2: 100%   Filed Weights    11/24/18 1015  Weight: 264 lb 6.4 oz (119.9 kg)    GENERAL:alert, no distress and comfortable SKIN: skin color, texture, turgor are normal, no rashes or significant lesions EYES: normal, Conjunctiva are pink and non-injected, sclera clear OROPHARYNX:no exudate, no erythema and lips, buccal mucosa, and tongue normal  NECK: supple, thyroid normal size, non-tender, without nodularity LYMPH:  no palpable lymphadenopathy in the cervical, axillary or inguinal LUNGS: clear to auscultation and percussion with normal breathing effort HEART: regular rate & rhythm and no murmurs with moderate bilateral lower extremity edema ABDOMEN:abdomen soft, non-tender and normal bowel sounds Musculoskeletal:no cyanosis of digits and no clubbing  NEURO: alert & oriented x 3 with fluent speech, no focal motor/sensory deficits  LABORATORY DATA:  I have reviewed the data as listed    Component Value Date/Time   NA 139 11/24/2018 0907   NA 142 12/25/2014 1122   K 3.9 11/24/2018 0907   K 4.1 12/25/2014 1122   CL 103 11/24/2018 0907   CL 102 10/04/2012 1159   CO2 26 11/24/2018 0907   CO2 29 12/25/2014 1122   GLUCOSE 141 (H) 11/24/2018 0907   GLUCOSE 99 12/25/2014 1122   GLUCOSE 116 (H) 10/04/2012 1159   BUN 18 11/24/2018 0907   BUN 13.7 12/25/2014 1122   CREATININE 1.06 (H) 11/24/2018 0907   CREATININE 0.96 (H) 02/27/2016 1530   CREATININE 0.9 12/25/2014 1122   CALCIUM 9.3 11/24/2018 0907   CALCIUM 10.1 12/25/2014 1122   PROT 7.6 11/24/2018 0907   PROT 7.8 12/25/2014 1122   ALBUMIN 3.2 (L) 11/24/2018 0907   ALBUMIN 3.4 (L) 12/25/2014 1122   AST 17 11/24/2018 0907   AST 21 12/25/2014 1122   ALT 9 11/24/2018 0907   ALT 17 12/25/2014 1122   ALKPHOS 82 11/24/2018 0907   ALKPHOS 99 12/25/2014 1122   BILITOT 0.2 (L) 11/24/2018 0907   BILITOT 0.41 12/25/2014 1122   GFRNONAA 50 (L) 11/24/2018 0907   GFRNONAA 58 (L) 02/27/2016 1530   GFRAA 58 (L) 11/24/2018 0907   GFRAA 66 02/27/2016  1530    No  results found for: SPEP, UPEP  Lab Results  Component Value Date   WBC 9.3 11/24/2018   NEUTROABS 8.5 (H) 11/24/2018   HGB 7.3 (L) 11/24/2018   HCT 22.5 (L) 11/24/2018   MCV 103.2 (H) 11/24/2018   PLT 98 (L) 11/24/2018      Chemistry      Component Value Date/Time   NA 139 11/24/2018 0907   NA 142 12/25/2014 1122   K 3.9 11/24/2018 0907   K 4.1 12/25/2014 1122   CL 103 11/24/2018 0907   CL 102 10/04/2012 1159   CO2 26 11/24/2018 0907   CO2 29 12/25/2014 1122   BUN 18 11/24/2018 0907   BUN 13.7 12/25/2014 1122   CREATININE 1.06 (H) 11/24/2018 0907   CREATININE 0.96 (H) 02/27/2016 1530   CREATININE 0.9 12/25/2014 1122      Component Value Date/Time   CALCIUM 9.3 11/24/2018 0907   CALCIUM 10.1 12/25/2014 1122   ALKPHOS 82 11/24/2018 0907   ALKPHOS 99 12/25/2014 1122   AST 17 11/24/2018 0907   AST 21 12/25/2014 1122   ALT 9 11/24/2018 0907   ALT 17 12/25/2014 1122   BILITOT 0.2 (L) 11/24/2018 0907   BILITOT 0.41 12/25/2014 1122      All questions were answered. The patient knows to call the clinic with any problems, questions or concerns. No barriers to learning was detected.  I spent 25 minutes counseling the patient face to face. The total time spent in the appointment was 40 minutes and more than 50% was on counseling and review of test results  [name], MD 11/24/2018 11:30 AM

## 2018-11-24 NOTE — Progress Notes (Signed)
[place] CSW Progress Notes  Brief visit w patient in infusion at request of chaplain Lorrin Jackson.  Provided support, acknowledged difficulty of current circumstances of grief (recent unexpected death of adult son) in context of pandemic where normal social connections are more difficult.  Has large family, including great grandchildren, finds connection and support from her family although again opportunities are limited by pandemic restrictions.  Awaiting next scan, may not be finished w chemotherapy yet - aware of how to contact chaplain and welcomes future visits from her.  Edwyna Shell, LCSW Clinical Social Worker Phone:  [phone]

## 2018-11-24 NOTE — Assessment & Plan Note (Signed)
She has stable CKD stage III Observe closely for now Planned dose adjustment

## 2018-11-24 NOTE — Progress Notes (Signed)
Per Dr. Alvy Bimler, [place] to treat with Hgb 7.3.  Patient needs 1 unit of blood today added on.  Gardiner Rhyme

## 2018-11-24 NOTE — Assessment & Plan Note (Signed)
I plan imaging study as above.

## 2018-11-24 NOTE — Assessment & Plan Note (Addendum)
She is mildly symptomatic I will proceed with reduced dose chemotherapy We discussed some of the risks, benefits, and alternatives of blood transfusions. The patient is symptomatic from anemia and the hemoglobin level is critically low.  Some of the side-effects to be expected including risks of transfusion reactions, chills, infection, syndrome of volume overload and risk of hospitalization from various reasons and the patient is willing to proceed and went ahead to sign consent today. She will receive a unit of blood as well I suspect she will not be ready to resume chemotherapy in 3 weeks I will adjust her appointment and delay her next dose of treatment by 1 week

## 2018-11-24 NOTE — Patient Instructions (Signed)
[facility] Discharge Instructions for Patients Receiving Chemotherapy  Today you received the following chemotherapy agents Taxol and Carboplatin.  To help prevent nausea and vomiting after your treatment, we encourage you to take your nausea medication as directed.  If you develop nausea and vomiting that is not controlled by your nausea medication, call the clinic.   BELOW ARE SYMPTOMS THAT SHOULD BE REPORTED IMMEDIATELY:  *FEVER GREATER THAN 100.5 F  *CHILLS WITH OR WITHOUT FEVER  NAUSEA AND VOMITING THAT IS NOT CONTROLLED WITH YOUR NAUSEA MEDICATION  *UNUSUAL SHORTNESS OF BREATH  *UNUSUAL BRUISING OR BLEEDING  TENDERNESS IN MOUTH AND THROAT WITH OR WITHOUT PRESENCE OF ULCERS  *URINARY PROBLEMS  *BOWEL PROBLEMS  UNUSUAL RASH Items with * indicate a potential emergency and should be followed up as soon as possible.  Feel free to call the clinic should you have any questions or concerns. The clinic phone number is [phone].  Please show the [facility] at check-in to the Emergency Department and triage nurse.   Blood Transfusion, Adult, Care After This sheet gives you information about how to care for yourself after your procedure. Your doctor may also give you more specific instructions. If you have problems or questions, contact your doctor. Follow these instructions at home:   Take over-the-counter and prescription medicines only as told by your doctor.  Go back to your normal activities as told by your doctor.  Follow instructions from your doctor about how to take care of the area where an IV tube was put into your vein (insertion site). Make sure you: ? Wash your hands with soap and water before you change your bandage (dressing). If there is no soap and water, use hand sanitizer. ? Change your bandage as told by your doctor.  Check your IV insertion site every day for signs of infection. Check for: ? More redness, swelling, or  pain. ? More fluid or blood. ? Warmth. ? Pus or a bad smell. Contact a doctor if:  You have more redness, swelling, or pain around the IV insertion site.  You have more fluid or blood coming from the IV insertion site.  Your IV insertion site feels warm to the touch.  You have pus or a bad smell coming from the IV insertion site.  Your pee (urine) turns pink, red, or brown.  You feel weak after doing your normal activities. Get help right away if:  You have signs of a serious allergic or body defense (immune) system reaction, including: ? Itchiness. ? Hives. ? Trouble breathing. ? Anxiety. ? Pain in your chest or lower back. ? Fever, flushing, and chills. ? Fast pulse. ? Rash. ? Watery poop (diarrhea). ? Throwing up (vomiting). ? Dark pee. ? Serious headache. ? Dizziness. ? Stiff neck. ? Yellow color in your face or the white parts of your eyes (jaundice). Summary  After a blood transfusion, return to your normal activities as told by your doctor.  Every day, check for signs of infection where the IV tube was put into your vein.  Some signs of infection are warm skin, more redness and pain, more fluid or blood, and pus or a bad smell where the needle went in.  Contact your doctor if you feel weak or have any unusual symptoms. This information is not intended to replace advice given to you by your health care provider. Make sure you discuss any questions you have with your health care provider. Document Released: 06/09/2014 Document Revised:  01/11/2016 Document Reviewed: 01/11/2016 Elsevier Interactive Patient Education  [employer].

## 2018-11-24 NOTE — Patient Instructions (Signed)

## 2018-11-24 NOTE — Assessment & Plan Note (Signed)
She tolerated treatment poorly with worsening pancytopenia despite dose adjustment I will reduce her dose of chemotherapy further and we will proceed with treatment I plan to order CT imaging next month for further follow-up

## 2018-11-25 ENCOUNTER — Telehealth: Payer: Self-pay | Admitting: Hematology and Oncology

## 2018-11-25 LAB — TYPE AND SCREEN
ABO/RH(D): O POS
Antibody Screen: NEGATIVE
Unit division: 0

## 2018-11-25 LAB — BPAM RBC
Blood Product Expiration Date: 202007192359
ISSUE DATE / TIME: 202006241621
Unit Type and Rh: 5100

## 2018-11-25 NOTE — Telephone Encounter (Signed)
I talk with patient regarding schedule  

## 2018-12-02 ENCOUNTER — Telehealth: Payer: Self-pay | Admitting: *Deleted

## 2018-12-02 ENCOUNTER — Inpatient Hospital Stay: Payer: Medicare Other | Attending: Gynecologic Oncology | Admitting: Medical

## 2018-12-02 ENCOUNTER — Inpatient Hospital Stay: Payer: Medicare Other

## 2018-12-02 ENCOUNTER — Other Ambulatory Visit: Payer: Self-pay | Admitting: Medical

## 2018-12-02 ENCOUNTER — Telehealth: Payer: Self-pay | Admitting: Hematology and Oncology

## 2018-12-02 ENCOUNTER — Other Ambulatory Visit: Payer: Self-pay

## 2018-12-02 VITALS — BP 147/64 | HR 89 | Temp 98.3°F | Resp 18 | Ht 66.5 in | Wt 262.9 lb

## 2018-12-02 DIAGNOSIS — N183 Chronic kidney disease, stage 3 unspecified: Secondary | ICD-10-CM

## 2018-12-02 DIAGNOSIS — C541 Malignant neoplasm of endometrium: Secondary | ICD-10-CM | POA: Insufficient documentation

## 2018-12-02 DIAGNOSIS — C55 Malignant neoplasm of uterus, part unspecified: Secondary | ICD-10-CM

## 2018-12-02 DIAGNOSIS — D6481 Anemia due to antineoplastic chemotherapy: Secondary | ICD-10-CM | POA: Diagnosis not present

## 2018-12-02 DIAGNOSIS — T451X5A Adverse effect of antineoplastic and immunosuppressive drugs, initial encounter: Secondary | ICD-10-CM

## 2018-12-02 DIAGNOSIS — C787 Secondary malignant neoplasm of liver and intrahepatic bile duct: Secondary | ICD-10-CM | POA: Insufficient documentation

## 2018-12-02 DIAGNOSIS — Z452 Encounter for adjustment and management of vascular access device: Secondary | ICD-10-CM | POA: Insufficient documentation

## 2018-12-02 DIAGNOSIS — Z5111 Encounter for antineoplastic chemotherapy: Secondary | ICD-10-CM | POA: Insufficient documentation

## 2018-12-02 DIAGNOSIS — K2900 Acute gastritis without bleeding: Secondary | ICD-10-CM | POA: Diagnosis not present

## 2018-12-02 DIAGNOSIS — D649 Anemia, unspecified: Secondary | ICD-10-CM

## 2018-12-02 DIAGNOSIS — C7800 Secondary malignant neoplasm of unspecified lung: Secondary | ICD-10-CM | POA: Diagnosis not present

## 2018-12-02 LAB — CMP (CANCER CENTER ONLY)
ALT: 13 U/L (ref 0–44)
AST: 22 U/L (ref 15–41)
Albumin: 3.4 g/dL — ABNORMAL LOW (ref 3.5–5.0)
Alkaline Phosphatase: 69 U/L (ref 38–126)
Anion gap: 10 (ref 5–15)
BUN: 16 mg/dL (ref 8–23)
CO2: 24 mmol/L (ref 22–32)
Calcium: 9.5 mg/dL (ref 8.9–10.3)
Chloride: 106 mmol/L (ref 98–111)
Creatinine: 1.08 mg/dL — ABNORMAL HIGH (ref 0.44–1.00)
GFR, Est AFR Am: 57 mL/min — ABNORMAL LOW (ref >60)
GFR, Estimated: 49 mL/min — ABNORMAL LOW (ref >60)
Glucose, Bld: 110 mg/dL — ABNORMAL HIGH (ref 70–99)
Potassium: 3.9 mmol/L (ref 3.5–5.1)
Sodium: 140 mmol/L (ref 135–145)
Total Bilirubin: <0.3 mg/dL — ABNORMAL LOW (ref 0.3–1.2)
Total Protein: 7 g/dL (ref 6.5–8.1)

## 2018-12-02 LAB — CBC WITH DIFFERENTIAL (CANCER CENTER ONLY)
Abs Immature Granulocytes: 0.02 10*3/uL (ref 0.00–0.07)
Basophils Absolute: 0 10*3/uL (ref 0.0–0.1)
Basophils Relative: 1 %
Eosinophils Absolute: 0 10*3/uL (ref 0.0–0.5)
Eosinophils Relative: 0 %
HCT: 23.5 % — ABNORMAL LOW (ref 36.0–46.0)
Hemoglobin: 7.7 g/dL — ABNORMAL LOW (ref 12.0–15.0)
Immature Granulocytes: 1 %
Lymphocytes Relative: 19 %
Lymphs Abs: 0.4 10*3/uL — ABNORMAL LOW (ref 0.7–4.0)
MCH: 33.9 pg (ref 26.0–34.0)
MCHC: 32.8 g/dL (ref 30.0–36.0)
MCV: 103.5 fL — ABNORMAL HIGH (ref 80.0–100.0)
Monocytes Absolute: 0.1 10*3/uL (ref 0.1–1.0)
Monocytes Relative: 4 %
Neutro Abs: 1.7 10*3/uL (ref 1.7–7.7)
Neutrophils Relative %: 75 %
Platelet Count: 69 10*3/uL — ABNORMAL LOW (ref 150–400)
RBC: 2.27 MIL/uL — ABNORMAL LOW (ref 3.87–5.11)
RDW: 15.7 % — ABNORMAL HIGH (ref 11.5–15.5)
WBC Count: 2.2 10*3/uL — ABNORMAL LOW (ref 4.0–10.5)
nRBC: 0 % (ref 0.0–0.2)

## 2018-12-02 LAB — SAMPLE TO BLOOD BANK

## 2018-12-02 MED ORDER — SODIUM CHLORIDE 0.9% FLUSH
10.0000 mL | Freq: Once | INTRAVENOUS | Status: AC
  Administered 2018-12-02: 10 mL
  Filled 2018-12-02: qty 10

## 2018-12-02 MED ORDER — LIDOCAINE VISCOUS HCL 2 % MT SOLN
15.0000 mL | Freq: Once | OROMUCOSAL | Status: AC
  Administered 2018-12-02: 15 mL via ORAL
  Filled 2018-12-02: qty 15

## 2018-12-02 MED ORDER — ALUM & MAG HYDROXIDE-SIMETH 200-200-20 MG/5ML PO SUSP
30.0000 mL | Freq: Once | ORAL | Status: AC
  Administered 2018-12-02: 30 mL via ORAL

## 2018-12-02 MED ORDER — ALUM & MAG HYDROXIDE-SIMETH 200-200-20 MG/5ML PO SUSP
ORAL | Status: AC
  Filled 2018-12-02: qty 30

## 2018-12-02 MED ORDER — SIMETHICONE 80 MG PO TABS: 80.0000 mg | ORAL_TABLET | Freq: Four times a day (QID) | ORAL | 2 refills | Status: DC

## 2018-12-02 MED ORDER — HEPARIN SOD (PORK) LOCK FLUSH 100 UNIT/ML IV SOLN
500.0000 [IU] | Freq: Once | INTRAVENOUS | Status: AC
  Administered 2018-12-02: 500 [IU]
  Filled 2018-12-02: qty 5

## 2018-12-02 MED ORDER — OMEPRAZOLE 40 MG PO CPDR: 40.0000 mg | DELAYED_RELEASE_CAPSULE | Freq: Every day | ORAL | 5 refills | Status: DC

## 2018-12-02 NOTE — Telephone Encounter (Signed)
"  [name] daughter Efraim Kaufmann [phone]).  Completed sixth treatment (Taxol,Carboplatin) 11-24-2018.  Feeling bad today for the first time ever.    Feeling really tired, weak and drained.  Hard to move her body all over.  Walking to bathroom with no numbness or tingling.     Good appetite but needs more water.    Ate sauerkraut earlier now stomach upset.  No nausea or vomiting but says her entire abdomen feels weak.    Throbbing abdominal pain rates as greater than ten now (12:32 pm) that comes and goes to entire abdomen after two Tylenol at 11:00 am.  Says stomach much better now, was worse when she took Tylenol.    Soft BM this morning.   No bleeding or changes to uterus.  Breathing fine without shortness of breath.  No increased heart rate.   Not sure if this is a side effect or if she need to be seen.  Not bad enough to go to the ED.  Call me and I can get her there immediately."

## 2018-12-02 NOTE — Telephone Encounter (Signed)
Called Sandi Mealy, PA and he is okay with adding to his schedule for today. Called daughter and given appt for 2:30 pm today. It will take her 30 minutes to arrive at [facility]

## 2018-12-02 NOTE — Telephone Encounter (Signed)
Left message on cell re 7/4 appointment. Not able to reach patient or leave message at primary number - voice mail full.

## 2018-12-02 NOTE — Progress Notes (Signed)
[facility] Progress Note   Erin Esparza [id number] [birth year] 78 y.o.  [name] is managed by Dr. [name]  Actively treated with chemotherapy/immunotherapy/hormonal therapy: yes  Current therapy: Carboplatin and paclitaxel  Last treated: 11/24/2018 (cycle 6, day 1)  Next scheduled appointment with provider: 12/21/2018  Assessment: Plan:    Malignant neoplasm of uterus, unspecified site ([place]) - Plan: heparin lock flush 100 unit/mL, sodium chloride flush (NS) 0.9 % injection 10 mL  Acute gastritis without hemorrhage, unspecified gastritis type - Plan: alum & mag hydroxide-simeth (MAALOX/MYLANTA) 200-200-20 MG/5ML suspension 30 mL, lidocaine (XYLOCAINE) 2 % viscous mouth solution 15 mL, omeprazole (PRILOSEC) 40 MG capsule, Simethicone 80 MG TABS  Symptomatic anemia - Plan: Practitioner attestation of consent, Complete patient signature process for consent form, Type and screen, Care order/instruction, Prepare RBC, Practitioner attestation of consent, Complete patient signature process for consent form, Care order/instruction, heparin lock flush 100 unit/mL, sodium chloride flush (NS) 0.9 % injection 3 mL, Prepare RBC, Transfuse RBC, acetaminophen (TYLENOL) tablet 650 mg, diphenhydrAMINE (BENADRYL) capsule 25 mg, Type and screen, CANCELED: Transfuse RBC   Metastatic malignant neoplasm of the uterus: The patient is status post cycle 6, day 1 of carboplatin and paclitaxel which was dosed on 11/24/2018.  She is scheduled to see Dr. Alvy Bimler in follow-up on 12/21/2018.  Acute gastritis: Patient was given a GI cocktail: She was also given a prescription for Prilosec 40 mg once daily and simethicone 80 mg p.o. 4 times daily as needed.  She was told that her gastritis today was likely secondary to the sauerkraut that she had eaten earlier.  Symptomatic anemia: A CBC returned today showing a hemoglobin of 7.7 and a hematocrit of 23.5.  Based on the patient's report of  weakness she will return on 12/04/2018 for transfusion of 1 unit of packed red blood cells.  Please see After Visit Summary for patient specific instructions.  Future Appointments  Date Time Provider [place]  12/04/2018 10:00 AM [facility] INFUSION [facility] None  12/20/2018 12:30 PM [facility] 2 [facility] [place]  12/21/2018  9:00 AM [facility] ONLY [facility] None  12/21/2018  9:15 AM [facility] [place] FLUSH [facility] None  12/22/2018  9:45 AM [name], MD [facility] None  12/22/2018 10:30 AM [facility] INFUSION [facility] None    Orders Placed This Encounter  Procedures  . Practitioner attestation of consent  . Complete patient signature process for consent form  . Care order/instruction  . Practitioner attestation of consent  . Complete patient signature process for consent form  . Care order/instruction  . Type and screen  . Prepare RBC  . Prepare RBC       Subjective:   Patient ID:  Erin Esparza is a 78 y.o. (DOB 03-01-1941) female.  Chief Complaint:  Chief Complaint  Patient presents with  . Abdominal Pain    HPI Erin Esparza is a 78 year old female with a history of a metastatic uterine cancer who is managed by Dr. [name] and who is status post cycle 6, day 1 of carboplatin and paclitaxel which was dosed on 11/24/2018.  She presents to the office today with a report that she has had ongoing fatigue and low energy.  She was transfused with 1 unit of packed red blood cells last week after her hemoglobin was found to be 7.3.  She is also being seen today for acute abdominal pain after eating sauerkraut this morning.  She has not taken any medications other than 2  Tylenol at 11:00.  She has not taken any antacids.  She denies nausea vomiting, constipation, or diarrhea.  Medications: I have reviewed the patient's current medications.  Allergies: No Known Allergies  Past Medical History:  Diagnosis Date  . Abdominal pain 10/17/2016  . Arthritis    "both  feet" (10/17/2016)  . Asthma   . Breast cancer ([place]) 01/06/12   left breast invasive ductal ca,dcis,ER/PR=+,  . Breast mass in female   . Cataract   . Clotting disorder ([place])   . Diastolic congestive heart failure ([place])   . DVT (deep venous thrombosis) ([place]) 1990's?   LLE  . Family history of breast cancer   . Family history of prostate cancer   . Hypertension   . Insulin resistance   . Pulmonary embolism ([place]) ~ 2012  . Radiation 02/12/12 -03/12/12   left breast, total 50gy  . Right ankle pain   . Shortness of breath   . Venous stasis of lower extremity   . Wears dentures    upper  . Wears glasses   . Wears partial dentures    lower    Past Surgical History:  Procedure Laterality Date  . BREAST BIOPSY Left 11/03/11   10 o'clock=benign breast parenchyma  . BREAST LUMPECTOMY Left 01/06/12    Dr. Erroll Luna  . IR IMAGING GUIDED PORT INSERTION  08/06/2018    Family History  Problem Relation Age of Onset  . Heart disease Mother   . Heart attack Mother   . Breast cancer Sister 92  . Parkinson's disease Brother   . Prostate cancer Brother   . Pulmonary embolism Daughter   . Diabetes Daughter   . Pulmonary embolism Daughter   . Breast cancer Sister   . Other Brother        MVA  . Other Brother        MVA  . COPD Brother   . Breast cancer Niece        dx 28s    Social History   Socioeconomic History  . Marital status: Widowed    Spouse name: Not on file  . Number of children: 5  . Years of education: 56  . Highest education level: Associate degree: occupational, Hotel manager, or vocational program  Occupational History  . Occupation: Retired    Fish farm manager: RETIRED  Social Needs  . Financial resource strain: Not hard at all  . Food insecurity    Worry: Never true    Inability: Never true  . Transportation needs    Medical: No    Non-medical: No  Tobacco Use  . Smoking status: Former Smoker    Packs/day: 0.30    Years: 15.00    Pack years: 4.50    Types:  Cigarettes  . Smokeless tobacco: Never Used  . Tobacco comment: "stopped in the 1990s"  Substance and Sexual Activity  . Alcohol use: No  . Drug use: No  . Sexual activity: Not Currently  Lifestyle  . Physical activity    Days per week: 2 days    Minutes per session: 20 min  . Stress: Not at all  Relationships  . Social connections    Talks on phone: More than three times a week    Gets together: More than three times a week    Attends religious service: More than 4 times per year    Active member of club or organization: No    Attends meetings of clubs or organizations: Never  Relationship status: Widowed  . Intimate partner violence    Fear of current or ex partner: No    Emotionally abused: No    Physically abused: No    Forced sexual activity: No  Other Topics Concern  . Not on file  Social History Narrative      Emergency Contact: daughter, Freddie Breech (c) [phone] or Arnette Schaumann (Loletha Grayer) [phone]   Who lives with you: daughter - Arnette Schaumann and granddaughter   Lives in one level house. Has grab bars in bathroom at tub. No throw rugs. Smoke alarms   Any pets: 2 dogs, pit bull and fox terrier   Diet: Pt has a varied diet. Does not eat cheese, eats meat, fruit, and vegetables. Drinks tea, water, occasional soda.   Exercise: Pt regularly does low impact aerobic exercises with TV program, walks, stretches, and occasional water aerobics.   Seatbelts: Pt reports wearing seatbelt when in vehicles.    Hobbies: Bingo       Past Medical History, Surgical history, Social history, and Family history were reviewed and updated as appropriate.   Please see review of systems for further details on the patient's review from today.   Review of Systems:  Review of Systems  Constitutional: Positive for fatigue. Negative for activity change, appetite change, chills, diaphoresis and fever.  HENT: Negative for trouble swallowing.   Respiratory: Negative for cough, chest tightness and shortness  of breath.   Cardiovascular: Negative for chest pain, palpitations and leg swelling.  Gastrointestinal: Positive for abdominal pain. Negative for abdominal distention, constipation, diarrhea, nausea and vomiting.    Objective:   Physical Exam:  BP (!) 147/64 (BP Location: Right Arm, Patient Position: Sitting) Comment: nurse aware of bp  Pulse 89   Temp 98.3 F (36.8 C) (Oral)   Resp 18   Ht 5' 6.5" (1.689 m)   Wt 262 lb 14.4 oz (119.3 kg)   SpO2 100%   BMI 41.80 kg/m  ECOG: 1  Physical Exam Constitutional:      General: She is not in acute distress.    Appearance: She is not diaphoretic.  HENT:     Head: Normocephalic and atraumatic.     Mouth/Throat:     Pharynx: No oropharyngeal exudate.  Neck:     Musculoskeletal: Normal range of motion and neck supple.  Cardiovascular:     Rate and Rhythm: Normal rate and regular rhythm.     Heart sounds: Normal heart sounds. No murmur. No friction rub. No gallop.   Pulmonary:     Effort: Pulmonary effort is normal. No respiratory distress.     Breath sounds: Normal breath sounds. No wheezing or rales.  Abdominal:     General: Bowel sounds are normal. There is no distension.     Palpations: Abdomen is soft. There is no mass.     Tenderness: There is generalized abdominal tenderness. There is no guarding or rebound.  Lymphadenopathy:     Cervical: No cervical adenopathy.  Skin:    General: Skin is warm and dry.     Findings: No erythema or rash.  Neurological:     Mental Status: She is alert.     Coordination: Coordination normal.  Psychiatric:        Behavior: Behavior normal.        Thought Content: Thought content normal.        Judgment: Judgment normal.     Lab Review:     Component Value Date/Time   NA 139 11/24/2018 0907  NA 142 12/25/2014 1122   K 3.9 11/24/2018 0907   K 4.1 12/25/2014 1122   CL 103 11/24/2018 0907   CL 102 10/04/2012 1159   CO2 26 11/24/2018 0907   CO2 29 12/25/2014 1122   GLUCOSE 141 (H)  11/24/2018 0907   GLUCOSE 99 12/25/2014 1122   GLUCOSE 116 (H) 10/04/2012 1159   BUN 18 11/24/2018 0907   BUN 13.7 12/25/2014 1122   CREATININE 1.06 (H) 11/24/2018 0907   CREATININE 0.96 (H) 02/27/2016 1530   CREATININE 0.9 12/25/2014 1122   CALCIUM 9.3 11/24/2018 0907   CALCIUM 10.1 12/25/2014 1122   PROT 7.6 11/24/2018 0907   PROT 7.8 12/25/2014 1122   ALBUMIN 3.2 (L) 11/24/2018 0907   ALBUMIN 3.4 (L) 12/25/2014 1122   AST 17 11/24/2018 0907   AST 21 12/25/2014 1122   ALT 9 11/24/2018 0907   ALT 17 12/25/2014 1122   ALKPHOS 82 11/24/2018 0907   ALKPHOS 99 12/25/2014 1122   BILITOT 0.2 (L) 11/24/2018 0907   BILITOT 0.41 12/25/2014 1122   GFRNONAA 50 (L) 11/24/2018 0907   GFRNONAA 58 (L) 02/27/2016 1530   GFRAA 58 (L) 11/24/2018 0907   GFRAA 66 02/27/2016 1530       Component Value Date/Time   WBC 2.2 (L) 12/02/2018 1432   WBC 9.3 11/24/2018 0907   RBC 2.27 (L) 12/02/2018 1432   HGB 7.7 (L) 12/02/2018 1432   HGB 11.4 (L) 12/25/2014 1122   HCT 23.5 (L) 12/02/2018 1432   HCT 35.0 12/25/2014 1122   PLT 69 (L) 12/02/2018 1432   PLT 246 12/25/2014 1122   MCV 103.5 (H) 12/02/2018 1432   MCV 91.9 12/25/2014 1122   MCH 33.9 12/02/2018 1432   MCHC 32.8 12/02/2018 1432   RDW 15.7 (H) 12/02/2018 1432   RDW 13.5 12/25/2014 1122   LYMPHSABS 0.4 (L) 12/02/2018 1432   LYMPHSABS 1.5 12/25/2014 1122   MONOABS 0.1 12/02/2018 1432   MONOABS 0.6 12/25/2014 1122   EOSABS 0.0 12/02/2018 1432   EOSABS 0.2 12/25/2014 1122   BASOSABS 0.0 12/02/2018 1432   BASOSABS 0.0 12/25/2014 1122   -------------------------------  Imaging from last 24 hours (if applicable):  Radiology interpretation: No results found.

## 2018-12-02 NOTE — Telephone Encounter (Signed)
pls call if she can be evaluated at symptom management

## 2018-12-02 NOTE — Progress Notes (Signed)
Pt given GI cocktail for mid abd pain/cramping.  Tolerated well.  Reports feeling better with dec in pain afterwards.  Pt states that she ate sauerkraut this morning and afterwards started having stomach pain without N/V/D or any other symptoms.  PA Lucianne Lei spoke with pt and her daughter who was telephoned in.  Pt aware of date/time of blood transfusion appt on 12/04/2018 and to keep blue blood bracelet on for that infusion.

## 2018-12-02 NOTE — Patient Instructions (Signed)

## 2018-12-04 ENCOUNTER — Inpatient Hospital Stay: Payer: Medicare Other

## 2018-12-04 ENCOUNTER — Other Ambulatory Visit: Payer: Self-pay

## 2018-12-04 DIAGNOSIS — C787 Secondary malignant neoplasm of liver and intrahepatic bile duct: Secondary | ICD-10-CM | POA: Diagnosis not present

## 2018-12-04 DIAGNOSIS — K2900 Acute gastritis without bleeding: Secondary | ICD-10-CM | POA: Diagnosis not present

## 2018-12-04 DIAGNOSIS — N183 Chronic kidney disease, stage 3 (moderate): Secondary | ICD-10-CM | POA: Diagnosis not present

## 2018-12-04 DIAGNOSIS — C7800 Secondary malignant neoplasm of unspecified lung: Secondary | ICD-10-CM | POA: Diagnosis not present

## 2018-12-04 DIAGNOSIS — D6481 Anemia due to antineoplastic chemotherapy: Secondary | ICD-10-CM | POA: Diagnosis not present

## 2018-12-04 DIAGNOSIS — Z452 Encounter for adjustment and management of vascular access device: Secondary | ICD-10-CM | POA: Diagnosis not present

## 2018-12-04 DIAGNOSIS — Z5111 Encounter for antineoplastic chemotherapy: Secondary | ICD-10-CM | POA: Diagnosis not present

## 2018-12-04 DIAGNOSIS — D649 Anemia, unspecified: Secondary | ICD-10-CM

## 2018-12-04 LAB — PREPARE RBC (CROSSMATCH)

## 2018-12-04 MED ORDER — DIPHENHYDRAMINE HCL 25 MG PO CAPS
ORAL_CAPSULE | ORAL | Status: AC
  Filled 2018-12-04: qty 1

## 2018-12-04 MED ORDER — DIPHENHYDRAMINE HCL 25 MG PO CAPS
25.0000 mg | ORAL_CAPSULE | Freq: Once | ORAL | Status: AC
  Administered 2018-12-04: 25 mg via ORAL

## 2018-12-04 MED ORDER — HEPARIN SOD (PORK) LOCK FLUSH 100 UNIT/ML IV SOLN
500.0000 [IU] | Freq: Every day | INTRAVENOUS | Status: AC | PRN
  Administered 2018-12-04: 500 [IU]
  Filled 2018-12-04: qty 5

## 2018-12-04 MED ORDER — SODIUM CHLORIDE 0.9% FLUSH
3.0000 mL | INTRAVENOUS | Status: AC | PRN
  Administered 2018-12-04: 3 mL
  Filled 2018-12-04: qty 10

## 2018-12-04 MED ORDER — ACETAMINOPHEN 325 MG PO TABS
ORAL_TABLET | ORAL | Status: AC
  Filled 2018-12-04: qty 2

## 2018-12-04 MED ORDER — ACETAMINOPHEN 325 MG PO TABS
650.0000 mg | ORAL_TABLET | Freq: Once | ORAL | Status: AC
  Administered 2018-12-04: 650 mg via ORAL

## 2018-12-04 NOTE — Patient Instructions (Addendum)
Blood Transfusion, Adult, Care After This sheet gives you information about how to care for yourself after your procedure. Your doctor may also give you more specific instructions. If you have problems or questions, contact your doctor. Follow these instructions at home:   Take over-the-counter and prescription medicines only as told by your doctor.  Go back to your normal activities as told by your doctor.  Follow instructions from your doctor about how to take care of the area where an IV tube was put into your vein (insertion site). Make sure you: ? Wash your hands with soap and water before you change your bandage (dressing). If there is no soap and water, use hand sanitizer. ? Change your bandage as told by your doctor.  Check your IV insertion site every day for signs of infection. Check for: ? More redness, swelling, or pain. ? More fluid or blood. ? Warmth. ? Pus or a bad smell. Contact a doctor if:  You have more redness, swelling, or pain around the IV insertion site.  You have more fluid or blood coming from the IV insertion site.  Your IV insertion site feels warm to the touch.  You have pus or a bad smell coming from the IV insertion site.  Your pee (urine) turns pink, red, or brown.  You feel weak after doing your normal activities. Get help right away if:  You have signs of a serious allergic or body defense (immune) system reaction, including: ? Itchiness. ? Hives. ? Trouble breathing. ? Anxiety. ? Pain in your chest or lower back. ? Fever, flushing, and chills. ? Fast pulse. ? Rash. ? Watery poop (diarrhea). ? Throwing up (vomiting). ? Dark pee. ? Serious headache. ? Dizziness. ? Stiff neck. ? Yellow color in your face or the white parts of your eyes (jaundice). Summary  After a blood transfusion, return to your normal activities as told by your doctor.  Every day, check for signs of infection where the IV tube was put into your vein.  Some  signs of infection are warm skin, more redness and pain, more fluid or blood, and pus or a bad smell where the needle went in.  Contact your doctor if you feel weak or have any unusual symptoms. This information is not intended to replace advice given to you by your health care provider. Make sure you discuss any questions you have with your health care provider. Document Released: 06/09/2014 Document Revised: 09/23/2017 Document Reviewed: 01/11/2016 Elsevier Patient Education  2020 Elsevier Inc.  Coronavirus (COVID-19) Are you at risk?  Are you at risk for the Coronavirus (COVID-19)?  To be considered HIGH RISK for Coronavirus (COVID-19), you have to meet the following criteria:  . Traveled to [place], [place], [place], [place] or [place]; or in the [place] to [place], [place], [place], or [place]; and have fever, cough, and shortness of breath within the last 2 weeks of travel OR . Been in close contact with a person diagnosed with COVID-19 within the last 2 weeks and have fever, cough, and shortness of breath . IF YOU DO NOT MEET THESE CRITERIA, YOU ARE CONSIDERED LOW RISK FOR COVID-19.  What to do if you are HIGH RISK for COVID-19?  . If you are having a medical emergency, call 911. . Seek medical care right away. Before you go to a doctor's office, urgent care or emergency department, call ahead and tell them about your recent travel, contact with someone diagnosed with COVID-19, and   your symptoms. You should receive instructions from your physician's office regarding next steps of care.  . When you arrive at healthcare provider, tell the healthcare staff immediately you have returned from visiting [place], [place], [place], [place] or [place]; or traveled in the [place] to [place], [place], [place], or [place]; in the last two weeks or you have been in close contact with a person diagnosed with COVID-19 in the last 2 weeks.   . Tell the health care staff about  your symptoms: fever, cough and shortness of breath. . After you have been seen by a medical provider, you will be either: o Tested for (COVID-19) and discharged home on quarantine except to seek medical care if symptoms worsen, and asked to  - Stay home and avoid contact with others until you get your results (4-5 days)  - Avoid travel on public transportation if possible (such as bus, train, or airplane) or o Sent to the Emergency Department by EMS for evaluation, COVID-19 testing, and possible admission depending on your condition and test results.  What to do if you are LOW RISK for COVID-19?  Reduce your risk of any infection by using the same precautions used for avoiding the common cold or flu:  . Wash your hands often with soap and warm water for at least 20 seconds.  If soap and water are not readily available, use an alcohol-based hand sanitizer with at least 60% alcohol.  . If coughing or sneezing, cover your mouth and nose by coughing or sneezing into the elbow areas of your shirt or coat, into a tissue or into your sleeve (not your hands). . Avoid shaking hands with others and consider head nods or verbal greetings only. . Avoid touching your eyes, nose, or mouth with unwashed hands.  . Avoid close contact with people who are sick. . Avoid places or events with large numbers of people in one location, like concerts or sporting events. . Carefully consider travel plans you have or are making. . If you are planning any travel outside or inside the [place], visit the CDC's Travelers' Health webpage for the latest health notices. . If you have some symptoms but not all symptoms, continue to monitor at home and seek medical attention if your symptoms worsen. . If you are having a medical emergency, call 911.   ADDITIONAL HEALTHCARE OPTIONS FOR PATIENTS  [facility] / e-Visit: [email or web address]         [facility]:  [phone]  [facility]: [phone]                   [facility]: [phone]   

## 2018-12-05 LAB — TYPE AND SCREEN
ABO/RH(D): O POS
Antibody Screen: NEGATIVE
Unit division: 0

## 2018-12-05 LAB — BPAM RBC
Blood Product Expiration Date: 202007252359
ISSUE DATE / TIME: 202007041039
Unit Type and Rh: 5100

## 2018-12-06 NOTE — Progress Notes (Signed)
These preliminary result these preliminary results were noted.  Awaiting final report.

## 2018-12-20 ENCOUNTER — Other Ambulatory Visit: Payer: Self-pay

## 2018-12-20 ENCOUNTER — Encounter (HOSPITAL_COMMUNITY): Payer: Self-pay

## 2018-12-20 ENCOUNTER — Ambulatory Visit (HOSPITAL_COMMUNITY)
Admission: RE | Admit: 2018-12-20 | Discharge: 2018-12-20 | Disposition: A | Discharge status: Home / Self Care | Payer: Medicare Other | Source: Ambulatory Visit | Attending: Hematology and Oncology | Admitting: Hematology and Oncology

## 2018-12-20 DIAGNOSIS — C55 Malignant neoplasm of uterus, part unspecified: Secondary | ICD-10-CM | POA: Insufficient documentation

## 2018-12-20 DIAGNOSIS — C7800 Secondary malignant neoplasm of unspecified lung: Secondary | ICD-10-CM | POA: Diagnosis not present

## 2018-12-20 DIAGNOSIS — T451X5A Adverse effect of antineoplastic and immunosuppressive drugs, initial encounter: Secondary | ICD-10-CM | POA: Diagnosis not present

## 2018-12-20 DIAGNOSIS — D6481 Anemia due to antineoplastic chemotherapy: Secondary | ICD-10-CM | POA: Diagnosis not present

## 2018-12-20 DIAGNOSIS — C787 Secondary malignant neoplasm of liver and intrahepatic bile duct: Secondary | ICD-10-CM | POA: Diagnosis not present

## 2018-12-20 HISTORY — DX: Malignant neoplasm of endometrium: C54.1

## 2018-12-20 MED ORDER — IOHEXOL 300 MG/ML  SOLN
100.0000 mL | Freq: Once | INTRAMUSCULAR | Status: AC | PRN
  Administered 2018-12-20: 100 mL via INTRAVENOUS

## 2018-12-20 MED ORDER — SODIUM CHLORIDE (PF) 0.9 % IJ SOLN
INTRAMUSCULAR | Status: AC
  Filled 2018-12-20: qty 50

## 2018-12-20 MED ORDER — HEPARIN SOD (PORK) LOCK FLUSH 100 UNIT/ML IV SOLN
500.0000 [IU] | Freq: Once | INTRAVENOUS | Status: AC
  Administered 2018-12-20: 13:00:00 500 [IU] via INTRAVENOUS

## 2018-12-20 MED ORDER — HEPARIN SOD (PORK) LOCK FLUSH 100 UNIT/ML IV SOLN
INTRAVENOUS | Status: AC
  Administered 2018-12-20: 500 [IU] via INTRAVENOUS
  Filled 2018-12-20: qty 5

## 2018-12-21 ENCOUNTER — Inpatient Hospital Stay: Payer: Medicare Other

## 2018-12-21 ENCOUNTER — Other Ambulatory Visit: Payer: Self-pay

## 2018-12-21 DIAGNOSIS — N183 Chronic kidney disease, stage 3 (moderate): Secondary | ICD-10-CM | POA: Diagnosis not present

## 2018-12-21 DIAGNOSIS — D6481 Anemia due to antineoplastic chemotherapy: Secondary | ICD-10-CM | POA: Diagnosis not present

## 2018-12-21 DIAGNOSIS — C55 Malignant neoplasm of uterus, part unspecified: Secondary | ICD-10-CM

## 2018-12-21 DIAGNOSIS — K2900 Acute gastritis without bleeding: Secondary | ICD-10-CM | POA: Diagnosis not present

## 2018-12-21 DIAGNOSIS — C787 Secondary malignant neoplasm of liver and intrahepatic bile duct: Secondary | ICD-10-CM | POA: Diagnosis not present

## 2018-12-21 DIAGNOSIS — C7800 Secondary malignant neoplasm of unspecified lung: Secondary | ICD-10-CM | POA: Diagnosis not present

## 2018-12-21 DIAGNOSIS — T451X5A Adverse effect of antineoplastic and immunosuppressive drugs, initial encounter: Secondary | ICD-10-CM

## 2018-12-21 DIAGNOSIS — Z452 Encounter for adjustment and management of vascular access device: Secondary | ICD-10-CM | POA: Diagnosis not present

## 2018-12-21 DIAGNOSIS — Z5111 Encounter for antineoplastic chemotherapy: Secondary | ICD-10-CM | POA: Diagnosis not present

## 2018-12-21 LAB — COMPREHENSIVE METABOLIC PANEL
ALT: 7 U/L (ref 0–44)
AST: 16 U/L (ref 15–41)
Albumin: 3.1 g/dL — ABNORMAL LOW (ref 3.5–5.0)
Alkaline Phosphatase: 68 U/L (ref 38–126)
Anion gap: 11 (ref 5–15)
BUN: 13 mg/dL (ref 8–23)
CO2: 27 mmol/L (ref 22–32)
Calcium: 9.6 mg/dL (ref 8.9–10.3)
Chloride: 102 mmol/L (ref 98–111)
Creatinine, Ser: 1.05 mg/dL — ABNORMAL HIGH (ref 0.44–1.00)
GFR calc Af Amer: 58 mL/min — ABNORMAL LOW (ref >60)
GFR calc non Af Amer: 50 mL/min — ABNORMAL LOW (ref >60)
Glucose, Bld: 97 mg/dL (ref 70–99)
Potassium: 3.4 mmol/L — ABNORMAL LOW (ref 3.5–5.1)
Sodium: 140 mmol/L (ref 135–145)
Total Bilirubin: 0.3 mg/dL (ref 0.3–1.2)
Total Protein: 7.4 g/dL (ref 6.5–8.1)

## 2018-12-21 LAB — SAMPLE TO BLOOD BANK

## 2018-12-21 LAB — CBC WITH DIFFERENTIAL/PLATELET
Abs Immature Granulocytes: 0.02 10*3/uL (ref 0.00–0.07)
Basophils Absolute: 0.1 10*3/uL (ref 0.0–0.1)
Basophils Relative: 1 %
Eosinophils Absolute: 0.1 10*3/uL (ref 0.0–0.5)
Eosinophils Relative: 1 %
HCT: 24.5 % — ABNORMAL LOW (ref 36.0–46.0)
Hemoglobin: 8.1 g/dL — ABNORMAL LOW (ref 12.0–15.0)
Immature Granulocytes: 0 %
Lymphocytes Relative: 14 %
Lymphs Abs: 0.9 10*3/uL (ref 0.7–4.0)
MCH: 33.9 pg (ref 26.0–34.0)
MCHC: 33.1 g/dL (ref 30.0–36.0)
MCV: 102.5 fL — ABNORMAL HIGH (ref 80.0–100.0)
Monocytes Absolute: 1 10*3/uL (ref 0.1–1.0)
Monocytes Relative: 15 %
Neutro Abs: 4.5 10*3/uL (ref 1.7–7.7)
Neutrophils Relative %: 69 %
Platelets: 203 10*3/uL (ref 150–400)
RBC: 2.39 MIL/uL — ABNORMAL LOW (ref 3.87–5.11)
RDW: 15.7 % — ABNORMAL HIGH (ref 11.5–15.5)
WBC: 6.5 10*3/uL (ref 4.0–10.5)
nRBC: 0 % (ref 0.0–0.2)

## 2018-12-21 MED ORDER — HEPARIN SOD (PORK) LOCK FLUSH 100 UNIT/ML IV SOLN
500.0000 [IU] | Freq: Once | INTRAVENOUS | Status: AC
  Administered 2018-12-21: 500 [IU]
  Filled 2018-12-21: qty 5

## 2018-12-21 MED ORDER — SODIUM CHLORIDE 0.9% FLUSH
10.0000 mL | Freq: Once | INTRAVENOUS | Status: AC
  Administered 2018-12-21: 10 mL
  Filled 2018-12-21: qty 10

## 2018-12-22 ENCOUNTER — Inpatient Hospital Stay: Payer: Medicare Other

## 2018-12-22 ENCOUNTER — Other Ambulatory Visit: Payer: Self-pay

## 2018-12-22 ENCOUNTER — Inpatient Hospital Stay (HOSPITAL_BASED_OUTPATIENT_CLINIC_OR_DEPARTMENT_OTHER): Payer: Medicare Other | Admitting: Hematology and Oncology

## 2018-12-22 ENCOUNTER — Telehealth: Payer: Self-pay | Admitting: Hematology and Oncology

## 2018-12-22 DIAGNOSIS — C55 Malignant neoplasm of uterus, part unspecified: Secondary | ICD-10-CM

## 2018-12-22 DIAGNOSIS — D6481 Anemia due to antineoplastic chemotherapy: Secondary | ICD-10-CM

## 2018-12-22 DIAGNOSIS — Z452 Encounter for adjustment and management of vascular access device: Secondary | ICD-10-CM | POA: Diagnosis not present

## 2018-12-22 DIAGNOSIS — C7800 Secondary malignant neoplasm of unspecified lung: Secondary | ICD-10-CM | POA: Diagnosis not present

## 2018-12-22 DIAGNOSIS — T451X5A Adverse effect of antineoplastic and immunosuppressive drugs, initial encounter: Secondary | ICD-10-CM

## 2018-12-22 DIAGNOSIS — N183 Chronic kidney disease, stage 3 unspecified: Secondary | ICD-10-CM

## 2018-12-22 DIAGNOSIS — C787 Secondary malignant neoplasm of liver and intrahepatic bile duct: Secondary | ICD-10-CM

## 2018-12-22 DIAGNOSIS — Z5111 Encounter for antineoplastic chemotherapy: Secondary | ICD-10-CM | POA: Diagnosis not present

## 2018-12-22 DIAGNOSIS — K2900 Acute gastritis without bleeding: Secondary | ICD-10-CM | POA: Diagnosis not present

## 2018-12-22 LAB — CA 125: Cancer Antigen (CA) 125: 15.6 U/mL (ref 0.0–38.1)

## 2018-12-22 MED ORDER — FAMOTIDINE IN NACL 20-0.9 MG/50ML-% IV SOLN
INTRAVENOUS | Status: AC
  Filled 2018-12-22: qty 50

## 2018-12-22 MED ORDER — HEPARIN SOD (PORK) LOCK FLUSH 100 UNIT/ML IV SOLN
500.0000 [IU] | Freq: Once | INTRAVENOUS | Status: AC | PRN
  Administered 2018-12-22: 500 [IU]
  Filled 2018-12-22: qty 5

## 2018-12-22 MED ORDER — FAMOTIDINE IN NACL 20-0.9 MG/50ML-% IV SOLN
20.0000 mg | Freq: Once | INTRAVENOUS | Status: AC
  Administered 2018-12-22: 20 mg via INTRAVENOUS

## 2018-12-22 MED ORDER — SODIUM CHLORIDE 0.9 % IV SOLN
Freq: Once | INTRAVENOUS | Status: AC
  Administered 2018-12-22: 11:00:00 via INTRAVENOUS
  Filled 2018-12-22: qty 5

## 2018-12-22 MED ORDER — SODIUM CHLORIDE 0.9 % IV SOLN
Freq: Once | INTRAVENOUS | Status: AC
  Administered 2018-12-22: 11:00:00 via INTRAVENOUS
  Filled 2018-12-22: qty 250

## 2018-12-22 MED ORDER — SODIUM CHLORIDE 0.9% FLUSH
10.0000 mL | INTRAVENOUS | Status: DC | PRN
  Administered 2018-12-22: 10 mL
  Filled 2018-12-22: qty 10

## 2018-12-22 MED ORDER — DIPHENHYDRAMINE HCL 50 MG/ML IJ SOLN
50.0000 mg | Freq: Once | INTRAMUSCULAR | Status: AC
  Administered 2018-12-22: 50 mg via INTRAVENOUS

## 2018-12-22 MED ORDER — PALONOSETRON HCL INJECTION 0.25 MG/5ML
0.2500 mg | Freq: Once | INTRAVENOUS | Status: AC
  Administered 2018-12-22: 0.25 mg via INTRAVENOUS

## 2018-12-22 MED ORDER — DIPHENHYDRAMINE HCL 50 MG/ML IJ SOLN
INTRAMUSCULAR | Status: AC
  Filled 2018-12-22: qty 1

## 2018-12-22 MED ORDER — SODIUM CHLORIDE 0.9 % IV SOLN
105.0000 mg/m2 | Freq: Once | INTRAVENOUS | Status: AC
  Administered 2018-12-22: 252 mg via INTRAVENOUS
  Filled 2018-12-22: qty 42

## 2018-12-22 MED ORDER — SODIUM CHLORIDE 0.9 % IV SOLN
330.0000 mg | Freq: Once | INTRAVENOUS | Status: AC
  Administered 2018-12-22: 330 mg via INTRAVENOUS
  Filled 2018-12-22: qty 33

## 2018-12-22 MED ORDER — PALONOSETRON HCL INJECTION 0.25 MG/5ML
INTRAVENOUS | Status: AC
  Filled 2018-12-22: qty 5

## 2018-12-22 NOTE — Patient Instructions (Signed)
[facility] Discharge Instructions for Patients Receiving Chemotherapy  Today you received the following chemotherapy agents: Taxol and Carboplatin   To help prevent nausea and vomiting after your treatment, we encourage you to take your nausea medication as directed.    If you develop nausea and vomiting that is not controlled by your nausea medication, call the clinic.   BELOW ARE SYMPTOMS THAT SHOULD BE REPORTED IMMEDIATELY:  *FEVER GREATER THAN 100.5 F  *CHILLS WITH OR WITHOUT FEVER  NAUSEA AND VOMITING THAT IS NOT CONTROLLED WITH YOUR NAUSEA MEDICATION  *UNUSUAL SHORTNESS OF BREATH  *UNUSUAL BRUISING OR BLEEDING  TENDERNESS IN MOUTH AND THROAT WITH OR WITHOUT PRESENCE OF ULCERS  *URINARY PROBLEMS  *BOWEL PROBLEMS  UNUSUAL RASH Items with * indicate a potential emergency and should be followed up as soon as possible.  Feel free to call the clinic should you have any questions or concerns. The clinic phone number is [phone].  Please show the CHEMO ALERT CARD at check-in to the Emergency Department and triage nurse.  Coronavirus (COVID-19) Are you at risk?  Are you at risk for the Coronavirus (COVID-19)?  To be considered HIGH RISK for Coronavirus (COVID-19), you have to meet the following criteria:  . Traveled to [place], [place], [place], [place] or [place]; or in the [place] to [place], [place], [place], or [place]; and have fever, cough, and shortness of breath within the last 2 weeks of travel OR . Been in close contact with a person diagnosed with COVID-19 within the last 2 weeks and have fever, cough, and shortness of breath . IF YOU DO NOT MEET THESE CRITERIA, YOU ARE CONSIDERED LOW RISK FOR COVID-19.  What to do if you are HIGH RISK for COVID-19?  . If you are having a medical emergency, call 911. . Seek medical care right away. Before you go to a doctor's office, urgent care or emergency department, call ahead and tell them  about your recent travel, contact with someone diagnosed with COVID-19, and your symptoms. You should receive instructions from your physician's office regarding next steps of care.  . When you arrive at healthcare provider, tell the healthcare staff immediately you have returned from visiting [place], [place], [place], [place] or [place]; or traveled in the [place] to [place], [place], [place], or [place]; in the last two weeks or you have been in close contact with a person diagnosed with COVID-19 in the last 2 weeks.   . Tell the health care staff about your symptoms: fever, cough and shortness of breath. . After you have been seen by a medical provider, you will be either: o Tested for (COVID-19) and discharged home on quarantine except to seek medical care if symptoms worsen, and asked to  - Stay home and avoid contact with others until you get your results (4-5 days)  - Avoid travel on public transportation if possible (such as bus, train, or airplane) or o Sent to the Emergency Department by EMS for evaluation, COVID-19 testing, and possible admission depending on your condition and test results.  What to do if you are LOW RISK for COVID-19?  Reduce your risk of any infection by using the same precautions used for avoiding the common cold or flu:  . Wash your hands often with soap and warm water for at least 20 seconds.  If soap and water are not readily available, use an alcohol-based hand sanitizer with at least 60% alcohol.  .   If coughing or sneezing, cover your mouth and nose by coughing or sneezing into the elbow areas of your shirt or coat, into a tissue or into your sleeve (not your hands). . Avoid shaking hands with others and consider head nods or verbal greetings only. . Avoid touching your eyes, nose, or mouth with unwashed hands.  . Avoid close contact with people who are sick. . Avoid places or events with large numbers of people in one location, like concerts or  sporting events. . Carefully consider travel plans you have or are making. . If you are planning any travel outside or inside the [place], visit the CDC's Travelers' Health webpage for the latest health notices. . If you have some symptoms but not all symptoms, continue to monitor at home and seek medical attention if your symptoms worsen. . If you are having a medical emergency, call 911.   ADDITIONAL HEALTHCARE OPTIONS FOR PATIENTS  [facility] [facility] / e-Visit: [email or web address]         [facility]: [phone]  [facility]: [phone]                   [facility]: [phone]   

## 2018-12-22 NOTE — Telephone Encounter (Signed)
Scheduled appt per 7/22 sch message- pt to get an updated schedule in treatment area.

## 2018-12-23 ENCOUNTER — Encounter: Payer: Self-pay | Admitting: Hematology and Oncology

## 2018-12-23 ENCOUNTER — Telehealth: Payer: Self-pay | Admitting: Hematology and Oncology

## 2018-12-23 NOTE — Assessment & Plan Note (Signed)
She has stable CKD stage III Observe closely for now Planned dose adjustment

## 2018-12-23 NOTE — Progress Notes (Signed)
[place] OFFICE PROGRESS NOTE  Patient Care Team: [name], DO as PCP - General (Family Medicine) [name], MD as Consulting Physician (Ophthalmology) [name], DPM as Consulting Physician (Podiatry) [name], [name], MD as Referring Physician (Optometry) [name], MD as Consulting Physician (Orthopedic Surgery) [name], RN as Oncology Nurse Navigator (Oncology)  ASSESSMENT & PLAN:  Uterine cancer [facility]) I have reviewed blood work and CT imaging with the patient and daughter over the telephone She has responded very well to treatment, albeit with some side effects including anemia requiring blood transfusion We discussed the risk and benefits of continuing 3 more cycles of therapy and ultimately, the patient is in agreement to continue 3 more cycles of therapy before repeating imaging study  Anemia due to antineoplastic chemotherapy She is profoundly anemic due to side effects of treatment I will schedule blood transfusion appointment next week We will continue modified dosing of chemotherapy  CKD (chronic kidney disease), stage III ([place]) She has stable CKD stage III Observe closely for now Planned dose adjustment  Metastasis to lung ([place]) Lung nodules are stable.  Observe only   No orders of the defined types were placed in this encounter.   INTERVAL HISTORY: Please see below for problem oriented charting. 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 Ultrasound irregular hypoechoic mass 7 x 6 x 5 mm   01/06/2012 Initial Biopsy   Initial biopsy was benign . 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 Cervix, biopsy - HIGH GRADE CARCINOMA. - SEE COMMENT. 2. Endocervix, curettage - HIGH GRADE CARCINOMA. - SEE COMMENT. 3. Endometrium, biopsy - HIGH GRADE CARCINOMA. - SEE COMMENT. Microscopic Comment 1. - 3. The carcinoma in the three specimens is morphologically similar and has features suggestive of papillary serous carcinoma.   07/26/2018 Tumor Marker   Patient's tumor was tested for the following markers: CA-125 Results of the tumor marker test revealed 835   07/28/2018 Imaging   1. Several (at least 10) solid pulmonary nodules scattered throughout both lungs are new since 2012 chest CT, likely representing pulmonary metastases, largest 6 mm, below PET resolution. 2. Redemonstration of hypodense 2.5 cm segment 7 right liver lobe mass, indeterminate, suspicious for liver metastasis. 3. Redemonstration of upper left retroperitoneal  metastatic adenopathy. 4. Three-vessel coronary atherosclerosis.  Aortic Atherosclerosis (ICD10-I70.0).   08/03/2018 Cancer Staging   Staging form: Corpus Uteri - Carcinoma and  Carcinosarcoma, AJCC 8th Edition - Clinical: FIGO Stage IVB (cT3b, cN2, cM1) - Signed by [name], MD on 08/03/2018   08/05/2018 Tumor Marker   Patient's tumor was tested for the following markers: CA-125 Results of the tumor marker test revealed 901   08/06/2018 Procedure   Placement of single lumen port a cath via right internal jugular vein. The catheter tip lies at the cavo-atrial junction. A power injectable port a cath was placed and is ready for immediate use.    08/11/2018 -  Chemotherapy   The patient had carboplatin and taxol   09/01/2018 Tumor Marker   Patient's tumor was tested for the following markers: CA-125 Results of the tumor marker test revealed 406   09/09/2018 Genetic Testing   The Common Hereditary Cancers Panel offered by [facility] includes sequencing and/or deletion duplication testing of the following 48 genes: APC, ATM, AXIN2, BARD1, BMPR1A, BRCA1, BRCA2, BRIP1, CDH1, CDKN2A (p14ARF), CDKN2A (p16INK4a), CKD4, CHEK2, CTNNA1, DICER1, EPCAM (Deletion/duplication testing only), GREM1 (promoter region deletion/duplication testing only), KIT, MEN1, MLH1, MSH2, MSH3, MSH6, MUTYH, NBN, NF1, NHTL1, PALB2, PDGFRA, PMS2, POLD1, POLE, PTEN, RAD50, RAD51C, RAD51D, RNF43, SDHB, SDHC, SDHD, SMAD4, SMARCA4. STK11, TP53, TSC1, TSC2, and VHL.  The following genes were evaluated for sequence changes only: SDHA and HOXB13 c.251G>A variant only.  Results: Negative, no pathogenic variants identified. The report date is 09/09/2018.     09/22/2018 Tumor Marker   Patient's tumor was tested for the following markers: CA-125 Results of the tumor marker test revealed 110   10/12/2018 Tumor Marker   Patient's tumor was tested for the following markers: CA-125 Results of the tumor marker test revealed 55.3   10/12/2018 Imaging   1. Mild decrease in pulmonary metastases, liver metastases, and metastatic abdominal and pelvic lymphadenopathy. 2. Decreased size of complex cystic lesion in left presacral  region. 3. No new or progressive metastatic disease identified. 4. Stable small right inguinal hernia containing small portion of urinary bladder.   11/03/2018 Tumor Marker   Patient's tumor was tested for the following markers: CA-125 Results of the tumor marker test revealed 34.3    Genetic Testing   Patient has genetic testing done for HER2. Results revealed patient has the following: HER2 - negative from pathology from 07/26/18.   12/20/2018 Imaging   1. Possible new omental nodule. 2. Improving abdominal and pelvic retroperitoneal adenopathy. 3. Stable pulmonary and hepatic metastatic disease. 4. Left perirectal/presacral fluid collection has decreased in size. 5. Right inguinal hernia contains a small portion of the bladder, as before. 6. Aortic atherosclerosis (ICD10-170.0). Coronary artery calcification   12/21/2018 Tumor Marker   Patient's tumor was tested for the following markers: CA-125 Results of the tumor marker test revealed 15.6     REVIEW OF SYSTEMS:   Constitutional: Denies fevers, chills or abnormal weight loss Eyes: Denies blurriness of vision Ears, nose, mouth, throat, and face: Denies mucositis or sore throat Respiratory: Denies cough, dyspnea or wheezes Cardiovascular: Denies palpitation, chest discomfort or lower extremity swelling Gastrointestinal:  Denies nausea, heartburn or change in bowel habits Skin: Denies abnormal skin rashes Lymphatics: Denies new lymphadenopathy or easy bruising Neurological:Denies numbness, tingling or new weaknesses Behavioral/Psych: Mood is stable, no new changes  All other systems were reviewed with the patient and are negative.  I have reviewed the past medical history, past surgical history, social history and  family history with the patient and they are unchanged from previous note.  ALLERGIES:  has No Known Allergies.  MEDICATIONS:  Current Outpatient Medications  Medication Sig Dispense Refill  . acetaminophen  (TYLENOL) 500 MG tablet Take 1 tablet (500 mg total) by mouth every 6 (six) hours as needed. (Patient taking differently: Take 500 mg by mouth every 6 (six) hours as needed for moderate pain. ) 30 tablet 3  . ADVAIR DISKUS 100-50 MCG/DOSE AEPB USE 1 INHALATION BY MOUTH TWICE DAILY 60 each 0  . Calcium Carbonate (CALTRATE 600 PO) Take 1 tablet by mouth 2 (two) times daily.     Marland Kitchen dexamethasone (DECADRON) 4 MG tablet Take 2 tabs the night before and 2 tabs on the morning of chemotherapy every 3 weeks with food 12 tablet 1  . enalapril (VASOTEC) 20 MG tablet TAKE 1 TABLET(20 MG) BY MOUTH TWICE DAILY 180 tablet 2  . furosemide (LASIX) 40 MG tablet TAKE 1 TABLET BY MOUTH DAILY AS NEEDED FOR LEG SWELLING 90 tablet 2  . hydrochlorothiazide (HYDRODIURIL) 25 MG tablet TAKE 1 TABLET(25 MG) BY MOUTH DAILY 90 tablet 1  . lidocaine-prilocaine (EMLA) cream Apply to affected area once 30 g 3  . Multiple Vitamin (MULTIVITAMIN) tablet Take 1 tablet by mouth daily.    . Multiple Vitamins-Minerals (ICAPS AREDS 2 PO) Take 1 capsule by mouth 2 (two) times daily.    . Multiple Vitamins-Minerals (WOMENS BONE HEALTH PO) Take 1 tablet by mouth daily.    Marland Kitchen omeprazole (PRILOSEC) 40 MG capsule Take 1 capsule (40 mg total) by mouth daily. 30 capsule 5  . ondansetron (ZOFRAN) 8 MG tablet Take 1 tablet (8 mg total) by mouth every 8 (eight) hours as needed. Start on the third day after chemotherapy. 30 tablet 1  . prochlorperazine (COMPAZINE) 10 MG tablet Take 1 tablet (10 mg total) by mouth every 6 (six) hours as needed (Nausea or vomiting). 30 tablet 1  . senna (SENOKOT) 8.6 MG TABS tablet Take 1 tablet (8.6 mg total) by mouth daily. 120 each 0  . Simethicone 80 MG TABS Take 1 tablet (80 mg total) by mouth QID. 120 tablet 2  . Vitamin D, Cholecalciferol, 1000 units CAPS Take by mouth daily.    Alveda Reasons 20 MG TABS tablet TAKE 1 TABLET BY MOUTH DAILY 90 tablet 0   No current facility-administered medications for this visit.      PHYSICAL EXAMINATION: ECOG PERFORMANCE STATUS: 1 - Symptomatic but completely ambulatory  Vitals:   12/22/18 0952  BP: 133/69  Pulse: 96  Resp: 17  Temp: 98.9 F (37.2 C)  SpO2: 100%   Filed Weights   12/22/18 0952  Weight: 255 lb 12.8 oz (116 kg)    GENERAL:alert, no distress and comfortable SKIN: skin color, texture, turgor are normal, no rashes or significant lesions EYES: normal, Conjunctiva are pink and non-injected, sclera clear OROPHARYNX:no exudate, no erythema and lips, buccal mucosa, and tongue normal  NECK: supple, thyroid normal size, non-tender, without nodularity LYMPH:  no palpable lymphadenopathy in the cervical, axillary or inguinal LUNGS: clear to auscultation and percussion with normal breathing effort HEART: regular rate & rhythm and no murmurs and no lower extremity edema ABDOMEN:abdomen soft, non-tender and normal bowel sounds Musculoskeletal:no cyanosis of digits and no clubbing  NEURO: alert & oriented x 3 with fluent speech, no focal motor/sensory deficits  LABORATORY DATA:  I have reviewed the data as listed    Component Value Date/Time   NA 140 12/21/2018 1005  NA 142 12/25/2014 1122   K 3.4 (L) 12/21/2018 1005   K 4.1 12/25/2014 1122   CL 102 12/21/2018 1005   CL 102 10/04/2012 1159   CO2 27 12/21/2018 1005   CO2 29 12/25/2014 1122   GLUCOSE 97 12/21/2018 1005   GLUCOSE 99 12/25/2014 1122   GLUCOSE 116 (H) 10/04/2012 1159   BUN 13 12/21/2018 1005   BUN 13.7 12/25/2014 1122   CREATININE 1.05 (H) 12/21/2018 1005   CREATININE 1.08 (H) 12/02/2018 1432   CREATININE 0.96 (H) 02/27/2016 1530   CREATININE 0.9 12/25/2014 1122   CALCIUM 9.6 12/21/2018 1005   CALCIUM 10.1 12/25/2014 1122   PROT 7.4 12/21/2018 1005   PROT 7.8 12/25/2014 1122   ALBUMIN 3.1 (L) 12/21/2018 1005   ALBUMIN 3.4 (L) 12/25/2014 1122   AST 16 12/21/2018 1005   AST 22 12/02/2018 1432   AST 21 12/25/2014 1122   ALT 7 12/21/2018 1005   ALT 13 12/02/2018 1432    ALT 17 12/25/2014 1122   ALKPHOS 68 12/21/2018 1005   ALKPHOS 99 12/25/2014 1122   BILITOT 0.3 12/21/2018 1005   BILITOT <0.3 (L) 12/02/2018 1432   BILITOT 0.41 12/25/2014 1122   GFRNONAA 50 (L) 12/21/2018 1005   GFRNONAA 49 (L) 12/02/2018 1432   GFRNONAA 58 (L) 02/27/2016 1530   GFRAA 58 (L) 12/21/2018 1005   GFRAA 57 (L) 12/02/2018 1432   GFRAA 66 02/27/2016 1530    No results found for: SPEP, UPEP  Lab Results  Component Value Date   WBC 6.5 12/21/2018   NEUTROABS 4.5 12/21/2018   HGB 8.1 (L) 12/21/2018   HCT 24.5 (L) 12/21/2018   MCV 102.5 (H) 12/21/2018   PLT 203 12/21/2018      Chemistry      Component Value Date/Time   NA 140 12/21/2018 1005   NA 142 12/25/2014 1122   K 3.4 (L) 12/21/2018 1005   K 4.1 12/25/2014 1122   CL 102 12/21/2018 1005   CL 102 10/04/2012 1159   CO2 27 12/21/2018 1005   CO2 29 12/25/2014 1122   BUN 13 12/21/2018 1005   BUN 13.7 12/25/2014 1122   CREATININE 1.05 (H) 12/21/2018 1005   CREATININE 1.08 (H) 12/02/2018 1432   CREATININE 0.96 (H) 02/27/2016 1530   CREATININE 0.9 12/25/2014 1122      Component Value Date/Time   CALCIUM 9.6 12/21/2018 1005   CALCIUM 10.1 12/25/2014 1122   ALKPHOS 68 12/21/2018 1005   ALKPHOS 99 12/25/2014 1122   AST 16 12/21/2018 1005   AST 22 12/02/2018 1432   AST 21 12/25/2014 1122   ALT 7 12/21/2018 1005   ALT 13 12/02/2018 1432   ALT 17 12/25/2014 1122   BILITOT 0.3 12/21/2018 1005   BILITOT <0.3 (L) 12/02/2018 1432   BILITOT 0.41 12/25/2014 1122       RADIOGRAPHIC STUDIES: I have reviewed multiple imaging studies with the patient I have personally reviewed the radiological images as listed and agreed with the findings in the report. Ct Chest W Contrast  Result Date: 12/20/2018 CLINICAL DATA:  Endometrial cancer, ongoing chemotherapy. History of left breast cancer. Weight loss. EXAM: CT CHEST, ABDOMEN, AND PELVIS WITH CONTRAST TECHNIQUE: Multidetector CT imaging of the chest, abdomen and  pelvis was performed following the standard protocol during bolus administration of intravenous contrast. CONTRAST:  139m OMNIPAQUE IOHEXOL 300 MG/ML  SOLN COMPARISON:  10/12/2018. FINDINGS: CT CHEST FINDINGS Cardiovascular: Right IJ Port-A-Cath terminates in the high right atrium. Atherosclerotic calcification of  the aorta and coronary arteries. Heart is mildly enlarged. No pericardial effusion. Mediastinum/Nodes: No pathologically enlarged mediastinal, hilar or axillary lymph nodes. Esophagus is grossly unremarkable. Lungs/Pleura: Upper lobe peribronchovascular nodularity is similar. A few scattered discrete pulmonary nodules measure 4 mm or less in size, as before. Mild scarring in the lower lobes. No pleural fluid. Airway is unremarkable. Musculoskeletal: No worrisome lytic or sclerotic lesions. CT ABDOMEN PELVIS FINDINGS Hepatobiliary: A somewhat ill-defined lesion in the dome of the liver measures 1.1 cm (series 2, image 43), stable. Additional low-attenuation lesions in the liver are also stable. Liver and gallbladder are otherwise unremarkable. No biliary ductal dilatation. Pancreas: Negative. Spleen: Negative. Adrenals/Urinary Tract: Adrenal glands and right kidney are unremarkable. 2.6 cm fluid density lesion in the upper pole left kidney is likely a cyst. Subcentimeter low-attenuation lesion in the lower pole left kidney is too small to characterize. Ureters are decompressed. Bladder is low in volume. The right anterolateral aspect of the bladder extends into a right inguinal hernia. Stomach/Bowel: Stomach, small bowel, appendix and colon are unremarkable. Vascular/Lymphatic: Atherosclerotic calcification of the aorta without aneurysm. Abdominal retroperitoneal lymph nodes have decreased in size in the interval. Index left periaortic lymph node (series 2, image 68) measures 9 mm, compared to 1.4 cm previously. Right common iliac lymph node has decreased slightly in size, measuring 9 mm (84), compared to  1.4 cm on 10/12/2018. Right external iliac lymph node measures 5 mm (95), previously 1.4 cm. Left chain lymph nodes measure up to 11 mm along the left external iliac vessels, stable. Inguinal lymph nodes have fatty hila and measure up to 1.8 cm on the left, also similar. Reproductive: Uterus is visualized.  No adnexal mass. Other: Possible omental nodule in the midline ventral abdomen, measuring 7 mm (series 2, image 76). Right inguinal hernia contains the right anterolateral aspect of the bladder, as before. Small amount of fluid in the left perirectal fat measures 1.4 x 3.0 cm, decreased from 3.2 x 5.3 cm on 10/12/2018. Musculoskeletal: No worrisome lytic or sclerotic lesions. IMPRESSION: 1. Possible new omental nodule. 2. Improving abdominal and pelvic retroperitoneal adenopathy. 3. Stable pulmonary and hepatic metastatic disease. 4. Left perirectal/presacral fluid collection has decreased in size. 5. Right inguinal hernia contains a small portion of the bladder, as before. 6. Aortic atherosclerosis (ICD10-170.0). Coronary artery calcification. Electronically Signed   By: Lorin Picket M.D.   On: 12/20/2018 16:07   Ct Abdomen Pelvis W Contrast  Result Date: 12/20/2018 CLINICAL DATA:  Endometrial cancer, ongoing chemotherapy. History of left breast cancer. Weight loss. EXAM: CT CHEST, ABDOMEN, AND PELVIS WITH CONTRAST TECHNIQUE: Multidetector CT imaging of the chest, abdomen and pelvis was performed following the standard protocol during bolus administration of intravenous contrast. CONTRAST:  128m OMNIPAQUE IOHEXOL 300 MG/ML  SOLN COMPARISON:  10/12/2018. FINDINGS: CT CHEST FINDINGS Cardiovascular: Right IJ Port-A-Cath terminates in the high right atrium. Atherosclerotic calcification of the aorta and coronary arteries. Heart is mildly enlarged. No pericardial effusion. Mediastinum/Nodes: No pathologically enlarged mediastinal, hilar or axillary lymph nodes. Esophagus is grossly unremarkable. Lungs/Pleura:  Upper lobe peribronchovascular nodularity is similar. A few scattered discrete pulmonary nodules measure 4 mm or less in size, as before. Mild scarring in the lower lobes. No pleural fluid. Airway is unremarkable. Musculoskeletal: No worrisome lytic or sclerotic lesions. CT ABDOMEN PELVIS FINDINGS Hepatobiliary: A somewhat ill-defined lesion in the dome of the liver measures 1.1 cm (series 2, image 43), stable. Additional low-attenuation lesions in the liver are also stable. Liver and gallbladder are  otherwise unremarkable. No biliary ductal dilatation. Pancreas: Negative. Spleen: Negative. Adrenals/Urinary Tract: Adrenal glands and right kidney are unremarkable. 2.6 cm fluid density lesion in the upper pole left kidney is likely a cyst. Subcentimeter low-attenuation lesion in the lower pole left kidney is too small to characterize. Ureters are decompressed. Bladder is low in volume. The right anterolateral aspect of the bladder extends into a right inguinal hernia. Stomach/Bowel: Stomach, small bowel, appendix and colon are unremarkable. Vascular/Lymphatic: Atherosclerotic calcification of the aorta without aneurysm. Abdominal retroperitoneal lymph nodes have decreased in size in the interval. Index left periaortic lymph node (series 2, image 68) measures 9 mm, compared to 1.4 cm previously. Right common iliac lymph node has decreased slightly in size, measuring 9 mm (84), compared to 1.4 cm on 10/12/2018. Right external iliac lymph node measures 5 mm (95), previously 1.4 cm. Left chain lymph nodes measure up to 11 mm along the left external iliac vessels, stable. Inguinal lymph nodes have fatty hila and measure up to 1.8 cm on the left, also similar. Reproductive: Uterus is visualized.  No adnexal mass. Other: Possible omental nodule in the midline ventral abdomen, measuring 7 mm (series 2, image 76). Right inguinal hernia contains the right anterolateral aspect of the bladder, as before. Small amount of fluid in  the left perirectal fat measures 1.4 x 3.0 cm, decreased from 3.2 x 5.3 cm on 10/12/2018. Musculoskeletal: No worrisome lytic or sclerotic lesions. IMPRESSION: 1. Possible new omental nodule. 2. Improving abdominal and pelvic retroperitoneal adenopathy. 3. Stable pulmonary and hepatic metastatic disease. 4. Left perirectal/presacral fluid collection has decreased in size. 5. Right inguinal hernia contains a small portion of the bladder, as before. 6. Aortic atherosclerosis (ICD10-170.0). Coronary artery calcification. Electronically Signed   By: Lorin Picket M.D.   On: 12/20/2018 16:07    All questions were answered. The patient knows to call the clinic with any problems, questions or concerns. No barriers to learning was detected.  I spent 25 minutes counseling the patient face to face. The total time spent in the appointment was 30 minutes and more than 50% was on counseling and review of test results  [name], MD 12/23/2018 8:46 AM

## 2018-12-23 NOTE — Telephone Encounter (Signed)
Talk with patient regarding schedule °

## 2018-12-23 NOTE — Assessment & Plan Note (Signed)
She is profoundly anemic due to side effects of treatment I will schedule blood transfusion appointment next week We will continue modified dosing of chemotherapy

## 2018-12-23 NOTE — Assessment & Plan Note (Signed)
I have reviewed blood work and CT imaging with the patient and daughter over the telephone She has responded very well to treatment, albeit with some side effects including anemia requiring blood transfusion We discussed the risk and benefits of continuing 3 more cycles of therapy and ultimately, the patient is in agreement to continue 3 more cycles of therapy before repeating imaging study

## 2018-12-23 NOTE — Assessment & Plan Note (Signed)
Lung nodules are stable.  Observe only

## 2018-12-29 ENCOUNTER — Other Ambulatory Visit: Payer: Self-pay

## 2018-12-29 ENCOUNTER — Inpatient Hospital Stay: Payer: Medicare Other

## 2018-12-29 ENCOUNTER — Other Ambulatory Visit: Payer: Self-pay | Admitting: *Deleted

## 2018-12-29 ENCOUNTER — Other Ambulatory Visit: Payer: Self-pay | Admitting: Hematology and Oncology

## 2018-12-29 DIAGNOSIS — T451X5A Adverse effect of antineoplastic and immunosuppressive drugs, initial encounter: Secondary | ICD-10-CM

## 2018-12-29 DIAGNOSIS — D6481 Anemia due to antineoplastic chemotherapy: Secondary | ICD-10-CM

## 2018-12-29 DIAGNOSIS — Z5111 Encounter for antineoplastic chemotherapy: Secondary | ICD-10-CM | POA: Diagnosis not present

## 2018-12-29 DIAGNOSIS — C787 Secondary malignant neoplasm of liver and intrahepatic bile duct: Secondary | ICD-10-CM | POA: Diagnosis not present

## 2018-12-29 DIAGNOSIS — N183 Chronic kidney disease, stage 3 (moderate): Secondary | ICD-10-CM | POA: Diagnosis not present

## 2018-12-29 DIAGNOSIS — K2900 Acute gastritis without bleeding: Secondary | ICD-10-CM | POA: Diagnosis not present

## 2018-12-29 DIAGNOSIS — C7800 Secondary malignant neoplasm of unspecified lung: Secondary | ICD-10-CM | POA: Diagnosis not present

## 2018-12-29 DIAGNOSIS — Z452 Encounter for adjustment and management of vascular access device: Secondary | ICD-10-CM | POA: Diagnosis not present

## 2018-12-29 DIAGNOSIS — C55 Malignant neoplasm of uterus, part unspecified: Secondary | ICD-10-CM

## 2018-12-29 LAB — CBC WITH DIFFERENTIAL/PLATELET
Abs Immature Granulocytes: 0.02 10*3/uL (ref 0.00–0.07)
Basophils Absolute: 0 10*3/uL (ref 0.0–0.1)
Basophils Relative: 0 %
Eosinophils Absolute: 0.1 10*3/uL (ref 0.0–0.5)
Eosinophils Relative: 3 %
HCT: 22.9 % — ABNORMAL LOW (ref 36.0–46.0)
Hemoglobin: 7.7 g/dL — ABNORMAL LOW (ref 12.0–15.0)
Immature Granulocytes: 1 %
Lymphocytes Relative: 28 %
Lymphs Abs: 0.6 10*3/uL — ABNORMAL LOW (ref 0.7–4.0)
MCH: 34.2 pg — ABNORMAL HIGH (ref 26.0–34.0)
MCHC: 33.6 g/dL (ref 30.0–36.0)
MCV: 101.8 fL — ABNORMAL HIGH (ref 80.0–100.0)
Monocytes Absolute: 0.2 10*3/uL (ref 0.1–1.0)
Monocytes Relative: 9 %
Neutro Abs: 1.4 10*3/uL — ABNORMAL LOW (ref 1.7–7.7)
Neutrophils Relative %: 59 %
Platelets: 161 10*3/uL (ref 150–400)
RBC: 2.25 MIL/uL — ABNORMAL LOW (ref 3.87–5.11)
RDW: 14.9 % (ref 11.5–15.5)
WBC: 2.3 10*3/uL — ABNORMAL LOW (ref 4.0–10.5)
nRBC: 0 % (ref 0.0–0.2)

## 2018-12-29 LAB — COMPREHENSIVE METABOLIC PANEL
ALT: 11 U/L (ref 0–44)
AST: 18 U/L (ref 15–41)
Albumin: 3.3 g/dL — ABNORMAL LOW (ref 3.5–5.0)
Alkaline Phosphatase: 59 U/L (ref 38–126)
Anion gap: 10 (ref 5–15)
BUN: 17 mg/dL (ref 8–23)
CO2: 28 mmol/L (ref 22–32)
Calcium: 9.9 mg/dL (ref 8.9–10.3)
Chloride: 101 mmol/L (ref 98–111)
Creatinine, Ser: 0.98 mg/dL (ref 0.44–1.00)
GFR calc Af Amer: >60 mL/min (ref >60)
GFR calc non Af Amer: 55 mL/min — ABNORMAL LOW (ref >60)
Glucose, Bld: 92 mg/dL (ref 70–99)
Potassium: 3.3 mmol/L — ABNORMAL LOW (ref 3.5–5.1)
Sodium: 139 mmol/L (ref 135–145)
Total Bilirubin: 0.4 mg/dL (ref 0.3–1.2)
Total Protein: 7.3 g/dL (ref 6.5–8.1)

## 2018-12-29 LAB — SAMPLE TO BLOOD BANK

## 2018-12-29 LAB — PREPARE RBC (CROSSMATCH)

## 2018-12-29 MED ORDER — SODIUM CHLORIDE 0.9% FLUSH
10.0000 mL | INTRAVENOUS | Status: AC | PRN
  Administered 2018-12-29: 10 mL
  Filled 2018-12-29: qty 10

## 2018-12-29 MED ORDER — ACETAMINOPHEN 325 MG PO TABS
ORAL_TABLET | ORAL | Status: AC
  Filled 2018-12-29: qty 2

## 2018-12-29 MED ORDER — DIPHENHYDRAMINE HCL 25 MG PO CAPS
25.0000 mg | ORAL_CAPSULE | Freq: Once | ORAL | Status: AC
  Administered 2018-12-29: 25 mg via ORAL

## 2018-12-29 MED ORDER — SODIUM CHLORIDE 0.9% IV SOLUTION
250.0000 mL | Freq: Once | INTRAVENOUS | Status: AC
  Administered 2018-12-29: 250 mL via INTRAVENOUS
  Filled 2018-12-29: qty 250

## 2018-12-29 MED ORDER — ACETAMINOPHEN 325 MG PO TABS
650.0000 mg | ORAL_TABLET | Freq: Once | ORAL | Status: AC
  Administered 2018-12-29: 650 mg via ORAL

## 2018-12-29 MED ORDER — DIPHENHYDRAMINE HCL 25 MG PO CAPS
ORAL_CAPSULE | ORAL | Status: AC
  Filled 2018-12-29: qty 1

## 2018-12-29 MED ORDER — HEPARIN SOD (PORK) LOCK FLUSH 100 UNIT/ML IV SOLN
500.0000 [IU] | Freq: Every day | INTRAVENOUS | Status: AC | PRN
  Administered 2018-12-29: 500 [IU]
  Filled 2018-12-29: qty 5

## 2018-12-29 NOTE — Patient Instructions (Signed)
Blood Transfusion, Adult, Care After This sheet gives you information about how to care for yourself after your procedure. Your doctor may also give you more specific instructions. If you have problems or questions, contact your doctor. Follow these instructions at home:   Take over-the-counter and prescription medicines only as told by your doctor.  Go back to your normal activities as told by your doctor.  Follow instructions from your doctor about how to take care of the area where an IV tube was put into your vein (insertion site). Make sure you: ? Wash your hands with soap and water before you change your bandage (dressing). If there is no soap and water, use hand sanitizer. ? Change your bandage as told by your doctor.  Check your IV insertion site every day for signs of infection. Check for: ? More redness, swelling, or pain. ? More fluid or blood. ? Warmth. ? Pus or a bad smell. Contact a doctor if:  You have more redness, swelling, or pain around the IV insertion site.  You have more fluid or blood coming from the IV insertion site.  Your IV insertion site feels warm to the touch.  You have pus or a bad smell coming from the IV insertion site.  Your pee (urine) turns pink, red, or brown.  You feel weak after doing your normal activities. Get help right away if:  You have signs of a serious allergic or body defense (immune) system reaction, including: ? Itchiness. ? Hives. ? Trouble breathing. ? Anxiety. ? Pain in your chest or lower back. ? Fever, flushing, and chills. ? Fast pulse. ? Rash. ? Watery poop (diarrhea). ? Throwing up (vomiting). ? Dark pee. ? Serious headache. ? Dizziness. ? Stiff neck. ? Yellow color in your face or the white parts of your eyes (jaundice). Summary  After a blood transfusion, return to your normal activities as told by your doctor.  Every day, check for signs of infection where the IV tube was put into your vein.  Some  signs of infection are warm skin, more redness and pain, more fluid or blood, and pus or a bad smell where the needle went in.  Contact your doctor if you feel weak or have any unusual symptoms. This information is not intended to replace advice given to you by your health care provider. Make sure you discuss any questions you have with your health care provider. Document Released: 06/09/2014 Document Revised: 09/23/2017 Document Reviewed: 01/11/2016 Elsevier Patient Education  2020 [employer]  Coronavirus (COVID-19) Are you at risk?  Are you at risk for the Coronavirus (COVID-19)?  To be considered HIGH RISK for Coronavirus (COVID-19), you have to meet the following criteria:  . Traveled to [place], [place], [place], [place] or [place]; or in the [place] to [place], [place], [place], or [place]; and have fever, cough, and shortness of breath within the last 2 weeks of travel OR . Been in close contact with a person diagnosed with COVID-19 within the last 2 weeks and have fever, cough, and shortness of breath . IF YOU DO NOT MEET THESE CRITERIA, YOU ARE CONSIDERED LOW RISK FOR COVID-19.  What to do if you are HIGH RISK for COVID-19?  . If you are having a medical emergency, call 911. . Seek medical care right away. Before you go to a doctor's office, urgent care or emergency department, call ahead and tell them about your recent travel, contact with someone diagnosed with COVID-19, and   your symptoms. You should receive instructions from your physician's office regarding next steps of care.  . When you arrive at healthcare provider, tell the healthcare staff immediately you have returned from visiting [place], [place], [place], [place] or [place]; or traveled in the [place] to [place], [place], [place], or [place]; in the last two weeks or you have been in close contact with a person diagnosed with COVID-19 in the last 2 weeks.   . Tell the health care staff about  your symptoms: fever, cough and shortness of breath. . After you have been seen by a medical provider, you will be either: o Tested for (COVID-19) and discharged home on quarantine except to seek medical care if symptoms worsen, and asked to  - Stay home and avoid contact with others until you get your results (4-5 days)  - Avoid travel on public transportation if possible (such as bus, train, or airplane) or o Sent to the Emergency Department by EMS for evaluation, COVID-19 testing, and possible admission depending on your condition and test results.  What to do if you are LOW RISK for COVID-19?  Reduce your risk of any infection by using the same precautions used for avoiding the common cold or flu:  . Wash your hands often with soap and warm water for at least 20 seconds.  If soap and water are not readily available, use an alcohol-based hand sanitizer with at least 60% alcohol.  . If coughing or sneezing, cover your mouth and nose by coughing or sneezing into the elbow areas of your shirt or coat, into a tissue or into your sleeve (not your hands). . Avoid shaking hands with others and consider head nods or verbal greetings only. . Avoid touching your eyes, nose, or mouth with unwashed hands.  . Avoid close contact with people who are sick. . Avoid places or events with large numbers of people in one location, like concerts or sporting events. . Carefully consider travel plans you have or are making. . If you are planning any travel outside or inside the [place], visit the CDC's Travelers' Health webpage for the latest health notices. . If you have some symptoms but not all symptoms, continue to monitor at home and seek medical attention if your symptoms worsen. . If you are having a medical emergency, call 911.   ADDITIONAL HEALTHCARE OPTIONS FOR PATIENTS  [facility] / e-Visit: [email or web address]         [facility]:  [phone]   Urgent Care: [phone]                   [facility]: [phone]   

## 2018-12-29 NOTE — Patient Instructions (Signed)

## 2018-12-30 LAB — TYPE AND SCREEN
ABO/RH(D): O POS
Antibody Screen: NEGATIVE
Unit division: 0

## 2018-12-30 LAB — BPAM RBC
Blood Product Expiration Date: 202008252359
ISSUE DATE / TIME: 202007291004
Unit Type and Rh: 5100

## 2019-01-03 ENCOUNTER — Other Ambulatory Visit: Payer: Self-pay | Admitting: Family Medicine

## 2019-01-03 NOTE — Telephone Encounter (Signed)
See Rx request ° °

## 2019-01-05 ENCOUNTER — Inpatient Hospital Stay: Payer: Medicare Other

## 2019-01-05 ENCOUNTER — Inpatient Hospital Stay: Payer: Medicare Other | Attending: Gynecologic Oncology

## 2019-01-05 ENCOUNTER — Other Ambulatory Visit: Payer: Self-pay

## 2019-01-05 VITALS — BP 138/55 | HR 71 | Temp 98.4°F | Resp 16

## 2019-01-05 DIAGNOSIS — C787 Secondary malignant neoplasm of liver and intrahepatic bile duct: Secondary | ICD-10-CM | POA: Diagnosis not present

## 2019-01-05 DIAGNOSIS — Z5111 Encounter for antineoplastic chemotherapy: Secondary | ICD-10-CM | POA: Diagnosis not present

## 2019-01-05 DIAGNOSIS — Z452 Encounter for adjustment and management of vascular access device: Secondary | ICD-10-CM | POA: Insufficient documentation

## 2019-01-05 DIAGNOSIS — C55 Malignant neoplasm of uterus, part unspecified: Secondary | ICD-10-CM

## 2019-01-05 DIAGNOSIS — D6481 Anemia due to antineoplastic chemotherapy: Secondary | ICD-10-CM | POA: Diagnosis not present

## 2019-01-05 DIAGNOSIS — C541 Malignant neoplasm of endometrium: Secondary | ICD-10-CM | POA: Diagnosis present

## 2019-01-05 DIAGNOSIS — Z853 Personal history of malignant neoplasm of breast: Secondary | ICD-10-CM | POA: Diagnosis not present

## 2019-01-05 DIAGNOSIS — T451X5A Adverse effect of antineoplastic and immunosuppressive drugs, initial encounter: Secondary | ICD-10-CM

## 2019-01-05 DIAGNOSIS — G620 Drug-induced polyneuropathy: Secondary | ICD-10-CM | POA: Insufficient documentation

## 2019-01-05 LAB — CBC WITH DIFFERENTIAL/PLATELET
Abs Immature Granulocytes: 0.02 10*3/uL (ref 0.00–0.07)
Basophils Absolute: 0 10*3/uL (ref 0.0–0.1)
Basophils Relative: 0 %
Eosinophils Absolute: 0.1 10*3/uL (ref 0.0–0.5)
Eosinophils Relative: 1 %
HCT: 26 % — ABNORMAL LOW (ref 36.0–46.0)
Hemoglobin: 8.7 g/dL — ABNORMAL LOW (ref 12.0–15.0)
Immature Granulocytes: 0 %
Lymphocytes Relative: 18 %
Lymphs Abs: 0.8 10*3/uL (ref 0.7–4.0)
MCH: 33.2 pg (ref 26.0–34.0)
MCHC: 33.5 g/dL (ref 30.0–36.0)
MCV: 99.2 fL (ref 80.0–100.0)
Monocytes Absolute: 0.6 10*3/uL (ref 0.1–1.0)
Monocytes Relative: 12 %
Neutro Abs: 3.2 10*3/uL (ref 1.7–7.7)
Neutrophils Relative %: 69 %
Platelets: 149 10*3/uL — ABNORMAL LOW (ref 150–400)
RBC: 2.62 MIL/uL — ABNORMAL LOW (ref 3.87–5.11)
RDW: 17.1 % — ABNORMAL HIGH (ref 11.5–15.5)
WBC: 4.6 10*3/uL (ref 4.0–10.5)
nRBC: 0 % (ref 0.0–0.2)

## 2019-01-05 LAB — COMPREHENSIVE METABOLIC PANEL
ALT: 10 U/L (ref 0–44)
AST: 17 U/L (ref 15–41)
Albumin: 3.4 g/dL — ABNORMAL LOW (ref 3.5–5.0)
Alkaline Phosphatase: 60 U/L (ref 38–126)
Anion gap: 9 (ref 5–15)
BUN: 21 mg/dL (ref 8–23)
CO2: 26 mmol/L (ref 22–32)
Calcium: 8.8 mg/dL — ABNORMAL LOW (ref 8.9–10.3)
Chloride: 104 mmol/L (ref 98–111)
Creatinine, Ser: 0.87 mg/dL (ref 0.44–1.00)
GFR calc Af Amer: >60 mL/min (ref >60)
GFR calc non Af Amer: >60 mL/min (ref >60)
Glucose, Bld: 93 mg/dL (ref 70–99)
Potassium: 3.3 mmol/L — ABNORMAL LOW (ref 3.5–5.1)
Sodium: 139 mmol/L (ref 135–145)
Total Bilirubin: 0.3 mg/dL (ref 0.3–1.2)
Total Protein: 7.4 g/dL (ref 6.5–8.1)

## 2019-01-05 LAB — SAMPLE TO BLOOD BANK

## 2019-01-05 MED ORDER — HEPARIN SOD (PORK) LOCK FLUSH 100 UNIT/ML IV SOLN
500.0000 [IU] | Freq: Once | INTRAVENOUS | Status: AC
  Administered 2019-01-05: 500 [IU]
  Filled 2019-01-05: qty 5

## 2019-01-05 MED ORDER — SODIUM CHLORIDE 0.9% FLUSH
10.0000 mL | Freq: Once | INTRAVENOUS | Status: AC
  Administered 2019-01-05: 10 mL
  Filled 2019-01-05: qty 10

## 2019-01-05 NOTE — Patient Instructions (Signed)

## 2019-01-11 ENCOUNTER — Encounter: Payer: Medicare Other | Admitting: Adult Health

## 2019-01-12 ENCOUNTER — Encounter: Payer: Self-pay | Admitting: Hematology and Oncology

## 2019-01-12 ENCOUNTER — Inpatient Hospital Stay: Payer: Medicare Other

## 2019-01-12 ENCOUNTER — Other Ambulatory Visit: Payer: Self-pay | Admitting: Hematology and Oncology

## 2019-01-12 ENCOUNTER — Inpatient Hospital Stay (HOSPITAL_BASED_OUTPATIENT_CLINIC_OR_DEPARTMENT_OTHER): Payer: Medicare Other | Admitting: Hematology and Oncology

## 2019-01-12 ENCOUNTER — Other Ambulatory Visit: Payer: Self-pay

## 2019-01-12 DIAGNOSIS — G620 Drug-induced polyneuropathy: Secondary | ICD-10-CM

## 2019-01-12 DIAGNOSIS — C55 Malignant neoplasm of uterus, part unspecified: Secondary | ICD-10-CM | POA: Diagnosis not present

## 2019-01-12 DIAGNOSIS — Z452 Encounter for adjustment and management of vascular access device: Secondary | ICD-10-CM | POA: Diagnosis not present

## 2019-01-12 DIAGNOSIS — T451X5A Adverse effect of antineoplastic and immunosuppressive drugs, initial encounter: Secondary | ICD-10-CM | POA: Diagnosis not present

## 2019-01-12 DIAGNOSIS — D6481 Anemia due to antineoplastic chemotherapy: Secondary | ICD-10-CM

## 2019-01-12 DIAGNOSIS — Z853 Personal history of malignant neoplasm of breast: Secondary | ICD-10-CM | POA: Diagnosis not present

## 2019-01-12 DIAGNOSIS — Z5111 Encounter for antineoplastic chemotherapy: Secondary | ICD-10-CM | POA: Diagnosis not present

## 2019-01-12 DIAGNOSIS — C787 Secondary malignant neoplasm of liver and intrahepatic bile duct: Secondary | ICD-10-CM | POA: Diagnosis not present

## 2019-01-12 LAB — COMPREHENSIVE METABOLIC PANEL
ALT: 11 U/L (ref 0–44)
AST: 16 U/L (ref 15–41)
Albumin: 3.4 g/dL — ABNORMAL LOW (ref 3.5–5.0)
Alkaline Phosphatase: 68 U/L (ref 38–126)
Anion gap: 12 (ref 5–15)
BUN: 24 mg/dL — ABNORMAL HIGH (ref 8–23)
CO2: 23 mmol/L (ref 22–32)
Calcium: 9.6 mg/dL (ref 8.9–10.3)
Chloride: 104 mmol/L (ref 98–111)
Creatinine, Ser: 1.06 mg/dL — ABNORMAL HIGH (ref 0.44–1.00)
GFR calc Af Amer: 58 mL/min — ABNORMAL LOW (ref >60)
GFR calc non Af Amer: 50 mL/min — ABNORMAL LOW (ref >60)
Glucose, Bld: 139 mg/dL — ABNORMAL HIGH (ref 70–99)
Potassium: 3.7 mmol/L (ref 3.5–5.1)
Sodium: 139 mmol/L (ref 135–145)
Total Bilirubin: 0.3 mg/dL (ref 0.3–1.2)
Total Protein: 7.8 g/dL (ref 6.5–8.1)

## 2019-01-12 LAB — SAMPLE TO BLOOD BANK

## 2019-01-12 LAB — CBC WITH DIFFERENTIAL/PLATELET
Abs Immature Granulocytes: 0.08 10*3/uL — ABNORMAL HIGH (ref 0.00–0.07)
Basophils Absolute: 0 10*3/uL (ref 0.0–0.1)
Basophils Relative: 0 %
Eosinophils Absolute: 0 10*3/uL (ref 0.0–0.5)
Eosinophils Relative: 0 %
HCT: 27.6 % — ABNORMAL LOW (ref 36.0–46.0)
Hemoglobin: 9.2 g/dL — ABNORMAL LOW (ref 12.0–15.0)
Immature Granulocytes: 1 %
Lymphocytes Relative: 6 %
Lymphs Abs: 0.6 10*3/uL — ABNORMAL LOW (ref 0.7–4.0)
MCH: 33 pg (ref 26.0–34.0)
MCHC: 33.3 g/dL (ref 30.0–36.0)
MCV: 98.9 fL (ref 80.0–100.0)
Monocytes Absolute: 0.4 10*3/uL (ref 0.1–1.0)
Monocytes Relative: 4 %
Neutro Abs: 9.4 10*3/uL — ABNORMAL HIGH (ref 1.7–7.7)
Neutrophils Relative %: 89 %
Platelets: 152 10*3/uL (ref 150–400)
RBC: 2.79 MIL/uL — ABNORMAL LOW (ref 3.87–5.11)
RDW: 16.5 % — ABNORMAL HIGH (ref 11.5–15.5)
WBC: 10.5 10*3/uL (ref 4.0–10.5)
nRBC: 0 % (ref 0.0–0.2)

## 2019-01-12 MED ORDER — DIPHENHYDRAMINE HCL 50 MG/ML IJ SOLN
50.0000 mg | Freq: Once | INTRAMUSCULAR | Status: AC
  Administered 2019-01-12: 50 mg via INTRAVENOUS

## 2019-01-12 MED ORDER — FAMOTIDINE IN NACL 20-0.9 MG/50ML-% IV SOLN
INTRAVENOUS | Status: AC
  Filled 2019-01-12: qty 50

## 2019-01-12 MED ORDER — SODIUM CHLORIDE 0.9% FLUSH
10.0000 mL | Freq: Once | INTRAVENOUS | Status: AC
  Administered 2019-01-12: 10 mL
  Filled 2019-01-12: qty 10

## 2019-01-12 MED ORDER — PALONOSETRON HCL INJECTION 0.25 MG/5ML
0.2500 mg | Freq: Once | INTRAVENOUS | Status: AC
  Administered 2019-01-12: 0.25 mg via INTRAVENOUS

## 2019-01-12 MED ORDER — SODIUM CHLORIDE 0.9 % IV SOLN
Freq: Once | INTRAVENOUS | Status: AC
  Administered 2019-01-12: 11:00:00 via INTRAVENOUS
  Filled 2019-01-12: qty 250

## 2019-01-12 MED ORDER — FAMOTIDINE IN NACL 20-0.9 MG/50ML-% IV SOLN
20.0000 mg | Freq: Once | INTRAVENOUS | Status: AC
  Administered 2019-01-12: 20 mg via INTRAVENOUS

## 2019-01-12 MED ORDER — SODIUM CHLORIDE 0.9 % IV SOLN
107.0000 mg/m2 | Freq: Once | INTRAVENOUS | Status: AC
  Administered 2019-01-12: 252 mg via INTRAVENOUS
  Filled 2019-01-12: qty 42

## 2019-01-12 MED ORDER — HEPARIN SOD (PORK) LOCK FLUSH 100 UNIT/ML IV SOLN
500.0000 [IU] | Freq: Once | INTRAVENOUS | Status: AC | PRN
  Administered 2019-01-12: 500 [IU]
  Filled 2019-01-12: qty 5

## 2019-01-12 MED ORDER — SODIUM CHLORIDE 0.9% FLUSH
10.0000 mL | INTRAVENOUS | Status: DC | PRN
  Administered 2019-01-12: 10 mL
  Filled 2019-01-12: qty 10

## 2019-01-12 MED ORDER — PALONOSETRON HCL INJECTION 0.25 MG/5ML
INTRAVENOUS | Status: AC
  Filled 2019-01-12: qty 5

## 2019-01-12 MED ORDER — DEXAMETHASONE 4 MG PO TABS: ORAL_TABLET | ORAL | 1 refills | Status: DC

## 2019-01-12 MED ORDER — SODIUM CHLORIDE 0.9 % IV SOLN
330.0000 mg | Freq: Once | INTRAVENOUS | Status: AC
  Administered 2019-01-12: 330 mg via INTRAVENOUS
  Filled 2019-01-12: qty 33

## 2019-01-12 MED ORDER — SODIUM CHLORIDE 0.9 % IV SOLN
Freq: Once | INTRAVENOUS | Status: AC
  Administered 2019-01-12: 12:00:00 via INTRAVENOUS
  Filled 2019-01-12: qty 5

## 2019-01-12 MED ORDER — DIPHENHYDRAMINE HCL 50 MG/ML IJ SOLN
INTRAMUSCULAR | Status: AC
  Filled 2019-01-12: qty 1

## 2019-01-12 NOTE — Patient Instructions (Signed)
   [facility] Discharge Instructions for Patients Receiving Chemotherapy  Today you received the following chemotherapy agents Taxol and Carboplatin   To help prevent nausea and vomiting after your treatment, we encourage you to take your nausea medication as directed.    If you develop nausea and vomiting that is not controlled by your nausea medication, call the clinic.   BELOW ARE SYMPTOMS THAT SHOULD BE REPORTED IMMEDIATELY:  *FEVER GREATER THAN 100.5 F  *CHILLS WITH OR WITHOUT FEVER  NAUSEA AND VOMITING THAT IS NOT CONTROLLED WITH YOUR NAUSEA MEDICATION  *UNUSUAL SHORTNESS OF BREATH  *UNUSUAL BRUISING OR BLEEDING  TENDERNESS IN MOUTH AND THROAT WITH OR WITHOUT PRESENCE OF ULCERS  *URINARY PROBLEMS  *BOWEL PROBLEMS  UNUSUAL RASH Items with * indicate a potential emergency and should be followed up as soon as possible.  Feel free to call the clinic should you have any questions or concerns. The clinic phone number is [phone].  Please show the CHEMO ALERT CARD at check-in to the Emergency Department and triage nurse.   

## 2019-01-12 NOTE — Assessment & Plan Note (Signed)
She has received blood transfusion recently She denies bleeding Her chemotherapy dose is adjusted according to her renal function and her current weight We will observe only for now

## 2019-01-12 NOTE — Assessment & Plan Note (Signed)
She tolerated recent treatment well except for anemia requiring transfusion With dose adjustment, I am hopeful she would not need blood in the near future She is not symptomatic when she received blood transfusion We will proceed with treatment as scheduled

## 2019-01-12 NOTE — Assessment & Plan Note (Signed)
she has mild peripheral neuropathy, likely 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 of treatment. It is only mild, not bothering the patient. I will observe for now If it gets worse in the future, I will consider modifying the dose of the treatment  

## 2019-01-12 NOTE — Progress Notes (Signed)
[place] OFFICE PROGRESS NOTE  Patient Care Team: [name], DO as PCP - General (Family Medicine) [name], MD as Consulting Physician (Ophthalmology) [name], DPM as Consulting Physician (Podiatry) [name], [name], MD as Referring Physician (Optometry) [name], MD as Consulting Physician (Orthopedic Surgery) [name], RN as Oncology Nurse Navigator (Oncology)  ASSESSMENT & PLAN:  Uterine cancer [facility]) She tolerated recent treatment well except for anemia requiring transfusion With dose adjustment, I am hopeful she would not need blood in the near future She is not symptomatic when she received blood transfusion We will proceed with treatment as scheduled  Anemia due to antineoplastic chemotherapy She has received blood transfusion recently She denies bleeding Her chemotherapy dose is adjusted according to her renal function and her current weight We will observe only for now  Peripheral neuropathy due to chemotherapy [facility]) she has mild peripheral neuropathy, likely 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 of treatment. It is only mild, not bothering the patient. I will observe for now If it gets worse in the future, I will consider modifying the dose of the treatment    No orders of the defined types were placed in this encounter.   INTERVAL HISTORY: Please see below for problem oriented charting. 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 Ultrasound irregular hypoechoic mass 7 x 6 x 5 mm   01/06/2012  Initial Biopsy   Initial biopsy was benign . 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 Cervix, biopsy - HIGH GRADE CARCINOMA. - SEE COMMENT. 2. Endocervix, curettage - HIGH GRADE CARCINOMA. - SEE COMMENT. 3. Endometrium, biopsy - HIGH GRADE CARCINOMA. - SEE COMMENT. Microscopic Comment 1. - 3. The carcinoma in the three specimens is morphologically similar and has features suggestive of papillary serous carcinoma.   07/26/2018 Tumor Marker   Patient's tumor was tested for the following markers: CA-125 Results of the tumor marker test revealed 835   07/28/2018 Imaging   1. Several (at least 10) solid pulmonary nodules scattered throughout both lungs are new since 2012 chest CT, likely representing pulmonary metastases, largest 6 mm, below PET resolution. 2. Redemonstration of hypodense 2.5 cm segment 7 right liver lobe mass, indeterminate, suspicious for liver metastasis. 3. 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 4. Three-vessel coronary atherosclerosis.  Aortic Atherosclerosis (ICD10-I70.0).  08/03/2018 Cancer Staging   Staging form: Corpus Uteri - Carcinoma and Carcinosarcoma, AJCC 8th Edition - Clinical: FIGO Stage IVB (cT3b, cN2, cM1) - Signed by [name], MD on 08/03/2018   08/05/2018 Tumor Marker   Patient's tumor was tested for the following markers: CA-125 Results of the tumor marker test revealed 901   08/06/2018 Procedure   Placement of single lumen port a cath via right internal jugular vein. The catheter tip lies at the cavo-atrial junction. A power injectable port a cath was placed and is ready for immediate use.    08/11/2018 -  Chemotherapy   The patient had carboplatin and taxol   09/01/2018 Tumor Marker   Patient's tumor was tested for the following markers: CA-125 Results of the tumor marker test revealed 406   09/09/2018 Genetic Testing   The Common Hereditary Cancers Panel offered by [facility] includes sequencing and/or deletion duplication testing of the following 48 genes: APC, ATM, AXIN2, BARD1, BMPR1A, BRCA1, BRCA2, BRIP1, CDH1, CDKN2A (p14ARF), CDKN2A (p16INK4a), CKD4, CHEK2, CTNNA1, DICER1, EPCAM (Deletion/duplication testing only), GREM1 (promoter region deletion/duplication testing only), KIT, MEN1, MLH1, MSH2, MSH3, MSH6, MUTYH, NBN, NF1, NHTL1, PALB2, PDGFRA, PMS2, POLD1, POLE, PTEN, RAD50, RAD51C, RAD51D, RNF43, SDHB, SDHC, SDHD, SMAD4, SMARCA4. STK11, TP53, TSC1, TSC2, and VHL.  The following genes were evaluated for sequence changes only: SDHA and HOXB13 c.251G>A variant only.  Results: Negative, no pathogenic variants identified. The report date is 09/09/2018.     09/22/2018 Tumor Marker   Patient's tumor was tested for the following markers: CA-125 Results of the tumor marker test revealed 110   10/12/2018 Tumor Marker   Patient's tumor was tested for the following markers: CA-125 Results of the tumor marker test revealed 55.3   10/12/2018 Imaging   1. Mild decrease in pulmonary metastases, liver metastases, and metastatic abdominal and pelvic lymphadenopathy. 2. Decreased size of complex cystic lesion in left presacral region. 3. No new or progressive metastatic disease identified. 4. Stable small right inguinal  hernia containing small portion of urinary bladder.   11/03/2018 Tumor Marker   Patient's tumor was tested for the following markers: CA-125 Results of the tumor marker test revealed 34.3    Genetic Testing   Patient has genetic testing done for HER2. Results revealed patient has the following: HER2 - negative from pathology from 07/26/18.   12/20/2018 Imaging   1. Possible new omental nodule. 2. Improving abdominal and pelvic retroperitoneal adenopathy. 3. Stable pulmonary and hepatic metastatic disease. 4. Left perirectal/presacral fluid collection has decreased in size. 5. Right inguinal hernia contains a small portion of the bladder, as before. 6. Aortic atherosclerosis (ICD10-170.0). Coronary artery calcification   12/21/2018 Tumor Marker   Patient's tumor was tested for the following markers: CA-125 Results of the tumor marker test revealed 15.6     REVIEW OF SYSTEMS:   Constitutional: Denies fevers, chills or abnormal weight loss Eyes: Denies blurriness of vision Ears, nose, mouth, throat, and face: Denies mucositis or sore throat Respiratory: Denies cough, dyspnea or wheezes Gastrointestinal:  Denies nausea, heartburn or change in bowel habits Skin: Denies abnormal skin rashes Lymphatics: Denies new lymphadenopathy or easy bruising Neurological:Denies numbness, tingling or new weaknesses Behavioral/Psych: Mood is stable, no new changes  All other systems were reviewed with the patient and are negative.  I have reviewed the past medical history, past surgical history, social history and family history with the patient and they are unchanged from previous note.  ALLERGIES:  has No Known Allergies.  MEDICATIONS:  Current Outpatient Medications  Medication Sig Dispense Refill  . acetaminophen (TYLENOL) 500 MG tablet Take 1 tablet (500 mg total) by mouth every 6 (six) hours as needed. (Patient taking differently: Take 500 mg by mouth every 6 (six) hours as needed for  moderate pain. ) 30 tablet 3  . ADVAIR DISKUS 100-50 MCG/DOSE AEPB USE 1 INHALATION BY MOUTH TWICE DAILY 60 each 0  . Calcium Carbonate (CALTRATE 600 PO) Take 1 tablet by mouth 2 (two) times daily.     Marland Kitchen dexamethasone (DECADRON) 4 MG tablet Take 2 tabs the night before and 2 tabs on the morning of chemotherapy every 3 weeks with food 12 tablet 1  . enalapril (VASOTEC) 20 MG tablet TAKE 1 TABLET(20 MG) BY MOUTH TWICE DAILY 180 tablet 2  . furosemide (LASIX) 40 MG tablet TAKE 1 TABLET BY MOUTH DAILY AS NEEDED FOR LEG SWELLING 90 tablet 2  . hydrochlorothiazide (HYDRODIURIL) 25 MG tablet TAKE 1 TABLET(25 MG) BY MOUTH DAILY 90 tablet 1  . lidocaine-prilocaine (EMLA) cream Apply to affected area once 30 g 3  . Multiple Vitamin (MULTIVITAMIN) tablet Take 1 tablet by mouth daily.    . Multiple Vitamins-Minerals (ICAPS AREDS 2 PO) Take 1 capsule by mouth 2 (two) times daily.    . Multiple Vitamins-Minerals (WOMENS BONE HEALTH PO) Take 1 tablet by mouth daily.    Marland Kitchen omeprazole (PRILOSEC) 40 MG capsule Take 1 capsule (40 mg total) by mouth daily. 30 capsule 5  . ondansetron (ZOFRAN) 8 MG tablet Take 1 tablet (8 mg total) by mouth every 8 (eight) hours as needed. Start on the third day after chemotherapy. 30 tablet 1  . prochlorperazine (COMPAZINE) 10 MG tablet Take 1 tablet (10 mg total) by mouth every 6 (six) hours as needed (Nausea or vomiting). 30 tablet 1  . senna (SENOKOT) 8.6 MG TABS tablet Take 1 tablet (8.6 mg total) by mouth daily. 120 each 0  . Simethicone 80 MG TABS Take 1 tablet (80 mg total) by mouth QID. 120 tablet 2  . Vitamin D, Cholecalciferol, 1000 units CAPS Take by mouth daily.    Derrill Memo ON 01/14/2019] XARELTO 20 MG TABS tablet TAKE 1 TABLET BY MOUTH DAILY 90 tablet 0   No current facility-administered medications for this visit.     PHYSICAL EXAMINATION: ECOG PERFORMANCE STATUS: 1 - Symptomatic but completely ambulatory  Vitals:   01/12/19 1020  BP: (!) 147/65  Pulse: 86   Resp: 18  Temp: 98.9 F (37.2 C)  SpO2: 100%   Filed Weights   01/12/19 1020  Weight: 256 lb 6.4 oz (116.3 kg)    GENERAL:alert, no distress and comfortable.  Limited exam due to morbid obesity SKIN: skin color, texture, turgor are normal, no rashes or significant lesions EYES: normal, Conjunctiva are pink and non-injected, sclera clear OROPHARYNX:no exudate, no erythema and lips, buccal mucosa, and tongue normal  NECK: supple, thyroid normal size, non-tender, without nodularity LYMPH:  no palpable lymphadenopathy in the cervical, axillary or inguinal LUNGS: clear to auscultation and percussion with normal breathing effort HEART: regular rate & rhythm and no murmurs with moderate bilateral lower extremity edema ABDOMEN:abdomen soft, non-tender and normal bowel sounds Musculoskeletal:no cyanosis of digits and no clubbing  NEURO: alert & oriented x 3 with fluent speech, no focal motor/sensory deficits  LABORATORY DATA:  I have reviewed the data as listed    Component Value Date/Time   NA 139 01/05/2019 1040   NA 142 12/25/2014 1122   K 3.3 (  L) 01/05/2019 1040   K 4.1 12/25/2014 1122   CL 104 01/05/2019 1040   CL 102 10/04/2012 1159   CO2 26 01/05/2019 1040   CO2 29 12/25/2014 1122   GLUCOSE 93 01/05/2019 1040   GLUCOSE 99 12/25/2014 1122   GLUCOSE 116 (H) 10/04/2012 1159   BUN 21 01/05/2019 1040   BUN 13.7 12/25/2014 1122   CREATININE 0.87 01/05/2019 1040   CREATININE 1.08 (H) 12/02/2018 1432   CREATININE 0.96 (H) 02/27/2016 1530   CREATININE 0.9 12/25/2014 1122   CALCIUM 8.8 (L) 01/05/2019 1040   CALCIUM 10.1 12/25/2014 1122   PROT 7.4 01/05/2019 1040   PROT 7.8 12/25/2014 1122   ALBUMIN 3.4 (L) 01/05/2019 1040   ALBUMIN 3.4 (L) 12/25/2014 1122   AST 17 01/05/2019 1040   AST 22 12/02/2018 1432   AST 21 12/25/2014 1122   ALT 10 01/05/2019 1040   ALT 13 12/02/2018 1432   ALT 17 12/25/2014 1122   ALKPHOS 60 01/05/2019 1040   ALKPHOS 99 12/25/2014 1122   BILITOT  0.3 01/05/2019 1040   BILITOT <0.3 (L) 12/02/2018 1432   BILITOT 0.41 12/25/2014 1122   GFRNONAA >60 01/05/2019 1040   GFRNONAA 49 (L) 12/02/2018 1432   GFRNONAA 58 (L) 02/27/2016 1530   GFRAA >60 01/05/2019 1040   GFRAA 57 (L) 12/02/2018 1432   GFRAA 66 02/27/2016 1530    No results found for: SPEP, UPEP  Lab Results  Component Value Date   WBC 10.5 01/12/2019   NEUTROABS 9.4 (H) 01/12/2019   HGB 9.2 (L) 01/12/2019   HCT 27.6 (L) 01/12/2019   MCV 98.9 01/12/2019   PLT 152 01/12/2019      Chemistry      Component Value Date/Time   NA 139 01/05/2019 1040   NA 142 12/25/2014 1122   K 3.3 (L) 01/05/2019 1040   K 4.1 12/25/2014 1122   CL 104 01/05/2019 1040   CL 102 10/04/2012 1159   CO2 26 01/05/2019 1040   CO2 29 12/25/2014 1122   BUN 21 01/05/2019 1040   BUN 13.7 12/25/2014 1122   CREATININE 0.87 01/05/2019 1040   CREATININE 1.08 (H) 12/02/2018 1432   CREATININE 0.96 (H) 02/27/2016 1530   CREATININE 0.9 12/25/2014 1122      Component Value Date/Time   CALCIUM 8.8 (L) 01/05/2019 1040   CALCIUM 10.1 12/25/2014 1122   ALKPHOS 60 01/05/2019 1040   ALKPHOS 99 12/25/2014 1122   AST 17 01/05/2019 1040   AST 22 12/02/2018 1432   AST 21 12/25/2014 1122   ALT 10 01/05/2019 1040   ALT 13 12/02/2018 1432   ALT 17 12/25/2014 1122   BILITOT 0.3 01/05/2019 1040   BILITOT <0.3 (L) 12/02/2018 1432   BILITOT 0.41 12/25/2014 1122       RADIOGRAPHIC STUDIES: I have personally reviewed the radiological images as listed and agreed with the findings in the report. Ct Chest W Contrast  Result Date: 12/20/2018 CLINICAL DATA:  Endometrial cancer, ongoing chemotherapy. History of left breast cancer. Weight loss. EXAM: CT CHEST, ABDOMEN, AND PELVIS WITH CONTRAST TECHNIQUE: Multidetector CT imaging of the chest, abdomen and pelvis was performed following the standard protocol during bolus administration of intravenous contrast. CONTRAST:  172m OMNIPAQUE IOHEXOL 300 MG/ML  SOLN  COMPARISON:  10/12/2018. FINDINGS: CT CHEST FINDINGS Cardiovascular: Right IJ Port-A-Cath terminates in the high right atrium. Atherosclerotic calcification of the aorta and coronary arteries. Heart is mildly enlarged. No pericardial effusion. Mediastinum/Nodes: No pathologically enlarged mediastinal, hilar or  axillary lymph nodes. Esophagus is grossly unremarkable. Lungs/Pleura: Upper lobe peribronchovascular nodularity is similar. A few scattered discrete pulmonary nodules measure 4 mm or less in size, as before. Mild scarring in the lower lobes. No pleural fluid. Airway is unremarkable. Musculoskeletal: No worrisome lytic or sclerotic lesions. CT ABDOMEN PELVIS FINDINGS Hepatobiliary: A somewhat ill-defined lesion in the dome of the liver measures 1.1 cm (series 2, image 43), stable. Additional low-attenuation lesions in the liver are also stable. Liver and gallbladder are otherwise unremarkable. No biliary ductal dilatation. Pancreas: Negative. Spleen: Negative. Adrenals/Urinary Tract: Adrenal glands and right kidney are unremarkable. 2.6 cm fluid density lesion in the upper pole left kidney is likely a cyst. Subcentimeter low-attenuation lesion in the lower pole left kidney is too small to characterize. Ureters are decompressed. Bladder is low in volume. The right anterolateral aspect of the bladder extends into a right inguinal hernia. Stomach/Bowel: Stomach, small bowel, appendix and colon are unremarkable. Vascular/Lymphatic: Atherosclerotic calcification of the aorta without aneurysm. Abdominal retroperitoneal lymph nodes have decreased in size in the interval. Index left periaortic lymph node (series 2, image 68) measures 9 mm, compared to 1.4 cm previously. Right common iliac lymph node has decreased slightly in size, measuring 9 mm (84), compared to 1.4 cm on 10/12/2018. Right external iliac lymph node measures 5 mm (95), previously 1.4 cm. Left chain lymph nodes measure up to 11 mm along the left  external iliac vessels, stable. Inguinal lymph nodes have fatty hila and measure up to 1.8 cm on the left, also similar. Reproductive: Uterus is visualized.  No adnexal mass. Other: Possible omental nodule in the midline ventral abdomen, measuring 7 mm (series 2, image 76). Right inguinal hernia contains the right anterolateral aspect of the bladder, as before. Small amount of fluid in the left perirectal fat measures 1.4 x 3.0 cm, decreased from 3.2 x 5.3 cm on 10/12/2018. Musculoskeletal: No worrisome lytic or sclerotic lesions. IMPRESSION: 1. Possible new omental nodule. 2. Improving abdominal and pelvic retroperitoneal adenopathy. 3. Stable pulmonary and hepatic metastatic disease. 4. Left perirectal/presacral fluid collection has decreased in size. 5. Right inguinal hernia contains a small portion of the bladder, as before. 6. Aortic atherosclerosis (ICD10-170.0). Coronary artery calcification. Electronically Signed   By: Lorin Picket M.D.   On: 12/20/2018 16:07   Ct Abdomen Pelvis W Contrast  Result Date: 12/20/2018 CLINICAL DATA:  Endometrial cancer, ongoing chemotherapy. History of left breast cancer. Weight loss. EXAM: CT CHEST, ABDOMEN, AND PELVIS WITH CONTRAST TECHNIQUE: Multidetector CT imaging of the chest, abdomen and pelvis was performed following the standard protocol during bolus administration of intravenous contrast. CONTRAST:  11m OMNIPAQUE IOHEXOL 300 MG/ML  SOLN COMPARISON:  10/12/2018. FINDINGS: CT CHEST FINDINGS Cardiovascular: Right IJ Port-A-Cath terminates in the high right atrium. Atherosclerotic calcification of the aorta and coronary arteries. Heart is mildly enlarged. No pericardial effusion. Mediastinum/Nodes: No pathologically enlarged mediastinal, hilar or axillary lymph nodes. Esophagus is grossly unremarkable. Lungs/Pleura: Upper lobe peribronchovascular nodularity is similar. A few scattered discrete pulmonary nodules measure 4 mm or less in size, as before. Mild  scarring in the lower lobes. No pleural fluid. Airway is unremarkable. Musculoskeletal: No worrisome lytic or sclerotic lesions. CT ABDOMEN PELVIS FINDINGS Hepatobiliary: A somewhat ill-defined lesion in the dome of the liver measures 1.1 cm (series 2, image 43), stable. Additional low-attenuation lesions in the liver are also stable. Liver and gallbladder are otherwise unremarkable. No biliary ductal dilatation. Pancreas: Negative. Spleen: Negative. Adrenals/Urinary Tract: Adrenal glands and right kidney are unremarkable.  2.6 cm fluid density lesion in the upper pole left kidney is likely a cyst. Subcentimeter low-attenuation lesion in the lower pole left kidney is too small to characterize. Ureters are decompressed. Bladder is low in volume. The right anterolateral aspect of the bladder extends into a right inguinal hernia. Stomach/Bowel: Stomach, small bowel, appendix and colon are unremarkable. Vascular/Lymphatic: Atherosclerotic calcification of the aorta without aneurysm. Abdominal retroperitoneal lymph nodes have decreased in size in the interval. Index left periaortic lymph node (series 2, image 68) measures 9 mm, compared to 1.4 cm previously. Right common iliac lymph node has decreased slightly in size, measuring 9 mm (84), compared to 1.4 cm on 10/12/2018. Right external iliac lymph node measures 5 mm (95), previously 1.4 cm. Left chain lymph nodes measure up to 11 mm along the left external iliac vessels, stable. Inguinal lymph nodes have fatty hila and measure up to 1.8 cm on the left, also similar. Reproductive: Uterus is visualized.  No adnexal mass. Other: Possible omental nodule in the midline ventral abdomen, measuring 7 mm (series 2, image 76). Right inguinal hernia contains the right anterolateral aspect of the bladder, as before. Small amount of fluid in the left perirectal fat measures 1.4 x 3.0 cm, decreased from 3.2 x 5.3 cm on 10/12/2018. Musculoskeletal: No worrisome lytic or sclerotic  lesions. IMPRESSION: 1. Possible new omental nodule. 2. Improving abdominal and pelvic retroperitoneal adenopathy. 3. Stable pulmonary and hepatic metastatic disease. 4. Left perirectal/presacral fluid collection has decreased in size. 5. Right inguinal hernia contains a small portion of the bladder, as before. 6. Aortic atherosclerosis (ICD10-170.0). Coronary artery calcification. Electronically Signed   By: Lorin Picket M.D.   On: 12/20/2018 16:07    All questions were answered. The patient knows to call the clinic with any problems, questions or concerns. No barriers to learning was detected.  I spent 15 minutes counseling the patient face to face. The total time spent in the appointment was 20 minutes and more than 50% was on counseling and review of test results  [name], MD 01/12/2019 10:30 AM

## 2019-01-13 LAB — CA 125: Cancer Antigen (CA) 125: 13.4 U/mL (ref 0.0–38.1)

## 2019-01-14 ENCOUNTER — Encounter (HOSPITAL_COMMUNITY): Payer: Self-pay | Admitting: Emergency Medicine

## 2019-01-14 ENCOUNTER — Other Ambulatory Visit: Payer: Self-pay

## 2019-01-14 ENCOUNTER — Emergency Department (HOSPITAL_COMMUNITY): Payer: Medicare Other

## 2019-01-14 ENCOUNTER — Emergency Department (HOSPITAL_COMMUNITY)
Admission: EM | Admit: 2019-01-14 | Discharge: 2019-01-15 | Disposition: A | Discharge status: Home / Self Care | Payer: Medicare Other | Attending: Emergency Medicine | Admitting: Emergency Medicine

## 2019-01-14 DIAGNOSIS — K449 Diaphragmatic hernia without obstruction or gangrene: Secondary | ICD-10-CM | POA: Diagnosis not present

## 2019-01-14 DIAGNOSIS — J45909 Unspecified asthma, uncomplicated: Secondary | ICD-10-CM | POA: Diagnosis not present

## 2019-01-14 DIAGNOSIS — Z9221 Personal history of antineoplastic chemotherapy: Secondary | ICD-10-CM | POA: Insufficient documentation

## 2019-01-14 DIAGNOSIS — R5383 Other fatigue: Secondary | ICD-10-CM | POA: Insufficient documentation

## 2019-01-14 DIAGNOSIS — Z87891 Personal history of nicotine dependence: Secondary | ICD-10-CM | POA: Insufficient documentation

## 2019-01-14 DIAGNOSIS — R1030 Lower abdominal pain, unspecified: Secondary | ICD-10-CM | POA: Diagnosis not present

## 2019-01-14 DIAGNOSIS — R531 Weakness: Secondary | ICD-10-CM | POA: Diagnosis not present

## 2019-01-14 DIAGNOSIS — I130 Hypertensive heart and chronic kidney disease with heart failure and stage 1 through stage 4 chronic kidney disease, or unspecified chronic kidney disease: Secondary | ICD-10-CM | POA: Insufficient documentation

## 2019-01-14 DIAGNOSIS — Z79899 Other long term (current) drug therapy: Secondary | ICD-10-CM | POA: Diagnosis not present

## 2019-01-14 DIAGNOSIS — K4090 Unilateral inguinal hernia, without obstruction or gangrene, not specified as recurrent: Secondary | ICD-10-CM

## 2019-01-14 DIAGNOSIS — Z853 Personal history of malignant neoplasm of breast: Secondary | ICD-10-CM | POA: Insufficient documentation

## 2019-01-14 DIAGNOSIS — N3289 Other specified disorders of bladder: Secondary | ICD-10-CM | POA: Diagnosis not present

## 2019-01-14 DIAGNOSIS — K828 Other specified diseases of gallbladder: Secondary | ICD-10-CM | POA: Diagnosis not present

## 2019-01-14 DIAGNOSIS — I5030 Unspecified diastolic (congestive) heart failure: Secondary | ICD-10-CM | POA: Insufficient documentation

## 2019-01-14 DIAGNOSIS — N281 Cyst of kidney, acquired: Secondary | ICD-10-CM | POA: Diagnosis not present

## 2019-01-14 DIAGNOSIS — N183 Chronic kidney disease, stage 3 (moderate): Secondary | ICD-10-CM | POA: Diagnosis not present

## 2019-01-14 LAB — CBC WITH DIFFERENTIAL/PLATELET
Abs Immature Granulocytes: 0.03 10*3/uL (ref 0.00–0.07)
Basophils Absolute: 0 10*3/uL (ref 0.0–0.1)
Basophils Relative: 0 %
Eosinophils Absolute: 0 10*3/uL (ref 0.0–0.5)
Eosinophils Relative: 0 %
HCT: 27 % — ABNORMAL LOW (ref 36.0–46.0)
Hemoglobin: 8.8 g/dL — ABNORMAL LOW (ref 12.0–15.0)
Immature Granulocytes: 0 %
Lymphocytes Relative: 7 %
Lymphs Abs: 0.5 10*3/uL — ABNORMAL LOW (ref 0.7–4.0)
MCH: 33.2 pg (ref 26.0–34.0)
MCHC: 32.6 g/dL (ref 30.0–36.0)
MCV: 101.9 fL — ABNORMAL HIGH (ref 80.0–100.0)
Monocytes Absolute: 0.4 10*3/uL (ref 0.1–1.0)
Monocytes Relative: 4 %
Neutro Abs: 7 10*3/uL (ref 1.7–7.7)
Neutrophils Relative %: 89 %
Platelets: 131 10*3/uL — ABNORMAL LOW (ref 150–400)
RBC: 2.65 MIL/uL — ABNORMAL LOW (ref 3.87–5.11)
RDW: 16.9 % — ABNORMAL HIGH (ref 11.5–15.5)
WBC: 7.9 10*3/uL (ref 4.0–10.5)
nRBC: 0 % (ref 0.0–0.2)

## 2019-01-14 LAB — URINALYSIS, ROUTINE W REFLEX MICROSCOPIC
Bilirubin Urine: NEGATIVE
Glucose, UA: NEGATIVE mg/dL
Hgb urine dipstick: NEGATIVE
Ketones, ur: NEGATIVE mg/dL
Leukocytes,Ua: NEGATIVE
Nitrite: NEGATIVE
Protein, ur: NEGATIVE mg/dL
Specific Gravity, Urine: 1.014 (ref 1.005–1.030)
pH: 5 (ref 5.0–8.0)

## 2019-01-14 LAB — COMPREHENSIVE METABOLIC PANEL
ALT: 15 U/L (ref 0–44)
AST: 22 U/L (ref 15–41)
Albumin: 3.3 g/dL — ABNORMAL LOW (ref 3.5–5.0)
Alkaline Phosphatase: 60 U/L (ref 38–126)
Anion gap: 10 (ref 5–15)
BUN: 29 mg/dL — ABNORMAL HIGH (ref 8–23)
CO2: 26 mmol/L (ref 22–32)
Calcium: 9.2 mg/dL (ref 8.9–10.3)
Chloride: 103 mmol/L (ref 98–111)
Creatinine, Ser: 0.87 mg/dL (ref 0.44–1.00)
GFR calc Af Amer: >60 mL/min (ref >60)
GFR calc non Af Amer: >60 mL/min (ref >60)
Glucose, Bld: 133 mg/dL — ABNORMAL HIGH (ref 70–99)
Potassium: 3.6 mmol/L (ref 3.5–5.1)
Sodium: 139 mmol/L (ref 135–145)
Total Bilirubin: 0.3 mg/dL (ref 0.3–1.2)
Total Protein: 7.2 g/dL (ref 6.5–8.1)

## 2019-01-14 MED ORDER — SODIUM CHLORIDE (PF) 0.9 % IJ SOLN
INTRAMUSCULAR | Status: AC
  Filled 2019-01-14: qty 50

## 2019-01-14 MED ORDER — FENTANYL CITRATE (PF) 100 MCG/2ML IJ SOLN: 50.0000 ug | INTRAMUSCULAR | Status: DC | PRN

## 2019-01-14 MED ORDER — FENTANYL CITRATE (PF) 100 MCG/2ML IJ SOLN
100.0000 ug | Freq: Once | INTRAMUSCULAR | Status: AC
  Administered 2019-01-14: 100 ug via INTRAVENOUS
  Filled 2019-01-14: qty 2

## 2019-01-14 MED ORDER — IOHEXOL 300 MG/ML  SOLN
100.0000 mL | Freq: Once | INTRAMUSCULAR | Status: AC | PRN
  Administered 2019-01-14: 100 mL via INTRAVENOUS

## 2019-01-14 MED ORDER — ONDANSETRON HCL 4 MG/2ML IJ SOLN: 4.0000 mg | Freq: Four times a day (QID) | INTRAMUSCULAR | Status: DC | PRN

## 2019-01-14 NOTE — ED Notes (Signed)
Pt transported to CT ?

## 2019-01-14 NOTE — ED Notes (Signed)
Pt able to stand, walk, and sit with 1 person, minor assistance. Pt able to use railings on wall and on bed to support herself.

## 2019-01-14 NOTE — ED Triage Notes (Signed)
Pt reports that since Chemo tx on Wed having abd pains and weakness. Reports hx blood being low and needing transfusion.

## 2019-01-14 NOTE — ED Notes (Signed)
purwick placed on patient 

## 2019-01-14 NOTE — ED Notes (Signed)
Pt back from CT

## 2019-01-15 DIAGNOSIS — R1030 Lower abdominal pain, unspecified: Secondary | ICD-10-CM | POA: Diagnosis not present

## 2019-01-15 MED ORDER — OXYCODONE HCL 5 MG PO TABS
5.0000 mg | ORAL_TABLET | Freq: Once | ORAL | Status: AC
  Administered 2019-01-15: 5 mg via ORAL
  Filled 2019-01-15: qty 1

## 2019-01-15 MED ORDER — OXYCODONE HCL 5 MG PO TABS: 5.0000 mg | ORAL_TABLET | Freq: Four times a day (QID) | ORAL | 0 refills | Status: DC | PRN

## 2019-01-15 NOTE — Discharge Instructions (Addendum)
Thank you for allowing me to care for you today in the Emergency Department.   Please follow-up with Dr. Alvy Bimler as needed regarding pain control.  For mild to moderate pain, you can take 650 mg of Tylenol once every 6 hours.  Do not take more than 4000 mg in a 24-hour period.  For severe, uncontrollable pain, you can take 1 tablet of oxycodone by mouth every 6 hours.  You should not drive or operate machinery while taking this medication because it is a narcotic and can cause you to be impaired.  Oxycodone can also cause constipation.  Should make sure that you are taking MiraLAX regularly to prevent constipation.  Return to the emergency department if you develop severe abdominal pain that is not relieved by pain medication, abdominal pain with high fever, vomiting, if you become unable to urinate, has severe pain with urination, or other new, concerning symptoms.

## 2019-01-15 NOTE — ED Provider Notes (Signed)
[facility] DEPT Provider Note   CSN: [id number] Arrival date & time: 01/14/19  1747    History   Chief Complaint Chief Complaint  Patient presents with  . Abdominal Pain  . Fatigue    HPI Erin Esparza is a 78 y.o. 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  The patient endorses bilateral lower abdominal pain, right greater than left, that began suddenly at approximately 1700.  She characterizes the pain as sharp and nonradiating.  No history of similar.  She denies nausea, vomiting, diarrhea, dysuria, hematuria, vaginal pain or bleeding, cough, shortness of breath, diarrhea, constipation, melena, or hematochezia.   She reports that she takes Tylenol as needed at home for pain, but does not have any stronger pain medication as this typically controls her pain.  She is on MiraLAX as needed for constipation.  She reports that she had a small bowel movement today and her last normal bowel movement was yesterday.  She continues to pass flatus.  She also reports that she has been somewhat more fatigued with mild generalized weakness.  She reports that the last time she was feeling this way her hemoglobin was low and she needed a blood transfusion.  She reports that she has been eating and drinking well without difficulty at home.     The history is provided by the patient. No language interpreter was used.    Past Medical History:  Diagnosis Date  . Abdominal pain 10/17/2016  . Arthritis    "both feet" (10/17/2016)  . Asthma   . Breast cancer ([place]) 01/06/12   left breast invasive ductal ca,dcis,ER/PR=+,  . Breast mass in female   . Cataract   . Clotting disorder ([place])   .  Diastolic congestive heart failure ([place])   . DVT (deep venous thrombosis) ([place]) 1990's?   LLE  . Endometrial ca ([place]) dx'd 2020  . Family history of breast cancer   . Family history of prostate cancer   . Hypertension   . Insulin resistance   . Pulmonary embolism ([place]) ~ 2012  . Radiation 02/12/12 -03/12/12   left breast, total 50gy  . Right ankle pain   . Shortness of breath   . Venous stasis of lower extremity   . Wears dentures    upper  . Wears glasses   . Wears partial dentures    lower    Patient Active Problem List   Diagnosis Date Noted  . Peripheral neuropathy due to chemotherapy ([place]) 01/12/2019  . Pancytopenia, acquired ([place]) 09/24/2018  . CKD (chronic kidney disease), stage III ([place]) 09/24/2018  . 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 Genetic testing 09/10/2018  . Anemia due to antineoplastic chemotherapy 09/01/2018  . Other constipation 09/01/2018  . Goals of care, counseling/discussion 08/06/2018  . Family history of breast cancer   . Family history of prostate cancer   . Uterine cancer ([place]) 08/02/2018  . Hematuria 05/20/2018  . Low back pain without sciatica 01/15/2018  . Cough 06/30/2017  . Weakness 04/13/2017  . Enteritis   . Other ascites   . Urinary tract infection without hematuria   . Left upper arm pain 10/14/2016  . Prediabetes 02/27/2016  . Plantar fasciitis of right foot 02/27/2014  . CMC arthritis  06/15/2013  . Pain of skin 02/18/2013  . Paresthesia of right leg 09/30/2012  . Venous stasis of lower extremity   . Preventative health care 03/05/2012  . History of left breast cancer 01/20/2012  . 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 Leg swelling 04/09/2011  . 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 STASIS DERMATITIS, CHRONIC 10/25/2009  . DEEP VENOUS THROMBOPHLEBITIS, HX OF 07/28/2008  . OBESITY 01/27/2008  . Essential hypertension 09/24/2006  . ASTHMA, PERSISTENT 09/24/2006  . DEGENERATIVE JOINT DISEASE, KNEE 09/04/2006    Past  Surgical History:  Procedure Laterality Date  . BREAST BIOPSY Left 11/03/11   10 o'clock=benign breast parenchyma  . BREAST LUMPECTOMY Left 01/06/12    Dr. Erroll Luna  . IR IMAGING GUIDED PORT INSERTION  08/06/2018     OB History   No obstetric history on file.      Home Medications    Prior to Admission medications   Medication Sig Start Date End Date Taking? Authorizing Provider  acetaminophen (TYLENOL) 500 MG tablet Take 1 tablet (500 mg total) by mouth every 6 (six) hours as needed. Patient taking differently: Take 1,000 mg by mouth every 6 (six) hours as needed for moderate pain.  08/25/12  Yes Cook, Jayce G, DO  ADVAIR DISKUS 100-50 MCG/DOSE AEPB USE 1 INHALATION BY MOUTH TWICE DAILY Patient taking differently: Inhale 1 puff into the lungs daily.  11/08/18  Yes Lovenia Kim, MD  Calcium Carbonate (CALTRATE 600 PO) Take 1 tablet by mouth 2 (two) times daily.    Yes [provider]  dexamethasone (DECADRON) 4 MG tablet Take 2 tabs the night before and 2 tabs on the morning of chemotherapy every 3 weeks with food Patient taking differently: Take 4 mg by mouth See admin instructions. Take 2 tabs the night before and 2 tabs on the morning of chemotherapy every 3 weeks with food 01/12/19  Yes Gorsuch, Ni, MD  enalapril (VASOTEC) 20 MG tablet TAKE 1 TABLET(20 MG) BY MOUTH TWICE DAILY Patient taking differently: Take 20 mg by mouth daily.  11/10/18  Yes Lovenia Kim, MD  furosemide (LASIX) 40 MG tablet TAKE 1 TABLET BY MOUTH DAILY AS NEEDED FOR LEG SWELLING Patient taking differently: Take 40 mg by mouth daily as needed (leg swelling).  11/04/18  Yes Lovenia Kim, MD  hydrochlorothiazide (HYDRODIURIL) 25 MG tablet TAKE 1 TABLET(25 MG) BY MOUTH DAILY Patient taking differently: Take 25 mg by mouth daily.  11/18/18  Yes Lovenia Kim, MD  lidocaine-prilocaine (EMLA) cream Apply to affected area once Patient taking differently: Apply 1 application topically daily as needed. Apply to  affected area once 08/05/18  Yes Gorsuch, Ni, MD  Multiple Vitamin (MULTIVITAMIN) tablet Take 1 tablet by mouth daily.   Yes [provider]  Multiple Vitamins-Minerals (ICAPS AREDS 2 PO) Take 1 capsule by mouth 2 (two) times daily.   Yes [provider]  ondansetron (ZOFRAN) 8 MG tablet Take 1 tablet (8 mg total) by mouth every 8 (eight) hours as needed. Start on the third day after chemotherapy. 08/05/18  Yes Gorsuch, Ni, MD  polyethylene glycol (MIRALAX / GLYCOLAX) 17 g packet Take 17 g by mouth daily as needed for moderate constipation.   Yes [provider]  prochlorperazine (COMPAZINE) 10 MG tablet Take 1 tablet (10 mg total) by mouth every 6 (six) hours as needed (Nausea or vomiting). 08/05/18  Yes Gorsuch, Ni, MD  Simethicone 80 MG TABS Take 1 tablet (80 mg total) by mouth QID. Patient  taking differently: Take 80 mg by mouth 4 (four) times daily as needed. Heart burn 12/02/18  Yes Harle Stanford., PA-C  Vitamin D, Cholecalciferol, 1000 units CAPS Take 1,000 Units by mouth daily.    Yes [provider]  XARELTO 20 MG TABS tablet TAKE 1 TABLET BY MOUTH DAILY Patient taking differently: Take 20 mg by mouth daily with supper.  01/14/19  Yes Beard, Aldona Bar N, DO  oxyCODONE (ROXICODONE) 5 MG immediate release tablet Take 1 tablet (5 mg total) by mouth every 6 (six) hours as needed for severe pain. 01/15/19   McDonald, Mia A, PA-C  omeprazole (PRILOSEC) 40 MG capsule Take 1 capsule (40 mg total) by mouth daily. Patient not taking: Reported on 01/14/2019 12/02/18 01/15/19  Harle Stanford., PA-C    Family History Family History  Problem Relation Age of Onset  . Heart disease Mother   . Heart attack Mother   . Breast cancer Sister 44  . Parkinson's disease Brother   . Prostate cancer Brother   . Pulmonary embolism Daughter   . Diabetes Daughter   . Pulmonary embolism Daughter   . Breast cancer Sister   . Other Brother        MVA  . Other Brother        MVA  . COPD  Brother   . Breast cancer Niece        dx 75s    Social History Social History   Tobacco Use  . Smoking status: Former Smoker    Packs/day: 0.30    Years: 15.00    Pack years: 4.50    Types: Cigarettes  . Smokeless tobacco: Never Used  . Tobacco comment: "stopped in the 1990s"  Substance Use Topics  . Alcohol use: No  . Drug use: No     Allergies   Patient has no known allergies.   Review of Systems Review of Systems  Constitutional: Negative for activity change, chills and fever.  Respiratory: Negative for cough and shortness of breath.   Cardiovascular: Negative for chest pain.  Gastrointestinal: Positive for abdominal pain. Negative for blood in stool, constipation, diarrhea, nausea and vomiting.  Genitourinary: Negative for dysuria, flank pain, frequency, hematuria, urgency, vaginal bleeding, vaginal discharge and vaginal pain.  Musculoskeletal: Negative for back pain.  Skin: Negative for rash.  Allergic/Immunologic: Negative for immunocompromised state.  Neurological: Negative for weakness, numbness and headaches.  Psychiatric/Behavioral: Negative for confusion.     Physical Exam Updated Vital Signs BP (!) 126/91 (BP Location: Right Arm)   Pulse 88   Temp 97.9 F (36.6 C) (Oral)   Resp 17   SpO2 98%   Physical Exam Vitals signs and nursing note reviewed.  Constitutional:      General: She is not in acute distress.    Appearance: She is obese. She is not ill-appearing, toxic-appearing or diaphoretic.     Comments: Obese, elderly female in no acute distress.  HENT:     Head: Normocephalic.  Eyes:     Conjunctiva/sclera: Conjunctivae normal.  Neck:     Musculoskeletal: Neck supple.  Cardiovascular:     Rate and Rhythm: Normal rate and regular rhythm.     Pulses: Normal pulses.     Heart sounds: Normal heart sounds. No murmur. No friction rub. No gallop.   Pulmonary:     Effort: Pulmonary effort is normal. No respiratory distress.     Breath  sounds: No stridor. No wheezing, rhonchi or rales.  Chest:  Chest wall: No tenderness.  Abdominal:     General: There is no distension.     Palpations: Abdomen is soft. There is no mass.     Tenderness: There is abdominal tenderness. There is no right CVA tenderness, left CVA tenderness, guarding or rebound.     Hernia: No hernia is present.     Comments: Tender to palpation in the right pelvic region.  There is minimal tenderness to palpation on the left pelvic region.  No tenderness over McBurney's point.  No focal suprapubic tenderness.  No inguinal lymphadenopathy bilaterally.  No obvious masses or hernias, but exam is limited secondary to body habitus.  No CVA tenderness bilaterally.  Abdomen is obese, soft, nondistended.  Skin:    General: Skin is warm.     Coloration: Skin is not jaundiced.     Findings: No rash.  Neurological:     Mental Status: She is alert.  Psychiatric:        Behavior: Behavior normal.      ED Treatments / Results  Labs (all labs ordered are listed, but only abnormal results are displayed) Labs Reviewed  COMPREHENSIVE METABOLIC PANEL - Abnormal; Notable for the following components:      Result Value   Glucose, Bld 133 (*)    BUN 29 (*)    Albumin 3.3 (*)    All other components within normal limits  CBC WITH DIFFERENTIAL/PLATELET - Abnormal; Notable for the following components:   RBC 2.65 (*)    Hemoglobin 8.8 (*)    HCT 27.0 (*)    MCV 101.9 (*)    RDW 16.9 (*)    Platelets 131 (*)    Lymphs Abs 0.5 (*)    All other components within normal limits  URINALYSIS, ROUTINE W REFLEX MICROSCOPIC    EKG EKG Interpretation  Date/Time:  Friday January 14 2019 20:09:06 EDT Ventricular Rate:  82 PR Interval:    QRS Duration: 133 QT Interval:  421 QTC Calculation: 492 R Axis:   26 Text Interpretation:  Sinus rhythm Nonspecific intraventricular conduction delay Probable anteroseptal infarct, old Interpretation limited secondary to artifact  Confirmed by Ripley Fraise [phone]) on 01/14/2019 11:18:55 PM   Radiology Ct Abdomen Pelvis W Contrast  Result Date: 01/15/2019 CLINICAL DATA:  Abdominal pain and weakness. Active chemotherapy. Hernia, complicated EXAM: CT ABDOMEN AND PELVIS WITH CONTRAST TECHNIQUE: Multidetector CT imaging of the abdomen and pelvis was performed using the standard protocol following bolus administration of intravenous contrast. CONTRAST:  129mL OMNIPAQUE IOHEXOL 300 MG/ML  SOLN COMPARISON:  Abdominal CT 12/20/2018 FINDINGS: Lower chest: Hypoventilatory changes and scattered atelectasis. Coronary artery calcifications. No pleural fluid. Hepatobiliary: Small subcentimeter low-density lesions scattered throughout the liver are unchanged from prior exam. No new hepatic lesion. Gallbladder physiologically distended, no calcified stone. No biliary dilatation. Pancreas: No ductal dilatation or inflammation. Spleen: Normal in size without focal abnormality. Adrenals/Urinary Tract: No adrenal nodule. No hydronephrosis or perinephric edema. Symmetric excretion on delayed phase imaging. Simple cyst in the left kidney is again seen. Urinary bladder is physiologically distended. The right aspect of the bladder dome extends into right inguinal hernia, mild surrounding soft tissue edema, findings similar to prior exam. Stomach/Bowel: Stomach distended with fluid/ingested material. Small hiatal hernia with fluid in the distal esophagus. No small bowel wall thickening, inflammatory change, or obstruction. Normal appendix. High-riding cecum in the right mid abdomen. Moderate volume of stool in the colon. No colonic wall thickening or inflammation. Vascular/Lymphatic: Aortoiliac atherosclerosis. No aneurysm. Prominent retroperitoneal and  bilateral iliac nodes are similar to recent exam. For example left periaortic node measures 9 mm, series 2, image 33. Left external iliac node measures 15 mm, image 67, unchanged. No progressive or new  adenopathy. Reproductive: Uterus unchanged appearance, slightly bulbous. No adnexal mass. Other: A questionable ventral omental nodule on prior exam is tentatively visualized, image 41 series 2. Right inguinal hernia contains anterolateral aspect of the bladder, unchanged. Previous left perirectal fluid has resolved. No free air. No new ascites. Musculoskeletal: Multilevel degenerative change in the spine. There are no acute or suspicious osseous abnormalities. IMPRESSION: 1. Small hiatal hernia with fluid in the distal esophagus, which may represent reflux or esophagitis. No other acute findings. 2. Right inguinal hernia contains anterolateral aspect of the urinary bladder, unchanged in appearance from prior. Mild edema within the hernia sac is similar. 3. Unchanged small low-density lesions in the liver. 4. Unchanged retroperitoneal and bilateral iliac lymph nodes. Small ventral abdominal omental nodule tentatively identified and unchanged from prior. Aortic Atherosclerosis (ICD10-I70.0). Electronically Signed   By: Keith Rake M.D.   On: 01/15/2019 00:04    Procedures Procedures (including critical care time)  Medications Ordered in ED Medications  fentaNYL (SUBLIMAZE) injection 100 mcg (100 mcg Intravenous Given 01/14/19 2248)  iohexol (OMNIPAQUE) 300 MG/ML solution 100 mL (100 mLs Intravenous Contrast Given 01/14/19 2259)  oxyCODONE (Oxy IR/ROXICODONE) immediate release tablet 5 mg (5 mg Oral Given 01/15/19 0025)     Initial Impression / Assessment and Plan / ED Course  I have reviewed the triage vital signs and the nursing notes.  Pertinent labs & imaging results that were available during my care of the patient were reviewed by me and considered in my medical decision making (see chart for details).        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  No other associated symptoms.  She has been taking MiraLAX for constipation, but reports that she had a normal bowel movement yesterday and has no history of bowel obstruction.    On exam, pain is more focally in the right pelvic area.  No obvious masses or hernia, but exam is limited secondary to body habitus.  Labs are grossly reassuring without significant electrolyte abnormalities, leukocytosis and hemoglobin is stable from previous at 8.8.  UA is unremarkable.  Reviewed CT abdomen pelvis from December 20, 2018 there was a left perirectal, presacral fluid collection that was noted as well as a right inguinal hernia containing a small portion of the bladder that has been noticed on previous scans before.  Given sudden onset of her pain in the area where the right inguinal hernia is located, will order CT abdomen pelvis to ensure hernia is not incarcerated or strangulated given limited abdominal exam.  The patient was seen and independently evaluated by Dr. Christy Gentles, attending physician who is in agreement with the work-up and plan.  Fentanyl given for pain and on reevaluation, patient reports that she is much more comfortable.  CT abdomen pelvis with no acute findings, specifically no strangulated or incarcerated right inguinal hernia.  I think it is reasonable to discharge the patient home with pain medication at this time. A 51-month prescription history query was performed using the [place] CSRS prior to discharge.  We will discharge with a short course  of oxycodone, and patient can follow-up with oncology as needed.  Patient and family are in agreement with plan.  All questions answered.  ER return precautions given.  She is hemodynamically stable and in no acute distress.  Safe for discharge to home with oncology follow-up as needed.   Final Clinical  Impressions(s) / ED Diagnoses   Final diagnoses:  Lower abdominal pain  Reducible right inguinal hernia  Hiatal hernia    ED Discharge Orders         Ordered    oxyCODONE (ROXICODONE) 5 MG immediate release tablet  Every 6 hours PRN     01/15/19 0023           McDonald, Mia A, PA-C 01/15/19 0730    Ripley Fraise, MD 01/16/19 [phone]

## 2019-02-02 ENCOUNTER — Inpatient Hospital Stay: Payer: Medicare Other

## 2019-02-02 ENCOUNTER — Other Ambulatory Visit: Payer: Self-pay

## 2019-02-02 ENCOUNTER — Inpatient Hospital Stay: Payer: Medicare Other | Attending: Gynecologic Oncology | Admitting: Hematology and Oncology

## 2019-02-02 DIAGNOSIS — Z170 Estrogen receptor positive status [ER+]: Secondary | ICD-10-CM | POA: Diagnosis not present

## 2019-02-02 DIAGNOSIS — T451X5A Adverse effect of antineoplastic and immunosuppressive drugs, initial encounter: Secondary | ICD-10-CM

## 2019-02-02 DIAGNOSIS — C50212 Malignant neoplasm of upper-inner quadrant of left female breast: Secondary | ICD-10-CM | POA: Insufficient documentation

## 2019-02-02 DIAGNOSIS — D6481 Anemia due to antineoplastic chemotherapy: Secondary | ICD-10-CM | POA: Diagnosis not present

## 2019-02-02 DIAGNOSIS — G620 Drug-induced polyneuropathy: Secondary | ICD-10-CM | POA: Diagnosis not present

## 2019-02-02 DIAGNOSIS — Z452 Encounter for adjustment and management of vascular access device: Secondary | ICD-10-CM | POA: Diagnosis not present

## 2019-02-02 DIAGNOSIS — C55 Malignant neoplasm of uterus, part unspecified: Secondary | ICD-10-CM

## 2019-02-02 DIAGNOSIS — Z79811 Long term (current) use of aromatase inhibitors: Secondary | ICD-10-CM | POA: Diagnosis not present

## 2019-02-02 DIAGNOSIS — C541 Malignant neoplasm of endometrium: Secondary | ICD-10-CM | POA: Diagnosis present

## 2019-02-02 LAB — CBC WITH DIFFERENTIAL/PLATELET
Abs Immature Granulocytes: 0.11 10*3/uL — ABNORMAL HIGH (ref 0.00–0.07)
Basophils Absolute: 0 10*3/uL (ref 0.0–0.1)
Basophils Relative: 0 %
Eosinophils Absolute: 0 10*3/uL (ref 0.0–0.5)
Eosinophils Relative: 0 %
HCT: 24.4 % — ABNORMAL LOW (ref 36.0–46.0)
Hemoglobin: 8.1 g/dL — ABNORMAL LOW (ref 12.0–15.0)
Immature Granulocytes: 1 %
Lymphocytes Relative: 5 %
Lymphs Abs: 0.5 10*3/uL — ABNORMAL LOW (ref 0.7–4.0)
MCH: 33.8 pg (ref 26.0–34.0)
MCHC: 33.2 g/dL (ref 30.0–36.0)
MCV: 101.7 fL — ABNORMAL HIGH (ref 80.0–100.0)
Monocytes Absolute: 0.3 10*3/uL (ref 0.1–1.0)
Monocytes Relative: 3 %
Neutro Abs: 9.5 10*3/uL — ABNORMAL HIGH (ref 1.7–7.7)
Neutrophils Relative %: 91 %
Platelets: 133 10*3/uL — ABNORMAL LOW (ref 150–400)
RBC: 2.4 MIL/uL — ABNORMAL LOW (ref 3.87–5.11)
RDW: 16.9 % — ABNORMAL HIGH (ref 11.5–15.5)
WBC: 10.5 10*3/uL (ref 4.0–10.5)
nRBC: 0 % (ref 0.0–0.2)

## 2019-02-02 LAB — COMPREHENSIVE METABOLIC PANEL
ALT: 8 U/L (ref 0–44)
AST: 13 U/L — ABNORMAL LOW (ref 15–41)
Albumin: 3.2 g/dL — ABNORMAL LOW (ref 3.5–5.0)
Alkaline Phosphatase: 77 U/L (ref 38–126)
Anion gap: 9 (ref 5–15)
BUN: 22 mg/dL (ref 8–23)
CO2: 25 mmol/L (ref 22–32)
Calcium: 9.1 mg/dL (ref 8.9–10.3)
Chloride: 105 mmol/L (ref 98–111)
Creatinine, Ser: 1.02 mg/dL — ABNORMAL HIGH (ref 0.44–1.00)
GFR calc Af Amer: >60 mL/min (ref >60)
GFR calc non Af Amer: 52 mL/min — ABNORMAL LOW (ref >60)
Glucose, Bld: 137 mg/dL — ABNORMAL HIGH (ref 70–99)
Potassium: 4.1 mmol/L (ref 3.5–5.1)
Sodium: 139 mmol/L (ref 135–145)
Total Bilirubin: <0.2 mg/dL — ABNORMAL LOW (ref 0.3–1.2)
Total Protein: 7.5 g/dL (ref 6.5–8.1)

## 2019-02-02 LAB — SAMPLE TO BLOOD BANK

## 2019-02-02 MED ORDER — SODIUM CHLORIDE 0.9% FLUSH
10.0000 mL | Freq: Once | INTRAVENOUS | Status: AC
  Administered 2019-02-02: 10 mL
  Filled 2019-02-02: qty 10

## 2019-02-03 ENCOUNTER — Encounter: Payer: Self-pay | Admitting: Hematology and Oncology

## 2019-02-03 LAB — CA 125: Cancer Antigen (CA) 125: 7.8 U/mL (ref 0.0–38.1)

## 2019-02-03 NOTE — Assessment & Plan Note (Signed)
She denies worsening neuropathy from treatment. Observe only for now

## 2019-02-03 NOTE — Assessment & Plan Note (Signed)
The patient started to experience multiple side effects of treatment She went to the emergency department recently and had CT scan which showed no evidence of disease progression She is getting progressively anemic The patient has achieved maximum response with chemotherapy I recommend chemotherapy break to allow recovery from side effects and she agreed I plan to see her again in about 8 weeks for further supportive care and repeat blood work and plan to repeat CT imaging at the end of the year

## 2019-02-03 NOTE — Assessment & Plan Note (Signed)
She has severe progressive anemia secondary to treatment She is mildly symptomatic She does not need transfusion support right now As above, I plan to discontinue treatment and allow recovery of her blood count

## 2019-02-03 NOTE — Progress Notes (Signed)
[facility] OFFICE PROGRESS NOTE  Patient Care Team: [name], DO as PCP - General (Family Medicine) [name], MD as Consulting Physician (Ophthalmology) [name], DPM as Consulting Physician (Podiatry) [name], [name], MD as Referring Physician (Optometry) [name], MD as Consulting Physician (Orthopedic Surgery) [name], RN as Oncology Nurse Navigator (Oncology)  ASSESSMENT & PLAN:  Uterine cancer [facility]) The patient started to experience multiple side effects of treatment She went to the emergency department recently and had CT scan which showed no evidence of disease progression She is getting progressively anemic The patient has achieved maximum response with chemotherapy I recommend chemotherapy break to allow recovery from side effects and she agreed I plan to see her again in about 8 weeks for further supportive care and repeat blood work and plan to repeat CT imaging at the end of the year  Anemia due to antineoplastic chemotherapy She has severe progressive anemia secondary to treatment She is mildly symptomatic She does not need transfusion support right now As above, I plan to discontinue treatment and allow recovery of her blood count  Peripheral neuropathy due to chemotherapy [facility]) She denies worsening neuropathy from treatment. Observe only for now   No orders of the defined types were placed in this encounter.   INTERVAL HISTORY: Please see below for problem oriented charting. 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 Ultrasound irregular hypoechoic mass 7 x 6 x 5 mm   01/06/2012 Initial Biopsy   Initial biopsy was benign . 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 Cervix, biopsy - HIGH GRADE CARCINOMA. - SEE COMMENT. 2. Endocervix, curettage - HIGH GRADE CARCINOMA. - SEE COMMENT. 3. Endometrium, biopsy - HIGH GRADE CARCINOMA. - SEE COMMENT. Microscopic Comment 1. - 3. The carcinoma in the three specimens is morphologically similar and has features suggestive of papillary serous carcinoma.   07/26/2018 Tumor Marker   Patient's tumor was tested for the following markers: CA-125 Results of the tumor marker test revealed 835   07/28/2018 Imaging   1. Several (at least 10) solid pulmonary nodules scattered throughout both lungs are new since 2012 chest CT, likely representing pulmonary metastases, largest 6 mm, below  PET resolution. 2. Redemonstration of hypodense 2.5 cm segment 7 right liver lobe mass, indeterminate, suspicious for liver metastasis. 3. 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 4. Three-vessel  coronary atherosclerosis.  Aortic Atherosclerosis (ICD10-I70.0).   08/03/2018 Cancer Staging   Staging form: Corpus Uteri - Carcinoma and Carcinosarcoma, AJCC 8th Edition - Clinical: FIGO Stage IVB (cT3b, cN2, cM1) - Signed by [name], MD on 08/03/2018   08/05/2018 Tumor Marker   Patient's tumor was tested for the following markers: CA-125 Results of the tumor marker test revealed 901   08/06/2018 Procedure   Placement of single lumen port a cath via right internal jugular vein. The catheter tip lies at the cavo-atrial junction. A power injectable port a cath was placed and is ready for immediate use.    08/11/2018 -  Chemotherapy   The patient had carboplatin and taxol   09/01/2018 Tumor Marker   Patient's tumor was tested for the following markers: CA-125 Results of the tumor marker test revealed 406   09/09/2018 Genetic Testing   The Common Hereditary Cancers Panel offered by [facility] includes sequencing and/or deletion duplication testing of the following 48 genes: APC, ATM, AXIN2, BARD1, BMPR1A, BRCA1, BRCA2, BRIP1, CDH1, CDKN2A (p14ARF), CDKN2A (p16INK4a), CKD4, CHEK2, CTNNA1, DICER1, EPCAM (Deletion/duplication testing only), GREM1 (promoter region deletion/duplication testing only), KIT, MEN1, MLH1, MSH2, MSH3, MSH6, MUTYH, NBN, NF1, NHTL1, PALB2, PDGFRA, PMS2, POLD1, POLE, PTEN, RAD50, RAD51C, RAD51D, RNF43, SDHB, SDHC, SDHD, SMAD4, SMARCA4. STK11, TP53, TSC1, TSC2, and VHL.  The following genes were evaluated for sequence changes only: SDHA and HOXB13 c.251G>A variant only.  Results: Negative, no pathogenic variants identified. The report date is 09/09/2018.     09/22/2018 Tumor Marker   Patient's tumor was tested for the following markers: CA-125 Results of the tumor marker test revealed 110   10/12/2018 Tumor Marker   Patient's tumor was tested for the following markers: CA-125 Results of the tumor marker test revealed 55.3   10/12/2018 Imaging   1. Mild decrease in pulmonary  metastases, liver metastases, and metastatic abdominal and pelvic lymphadenopathy. 2. Decreased size of complex cystic lesion in left presacral region. 3. No new or progressive metastatic disease identified. 4. Stable small right inguinal hernia containing small portion of urinary bladder.   11/03/2018 Tumor Marker   Patient's tumor was tested for the following markers: CA-125 Results of the tumor marker test revealed 34.3    Genetic Testing   Patient has genetic testing done for HER2. Results revealed patient has the following: HER2 - negative from pathology from 07/26/18.   12/20/2018 Imaging   1. Possible new omental nodule. 2. Improving abdominal and pelvic retroperitoneal adenopathy. 3. Stable pulmonary and hepatic metastatic disease. 4. Left perirectal/presacral fluid collection has decreased in size. 5. Right inguinal hernia contains a small portion of the bladder, as before. 6. Aortic atherosclerosis (ICD10-170.0). Coronary artery calcification   12/21/2018 Tumor Marker   Patient's tumor was tested for the following markers: CA-125 Results of the tumor marker test revealed 15.6   01/15/2019 Imaging   Ct abdomen and pelvis 1. Small hiatal hernia with fluid in the distal esophagus, which may represent reflux or esophagitis. No other acute findings. 2. Right inguinal hernia contains anterolateral aspect of the urinary bladder, unchanged in appearance from prior. Mild edema within the hernia sac is similar. 3. Unchanged small low-density lesions in the liver. 4. Unchanged retroperitoneal and bilateral iliac lymph nodes. Small ventral abdominal omental nodule tentatively identified and unchanged from prior.   Aortic  Atherosclerosis (ICD10-I70.0).   02/02/2019 Tumor Marker   Patient's tumor was tested for the following markers: CA-125 Results of the tumor marker test revealed 7.8     REVIEW OF SYSTEMS:   Constitutional: Denies fevers, chills or abnormal weight loss Eyes: Denies  blurriness of vision Ears, nose, mouth, throat, and face: Denies mucositis or sore throat Respiratory: Denies cough, dyspnea or wheezes Cardiovascular: Denies palpitation, chest discomfort or lower extremity swelling Gastrointestinal:  Denies nausea, heartburn or change in bowel habits Skin: Denies abnormal skin rashes Lymphatics: Denies new lymphadenopathy or easy bruising Behavioral/Psych: Mood is stable, no new changes  All other systems were reviewed with the patient and are negative.  I have reviewed the past medical history, past surgical history, social history and family history with the patient and they are unchanged from previous note.  ALLERGIES:  has No Known Allergies.  MEDICATIONS:  Current Outpatient Medications  Medication Sig Dispense Refill  . acetaminophen (TYLENOL) 500 MG tablet Take 1 tablet (500 mg total) by mouth every 6 (six) hours as needed. (Patient taking differently: Take 1,000 mg by mouth every 6 (six) hours as needed for moderate pain. ) 30 tablet 3  . ADVAIR DISKUS 100-50 MCG/DOSE AEPB USE 1 INHALATION BY MOUTH TWICE DAILY (Patient taking differently: Inhale 1 puff into the lungs daily. ) 60 each 0  . Calcium Carbonate (CALTRATE 600 PO) Take 1 tablet by mouth 2 (two) times daily.     Marland Kitchen dexamethasone (DECADRON) 4 MG tablet Take 2 tabs the night before and 2 tabs on the morning of chemotherapy every 3 weeks with food (Patient taking differently: Take 4 mg by mouth See admin instructions. Take 2 tabs the night before and 2 tabs on the morning of chemotherapy every 3 weeks with food) 12 tablet 1  . enalapril (VASOTEC) 20 MG tablet TAKE 1 TABLET(20 MG) BY MOUTH TWICE DAILY (Patient taking differently: Take 20 mg by mouth daily. ) 180 tablet 2  . furosemide (LASIX) 40 MG tablet TAKE 1 TABLET BY MOUTH DAILY AS NEEDED FOR LEG SWELLING (Patient taking differently: Take 40 mg by mouth daily as needed (leg swelling). ) 90 tablet 2  . hydrochlorothiazide (HYDRODIURIL) 25 MG  tablet TAKE 1 TABLET(25 MG) BY MOUTH DAILY (Patient taking differently: Take 25 mg by mouth daily. ) 90 tablet 1  . lidocaine-prilocaine (EMLA) cream Apply to affected area once (Patient taking differently: Apply 1 application topically daily as needed. Apply to affected area once) 30 g 3  . Multiple Vitamin (MULTIVITAMIN) tablet Take 1 tablet by mouth daily.    . Multiple Vitamins-Minerals (ICAPS AREDS 2 PO) Take 1 capsule by mouth 2 (two) times daily.    . ondansetron (ZOFRAN) 8 MG tablet Take 1 tablet (8 mg total) by mouth every 8 (eight) hours as needed. Start on the third day after chemotherapy. 30 tablet 1  . oxyCODONE (ROXICODONE) 5 MG immediate release tablet Take 1 tablet (5 mg total) by mouth every 6 (six) hours as needed for severe pain. 15 tablet 0  . polyethylene glycol (MIRALAX / GLYCOLAX) 17 g packet Take 17 g by mouth daily as needed for moderate constipation.    . prochlorperazine (COMPAZINE) 10 MG tablet Take 1 tablet (10 mg total) by mouth every 6 (six) hours as needed (Nausea or vomiting). 30 tablet 1  . Simethicone 80 MG TABS Take 1 tablet (80 mg total) by mouth QID. (Patient taking differently: Take 80 mg by mouth 4 (four) times daily as needed. Heart  burn) 120 tablet 2  . Vitamin D, Cholecalciferol, 1000 units CAPS Take 1,000 Units by mouth daily.     Alveda Reasons 20 MG TABS tablet TAKE 1 TABLET BY MOUTH DAILY (Patient taking differently: Take 20 mg by mouth daily with supper. ) 90 tablet 0   No current facility-administered medications for this visit.     PHYSICAL EXAMINATION: ECOG PERFORMANCE STATUS: 2 - Symptomatic, <50% confined to bed  Vitals:   02/02/19 1018  BP: 135/63  Pulse: 84  Resp: 18  Temp: 98.2 F (36.8 C)  SpO2: 100%   Filed Weights   02/02/19 1018  Weight: 257 lb 6.4 oz (116.8 kg)    GENERAL:alert, no distress and comfortable NEURO: alert & oriented x 3 with fluent speech, no focal motor/sensory deficits  LABORATORY DATA:  I have reviewed the  data as listed    Component Value Date/Time   NA 139 02/02/2019 1005   NA 142 12/25/2014 1122   K 4.1 02/02/2019 1005   K 4.1 12/25/2014 1122   CL 105 02/02/2019 1005   CL 102 10/04/2012 1159   CO2 25 02/02/2019 1005   CO2 29 12/25/2014 1122   GLUCOSE 137 (H) 02/02/2019 1005   GLUCOSE 99 12/25/2014 1122   GLUCOSE 116 (H) 10/04/2012 1159   BUN 22 02/02/2019 1005   BUN 13.7 12/25/2014 1122   CREATININE 1.02 (H) 02/02/2019 1005   CREATININE 1.08 (H) 12/02/2018 1432   CREATININE 0.96 (H) 02/27/2016 1530   CREATININE 0.9 12/25/2014 1122   CALCIUM 9.1 02/02/2019 1005   CALCIUM 10.1 12/25/2014 1122   PROT 7.5 02/02/2019 1005   PROT 7.8 12/25/2014 1122   ALBUMIN 3.2 (L) 02/02/2019 1005   ALBUMIN 3.4 (L) 12/25/2014 1122   AST 13 (L) 02/02/2019 1005   AST 22 12/02/2018 1432   AST 21 12/25/2014 1122   ALT 8 02/02/2019 1005   ALT 13 12/02/2018 1432   ALT 17 12/25/2014 1122   ALKPHOS 77 02/02/2019 1005   ALKPHOS 99 12/25/2014 1122   BILITOT <0.2 (L) 02/02/2019 1005   BILITOT <0.3 (L) 12/02/2018 1432   BILITOT 0.41 12/25/2014 1122   GFRNONAA 52 (L) 02/02/2019 1005   GFRNONAA 49 (L) 12/02/2018 1432   GFRNONAA 58 (L) 02/27/2016 1530   GFRAA >60 02/02/2019 1005   GFRAA 57 (L) 12/02/2018 1432   GFRAA 66 02/27/2016 1530    No results found for: SPEP, UPEP  Lab Results  Component Value Date   WBC 10.5 02/02/2019   NEUTROABS 9.5 (H) 02/02/2019   HGB 8.1 (L) 02/02/2019   HCT 24.4 (L) 02/02/2019   MCV 101.7 (H) 02/02/2019   PLT 133 (L) 02/02/2019      Chemistry      Component Value Date/Time   NA 139 02/02/2019 1005   NA 142 12/25/2014 1122   K 4.1 02/02/2019 1005   K 4.1 12/25/2014 1122   CL 105 02/02/2019 1005   CL 102 10/04/2012 1159   CO2 25 02/02/2019 1005   CO2 29 12/25/2014 1122   BUN 22 02/02/2019 1005   BUN 13.7 12/25/2014 1122   CREATININE 1.02 (H) 02/02/2019 1005   CREATININE 1.08 (H) 12/02/2018 1432   CREATININE 0.96 (H) 02/27/2016 1530   CREATININE 0.9  12/25/2014 1122      Component Value Date/Time   CALCIUM 9.1 02/02/2019 1005   CALCIUM 10.1 12/25/2014 1122   ALKPHOS 77 02/02/2019 1005   ALKPHOS 99 12/25/2014 1122   AST 13 (L) 02/02/2019 1005  AST 22 12/02/2018 1432   AST 21 12/25/2014 1122   ALT 8 02/02/2019 1005   ALT 13 12/02/2018 1432   ALT 17 12/25/2014 1122   BILITOT <0.2 (L) 02/02/2019 1005   BILITOT <0.3 (L) 12/02/2018 1432   BILITOT 0.41 12/25/2014 1122       RADIOGRAPHIC STUDIES: I have personally reviewed the radiological images as listed and agreed with the findings in the report. Ct Abdomen Pelvis W Contrast  Result Date: 01/15/2019 CLINICAL DATA:  Abdominal pain and weakness. Active chemotherapy. Hernia, complicated EXAM: CT ABDOMEN AND PELVIS WITH CONTRAST TECHNIQUE: Multidetector CT imaging of the abdomen and pelvis was performed using the standard protocol following bolus administration of intravenous contrast. CONTRAST:  156m OMNIPAQUE IOHEXOL 300 MG/ML  SOLN COMPARISON:  Abdominal CT 12/20/2018 FINDINGS: Lower chest: Hypoventilatory changes and scattered atelectasis. Coronary artery calcifications. No pleural fluid. Hepatobiliary: Small subcentimeter low-density lesions scattered throughout the liver are unchanged from prior exam. No new hepatic lesion. Gallbladder physiologically distended, no calcified stone. No biliary dilatation. Pancreas: No ductal dilatation or inflammation. Spleen: Normal in size without focal abnormality. Adrenals/Urinary Tract: No adrenal nodule. No hydronephrosis or perinephric edema. Symmetric excretion on delayed phase imaging. Simple cyst in the left kidney is again seen. Urinary bladder is physiologically distended. The right aspect of the bladder dome extends into right inguinal hernia, mild surrounding soft tissue edema, findings similar to prior exam. Stomach/Bowel: Stomach distended with fluid/ingested material. Small hiatal hernia with fluid in the distal esophagus. No small bowel  wall thickening, inflammatory change, or obstruction. Normal appendix. High-riding cecum in the right mid abdomen. Moderate volume of stool in the colon. No colonic wall thickening or inflammation. Vascular/Lymphatic: Aortoiliac atherosclerosis. No aneurysm. Prominent retroperitoneal and bilateral iliac nodes are similar to recent exam. For example left periaortic node measures 9 mm, series 2, image 33. Left external iliac node measures 15 mm, image 67, unchanged. No progressive or new adenopathy. Reproductive: Uterus unchanged appearance, slightly bulbous. No adnexal mass. Other: A questionable ventral omental nodule on prior exam is tentatively visualized, image 41 series 2. Right inguinal hernia contains anterolateral aspect of the bladder, unchanged. Previous left perirectal fluid has resolved. No free air. No new ascites. Musculoskeletal: Multilevel degenerative change in the spine. There are no acute or suspicious osseous abnormalities. IMPRESSION: 1. Small hiatal hernia with fluid in the distal esophagus, which may represent reflux or esophagitis. No other acute findings. 2. Right inguinal hernia contains anterolateral aspect of the urinary bladder, unchanged in appearance from prior. Mild edema within the hernia sac is similar. 3. Unchanged small low-density lesions in the liver. 4. Unchanged retroperitoneal and bilateral iliac lymph nodes. Small ventral abdominal omental nodule tentatively identified and unchanged from prior. Aortic Atherosclerosis (ICD10-I70.0). Electronically Signed   By: MKeith RakeM.D.   On: 01/15/2019 00:04    All questions were answered. The patient knows to call the clinic with any problems, questions or concerns. No barriers to learning was detected.  I spent 15 minutes counseling the patient face to face. The total time spent in the appointment was 20 minutes and more than 50% was on counseling and review of test results  NHeath Lark MD 02/03/2019 10:00 AM

## 2019-02-04 ENCOUNTER — Telehealth: Payer: Self-pay | Admitting: Hematology and Oncology

## 2019-02-04 NOTE — Telephone Encounter (Signed)
Scheduled appt per 9/3 sch message - pt aware of appt date and time

## 2019-02-07 ENCOUNTER — Other Ambulatory Visit: Payer: Self-pay

## 2019-02-07 ENCOUNTER — Encounter (HOSPITAL_COMMUNITY): Payer: Self-pay

## 2019-02-07 ENCOUNTER — Emergency Department (HOSPITAL_COMMUNITY): Payer: Medicare Other

## 2019-02-07 ENCOUNTER — Emergency Department (HOSPITAL_COMMUNITY)
Admission: EM | Admit: 2019-02-07 | Discharge: 2019-02-07 | Disposition: A | Discharge status: Home / Self Care | Payer: Medicare Other | Attending: Emergency Medicine | Admitting: Emergency Medicine

## 2019-02-07 DIAGNOSIS — Z853 Personal history of malignant neoplasm of breast: Secondary | ICD-10-CM | POA: Diagnosis not present

## 2019-02-07 DIAGNOSIS — K4091 Unilateral inguinal hernia, without obstruction or gangrene, recurrent: Secondary | ICD-10-CM

## 2019-02-07 DIAGNOSIS — R1031 Right lower quadrant pain: Secondary | ICD-10-CM | POA: Diagnosis not present

## 2019-02-07 DIAGNOSIS — Z87891 Personal history of nicotine dependence: Secondary | ICD-10-CM | POA: Insufficient documentation

## 2019-02-07 DIAGNOSIS — J45909 Unspecified asthma, uncomplicated: Secondary | ICD-10-CM | POA: Insufficient documentation

## 2019-02-07 DIAGNOSIS — Z79899 Other long term (current) drug therapy: Secondary | ICD-10-CM | POA: Diagnosis not present

## 2019-02-07 DIAGNOSIS — I5032 Chronic diastolic (congestive) heart failure: Secondary | ICD-10-CM | POA: Diagnosis not present

## 2019-02-07 DIAGNOSIS — N183 Chronic kidney disease, stage 3 (moderate): Secondary | ICD-10-CM | POA: Diagnosis not present

## 2019-02-07 DIAGNOSIS — I130 Hypertensive heart and chronic kidney disease with heart failure and stage 1 through stage 4 chronic kidney disease, or unspecified chronic kidney disease: Secondary | ICD-10-CM | POA: Insufficient documentation

## 2019-02-07 DIAGNOSIS — K4090 Unilateral inguinal hernia, without obstruction or gangrene, not specified as recurrent: Secondary | ICD-10-CM | POA: Insufficient documentation

## 2019-02-07 LAB — CBC
HCT: 24.7 % — ABNORMAL LOW (ref 36.0–46.0)
Hemoglobin: 8.1 g/dL — ABNORMAL LOW (ref 12.0–15.0)
MCH: 34.2 pg — ABNORMAL HIGH (ref 26.0–34.0)
MCHC: 32.8 g/dL (ref 30.0–36.0)
MCV: 104.2 fL — ABNORMAL HIGH (ref 80.0–100.0)
Platelets: 147 10*3/uL — ABNORMAL LOW (ref 150–400)
RBC: 2.37 MIL/uL — ABNORMAL LOW (ref 3.87–5.11)
RDW: 17.2 % — ABNORMAL HIGH (ref 11.5–15.5)
WBC: 8.9 10*3/uL (ref 4.0–10.5)
nRBC: 0 % (ref 0.0–0.2)

## 2019-02-07 LAB — URINALYSIS, ROUTINE W REFLEX MICROSCOPIC
Bilirubin Urine: NEGATIVE
Glucose, UA: NEGATIVE mg/dL
Hgb urine dipstick: NEGATIVE
Ketones, ur: NEGATIVE mg/dL
Leukocytes,Ua: NEGATIVE
Nitrite: NEGATIVE
Protein, ur: NEGATIVE mg/dL
Specific Gravity, Urine: 1.027 (ref 1.005–1.030)
pH: 6 (ref 5.0–8.0)

## 2019-02-07 LAB — COMPREHENSIVE METABOLIC PANEL
ALT: 11 U/L (ref 0–44)
AST: 15 U/L (ref 15–41)
Albumin: 3.4 g/dL — ABNORMAL LOW (ref 3.5–5.0)
Alkaline Phosphatase: 68 U/L (ref 38–126)
Anion gap: 12 (ref 5–15)
BUN: 26 mg/dL — ABNORMAL HIGH (ref 8–23)
CO2: 24 mmol/L (ref 22–32)
Calcium: 9.1 mg/dL (ref 8.9–10.3)
Chloride: 102 mmol/L (ref 98–111)
Creatinine, Ser: 0.97 mg/dL (ref 0.44–1.00)
GFR calc Af Amer: >60 mL/min (ref >60)
GFR calc non Af Amer: 56 mL/min — ABNORMAL LOW (ref >60)
Glucose, Bld: 118 mg/dL — ABNORMAL HIGH (ref 70–99)
Potassium: 3.7 mmol/L (ref 3.5–5.1)
Sodium: 138 mmol/L (ref 135–145)
Total Bilirubin: 0.2 mg/dL — ABNORMAL LOW (ref 0.3–1.2)
Total Protein: 7.3 g/dL (ref 6.5–8.1)

## 2019-02-07 LAB — LIPASE, BLOOD: Lipase: 22 U/L (ref 11–51)

## 2019-02-07 MED ORDER — IOHEXOL 300 MG/ML  SOLN
100.0000 mL | Freq: Once | INTRAMUSCULAR | Status: AC | PRN
  Administered 2019-02-07: 100 mL via INTRAVENOUS

## 2019-02-07 MED ORDER — OXYCODONE HCL 5 MG PO TABS
5.0000 mg | ORAL_TABLET | Freq: Once | ORAL | Status: AC
  Administered 2019-02-07: 5 mg via ORAL
  Filled 2019-02-07: qty 1

## 2019-02-07 MED ORDER — FENTANYL CITRATE (PF) 100 MCG/2ML IJ SOLN
100.0000 ug | Freq: Once | INTRAMUSCULAR | Status: AC
  Administered 2019-02-07: 100 ug via INTRAVENOUS
  Filled 2019-02-07: qty 2

## 2019-02-07 MED ORDER — FENTANYL CITRATE (PF) 100 MCG/2ML IJ SOLN
50.0000 ug | Freq: Once | INTRAMUSCULAR | Status: AC
  Administered 2019-02-07: 50 ug via INTRAVENOUS
  Filled 2019-02-07: qty 2

## 2019-02-07 MED ORDER — SODIUM CHLORIDE 0.9 % IV BOLUS
1000.0000 mL | Freq: Once | INTRAVENOUS | Status: AC
  Administered 2019-02-07: 1000 mL via INTRAVENOUS

## 2019-02-07 MED ORDER — SODIUM CHLORIDE (PF) 0.9 % IJ SOLN
INTRAMUSCULAR | Status: AC
  Filled 2019-02-07: qty 50

## 2019-02-07 MED ORDER — ONDANSETRON HCL 4 MG/2ML IJ SOLN
4.0000 mg | Freq: Once | INTRAMUSCULAR | Status: AC
  Administered 2019-02-07: 4 mg via INTRAVENOUS
  Filled 2019-02-07: qty 2

## 2019-02-07 MED ORDER — HEPARIN SOD (PORK) LOCK FLUSH 100 UNIT/ML IV SOLN
500.0000 [IU] | Freq: Once | INTRAVENOUS | Status: AC
  Administered 2019-02-07: 500 [IU]
  Filled 2019-02-07: qty 5

## 2019-02-07 NOTE — ED Notes (Signed)
Dr. Floyd at bedside. 

## 2019-02-07 NOTE — ED Triage Notes (Addendum)
Patient with history of cancer- last chemo treatment completed last week, per patient. Patient reports abdominal pain, 10/10. Patient reports she has history of hernia and has home oxycodone. Patient states she took her home oxycodone at 1400 prior to coming to the hospital, but has not experienced any relief. Patient reports "I got a shot of pain medicine last time I was here and it really helped." Patient denies nausea/vomiting/diarrhea. Patient denies cough/CP/SOB. Patient denies fever. Patient denies dysuria. Patient reports recently having a blood transfusion due to low hemoglobin.

## 2019-02-07 NOTE — ED Provider Notes (Signed)
Erin Esparza   CSN: [id number] Arrival date & time: 02/07/19  1540     History   Chief Complaint Chief Complaint  Patient presents with  . Abdominal Pain    HPI Erin Esparza is a 78 y.o. female.     78 yo F with a chief complaints of right lower quadrant abdominal pain.  Patient had a bout similar to this and was seen in the ED couple weeks ago.  At that point she had some medicine through the IV that made her feel much better.  She would like that again.  No nausea or vomiting no fevers no urinary symptoms.  Pain started a couple hours ago.  Sharp constant.  She was told that she had a hernia on her last CT scan.  She is currently having a break from chemotherapy due to side effects.  Has a history of uterine cancer.  The history is provided by the patient.  Abdominal Pain Pain location:  RLQ Pain quality: sharp and shooting   Pain radiates to:  Does not radiate Pain severity:  Moderate Onset quality:  Gradual Duration:  2 days Timing:  Constant Progression:  Worsening Chronicity:  New Relieved by:  Nothing Worsened by:  Nothing Ineffective treatments:  None tried Associated symptoms: no chest pain, no chills, no dysuria, no fever, no nausea, no shortness of breath and no vomiting     Past Medical History:  Diagnosis Date  . Abdominal pain 10/17/2016  . Arthritis    "both feet" (10/17/2016)  . Asthma   . Breast cancer ([place]) 01/06/12   left breast invasive ductal ca,dcis,ER/PR=+,  . Breast mass in female   . Cataract   . Clotting disorder ([place])   . Diastolic congestive heart failure ([place])   . DVT (deep venous thrombosis) ([place]) 1990's?   LLE  . Endometrial ca ([place]) dx'd 2020  . Family history of breast cancer   . Family history of prostate cancer   . Hypertension   . Insulin resistance   . Pulmonary embolism ([place]) ~ 2012  . Radiation 02/12/12 -03/12/12   left breast, total 50gy  . Right ankle pain   . Shortness of  breath   . Venous stasis of lower extremity   . Wears dentures    upper  . Wears glasses   . Wears partial dentures    lower    Patient Active Problem List   Diagnosis Date Noted  . 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 Pancytopenia, acquired ([place]) 09/24/2018  . CKD (chronic kidney disease), stage III ([place]) 09/24/2018  . 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 Genetic testing 09/10/2018  . Anemia due to antineoplastic chemotherapy 09/01/2018  . Other constipation 09/01/2018  . Goals of care, counseling/discussion 08/06/2018  . Family history of breast cancer   . Family history of prostate cancer   . Uterine cancer ([place]) 08/02/2018  . Hematuria 05/20/2018  . Low back pain without sciatica 01/15/2018  . Cough 06/30/2017  . Weakness 04/13/2017  . Enteritis   . Other ascites   . Urinary tract infection without hematuria   . Left upper arm pain 10/14/2016  . Prediabetes 02/27/2016  . Plantar fasciitis of right foot 02/27/2014  . [place] arthritis 06/15/2013  . Pain of skin 02/18/2013  . Paresthesia of right leg 09/30/2012  . Venous stasis of lower extremity   . Preventative health care 03/05/2012  . History of left breast cancer 01/20/2012  .  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 Leg swelling 04/09/2011  . 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 STASIS DERMATITIS, CHRONIC 10/25/2009  . DEEP VENOUS THROMBOPHLEBITIS, HX OF 07/28/2008  . OBESITY 01/27/2008  . Essential hypertension 09/24/2006  . ASTHMA, PERSISTENT 09/24/2006  . DEGENERATIVE JOINT DISEASE, KNEE 09/04/2006    Past Surgical History:  Procedure Laterality Date  . BREAST BIOPSY Left 11/03/11   10 o'clock=benign breast parenchyma  . BREAST LUMPECTOMY Left 01/06/12    Dr. Erroll Luna  . IR IMAGING GUIDED PORT INSERTION  08/06/2018     OB History   No obstetric history on file.      Home Medications    Prior to Admission medications   Medication Sig Start Date End Date Taking?  Authorizing Provider  acetaminophen (TYLENOL) 500 MG tablet Take 1 tablet (500 mg total) by mouth every 6 (six) hours as needed. Patient taking differently: Take 1,000 mg by mouth every 6 (six) hours as needed for moderate pain.  08/25/12  Yes Cook, Jayce G, DO  ADVAIR DISKUS 100-50 MCG/DOSE AEPB USE 1 INHALATION BY MOUTH TWICE DAILY Patient taking differently: Inhale 1 puff into the lungs daily.  11/08/18  Yes Lovenia Kim, MD  Calcium Carbonate (CALTRATE 600 PO) Take 1 tablet by mouth 2 (two) times daily.    Yes [provider]  dexamethasone (DECADRON) 4 MG tablet Take 2 tabs the night before and 2 tabs on the morning of chemotherapy every 3 weeks with food Patient taking differently: Take 4 mg by mouth See admin instructions. Take 2 tabs the night before and 2 tabs on the morning of chemotherapy every 3 weeks with food 01/12/19  Yes Gorsuch, Ni, MD  enalapril (VASOTEC) 20 MG tablet TAKE 1 TABLET(20 MG) BY MOUTH TWICE DAILY Patient taking differently: Take 20 mg by mouth daily.  11/10/18  Yes Lovenia Kim, MD  furosemide (LASIX) 40 MG tablet TAKE 1 TABLET BY MOUTH DAILY AS NEEDED FOR LEG SWELLING Patient taking differently: Take 40 mg by mouth daily as needed (leg swelling).  11/04/18  Yes Lovenia Kim, MD  hydrochlorothiazide (HYDRODIURIL) 25 MG tablet TAKE 1 TABLET(25 MG) BY MOUTH DAILY Patient taking differently: Take 25 mg by mouth daily.  11/18/18  Yes Lovenia Kim, MD  lidocaine-prilocaine (EMLA) cream Apply to affected area once Patient taking differently: Apply 1 application topically daily as needed. Apply to affected area once 08/05/18  Yes Gorsuch, Ni, MD  Multiple Vitamin (MULTIVITAMIN) tablet Take 1 tablet by mouth daily.   Yes [provider]  Multiple Vitamins-Minerals (ICAPS AREDS 2 PO) Take 1 capsule by mouth 2 (two) times daily.   Yes [provider]  ondansetron (ZOFRAN) 8 MG tablet Take 1 tablet (8 mg total) by mouth every 8 (eight) hours as needed. Start  on the third day after chemotherapy. 08/05/18  Yes Gorsuch, Ni, MD  oxyCODONE (ROXICODONE) 5 MG immediate release tablet Take 1 tablet (5 mg total) by mouth every 6 (six) hours as needed for severe pain. 01/15/19  Yes McDonald, Mia A, PA-C  polyethylene glycol (MIRALAX / GLYCOLAX) 17 g packet Take 17 g by mouth daily as needed for moderate constipation.   Yes [provider]  Vitamin D, Cholecalciferol, 1000 units CAPS Take 1,000 Units by mouth daily.    Yes [provider]  XARELTO 20 MG TABS tablet TAKE 1 TABLET BY MOUTH DAILY Patient taking differently: Take 20 mg by mouth daily with supper.  01/14/19  Yes [name],  DO  prochlorperazine (COMPAZINE) 10 MG tablet Take 1 tablet (10 mg total) by mouth every 6 (six) hours as needed (Nausea or vomiting). Patient not taking: Reported on 02/07/2019 08/05/18   [name], MD  Simethicone 80 MG TABS Take 1 tablet (80 mg total) by mouth QID. Patient taking differently: Take 80 mg by mouth 4 (four) times daily as needed. Heart burn 12/02/18   Harle Stanford., PA-C  omeprazole (PRILOSEC) 40 MG capsule Take 1 capsule (40 mg total) by mouth daily. Patient not taking: Reported on 01/14/2019 12/02/18 01/15/19  Harle Stanford., PA-C    Family History Family History  Problem Relation Age of Onset  . Heart disease Mother   . Heart attack Mother   . Breast cancer Sister 56  . Parkinson's disease Brother   . Prostate cancer Brother   . Pulmonary embolism Daughter   . Diabetes Daughter   . Pulmonary embolism Daughter   . Breast cancer Sister   . Other Brother        MVA  . Other Brother        MVA  . COPD Brother   . Breast cancer Niece        dx 4s    Social History Social History   Tobacco Use  . Smoking status: Former Smoker    Packs/day: 0.30    Years: 15.00    Pack years: 4.50    Types: Cigarettes  . Smokeless tobacco: Never Used  . Tobacco comment: "stopped in the 1990s"  Substance Use Topics  . Alcohol use: No  . Drug  use: No     Allergies   Patient has no known allergies.   Review of Systems Review of Systems  Constitutional: Negative for chills and fever.  HENT: Negative for congestion and rhinorrhea.   Eyes: Negative for redness and visual disturbance.  Respiratory: Negative for shortness of breath and wheezing.   Cardiovascular: Negative for chest pain and palpitations.  Gastrointestinal: Positive for abdominal pain. Negative for nausea and vomiting.  Genitourinary: Negative for dysuria and urgency.  Musculoskeletal: Negative for arthralgias and myalgias.  Skin: Negative for pallor and wound.  Neurological: Negative for dizziness and headaches.     Physical Exam Updated Vital Signs BP (!) 151/67   Pulse 98   Temp 98.7 F (37.1 C) (Oral)   Resp 17   Ht 5' 6.5" (1.689 m)   Wt 115.7 kg   SpO2 100%   BMI 40.54 kg/m   Physical Exam Vitals signs and nursing Esparza reviewed.  Constitutional:      General: She is not in acute distress.    Appearance: She is well-developed. She is not diaphoretic.  HENT:     Head: Normocephalic and atraumatic.  Eyes:     Pupils: Pupils are equal, round, and reactive to light.  Neck:     Musculoskeletal: Normal range of motion and neck supple.  Cardiovascular:     Rate and Rhythm: Normal rate and regular rhythm.     Heart sounds: No murmur. No friction rub. No gallop.   Pulmonary:     Effort: Pulmonary effort is normal.     Breath sounds: No wheezing or rales.  Abdominal:     General: There is no distension.     Palpations: Abdomen is soft.     Tenderness: There is abdominal tenderness (worse to the RLQ.  No palpable mass.).  Musculoskeletal:        General: No tenderness.  Skin:  General: Skin is warm and dry.  Neurological:     Mental Status: She is alert and oriented to person, place, and time.  Psychiatric:        Behavior: Behavior normal.      ED Treatments / Results  Labs (all labs ordered are listed, but only abnormal  results are displayed) Labs Reviewed  COMPREHENSIVE METABOLIC PANEL - Abnormal; Notable for the following components:      Result Value   Glucose, Bld 118 (*)    BUN 26 (*)    Albumin 3.4 (*)    Total Bilirubin 0.2 (*)    GFR calc non Af Amer 56 (*)    All other components within normal limits  CBC - Abnormal; Notable for the following components:   RBC 2.37 (*)    Hemoglobin 8.1 (*)    HCT 24.7 (*)    MCV 104.2 (*)    MCH 34.2 (*)    RDW 17.2 (*)    Platelets 147 (*)    All other components within normal limits  URINALYSIS, ROUTINE W REFLEX MICROSCOPIC - Abnormal; Notable for the following components:   Color, Urine STRAW (*)    All other components within normal limits  LIPASE, BLOOD    EKG None  Radiology Ct Abdomen Pelvis W Contrast  Result Date: 02/07/2019 CLINICAL DATA:  Acute generalized abdominal pain. EXAM: CT ABDOMEN AND PELVIS WITH CONTRAST TECHNIQUE: Multidetector CT imaging of the abdomen and pelvis was performed using the standard protocol following bolus administration of intravenous contrast. CONTRAST:  112mL OMNIPAQUE IOHEXOL 300 MG/ML  SOLN COMPARISON:  January 14, 2019 FINDINGS: Lower chest: No acute abnormality. Hepatobiliary: Numerous less than 1 cm hypoattenuated nodules throughout the liver. Normal appearance of the gallbladder. Pancreas: Unremarkable. No pancreatic ductal dilatation or surrounding inflammatory changes. Spleen: Normal in size without focal abnormality. Adrenals/Urinary Tract: Normal adrenal glands, right kidney, ureters and urinary bladder. Stable left renal cyst. Stomach/Bowel: Stomach is within normal limits. Appendix appears normal. No evidence of bowel wall thickening, distention, or inflammatory changes. Vascular/Lymphatic: Aortic atherosclerosis. No enlarged abdominal or pelvic lymph nodes. Nonspecific shotty mesenteric and retroperitoneal lymph nodes. Reproductive: Retroverted uterus with suspected fibroid within the uterine fundus. Other:  Right inguinal hernia contains a short segment of the ileum, new finding from the prior CT. The hernia is relatively narrow neck. The small bowel loops within the hernial sac are not contrast opacified and therefore incompletely evaluated. A portion of the urinary bladder is also seen within the hernial sac, unchanged. Musculoskeletal: Spondylosis of the lumbosacral spine. IMPRESSION: 1. Known right inguinal hernia now contains a short segment of the ileum. The hernia is relatively narrow neck. The small bowel loops within the hernial sac are not contrast opacified and therefore not well evaluated. No secondary signs of strangulation. A portion of the urinary bladder is again seen within the hernial sac as before. Aortic Atherosclerosis (ICD10-I70.0). Electronically Signed   By: Fidela Salisbury M.D.   On: 02/07/2019 17:58    Procedures Hernia reduction  Date/Time: 02/07/2019 7:55 PM Performed by: Deno Etienne, DO Authorized by: Deno Etienne, DO  Consent: Verbal consent obtained. Risks and benefits: risks, benefits and alternatives were discussed Consent given by: patient (daughter) Patient understanding: patient states understanding of the procedure being performed Imaging studies: imaging studies available Patient identity confirmed: verbally with patient Time out: Immediately prior to procedure a "time out" was called to verify the correct patient, procedure, equipment, support staff and site/side marked as required. Local anesthesia  used: no  Anesthesia: Local anesthesia used: no  Sedation: Patient sedated: no  Patient tolerance: patient tolerated the procedure well with no immediate complications    (including critical care time)  Medications Ordered in ED Medications  sodium chloride (PF) 0.9 % injection (has no administration in time range)  fentaNYL (SUBLIMAZE) injection 50 mcg (has no administration in time range)  oxyCODONE (Oxy IR/ROXICODONE) immediate release tablet 5 mg  (has no administration in time range)  sodium chloride 0.9 % bolus 1,000 mL (0 mLs Intravenous Stopped 02/07/19 1800)  fentaNYL (SUBLIMAZE) injection 100 mcg (100 mcg Intravenous Given 02/07/19 1655)  ondansetron (ZOFRAN) injection 4 mg (4 mg Intravenous Given 02/07/19 1653)  iohexol (OMNIPAQUE) 300 MG/ML solution 100 mL (100 mLs Intravenous Contrast Given 02/07/19 1717)  fentaNYL (SUBLIMAZE) injection 100 mcg (100 mcg Intravenous Given 02/07/19 1835)     Initial Impression / Assessment and Plan / ED Course  I have reviewed the triage vital signs and the nursing notes.  Pertinent labs & imaging results that were available during my care of the patient were reviewed by me and considered in my medical decision making (see chart for details).        78 yo F with a chief complaint of right lower quadrant pain.  Going on for about 2-1/2 hours.  Has a history of a similar presentation.  Had a CT scan at that point that showed a stable right direct inguinal hernia.  We will repeat the CT scan today.  Blood work bolus of fluids urine reassess.  CT scan does show a right indirect inguinal hernia.  On reexam it is much more obvious.  Patient was laid back in recumbent position and after about 30 minutes of icing I was able to reduce it without much difficulty.  Patient reassessed about 30 minutes later and still is feeling well.  We will have her follow-up with general surgery in the office.  7:56 PM:  I have discussed the diagnosis/risks/treatment options with the patient and family and believe the pt to be eligible for discharge home to follow-up with Gen surgery, oncology. We also discussed returning to the ED immediately if new or worsening sx occur. We discussed the sx which are most concerning (e.g., sudden worsening pain, fever, inability to tolerate by mouth) that necessitate immediate return. Medications administered to the patient during their visit and any new prescriptions provided to the patient are  listed below.  Medications given during this visit Medications  sodium chloride (PF) 0.9 % injection (has no administration in time range)  fentaNYL (SUBLIMAZE) injection 50 mcg (has no administration in time range)  oxyCODONE (Oxy IR/ROXICODONE) immediate release tablet 5 mg (has no administration in time range)  sodium chloride 0.9 % bolus 1,000 mL (0 mLs Intravenous Stopped 02/07/19 1800)  fentaNYL (SUBLIMAZE) injection 100 mcg (100 mcg Intravenous Given 02/07/19 1655)  ondansetron (ZOFRAN) injection 4 mg (4 mg Intravenous Given 02/07/19 1653)  iohexol (OMNIPAQUE) 300 MG/ML solution 100 mL (100 mLs Intravenous Contrast Given 02/07/19 1717)  fentaNYL (SUBLIMAZE) injection 100 mcg (100 mcg Intravenous Given 02/07/19 1835)     The patient appears reasonably screen and/or stabilized for discharge and I doubt any other medical condition or other [facility] requiring further screening, evaluation, or treatment in the ED at this time prior to discharge.    Final Clinical Impressions(s) / ED Diagnoses   Final diagnoses:  Unilateral recurrent inguinal hernia without obstruction or gangrene    ED Discharge Orders    None  Deno Etienne, DO 02/07/19 1956

## 2019-02-07 NOTE — Discharge Instructions (Signed)
Follow up with the general surgeons in the office.  Let your oncologist know about your visit here today.  Try and keep your bowels soft.  No heavy lifting.  If this happens to you again please lie back flat on the bed or recliner and try to apply ice low gentle pressure.  If it is not go back into place then come to the ED for evaluation.

## 2019-02-07 NOTE — ED Notes (Signed)
Patient transported to CT 

## 2019-02-08 ENCOUNTER — Other Ambulatory Visit: Payer: Self-pay

## 2019-02-08 ENCOUNTER — Telehealth: Payer: Self-pay | Admitting: Oncology

## 2019-02-08 MED ORDER — RIVAROXABAN 20 MG PO TABS: 20.0000 mg | ORAL_TABLET | Freq: Every day | ORAL | 1 refills | Status: DC

## 2019-02-08 NOTE — Telephone Encounter (Signed)
Left a message for [place] Patient Coordinator at [facility] regarding referral for right inguinal hernia.  Requested a return call.

## 2019-02-08 NOTE — Telephone Encounter (Signed)
Called Erin Esparza and let her know that we have called [facility] to get her an appointment with a general surgeon and that we will call her back once an appointment is scheduled.  She verbalized agreement and said she hasn't had a chance to call yet.

## 2019-02-09 ENCOUNTER — Encounter: Payer: Self-pay | Admitting: Oncology

## 2019-02-09 DIAGNOSIS — K4090 Unilateral inguinal hernia, without obstruction or gangrene, not specified as recurrent: Secondary | ICD-10-CM

## 2019-02-09 NOTE — Telephone Encounter (Signed)
Left another message for Erin Esparza at [facility] regarding referral.

## 2019-02-09 NOTE — Progress Notes (Signed)
Faxed referral to [facility].

## 2019-02-10 ENCOUNTER — Telehealth: Payer: Self-pay | Admitting: Oncology

## 2019-02-10 ENCOUNTER — Other Ambulatory Visit: Payer: Self-pay | Admitting: Hematology and Oncology

## 2019-02-10 MED ORDER — OXYCODONE HCL 5 MG PO TABS: 5.0000 mg | ORAL_TABLET | Freq: Four times a day (QID) | ORAL | 0 refills | Status: DC | PRN

## 2019-02-10 NOTE — Telephone Encounter (Signed)
Left a message for Erin Esparza at [facility] regarding referral.  Requested a return call.

## 2019-02-10 NOTE — Telephone Encounter (Signed)
I will refill her prescription to local pharmacy There is no signs of cancer on recent CT scan; that is why I recommend stopping treatment so we can take care of the hernia. It is safe to hold for at least 3 months until she is fully recovered from surgery

## 2019-02-10 NOTE — Telephone Encounter (Signed)
Erin Esparza with appointment to see [name] Stage at [facility] on 02/21/19 at 11:00.    Erin Esparza asked if she could get a refill of oxycodone 5 mg since she has 2 tablets left and is worried that her hernia pain will flair up before she sees [name] Stage.  She was given the prescription by the ER doctor and she had discussed the refill with Dr. Alvy Bimler on 02/02/19.    Her daughter, Erin Esparza, also had questions.  She is wondering if Dr. Alvy Bimler can review the CT scan on 02/07/19 for signs of cancer.  Also if Erin Esparza has hernia surgery - how will it effect her care and cancer treatment.  She is worried that it will further delay chemotherapy.

## 2019-02-12 ENCOUNTER — Telehealth: Payer: Self-pay | Admitting: Family Medicine

## 2019-02-12 NOTE — Telephone Encounter (Signed)
[facility] After hours Emergency Line:  Patient's daughter called afterhours emergency line for her mother/patient Ms. Erin Esparza.   Per daughter, patient had CT scan on 9/7 that was notable for inguinal hernia that was reducable. She notes that she was given Oxycodone for pain and scheduled to see general surgeon next week. She notes the oxycodone is managing her pain well. However, she noticed that today the inguinal hernia appears to have returned and she is unsure what to do. She denies any pain. Instructed that she can attempt to reduce the hernia and as long as it is reducable and she is having no pain, then she can follow up with surgery as scheduled. Discussed return precautions including worsening pain, unable to reduce hernia, new onset fever/nausea/vomiting. Patient's daughter understood and agreed to plan.   Mina Marble, DO [facility], PGY2 02/12/2019

## 2019-02-21 ENCOUNTER — Ambulatory Visit: Payer: Self-pay | Admitting: Surgery

## 2019-02-21 DIAGNOSIS — K4090 Unilateral inguinal hernia, without obstruction or gangrene, not specified as recurrent: Secondary | ICD-10-CM | POA: Diagnosis not present

## 2019-02-21 NOTE — H&P (View-Only) (Signed)
Erin Esparza Documented: 02/21/2019 11:10 AM Location: [facility] Patient #: [id number] DOB: 12-Dec-1940 Widowed / Language: Cleophus Molt / Race: Black or African American Female  History of Present Illness Marcello Moores A. Cornett MD; 02/21/2019 12:00 PM) Patient words: Patient status at the request of Dr Alvy Bimler for right inguinal hernia. She seen 2 weeks ago in the emergency room due to incarcerated right inguinal hernia that was reduced but the pain. That improved her pain. She relates a multi-month history of right groin pain on and off. A computed tomography scan was obtained which showed part of the bladder and a loop of small bowel that were reduced in the emergency room. She denies any nausea or vomiting currently. She is undergoing chemotherapy for uterine cancer as well.  The patient is a 78 year old female.   Past Surgical History Nance Pew, CMA; 02/21/2019 11:10 AM) Breast Mass; Local Excision Left.  Diagnostic Studies History Nance Pew, CMA; 02/21/2019 11:10 AM) Colonoscopy never Mammogram within last year Pap Smear >5 years ago  Allergies Nance Pew, CMA; 02/21/2019 11:11 AM) No Known Drug Allergies [11/28/2014]: Allergies Reconciled  Medication History Nance Pew, CMA; 02/21/2019 11:11 AM) Advair Diskus (100-50MCG/DOSE Aero Pow Br Act, Inhalation) Active. Anastrozole (1MG  Tablet, Oral) Active. Enalapril Maleate (20MG  Tablet, Oral) Active. Hydrochlorothiazide (25MG  Tablet, Oral) Active. Xarelto (20MG  Tablet, Oral) Active. Tylenol Extra Strength (500MG  Tablet, Oral) Active. Calcium Carbonate Active. Multi Vitamin Daily (Oral) Active. Omeprazole (40MG  Capsule DR, Oral) Active. Medications Reconciled  Social History Nance Pew, CMA; 02/21/2019 11:10 AM) Alcohol use Remotely quit alcohol use. Caffeine use Tea. No drug use Tobacco use Former smoker.  Family History Nance Pew, [place]; 02/21/2019 11:10 AM) Cancer  Sister. Heart Disease Father, Mother.  Pregnancy / Birth History Nance Pew, [place]; 02/21/2019 11:10 AM) Age of menopause 77-55 Maternal age 84-25 Para 5  Other Problems Nance Pew, CMA; 02/21/2019 11:10 AM) Arthritis Asthma Breast Cancer Cancer High blood pressure     Review of Systems (Sabrina Canty CMA; 02/21/2019 11:10 AM) Respiratory Not Present- Bloody sputum, Chronic Cough, Difficulty Breathing, Snoring and Wheezing. Cardiovascular Not Present- Chest Pain, Difficulty Breathing Lying Down, Leg Cramps, Palpitations, Rapid Heart Rate, Shortness of Breath and Swelling of Extremities. Female Genitourinary Not Present- Frequency, Nocturia, Painful Urination, Pelvic Pain and Urgency. Neurological Not Present- Decreased Memory, Fainting, Headaches, Numbness, Seizures, Tingling, Tremor, Trouble walking and Weakness. Psychiatric Not Present- Anxiety, Bipolar, Change in Sleep Pattern, Depression, Fearful and Frequent crying. Endocrine Not Present- Cold Intolerance, Excessive Hunger, Hair Changes, Heat Intolerance, Hot flashes and New Diabetes. Hematology Present- Blood Thinners. Not Present- Easy Bruising, Excessive bleeding, Gland problems, HIV and Persistent Infections.  Vitals (Sabrina Canty CMA; 02/21/2019 11:12 AM) 02/21/2019 11:11 AM Weight: 253.13 lb Height: 66in Body Surface Area: 2.21 m Body Mass Index: 40.86 kg/m  Temp.: 27F(Temporal)  Pulse: 101 (Regular)  BP: 128/70 (Sitting, Left Arm, Standard)        Physical Exam (Thomas A. Cornett MD; 02/21/2019 12:00 PM)  General Mental Status-Alert. General Appearance-Consistent with stated age. Hydration-Well hydrated. Voice-Normal.  Head and Neck Head-normocephalic, atraumatic with no lesions or palpable masses. Trachea-midline. Thyroid Gland Characteristics - normal size and consistency.  Chest and Lung Exam Chest and lung exam reveals -quiet, even and easy respiratory  effort with no use of accessory muscles and on auscultation, normal breath sounds, no adventitious sounds and normal vocal resonance. Inspection Chest Wall - Normal. Back - normal.  Cardiovascular Cardiovascular examination reveals -normal heart sounds, regular rate and rhythm with no murmurs  and normal pedal pulses bilaterally.  Abdomen Note: Small right inguinal hernia admitted. Reducible mild discomfort with palpation no evidence of left inguinal hernia.  Neurologic Neurologic evaluation reveals -alert and oriented x 3 with no impairment of recent or remote memory. Mental Status-Normal.  Musculoskeletal Normal Exam - Left-Upper Extremity Strength Normal and Lower Extremity Strength Normal. Normal Exam - Right-Upper Extremity Strength Normal and Lower Extremity Strength Normal.    Assessment & Plan (Thomas A. Cornett MD; 02/21/2019 12:01 PM)  RIGHT INGUINAL HERNIA (K40.90) Impression: Given intermittent history of incarceration, recommend repair with mesh of her right inguinal hernia. The risk of hernia repair include bleeding, infection, organ injury, bowel injury, bladder injury, nerve injury recurrent hernia, blood clots, worsening of underlying condition, chronic pain, mesh use, open surgery, death, and the need for other operations. Pt agrees to proceed Hold anticoagulation 48 hours prior to surgery.  Current Plans You are being scheduled for surgery- Our schedulers will call you.  You should hear from our office's scheduling department within 5 working days about the location, date, and time of surgery. We try to make accommodations for patient's preferences in scheduling surgery, but sometimes the OR schedule or the surgeon's schedule prevents [place] from making those accommodations.  If you have not heard from our office [phone]) in 5 working days, call the office and ask for your surgeon's nurse.  If you have other questions about your diagnosis, plan, or  surgery, call the office and ask for your surgeon's nurse.  Pt Education - Pamphlet Given - Hernia Surgery: discussed with patient and provided information. The anatomy & physiology of the abdominal wall and pelvic floor was discussed. The pathophysiology of hernias in the inguinal and pelvic region was discussed. Natural history risks such as progressive enlargement, pain, incarceration, and strangulation was discussed. Contributors to complications such as smoking, obesity, diabetes, prior surgery, etc were discussed.  I feel the risks of no intervention will lead to serious problems that outweigh the operative risks; therefore, I recommended surgery to reduce and repair the hernia. I explained an open approach. I noted usual use of mesh to patch and/or buttress hernia repair  Risks such as bleeding, infection, abscess, need for further treatment, heart attack, death, and other risks were discussed. I noted a good likelihood this will help address the problem. Goals of post-operative recovery were discussed as well. Possibility that this will not correct all symptoms was explained. I stressed the importance of low-impact activity, aggressive pain control, avoiding constipation, & not pushing through pain to minimize risk of post-operative chronic pain or injury. Possibility of reherniation was discussed. We will work to minimize complications.  An educational handout further explaining the pathology & treatment options was given as well. Questions were answered. The patient expresses understanding & wishes to proceed with surgery.

## 2019-02-21 NOTE — H&P (Signed)
[name] Documented: 02/21/2019 11:10 AM Location: [facility] Patient #: [id number] DOB: 1941-03-27 Widowed / Language: Cleophus Molt / Race: Black or African American Female  History of Present Illness Marcello Moores A. Cornett MD; 02/21/2019 12:00 PM) Patient words: Patient status at the request of Dr Alvy Bimler for right inguinal hernia. She seen 2 weeks ago in the emergency room due to incarcerated right inguinal hernia that was reduced but the pain. That improved her pain. She relates a multi-month history of right groin pain on and off. A computed tomography scan was obtained which showed part of the bladder and a loop of small bowel that were reduced in the emergency room. She denies any nausea or vomiting currently. She is undergoing chemotherapy for uterine cancer as well.  The patient is a 78 year old female.   Past Surgical History Nance Pew, CMA; 02/21/2019 11:10 AM) Breast Mass; Local Excision Left.  Diagnostic Studies History Nance Pew, CMA; 02/21/2019 11:10 AM) Colonoscopy never Mammogram within last year Pap Smear >5 years ago  Allergies Nance Pew, CMA; 02/21/2019 11:11 AM) No Known Drug Allergies [11/28/2014]: Allergies Reconciled  Medication History Nance Pew, CMA; 02/21/2019 11:11 AM) Advair Diskus (100-50MCG/DOSE Aero Pow Br Act, Inhalation) Active. Anastrozole (1MG  Tablet, Oral) Active. Enalapril Maleate (20MG  Tablet, Oral) Active. Hydrochlorothiazide (25MG  Tablet, Oral) Active. Xarelto (20MG  Tablet, Oral) Active. Tylenol Extra Strength (500MG  Tablet, Oral) Active. Calcium Carbonate Active. Multi Vitamin Daily (Oral) Active. Omeprazole (40MG  Capsule DR, Oral) Active. Medications Reconciled  Social History Nance Pew, CMA; 02/21/2019 11:10 AM) Alcohol use Remotely quit alcohol use. Caffeine use Tea. No drug use Tobacco use Former smoker.  Family History Nance Pew, [place]; 02/21/2019 11:10 AM) Cancer  Sister. Heart Disease Father, Mother.  Pregnancy / Birth History Nance Pew, [place]; 02/21/2019 11:10 AM) Age of menopause 64-55 Maternal age 27-25 Para 5  Other Problems Nance Pew, CMA; 02/21/2019 11:10 AM) Arthritis Asthma Breast Cancer Cancer High blood pressure     Review of Systems (Sabrina Canty CMA; 02/21/2019 11:10 AM) Respiratory Not Present- Bloody sputum, Chronic Cough, Difficulty Breathing, Snoring and Wheezing. Cardiovascular Not Present- Chest Pain, Difficulty Breathing Lying Down, Leg Cramps, Palpitations, Rapid Heart Rate, Shortness of Breath and Swelling of Extremities. Female Genitourinary Not Present- Frequency, Nocturia, Painful Urination, Pelvic Pain and Urgency. Neurological Not Present- Decreased Memory, Fainting, Headaches, Numbness, Seizures, Tingling, Tremor, Trouble walking and Weakness. Psychiatric Not Present- Anxiety, Bipolar, Change in Sleep Pattern, Depression, Fearful and Frequent crying. Endocrine Not Present- Cold Intolerance, Excessive Hunger, Hair Changes, Heat Intolerance, Hot flashes and New Diabetes. Hematology Present- Blood Thinners. Not Present- Easy Bruising, Excessive bleeding, Gland problems, HIV and Persistent Infections.  Vitals (Sabrina Canty CMA; 02/21/2019 11:12 AM) 02/21/2019 11:11 AM Weight: 253.13 lb Height: 66in Body Surface Area: 2.21 m Body Mass Index: 40.86 kg/m  Temp.: 78F(Temporal)  Pulse: 101 (Regular)  BP: 128/70 (Sitting, Left Arm, Standard)        Physical Exam (Thomas A. Cornett MD; 02/21/2019 12:00 PM)  General Mental Status-Alert. General Appearance-Consistent with stated age. Hydration-Well hydrated. Voice-Normal.  Head and Neck Head-normocephalic, atraumatic with no lesions or palpable masses. Trachea-midline. Thyroid Gland Characteristics - normal size and consistency.  Chest and Lung Exam Chest and lung exam reveals -quiet, even and easy respiratory  effort with no use of accessory muscles and on auscultation, normal breath sounds, no adventitious sounds and normal vocal resonance. Inspection Chest Wall - Normal. Back - normal.  Cardiovascular Cardiovascular examination reveals -normal heart sounds, regular rate and rhythm with no murmurs  and normal pedal pulses bilaterally.  Abdomen Note: Small right inguinal hernia admitted. Reducible mild discomfort with palpation no evidence of left inguinal hernia.  Neurologic Neurologic evaluation reveals -alert and oriented x 3 with no impairment of recent or remote memory. Mental Status-Normal.  Musculoskeletal Normal Exam - Left-Upper Extremity Strength Normal and Lower Extremity Strength Normal. Normal Exam - Right-Upper Extremity Strength Normal and Lower Extremity Strength Normal.    Assessment & Plan (Thomas A. Cornett MD; 02/21/2019 12:01 PM)  RIGHT INGUINAL HERNIA (K40.90) Impression: Given intermittent history of incarceration, recommend repair with mesh of her right inguinal hernia. The risk of hernia repair include bleeding, infection, organ injury, bowel injury, bladder injury, nerve injury recurrent hernia, blood clots, worsening of underlying condition, chronic pain, mesh use, open surgery, death, and the need for other operations. Pt agrees to proceed Hold anticoagulation 48 hours prior to surgery.  Current Plans You are being scheduled for surgery- Our schedulers will call you.  You should hear from our office's scheduling department within 5 working days about the location, date, and time of surgery. We try to make accommodations for patient's preferences in scheduling surgery, but sometimes the OR schedule or the surgeon's schedule prevents [place] from making those accommodations.  If you have not heard from our office [phone]) in 5 working days, call the office and ask for your surgeon's nurse.  If you have other questions about your diagnosis, plan, or  surgery, call the office and ask for your surgeon's nurse.  Pt Education - Pamphlet Given - Hernia Surgery: discussed with patient and provided information. The anatomy & physiology of the abdominal wall and pelvic floor was discussed. The pathophysiology of hernias in the inguinal and pelvic region was discussed. Natural history risks such as progressive enlargement, pain, incarceration, and strangulation was discussed. Contributors to complications such as smoking, obesity, diabetes, prior surgery, etc were discussed.  I feel the risks of no intervention will lead to serious problems that outweigh the operative risks; therefore, I recommended surgery to reduce and repair the hernia. I explained an open approach. I noted usual use of mesh to patch and/or buttress hernia repair  Risks such as bleeding, infection, abscess, need for further treatment, heart attack, death, and other risks were discussed. I noted a good likelihood this will help address the problem. Goals of post-operative recovery were discussed as well. Possibility that this will not correct all symptoms was explained. I stressed the importance of low-impact activity, aggressive pain control, avoiding constipation, & not pushing through pain to minimize risk of post-operative chronic pain or injury. Possibility of reherniation was discussed. We will work to minimize complications.  An educational handout further explaining the pathology & treatment options was given as well. Questions were answered. The patient expresses understanding & wishes to proceed with surgery.

## 2019-02-23 ENCOUNTER — Telehealth: Payer: Self-pay | Admitting: Family Medicine

## 2019-02-23 NOTE — Telephone Encounter (Signed)
Noted surgical clearance and discontinuation of Xarelto request via fax for prior to upcoming abdominal hernia repair. Patient is at high bleeding risk given active cancer. On Xarelto for previous DVT/PE. Additionally, patient has not been seen within our clinic since 05/2018.   Please call the patient and schedule an appointment to further discuss surgery etc at her earliest convenience.    [name], DO

## 2019-02-23 NOTE — Telephone Encounter (Signed)
Called patient and set up appointment on 03/09/2019 at 0830.  Patient says that she has a hernia which needs to be removed and she is anxious about this. She had surgery a few years back and was on Xarelto at the time.  Erin Esparza, [place]

## 2019-03-03 ENCOUNTER — Other Ambulatory Visit: Payer: Self-pay

## 2019-03-03 ENCOUNTER — Ambulatory Visit (INDEPENDENT_AMBULATORY_CARE_PROVIDER_SITE_OTHER): Payer: Medicare Other | Admitting: Family Medicine

## 2019-03-03 ENCOUNTER — Encounter: Payer: Self-pay | Admitting: Family Medicine

## 2019-03-03 VITALS — BP 110/58 | HR 95 | Wt 255.2 lb

## 2019-03-03 DIAGNOSIS — Z01818 Encounter for other preprocedural examination: Secondary | ICD-10-CM

## 2019-03-03 DIAGNOSIS — Z23 Encounter for immunization: Secondary | ICD-10-CM

## 2019-03-03 NOTE — Patient Instructions (Signed)
It was a pleasure to see you today! Thank you for choosing [facility] for your primary care. Erin Esparza was seen for surgical assessment. Come back to the clinic if there is anything else we can do for you.  Today we did a surgical risk assessment for you, we think that there are some risks with your blood thinner and cancer history and we prepared an assessment to allow your surgeon to review.  Would like to bring in your oncologist to discuss the timing of stopping your blood thinner before and after surgery.  We will include them in this information and let your surgeon know as well.    Please bring all your medications to every doctors visit   Sign up for My Chart to have easy access to your labs results, and communication with your Primary care physician.     Please check-out at the front desk before leaving the clinic.     Best,  Dr. Sherene Sires FAMILY MEDICINE RESIDENT - PGY3 03/03/2019 11:28 AM

## 2019-03-03 NOTE — Progress Notes (Addendum)
 *  At patient's request, daughter was on speaker phone during this encounter.* Subjective:  Erin Esparza is a 78 y.o. female who presents to the [facility] today with a chief complaint of surgery clearance.   HPI: Patient Name: Erin Esparza Date of Birth: May 16, 1941 Date of Visit: 03/04/19 PCP: [name], DO  Chief Complaint: preoperative evaluation for inguinal hernia repair Surgeon: [facility] surgery Surgery: Inguinal hernia repair Date of Procedure: To be determined  Subjective: Erin Esparza is a pleasant 78 y.o. with medical history significant for recurring inguinal hernia with multiple emergency department visits.  Presenting today for surgical clearance.  I explained to the patient and her daughter that we do not do surgical "clearance ".  But that we can do a risk assessment to help with the surgeon and their team make a more informed decision about the appropriateness of surgery.  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  I have reviewed the patient's medical, surgical,  family, and social history as appropriate.   Vitals:   03/03/19 1047  BP: (!) 110/58  Pulse: 95  SpO2: 99%   Filed Weights   03/03/19 1047  Weight: 255 lb 3.2 oz (115.8 kg)   Physical Exam  Constitutional: She is oriented to person, place, and time and well-developed, well-nourished, and in no distress. No distress.  HENT:  Head: Atraumatic.  Mouth/Throat: No oropharyngeal exudate.  Eyes: EOM are normal.  Neck: Normal range of motion. No tracheal deviation present.  Cardiovascular: Normal rate and regular rhythm.  No murmur heard. Pulmonary/Chest: Effort normal. No stridor. No respiratory distress. She exhibits no tenderness.  Abdominal: Soft. Bowel sounds are normal. She exhibits mass. There is no abdominal tenderness. There is no rebound and no guarding.  Hernia not painful at the time.  Musculoskeletal:        General: No deformity or edema.  Neurological: She is alert and oriented to person, place, and time. No cranial nerve deficit. Coordination normal. GCS score is 15.  Patient walks with assistive device.  Skin: Skin is warm. No rash noted. She is diaphoretic. No erythema. No pallor.  Psychiatric: Mood, memory, affect and judgment normal.    Lyndel Safe score:  0.7%, ACS surgical risk calculator used and shows a slightly increased risk (will be scanned to chart and faxed to surgeon) Patient is an increased risk for abdominal surgery.  In order to be medically optimized, I believe it would be appropriate to have oncology weigh in on the timing of stopping and starting anticoagulation in the utility of a bridge.    Sherene Sires, DO FAMILY MEDICINE RESIDENT - PGY3 03/04/2019 8:06 AM

## 2019-03-04 DIAGNOSIS — Z01818 Encounter for other preprocedural examination: Secondary | ICD-10-CM | POA: Insufficient documentation

## 2019-03-04 DIAGNOSIS — Z23 Encounter for immunization: Secondary | ICD-10-CM | POA: Insufficient documentation

## 2019-03-04 NOTE — Progress Notes (Signed)
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 -Dr. Criss Rosales

## 2019-03-04 NOTE — Assessment & Plan Note (Signed)
Patient consents to flu shot 

## 2019-03-08 ENCOUNTER — Other Ambulatory Visit: Payer: Self-pay

## 2019-03-08 MED ORDER — FLUTICASONE-SALMETEROL 100-50 MCG/DOSE IN AEPB: INHALATION_SPRAY | RESPIRATORY_TRACT | 2 refills | Status: DC

## 2019-03-09 ENCOUNTER — Ambulatory Visit: Payer: Medicare Other | Admitting: Family Medicine

## 2019-03-10 ENCOUNTER — Encounter: Payer: Self-pay | Admitting: Gynecology

## 2019-03-15 ENCOUNTER — Encounter (HOSPITAL_BASED_OUTPATIENT_CLINIC_OR_DEPARTMENT_OTHER): Payer: Self-pay

## 2019-03-15 ENCOUNTER — Other Ambulatory Visit: Payer: Self-pay

## 2019-03-15 NOTE — Progress Notes (Signed)
Chart and most recent EKG (01/17/19 [facility]) reviewed with Dr. Ambrose Pancoast. He is requesting for the patient to have cardiac clearance. Left message with Hassan Rowan at [facility] requesting clearance.

## 2019-03-18 ENCOUNTER — Other Ambulatory Visit (HOSPITAL_COMMUNITY)
Admission: RE | Admit: 2019-03-18 | Discharge: 2019-03-18 | Disposition: A | Discharge status: Home / Self Care | Payer: Medicare Other | Source: Ambulatory Visit | Attending: Surgery | Admitting: Surgery

## 2019-03-18 ENCOUNTER — Other Ambulatory Visit: Payer: Self-pay

## 2019-03-18 ENCOUNTER — Encounter (HOSPITAL_BASED_OUTPATIENT_CLINIC_OR_DEPARTMENT_OTHER)
Admission: RE | Admit: 2019-03-18 | Discharge: 2019-03-18 | Disposition: A | Discharge status: Home / Self Care | Payer: Medicare Other | Source: Ambulatory Visit | Attending: Surgery | Admitting: Surgery

## 2019-03-18 DIAGNOSIS — Z20828 Contact with and (suspected) exposure to other viral communicable diseases: Secondary | ICD-10-CM | POA: Insufficient documentation

## 2019-03-18 DIAGNOSIS — K4090 Unilateral inguinal hernia, without obstruction or gangrene, not specified as recurrent: Secondary | ICD-10-CM | POA: Diagnosis not present

## 2019-03-18 DIAGNOSIS — Z01812 Encounter for preprocedural laboratory examination: Secondary | ICD-10-CM | POA: Diagnosis not present

## 2019-03-18 LAB — COMPREHENSIVE METABOLIC PANEL
ALT: 12 U/L (ref 0–44)
AST: 18 U/L (ref 15–41)
Albumin: 3.4 g/dL — ABNORMAL LOW (ref 3.5–5.0)
Alkaline Phosphatase: 64 U/L (ref 38–126)
Anion gap: 9 (ref 5–15)
BUN: 23 mg/dL (ref 8–23)
CO2: 27 mmol/L (ref 22–32)
Calcium: 10.1 mg/dL (ref 8.9–10.3)
Chloride: 101 mmol/L (ref 98–111)
Creatinine, Ser: 1.23 mg/dL — ABNORMAL HIGH (ref 0.44–1.00)
GFR calc Af Amer: 48 mL/min — ABNORMAL LOW (ref >60)
GFR calc non Af Amer: 42 mL/min — ABNORMAL LOW (ref >60)
Glucose, Bld: 96 mg/dL (ref 70–99)
Potassium: 4.5 mmol/L (ref 3.5–5.1)
Sodium: 137 mmol/L (ref 135–145)
Total Bilirubin: 0.4 mg/dL (ref 0.3–1.2)
Total Protein: 7.2 g/dL (ref 6.5–8.1)

## 2019-03-18 LAB — CBC WITH DIFFERENTIAL/PLATELET
Abs Immature Granulocytes: 0.04 10*3/uL (ref 0.00–0.07)
Basophils Absolute: 0.1 10*3/uL (ref 0.0–0.1)
Basophils Relative: 1 %
Eosinophils Absolute: 0.2 10*3/uL (ref 0.0–0.5)
Eosinophils Relative: 3 %
HCT: 28.5 % — ABNORMAL LOW (ref 36.0–46.0)
Hemoglobin: 9.2 g/dL — ABNORMAL LOW (ref 12.0–15.0)
Immature Granulocytes: 1 %
Lymphocytes Relative: 13 %
Lymphs Abs: 1.1 10*3/uL (ref 0.7–4.0)
MCH: 34.1 pg — ABNORMAL HIGH (ref 26.0–34.0)
MCHC: 32.3 g/dL (ref 30.0–36.0)
MCV: 105.6 fL — ABNORMAL HIGH (ref 80.0–100.0)
Monocytes Absolute: 0.9 10*3/uL (ref 0.1–1.0)
Monocytes Relative: 11 %
Neutro Abs: 5.9 10*3/uL (ref 1.7–7.7)
Neutrophils Relative %: 71 %
Platelets: 222 10*3/uL (ref 150–400)
RBC: 2.7 MIL/uL — ABNORMAL LOW (ref 3.87–5.11)
RDW: 14 % (ref 11.5–15.5)
WBC: 8.2 10*3/uL (ref 4.0–10.5)
nRBC: 0 % (ref 0.0–0.2)

## 2019-03-18 NOTE — Progress Notes (Signed)

## 2019-03-19 LAB — NOVEL CORONAVIRUS, NAA (HOSP ORDER, SEND-OUT TO REF LAB; TAT 18-24 HRS): SARS-CoV-2, NAA: NOT DETECTED

## 2019-03-22 ENCOUNTER — Ambulatory Visit (HOSPITAL_BASED_OUTPATIENT_CLINIC_OR_DEPARTMENT_OTHER): Payer: Medicare Other | Admitting: Anesthesiology

## 2019-03-22 ENCOUNTER — Other Ambulatory Visit: Payer: Self-pay

## 2019-03-22 ENCOUNTER — Encounter: Payer: Self-pay | Admitting: Hematology and Oncology

## 2019-03-22 ENCOUNTER — Encounter (HOSPITAL_BASED_OUTPATIENT_CLINIC_OR_DEPARTMENT_OTHER)
Admission: RE | Disposition: A | Discharge status: Home / Self Care | Payer: Self-pay | Source: Home / Self Care | Attending: Surgery

## 2019-03-22 ENCOUNTER — Other Ambulatory Visit: Payer: Self-pay | Admitting: Hematology and Oncology

## 2019-03-22 ENCOUNTER — Ambulatory Visit (HOSPITAL_BASED_OUTPATIENT_CLINIC_OR_DEPARTMENT_OTHER)
Admission: RE | Admit: 2019-03-22 | Discharge: 2019-03-22 | Disposition: A | Discharge status: Home / Self Care | Payer: Medicare Other | Attending: Surgery | Admitting: Surgery

## 2019-03-22 DIAGNOSIS — Z853 Personal history of malignant neoplasm of breast: Secondary | ICD-10-CM | POA: Diagnosis not present

## 2019-03-22 DIAGNOSIS — Z86718 Personal history of other venous thrombosis and embolism: Secondary | ICD-10-CM | POA: Insufficient documentation

## 2019-03-22 DIAGNOSIS — Z7901 Long term (current) use of anticoagulants: Secondary | ICD-10-CM | POA: Diagnosis not present

## 2019-03-22 DIAGNOSIS — G8918 Other acute postprocedural pain: Secondary | ICD-10-CM | POA: Diagnosis not present

## 2019-03-22 DIAGNOSIS — Z8249 Family history of ischemic heart disease and other diseases of the circulatory system: Secondary | ICD-10-CM | POA: Diagnosis not present

## 2019-03-22 DIAGNOSIS — I110 Hypertensive heart disease with heart failure: Secondary | ICD-10-CM | POA: Insufficient documentation

## 2019-03-22 DIAGNOSIS — K4030 Unilateral inguinal hernia, with obstruction, without gangrene, not specified as recurrent: Secondary | ICD-10-CM | POA: Insufficient documentation

## 2019-03-22 DIAGNOSIS — D539 Nutritional anemia, unspecified: Secondary | ICD-10-CM | POA: Insufficient documentation

## 2019-03-22 DIAGNOSIS — Z7951 Long term (current) use of inhaled steroids: Secondary | ICD-10-CM | POA: Diagnosis not present

## 2019-03-22 DIAGNOSIS — M1990 Unspecified osteoarthritis, unspecified site: Secondary | ICD-10-CM | POA: Diagnosis not present

## 2019-03-22 DIAGNOSIS — Z79899 Other long term (current) drug therapy: Secondary | ICD-10-CM | POA: Diagnosis not present

## 2019-03-22 DIAGNOSIS — I509 Heart failure, unspecified: Secondary | ICD-10-CM | POA: Insufficient documentation

## 2019-03-22 DIAGNOSIS — Z87891 Personal history of nicotine dependence: Secondary | ICD-10-CM | POA: Insufficient documentation

## 2019-03-22 DIAGNOSIS — Z809 Family history of malignant neoplasm, unspecified: Secondary | ICD-10-CM | POA: Insufficient documentation

## 2019-03-22 DIAGNOSIS — M1710 Unilateral primary osteoarthritis, unspecified knee: Secondary | ICD-10-CM | POA: Diagnosis not present

## 2019-03-22 DIAGNOSIS — D649 Anemia, unspecified: Secondary | ICD-10-CM | POA: Insufficient documentation

## 2019-03-22 DIAGNOSIS — J4530 Mild persistent asthma, uncomplicated: Secondary | ICD-10-CM | POA: Diagnosis not present

## 2019-03-22 DIAGNOSIS — Z6839 Body mass index (BMI) 39.0-39.9, adult: Secondary | ICD-10-CM | POA: Diagnosis not present

## 2019-03-22 DIAGNOSIS — J45909 Unspecified asthma, uncomplicated: Secondary | ICD-10-CM | POA: Insufficient documentation

## 2019-03-22 HISTORY — PX: INGUINAL HERNIA REPAIR: SHX194

## 2019-03-22 SURGERY — REPAIR, HERNIA, INGUINAL, ADULT
Anesthesia: General | Site: Groin | Laterality: Right

## 2019-03-22 MED ORDER — DEXAMETHASONE SODIUM PHOSPHATE 10 MG/ML IJ SOLN
INTRAMUSCULAR | Status: DC | PRN
  Administered 2019-03-22: 10 mg via INTRAVENOUS

## 2019-03-22 MED ORDER — CHLORHEXIDINE GLUCONATE CLOTH 2 % EX PADS: 6.0000 | MEDICATED_PAD | Freq: Once | CUTANEOUS | Status: DC

## 2019-03-22 MED ORDER — ALBUTEROL SULFATE (2.5 MG/3ML) 0.083% IN NEBU
INHALATION_SOLUTION | RESPIRATORY_TRACT | Status: AC
  Filled 2019-03-22: qty 3

## 2019-03-22 MED ORDER — ROCURONIUM BROMIDE 100 MG/10ML IV SOLN
INTRAVENOUS | Status: DC | PRN
  Administered 2019-03-22: 50 mg via INTRAVENOUS

## 2019-03-22 MED ORDER — MIDAZOLAM HCL 2 MG/2ML IJ SOLN
1.0000 mg | INTRAMUSCULAR | Status: DC | PRN
  Administered 2019-03-22: 1 mg via INTRAVENOUS

## 2019-03-22 MED ORDER — PHENYLEPHRINE 40 MCG/ML (10ML) SYRINGE FOR IV PUSH (FOR BLOOD PRESSURE SUPPORT)
PREFILLED_SYRINGE | INTRAVENOUS | Status: AC
  Filled 2019-03-22: qty 10

## 2019-03-22 MED ORDER — PROPOFOL 10 MG/ML IV BOLUS
INTRAVENOUS | Status: AC
  Filled 2019-03-22: qty 20

## 2019-03-22 MED ORDER — FENTANYL CITRATE (PF) 100 MCG/2ML IJ SOLN: 25.0000 ug | INTRAMUSCULAR | Status: DC | PRN

## 2019-03-22 MED ORDER — METOCLOPRAMIDE HCL 5 MG/ML IJ SOLN: 10.0000 mg | Freq: Once | INTRAMUSCULAR | Status: DC | PRN

## 2019-03-22 MED ORDER — LACTATED RINGERS IV SOLN
INTRAVENOUS | Status: DC
  Administered 2019-03-22: 11:00:00 via INTRAVENOUS

## 2019-03-22 MED ORDER — ONDANSETRON HCL 4 MG/2ML IJ SOLN
INTRAMUSCULAR | Status: AC
  Filled 2019-03-22: qty 2

## 2019-03-22 MED ORDER — BUPIVACAINE-EPINEPHRINE 0.25% -1:200000 IJ SOLN
INTRAMUSCULAR | Status: DC | PRN
  Administered 2019-03-22: 8 mL

## 2019-03-22 MED ORDER — FENTANYL CITRATE (PF) 100 MCG/2ML IJ SOLN
INTRAMUSCULAR | Status: DC | PRN
  Administered 2019-03-22 (×3): 50 ug via INTRAVENOUS

## 2019-03-22 MED ORDER — LIDOCAINE 2% (20 MG/ML) 5 ML SYRINGE
INTRAMUSCULAR | Status: AC
  Filled 2019-03-22: qty 5

## 2019-03-22 MED ORDER — DEXAMETHASONE SODIUM PHOSPHATE 10 MG/ML IJ SOLN
INTRAMUSCULAR | Status: AC
  Filled 2019-03-22: qty 1

## 2019-03-22 MED ORDER — GABAPENTIN 300 MG PO CAPS
300.0000 mg | ORAL_CAPSULE | ORAL | Status: AC
  Administered 2019-03-22: 300 mg via ORAL

## 2019-03-22 MED ORDER — BUPIVACAINE HCL (PF) 0.5 % IJ SOLN
INTRAMUSCULAR | Status: DC | PRN
  Administered 2019-03-22: 15 mL via PERINEURAL

## 2019-03-22 MED ORDER — ALBUTEROL SULFATE (2.5 MG/3ML) 0.083% IN NEBU
2.5000 mg | INHALATION_SOLUTION | Freq: Four times a day (QID) | RESPIRATORY_TRACT | Status: DC | PRN
  Administered 2019-03-22: 2.5 mg via RESPIRATORY_TRACT

## 2019-03-22 MED ORDER — CEFAZOLIN SODIUM-DEXTROSE 2-4 GM/100ML-% IV SOLN
INTRAVENOUS | Status: AC
  Filled 2019-03-22: qty 100

## 2019-03-22 MED ORDER — BUPIVACAINE LIPOSOME 1.3 % IJ SUSP
INTRAMUSCULAR | Status: DC | PRN
  Administered 2019-03-22: 10 mL

## 2019-03-22 MED ORDER — ACETAMINOPHEN 500 MG PO TABS
ORAL_TABLET | ORAL | Status: AC
  Filled 2019-03-22: qty 2

## 2019-03-22 MED ORDER — GABAPENTIN 300 MG PO CAPS
ORAL_CAPSULE | ORAL | Status: AC
  Filled 2019-03-22: qty 1

## 2019-03-22 MED ORDER — ROCURONIUM BROMIDE 10 MG/ML (PF) SYRINGE
PREFILLED_SYRINGE | INTRAVENOUS | Status: AC
  Filled 2019-03-22: qty 10

## 2019-03-22 MED ORDER — DEXTROSE 5 % IV SOLN
3.0000 g | INTRAVENOUS | Status: AC
  Administered 2019-03-22: 2 g via INTRAVENOUS

## 2019-03-22 MED ORDER — PROPOFOL 10 MG/ML IV BOLUS
INTRAVENOUS | Status: DC | PRN
  Administered 2019-03-22: 150 mg via INTRAVENOUS

## 2019-03-22 MED ORDER — MEPERIDINE HCL 25 MG/ML IJ SOLN: 6.2500 mg | INTRAMUSCULAR | Status: DC | PRN

## 2019-03-22 MED ORDER — FENTANYL CITRATE (PF) 100 MCG/2ML IJ SOLN
INTRAMUSCULAR | Status: AC
  Filled 2019-03-22: qty 2

## 2019-03-22 MED ORDER — OXYCODONE HCL 5 MG PO TABS: 5.0000 mg | ORAL_TABLET | Freq: Four times a day (QID) | ORAL | 0 refills | Status: DC | PRN

## 2019-03-22 MED ORDER — SUGAMMADEX SODIUM 200 MG/2ML IV SOLN
INTRAVENOUS | Status: DC | PRN
  Administered 2019-03-22: 200 mg via INTRAVENOUS

## 2019-03-22 MED ORDER — FENTANYL CITRATE (PF) 100 MCG/2ML IJ SOLN
50.0000 ug | INTRAMUSCULAR | Status: DC | PRN
  Administered 2019-03-22: 100 ug via INTRAVENOUS

## 2019-03-22 MED ORDER — PHENYLEPHRINE 40 MCG/ML (10ML) SYRINGE FOR IV PUSH (FOR BLOOD PRESSURE SUPPORT)
PREFILLED_SYRINGE | INTRAVENOUS | Status: DC | PRN
  Administered 2019-03-22: 80 ug via INTRAVENOUS

## 2019-03-22 MED ORDER — ACETAMINOPHEN 500 MG PO TABS
1000.0000 mg | ORAL_TABLET | ORAL | Status: AC
  Administered 2019-03-22: 11:00:00 1000 mg via ORAL

## 2019-03-22 MED ORDER — MIDAZOLAM HCL 2 MG/2ML IJ SOLN
INTRAMUSCULAR | Status: AC
  Filled 2019-03-22: qty 2

## 2019-03-22 MED ORDER — LIDOCAINE 2% (20 MG/ML) 5 ML SYRINGE
INTRAMUSCULAR | Status: DC | PRN
  Administered 2019-03-22: 80 mg via INTRAVENOUS

## 2019-03-22 MED ORDER — ONDANSETRON HCL 4 MG/2ML IJ SOLN
INTRAMUSCULAR | Status: DC | PRN
  Administered 2019-03-22: 4 mg via INTRAVENOUS

## 2019-03-22 SURGICAL SUPPLY — 48 items
ADH SKN CLS APL DERMABOND .7 (GAUZE/BANDAGES/DRESSINGS) ×1
APL PRP STRL LF DISP 70% ISPRP (MISCELLANEOUS) ×1
BLADE CLIPPER SURG (BLADE) ×1 IMPLANT
BLADE SURG 15 STRL LF DISP TIS (BLADE) ×1 IMPLANT
BLADE SURG 15 STRL SS (BLADE) ×2
CHLORAPREP W/TINT 26 (MISCELLANEOUS) ×2 IMPLANT
COVER BACK TABLE REUSABLE LG (DRAPES) ×2 IMPLANT
COVER MAYO STAND REUSABLE (DRAPES) ×2 IMPLANT
DERMABOND ADVANCED (GAUZE/BANDAGES/DRESSINGS) ×1
DERMABOND ADVANCED .7 DNX12 (GAUZE/BANDAGES/DRESSINGS) ×1 IMPLANT
DRAIN PENROSE 1/2X12 LTX STRL (WOUND CARE) ×1 IMPLANT
DRAPE LAPAROTOMY TRNSV 102X78 (DRAPES) ×2 IMPLANT
DRAPE UTILITY XL STRL (DRAPES) ×2 IMPLANT
ELECT COATED BLADE 2.86 ST (ELECTRODE) ×2 IMPLANT
ELECT REM PT RETURN 9FT ADLT (ELECTROSURGICAL) ×2
ELECTRODE REM PT RTRN 9FT ADLT (ELECTROSURGICAL) ×1 IMPLANT
GAUZE SPONGE 4X4 12PLY STRL LF (GAUZE/BANDAGES/DRESSINGS) IMPLANT
GLOVE BIO SURGEON STRL SZ 6.5 (GLOVE) ×2 IMPLANT
GLOVE BIOGEL PI IND STRL 7.0 (GLOVE) IMPLANT
GLOVE BIOGEL PI IND STRL 8 (GLOVE) ×1 IMPLANT
GLOVE BIOGEL PI INDICATOR 7.0 (GLOVE) ×3
GLOVE BIOGEL PI INDICATOR 8 (GLOVE) ×1
GLOVE ECLIPSE 8.0 STRL XLNG CF (GLOVE) ×2 IMPLANT
GOWN STRL REUS W/ TWL LRG LVL3 (GOWN DISPOSABLE) ×2 IMPLANT
GOWN STRL REUS W/TWL LRG LVL3 (GOWN DISPOSABLE) ×6
MESH HERNIA SYS ULTRAPRO LRG (Mesh General) ×1 IMPLANT
NDL HYPO 25X1 1.5 SAFETY (NEEDLE) ×1 IMPLANT
NEEDLE HYPO 25X1 1.5 SAFETY (NEEDLE) ×2 IMPLANT
NS IRRIG 1000ML POUR BTL (IV SOLUTION) ×1 IMPLANT
PACK BASIN DAY SURGERY FS (CUSTOM PROCEDURE TRAY) ×2 IMPLANT
PENCIL BUTTON HOLSTER BLD 10FT (ELECTRODE) ×2 IMPLANT
SLEEVE SCD COMPRESS KNEE MED (MISCELLANEOUS) ×2 IMPLANT
SPONGE LAP 4X18 RFD (DISPOSABLE) ×2 IMPLANT
STRIP CLOSURE SKIN 1/2X4 (GAUZE/BANDAGES/DRESSINGS) IMPLANT
SUT MON AB 4-0 PC3 18 (SUTURE) ×2 IMPLANT
SUT NOVA 0 T19/GS 22DT (SUTURE) IMPLANT
SUT NOVA NAB DX-16 0-1 5-0 T12 (SUTURE) ×4 IMPLANT
SUT VIC AB 2-0 SH 27 (SUTURE) ×2
SUT VIC AB 2-0 SH 27XBRD (SUTURE) ×1 IMPLANT
SUT VIC AB 3-0 54X BRD REEL (SUTURE) IMPLANT
SUT VIC AB 3-0 BRD 54 (SUTURE)
SUT VICRYL 3-0 CR8 SH (SUTURE) ×2 IMPLANT
SUT VICRYL AB 2 0 TIE (SUTURE) IMPLANT
SUT VICRYL AB 2 0 TIES (SUTURE)
SYR CONTROL 10ML LL (SYRINGE) ×2 IMPLANT
TOWEL GREEN STERILE FF (TOWEL DISPOSABLE) ×2 IMPLANT
TUBE CONNECTING 20X1/4 (TUBING) ×1 IMPLANT
YANKAUER SUCT BULB TIP NO VENT (SUCTIONS) ×1 IMPLANT

## 2019-03-22 NOTE — Op Note (Signed)
Preoperative diagnosis: History of incarcerated right inguinal hernia  Postop diagnosis: Incarcerated right inguinal hernia with preperitoneal fat without strangulation or obstruction  Procedure: Repair of right inguinal hernia with ultra Pro hernia system mesh  Surgeon: Erroll Luna, MD  Anesthesia: General with TAP block on the right and local anesthetic consisting of 0.25% Sensorcaine local with epinephrine  EBL: Minimal  Specimen: None  Drains: None  Indications for procedure: The patient is a [age]-year-old female with a history of incarcerated right inguinal hernia.  This was reduced the last month in the emergency room.  She had part of her bladder and small bowel at the time of reduction.  She presented as an outpatient for repair.  Risk, benefits and other treatment options were discussed as well as complications of surgery and potential worsening of underlying medical conditions.The risk of hernia repair include bleeding,  Infection,   Recurrence of the hernia,  Mesh use, chronic pain,  Organ injury,  Bowel injury,  Bladder injury,   nerve injury with numbness around the incision,  Death,  and worsening of preexisting  medical problems.  The alternatives to surgery have been discussed as well..  Long term expectations of both operative and non operative treatments have been discussed.   The patient agrees to proceed.  Description of procedure: The patient was met in the holding area and questions were answered.  The right groin was marked as the correct side and block was placed per anesthesia.  She was taken back to the operating.  She is placed supine upon the OR table.  After induction of general esthesia the right groin was prepped and draped in sterile fashion timeout was performed.  Local anesthetic was infiltrated along the right inguinal crease.  Incision was made in a longitudinal fashion.  Dissection was carried down until the aponeurosis of the external oblique was identified.   This was opened through the fibers of the external ring.  There is a large direct hernia.  It had incarcerated preperitoneal fat.  I opened the internal ring and help to reduce this back in the preperitoneal space.  The direct defect was then repaired using a large ultra pro hernia system with the inner leaflet placed into the preperitoneal space and the onlay placed onto the floor of the inguinal canal.  The round ligament was divided.  We then secured the mesh to the shelving edge angle ligament pubic tubercle and conjoined tendon with 2-0 Novafil.  This cover the defect nicely.  Hemostasis was achieved.  The ilioinguinal nerve was preserved.  After assuring hemostasis, the fascia of the external bleed was closed with 2-0 Vicryl.  3-0 Vicryl was used to close Scarpa's fascia and 4-0 Monocryl is used to close skin in a subcuticular fashion.  Dermabond applied.  All final counts found to be correct.  Patient was awoke extubated taken recovery in satisfactory condition.

## 2019-03-22 NOTE — Interval H&P Note (Signed)
History and Physical Interval Note:  03/22/2019 11:18 AM  Erin Esparza  has presented today for surgery, with the diagnosis of RIGHT INGUINAL HERNIA.  The various methods of treatment have been discussed with the patient and family. After consideration of risks, benefits and other options for treatment, the patient has consented to  Procedure(s) with comments: RIGHT INGUINAL HERNIA REPAIR WITH MESH (Right) - tap block as a surgical intervention.  The patient's history has been reviewed, patient examined, no change in status, stable for surgery.  I have reviewed the patient's chart and labs.  Questions were answered to the patient's satisfaction.     [place]

## 2019-03-22 NOTE — Transfer of Care (Signed)
Immediate Anesthesia Transfer of Care Note  Patient: Erin Esparza  Procedure(s) Performed: RIGHT INGUINAL HERNIA REPAIR WITH MESH (Right Groin)  Patient Location: PACU  Anesthesia Type:General  Level of Consciousness: awake, alert  and oriented  Airway & Oxygen Therapy: Patient Spontanous Breathing and Patient connected to face mask oxygen  Post-op Assessment: Report given to RN and Post -op Vital signs reviewed and stable  Post vital signs: Reviewed and stable  Last Vitals:  Vitals Value Taken Time  BP 145/70 03/22/19 1300  Temp    Pulse 89 03/22/19 1303  Resp 15 03/22/19 1303  SpO2 100 % 03/22/19 1303  Vitals shown include unvalidated device data.  Last Pain:  Vitals:   03/22/19 1009  TempSrc: Oral  PainSc: 0-No pain      Patients Stated Pain Goal: 5 ([id number])  Complications: No apparent anesthesia complications

## 2019-03-22 NOTE — Anesthesia Procedure Notes (Signed)
Procedure Name: Intubation Date/Time: 03/22/2019 11:49 AM Performed by: [place] Stephanie C, CRNA Pre-anesthesia Checklist: Patient identified, Emergency Drugs available, Suction available and Patient being monitored Patient Re-evaluated:Patient Re-evaluated prior to induction Oxygen Delivery Method: Circle system utilized Preoxygenation: Pre-oxygenation with 100% oxygen Induction Type: IV induction Ventilation: Mask ventilation without difficulty Laryngoscope Size: Mac and 3 Grade View: Grade I Tube type: Oral Tube size: 7.0 mm Number of attempts: 1 Airway Equipment and Method: Stylet and Oral airway Placement Confirmation: ETT inserted through vocal cords under direct vision,  positive ETCO2 and breath sounds checked- equal and bilateral Secured at: 21 cm Tube secured with: Tape Dental Injury: Teeth and Oropharynx as per pre-operative assessment

## 2019-03-22 NOTE — Anesthesia Procedure Notes (Signed)
Anesthesia Regional Block: TAP block   Pre-Anesthetic Checklist: ,, timeout performed, Correct Patient, Correct Site, Correct Laterality, Correct Procedure, Correct Position, site marked, Risks and benefits discussed,  Surgical consent,  Pre-op evaluation,  At surgeon's request and post-op pain management  Laterality: Right  Prep: Maximum Sterile Barrier Precautions used, chloraprep       Needles:  Injection technique: Single-shot  Needle Type: Echogenic Stimulator Needle     Needle Length: 10cm      Additional Needles:   Procedures:,,,, ultrasound used (permanent image in chart),,,,  Narrative:  Start time: 03/22/2019 11:10 AM End time: 03/22/2019 11:20 AM Injection made incrementally with aspirations every 5 mL.  Performed by: Personally  Anesthesiologist: Montez Hageman, MD  Additional Notes: Risks, benefits and alternative to block explained extensively.  Patient tolerated procedure well, without complications.

## 2019-03-22 NOTE — Anesthesia Preprocedure Evaluation (Addendum)
Anesthesia Evaluation  Patient identified by MRN, date of birth, ID band Patient awake    Reviewed: Allergy & Precautions, NPO status , Patient's Chart, lab work & pertinent test results  Airway Mallampati: II  TM Distance: >3 FB Neck ROM: Full    Dental no notable dental hx.    Pulmonary asthma , former smoker, PE   Pulmonary exam normal breath sounds clear to auscultation       Cardiovascular hypertension, +CHF and + DVT  Normal cardiovascular exam Rhythm:Regular Rate:Normal     Neuro/Psych negative neurological ROS  negative psych ROS   GI/Hepatic negative GI ROS, Neg liver ROS,   Endo/Other  Morbid obesity  Renal/GU negative Renal ROS  negative genitourinary   Musculoskeletal negative musculoskeletal ROS (+)   Abdominal   Peds negative pediatric ROS (+)  Hematology  (+) Blood dyscrasia, anemia ,   Anesthesia Other Findings   Reproductive/Obstetrics negative OB ROS                            Anesthesia Physical Anesthesia Plan  ASA: III  Anesthesia Plan: General   Post-op Pain Management:  Regional for Post-op pain   Induction: Intravenous  PONV Risk Score and Plan: 3 and Ondansetron, Dexamethasone and Treatment may vary due to age or medical condition  Airway Management Planned: Oral ETT  Additional Equipment:   Intra-op Plan:   Post-operative Plan: Extubation in OR  Informed Consent: I have reviewed the patients History and Physical, chart, labs and discussed the procedure including the risks, benefits and alternatives for the proposed anesthesia with the patient or authorized representative who has indicated his/her understanding and acceptance.     Dental advisory given  Plan Discussed with: CRNA  Anesthesia Plan Comments:        Anesthesia Quick Evaluation

## 2019-03-22 NOTE — Discharge Instructions (Signed)
[facility] _______[facility]  UMBILICAL OR INGUINAL HERNIA REPAIR: POST OP INSTRUCTIONS  Always review your discharge instruction sheet given to you by the facility where your surgery was performed. IF YOU HAVE DISABILITY OR FAMILY LEAVE FORMS, YOU MUST BRING THEM TO THE OFFICE FOR PROCESSING.   DO NOT GIVE THEM TO YOUR DOCTOR.  1. A  prescription for pain medication may be given to you upon discharge.  Take your pain medication as prescribed, if needed.  If narcotic pain medicine is not needed, then you may take acetaminophen (Tylenol) or ibuprofen (Advil) as needed. Next dose of Tylenol can be taken at 5pm today.  2. Take your usually prescribed medications unless otherwise directed. If you need a refill on your pain medication, please contact your pharmacy.  They will contact our office to request authorization. Prescriptions will not be filled after 5 pm or on week-ends. 3. You should follow a light diet the first 24 hours after arrival home, such as soup and crackers, etc.  Be sure to include lots of fluids daily.  Resume your normal diet the day after surgery. 4.Most patients will experience some swelling and bruising around the umbilicus or in the groin and scrotum.  Ice packs and reclining will help.  Swelling and bruising can take several days to resolve.  6. It is common to experience some constipation if taking pain medication after surgery.  Increasing fluid intake and taking a stool softener (such as Colace) will usually help or prevent this problem from occurring.  A mild laxative (Milk of Magnesia or Miralax) should be taken according to package directions if there are no bowel movements after 48 hours. 7. Unless discharge instructions indicate otherwise, you may remove your bandages 24-48 hours after surgery, and you may shower at that time.  You may have steri-strips (small skin tapes) in place directly over the incision.  These strips should be left on the skin for 7-10  days.  If your surgeon used skin glue on the incision, you may shower in 24 hours.  The glue will flake off over the next 2-3 weeks.  Any sutures or staples will be removed at the office during your follow-up visit. 8. ACTIVITIES:  You may resume regular (light) daily activities beginning the next day--such as daily self-care, walking, climbing stairs--gradually increasing activities as tolerated.  You may have sexual intercourse when it is comfortable.  Refrain from any heavy lifting or straining until approved by your doctor.  a.You may drive when you are no longer taking prescription pain medication, you can comfortably wear a seatbelt, and you can safely maneuver your car and apply brakes. b.RETURN TO WORK:   _____________________________________________  9.You should see your doctor in the office for a follow-up appointment approximately 2-3 weeks after your surgery.  Make sure that you call for this appointment within a day or two after you arrive home to insure a convenient appointment time. 10.OTHER INSTRUCTIONS: _________________________    _____________________________________  WHEN TO CALL YOUR DOCTOR: 1. Fever over 101.0 2. Inability to urinate 3. Nausea and/or vomiting 4. Extreme swelling or bruising 5. Continued bleeding from incision. 6. Increased pain, redness, or drainage from the incision  The clinic staff is available to answer your questions during regular business hours.  Please dont hesitate to call and ask to speak to one of the nurses for clinical concerns.  If you have a medical emergency, go to the nearest emergency room or call 911.  A surgeon from [facility] is always on call at the hospital   [address], [place], [place], [place]  [address] ?  [address], [place], [place]   [address] [phone] ? [phone] ? FAX [phone] Web site: [email or web address]   Post Anesthesia Home Care Instructions  Activity: Get  plenty of rest for the remainder of the day. A responsible individual must stay with you for 24 hours following the procedure.  For the next 24 hours, DO NOT: -Drive a car -Paediatric nurse -Drink alcoholic beverages -Take any medication unless instructed by your physician -Make any legal decisions or sign important papers.  Meals: Start with liquid foods such as gelatin or soup. Progress to regular foods as tolerated. Avoid greasy, spicy, heavy foods. If nausea and/or vomiting occur, drink only clear liquids until the nausea and/or vomiting subsides. Call your physician if vomiting continues.  Special Instructions/Symptoms: Your throat may feel dry or sore from the anesthesia or the breathing tube placed in your throat during surgery. If this causes discomfort, gargle with warm salt water. The discomfort should disappear within 24 hours.  If you had a scopolamine patch placed behind your ear for the management of post- operative nausea and/or vomiting:  1. The medication in the patch is effective for 72 hours, after which it should be removed.  Wrap patch in a tissue and discard in the trash. Wash hands thoroughly with soap and water. 2. You may remove the patch earlier than 72 hours if you experience unpleasant side effects which may include dry mouth, dizziness or visual disturbances. 3. Avoid touching the patch. Wash your hands with soap and water after contact with the patch.  Information for Discharge Teaching: EXPAREL (bupivacaine liposome injectable suspension)   Your surgeon or anesthesiologist gave you EXPAREL(bupivacaine) to help control your pain after surgery.   EXPAREL is a local anesthetic that provides pain relief by numbing the tissue around the surgical site.  EXPAREL is designed to release pain medication over time and can control pain for up to 72 hours.  Depending on how you respond to EXPAREL, you may require less pain medication during your recovery.  Possible  side effects:  Temporary loss of sensation or ability to move in the area where bupivacaine was injected.  Nausea, vomiting, constipation  Rarely, numbness and tingling in your mouth or lips, lightheadedness, or anxiety may occur.  Call your doctor right away if you think you may be experiencing any of these sensations, or if you have other questions regarding possible side effects.  Follow all other discharge instructions given to you by your surgeon or nurse. Eat a healthy diet and drink plenty of water or other fluids.  If you return to the hospital for any reason within 96 hours following the administration of EXPAREL, it is important for health care providers to know that you have received this anesthetic. A teal colored band has been placed on your arm with the date, time and amount of EXPAREL you have received in order to alert and inform your health care providers. Please leave this armband in place for the full 96 hours following administration, and then you may remove the band.

## 2019-03-22 NOTE — Progress Notes (Signed)
Assisted Dr. Carignan with right, ultrasound guided, transabdominal plane block. Side rails up, monitors on throughout procedure. See vital signs in flow sheet. Tolerated Procedure well. 

## 2019-03-23 ENCOUNTER — Encounter (HOSPITAL_BASED_OUTPATIENT_CLINIC_OR_DEPARTMENT_OTHER): Payer: Self-pay | Admitting: Surgery

## 2019-03-23 NOTE — Anesthesia Postprocedure Evaluation (Signed)
Anesthesia Post Note  Patient: Erin Esparza  Procedure(s) Performed: RIGHT INGUINAL HERNIA REPAIR WITH MESH (Right Groin)     Patient location during evaluation: PACU Anesthesia Type: General Level of consciousness: awake and alert Pain management: pain level controlled Vital Signs Assessment: post-procedure vital signs reviewed and stable Respiratory status: spontaneous breathing, nonlabored ventilation, respiratory function stable and patient connected to nasal cannula oxygen Cardiovascular status: blood pressure returned to baseline and stable Postop Assessment: no apparent nausea or vomiting Anesthetic complications: no    Last Vitals:  Vitals:   03/22/19 1353 03/22/19 1448  BP: (!) 145/59 (!) 139/57  Pulse: 87 87  Resp: 18 20  Temp: (!) 36.2 C   SpO2: 99% 96%    Last Pain:  Vitals:   03/22/19 1353  TempSrc: Axillary  PainSc: 3                  Montez Hageman

## 2019-03-31 ENCOUNTER — Encounter: Payer: Self-pay | Admitting: Hematology and Oncology

## 2019-03-31 ENCOUNTER — Telehealth: Payer: Self-pay

## 2019-03-31 ENCOUNTER — Telehealth: Payer: Self-pay | Admitting: Hematology and Oncology

## 2019-03-31 ENCOUNTER — Inpatient Hospital Stay: Payer: Medicare Other | Attending: Gynecologic Oncology | Admitting: Hematology and Oncology

## 2019-03-31 ENCOUNTER — Other Ambulatory Visit: Payer: Self-pay

## 2019-03-31 ENCOUNTER — Inpatient Hospital Stay: Payer: Medicare Other

## 2019-03-31 DIAGNOSIS — D539 Nutritional anemia, unspecified: Secondary | ICD-10-CM

## 2019-03-31 DIAGNOSIS — C7800 Secondary malignant neoplasm of unspecified lung: Secondary | ICD-10-CM | POA: Insufficient documentation

## 2019-03-31 DIAGNOSIS — C541 Malignant neoplasm of endometrium: Secondary | ICD-10-CM | POA: Insufficient documentation

## 2019-03-31 DIAGNOSIS — G620 Drug-induced polyneuropathy: Secondary | ICD-10-CM | POA: Insufficient documentation

## 2019-03-31 DIAGNOSIS — C778 Secondary and unspecified malignant neoplasm of lymph nodes of multiple regions: Secondary | ICD-10-CM | POA: Insufficient documentation

## 2019-03-31 DIAGNOSIS — D6481 Anemia due to antineoplastic chemotherapy: Secondary | ICD-10-CM

## 2019-03-31 DIAGNOSIS — T451X5A Adverse effect of antineoplastic and immunosuppressive drugs, initial encounter: Secondary | ICD-10-CM

## 2019-03-31 DIAGNOSIS — C55 Malignant neoplasm of uterus, part unspecified: Secondary | ICD-10-CM

## 2019-03-31 DIAGNOSIS — C787 Secondary malignant neoplasm of liver and intrahepatic bile duct: Secondary | ICD-10-CM | POA: Diagnosis not present

## 2019-03-31 DIAGNOSIS — Z452 Encounter for adjustment and management of vascular access device: Secondary | ICD-10-CM | POA: Diagnosis not present

## 2019-03-31 DIAGNOSIS — N189 Chronic kidney disease, unspecified: Secondary | ICD-10-CM | POA: Insufficient documentation

## 2019-03-31 LAB — COMPREHENSIVE METABOLIC PANEL
ALT: 8 U/L (ref 0–44)
AST: 16 U/L (ref 15–41)
Albumin: 3.2 g/dL — ABNORMAL LOW (ref 3.5–5.0)
Alkaline Phosphatase: 74 U/L (ref 38–126)
Anion gap: 11 (ref 5–15)
BUN: 22 mg/dL (ref 8–23)
CO2: 25 mmol/L (ref 22–32)
Calcium: 9.3 mg/dL (ref 8.9–10.3)
Chloride: 102 mmol/L (ref 98–111)
Creatinine, Ser: 1.11 mg/dL — ABNORMAL HIGH (ref 0.44–1.00)
GFR calc Af Amer: 55 mL/min — ABNORMAL LOW (ref >60)
GFR calc non Af Amer: 47 mL/min — ABNORMAL LOW (ref >60)
Glucose, Bld: 93 mg/dL (ref 70–99)
Potassium: 4.1 mmol/L (ref 3.5–5.1)
Sodium: 138 mmol/L (ref 135–145)
Total Bilirubin: <0.2 mg/dL — ABNORMAL LOW (ref 0.3–1.2)
Total Protein: 7.6 g/dL (ref 6.5–8.1)

## 2019-03-31 LAB — CBC WITH DIFFERENTIAL/PLATELET
Abs Immature Granulocytes: 0.03 10*3/uL (ref 0.00–0.07)
Basophils Absolute: 0 10*3/uL (ref 0.0–0.1)
Basophils Relative: 0 %
Eosinophils Absolute: 0.2 10*3/uL (ref 0.0–0.5)
Eosinophils Relative: 3 %
HCT: 26.9 % — ABNORMAL LOW (ref 36.0–46.0)
Hemoglobin: 8.9 g/dL — ABNORMAL LOW (ref 12.0–15.0)
Immature Granulocytes: 0 %
Lymphocytes Relative: 13 %
Lymphs Abs: 1 10*3/uL (ref 0.7–4.0)
MCH: 34.2 pg — ABNORMAL HIGH (ref 26.0–34.0)
MCHC: 33.1 g/dL (ref 30.0–36.0)
MCV: 103.5 fL — ABNORMAL HIGH (ref 80.0–100.0)
Monocytes Absolute: 1 10*3/uL (ref 0.1–1.0)
Monocytes Relative: 13 %
Neutro Abs: 5.5 10*3/uL (ref 1.7–7.7)
Neutrophils Relative %: 71 %
Platelets: 216 10*3/uL (ref 150–400)
RBC: 2.6 MIL/uL — ABNORMAL LOW (ref 3.87–5.11)
RDW: 12.9 % (ref 11.5–15.5)
WBC: 7.7 10*3/uL (ref 4.0–10.5)
nRBC: 0 % (ref 0.0–0.2)

## 2019-03-31 LAB — VITAMIN B12: Vitamin B-12: 388 pg/mL (ref 180–914)

## 2019-03-31 MED ORDER — SODIUM CHLORIDE 0.9% FLUSH
10.0000 mL | Freq: Once | INTRAVENOUS | Status: AC
  Administered 2019-03-31: 10 mL
  Filled 2019-03-31: qty 10

## 2019-03-31 MED ORDER — HEPARIN SOD (PORK) LOCK FLUSH 100 UNIT/ML IV SOLN
500.0000 [IU] | Freq: Once | INTRAVENOUS | Status: AC
  Administered 2019-03-31: 500 [IU]
  Filled 2019-03-31: qty 5

## 2019-03-31 NOTE — Assessment & Plan Note (Signed)
Her peripheral neuropathy is stable and not worse Observe only for now She denies recent falls

## 2019-03-31 NOTE — Telephone Encounter (Signed)
She called and left a message that she does not want to do CT scan on birthday.  Called back and instructed per Dr. Alvy Bimler to do scan on 12/23. When scheduler calls she should have them schedule la/ flush 1 hour prior to scan. She verbalized understanding. Instructed to call the office for concerns.

## 2019-03-31 NOTE — Assessment & Plan Note (Signed)
Clinically, she have no signs or symptoms to suggest cancer progression We will continue chemo holiday I plan to see her back again in 2 months for repeat blood work, examination and CT imaging She agreed

## 2019-03-31 NOTE — Telephone Encounter (Signed)
Scheduled appt per 10/29 sch message - mailed reminder letter with appt date and time

## 2019-03-31 NOTE — Assessment & Plan Note (Signed)
She has multifactorial anemia, likely anemia chronic disease, related to chronic kidney disease and recent surgery She is not symptomatic She does not need transfusion support today

## 2019-03-31 NOTE — Progress Notes (Signed)
[place] OFFICE PROGRESS NOTE  Patient Care Team: [name], DO as PCP - General (Family Medicine) [name], MD as Consulting Physician (Ophthalmology) Wallene Huh, DPM as Consulting Physician (Podiatry) [name], [name], MD as Referring Physician (Optometry) [name], MD as Consulting Physician (Orthopedic Surgery) [name], RN as Oncology Nurse Navigator (Oncology)  ASSESSMENT & PLAN:  Uterine cancer [facility]) Clinically, she have no signs or symptoms to suggest cancer progression We will continue chemo holiday I plan to see her back again in 2 months for repeat blood work, examination and CT imaging She agreed  Anemia due to antineoplastic chemotherapy She has multifactorial anemia, likely anemia chronic disease, related to chronic kidney disease and recent surgery She is not symptomatic She does not need transfusion support today  Peripheral neuropathy due to chemotherapy [facility]) Her peripheral neuropathy is stable and not worse Observe only for now She denies recent falls   Orders Placed This Encounter  Procedures  . CT ABDOMEN PELVIS W CONTRAST    Standing Status:   Future    Standing Expiration Date:   03/30/2020    Order Specific Question:   If indicated for the ordered procedure, I authorize the administration of contrast media per Radiology protocol    Answer:   Yes    Order Specific Question:   Preferred imaging location?    Answer:   [facility]    Order Specific Question:   Radiology Contrast Protocol - do NOT remove file path    Answer:   [email or web address]    INTERVAL HISTORY: Please see below for problem oriented charting. 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 Ultrasound irregular hypoechoic mass 7 x 6 x 5 mm   01/06/2012 Initial Biopsy   Initial biopsy was benign . 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 Cervix, biopsy - HIGH GRADE CARCINOMA. - SEE COMMENT. 2. Endocervix, curettage - HIGH GRADE CARCINOMA. - SEE COMMENT. 3. Endometrium, biopsy - HIGH GRADE CARCINOMA. - SEE COMMENT. Microscopic Comment 1. - 3. The carcinoma in the three specimens is morphologically similar and has features suggestive of papillary serous carcinoma.   07/26/2018 Tumor Marker   Patient's tumor was tested for the following markers: CA-125 Results of the tumor marker test revealed 835  07/28/2018 Imaging   1. Several (at least 10) solid pulmonary nodules scattered throughout both lungs are new since 2012 chest CT, likely representing pulmonary metastases, largest 6 mm, below PET resolution. 2. Redemonstration of hypodense 2.5 cm segment 7 right liver lobe mass, indeterminate, suspicious for liver  metastasis. 3. 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 4. Three-vessel coronary atherosclerosis.  Aortic Atherosclerosis (ICD10-I70.0).   08/03/2018 Cancer Staging   Staging form: Corpus Uteri - Carcinoma and Carcinosarcoma, AJCC 8th Edition - Clinical: FIGO Stage IVB (cT3b, cN2, cM1) - Signed by [name], MD on 08/03/2018   08/05/2018 Tumor Marker   Patient's tumor was tested for the following markers: CA-125 Results of the tumor marker test revealed 901   08/06/2018 Procedure   Placement of single lumen port a cath via right internal jugular vein. The catheter tip lies at the cavo-atrial junction. A power injectable port a cath was placed and is ready for immediate use.    08/11/2018 - 01/12/2019 Chemotherapy   The patient had carboplatin and taxol x 8 cycles   09/01/2018 Tumor Marker   Patient's tumor was tested for the following markers: CA-125 Results of the tumor marker test revealed 406   09/09/2018 Genetic Testing   The Common Hereditary Cancers Panel offered by [facility] includes sequencing and/or deletion duplication testing of the following 48 genes: APC, ATM, AXIN2, BARD1, BMPR1A, BRCA1, BRCA2, BRIP1, CDH1, CDKN2A (p14ARF), CDKN2A (p16INK4a), CKD4, CHEK2, CTNNA1, DICER1, EPCAM (Deletion/duplication testing only), GREM1 (promoter region deletion/duplication testing only), KIT, MEN1, MLH1, MSH2, MSH3, MSH6, MUTYH, NBN, NF1, NHTL1, PALB2, PDGFRA, PMS2, POLD1, POLE, PTEN, RAD50, RAD51C, RAD51D, RNF43, SDHB, SDHC, SDHD, SMAD4, SMARCA4. STK11, TP53, TSC1, TSC2, and VHL.  The following genes were evaluated for sequence changes only: SDHA and HOXB13 c.251G>A variant only.  Results: Negative, no pathogenic variants identified. The report date is 09/09/2018.     09/22/2018 Tumor Marker   Patient's tumor was tested for the following markers: CA-125 Results of the tumor marker test revealed 110   10/12/2018 Tumor Marker   Patient's tumor was tested for the following  markers: CA-125 Results of the tumor marker test revealed 55.3   10/12/2018 Imaging   1. Mild decrease in pulmonary metastases, liver metastases, and metastatic abdominal and pelvic lymphadenopathy. 2. Decreased size of complex cystic lesion in left presacral region. 3. No new or progressive metastatic disease identified. 4. Stable small right inguinal hernia containing small portion of urinary bladder.   11/03/2018 Tumor Marker   Patient's tumor was tested for the following markers: CA-125 Results of the tumor marker test revealed 34.3    Genetic Testing   Patient has genetic testing done for HER2. Results revealed patient has the following: HER2 - negative from pathology from 07/26/18.   12/20/2018 Imaging   1. Possible new omental nodule. 2. Improving abdominal and pelvic retroperitoneal adenopathy. 3. Stable pulmonary and hepatic metastatic disease. 4. Left perirectal/presacral fluid collection has decreased in size. 5. Right inguinal hernia contains a small portion of the bladder, as before. 6. Aortic atherosclerosis (ICD10-170.0). Coronary artery calcification   12/21/2018 Tumor Marker   Patient's tumor was tested for the following markers: CA-125 Results of the tumor marker test revealed 15.6   01/15/2019 Imaging   Ct abdomen and pelvis 1. Small hiatal hernia with fluid in the distal esophagus, which may represent reflux or esophagitis. No other acute findings. 2. Right inguinal hernia contains anterolateral aspect of the urinary bladder, unchanged in appearance from prior. Mild edema within the hernia  sac is similar. 3. Unchanged small low-density lesions in the liver. 4. Unchanged retroperitoneal and bilateral iliac lymph nodes. Small ventral abdominal omental nodule tentatively identified and unchanged from prior.   Aortic Atherosclerosis (ICD10-I70.0).   02/02/2019 Tumor Marker   Patient's tumor was tested for the following markers: CA-125 Results of the tumor marker test  revealed 7.8   03/22/2019 Surgery   Preoperative diagnosis: History of incarcerated right inguinal hernia   Postop diagnosis: Incarcerated right inguinal hernia with preperitoneal fat without strangulation or obstruction   Procedure: Repair of right inguinal hernia with ultra Pro hernia system mesh   Surgeon: Erroll Luna, MD     REVIEW OF SYSTEMS:   Constitutional: Denies fevers, chills or abnormal weight loss Eyes: Denies blurriness of vision Ears, nose, mouth, throat, and face: Denies mucositis or sore throat Respiratory: Denies cough, dyspnea or wheezes Cardiovascular: Denies palpitation, chest discomfort or lower extremity swelling Gastrointestinal:  Denies nausea, heartburn or change in bowel habits Skin: Denies abnormal skin rashes Lymphatics: Denies new lymphadenopathy or easy bruising Neurological:Denies numbness, tingling or new weaknesses Behavioral/Psych: Mood is stable, no new changes  All other systems were reviewed with the patient and are negative.  I have reviewed the past medical history, past surgical history, social history and family history with the patient and they are unchanged from previous note.  ALLERGIES:  has No Known Allergies.  MEDICATIONS:  Current Outpatient Medications  Medication Sig Dispense Refill  . acetaminophen (TYLENOL) 500 MG tablet Take 1 tablet (500 mg total) by mouth every 6 (six) hours as needed. (Patient taking differently: Take 1,000 mg by mouth every 6 (six) hours as needed for moderate pain. ) 30 tablet 3  . Calcium Carbonate (CALTRATE 600 PO) Take 1 tablet by mouth 2 (two) times daily.     . enalapril (VASOTEC) 20 MG tablet TAKE 1 TABLET(20 MG) BY MOUTH TWICE DAILY (Patient taking differently: Take 20 mg by mouth daily. ) 180 tablet 2  . Fluticasone-Salmeterol (ADVAIR DISKUS) 100-50 MCG/DOSE AEPB USE 1 INHALATION BY MOUTH TWICE DAILY 60 each 2  . furosemide (LASIX) 40 MG tablet TAKE 1 TABLET BY MOUTH DAILY AS NEEDED FOR LEG  SWELLING (Patient taking differently: Take 40 mg by mouth daily as needed (leg swelling). ) 90 tablet 2  . hydrochlorothiazide (HYDRODIURIL) 25 MG tablet TAKE 1 TABLET(25 MG) BY MOUTH DAILY (Patient taking differently: Take 25 mg by mouth daily. ) 90 tablet 1  . lidocaine-prilocaine (EMLA) cream Apply to affected area once (Patient taking differently: Apply 1 application topically daily as needed. Apply to affected area once) 30 g 3  . Multiple Vitamin (MULTIVITAMIN) tablet Take 1 tablet by mouth daily.    . Multiple Vitamins-Minerals (ICAPS AREDS 2 PO) Take 1 capsule by mouth 2 (two) times daily.    . ondansetron (ZOFRAN) 8 MG tablet Take 1 tablet (8 mg total) by mouth every 8 (eight) hours as needed. Start on the third day after chemotherapy. 30 tablet 1  . oxyCODONE (OXY IR/ROXICODONE) 5 MG immediate release tablet Take 1 tablet (5 mg total) by mouth every 6 (six) hours as needed for severe pain. 15 tablet 0  . polyethylene glycol (MIRALAX / GLYCOLAX) 17 g packet Take 17 g by mouth daily as needed for moderate constipation.    . prochlorperazine (COMPAZINE) 10 MG tablet Take 1 tablet (10 mg total) by mouth every 6 (six) hours as needed (Nausea or vomiting). (Patient not taking: Reported on 02/07/2019) 30 tablet 1  . rivaroxaban (XARELTO)  20 MG TABS tablet Take 1 tablet (20 mg total) by mouth daily with supper. 90 tablet 1  . Vitamin D, Cholecalciferol, 1000 units CAPS Take 1,000 Units by mouth daily.      No current facility-administered medications for this visit.     PHYSICAL EXAMINATION: ECOG PERFORMANCE STATUS: 1 - Symptomatic but completely ambulatory  Vitals:   03/31/19 1322  BP: 136/62  Pulse: 86  Resp: 18  Temp: 98.5 F (36.9 C)  SpO2: 100%   Filed Weights   03/31/19 1322  Weight: 257 lb 1.6 oz (116.6 kg)    GENERAL:alert, no distress and comfortable SKIN: skin color, texture, turgor are normal, no rashes or significant lesions EYES: normal, Conjunctiva are pink and  non-injected, sclera clear OROPHARYNX:no exudate, no erythema and lips, buccal mucosa, and tongue normal  NECK: supple, thyroid normal size, non-tender, without nodularity LYMPH:  no palpable lymphadenopathy in the cervical, axillary or inguinal LUNGS: clear to auscultation and percussion with normal breathing effort HEART: regular rate & rhythm and no murmurs and no lower extremity edema ABDOMEN:abdomen soft, mild tenderness on palpation, well-healed surgical scar Musculoskeletal:no cyanosis of digits and no clubbing  NEURO: alert & oriented x 3 with fluent speech, no focal motor/sensory deficits  LABORATORY DATA:  I have reviewed the data as listed    Component Value Date/Time   NA 138 03/31/2019 1259   NA 142 12/25/2014 1122   K 4.1 03/31/2019 1259   K 4.1 12/25/2014 1122   CL 102 03/31/2019 1259   CL 102 10/04/2012 1159   CO2 25 03/31/2019 1259   CO2 29 12/25/2014 1122   GLUCOSE 93 03/31/2019 1259   GLUCOSE 99 12/25/2014 1122   GLUCOSE 116 (H) 10/04/2012 1159   BUN 22 03/31/2019 1259   BUN 13.7 12/25/2014 1122   CREATININE 1.11 (H) 03/31/2019 1259   CREATININE 1.08 (H) 12/02/2018 1432   CREATININE 0.96 (H) 02/27/2016 1530   CREATININE 0.9 12/25/2014 1122   CALCIUM 9.3 03/31/2019 1259   CALCIUM 10.1 12/25/2014 1122   PROT 7.6 03/31/2019 1259   PROT 7.8 12/25/2014 1122   ALBUMIN 3.2 (L) 03/31/2019 1259   ALBUMIN 3.4 (L) 12/25/2014 1122   AST 16 03/31/2019 1259   AST 22 12/02/2018 1432   AST 21 12/25/2014 1122   ALT 8 03/31/2019 1259   ALT 13 12/02/2018 1432   ALT 17 12/25/2014 1122   ALKPHOS 74 03/31/2019 1259   ALKPHOS 99 12/25/2014 1122   BILITOT <0.2 (L) 03/31/2019 1259   BILITOT <0.3 (L) 12/02/2018 1432   BILITOT 0.41 12/25/2014 1122   GFRNONAA 47 (L) 03/31/2019 1259   GFRNONAA 49 (L) 12/02/2018 1432   GFRNONAA 58 (L) 02/27/2016 1530   GFRAA 55 (L) 03/31/2019 1259   GFRAA 57 (L) 12/02/2018 1432   GFRAA 66 02/27/2016 1530    No results found for: SPEP,  UPEP  Lab Results  Component Value Date   WBC 7.7 03/31/2019   NEUTROABS 5.5 03/31/2019   HGB 8.9 (L) 03/31/2019   HCT 26.9 (L) 03/31/2019   MCV 103.5 (H) 03/31/2019   PLT 216 03/31/2019      Chemistry      Component Value Date/Time   NA 138 03/31/2019 1259   NA 142 12/25/2014 1122   K 4.1 03/31/2019 1259   K 4.1 12/25/2014 1122   CL 102 03/31/2019 1259   CL 102 10/04/2012 1159   CO2 25 03/31/2019 1259   CO2 29 12/25/2014 1122   BUN 22 03/31/2019  1259   BUN 13.7 12/25/2014 1122   CREATININE 1.11 (H) 03/31/2019 1259   CREATININE 1.08 (H) 12/02/2018 1432   CREATININE 0.96 (H) 02/27/2016 1530   CREATININE 0.9 12/25/2014 1122      Component Value Date/Time   CALCIUM 9.3 03/31/2019 1259   CALCIUM 10.1 12/25/2014 1122   ALKPHOS 74 03/31/2019 1259   ALKPHOS 99 12/25/2014 1122   AST 16 03/31/2019 1259   AST 22 12/02/2018 1432   AST 21 12/25/2014 1122   ALT 8 03/31/2019 1259   ALT 13 12/02/2018 1432   ALT 17 12/25/2014 1122   BILITOT <0.2 (L) 03/31/2019 1259   BILITOT <0.3 (L) 12/02/2018 1432   BILITOT 0.41 12/25/2014 1122       All questions were answered. The patient knows to call the clinic with any problems, questions or concerns. No barriers to learning was detected.  I spent 15 minutes counseling the patient face to face. The total time spent in the appointment was 20 minutes and more than 50% was on counseling and review of test results  [name], MD 03/31/2019 1:39 PM

## 2019-04-01 LAB — SAMPLE TO BLOOD BANK

## 2019-04-01 LAB — CA 125: Cancer Antigen (CA) 125: 24.4 U/mL (ref 0.0–38.1)

## 2019-04-14 ENCOUNTER — Encounter: Payer: Self-pay | Admitting: Podiatry

## 2019-04-14 ENCOUNTER — Ambulatory Visit (INDEPENDENT_AMBULATORY_CARE_PROVIDER_SITE_OTHER): Payer: Medicare Other | Admitting: Podiatry

## 2019-04-14 ENCOUNTER — Other Ambulatory Visit: Payer: Self-pay

## 2019-04-14 DIAGNOSIS — G629 Polyneuropathy, unspecified: Secondary | ICD-10-CM | POA: Diagnosis not present

## 2019-04-14 DIAGNOSIS — M25572 Pain in left ankle and joints of left foot: Secondary | ICD-10-CM

## 2019-04-14 DIAGNOSIS — L84 Corns and callosities: Secondary | ICD-10-CM | POA: Diagnosis not present

## 2019-04-14 DIAGNOSIS — G8929 Other chronic pain: Secondary | ICD-10-CM

## 2019-04-18 ENCOUNTER — Other Ambulatory Visit: Payer: Self-pay

## 2019-04-18 NOTE — Patient Outreach (Signed)
[facility] [facility]) Care Management  04/18/2019  MADELINA SEIDERS Oct 20, 1940 [id number]   Medication Adherence call to Mr. Thai Leask Hippa Identifiers Verify spoke with patient she is past due on Enalepril 20 mg patient explain she is only taking 1 tablet daily not 1 tablet 2 times daily,patient has plenty at this time.Mrs. Depaul is showing past due under [place].   [place] Management Direct Dial [phone]  Fax [phone] [email or web address]

## 2019-04-18 NOTE — Progress Notes (Signed)
Subjective:   Patient ID: Erin Esparza, female   DOB: 78 y.o.   MRN: [id number]   HPI Patient presents concerned about cold feeling of her feet bilateral with tingling at times with digital deformities noted also states she is got corn callus formation which gets tender on both feet   ROS      Objective:  Physical Exam  Neurovascular status intact with chronic keratotic lesion subsecond fifth metatarsal bilateral mild diminishment sharp dull vibratory bilateral and patient noted to have moderate structural deformity     Assessment:  Lesion formation bilateral with pain along with probable neuropathic condition which is developing     Plan:  H&P reviewed conditions and at this point debrided lesions with no iatrogenic bleeding discussed conservative treatment anti-inflammatory supportive shoes and patient will be seen back and may require medication for neuropathy that I educated her on today

## 2019-05-12 ENCOUNTER — Telehealth: Payer: Self-pay | Admitting: Hematology and Oncology

## 2019-05-12 NOTE — Telephone Encounter (Signed)
R/s appt per 12/10 sch message - pt is aware of appt date and time

## 2019-05-25 ENCOUNTER — Other Ambulatory Visit: Payer: Self-pay

## 2019-05-25 ENCOUNTER — Ambulatory Visit (HOSPITAL_COMMUNITY)
Admission: RE | Admit: 2019-05-25 | Discharge: 2019-05-25 | Disposition: A | Discharge status: Home / Self Care | Payer: Medicare Other | Source: Ambulatory Visit | Attending: Hematology and Oncology | Admitting: Hematology and Oncology

## 2019-05-25 ENCOUNTER — Inpatient Hospital Stay: Payer: Medicare Other | Attending: Gynecologic Oncology

## 2019-05-25 ENCOUNTER — Inpatient Hospital Stay: Payer: Medicare Other

## 2019-05-25 DIAGNOSIS — C55 Malignant neoplasm of uterus, part unspecified: Secondary | ICD-10-CM | POA: Diagnosis present

## 2019-05-25 DIAGNOSIS — T451X5A Adverse effect of antineoplastic and immunosuppressive drugs, initial encounter: Secondary | ICD-10-CM

## 2019-05-25 DIAGNOSIS — Z452 Encounter for adjustment and management of vascular access device: Secondary | ICD-10-CM | POA: Insufficient documentation

## 2019-05-25 DIAGNOSIS — C541 Malignant neoplasm of endometrium: Secondary | ICD-10-CM | POA: Insufficient documentation

## 2019-05-25 DIAGNOSIS — D6481 Anemia due to antineoplastic chemotherapy: Secondary | ICD-10-CM

## 2019-05-25 LAB — CBC WITH DIFFERENTIAL/PLATELET
Abs Immature Granulocytes: 0.04 10*3/uL (ref 0.00–0.07)
Basophils Absolute: 0 10*3/uL (ref 0.0–0.1)
Basophils Relative: 1 %
Eosinophils Absolute: 0.2 10*3/uL (ref 0.0–0.5)
Eosinophils Relative: 3 %
HCT: 29.1 % — ABNORMAL LOW (ref 36.0–46.0)
Hemoglobin: 9.4 g/dL — ABNORMAL LOW (ref 12.0–15.0)
Immature Granulocytes: 1 %
Lymphocytes Relative: 13 %
Lymphs Abs: 1 10*3/uL (ref 0.7–4.0)
MCH: 32.5 pg (ref 26.0–34.0)
MCHC: 32.3 g/dL (ref 30.0–36.0)
MCV: 100.7 fL — ABNORMAL HIGH (ref 80.0–100.0)
Monocytes Absolute: 0.7 10*3/uL (ref 0.1–1.0)
Monocytes Relative: 9 %
Neutro Abs: 5.8 10*3/uL (ref 1.7–7.7)
Neutrophils Relative %: 73 %
Platelets: 231 10*3/uL (ref 150–400)
RBC: 2.89 MIL/uL — ABNORMAL LOW (ref 3.87–5.11)
RDW: 12.8 % (ref 11.5–15.5)
WBC: 7.7 10*3/uL (ref 4.0–10.5)
nRBC: 0 % (ref 0.0–0.2)

## 2019-05-25 LAB — COMPREHENSIVE METABOLIC PANEL
ALT: 6 U/L (ref 0–44)
AST: 17 U/L (ref 15–41)
Albumin: 3.6 g/dL (ref 3.5–5.0)
Alkaline Phosphatase: 67 U/L (ref 38–126)
Anion gap: 10 (ref 5–15)
BUN: 22 mg/dL (ref 8–23)
CO2: 25 mmol/L (ref 22–32)
Calcium: 9.4 mg/dL (ref 8.9–10.3)
Chloride: 104 mmol/L (ref 98–111)
Creatinine, Ser: 1.13 mg/dL — ABNORMAL HIGH (ref 0.44–1.00)
GFR calc Af Amer: 54 mL/min — ABNORMAL LOW (ref >60)
GFR calc non Af Amer: 46 mL/min — ABNORMAL LOW (ref >60)
Glucose, Bld: 94 mg/dL (ref 70–99)
Potassium: 4.1 mmol/L (ref 3.5–5.1)
Sodium: 139 mmol/L (ref 135–145)
Total Bilirubin: 0.3 mg/dL (ref 0.3–1.2)
Total Protein: 8.1 g/dL (ref 6.5–8.1)

## 2019-05-25 LAB — SAMPLE TO BLOOD BANK

## 2019-05-25 MED ORDER — HEPARIN SOD (PORK) LOCK FLUSH 100 UNIT/ML IV SOLN
500.0000 [IU] | Freq: Once | INTRAVENOUS | Status: AC
  Administered 2019-05-25: 500 [IU] via INTRAVENOUS

## 2019-05-25 MED ORDER — SODIUM CHLORIDE 0.9% FLUSH
10.0000 mL | Freq: Once | INTRAVENOUS | Status: AC
  Administered 2019-05-25: 10 mL
  Filled 2019-05-25: qty 10

## 2019-05-25 MED ORDER — SODIUM CHLORIDE (PF) 0.9 % IJ SOLN
INTRAMUSCULAR | Status: AC
  Filled 2019-05-25: qty 50

## 2019-05-25 MED ORDER — HEPARIN SOD (PORK) LOCK FLUSH 100 UNIT/ML IV SOLN
INTRAVENOUS | Status: AC
  Filled 2019-05-25: qty 5

## 2019-05-25 MED ORDER — IOHEXOL 300 MG/ML  SOLN
100.0000 mL | Freq: Once | INTRAMUSCULAR | Status: AC | PRN
  Administered 2019-05-25: 100 mL via INTRAVENOUS

## 2019-05-26 LAB — CA 125: Cancer Antigen (CA) 125: 287 U/mL — ABNORMAL HIGH (ref 0.0–38.1)

## 2019-05-29 ENCOUNTER — Other Ambulatory Visit: Payer: Self-pay | Admitting: Medical

## 2019-05-30 ENCOUNTER — Other Ambulatory Visit: Payer: Medicare Other

## 2019-05-30 ENCOUNTER — Telehealth: Payer: Self-pay

## 2019-05-30 NOTE — Telephone Encounter (Signed)
Called and given below message. She verbalized understanding and will bring one family member with her tomorrow.

## 2019-05-30 NOTE — Telephone Encounter (Signed)
-----   Message from [name], MD sent at 05/30/2019  8:30 AM EST ----- Regarding: for better communication I recommend 1 family member to accompany her to appt

## 2019-05-31 ENCOUNTER — Telehealth: Payer: Self-pay | Admitting: Hematology and Oncology

## 2019-05-31 ENCOUNTER — Encounter: Payer: Self-pay | Admitting: Hematology and Oncology

## 2019-05-31 ENCOUNTER — Telehealth: Payer: Self-pay | Admitting: Pharmacist

## 2019-05-31 ENCOUNTER — Other Ambulatory Visit: Payer: Self-pay

## 2019-05-31 ENCOUNTER — Telehealth: Payer: Self-pay

## 2019-05-31 ENCOUNTER — Inpatient Hospital Stay (HOSPITAL_BASED_OUTPATIENT_CLINIC_OR_DEPARTMENT_OTHER): Payer: Medicare Other | Admitting: Hematology and Oncology

## 2019-05-31 VITALS — BP 154/68 | HR 95 | Temp 98.3°F | Resp 16 | Ht 67.0 in | Wt 255.9 lb

## 2019-05-31 DIAGNOSIS — Z452 Encounter for adjustment and management of vascular access device: Secondary | ICD-10-CM | POA: Diagnosis not present

## 2019-05-31 DIAGNOSIS — I10 Essential (primary) hypertension: Secondary | ICD-10-CM

## 2019-05-31 DIAGNOSIS — C55 Malignant neoplasm of uterus, part unspecified: Secondary | ICD-10-CM | POA: Diagnosis not present

## 2019-05-31 DIAGNOSIS — Z7189 Other specified counseling: Secondary | ICD-10-CM | POA: Diagnosis not present

## 2019-05-31 DIAGNOSIS — C541 Malignant neoplasm of endometrium: Secondary | ICD-10-CM | POA: Diagnosis not present

## 2019-05-31 MED ORDER — LENVATINIB (10 MG DAILY DOSE) 10 MG PO CPPK: 10.0000 mg | ORAL_CAPSULE | Freq: Every day | ORAL | 11 refills | Status: DC

## 2019-05-31 NOTE — Progress Notes (Signed)
DISCONTINUE ON PATHWAY REGIMEN - Uterine     A cycle is every 21 days:     Paclitaxel      Carboplatin   **Always confirm dose/schedule in your pharmacy ordering system**  REASON: Disease Progression PRIOR TREATMENT: [id number]: Referral to Radiation Followed by Carboplatin/Paclitaxel (6/175) q21 Days x 6 Cycles TREATMENT RESPONSE: Progressive Disease (PD)  START OFF PATHWAY REGIMEN - Uterine   [id number]:Lenvatinib 20 mg PO Daily D1-21 + Pembrolizumab 200 mg IV D1 q21 Days:   A cycle is every 21 days:     Lenvatinib      Pembrolizumab   **Always confirm dose/schedule in your pharmacy ordering system**  Patient Characteristics: Papillary Serous, Recurrent/Progressive Disease, Second Line, HER2 Negative/Unknown, Relapse < 6 Months From Prior Therapy Histology: Papillary Serous Therapeutic Status: Recurrent or Progressive Disease AJCC T Category: T2 AJCC N Category: N0 AJCC M Category: M1 AJCC 8 Stage Grouping: IVB HER2 Status: Negative Line of Therapy: Second Line Time to Recurrence: Relapse < 6 Months From Prior Therapy Intent of Therapy: Non-Curative / Palliative Intent, Discussed with Patient

## 2019-05-31 NOTE — Progress Notes (Signed)
[place] OFFICE PROGRESS NOTE  Patient Care Team: [name], DO as PCP - General (Family Medicine) [name], MD as Consulting Physician (Ophthalmology) [name], DPM as Consulting Physician (Podiatry) [name], [name], MD as Referring Physician (Optometry) [name], MD (Inactive) as Consulting Physician (Orthopedic Surgery) [name], RN as Oncology Nurse Navigator (Oncology)  ASSESSMENT & PLAN:  Uterine cancer [facility]) I have reviewed her blood work and CT imaging with the patient and her daughter Unfortunately, she has signs of disease progression Fortunately, she is not symptomatic She is interested to begin palliative treatment  We reviewed the current guidelines Goal is palliative I recommend switching her treatment with Lenvima and pembrolizumab.   The combination was approved following review conducted under [employer], an intiative of the [employer] which provides a framework for concurrent submission and review of oncology drugs among international partners. The FDA approved the combination with the [place] Therapeutic Goods Administration and [facility] [place].   Efficacy of the drugs together was investigated in Study 111/KEYNOTE-146 ([id number]), a single-arm, multicenter, open-label, multi-cohort trial that enrolled 108 patients with metastatic endometrial cancer that had progressed following at least one prior systemic therapy in any setting.  Patients took 20 mg of lenvatinib orally once daily in combination with 200 mg of pembrolizumab administered intravenously every 3 weeks until unacceptable toxicity or disease progression.  Among the 108 patients, 94 had tumors that were not MSI-H or dMMR, 11 had tumors that were MSI-H or dMMR, and in 3 patients the tumor MSI-H or dMMR status was not known.  Results  The major efficacy outcome measures were objective response rate (ORR) and duration of response  (DOR) by independent radiologic review committee using RECIST 1.1. The ORR in the 94 patients whose tumors were not MSI-H or dMMR was 38.3% (95% CI: 29%, 49%) with 10 complete responses (10.6%) and 26 partial responses (27.7%). Median DOR was not reached at the time of data cutoff and 25 patients (69% of responders) had response durations ?6 months.  In another updated publication published on JCO: DOI: 10.1200/JCO.19.02627 Journal of Clinical Oncology, Published online August 13, 2018. Lenvatinib Plus Pembrolizumab in Patients With Advanced Endometrial Cancer Abstract PURPOSE  Patients with advanced endometrial carcinoma have limited treatment options. We report final primary efficacy analysis results for a patient cohort with advanced endometrial carcinoma receiving lenvatinib plus pembrolizumab in an ongoing phase Ib/II study of selected solid tumors. METHODS  Patients took lenvatinib 20 mg once daily orally plus pembrolizumab 200 mg intravenously once every 3 weeks, in 3-week cycles. The primary end point was objective response rate (ORR) at 24 weeks ([id number]); secondary efficacy end points included duration of response (DOR), progression-free survival (PFS), and overall survival (OS). Tumor assessments were evaluated by investigators per immune-related RECIST.  RESULTS  At data cutoff, 108 patients with previously treated endometrial carcinoma were enrolled, with a median follow-up of 18.7 months. The NLGXQ11 was 38.0% (95% CI, 28.8% to 47.8%). Among subgroups, the HERDE08 (95% CI) was 63.6% (30.8% to 89.1%) in patients with microsatellite instability (MSI)-high tumors (n = 11) and 36.2% (26.5% to 46.7%) in patients with microsatellite-stable tumors (n = 94). For previously treated patients, regardless of tumor MSI status, the median DOR was 21.2 months (95% CI, 7.6 months to not estimable), median PFS was 7.4 months (95% CI, 5.3 to 8.7 months), and median OS was 16.7 months (15.0 months to not  estimable). Grade 3 or 4 treatment-related adverse events  occurred in 83/124 (66.9%) patients. CONCLUSION  Lenvatinib plus pembrolizumab showed promising antitumor activity in patients with advanced endometrial carcinoma who have experienced disease progression after prior systemic therapy, regardless of tumor MSI status. The combination therapy had a manageable toxicity profile. The most common adverse reactions for endometrial cancer were fatigue, hypertension, musculoskeletal pain, diarrhea, decreased appetite, hypothyroidism, nausea, stomatitis, vomiting, decreased weight, abdominal pain, headache, constipation, urinary tract infection, dysphonia, hemorrhagic events, hypomagnesemia, palmar-plantar erythrodysesthesia, dyspnea, cough, and rash.   The most common adverse reactions for endometrial cancer were fatigue, hypertension, musculoskeletal pain, diarrhea, decreased appetite, hypothyroidism, nausea, stomatitis, vomiting, decreased weight, abdominal pain, headache, constipation, urinary tract infection, dysphonia, hemorrhagic events, hypomagnesemia, palmar-plantar erythrodysesthesia, dyspnea, cough, and rash.   After much discussion, the patient would like to proceed with the plan of care. I recommend she start checking her blood pressure twice a day I will see her next week before she start treatment due to high risk of anticipated complications especially in regards to uncontrolled hypertension Due to her age and baseline chronic kidney disease, I plan to reduce lenvatinib to 10 mg daily  Essential hypertension She is at risk of uncontrolled hypertension while on lenvatinib She is instructed to check her blood pressure twice a day and I will see her next week before she starts treatment  Goals of care, counseling/discussion We have numerous goals of care in the past The patient would like to be treated We discussed CODE STATUS and she wants to be resuscitated She nominated her daughter as  healthcare power of attorney   Orders Placed This Encounter  Procedures  . CBC with Differential (Cancer Center Only)    Standing Status:   Standing    Number of Occurrences:   20    Standing Expiration Date:   05/30/2020  . CMP ([place] only)    Standing Status:   Standing    Number of Occurrences:   20    Standing Expiration Date:   05/30/2020  . T4    Standing Status:   Standing    Number of Occurrences:   20    Standing Expiration Date:   05/30/2020  . TSH    Standing Status:   Standing    Number of Occurrences:   20    Standing Expiration Date:   05/30/2020  . Total Protein, Urine dipstick    Standing Status:   Standing    Number of Occurrences:   20    Standing Expiration Date:   05/30/2020  . CA 125    Standing Status:   Standing    Number of Occurrences:   11    Standing Expiration Date:   05/30/2020    INTERVAL HISTORY: Please see below for problem oriented charting. 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 Ultrasound irregular hypoechoic mass 7 x 6 x 5 mm   01/06/2012 Initial Biopsy   Initial biopsy was benign . 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 Cervix, biopsy - HIGH GRADE CARCINOMA. - SEE COMMENT. 2. Endocervix, curettage - HIGH GRADE CARCINOMA. - SEE COMMENT. 3. Endometrium, biopsy - HIGH GRADE CARCINOMA. - SEE COMMENT. Microscopic Comment 1. - 3. The carcinoma in the three specimens is morphologically similar and has features suggestive of papillary serous carcinoma.   07/26/2018 Tumor Marker   Patient's tumor was tested for the following markers: CA-125 Results of the tumor marker test revealed 835   07/28/2018 Imaging   1. Several (at least 10) solid pulmonary nodules scattered throughout both lungs are new since 2012 chest CT, likely representing pulmonary metastases, largest 6 mm, below PET resolution. 2. Redemonstration of hypodense 2.5 cm segment 7 right liver lobe mass, indeterminate, suspicious for liver metastasis. 3. 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 4. Three-vessel coronary atherosclerosis.  Aortic Atherosclerosis (ICD10-I70.0).   08/03/2018 Cancer Staging   Staging form: Corpus Uteri - Carcinoma and Carcinosarcoma, AJCC 8th Edition - Clinical: FIGO Stage IVB (cT3b, cN2, cM1) - Signed by [name], MD on 08/03/2018   08/05/2018 Tumor Marker   Patient's tumor was tested for the following markers: CA-125 Results of the tumor marker test revealed 901   08/06/2018 Procedure   Placement of single lumen port a cath via right internal jugular vein. The catheter tip lies at the cavo-atrial junction. A power injectable port a cath was placed and is ready for immediate use.    08/11/2018 - 01/12/2019 Chemotherapy   The patient had carboplatin and taxol x 8 cycles   09/01/2018 Tumor Marker   Patient's tumor was  tested for the following markers: CA-125 Results of the tumor marker test revealed 406   09/09/2018 Genetic Testing   The Common Hereditary Cancers Panel offered by [facility] includes sequencing and/or deletion duplication testing of the following 48 genes: APC, ATM, AXIN2, BARD1, BMPR1A, BRCA1, BRCA2, BRIP1, CDH1, CDKN2A (p14ARF), CDKN2A (p16INK4a), CKD4, CHEK2, CTNNA1, DICER1, EPCAM (Deletion/duplication testing only), GREM1 (promoter region deletion/duplication testing only), KIT, MEN1, MLH1, MSH2, MSH3, MSH6, MUTYH, NBN, NF1, NHTL1, PALB2, PDGFRA, PMS2, POLD1, POLE, PTEN, RAD50, RAD51C, RAD51D, RNF43, SDHB, SDHC, SDHD, SMAD4, SMARCA4. STK11, TP53, TSC1, TSC2, and VHL.  The following genes were evaluated for sequence changes only: SDHA and HOXB13 c.251G>A variant only.  Results: Negative, no pathogenic variants identified. The report date is 09/09/2018.     09/22/2018 Tumor Marker   Patient's tumor was tested for the following markers: CA-125 Results of the tumor marker test revealed 110   10/12/2018 Tumor Marker   Patient's tumor was tested for the following markers: CA-125 Results of the tumor marker test revealed 55.3   10/12/2018 Imaging   1. Mild decrease in pulmonary metastases, liver metastases, and metastatic abdominal and pelvic lymphadenopathy. 2. Decreased size of complex cystic lesion in left presacral region. 3. No new or progressive metastatic disease identified. 4. Stable small right inguinal hernia containing small portion of urinary  bladder.   11/03/2018 Tumor Marker   Patient's tumor was tested for the following markers: CA-125 Results of the tumor marker test revealed 34.3    Genetic Testing   Patient has genetic testing done for HER2. Results revealed patient has the following: HER2 - negative from pathology from 07/26/18.   12/20/2018 Imaging   1. Possible new omental nodule. 2. Improving abdominal and pelvic retroperitoneal adenopathy. 3. Stable pulmonary and hepatic  metastatic disease. 4. Left perirectal/presacral fluid collection has decreased in size. 5. Right inguinal hernia contains a small portion of the bladder, as before. 6. Aortic atherosclerosis (ICD10-170.0). Coronary artery calcification   12/21/2018 Tumor Marker   Patient's tumor was tested for the following markers: CA-125 Results of the tumor marker test revealed 15.6   01/15/2019 Imaging   Ct abdomen and pelvis 1. Small hiatal hernia with fluid in the distal esophagus, which may represent reflux or esophagitis. No other acute findings. 2. Right inguinal hernia contains anterolateral aspect of the urinary bladder, unchanged in appearance from prior. Mild edema within the hernia sac is similar. 3. Unchanged small low-density lesions in the liver. 4. Unchanged retroperitoneal and bilateral iliac lymph nodes. Small ventral abdominal omental nodule tentatively identified and unchanged from prior.   Aortic Atherosclerosis (ICD10-I70.0).   02/02/2019 Tumor Marker   Patient's tumor was tested for the following markers: CA-125 Results of the tumor marker test revealed 7.8   03/22/2019 Surgery   Preoperative diagnosis: History of incarcerated right inguinal hernia   Postop diagnosis: Incarcerated right inguinal hernia with preperitoneal fat without strangulation or obstruction   Procedure: Repair of right inguinal hernia with ultra Pro hernia system mesh   Surgeon: Erroll Luna, MD   03/31/2019 Tumor Marker   Patient's tumor was tested for the following markers: CA-`125 Results of the tumor marker test revealed 24.4.   05/25/2019 Tumor Marker   Patient's tumor was tested for the following markers: CA-125 Results of the tumor marker test revealed 287   05/25/2019 Imaging   New solid bilateral adnexal masses, consistent with metastatic disease.   New peritoneal soft tissue nodules in the anterior right pelvis, suspicious for peritoneal carcinomatosis. No evidence of ascites.   No  significant change in shotty sub-cm abdominal retroperitoneal and bilateral iliac lymph nodes.   06/09/2019 -  Chemotherapy   The patient had pembrolizumab (KEYTRUDA) 200 mg in sodium chloride 0.9 % 50 mL chemo infusion, 200 mg, Intravenous, Once, 0 of 6 cycles  for chemotherapy treatment.      REVIEW OF SYSTEMS:   Constitutional: Denies fevers, chills or abnormal weight loss Eyes: Denies blurriness of vision Ears, nose, mouth, throat, and face: Denies mucositis or sore throat Respiratory: Denies cough, dyspnea or wheezes Cardiovascular: Denies palpitation, chest discomfort or lower extremity swelling Gastrointestinal:  Denies nausea, heartburn or change in bowel habits Skin: Denies abnormal skin rashes Lymphatics: Denies new lymphadenopathy or easy bruising Neurological:Denies numbness, tingling or new weaknesses Behavioral/Psych: Mood is stable, no new changes  All other systems were reviewed with the patient and are negative.  I have reviewed the past medical history, past surgical history, social history and family history with the patient and they are unchanged from previous note.  ALLERGIES:  has No Known Allergies.  MEDICATIONS:  Current Outpatient Medications  Medication Sig Dispense Refill  . acetaminophen (TYLENOL) 500 MG tablet Take 1 tablet (500 mg total) by mouth every 6 (six) hours as needed. (Patient taking differently: Take 1,000 mg by mouth every 6 (six) hours as needed  for moderate pain. ) 30 tablet 3  . Calcium Carbonate (CALTRATE 600 PO) Take 1 tablet by mouth 2 (two) times daily.     . enalapril (VASOTEC) 20 MG tablet TAKE 1 TABLET(20 MG) BY MOUTH TWICE DAILY (Patient taking differently: Take 20 mg by mouth daily. ) 180 tablet 2  . Fluticasone-Salmeterol (ADVAIR DISKUS) 100-50 MCG/DOSE AEPB USE 1 INHALATION BY MOUTH TWICE DAILY 60 each 2  . furosemide (LASIX) 40 MG tablet TAKE 1 TABLET BY MOUTH DAILY AS NEEDED FOR LEG SWELLING (Patient taking differently: Take 40 mg  by mouth daily as needed (leg swelling). ) 90 tablet 2  . hydrochlorothiazide (HYDRODIURIL) 25 MG tablet TAKE 1 TABLET(25 MG) BY MOUTH DAILY (Patient taking differently: Take 25 mg by mouth daily. ) 90 tablet 1  . lenvatinib 10 mg daily dose (LENVIMA) capsule Take 1 capsule (10 mg total) by mouth daily. 30 capsule 11  . lidocaine-prilocaine (EMLA) cream Apply to affected area once (Patient taking differently: Apply 1 application topically daily as needed. Apply to affected area once) 30 g 3  . Multiple Vitamin (MULTIVITAMIN) tablet Take 1 tablet by mouth daily.    . Multiple Vitamins-Minerals (ICAPS AREDS 2 PO) Take 1 capsule by mouth 2 (two) times daily.    . ondansetron (ZOFRAN) 8 MG tablet Take 1 tablet (8 mg total) by mouth every 8 (eight) hours as needed. Start on the third day after chemotherapy. 30 tablet 1  . oxyCODONE (OXY IR/ROXICODONE) 5 MG immediate release tablet Take 1 tablet (5 mg total) by mouth every 6 (six) hours as needed for severe pain. 15 tablet 0  . polyethylene glycol (MIRALAX / GLYCOLAX) 17 g packet Take 17 g by mouth daily as needed for moderate constipation.    . prochlorperazine (COMPAZINE) 10 MG tablet Take 1 tablet (10 mg total) by mouth every 6 (six) hours as needed (Nausea or vomiting). (Patient not taking: Reported on 02/07/2019) 30 tablet 1  . rivaroxaban (XARELTO) 20 MG TABS tablet Take 1 tablet (20 mg total) by mouth daily with supper. 90 tablet 1  . Vitamin D, Cholecalciferol, 1000 units CAPS Take 1,000 Units by mouth daily.      No current facility-administered medications for this visit.    PHYSICAL EXAMINATION: ECOG PERFORMANCE STATUS: 1 - Symptomatic but completely ambulatory  Vitals:   05/31/19 1126  BP: (!) 154/68  Pulse: 95  Resp: 16  Temp: 98.3 F (36.8 C)  SpO2: 100%   Filed Weights   05/31/19 1126  Weight: 255 lb 14.4 oz (116.1 kg)    GENERAL:alert, no distress and comfortable Musculoskeletal:no cyanosis of digits and no clubbing  NEURO:  alert & oriented x 3 with fluent speech, no focal motor/sensory deficits  LABORATORY DATA:  I have reviewed the data as listed    Component Value Date/Time   NA 139 05/25/2019 0940   NA 142 12/25/2014 1122   K 4.1 05/25/2019 0940   K 4.1 12/25/2014 1122   CL 104 05/25/2019 0940   CL 102 10/04/2012 1159   CO2 25 05/25/2019 0940   CO2 29 12/25/2014 1122   GLUCOSE 94 05/25/2019 0940   GLUCOSE 99 12/25/2014 1122   GLUCOSE 116 (H) 10/04/2012 1159   BUN 22 05/25/2019 0940   BUN 13.7 12/25/2014 1122   CREATININE 1.13 (H) 05/25/2019 0940   CREATININE 1.08 (H) 12/02/2018 1432   CREATININE 0.96 (H) 02/27/2016 1530   CREATININE 0.9 12/25/2014 1122   CALCIUM 9.4 05/25/2019 0940   CALCIUM 10.1  12/25/2014 1122   PROT 8.1 05/25/2019 0940   PROT 7.8 12/25/2014 1122   ALBUMIN 3.6 05/25/2019 0940   ALBUMIN 3.4 (L) 12/25/2014 1122   AST 17 05/25/2019 0940   AST 22 12/02/2018 1432   AST 21 12/25/2014 1122   ALT 6 05/25/2019 0940   ALT 13 12/02/2018 1432   ALT 17 12/25/2014 1122   ALKPHOS 67 05/25/2019 0940   ALKPHOS 99 12/25/2014 1122   BILITOT 0.3 05/25/2019 0940   BILITOT <0.3 (L) 12/02/2018 1432   BILITOT 0.41 12/25/2014 1122   GFRNONAA 46 (L) 05/25/2019 0940   GFRNONAA 49 (L) 12/02/2018 1432   GFRNONAA 58 (L) 02/27/2016 1530   GFRAA 54 (L) 05/25/2019 0940   GFRAA 57 (L) 12/02/2018 1432   GFRAA 66 02/27/2016 1530    No results found for: SPEP, UPEP  Lab Results  Component Value Date   WBC 7.7 05/25/2019   NEUTROABS 5.8 05/25/2019   HGB 9.4 (L) 05/25/2019   HCT 29.1 (L) 05/25/2019   MCV 100.7 (H) 05/25/2019   PLT 231 05/25/2019      Chemistry      Component Value Date/Time   NA 139 05/25/2019 0940   NA 142 12/25/2014 1122   K 4.1 05/25/2019 0940   K 4.1 12/25/2014 1122   CL 104 05/25/2019 0940   CL 102 10/04/2012 1159   CO2 25 05/25/2019 0940   CO2 29 12/25/2014 1122   BUN 22 05/25/2019 0940   BUN 13.7 12/25/2014 1122   CREATININE 1.13 (H) 05/25/2019 0940    CREATININE 1.08 (H) 12/02/2018 1432   CREATININE 0.96 (H) 02/27/2016 1530   CREATININE 0.9 12/25/2014 1122      Component Value Date/Time   CALCIUM 9.4 05/25/2019 0940   CALCIUM 10.1 12/25/2014 1122   ALKPHOS 67 05/25/2019 0940   ALKPHOS 99 12/25/2014 1122   AST 17 05/25/2019 0940   AST 22 12/02/2018 1432   AST 21 12/25/2014 1122   ALT 6 05/25/2019 0940   ALT 13 12/02/2018 1432   ALT 17 12/25/2014 1122   BILITOT 0.3 05/25/2019 0940   BILITOT <0.3 (L) 12/02/2018 1432   BILITOT 0.41 12/25/2014 1122       RADIOGRAPHIC STUDIES: I have reviewed multiple imaging studies with the patient and her daughter I have personally reviewed the radiological images as listed and agreed with the findings in the report. CT ABDOMEN PELVIS W CONTRAST  Result Date: 05/25/2019 CLINICAL DATA:  Follow-up endometrial carcinoma. Undergoing chemotherapy. EXAM: CT ABDOMEN AND PELVIS WITH CONTRAST TECHNIQUE: Multidetector CT imaging of the abdomen and pelvis was performed using the standard protocol following bolus administration of intravenous contrast. CONTRAST:  173m OMNIPAQUE IOHEXOL 300 MG/ML  SOLN COMPARISON:  02/07/2019 FINDINGS: Lower Chest: No acute findings. Hepatobiliary: No hepatic masses identified. Several tiny hepatic cysts remains stable. Gallbladder is unremarkable. No evidence of biliary ductal dilatation. Pancreas:  No mass or inflammatory changes. Spleen: Within normal limits in size and appearance. Adrenals/Urinary Tract: No masses identified. Stable small cyst in upper pole of left kidney. No evidence of hydronephrosis. Stomach/Bowel: No evidence of obstruction, inflammatory process or abnormal fluid collections. Vascular/Lymphatic: Sub-cm retroperitoneal and bilateral iliac lymph nodes show no significant change. No pathologically enlarged lymph nodes. No abdominal aortic aneurysm. Aortic atherosclerosis incidentally noted. Reproductive: Uterus is unremarkable. New solid bilateral adnexal masses  are seen since prior study. Right adnexal mass measures 8.5 x 5.3 cm, and left adnexal mass measures 6.6 x 3.8 cm. No evidence of ascites. Other: There  has been repair of right inguinal hernia since previous study. Multiple small less than 1 cm peritoneal soft tissue nodules are seen in the right anterior pelvis which are new since previous study and suspicious for peritoneal carcinoma. No evidence of ascites. Musculoskeletal:  No suspicious bone lesions identified. IMPRESSION: New solid bilateral adnexal masses, consistent with metastatic disease. New peritoneal soft tissue nodules in the anterior right pelvis, suspicious for peritoneal carcinomatosis. No evidence of ascites. No significant change in shotty sub-cm abdominal retroperitoneal and bilateral iliac lymph nodes. Electronically Signed   By: Marlaine Hind M.D.   On: 05/25/2019 13:37    All questions were answered. The patient knows to call the clinic with any problems, questions or concerns. No barriers to learning was detected.  I spent 40 minutes counseling the patient face to face. The total time spent in the appointment was 55 minutes and more than 50% was on counseling and review of test results  [name], MD 05/31/2019 2:41 PM

## 2019-05-31 NOTE — Telephone Encounter (Signed)
Scheduled appt per 12/29 sch message - pt is aware of appt date and time  

## 2019-05-31 NOTE — Assessment & Plan Note (Signed)
She is at risk of uncontrolled hypertension while on lenvatinib She is instructed to check her blood pressure twice a day and I will see her next week before she starts treatment

## 2019-05-31 NOTE — Assessment & Plan Note (Signed)
We have numerous goals of care in the past The patient would like to be treated We discussed CODE STATUS and she wants to be resuscitated She nominated her daughter as healthcare power of attorney

## 2019-05-31 NOTE — Assessment & Plan Note (Signed)
I have reviewed her blood work and CT imaging with the patient and her daughter Unfortunately, she has signs of disease progression Fortunately, she is not symptomatic She is interested to begin palliative treatment  We reviewed the current guidelines Goal is palliative I recommend switching her treatment with Lenvima and pembrolizumab.   The combination was approved following review conducted under [employer], an intiative of the [employer] which provides a framework for concurrent submission and review of oncology drugs among international partners. The FDA approved the combination with the [place] Therapeutic Goods Administration and [facility] [place].   Efficacy of the drugs together was investigated in Study 111/KEYNOTE-146 ([id number]), a single-arm, multicenter, open-label, multi-cohort trial that enrolled 108 patients with metastatic endometrial cancer that had progressed following at least one prior systemic therapy in any setting.  Patients took 20 mg of lenvatinib orally once daily in combination with 200 mg of pembrolizumab administered intravenously every 3 weeks until unacceptable toxicity or disease progression.  Among the 108 patients, 94 had tumors that were not MSI-H or dMMR, 11 had tumors that were MSI-H or dMMR, and in 3 patients the tumor MSI-H or dMMR status was not known.  Results  The major efficacy outcome measures were objective response rate (ORR) and duration of response (DOR) by independent radiologic review committee using RECIST 1.1. The ORR in the 94 patients whose tumors were not MSI-H or dMMR was 38.3% (95% CI: 29%, 49%) with 10 complete responses (10.6%) and 26 partial responses (27.7%). Median DOR was not reached at the time of data cutoff and 25 patients (69% of responders) had response durations ?6 months.  In another updated publication published on JCO: DOI: 10.1200/JCO.19.02627 Journal of Clinical Oncology, Published online August 13, 2018. Lenvatinib Plus Pembrolizumab in Patients With Advanced Endometrial Cancer Abstract PURPOSE  Patients with advanced endometrial carcinoma have limited treatment options. We report final primary efficacy analysis results for a patient cohort with advanced endometrial carcinoma receiving lenvatinib plus pembrolizumab in an ongoing phase Ib/II study of selected solid tumors. METHODS  Patients took lenvatinib 20 mg once daily orally plus pembrolizumab 200 mg intravenously once every 3 weeks, in 3-week cycles. The primary end point was objective response rate (ORR) at 24 weeks (LDJTT01); secondary efficacy end points included duration of response (DOR), progression-free survival (PFS), and overall survival (OS). Tumor assessments were evaluated by investigators per immune-related RECIST.  RESULTS  At data cutoff, 108 patients with previously treated endometrial carcinoma were enrolled, with a median follow-up of 18.7 months. The XBLTJ03 was 38.0% (95% CI, 28.8% to 47.8%). Among subgroups, the ESPQZ30 (95% CI) was 63.6% (30.8% to 89.1%) in patients with microsatellite instability (MSI)-high tumors (n = 11) and 36.2% (26.5% to 46.7%) in patients with microsatellite-stable tumors (n = 94). For previously treated patients, regardless of tumor MSI status, the median DOR was 21.2 months (95% CI, 7.6 months to not estimable), median PFS was 7.4 months (95% CI, 5.3 to 8.7 months), and median OS was 16.7 months (15.0 months to not estimable). Grade 3 or 4 treatment-related adverse events occurred in 83/124 (66.9%) patients. CONCLUSION  Lenvatinib plus pembrolizumab showed promising antitumor activity in patients with advanced endometrial carcinoma who have experienced disease progression after prior systemic therapy, regardless of tumor MSI status. The combination therapy had a manageable toxicity profile. The most common adverse reactions for endometrial cancer were fatigue, hypertension, musculoskeletal pain,  diarrhea, decreased appetite, hypothyroidism, nausea, stomatitis, vomiting, decreased weight, abdominal pain, headache, constipation, urinary tract infection,  dysphonia, hemorrhagic events, hypomagnesemia, palmar-plantar erythrodysesthesia, dyspnea, cough, and rash.   The most common adverse reactions for endometrial cancer were fatigue, hypertension, musculoskeletal pain, diarrhea, decreased appetite, hypothyroidism, nausea, stomatitis, vomiting, decreased weight, abdominal pain, headache, constipation, urinary tract infection, dysphonia, hemorrhagic events, hypomagnesemia, palmar-plantar erythrodysesthesia, dyspnea, cough, and rash.   After much discussion, the patient would like to proceed with the plan of care. I recommend she start checking her blood pressure twice a day I will see her next week before she start treatment due to high risk of anticipated complications especially in regards to uncontrolled hypertension Due to her age and baseline chronic kidney disease, I plan to reduce lenvatinib to 10 mg daily

## 2019-05-31 NOTE — Telephone Encounter (Signed)
Refill request

## 2019-05-31 NOTE — Telephone Encounter (Signed)
Oral Oncology Patient Advocate Encounter  After completing a benefits investigation, prior authorization for Lenvima is not required at this time through [place].  Patient's copay is $3.90.  [name] [name] Phone [phone] Fax [phone] 05/31/2019 3:53 PM

## 2019-05-31 NOTE — Telephone Encounter (Signed)
Oral Oncology Pharmacist Encounter  Received new prescription for Lenvima (lenvatinib) for the treatment of uterine cancer in conjunction with pembrolizumab, planned duration until disease progression or unacceptable drug toxicity. Planned start 06/08/2018  CMP from 05/25/2019 assessed, no relevant lab abnormalities. Orders for urine protein and TSH entered. BP at 05/31/2019 office visit slightly elevated. Continue to monitor BP for the need to further optimize therapy. Prescription dose and frequency assessed.   Current medication list in Epic reviewed, no relevant DDIs with lenvatinib identified.  Prescription has been e-scribed to the [facility] for benefits analysis and approval.  Oral Oncology Clinic will continue to follow for insurance authorization, copayment issues, initial counseling and start date.  Darl Pikes, PharmD, BCPS, [facility] Hematology/Oncology Clinical Pharmacist [facility] [place] [facility] [phone]  05/31/2019 3:06 PM

## 2019-06-01 NOTE — Telephone Encounter (Signed)
Oral Chemotherapy Pharmacist Encounter  Ms. Nalaya Pettaway will be delivered by [place] on 06/07/19. The plan is for her to start on 06/09/19 along with her pembrolizumab. Spoke with her daughter Arnette Schaumann for education, she stated her understanding of the start plan.  Patient Education I spoke with [name] for overview of new oral chemotherapy medication: Lenvima (lenvatinib) for the treatment of uterine cancer in conjunction with pembrolizumab, planned duration until disease progression or unacceptable drug toxicity. Planned start 06/08/2018  Counseled [name] on administration, dosing, side effects, monitoring, drug-food interactions, safe handling, storage, and disposal. Patient will take 1 capsule (10 mg total) by mouth daily.  Side effects include but not limited to: increased BP, diarrhea, fatigue, N/V, hand-foot syndrome.    Reviewed with patient importance of keeping a medication schedule and plan for any missed doses.  [name] voiced understanding and appreciation. All questions answered. Medication handout placed in the mail.  Provided patient with Oral [place] [facility] phone number. Patient knows to call the office with questions or concerns. Oral Chemotherapy Navigation Clinic will continue to follow.  Darl Pikes, PharmD, BCPS, [facility] Hematology/Oncology Clinical Pharmacist [facility] [place] [facility] [phone]  06/01/2019 3:48 PM

## 2019-06-02 ENCOUNTER — Telehealth: Payer: Self-pay

## 2019-06-02 MED FILL — LENVIMA 10 MG DAILY DOSE: 10 | 30 days supply | Qty: 30 | Fill #0

## 2019-06-06 ENCOUNTER — Other Ambulatory Visit: Payer: Self-pay

## 2019-06-06 ENCOUNTER — Ambulatory Visit (INDEPENDENT_AMBULATORY_CARE_PROVIDER_SITE_OTHER): Payer: Medicare Other

## 2019-06-06 ENCOUNTER — Encounter: Payer: Self-pay | Admitting: Podiatry

## 2019-06-06 ENCOUNTER — Ambulatory Visit (INDEPENDENT_AMBULATORY_CARE_PROVIDER_SITE_OTHER): Payer: Medicare Other | Admitting: Podiatry

## 2019-06-06 DIAGNOSIS — M779 Enthesopathy, unspecified: Secondary | ICD-10-CM

## 2019-06-06 DIAGNOSIS — M21611 Bunion of right foot: Secondary | ICD-10-CM | POA: Diagnosis not present

## 2019-06-06 DIAGNOSIS — L84 Corns and callosities: Secondary | ICD-10-CM

## 2019-06-06 DIAGNOSIS — M21619 Bunion of unspecified foot: Secondary | ICD-10-CM | POA: Diagnosis not present

## 2019-06-06 DIAGNOSIS — M21612 Bunion of left foot: Secondary | ICD-10-CM

## 2019-06-06 NOTE — Progress Notes (Signed)
Subjective:   Patient ID: Erin Esparza, female   DOB: 79 y.o.   MRN: [id number]   HPI Patient presents with significant inflammation pain around the right first MPJ and states she had bunion formation bilateral.  Also has a corn is painful on the right fifth digit has tried medicated corn pads with mild relief   ROS      Objective:  Physical Exam  Neurovascular status intact with inflammation fluid around the right first MPJ with structural bunion deformity noted and keratotic lesion fifth digit right that is painful     Assessment:  Possibility for gout or inflammatory capsulitis first MPJ right with structural bunion deformity noted right and also keratotic lesion hammertoe deformity fifth right      Plan:  H&P all conditions reviewed explained and I discussed bunion and the possibility for surgery in the future.  Today I went ahead and I did inject the first MPJ 3 mg Kenalog 5 g Xylocaine discussed possibility for gout and also debrided lesion fifth digit right advised on wider shoes and will reappoint as needed and may require medication depending on response  X-rays indicate significant structural bunion deformity right with spur formation with no other significant pathology noted

## 2019-06-09 ENCOUNTER — Inpatient Hospital Stay (HOSPITAL_BASED_OUTPATIENT_CLINIC_OR_DEPARTMENT_OTHER): Payer: Medicare Other | Admitting: Hematology and Oncology

## 2019-06-09 ENCOUNTER — Encounter: Payer: Self-pay | Admitting: Oncology

## 2019-06-09 ENCOUNTER — Other Ambulatory Visit: Payer: Self-pay

## 2019-06-09 ENCOUNTER — Inpatient Hospital Stay: Payer: Medicare Other | Attending: Gynecologic Oncology

## 2019-06-09 ENCOUNTER — Encounter: Payer: Self-pay | Admitting: Hematology and Oncology

## 2019-06-09 ENCOUNTER — Inpatient Hospital Stay: Payer: Medicare Other

## 2019-06-09 DIAGNOSIS — I10 Essential (primary) hypertension: Secondary | ICD-10-CM | POA: Diagnosis not present

## 2019-06-09 DIAGNOSIS — T451X5A Adverse effect of antineoplastic and immunosuppressive drugs, initial encounter: Secondary | ICD-10-CM | POA: Diagnosis not present

## 2019-06-09 DIAGNOSIS — Z79899 Other long term (current) drug therapy: Secondary | ICD-10-CM | POA: Diagnosis not present

## 2019-06-09 DIAGNOSIS — D6481 Anemia due to antineoplastic chemotherapy: Secondary | ICD-10-CM

## 2019-06-09 DIAGNOSIS — D631 Anemia in chronic kidney disease: Secondary | ICD-10-CM | POA: Insufficient documentation

## 2019-06-09 DIAGNOSIS — Z7901 Long term (current) use of anticoagulants: Secondary | ICD-10-CM | POA: Diagnosis not present

## 2019-06-09 DIAGNOSIS — N183 Chronic kidney disease, stage 3 unspecified: Secondary | ICD-10-CM | POA: Insufficient documentation

## 2019-06-09 DIAGNOSIS — C55 Malignant neoplasm of uterus, part unspecified: Secondary | ICD-10-CM

## 2019-06-09 DIAGNOSIS — Z170 Estrogen receptor positive status [ER+]: Secondary | ICD-10-CM | POA: Diagnosis not present

## 2019-06-09 DIAGNOSIS — Z5112 Encounter for antineoplastic immunotherapy: Secondary | ICD-10-CM | POA: Insufficient documentation

## 2019-06-09 DIAGNOSIS — Z79811 Long term (current) use of aromatase inhibitors: Secondary | ICD-10-CM | POA: Diagnosis not present

## 2019-06-09 DIAGNOSIS — C50212 Malignant neoplasm of upper-inner quadrant of left female breast: Secondary | ICD-10-CM | POA: Diagnosis not present

## 2019-06-09 DIAGNOSIS — C541 Malignant neoplasm of endometrium: Secondary | ICD-10-CM | POA: Diagnosis present

## 2019-06-09 DIAGNOSIS — I129 Hypertensive chronic kidney disease with stage 1 through stage 4 chronic kidney disease, or unspecified chronic kidney disease: Secondary | ICD-10-CM | POA: Diagnosis not present

## 2019-06-09 DIAGNOSIS — Z86718 Personal history of other venous thrombosis and embolism: Secondary | ICD-10-CM | POA: Diagnosis not present

## 2019-06-09 LAB — CBC WITH DIFFERENTIAL (CANCER CENTER ONLY)
Abs Immature Granulocytes: 0.02 10*3/uL (ref 0.00–0.07)
Basophils Absolute: 0 10*3/uL (ref 0.0–0.1)
Basophils Relative: 0 %
Eosinophils Absolute: 0.2 10*3/uL (ref 0.0–0.5)
Eosinophils Relative: 2 %
HCT: 28.7 % — ABNORMAL LOW (ref 36.0–46.0)
Hemoglobin: 9.5 g/dL — ABNORMAL LOW (ref 12.0–15.0)
Immature Granulocytes: 0 %
Lymphocytes Relative: 12 %
Lymphs Abs: 1 10*3/uL (ref 0.7–4.0)
MCH: 32.8 pg (ref 26.0–34.0)
MCHC: 33.1 g/dL (ref 30.0–36.0)
MCV: 99 fL (ref 80.0–100.0)
Monocytes Absolute: 0.9 10*3/uL (ref 0.1–1.0)
Monocytes Relative: 10 %
Neutro Abs: 6.7 10*3/uL (ref 1.7–7.7)
Neutrophils Relative %: 76 %
Platelet Count: 236 10*3/uL (ref 150–400)
RBC: 2.9 MIL/uL — ABNORMAL LOW (ref 3.87–5.11)
RDW: 13 % (ref 11.5–15.5)
WBC Count: 8.8 10*3/uL (ref 4.0–10.5)
nRBC: 0 % (ref 0.0–0.2)

## 2019-06-09 LAB — CMP (CANCER CENTER ONLY)
ALT: 8 U/L (ref 0–44)
AST: 17 U/L (ref 15–41)
Albumin: 3.5 g/dL (ref 3.5–5.0)
Alkaline Phosphatase: 74 U/L (ref 38–126)
Anion gap: 10 (ref 5–15)
BUN: 25 mg/dL — ABNORMAL HIGH (ref 8–23)
CO2: 26 mmol/L (ref 22–32)
Calcium: 9.6 mg/dL (ref 8.9–10.3)
Chloride: 104 mmol/L (ref 98–111)
Creatinine: 1.26 mg/dL — ABNORMAL HIGH (ref 0.44–1.00)
GFR, Est AFR Am: 47 mL/min — ABNORMAL LOW (ref >60)
GFR, Estimated: 40 mL/min — ABNORMAL LOW (ref >60)
Glucose, Bld: 96 mg/dL (ref 70–99)
Potassium: 4.4 mmol/L (ref 3.5–5.1)
Sodium: 140 mmol/L (ref 135–145)
Total Bilirubin: 0.3 mg/dL (ref 0.3–1.2)
Total Protein: 8 g/dL (ref 6.5–8.1)

## 2019-06-09 LAB — TOTAL PROTEIN, URINE DIPSTICK: Protein, ur: <30 mg/dL

## 2019-06-09 LAB — TSH: TSH: 3.808 u[IU]/mL (ref 0.308–3.960)

## 2019-06-09 MED ORDER — SODIUM CHLORIDE 0.9 % IV SOLN
Freq: Once | INTRAVENOUS | Status: AC
  Filled 2019-06-09: qty 250

## 2019-06-09 MED ORDER — SODIUM CHLORIDE 0.9 % IV SOLN
200.0000 mg | Freq: Once | INTRAVENOUS | Status: AC
  Administered 2019-06-09: 11:00:00 200 mg via INTRAVENOUS
  Filled 2019-06-09: qty 8

## 2019-06-09 MED ORDER — HEPARIN SOD (PORK) LOCK FLUSH 100 UNIT/ML IV SOLN
500.0000 [IU] | Freq: Once | INTRAVENOUS | Status: AC | PRN
  Administered 2019-06-09: 12:00:00 500 [IU]
  Filled 2019-06-09: qty 5

## 2019-06-09 MED ORDER — SODIUM CHLORIDE 0.9% FLUSH
10.0000 mL | INTRAVENOUS | Status: DC | PRN
  Administered 2019-06-09: 10 mL
  Filled 2019-06-09: qty 10

## 2019-06-09 NOTE — Assessment & Plan Note (Signed)
She has chronic baseline anemia due to chronic kidney disease She is mildly symptomatic Recommend observation only for now 

## 2019-06-09 NOTE — Progress Notes (Signed)
[place] OFFICE PROGRESS NOTE  Patient Care Team: [name], DO as PCP - General (Family Medicine) [name], MD as Consulting Physician (Ophthalmology) [name], DPM as Consulting Physician (Podiatry) [name], [name], MD as Referring Physician (Optometry) [name], MD (Inactive) as Consulting Physician (Orthopedic Surgery) [name], RN as Oncology Nurse Navigator (Oncology)  ASSESSMENT & PLAN:  Uterine cancer [facility]) From my last visit, we have discussed the risk, benefits, side effects of combination treatment with pembrolizumab and lenvatinib She is in agreement to proceed She will start both today I am concerned about uncontrolled hypertension on lenvatinib even though I dose reduce her upfront I will call her next week to check on her blood pressure control   Essential hypertension She has baseline poorly controlled hypertension She is taking hydrochlorothiazide and enalapril She is instructed to continue the same and to take her blood pressure measurement twice a day moving forward I will call her early next week to check on her blood pressure control  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   INTERVAL HISTORY: Please see below for problem oriented charting. 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 Ultrasound irregular hypoechoic mass 7 x 6  x 5 mm   01/06/2012 Initial Biopsy   Initial biopsy was benign . 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 Cervix, biopsy - HIGH GRADE CARCINOMA. - SEE COMMENT. 2. Endocervix, curettage - HIGH GRADE CARCINOMA. - SEE COMMENT. 3. Endometrium, biopsy - HIGH GRADE CARCINOMA. - SEE COMMENT. Microscopic Comment 1. - 3. The carcinoma in the three specimens is morphologically similar and has features suggestive of papillary serous carcinoma.   07/26/2018 Tumor Marker   Patient's tumor was tested for the following markers: CA-125 Results of the tumor marker test revealed 835   07/28/2018 Imaging   1. Several (at least 10) solid pulmonary nodules scattered throughout both lungs are new since 2012 chest CT, likely representing pulmonary metastases, largest 6 mm, below PET resolution. 2. Redemonstration of hypodense 2.5 cm segment 7 right liver lobe mass, indeterminate, suspicious for liver metastasis. 3. 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 4. Three-vessel coronary atherosclerosis.  Aortic Atherosclerosis (ICD10-I70.0).   08/03/2018 Cancer Staging   Staging form: Corpus Uteri - Carcinoma and Carcinosarcoma, AJCC 8th Edition - Clinical: FIGO Stage IVB (cT3b, cN2, cM1) -  Signed by [name], MD on 08/03/2018   08/05/2018 Tumor Marker   Patient's tumor was tested for the following markers: CA-125 Results of the tumor marker test revealed 901   08/06/2018 Procedure   Placement of single lumen port a cath via right internal jugular vein. The catheter tip lies at the cavo-atrial junction. A power injectable port a cath was placed and is ready for immediate use.    08/11/2018 - 01/12/2019 Chemotherapy   The patient had carboplatin and taxol x 8 cycles   09/01/2018 Tumor Marker   Patient's tumor was tested for the following markers: CA-125 Results of the tumor marker test revealed 406   09/09/2018 Genetic Testing   The Common Hereditary Cancers Panel offered by [facility] includes sequencing and/or deletion duplication testing of the following 48 genes: APC, ATM, AXIN2, BARD1, BMPR1A, BRCA1, BRCA2, BRIP1, CDH1, CDKN2A (p14ARF), CDKN2A (p16INK4a), CKD4, CHEK2, CTNNA1, DICER1, EPCAM (Deletion/duplication testing only), GREM1 (promoter region deletion/duplication testing only), KIT, MEN1, MLH1, MSH2, MSH3, MSH6, MUTYH, NBN, NF1, NHTL1, PALB2, PDGFRA, PMS2, POLD1, POLE, PTEN, RAD50, RAD51C, RAD51D, RNF43, SDHB, SDHC, SDHD, SMAD4, SMARCA4. STK11, TP53, TSC1, TSC2, and VHL.  The following genes were evaluated for sequence changes only: SDHA and HOXB13 c.251G>A variant only.  Results: Negative, no pathogenic variants identified. The report date is 09/09/2018.     09/22/2018 Tumor Marker   Patient's tumor was tested for the following markers: CA-125 Results of the tumor marker test revealed 110   10/12/2018 Tumor Marker   Patient's tumor was tested for the following markers: CA-125 Results of the tumor marker test revealed 55.3   10/12/2018 Imaging   1. Mild decrease in pulmonary metastases, liver metastases, and metastatic abdominal and pelvic lymphadenopathy. 2. Decreased size of complex cystic lesion in left presacral region. 3. No new or progressive metastatic disease  identified. 4. Stable small right inguinal hernia containing small portion of urinary bladder.   11/03/2018 Tumor Marker   Patient's tumor was tested for the following markers: CA-125 Results of the tumor marker test revealed 34.3    Genetic Testing   Patient has genetic testing done for HER2. Results revealed patient has the following: HER2 - negative from pathology from 07/26/18.   12/20/2018 Imaging   1. Possible new omental nodule. 2. Improving abdominal and pelvic retroperitoneal adenopathy. 3. Stable pulmonary and hepatic metastatic disease. 4. Left perirectal/presacral fluid collection has decreased in size. 5. Right inguinal hernia contains a small portion of the bladder, as before. 6. Aortic atherosclerosis (ICD10-170.0). Coronary artery calcification   12/21/2018 Tumor Marker   Patient's tumor was tested for the following markers: CA-125 Results of the tumor marker test revealed 15.6   01/15/2019 Imaging   Ct abdomen and pelvis 1. Small hiatal hernia with fluid in the distal esophagus, which may represent reflux or esophagitis. No other acute findings. 2. Right inguinal hernia contains anterolateral aspect of the urinary bladder, unchanged in appearance from prior. Mild edema within the hernia sac is similar. 3. Unchanged small low-density lesions in the liver. 4. Unchanged retroperitoneal and bilateral iliac lymph nodes. Small ventral abdominal omental nodule tentatively identified and unchanged from prior.   Aortic Atherosclerosis (ICD10-I70.0).   02/02/2019 Tumor Marker   Patient's tumor was tested for the following markers: CA-125 Results of the tumor marker test revealed 7.8   03/22/2019  Surgery   Preoperative diagnosis: History of incarcerated right inguinal hernia   Postop diagnosis: Incarcerated right inguinal hernia with preperitoneal fat without strangulation or obstruction   Procedure: Repair of right inguinal hernia with ultra Pro hernia system mesh    Surgeon: Erroll Luna, MD   03/31/2019 Tumor Marker   Patient's tumor was tested for the following markers: CA-`125 Results of the tumor marker test revealed 24.4.   05/25/2019 Tumor Marker   Patient's tumor was tested for the following markers: CA-125 Results of the tumor marker test revealed 287   05/25/2019 Imaging   New solid bilateral adnexal masses, consistent with metastatic disease.   New peritoneal soft tissue nodules in the anterior right pelvis, suspicious for peritoneal carcinomatosis. No evidence of ascites.   No significant change in shotty sub-cm abdominal retroperitoneal and bilateral iliac lymph nodes.   06/09/2019 -  Chemotherapy   The patient had pembrolizumab (KEYTRUDA) 200 mg in sodium chloride 0.9 % 50 mL chemo infusion, 200 mg, Intravenous, Once, 0 of 6 cycles  for chemotherapy treatment.      REVIEW OF SYSTEMS:   Constitutional: Denies fevers, chills or abnormal weight loss Eyes: Denies blurriness of vision Ears, nose, mouth, throat, and face: Denies mucositis or sore throat Respiratory: Denies cough, dyspnea or wheezes Cardiovascular: Denies palpitation, chest discomfort or lower extremity swelling Gastrointestinal:  Denies nausea, heartburn or change in bowel habits Skin: Denies abnormal skin rashes Lymphatics: Denies new lymphadenopathy or easy bruising Neurological:Denies numbness, tingling or new weaknesses Behavioral/Psych: Mood is stable, no new changes  All other systems were reviewed with the patient and are negative.  I have reviewed the past medical history, past surgical history, social history and family history with the patient and they are unchanged from previous note.  ALLERGIES:  has No Known Allergies.  MEDICATIONS:  Current Outpatient Medications  Medication Sig Dispense Refill  . acetaminophen (TYLENOL) 500 MG tablet Take 1 tablet (500 mg total) by mouth every 6 (six) hours as needed. (Patient taking differently: Take 1,000 mg  by mouth every 6 (six) hours as needed for moderate pain. ) 30 tablet 3  . Calcium Carbonate (CALTRATE 600 PO) Take 1 tablet by mouth 2 (two) times daily.     . enalapril (VASOTEC) 20 MG tablet TAKE 1 TABLET(20 MG) BY MOUTH TWICE DAILY (Patient taking differently: Take 20 mg by mouth daily. ) 180 tablet 2  . Fluticasone-Salmeterol (ADVAIR DISKUS) 100-50 MCG/DOSE AEPB USE 1 INHALATION BY MOUTH TWICE DAILY 60 each 2  . furosemide (LASIX) 40 MG tablet TAKE 1 TABLET BY MOUTH DAILY AS NEEDED FOR LEG SWELLING (Patient taking differently: Take 40 mg by mouth daily as needed (leg swelling). ) 90 tablet 2  . hydrochlorothiazide (HYDRODIURIL) 25 MG tablet TAKE 1 TABLET(25 MG) BY MOUTH DAILY (Patient taking differently: Take 25 mg by mouth daily. ) 90 tablet 1  . lenvatinib 10 mg daily dose (LENVIMA) capsule Take 1 capsule (10 mg total) by mouth daily. 30 capsule 11  . lidocaine-prilocaine (EMLA) cream Apply to affected area once (Patient taking differently: Apply 1 application topically daily as needed. Apply to affected area once) 30 g 3  . Multiple Vitamin (MULTIVITAMIN) tablet Take 1 tablet by mouth daily.    . Multiple Vitamins-Minerals (ICAPS AREDS 2 PO) Take 1 capsule by mouth 2 (two) times daily.    . ondansetron (ZOFRAN) 8 MG tablet Take 1 tablet (8 mg total) by mouth every 8 (eight) hours as needed. Start on the third day after  chemotherapy. 30 tablet 1  . oxyCODONE (OXY IR/ROXICODONE) 5 MG immediate release tablet Take 1 tablet (5 mg total) by mouth every 6 (six) hours as needed for severe pain. 15 tablet 0  . polyethylene glycol (MIRALAX / GLYCOLAX) 17 g packet Take 17 g by mouth daily as needed for moderate constipation.    . prochlorperazine (COMPAZINE) 10 MG tablet Take 1 tablet (10 mg total) by mouth every 6 (six) hours as needed (Nausea or vomiting). (Patient not taking: Reported on 02/07/2019) 30 tablet 1  . rivaroxaban (XARELTO) 20 MG TABS tablet Take 1 tablet (20 mg total) by mouth daily with  supper. 90 tablet 1  . Vitamin D, Cholecalciferol, 1000 units CAPS Take 1,000 Units by mouth daily.      No current facility-administered medications for this visit.    PHYSICAL EXAMINATION: ECOG PERFORMANCE STATUS: 0 - Asymptomatic  Vitals:   06/09/19 0920  BP: (!) 151/71  Pulse: 90  Resp: 15  Temp: 98 F (36.7 C)  SpO2: 100%   Filed Weights   06/09/19 0920  Weight: 252 lb (114.3 kg)    GENERAL:alert, no distress and comfortable SKIN: skin color, texture, turgor are normal, no rashes or significant lesions EYES: normal, Conjunctiva are pink and non-injected, sclera clear OROPHARYNX:no exudate, no erythema and lips, buccal mucosa, and tongue normal  NECK: supple, thyroid normal size, non-tender, without nodularity LYMPH:  no palpable lymphadenopathy in the cervical, axillary or inguinal LUNGS: clear to auscultation and percussion with normal breathing effort HEART: regular rate & rhythm and no murmurs with mild bilateral lower extremity edema ABDOMEN:abdomen soft, non-tender and normal bowel sounds Musculoskeletal:no cyanosis of digits and no clubbing  NEURO: alert & oriented x 3 with fluent speech, no focal motor/sensory deficits  LABORATORY DATA:  I have reviewed the data as listed    Component Value Date/Time   NA 139 05/25/2019 0940   NA 142 12/25/2014 1122   K 4.1 05/25/2019 0940   K 4.1 12/25/2014 1122   CL 104 05/25/2019 0940   CL 102 10/04/2012 1159   CO2 25 05/25/2019 0940   CO2 29 12/25/2014 1122   GLUCOSE 94 05/25/2019 0940   GLUCOSE 99 12/25/2014 1122   GLUCOSE 116 (H) 10/04/2012 1159   BUN 22 05/25/2019 0940   BUN 13.7 12/25/2014 1122   CREATININE 1.13 (H) 05/25/2019 0940   CREATININE 1.08 (H) 12/02/2018 1432   CREATININE 0.96 (H) 02/27/2016 1530   CREATININE 0.9 12/25/2014 1122   CALCIUM 9.4 05/25/2019 0940   CALCIUM 10.1 12/25/2014 1122   PROT 8.1 05/25/2019 0940   PROT 7.8 12/25/2014 1122   ALBUMIN 3.6 05/25/2019 0940   ALBUMIN 3.4 (L)  12/25/2014 1122   AST 17 05/25/2019 0940   AST 22 12/02/2018 1432   AST 21 12/25/2014 1122   ALT 6 05/25/2019 0940   ALT 13 12/02/2018 1432   ALT 17 12/25/2014 1122   ALKPHOS 67 05/25/2019 0940   ALKPHOS 99 12/25/2014 1122   BILITOT 0.3 05/25/2019 0940   BILITOT <0.3 (L) 12/02/2018 1432   BILITOT 0.41 12/25/2014 1122   GFRNONAA 46 (L) 05/25/2019 0940   GFRNONAA 49 (L) 12/02/2018 1432   GFRNONAA 58 (L) 02/27/2016 1530   GFRAA 54 (L) 05/25/2019 0940   GFRAA 57 (L) 12/02/2018 1432   GFRAA 66 02/27/2016 1530    No results found for: SPEP, UPEP  Lab Results  Component Value Date   WBC 8.8 06/09/2019   NEUTROABS 6.7 06/09/2019   HGB 9.5 (  L) 06/09/2019   HCT 28.7 (L) 06/09/2019   MCV 99.0 06/09/2019   PLT 236 06/09/2019      Chemistry      Component Value Date/Time   NA 139 05/25/2019 0940   NA 142 12/25/2014 1122   K 4.1 05/25/2019 0940   K 4.1 12/25/2014 1122   CL 104 05/25/2019 0940   CL 102 10/04/2012 1159   CO2 25 05/25/2019 0940   CO2 29 12/25/2014 1122   BUN 22 05/25/2019 0940   BUN 13.7 12/25/2014 1122   CREATININE 1.13 (H) 05/25/2019 0940   CREATININE 1.08 (H) 12/02/2018 1432   CREATININE 0.96 (H) 02/27/2016 1530   CREATININE 0.9 12/25/2014 1122      Component Value Date/Time   CALCIUM 9.4 05/25/2019 0940   CALCIUM 10.1 12/25/2014 1122   ALKPHOS 67 05/25/2019 0940   ALKPHOS 99 12/25/2014 1122   AST 17 05/25/2019 0940   AST 22 12/02/2018 1432   AST 21 12/25/2014 1122   ALT 6 05/25/2019 0940   ALT 13 12/02/2018 1432   ALT 17 12/25/2014 1122   BILITOT 0.3 05/25/2019 0940   BILITOT <0.3 (L) 12/02/2018 1432   BILITOT 0.41 12/25/2014 1122       RADIOGRAPHIC STUDIES: I have personally reviewed the radiological images as listed and agreed with the findings in the report. CT ABDOMEN PELVIS W CONTRAST  Result Date: 05/25/2019 CLINICAL DATA:  Follow-up endometrial carcinoma. Undergoing chemotherapy. EXAM: CT ABDOMEN AND PELVIS WITH CONTRAST TECHNIQUE:  Multidetector CT imaging of the abdomen and pelvis was performed using the standard protocol following bolus administration of intravenous contrast. CONTRAST:  153m OMNIPAQUE IOHEXOL 300 MG/ML  SOLN COMPARISON:  02/07/2019 FINDINGS: Lower Chest: No acute findings. Hepatobiliary: No hepatic masses identified. Several tiny hepatic cysts remains stable. Gallbladder is unremarkable. No evidence of biliary ductal dilatation. Pancreas:  No mass or inflammatory changes. Spleen: Within normal limits in size and appearance. Adrenals/Urinary Tract: No masses identified. Stable small cyst in upper pole of left kidney. No evidence of hydronephrosis. Stomach/Bowel: No evidence of obstruction, inflammatory process or abnormal fluid collections. Vascular/Lymphatic: Sub-cm retroperitoneal and bilateral iliac lymph nodes show no significant change. No pathologically enlarged lymph nodes. No abdominal aortic aneurysm. Aortic atherosclerosis incidentally noted. Reproductive: Uterus is unremarkable. New solid bilateral adnexal masses are seen since prior study. Right adnexal mass measures 8.5 x 5.3 cm, and left adnexal mass measures 6.6 x 3.8 cm. No evidence of ascites. Other: There has been repair of right inguinal hernia since previous study. Multiple small less than 1 cm peritoneal soft tissue nodules are seen in the right anterior pelvis which are new since previous study and suspicious for peritoneal carcinoma. No evidence of ascites. Musculoskeletal:  No suspicious bone lesions identified. IMPRESSION: New solid bilateral adnexal masses, consistent with metastatic disease. New peritoneal soft tissue nodules in the anterior right pelvis, suspicious for peritoneal carcinomatosis. No evidence of ascites. No significant change in shotty sub-cm abdominal retroperitoneal and bilateral iliac lymph nodes. Electronically Signed   By: JMarlaine HindM.D.   On: 05/25/2019 13:37   DG Foot 2 Views Left  Result Date: 06/06/2019 Please see  detailed radiograph report in office note.  DG Foot 2 Views Right  Result Date: 06/06/2019 Please see detailed radiograph report in office note.   All questions were answered. The patient knows to call the clinic with any problems, questions or concerns. No barriers to learning was detected.  The total time spent in the appointment was 25 minutes including  additional treatment planning, review of test results and care coordination  [name], MD 06/09/2019 10:13 AM

## 2019-06-09 NOTE — Progress Notes (Signed)
Erin Esparza said she needs to have a power of attorney completed.  Provided her with the power of attorney form and advised her that social work is not providing the service now due to Covid.  Discussed that she will need 2 witnesses and the form will need to be notarized.  She verbalized understanding and agreement.

## 2019-06-09 NOTE — Assessment & Plan Note (Signed)
She has baseline poorly controlled hypertension She is taking hydrochlorothiazide and enalapril She is instructed to continue the same and to take her blood pressure measurement twice a day moving forward I will call her early next week to check on her blood pressure control

## 2019-06-09 NOTE — Patient Instructions (Addendum)
[facility] Discharge Instructions for Patients Receiving Chemotherapy  Today you received the following chemotherapy agents Keytruda  To help prevent nausea and vomiting after your treatment, we encourage you to take your nausea medication as directed   If you develop nausea and vomiting that is not controlled by your nausea medication, call the clinic.   BELOW ARE SYMPTOMS THAT SHOULD BE REPORTED IMMEDIATELY:  *FEVER GREATER THAN 100.5 F  *CHILLS WITH OR WITHOUT FEVER  NAUSEA AND VOMITING THAT IS NOT CONTROLLED WITH YOUR NAUSEA MEDICATION  *UNUSUAL SHORTNESS OF BREATH  *UNUSUAL BRUISING OR BLEEDING  TENDERNESS IN MOUTH AND THROAT WITH OR WITHOUT PRESENCE OF ULCERS  *URINARY PROBLEMS  *BOWEL PROBLEMS  UNUSUAL RASH Items with * indicate a potential emergency and should be followed up as soon as possible.  Feel free to call the clinic should you have any questions or concerns. The clinic phone number is [phone].  Please show the CHEMO ALERT CARD at check-in to the Emergency Department and triage nurse.  Pembrolizumab injection What is this medicine? PEMBROLIZUMAB (pem broe liz ue mab) is a monoclonal antibody. It is used to treat certain types of cancer. This medicine may be used for other purposes; ask your health care provider or pharmacist if you have questions. COMMON BRAND NAME(S): Keytruda What should I tell my health care provider before I take this medicine? They need to know if you have any of these conditions:  diabetes  immune system problems  inflammatory bowel disease  liver disease  lung or breathing disease  lupus  received or scheduled to receive an organ transplant or a stem-cell transplant that uses donor stem cells  an unusual or allergic reaction to pembrolizumab, other medicines, foods, dyes, or preservatives  pregnant or trying to get pregnant  breast-feeding How should I use this medicine? This medicine is for  infusion into a vein. It is given by a health care professional in a hospital or clinic setting. A special MedGuide will be given to you before each treatment. Be sure to read this information carefully each time. Talk to your pediatrician regarding the use of this medicine in children. While this drug may be prescribed for children as young as 6 months for selected conditions, precautions do apply. Overdosage: If you think you have taken too much of this medicine contact a poison control center or emergency room at once. NOTE: This medicine is only for you. Do not share this medicine with others. What if I miss a dose? It is important not to miss your dose. Call your doctor or health care professional if you are unable to keep an appointment. What may interact with this medicine? Interactions have not been studied. Give your health care provider a list of all the medicines, herbs, non-prescription drugs, or dietary supplements you use. Also tell them if you smoke, drink alcohol, or use illegal drugs. Some items may interact with your medicine. This list may not describe all possible interactions. Give your health care provider a list of all the medicines, herbs, non-prescription drugs, or dietary supplements you use. Also tell them if you smoke, drink alcohol, or use illegal drugs. Some items may interact with your medicine. What should I watch for while using this medicine? Your condition will be monitored carefully while you are receiving this medicine. You may need blood work done while you are taking this medicine. Do not become pregnant while taking this medicine or for 4 months after stopping it. Women should   inform their doctor if they wish to become pregnant or think they might be pregnant. There is a potential for serious side effects to an unborn child. Talk to your health care professional or pharmacist for more information. Do not breast-feed an infant while taking this medicine or for 4  months after the last dose. What side effects may I notice from receiving this medicine? Side effects that you should report to your doctor or health care professional as soon as possible:  allergic reactions like skin rash, itching or hives, swelling of the face, lips, or tongue  bloody or black, tarry  breathing problems  changes in vision  chest pain  chills  confusion  constipation  cough  diarrhea  dizziness or feeling faint or lightheaded  fast or irregular heartbeat  fever  flushing  joint pain  low blood counts - this medicine may decrease the number of white blood cells, red blood cells and platelets. You may be at increased risk for infections and bleeding.  muscle pain  muscle weakness  pain, tingling, numbness in the hands or feet  persistent headache  redness, blistering, peeling or loosening of the skin, including inside the mouth  signs and symptoms of high blood sugar such as dizziness; dry mouth; dry skin; fruity breath; nausea; stomach pain; increased hunger or thirst; increased urination  signs and symptoms of kidney injury like trouble passing urine or change in the amount of urine  signs and symptoms of liver injury like dark urine, light-colored stools, loss of appetite, nausea, right upper belly pain, yellowing of the eyes or skin  sweating  swollen lymph nodes  weight loss Side effects that usually do not require medical attention (report to your doctor or health care professional if they continue or are bothersome):  decreased appetite  hair loss  muscle pain  tiredness This list may not describe all possible side effects. Call your doctor for medical advice about side effects. You may report side effects to FDA at 1-800-FDA-1088. Where should I keep my medicine? This drug is given in a hospital or clinic and will not be stored at home. NOTE: This sheet is a summary. It may not cover all possible information. If you have  questions about this medicine, talk to your doctor, pharmacist, or health care provider.  2020 Elsevier/Gold Standard (2019-03-25 18:07:58)  

## 2019-06-09 NOTE — Assessment & Plan Note (Signed)
From my last visit, we have discussed the risk, benefits, side effects of combination treatment with pembrolizumab and lenvatinib She is in agreement to proceed She will start both today I am concerned about uncontrolled hypertension on lenvatinib even though I dose reduce her upfront I will call her next week to check on her blood pressure control

## 2019-06-10 LAB — T4: T4, Total: 7.8 ug/dL (ref 4.5–12.0)

## 2019-06-10 LAB — CA 125: Cancer Antigen (CA) 125: 468 U/mL — ABNORMAL HIGH (ref 0.0–38.1)

## 2019-06-14 ENCOUNTER — Telehealth: Payer: Self-pay

## 2019-06-14 NOTE — Telephone Encounter (Signed)
-----   Message from [name], MD sent at 06/14/2019  8:03 AM EST ----- Regarding: can you call patient about her BP?

## 2019-06-14 NOTE — Telephone Encounter (Signed)
Called back and gave below message to daughter. She verbalized understanding.

## 2019-06-14 NOTE — Telephone Encounter (Signed)
BP looks ok Advise her to make sure she drinks enough oral fluids daily

## 2019-06-14 NOTE — Telephone Encounter (Signed)
Called and spoke with daughter. Gave above message.  Today bp 126/60 1/11 am bp 124/61 and pm 134/66 1/10 am bp 130/64 and pm 119/55 1/9 am 130/66 and pm 125/64, per daughter she a light headed feeling that am that lasted less than 1 minute. 1/8 am 112/59 and pm 128/63 1/7 evening 130/64 and pm 115/55.

## 2019-06-30 ENCOUNTER — Inpatient Hospital Stay: Payer: Medicare Other

## 2019-06-30 ENCOUNTER — Inpatient Hospital Stay (HOSPITAL_BASED_OUTPATIENT_CLINIC_OR_DEPARTMENT_OTHER): Payer: Medicare Other | Admitting: Hematology and Oncology

## 2019-06-30 ENCOUNTER — Other Ambulatory Visit: Payer: Self-pay

## 2019-06-30 DIAGNOSIS — C55 Malignant neoplasm of uterus, part unspecified: Secondary | ICD-10-CM

## 2019-06-30 DIAGNOSIS — Z79811 Long term (current) use of aromatase inhibitors: Secondary | ICD-10-CM | POA: Diagnosis not present

## 2019-06-30 DIAGNOSIS — N183 Chronic kidney disease, stage 3 unspecified: Secondary | ICD-10-CM | POA: Diagnosis not present

## 2019-06-30 DIAGNOSIS — I10 Essential (primary) hypertension: Secondary | ICD-10-CM

## 2019-06-30 DIAGNOSIS — C50212 Malignant neoplasm of upper-inner quadrant of left female breast: Secondary | ICD-10-CM | POA: Diagnosis not present

## 2019-06-30 DIAGNOSIS — Z170 Estrogen receptor positive status [ER+]: Secondary | ICD-10-CM | POA: Diagnosis not present

## 2019-06-30 DIAGNOSIS — D6481 Anemia due to antineoplastic chemotherapy: Secondary | ICD-10-CM | POA: Diagnosis not present

## 2019-06-30 DIAGNOSIS — Z7901 Long term (current) use of anticoagulants: Secondary | ICD-10-CM | POA: Diagnosis not present

## 2019-06-30 DIAGNOSIS — Z79899 Other long term (current) drug therapy: Secondary | ICD-10-CM | POA: Diagnosis not present

## 2019-06-30 DIAGNOSIS — D631 Anemia in chronic kidney disease: Secondary | ICD-10-CM | POA: Diagnosis not present

## 2019-06-30 DIAGNOSIS — I129 Hypertensive chronic kidney disease with stage 1 through stage 4 chronic kidney disease, or unspecified chronic kidney disease: Secondary | ICD-10-CM | POA: Diagnosis not present

## 2019-06-30 DIAGNOSIS — Z5112 Encounter for antineoplastic immunotherapy: Secondary | ICD-10-CM | POA: Diagnosis not present

## 2019-06-30 DIAGNOSIS — Z86718 Personal history of other venous thrombosis and embolism: Secondary | ICD-10-CM | POA: Diagnosis not present

## 2019-06-30 LAB — CBC WITH DIFFERENTIAL (CANCER CENTER ONLY)
Abs Immature Granulocytes: 0.02 10*3/uL (ref 0.00–0.07)
Basophils Absolute: 0 10*3/uL (ref 0.0–0.1)
Basophils Relative: 0 %
Eosinophils Absolute: 0.4 10*3/uL (ref 0.0–0.5)
Eosinophils Relative: 5 %
HCT: 32.2 % — ABNORMAL LOW (ref 36.0–46.0)
Hemoglobin: 10.7 g/dL — ABNORMAL LOW (ref 12.0–15.0)
Immature Granulocytes: 0 %
Lymphocytes Relative: 14 %
Lymphs Abs: 1 10*3/uL (ref 0.7–4.0)
MCH: 32.7 pg (ref 26.0–34.0)
MCHC: 33.2 g/dL (ref 30.0–36.0)
MCV: 98.5 fL (ref 80.0–100.0)
Monocytes Absolute: 0.8 10*3/uL (ref 0.1–1.0)
Monocytes Relative: 12 %
Neutro Abs: 4.7 10*3/uL (ref 1.7–7.7)
Neutrophils Relative %: 69 %
Platelet Count: 200 10*3/uL (ref 150–400)
RBC: 3.27 MIL/uL — ABNORMAL LOW (ref 3.87–5.11)
RDW: 13.5 % (ref 11.5–15.5)
WBC Count: 7 10*3/uL (ref 4.0–10.5)
nRBC: 0 % (ref 0.0–0.2)

## 2019-06-30 LAB — CMP (CANCER CENTER ONLY)
ALT: 10 U/L (ref 0–44)
AST: 22 U/L (ref 15–41)
Albumin: 3.4 g/dL — ABNORMAL LOW (ref 3.5–5.0)
Alkaline Phosphatase: 82 U/L (ref 38–126)
Anion gap: 8 (ref 5–15)
BUN: 23 mg/dL (ref 8–23)
CO2: 27 mmol/L (ref 22–32)
Calcium: 9 mg/dL (ref 8.9–10.3)
Chloride: 105 mmol/L (ref 98–111)
Creatinine: 1.03 mg/dL — ABNORMAL HIGH (ref 0.44–1.00)
GFR, Est AFR Am: 59 mL/min — ABNORMAL LOW (ref >60)
GFR, Estimated: 51 mL/min — ABNORMAL LOW (ref >60)
Glucose, Bld: 99 mg/dL (ref 70–99)
Potassium: 3.9 mmol/L (ref 3.5–5.1)
Sodium: 140 mmol/L (ref 135–145)
Total Bilirubin: 0.4 mg/dL (ref 0.3–1.2)
Total Protein: 7.9 g/dL (ref 6.5–8.1)

## 2019-06-30 LAB — TSH: TSH: <0.08 u[IU]/mL — ABNORMAL LOW (ref 0.308–3.960)

## 2019-06-30 LAB — TOTAL PROTEIN, URINE DIPSTICK: Protein, ur: NEGATIVE mg/dL

## 2019-06-30 MED ORDER — SODIUM CHLORIDE 0.9 % IV SOLN
200.0000 mg | Freq: Once | INTRAVENOUS | Status: AC
  Administered 2019-06-30: 200 mg via INTRAVENOUS
  Filled 2019-06-30: qty 8

## 2019-06-30 MED ORDER — SODIUM CHLORIDE 0.9% FLUSH
10.0000 mL | Freq: Once | INTRAVENOUS | Status: AC
  Administered 2019-06-30: 10 mL
  Filled 2019-06-30: qty 10

## 2019-06-30 MED ORDER — SODIUM CHLORIDE 0.9 % IV SOLN
Freq: Once | INTRAVENOUS | Status: AC
  Filled 2019-06-30: qty 250

## 2019-06-30 MED ORDER — SODIUM CHLORIDE 0.9% FLUSH
10.0000 mL | INTRAVENOUS | Status: DC | PRN
  Administered 2019-06-30: 10 mL
  Filled 2019-06-30: qty 10

## 2019-06-30 MED ORDER — HEPARIN SOD (PORK) LOCK FLUSH 100 UNIT/ML IV SOLN
500.0000 [IU] | Freq: Once | INTRAVENOUS | Status: AC | PRN
  Administered 2019-06-30: 500 [IU]
  Filled 2019-06-30: qty 5

## 2019-06-30 NOTE — Patient Instructions (Signed)
[facility] Discharge Instructions for Patients Receiving Chemotherapy  Today you received the following chemotherapy agents:  Keytruda.  To help prevent nausea and vomiting after your treatment, we encourage you to take your nausea medication as directed.   If you develop nausea and vomiting that is not controlled by your nausea medication, call the clinic.   BELOW ARE SYMPTOMS THAT SHOULD BE REPORTED IMMEDIATELY:  *FEVER GREATER THAN 100.5 F  *CHILLS WITH OR WITHOUT FEVER  NAUSEA AND VOMITING THAT IS NOT CONTROLLED WITH YOUR NAUSEA MEDICATION  *UNUSUAL SHORTNESS OF BREATH  *UNUSUAL BRUISING OR BLEEDING  TENDERNESS IN MOUTH AND THROAT WITH OR WITHOUT PRESENCE OF ULCERS  *URINARY PROBLEMS  *BOWEL PROBLEMS  UNUSUAL RASH Items with * indicate a potential emergency and should be followed up as soon as possible.  Feel free to call the clinic should you have any questions or concerns. The clinic phone number is [phone].  Please show the CHEMO ALERT CARD at check-in to the Emergency Department and triage nurse.    

## 2019-07-01 ENCOUNTER — Encounter: Payer: Self-pay | Admitting: Hematology and Oncology

## 2019-07-01 ENCOUNTER — Telehealth: Payer: Self-pay | Admitting: Hematology and Oncology

## 2019-07-01 LAB — T4: T4, Total: 18.5 ug/dL — ABNORMAL HIGH (ref 4.5–12.0)

## 2019-07-01 NOTE — Assessment & Plan Note (Signed)
She tolerated treatment well so far Her blood pressure control at home is satisfactory We will proceed with minimum 4 cycles of treatment before we proceed with repeat imaging study I will call her next week to check on her blood pressure control at home

## 2019-07-01 NOTE — Telephone Encounter (Signed)
I talk with patient regarding schedule  

## 2019-07-01 NOTE — Assessment & Plan Note (Signed)
She has stable CKD stage III Observe closely for now

## 2019-07-01 NOTE — Assessment & Plan Note (Signed)
Her blood pressure control at home is satisfactory She will continue similar blood pressure medication I will call her next week to check on her again

## 2019-07-01 NOTE — Progress Notes (Signed)
[place] OFFICE PROGRESS NOTE  Patient Care Team: [name], DO as PCP - General (Family Medicine) [name], MD as Consulting Physician (Ophthalmology) [name], DPM as Consulting Physician (Podiatry) [name], [name], MD as Referring Physician (Optometry) [name], MD (Inactive) as Consulting Physician (Orthopedic Surgery) [name], RN as Oncology Nurse Navigator (Oncology)  ASSESSMENT & PLAN:  Uterine cancer [facility]) She tolerated treatment well so far Her blood pressure control at home is satisfactory We will proceed with minimum 4 cycles of treatment before we proceed with repeat imaging study I will call her next week to check on her blood pressure control at home   Essential hypertension Her blood pressure control at home is satisfactory She will continue similar blood pressure medication I will call her next week to check on her again  CKD (chronic kidney disease), stage III ([place]) She has stable CKD stage III Observe closely for now    No orders of the defined types were placed in this encounter.   All questions were answered. The patient knows to call the clinic with any problems, questions or concerns. The total time spent in the appointment was 20 minutes encounter with patients including review of chart and various tests results, discussions about plan of care and coordination of care plan   [name], MD 07/01/2019 8:17 AM  INTERVAL HISTORY: Please see below for problem oriented charting. 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 Ultrasound irregular hypoechoic mass 7 x 6 x 5 mm   01/06/2012 Initial Biopsy   Initial biopsy was benign . 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 Cervix, biopsy - HIGH GRADE CARCINOMA. - SEE COMMENT. 2. Endocervix, curettage - HIGH GRADE CARCINOMA. - SEE COMMENT. 3. Endometrium, biopsy - HIGH GRADE CARCINOMA. - SEE COMMENT. Microscopic Comment 1. - 3. The carcinoma in the three specimens is morphologically similar and has features suggestive of papillary serous carcinoma.   07/26/2018 Tumor Marker   Patient's tumor was tested for the following markers: CA-125 Results of the tumor marker test revealed 835   07/28/2018 Imaging   1. Several (at least 10) solid pulmonary nodules scattered throughout both lungs are new since 2012 chest CT, likely representing pulmonary metastases, largest 6 mm, below PET resolution. 2. Redemonstration of  hypodense 2.5 cm segment 7 right liver lobe mass, indeterminate, suspicious for liver metastasis. 3. 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 4. Three-vessel coronary atherosclerosis.  Aortic Atherosclerosis (ICD10-I70.0).   08/03/2018 Cancer  Staging   Staging form: Corpus Uteri - Carcinoma and Carcinosarcoma, AJCC 8th Edition - Clinical: FIGO Stage IVB (cT3b, cN2, cM1) - Signed by [name], MD on 08/03/2018   08/05/2018 Tumor Marker   Patient's tumor was tested for the following markers: CA-125 Results of the tumor marker test revealed 901   08/06/2018 Procedure   Placement of single lumen port a cath via right internal jugular vein. The catheter tip lies at the cavo-atrial junction. A power injectable port a cath was placed and is ready for immediate use.    08/11/2018 - 01/12/2019 Chemotherapy   The patient had carboplatin and taxol x 8 cycles   09/01/2018 Tumor Marker   Patient's tumor was tested for the following markers: CA-125 Results of the tumor marker test revealed 406   09/09/2018 Genetic Testing   The Common Hereditary Cancers Panel offered by [facility] includes sequencing and/or deletion duplication testing of the following 48 genes: APC, ATM, AXIN2, BARD1, BMPR1A, BRCA1, BRCA2, BRIP1, CDH1, CDKN2A (p14ARF), CDKN2A (p16INK4a), CKD4, CHEK2, CTNNA1, DICER1, EPCAM (Deletion/duplication testing only), GREM1 (promoter region deletion/duplication testing only), KIT, MEN1, MLH1, MSH2, MSH3, MSH6, MUTYH, NBN, NF1, NHTL1, PALB2, PDGFRA, PMS2, POLD1, POLE, PTEN, RAD50, RAD51C, RAD51D, RNF43, SDHB, SDHC, SDHD, SMAD4, SMARCA4. STK11, TP53, TSC1, TSC2, and VHL.  The following genes were evaluated for sequence changes only: SDHA and HOXB13 c.251G>A variant only.  Results: Negative, no pathogenic variants identified. The report date is 09/09/2018.     09/22/2018 Tumor Marker   Patient's tumor was tested for the following markers: CA-125 Results of the tumor marker test revealed 110   10/12/2018 Tumor Marker   Patient's tumor was tested for the following markers: CA-125 Results of the tumor marker test revealed 55.3   10/12/2018 Imaging   1. Mild decrease in pulmonary metastases, liver metastases, and metastatic abdominal and pelvic  lymphadenopathy. 2. Decreased size of complex cystic lesion in left presacral region. 3. No new or progressive metastatic disease identified. 4. Stable small right inguinal hernia containing small portion of urinary bladder.   11/03/2018 Tumor Marker   Patient's tumor was tested for the following markers: CA-125 Results of the tumor marker test revealed 34.3    Genetic Testing   Patient has genetic testing done for HER2. Results revealed patient has the following: HER2 - negative from pathology from 07/26/18.   12/20/2018 Imaging   1. Possible new omental nodule. 2. Improving abdominal and pelvic retroperitoneal adenopathy. 3. Stable pulmonary and hepatic metastatic disease. 4. Left perirectal/presacral fluid collection has decreased in size. 5. Right inguinal hernia contains a small portion of the bladder, as before. 6. Aortic atherosclerosis (ICD10-170.0). Coronary artery calcification   12/21/2018 Tumor Marker   Patient's tumor was tested for the following markers: CA-125 Results of the tumor marker test revealed 15.6   01/15/2019 Imaging   Ct abdomen and pelvis 1. Small hiatal hernia with fluid in the distal esophagus, which may represent reflux or esophagitis. No other acute findings. 2. Right inguinal hernia contains anterolateral aspect of the urinary bladder, unchanged in appearance from prior. Mild edema within the hernia sac is similar. 3. Unchanged small low-density lesions in the liver. 4. Unchanged retroperitoneal and bilateral iliac lymph nodes. Small ventral abdominal omental nodule tentatively identified and unchanged from prior.   Aortic Atherosclerosis (ICD10-I70.0).  02/02/2019 Tumor Marker   Patient's tumor was tested for the following markers: CA-125 Results of the tumor marker test revealed 7.8   03/22/2019 Surgery   Preoperative diagnosis: History of incarcerated right inguinal hernia   Postop diagnosis: Incarcerated right inguinal hernia with preperitoneal  fat without strangulation or obstruction   Procedure: Repair of right inguinal hernia with ultra Pro hernia system mesh   Surgeon: Erroll Luna, MD   03/31/2019 Tumor Marker   Patient's tumor was tested for the following markers: CA-`125 Results of the tumor marker test revealed 24.4.   05/25/2019 Tumor Marker   Patient's tumor was tested for the following markers: CA-125 Results of the tumor marker test revealed 287   05/25/2019 Imaging   New solid bilateral adnexal masses, consistent with metastatic disease.   New peritoneal soft tissue nodules in the anterior right pelvis, suspicious for peritoneal carcinomatosis. No evidence of ascites.   No significant change in shotty sub-cm abdominal retroperitoneal and bilateral iliac lymph nodes.   06/09/2019 -  Chemotherapy   The patient had pembrolizumab (KEYTRUDA) 200 mg in sodium chloride 0.9 % 50 mL chemo infusion, 200 mg, Intravenous, Once, 2 of 6 cycles Administration: 200 mg (06/09/2019), 200 mg (06/30/2019)  for chemotherapy treatment.    06/09/2019 Tumor Marker   Patient's tumor was tested for the following markers: CA-125 Results of the tumor marker test revealed 468     REVIEW OF SYSTEMS:   Constitutional: Denies fevers, chills or abnormal weight loss Eyes: Denies blurriness of vision Ears, nose, mouth, throat, and face: Denies mucositis or sore throat Respiratory: Denies cough, dyspnea or wheezes Cardiovascular: Denies palpitation, chest discomfort or lower extremity swelling Gastrointestinal:  Denies nausea, heartburn or change in bowel habits Skin: Denies abnormal skin rashes Lymphatics: Denies new lymphadenopathy or easy bruising Neurological:Denies numbness, tingling or new weaknesses Behavioral/Psych: Mood is stable, no new changes  All other systems were reviewed with the patient and are negative.  I have reviewed the past medical history, past surgical history, social history and family history with the patient and  they are unchanged from previous note.  ALLERGIES:  has No Known Allergies.  MEDICATIONS:  Current Outpatient Medications  Medication Sig Dispense Refill  . acetaminophen (TYLENOL) 500 MG tablet Take 1 tablet (500 mg total) by mouth every 6 (six) hours as needed. (Patient taking differently: Take 1,000 mg by mouth every 6 (six) hours as needed for moderate pain. ) 30 tablet 3  . Calcium Carbonate (CALTRATE 600 PO) Take 1 tablet by mouth 2 (two) times daily.     . enalapril (VASOTEC) 20 MG tablet TAKE 1 TABLET(20 MG) BY MOUTH TWICE DAILY (Patient taking differently: Take 20 mg by mouth daily. ) 180 tablet 2  . Fluticasone-Salmeterol (ADVAIR DISKUS) 100-50 MCG/DOSE AEPB USE 1 INHALATION BY MOUTH TWICE DAILY 60 each 2  . furosemide (LASIX) 40 MG tablet TAKE 1 TABLET BY MOUTH DAILY AS NEEDED FOR LEG SWELLING (Patient taking differently: Take 40 mg by mouth daily as needed (leg swelling). ) 90 tablet 2  . hydrochlorothiazide (HYDRODIURIL) 25 MG tablet TAKE 1 TABLET(25 MG) BY MOUTH DAILY (Patient taking differently: Take 25 mg by mouth daily. ) 90 tablet 1  . lenvatinib 10 mg daily dose (LENVIMA) capsule Take 1 capsule (10 mg total) by mouth daily. 30 capsule 11  . lidocaine-prilocaine (EMLA) cream Apply to affected area once (Patient taking differently: Apply 1 application topically daily as needed. Apply to affected area once) 30 g 3  . Multiple Vitamin (  MULTIVITAMIN) tablet Take 1 tablet by mouth daily.    . Multiple Vitamins-Minerals (ICAPS AREDS 2 PO) Take 1 capsule by mouth 2 (two) times daily.    . ondansetron (ZOFRAN) 8 MG tablet Take 1 tablet (8 mg total) by mouth every 8 (eight) hours as needed. Start on the third day after chemotherapy. 30 tablet 1  . oxyCODONE (OXY IR/ROXICODONE) 5 MG immediate release tablet Take 1 tablet (5 mg total) by mouth every 6 (six) hours as needed for severe pain. 15 tablet 0  . polyethylene glycol (MIRALAX / GLYCOLAX) 17 g packet Take 17 g by mouth daily as needed  for moderate constipation.    . prochlorperazine (COMPAZINE) 10 MG tablet Take 1 tablet (10 mg total) by mouth every 6 (six) hours as needed (Nausea or vomiting). (Patient not taking: Reported on 02/07/2019) 30 tablet 1  . rivaroxaban (XARELTO) 20 MG TABS tablet Take 1 tablet (20 mg total) by mouth daily with supper. 90 tablet 1  . Vitamin D, Cholecalciferol, 1000 units CAPS Take 1,000 Units by mouth daily.      No current facility-administered medications for this visit.    PHYSICAL EXAMINATION: ECOG PERFORMANCE STATUS: 1 - Symptomatic but completely ambulatory  Vitals:   06/30/19 0957  BP: 126/65  Pulse: 91  Resp: 18  Temp: (!) 97.4 F (36.3 C)  SpO2: 100%   Filed Weights   06/30/19 0957  Weight: 245 lb 6.4 oz (111.3 kg)    GENERAL:alert, no distress and comfortable SKIN: skin color, texture, turgor are normal, no rashes or significant lesions EYES: normal, Conjunctiva are pink and non-injected, sclera clear OROPHARYNX:no exudate, no erythema and lips, buccal mucosa, and tongue normal  NECK: supple, thyroid normal size, non-tender, without nodularity LYMPH:  no palpable lymphadenopathy in the cervical, axillary or inguinal LUNGS: clear to auscultation and percussion with normal breathing effort HEART: regular rate & rhythm and no murmurs with mild bilateral lower extremity edema ABDOMEN:abdomen soft, non-tender and normal bowel sounds Musculoskeletal:no cyanosis of digits and no clubbing  NEURO: alert & oriented x 3 with fluent speech, no focal motor/sensory deficits  LABORATORY DATA:  I have reviewed the data as listed    Component Value Date/Time   NA 140 06/30/2019 0941   NA 142 12/25/2014 1122   K 3.9 06/30/2019 0941   K 4.1 12/25/2014 1122   CL 105 06/30/2019 0941   CL 102 10/04/2012 1159   CO2 27 06/30/2019 0941   CO2 29 12/25/2014 1122   GLUCOSE 99 06/30/2019 0941   GLUCOSE 99 12/25/2014 1122   GLUCOSE 116 (H) 10/04/2012 1159   BUN 23 06/30/2019 0941   BUN  13.7 12/25/2014 1122   CREATININE 1.03 (H) 06/30/2019 0941   CREATININE 0.96 (H) 02/27/2016 1530   CREATININE 0.9 12/25/2014 1122   CALCIUM 9.0 06/30/2019 0941   CALCIUM 10.1 12/25/2014 1122   PROT 7.9 06/30/2019 0941   PROT 7.8 12/25/2014 1122   ALBUMIN 3.4 (L) 06/30/2019 0941   ALBUMIN 3.4 (L) 12/25/2014 1122   AST 22 06/30/2019 0941   AST 21 12/25/2014 1122   ALT 10 06/30/2019 0941   ALT 17 12/25/2014 1122   ALKPHOS 82 06/30/2019 0941   ALKPHOS 99 12/25/2014 1122   BILITOT 0.4 06/30/2019 0941   BILITOT 0.41 12/25/2014 1122   GFRNONAA 51 (L) 06/30/2019 0941   GFRNONAA 58 (L) 02/27/2016 1530   GFRAA 59 (L) 06/30/2019 0941   GFRAA 66 02/27/2016 1530    No results found for: SPEP,  UPEP  Lab Results  Component Value Date   WBC 7.0 06/30/2019   NEUTROABS 4.7 06/30/2019   HGB 10.7 (L) 06/30/2019   HCT 32.2 (L) 06/30/2019   MCV 98.5 06/30/2019   PLT 200 06/30/2019      Chemistry      Component Value Date/Time   NA 140 06/30/2019 0941   NA 142 12/25/2014 1122   K 3.9 06/30/2019 0941   K 4.1 12/25/2014 1122   CL 105 06/30/2019 0941   CL 102 10/04/2012 1159   CO2 27 06/30/2019 0941   CO2 29 12/25/2014 1122   BUN 23 06/30/2019 0941   BUN 13.7 12/25/2014 1122   CREATININE 1.03 (H) 06/30/2019 0941   CREATININE 0.96 (H) 02/27/2016 1530   CREATININE 0.9 12/25/2014 1122      Component Value Date/Time   CALCIUM 9.0 06/30/2019 0941   CALCIUM 10.1 12/25/2014 1122   ALKPHOS 82 06/30/2019 0941   ALKPHOS 99 12/25/2014 1122   AST 22 06/30/2019 0941   AST 21 12/25/2014 1122   ALT 10 06/30/2019 0941   ALT 17 12/25/2014 1122   BILITOT 0.4 06/30/2019 0941   BILITOT 0.41 12/25/2014 1122

## 2019-07-04 MED FILL — LENVIMA 10 MG DAILY DOSE: 10 | 30 days supply | Qty: 30 | Fill #1

## 2019-07-08 ENCOUNTER — Telehealth: Payer: Self-pay | Admitting: *Deleted

## 2019-07-08 NOTE — Telephone Encounter (Signed)
Telephone call to daughter. She reports it has been doing well.   133/78 in the morning and evening 138/65 on Feb 1st 155/70 in the morning and evening 125/68 on Feb 2nd 122/62 on Feb 3rd 132/70 on Feb 4th 131/63 on Feb 5th

## 2019-07-08 NOTE — Telephone Encounter (Signed)
Good, thanks, no change in medication

## 2019-07-08 NOTE — Telephone Encounter (Signed)
-----   Message from [name], MD sent at 07/08/2019  7:34 AM EST ----- Regarding: can you call and ask how is her BP doing?

## 2019-07-18 ENCOUNTER — Inpatient Hospital Stay: Payer: Medicare Other | Attending: Gynecologic Oncology

## 2019-07-18 ENCOUNTER — Inpatient Hospital Stay (HOSPITAL_BASED_OUTPATIENT_CLINIC_OR_DEPARTMENT_OTHER): Payer: Medicare Other | Admitting: Medical

## 2019-07-18 ENCOUNTER — Inpatient Hospital Stay: Payer: Medicare Other

## 2019-07-18 ENCOUNTER — Other Ambulatory Visit: Payer: Self-pay

## 2019-07-18 ENCOUNTER — Telehealth: Payer: Self-pay | Admitting: Oncology

## 2019-07-18 ENCOUNTER — Telehealth: Payer: Self-pay

## 2019-07-18 ENCOUNTER — Other Ambulatory Visit: Payer: Self-pay | Admitting: Medical

## 2019-07-18 DIAGNOSIS — D631 Anemia in chronic kidney disease: Secondary | ICD-10-CM | POA: Diagnosis not present

## 2019-07-18 DIAGNOSIS — T451X5A Adverse effect of antineoplastic and immunosuppressive drugs, initial encounter: Secondary | ICD-10-CM | POA: Diagnosis not present

## 2019-07-18 DIAGNOSIS — E860 Dehydration: Secondary | ICD-10-CM | POA: Diagnosis not present

## 2019-07-18 DIAGNOSIS — C7800 Secondary malignant neoplasm of unspecified lung: Secondary | ICD-10-CM

## 2019-07-18 DIAGNOSIS — C55 Malignant neoplasm of uterus, part unspecified: Secondary | ICD-10-CM

## 2019-07-18 DIAGNOSIS — Z5112 Encounter for antineoplastic immunotherapy: Secondary | ICD-10-CM | POA: Insufficient documentation

## 2019-07-18 DIAGNOSIS — N183 Chronic kidney disease, stage 3 unspecified: Secondary | ICD-10-CM | POA: Insufficient documentation

## 2019-07-18 DIAGNOSIS — Z79899 Other long term (current) drug therapy: Secondary | ICD-10-CM | POA: Insufficient documentation

## 2019-07-18 DIAGNOSIS — R531 Weakness: Secondary | ICD-10-CM

## 2019-07-18 DIAGNOSIS — G8929 Other chronic pain: Secondary | ICD-10-CM | POA: Insufficient documentation

## 2019-07-18 DIAGNOSIS — I129 Hypertensive chronic kidney disease with stage 1 through stage 4 chronic kidney disease, or unspecified chronic kidney disease: Secondary | ICD-10-CM | POA: Diagnosis not present

## 2019-07-18 DIAGNOSIS — D6481 Anemia due to antineoplastic chemotherapy: Secondary | ICD-10-CM

## 2019-07-18 DIAGNOSIS — C541 Malignant neoplasm of endometrium: Secondary | ICD-10-CM | POA: Diagnosis present

## 2019-07-18 LAB — CMP (CANCER CENTER ONLY)
ALT: 21 U/L (ref 0–44)
AST: 29 U/L (ref 15–41)
Albumin: 3.2 g/dL — ABNORMAL LOW (ref 3.5–5.0)
Alkaline Phosphatase: 84 U/L (ref 38–126)
Anion gap: 12 (ref 5–15)
BUN: 37 mg/dL — ABNORMAL HIGH (ref 8–23)
CO2: 26 mmol/L (ref 22–32)
Calcium: 10.1 mg/dL (ref 8.9–10.3)
Chloride: 102 mmol/L (ref 98–111)
Creatinine: 1.03 mg/dL — ABNORMAL HIGH (ref 0.44–1.00)
GFR, Est AFR Am: 59 mL/min — ABNORMAL LOW (ref >60)
GFR, Estimated: 51 mL/min — ABNORMAL LOW (ref >60)
Glucose, Bld: 114 mg/dL — ABNORMAL HIGH (ref 70–99)
Potassium: 4.2 mmol/L (ref 3.5–5.1)
Sodium: 140 mmol/L (ref 135–145)
Total Bilirubin: 0.3 mg/dL (ref 0.3–1.2)
Total Protein: 8.2 g/dL — ABNORMAL HIGH (ref 6.5–8.1)

## 2019-07-18 LAB — CBC WITH DIFFERENTIAL (CANCER CENTER ONLY)
Abs Immature Granulocytes: 0.02 10*3/uL (ref 0.00–0.07)
Basophils Absolute: 0 10*3/uL (ref 0.0–0.1)
Basophils Relative: 0 %
Eosinophils Absolute: 0.1 10*3/uL (ref 0.0–0.5)
Eosinophils Relative: 2 %
HCT: 32.2 % — ABNORMAL LOW (ref 36.0–46.0)
Hemoglobin: 10.7 g/dL — ABNORMAL LOW (ref 12.0–15.0)
Immature Granulocytes: 0 %
Lymphocytes Relative: 11 %
Lymphs Abs: 0.8 10*3/uL (ref 0.7–4.0)
MCH: 32.4 pg (ref 26.0–34.0)
MCHC: 33.2 g/dL (ref 30.0–36.0)
MCV: 97.6 fL (ref 80.0–100.0)
Monocytes Absolute: 1 10*3/uL (ref 0.1–1.0)
Monocytes Relative: 14 %
Neutro Abs: 5.2 10*3/uL (ref 1.7–7.7)
Neutrophils Relative %: 73 %
Platelet Count: 211 10*3/uL (ref 150–400)
RBC: 3.3 MIL/uL — ABNORMAL LOW (ref 3.87–5.11)
RDW: 12.5 % (ref 11.5–15.5)
WBC Count: 7.2 10*3/uL (ref 4.0–10.5)
nRBC: 0 % (ref 0.0–0.2)

## 2019-07-18 LAB — MAGNESIUM: Magnesium: 1.8 mg/dL (ref 1.7–2.4)

## 2019-07-18 MED ORDER — HEPARIN SOD (PORK) LOCK FLUSH 100 UNIT/ML IV SOLN
500.0000 [IU] | Freq: Once | INTRAVENOUS | Status: AC
  Administered 2019-07-18: 500 [IU]
  Filled 2019-07-18: qty 5

## 2019-07-18 MED ORDER — SODIUM CHLORIDE 0.9 % IV SOLN
INTRAVENOUS | Status: DC
  Filled 2019-07-18: qty 250

## 2019-07-18 MED ORDER — SODIUM CHLORIDE 0.9 % IV SOLN
Freq: Once | INTRAVENOUS | Status: AC
  Filled 2019-07-18: qty 250

## 2019-07-18 MED ORDER — SODIUM CHLORIDE 0.9% FLUSH
10.0000 mL | Freq: Once | INTRAVENOUS | Status: AC
  Administered 2019-07-18: 10 mL
  Filled 2019-07-18: qty 10

## 2019-07-18 NOTE — Progress Notes (Signed)
Pt reports two episodes of loose stools w/abd cramping today, states afterwards she felt flushed and lightheaded.  Reports feeling improved when sitting/resting.  Daughter present for visit, reports pt does not drink or eat very often d/t lack of appetite.  Denies CP/SOB or LOC/falls.  A&Ox4.  Pt transferred to infusion suite w/belongings via w/c for 1L IVF NS by PA Lucianne Lei who provided report to receiving RN.

## 2019-07-18 NOTE — Telephone Encounter (Signed)
Erin Esparza (daughter) called and asked about financial assistance programs. Advised her that I will contact our Education officer, museum and Chiropractor to see if there are any programs [name] could apply for.

## 2019-07-18 NOTE — Telephone Encounter (Signed)
Daughter called and left a message. That her Mom is having weakness and needs appt today.  Called back and spoke with daughter. She is concerned and is requesting appt today for her Mom today.  She started having weakness all over today. One episode of diarrhea. She has not been eating much but she is drinking fluids. Talked with Sandi Mealy, PA. Added lab and appt with Sandi Mealy, PA for today. Daughter is aware of times of appt and heading to [facility] now.

## 2019-07-18 NOTE — Patient Instructions (Signed)

## 2019-07-19 ENCOUNTER — Encounter: Payer: Self-pay | Admitting: Hematology and Oncology

## 2019-07-19 NOTE — Progress Notes (Signed)
[facility] Progress Note   Erin Esparza [id number] 03-12-1941 79 y.o.  Erin Esparza is managed by Dr. [name]  Actively treated with chemotherapy/immunotherapy/hormonal therapy: yes  Current therapy: Keytruda  Last treated: 06/30/2019 (cycle 2, day 1)  Next scheduled appointment with provider: 07/31/2019  Assessment: Plan:    Anemia due to antineoplastic chemotherapy  Malignant neoplasm of uterus, unspecified site ([employer]) - Plan: heparin lock flush 100 unit/mL, sodium chloride flush (NS) 0.9 % injection 10 mL, DISCONTINUED: heparin lock flush 100 unit/mL, DISCONTINUED: sodium chloride flush (NS) 0.9 % injection 10 mL  Dehydration - Plan: 0.9 %  sodium chloride infusion   Anemia: A CBC returned stable with a hemoglobin of 10.7 and hematocrit of 32.2.  Malignant neoplasm of the uterus: The patient continues to be followed by Dr. [name] and is status post cycle 2, day 1 of Keytruda which was dosed on 06/30/2019.  The patient is scheduled to follow-up with Dr. [name] on 07/31/2019.  Dehydration and suspected vasovagal response: The patient's labs returned showing a creatinine of 1.03 which is stable but with a BUN that is higher at 37.  She was given 1 L of IV normal saline today.  And she was feeling better after she received her IV and was released home to follow-up as scheduled.  Please see After Visit Summary for patient specific instructions.  Future Appointments  Date Time Provider [place]  07/21/2019 10:15 AM [facility]FLUSH [facility] None  07/21/2019 10:30 AM [facility] [place] FLUSH [facility] None  07/21/2019 11:00 AM [name], MD [facility] None  07/21/2019 12:15 PM [facility] INFUSION [facility] None  08/11/2019  9:15 AM [facility] LAB 2 [facility] None  08/11/2019  9:30 AM [facility] [place] FLUSH [facility] None  08/11/2019 10:00 AM Alvy Bimler, Ni, MD [facility] None  08/11/2019 11:00 AM [facility] INFUSION [facility] None    No  orders of the defined types were placed in this encounter.      Subjective:   Patient ID:  Erin Esparza is a 79 y.o. (DOB 29-Nov-1940) female.  Chief Complaint:  Chief Complaint  Patient presents with  . Fatigue    HPI Erin Esparza  Is a 79 y.o. female with a diagnosis of malignant neoplasm of the uterus.  She is managed with Dr. [name] and is status post cycle 2, day 1 of Keytruda which was dosed on 06/30/2019.  She presents to the clinic today with her daughter.  She had a.  Earlier today of feeling dizzy after she had gotten up to go to the bathroom to have a bowel movement.  She reports having diarrhea approximately 3-4 times per day with some mild abdominal pain.  She had to sit down after she had gone to the bathroom.  She states that she is drinking sufficient amounts of fluids but reports that she is not drinking water and only had half a can of Coke today.  She typically drinks 412 ounce Coca-Cola was daily.  She does not like water.  She states that she has not been eating as well.  She denies fevers, chills, myalgias, arthralgias, nausea, vomiting, chest pain, or shortness of breath.  Medications: I have reviewed the patient's current medications.  Allergies: No Known Allergies  Past Medical History:  Diagnosis Date  . Abdominal pain 10/17/2016  . Arthritis    "both feet" (10/17/2016)  . Asthma   . Breast cancer ([place]) 01/06/12   left breast invasive ductal ca,dcis,ER/PR=+,  . Breast  mass in female   . Cataract   . Clotting disorder ([place])   . Diastolic congestive heart failure ([place])   . DVT (deep venous thrombosis) ([place]) 1990's?   LLE  . Endometrial ca ([place]) dx'd 2020  . Family history of breast cancer   . Family history of prostate cancer   . Hypertension   . Insulin resistance   . Pulmonary embolism ([place]) ~ 2012  . Radiation 02/12/12 -03/12/12   left breast, total 50gy  . Right ankle pain   . Shortness of breath   . Venous stasis of lower extremity   . Wears  dentures    upper  . Wears glasses   . Wears partial dentures    lower    Past Surgical History:  Procedure Laterality Date  . BREAST BIOPSY Left 11/03/11   10 o'clock=benign breast parenchyma  . BREAST LUMPECTOMY Left 01/06/12    Dr. Erroll Luna  . INGUINAL HERNIA REPAIR Right 03/22/2019   Procedure: RIGHT INGUINAL HERNIA REPAIR WITH MESH;  Surgeon: Erroll Luna, MD;  Location: [facility];  Service: General;  Laterality: Right;  tap block  . IR IMAGING GUIDED PORT INSERTION  08/06/2018    Family History  Problem Relation Age of Onset  . Heart disease Mother   . Heart attack Mother   . Breast cancer Sister 68  . Parkinson's disease Brother   . Prostate cancer Brother   . Pulmonary embolism Daughter   . Diabetes Daughter   . Pulmonary embolism Daughter   . Breast cancer Sister   . Other Brother        MVA  . Other Brother        MVA  . COPD Brother   . Breast cancer Niece        dx 20s    Social History   Socioeconomic History  . Marital status: Widowed    Spouse name: Not on file  . Number of children: 5  . Years of education: 34  . Highest education level: Associate degree: occupational, Hotel manager, or vocational program  Occupational History  . Occupation: Retired    Fish farm manager: RETIRED  Tobacco Use  . Smoking status: Former Smoker    Packs/day: 0.30    Years: 15.00    Pack years: 4.50    Types: Cigarettes  . Smokeless tobacco: Never Used  . Tobacco comment: "stopped in the 1990s"  Substance and Sexual Activity  . Alcohol use: No  . Drug use: No  . Sexual activity: Not Currently  Other Topics Concern  . Not on file  Social History Narrative      Emergency Contact: daughter, Freddie Breech (c) [phone] or Arnette Schaumann (Loletha Grayer) [phone]   Who lives with you: daughter - Arnette Schaumann and granddaughter   Lives in one level house. Has grab bars in bathroom at tub. No throw rugs. Smoke alarms   Any pets: 2 dogs, pit bull and fox terrier   Diet: Pt has a  varied diet. Does not eat cheese, eats meat, fruit, and vegetables. Drinks tea, water, occasional soda.   Exercise: Pt regularly does low impact aerobic exercises with TV program, walks, stretches, and occasional water aerobics.   Seatbelts: Pt reports wearing seatbelt when in vehicles.    Hobbies: Bingo      Social Determinants of Health   Financial Resource Strain:   . Difficulty of Paying Living Expenses: Not on file  Food Insecurity:   . Worried About Charity fundraiser in the  Last Year: Not on file  . Ran Out of Food in the Last Year: Not on file  Transportation Needs:   . Lack of Transportation (Medical): Not on file  . Lack of Transportation (Non-Medical): Not on file  Physical Activity:   . Days of Exercise per Week: Not on file  . Minutes of Exercise per Session: Not on file  Stress:   . Feeling of Stress : Not on file  Social Connections:   . Frequency of Communication with Friends and Family: Not on file  . Frequency of Social Gatherings with Friends and Family: Not on file  . Attends Religious Services: Not on file  . Active Member of Clubs or Organizations: Not on file  . Attends Archivist Meetings: Not on file  . Marital Status: Not on file  Intimate Partner Violence:   . Fear of Current or Ex-Partner: Not on file  . Emotionally Abused: Not on file  . Physically Abused: Not on file  . Sexually Abused: Not on file    Past Medical History, Surgical history, Social history, and Family history were reviewed and updated as appropriate.   Please see review of systems for further details on the patient's review from today.   Review of Systems:  Review of Systems  Constitutional: Negative for appetite change, chills, diaphoresis and fever.  HENT: Negative for dental problem, mouth sores and trouble swallowing.   Respiratory: Negative for cough, chest tightness and shortness of breath.   Cardiovascular: Negative for chest pain and palpitations.   Gastrointestinal: Negative for constipation, diarrhea, nausea and vomiting.  Neurological: Positive for dizziness and weakness. Negative for syncope and headaches.    Objective:   Physical Exam:  There were no vitals taken for this visit. ECOG: 1  Physical Exam Constitutional:      General: She is not in acute distress.    Appearance: She is not diaphoretic.  HENT:     Head: Normocephalic and atraumatic.  Cardiovascular:     Rate and Rhythm: Normal rate and regular rhythm.     Heart sounds: Normal heart sounds. No murmur. No friction rub. No gallop.   Pulmonary:     Effort: Pulmonary effort is normal. No respiratory distress.     Breath sounds: Normal breath sounds. No wheezing or rales.  Musculoskeletal:     Right lower leg: Edema present.     Left lower leg: Edema present.  Skin:    General: Skin is warm and dry.     Findings: No erythema or rash.  Neurological:     Mental Status: She is alert.     Gait: Gait abnormal (The patient is ambulating with the use of a wheelchair.).  Psychiatric:        Mood and Affect: Mood normal.        Behavior: Behavior normal.        Thought Content: Thought content normal.        Judgment: Judgment normal.     Lab Review:     Component Value Date/Time   NA 140 07/18/2019 1608   NA 142 12/25/2014 1122   K 4.2 07/18/2019 1608   K 4.1 12/25/2014 1122   CL 102 07/18/2019 1608   CL 102 10/04/2012 1159   CO2 26 07/18/2019 1608   CO2 29 12/25/2014 1122   GLUCOSE 114 (H) 07/18/2019 1608   GLUCOSE 99 12/25/2014 1122   GLUCOSE 116 (H) 10/04/2012 1159   BUN 37 (H) 07/18/2019 1608  BUN 13.7 12/25/2014 1122   CREATININE 1.03 (H) 07/18/2019 1608   CREATININE 0.96 (H) 02/27/2016 1530   CREATININE 0.9 12/25/2014 1122   CALCIUM 10.1 07/18/2019 1608   CALCIUM 10.1 12/25/2014 1122   PROT 8.2 (H) 07/18/2019 1608   PROT 7.8 12/25/2014 1122   ALBUMIN 3.2 (L) 07/18/2019 1608   ALBUMIN 3.4 (L) 12/25/2014 1122   AST 29 07/18/2019 1608    AST 21 12/25/2014 1122   ALT 21 07/18/2019 1608   ALT 17 12/25/2014 1122   ALKPHOS 84 07/18/2019 1608   ALKPHOS 99 12/25/2014 1122   BILITOT 0.3 07/18/2019 1608   BILITOT 0.41 12/25/2014 1122   GFRNONAA 51 (L) 07/18/2019 1608   GFRNONAA 58 (L) 02/27/2016 1530   GFRAA 59 (L) 07/18/2019 1608   GFRAA 66 02/27/2016 1530       Component Value Date/Time   WBC 7.2 07/18/2019 1544   WBC 7.7 05/25/2019 0940   RBC 3.30 (L) 07/18/2019 1544   HGB 10.7 (L) 07/18/2019 1544   HGB 11.4 (L) 12/25/2014 1122   HCT 32.2 (L) 07/18/2019 1544   HCT 35.0 12/25/2014 1122   PLT 211 07/18/2019 1544   PLT 246 12/25/2014 1122   MCV 97.6 07/18/2019 1544   MCV 91.9 12/25/2014 1122   MCH 32.4 07/18/2019 1544   MCHC 33.2 07/18/2019 1544   RDW 12.5 07/18/2019 1544   RDW 13.5 12/25/2014 1122   LYMPHSABS 0.8 07/18/2019 1544   LYMPHSABS 1.5 12/25/2014 1122   MONOABS 1.0 07/18/2019 1544   MONOABS 0.6 12/25/2014 1122   EOSABS 0.1 07/18/2019 1544   EOSABS 0.2 12/25/2014 1122   BASOSABS 0.0 07/18/2019 1544   BASOSABS 0.0 12/25/2014 1122   -------------------------------  Imaging from last 24 hours (if applicable):  Radiology interpretation: No results found.

## 2019-07-19 NOTE — Progress Notes (Signed)
Called and spoke with patient's daughter regarding financial assistance after referral received by Elmo Putt. Introduced myself as Arboriculturist and advised what is needed to apply for one-time $1000 [employer].  Will meet with them on 07/21/19 at registration.

## 2019-07-20 ENCOUNTER — Other Ambulatory Visit: Payer: Self-pay

## 2019-07-20 ENCOUNTER — Telehealth: Payer: Self-pay | Admitting: Hematology and Oncology

## 2019-07-20 MED ORDER — FLUTICASONE-SALMETEROL 100-50 MCG/DOSE IN AEPB: INHALATION_SPRAY | RESPIRATORY_TRACT | 2 refills | Status: DC

## 2019-07-20 NOTE — Telephone Encounter (Signed)
Scheduled appt per 2/18 sch message - pt aware of appt date and time

## 2019-07-21 ENCOUNTER — Ambulatory Visit: Payer: Medicare Other | Admitting: Hematology and Oncology

## 2019-07-21 ENCOUNTER — Ambulatory Visit: Payer: Medicare Other

## 2019-07-21 ENCOUNTER — Other Ambulatory Visit: Payer: Medicare Other

## 2019-07-26 ENCOUNTER — Inpatient Hospital Stay: Payer: Medicare Other

## 2019-07-26 ENCOUNTER — Encounter: Payer: Self-pay | Admitting: Hematology and Oncology

## 2019-07-26 ENCOUNTER — Other Ambulatory Visit: Payer: Self-pay

## 2019-07-26 ENCOUNTER — Inpatient Hospital Stay (HOSPITAL_BASED_OUTPATIENT_CLINIC_OR_DEPARTMENT_OTHER): Payer: Medicare Other | Admitting: Hematology and Oncology

## 2019-07-26 ENCOUNTER — Ambulatory Visit: Payer: Self-pay | Admitting: *Deleted

## 2019-07-26 DIAGNOSIS — Z5112 Encounter for antineoplastic immunotherapy: Secondary | ICD-10-CM | POA: Diagnosis not present

## 2019-07-26 DIAGNOSIS — I129 Hypertensive chronic kidney disease with stage 1 through stage 4 chronic kidney disease, or unspecified chronic kidney disease: Secondary | ICD-10-CM | POA: Diagnosis not present

## 2019-07-26 DIAGNOSIS — C55 Malignant neoplasm of uterus, part unspecified: Secondary | ICD-10-CM

## 2019-07-26 DIAGNOSIS — M25569 Pain in unspecified knee: Secondary | ICD-10-CM

## 2019-07-26 DIAGNOSIS — Z79899 Other long term (current) drug therapy: Secondary | ICD-10-CM | POA: Diagnosis not present

## 2019-07-26 DIAGNOSIS — I10 Essential (primary) hypertension: Secondary | ICD-10-CM | POA: Diagnosis not present

## 2019-07-26 DIAGNOSIS — D6481 Anemia due to antineoplastic chemotherapy: Secondary | ICD-10-CM

## 2019-07-26 DIAGNOSIS — G8929 Other chronic pain: Secondary | ICD-10-CM | POA: Insufficient documentation

## 2019-07-26 DIAGNOSIS — T451X5A Adverse effect of antineoplastic and immunosuppressive drugs, initial encounter: Secondary | ICD-10-CM

## 2019-07-26 DIAGNOSIS — D631 Anemia in chronic kidney disease: Secondary | ICD-10-CM | POA: Diagnosis not present

## 2019-07-26 DIAGNOSIS — E860 Dehydration: Secondary | ICD-10-CM | POA: Diagnosis not present

## 2019-07-26 DIAGNOSIS — N183 Chronic kidney disease, stage 3 unspecified: Secondary | ICD-10-CM

## 2019-07-26 LAB — CBC WITH DIFFERENTIAL (CANCER CENTER ONLY)
Abs Immature Granulocytes: 0.02 10*3/uL (ref 0.00–0.07)
Basophils Absolute: 0 10*3/uL (ref 0.0–0.1)
Basophils Relative: 0 %
Eosinophils Absolute: 0.2 10*3/uL (ref 0.0–0.5)
Eosinophils Relative: 4 %
HCT: 32.1 % — ABNORMAL LOW (ref 36.0–46.0)
Hemoglobin: 10.5 g/dL — ABNORMAL LOW (ref 12.0–15.0)
Immature Granulocytes: 0 %
Lymphocytes Relative: 17 %
Lymphs Abs: 1.1 10*3/uL (ref 0.7–4.0)
MCH: 32.3 pg (ref 26.0–34.0)
MCHC: 32.7 g/dL (ref 30.0–36.0)
MCV: 98.8 fL (ref 80.0–100.0)
Monocytes Absolute: 1 10*3/uL (ref 0.1–1.0)
Monocytes Relative: 15 %
Neutro Abs: 4.3 10*3/uL (ref 1.7–7.7)
Neutrophils Relative %: 64 %
Platelet Count: 231 10*3/uL (ref 150–400)
RBC: 3.25 MIL/uL — ABNORMAL LOW (ref 3.87–5.11)
RDW: 12.2 % (ref 11.5–15.5)
WBC Count: 6.7 10*3/uL (ref 4.0–10.5)
nRBC: 0 % (ref 0.0–0.2)

## 2019-07-26 LAB — CMP (CANCER CENTER ONLY)
ALT: 19 U/L (ref 0–44)
AST: 26 U/L (ref 15–41)
Albumin: 3.1 g/dL — ABNORMAL LOW (ref 3.5–5.0)
Alkaline Phosphatase: 69 U/L (ref 38–126)
Anion gap: 7 (ref 5–15)
BUN: 31 mg/dL — ABNORMAL HIGH (ref 8–23)
CO2: 28 mmol/L (ref 22–32)
Calcium: 9.7 mg/dL (ref 8.9–10.3)
Chloride: 104 mmol/L (ref 98–111)
Creatinine: 0.87 mg/dL (ref 0.44–1.00)
GFR, Est AFR Am: >60 mL/min (ref >60)
GFR, Estimated: >60 mL/min (ref >60)
Glucose, Bld: 99 mg/dL (ref 70–99)
Potassium: 4.2 mmol/L (ref 3.5–5.1)
Sodium: 139 mmol/L (ref 135–145)
Total Bilirubin: 0.3 mg/dL (ref 0.3–1.2)
Total Protein: 7.6 g/dL (ref 6.5–8.1)

## 2019-07-26 LAB — TOTAL PROTEIN, URINE DIPSTICK: Protein, ur: 30 mg/dL — AB

## 2019-07-26 LAB — TSH: TSH: <0.08 u[IU]/mL — ABNORMAL LOW (ref 0.308–3.960)

## 2019-07-26 MED ORDER — HEPARIN SOD (PORK) LOCK FLUSH 100 UNIT/ML IV SOLN
500.0000 [IU] | Freq: Once | INTRAVENOUS | Status: AC | PRN
  Administered 2019-07-26: 500 [IU]
  Filled 2019-07-26: qty 5

## 2019-07-26 MED ORDER — SODIUM CHLORIDE 0.9 % IV SOLN
200.0000 mg | Freq: Once | INTRAVENOUS | Status: AC
  Administered 2019-07-26: 200 mg via INTRAVENOUS
  Filled 2019-07-26: qty 8

## 2019-07-26 MED ORDER — SODIUM CHLORIDE 0.9% FLUSH
10.0000 mL | INTRAVENOUS | Status: DC | PRN
  Administered 2019-07-26: 10 mL
  Filled 2019-07-26: qty 10

## 2019-07-26 MED ORDER — OXYCODONE HCL 5 MG PO TABS: 5.0000 mg | ORAL_TABLET | Freq: Four times a day (QID) | ORAL | 0 refills | Status: DC | PRN

## 2019-07-26 MED ORDER — SODIUM CHLORIDE 0.9 % IV SOLN
Freq: Once | INTRAVENOUS | Status: AC
  Filled 2019-07-26: qty 250

## 2019-07-26 NOTE — Patient Instructions (Signed)
[facility] Discharge Instructions for Patients Receiving Chemotherapy  Today you received the following chemotherapy agents:  Keytruda.  To help prevent nausea and vomiting after your treatment, we encourage you to take your nausea medication as directed.   If you develop nausea and vomiting that is not controlled by your nausea medication, call the clinic.   BELOW ARE SYMPTOMS THAT SHOULD BE REPORTED IMMEDIATELY:  *FEVER GREATER THAN 100.5 F  *CHILLS WITH OR WITHOUT FEVER  NAUSEA AND VOMITING THAT IS NOT CONTROLLED WITH YOUR NAUSEA MEDICATION  *UNUSUAL SHORTNESS OF BREATH  *UNUSUAL BRUISING OR BLEEDING  TENDERNESS IN MOUTH AND THROAT WITH OR WITHOUT PRESENCE OF ULCERS  *URINARY PROBLEMS  *BOWEL PROBLEMS  UNUSUAL RASH Items with * indicate a potential emergency and should be followed up as soon as possible.  Feel free to call the clinic should you have any questions or concerns. The clinic phone number is [phone].  Please show the CHEMO ALERT CARD at check-in to the Emergency Department and triage nurse.    

## 2019-07-26 NOTE — Assessment & Plan Note (Signed)
Her blood pressure control at home is satisfactory She will continue to check her blood pressure twice a day while on Lenvima

## 2019-07-26 NOTE — Assessment & Plan Note (Signed)
She has chronic baseline anemia due to chronic kidney disease She is mildly symptomatic Recommend observation only for now 

## 2019-07-26 NOTE — Progress Notes (Signed)
Met with patient and daughter([name]) whom brought income documentation for [employer] grant. ° °Patient approved for the one-time $1000 [employer] grant to assist with personal expenses while going through treatment. She has a copy of the approval letter as well as the expense sheet and Outpatient pharmacy information. She received a gift card today. ° °She has my card for any additional financial questions or concerns.    °

## 2019-07-26 NOTE — Telephone Encounter (Signed)
  Answer Assessment - Initial Assessment Questions 1.   NAME of MEDICATION: "What medicine are you calling about?"     Lenvima 2.   QUESTION: "What is your question?"     Accidentally took an additional dose of medication 3.   PRESCRIBING HCP: "Who prescribed it?" Reason: if prescribed by specialist, call should be referred to that group.     Oncologist 4. SYMPTOMS: "Do you have any symptoms?"     No symptoms voiced currently 5. SEVERITY: If symptoms are present, ask "Are they mild, moderate or severe?"     n/a 6.  PREGNANCY:  "Is there any chance that you are pregnant?" "When was your last menstrual period?"     n/a  Protocols used: MEDICATION QUESTION CALL-A-AH

## 2019-07-26 NOTE — Progress Notes (Signed)
[facility] OFFICE PROGRESS NOTE  Patient Care Team: [name], DO as PCP - General (Family Medicine) [name], MD as Consulting Physician (Ophthalmology) [name], DPM as Consulting Physician (Podiatry) [name], [name], MD as Referring Physician (Optometry) [name], MD (Inactive) as Consulting Physician (Orthopedic Surgery) [name], RN as Oncology Nurse Navigator (Oncology)  ASSESSMENT & PLAN:  Uterine cancer [facility]) She tolerated treatment well so far Her blood pressure control at home is satisfactory We will proceed with minimum 4 cycles of treatment before we proceed with repeat imaging study  CKD (chronic kidney disease), stage III ([place]) Her serum creatinine is satisfactory We discussed the importance of adequate hydration and risk factor modification  Essential hypertension Her blood pressure control at home is satisfactory She will continue to check her blood pressure twice a day while on Lenvima  Anemia due to antineoplastic chemotherapy She has chronic baseline anemia due to chronic kidney disease She is mildly symptomatic Recommend observation only for now  Chronic knee pain She had intermittent flare of bilateral knee pain while on treatment She has taken oxycodone and Tylenol as needed I refill her prescriptions today We discussed narcotic refill policy and I warned her about risk of nausea, constipation and sedation   No orders of the defined types were placed in this encounter.   All questions were answered. The patient knows to call the clinic with any problems, questions or concerns. The total time spent in the appointment was 20 minutes encounter with patients including review of chart and various tests results, discussions about plan of care and coordination of care plan   [name], MD 07/26/2019 1:59 PM  INTERVAL HISTORY: Please see below for problem oriented charting. 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 Ultrasound irregular hypoechoic mass 7 x 6 x 5 mm   01/06/2012 Initial Biopsy   Initial biopsy was benign . 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 Cervix, biopsy - HIGH GRADE CARCINOMA. - SEE COMMENT. 2. Endocervix, curettage - HIGH GRADE CARCINOMA. - SEE COMMENT. 3. Endometrium, biopsy - HIGH GRADE CARCINOMA. - SEE COMMENT. Microscopic Comment 1. - 3.  The carcinoma in the three specimens is morphologically similar and has features suggestive of papillary serous carcinoma.   07/26/2018 Tumor Marker   Patient's tumor was tested for the following markers:  CA-125 Results of the tumor marker test revealed 835   07/28/2018 Imaging   1. Several (at least 10) solid pulmonary nodules scattered throughout both lungs are new since 2012 chest CT, likely representing pulmonary metastases, largest 6 mm, below PET resolution. 2. Redemonstration of hypodense 2.5 cm segment 7 right liver lobe mass, indeterminate, suspicious for liver metastasis. 3. 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 4. Three-vessel coronary atherosclerosis.  Aortic Atherosclerosis (ICD10-I70.0).   08/03/2018 Cancer Staging   Staging form: Corpus Uteri - Carcinoma and Carcinosarcoma, AJCC 8th Edition - Clinical: FIGO Stage IVB (cT3b, cN2, cM1) - Signed by [name], MD on 08/03/2018   08/05/2018 Tumor Marker   Patient's tumor was tested for the following markers: CA-125 Results of the tumor marker test revealed 901   08/06/2018 Procedure   Placement of single lumen port a cath via right internal jugular vein. The catheter tip lies at the cavo-atrial junction. A power injectable port a cath was placed and is ready for immediate use.    08/11/2018 - 01/12/2019 Chemotherapy   The patient had carboplatin and taxol x 8 cycles   09/01/2018 Tumor Marker   Patient's tumor was tested for the following markers: CA-125 Results of the tumor marker test revealed 406   09/09/2018 Genetic Testing   The Common Hereditary Cancers Panel offered by [facility] includes sequencing and/or deletion duplication testing of the following 48 genes: APC, ATM, AXIN2, BARD1, BMPR1A, BRCA1, BRCA2, BRIP1, CDH1, CDKN2A (p14ARF), CDKN2A (p16INK4a), CKD4, CHEK2, CTNNA1, DICER1, EPCAM (Deletion/duplication testing only), GREM1 (promoter region deletion/duplication testing only), KIT, MEN1, MLH1, MSH2, MSH3, MSH6, MUTYH, NBN, NF1, NHTL1, PALB2, PDGFRA, PMS2, POLD1, POLE, PTEN, RAD50, RAD51C, RAD51D, RNF43, SDHB, SDHC, SDHD, SMAD4, SMARCA4. STK11, TP53, TSC1, TSC2, and VHL.  The following genes were  evaluated for sequence changes only: SDHA and HOXB13 c.251G>A variant only.  Results: Negative, no pathogenic variants identified. The report date is 09/09/2018.     09/22/2018 Tumor Marker   Patient's tumor was tested for the following markers: CA-125 Results of the tumor marker test revealed 110   10/12/2018 Tumor Marker   Patient's tumor was tested for the following markers: CA-125 Results of the tumor marker test revealed 55.3   10/12/2018 Imaging   1. Mild decrease in pulmonary metastases, liver metastases, and metastatic abdominal and pelvic lymphadenopathy. 2. Decreased size of complex cystic lesion in left presacral region. 3. No new or progressive metastatic disease identified. 4. Stable small right inguinal hernia containing small portion of urinary bladder.   11/03/2018 Tumor Marker   Patient's tumor was tested for the following markers: CA-125 Results of the tumor marker test revealed 34.3    Genetic Testing   Patient has genetic testing done for HER2. Results revealed patient has the following: HER2 - negative from pathology from 07/26/18.   12/20/2018 Imaging   1. Possible new omental nodule. 2. Improving abdominal and pelvic retroperitoneal adenopathy. 3. Stable pulmonary and hepatic metastatic disease. 4. Left perirectal/presacral fluid collection has decreased in size. 5. Right inguinal hernia contains a small portion of the bladder, as before. 6. Aortic atherosclerosis (ICD10-170.0). Coronary artery calcification   12/21/2018 Tumor Marker   Patient's tumor was tested for the following markers: CA-125 Results of the tumor marker test revealed 15.6   01/15/2019 Imaging   Ct abdomen  and pelvis 1. Small hiatal hernia with fluid in the distal esophagus, which may represent reflux or esophagitis. No other acute findings. 2. Right inguinal hernia contains anterolateral aspect of the urinary bladder, unchanged in appearance from prior. Mild edema within the hernia sac is  similar. 3. Unchanged small low-density lesions in the liver. 4. Unchanged retroperitoneal and bilateral iliac lymph nodes. Small ventral abdominal omental nodule tentatively identified and unchanged from prior.   Aortic Atherosclerosis (ICD10-I70.0).   02/02/2019 Tumor Marker   Patient's tumor was tested for the following markers: CA-125 Results of the tumor marker test revealed 7.8   03/22/2019 Surgery   Preoperative diagnosis: History of incarcerated right inguinal hernia   Postop diagnosis: Incarcerated right inguinal hernia with preperitoneal fat without strangulation or obstruction   Procedure: Repair of right inguinal hernia with ultra Pro hernia system mesh   Surgeon: Erroll Luna, MD   03/31/2019 Tumor Marker   Patient's tumor was tested for the following markers: CA-`125 Results of the tumor marker test revealed 24.4.   05/25/2019 Tumor Marker   Patient's tumor was tested for the following markers: CA-125 Results of the tumor marker test revealed 287   05/25/2019 Imaging   New solid bilateral adnexal masses, consistent with metastatic disease.   New peritoneal soft tissue nodules in the anterior right pelvis, suspicious for peritoneal carcinomatosis. No evidence of ascites.   No significant change in shotty sub-cm abdominal retroperitoneal and bilateral iliac lymph nodes.   06/09/2019 -  Chemotherapy   The patient had pembrolizumab (KEYTRUDA) 200 mg in sodium chloride 0.9 % 50 mL chemo infusion, 200 mg, Intravenous, Once, 3 of 6 cycles Administration: 200 mg (06/09/2019), 200 mg (06/30/2019)  for chemotherapy treatment.    06/09/2019 Tumor Marker   Patient's tumor was tested for the following markers: CA-125 Results of the tumor marker test revealed 468     REVIEW OF SYSTEMS:   Constitutional: Denies fevers, chills or abnormal weight loss Eyes: Denies blurriness of vision Ears, nose, mouth, throat, and face: Denies mucositis or sore throat Respiratory: Denies  cough, dyspnea or wheezes Cardiovascular: Denies palpitation, chest discomfort  Gastrointestinal:  Denies nausea, heartburn or change in bowel habits Skin: Denies abnormal skin rashes Lymphatics: Denies new lymphadenopathy or easy bruising Neurological:Denies numbness, tingling or new weaknesses Behavioral/Psych: Mood is stable, no new changes  All other systems were reviewed with the patient and are negative.  I have reviewed the past medical history, past surgical history, social history and family history with the patient and they are unchanged from previous note.  ALLERGIES:  has No Known Allergies.  MEDICATIONS:  Current Outpatient Medications  Medication Sig Dispense Refill  . acetaminophen (TYLENOL) 500 MG tablet Take 1 tablet (500 mg total) by mouth every 6 (six) hours as needed. (Patient taking differently: Take 1,000 mg by mouth every 6 (six) hours as needed for moderate pain. ) 30 tablet 3  . Calcium Carbonate (CALTRATE 600 PO) Take 1 tablet by mouth 2 (two) times daily.     . enalapril (VASOTEC) 20 MG tablet TAKE 1 TABLET(20 MG) BY MOUTH TWICE DAILY (Patient taking differently: Take 20 mg by mouth daily. ) 180 tablet 2  . Fluticasone-Salmeterol (ADVAIR DISKUS) 100-50 MCG/DOSE AEPB USE 1 INHALATION BY MOUTH TWICE DAILY 60 each 2  . furosemide (LASIX) 40 MG tablet TAKE 1 TABLET BY MOUTH DAILY AS NEEDED FOR LEG SWELLING (Patient taking differently: Take 40 mg by mouth daily as needed (leg swelling). ) 90 tablet 2  . hydrochlorothiazide (  HYDRODIURIL) 25 MG tablet TAKE 1 TABLET(25 MG) BY MOUTH DAILY (Patient taking differently: Take 25 mg by mouth daily. ) 90 tablet 1  . lenvatinib 10 mg daily dose (LENVIMA) capsule Take 1 capsule (10 mg total) by mouth daily. 30 capsule 11  . lidocaine-prilocaine (EMLA) cream Apply to affected area once (Patient taking differently: Apply 1 application topically daily as needed. Apply to affected area once) 30 g 3  . Multiple Vitamin (MULTIVITAMIN)  tablet Take 1 tablet by mouth daily.    . Multiple Vitamins-Minerals (ICAPS AREDS 2 PO) Take 1 capsule by mouth 2 (two) times daily.    . ondansetron (ZOFRAN) 8 MG tablet Take 1 tablet (8 mg total) by mouth every 8 (eight) hours as needed. Start on the third day after chemotherapy. 30 tablet 1  . oxyCODONE (OXY IR/ROXICODONE) 5 MG immediate release tablet Take 1 tablet (5 mg total) by mouth every 6 (six) hours as needed for severe pain. 40 tablet 0  . polyethylene glycol (MIRALAX / GLYCOLAX) 17 g packet Take 17 g by mouth daily as needed for moderate constipation.    . prochlorperazine (COMPAZINE) 10 MG tablet Take 1 tablet (10 mg total) by mouth every 6 (six) hours as needed (Nausea or vomiting). (Patient not taking: Reported on 02/07/2019) 30 tablet 1  . rivaroxaban (XARELTO) 20 MG TABS tablet Take 1 tablet (20 mg total) by mouth daily with supper. 90 tablet 1  . Vitamin D, Cholecalciferol, 1000 units CAPS Take 1,000 Units by mouth daily.      No current facility-administered medications for this visit.    PHYSICAL EXAMINATION: ECOG PERFORMANCE STATUS: 1 - Symptomatic but completely ambulatory  Vitals:   07/26/19 1157  BP: (!) 133/58  Pulse: 91  Resp: 18  Temp: 97.8 F (36.6 C)  SpO2: 100%   Filed Weights   07/26/19 1157  Weight: 235 lb 9.6 oz (106.9 kg)    GENERAL:alert, no distress and comfortable SKIN: skin color, texture, turgor are normal, no rashes or significant lesions EYES: normal, Conjunctiva are pink and non-injected, sclera clear OROPHARYNX:no exudate, no erythema and lips, buccal mucosa, and tongue normal  NECK: supple, thyroid normal size, non-tender, without nodularity LYMPH:  no palpable lymphadenopathy in the cervical, axillary or inguinal LUNGS: clear to auscultation and percussion with normal breathing effort HEART: regular rate & rhythm and no murmurs with stable bilateral lower extremity edema ABDOMEN:abdomen soft, non-tender and normal bowel  sounds Musculoskeletal:no cyanosis of digits and no clubbing  NEURO: alert & oriented x 3 with fluent speech, no focal motor/sensory deficits  LABORATORY DATA:  I have reviewed the data as listed    Component Value Date/Time   NA 139 07/26/2019 1117   NA 142 12/25/2014 1122   K 4.2 07/26/2019 1117   K 4.1 12/25/2014 1122   CL 104 07/26/2019 1117   CL 102 10/04/2012 1159   CO2 28 07/26/2019 1117   CO2 29 12/25/2014 1122   GLUCOSE 99 07/26/2019 1117   GLUCOSE 99 12/25/2014 1122   GLUCOSE 116 (H) 10/04/2012 1159   BUN 31 (H) 07/26/2019 1117   BUN 13.7 12/25/2014 1122   CREATININE 0.87 07/26/2019 1117   CREATININE 0.96 (H) 02/27/2016 1530   CREATININE 0.9 12/25/2014 1122   CALCIUM 9.7 07/26/2019 1117   CALCIUM 10.1 12/25/2014 1122   PROT 7.6 07/26/2019 1117   PROT 7.8 12/25/2014 1122   ALBUMIN 3.1 (L) 07/26/2019 1117   ALBUMIN 3.4 (L) 12/25/2014 1122   AST 26 07/26/2019 1117  AST 21 12/25/2014 1122   ALT 19 07/26/2019 1117   ALT 17 12/25/2014 1122   ALKPHOS 69 07/26/2019 1117   ALKPHOS 99 12/25/2014 1122   BILITOT 0.3 07/26/2019 1117   BILITOT 0.41 12/25/2014 1122   GFRNONAA >60 07/26/2019 1117   GFRNONAA 58 (L) 02/27/2016 1530   GFRAA >60 07/26/2019 1117   GFRAA 66 02/27/2016 1530    No results found for: SPEP, UPEP  Lab Results  Component Value Date   WBC 6.7 07/26/2019   NEUTROABS 4.3 07/26/2019   HGB 10.5 (L) 07/26/2019   HCT 32.1 (L) 07/26/2019   MCV 98.8 07/26/2019   PLT 231 07/26/2019      Chemistry      Component Value Date/Time   NA 139 07/26/2019 1117   NA 142 12/25/2014 1122   K 4.2 07/26/2019 1117   K 4.1 12/25/2014 1122   CL 104 07/26/2019 1117   CL 102 10/04/2012 1159   CO2 28 07/26/2019 1117   CO2 29 12/25/2014 1122   BUN 31 (H) 07/26/2019 1117   BUN 13.7 12/25/2014 1122   CREATININE 0.87 07/26/2019 1117   CREATININE 0.96 (H) 02/27/2016 1530   CREATININE 0.9 12/25/2014 1122      Component Value Date/Time   CALCIUM 9.7 07/26/2019  1117   CALCIUM 10.1 12/25/2014 1122   ALKPHOS 69 07/26/2019 1117   ALKPHOS 99 12/25/2014 1122   AST 26 07/26/2019 1117   AST 21 12/25/2014 1122   ALT 19 07/26/2019 1117   ALT 17 12/25/2014 1122   BILITOT 0.3 07/26/2019 1117   BILITOT 0.41 12/25/2014 1122

## 2019-07-26 NOTE — Assessment & Plan Note (Signed)
Her serum creatinine is satisfactory We discussed the importance of adequate hydration and risk factor modification 

## 2019-07-26 NOTE — Assessment & Plan Note (Signed)
She had intermittent flare of bilateral knee pain while on treatment She has taken oxycodone and Tylenol as needed I refill her prescriptions today We discussed narcotic refill policy and I warned her about risk of nausea, constipation and sedation

## 2019-07-26 NOTE — Patient Instructions (Signed)

## 2019-07-26 NOTE — Assessment & Plan Note (Signed)
She tolerated treatment well so far Her blood pressure control at home is satisfactory We will proceed with minimum 4 cycles of treatment before we proceed with repeat imaging study

## 2019-07-26 NOTE — Telephone Encounter (Addendum)
Patient calling with concerns of taking 2nd dose of chemo capsule, Lenvima. Pt states that she usually takes the capsule every morning but accidentally took a 2nd dose with her evening medications around 6pm.Pt advised that she would need to notify her oncologist as well Poison Control called and spoke with the pharmacist Janett Billow. Conference call initiated with the patient for further recommendations.   Reason for Disposition . [1] DOUBLE DOSE (an extra dose or lesser amount) of prescription drug AND [2] any symptoms (e.g., dizziness, nausea, pain, sleepiness)  Protocols used: MEDICATION QUESTION CALL-A-AH

## 2019-07-27 ENCOUNTER — Telehealth: Payer: Self-pay | Admitting: Hematology and Oncology

## 2019-07-27 ENCOUNTER — Telehealth: Payer: Self-pay

## 2019-07-27 LAB — T4: T4, Total: 16.6 ug/dL — ABNORMAL HIGH (ref 4.5–12.0)

## 2019-07-27 LAB — CA 125: Cancer Antigen (CA) 125: 282 U/mL — ABNORMAL HIGH (ref 0.0–38.1)

## 2019-07-27 NOTE — Telephone Encounter (Signed)
Scheduled 3/16 appt per 2/23 sch msg. Pt's daughter confirmed the appointment date and times.

## 2019-07-27 NOTE — Telephone Encounter (Signed)
Erin Esparza called, she took the Lenvima at 9:30 am yesterday and then accidentally took it again at 6 pm last night. She called the ED and poison control. She was told to call the office. I told her I would call her back and to not take the [facility] for now. She is feeling fine and her blood pressure is good. I will contact pharmacist, [name] and call her back. She verbalized understanding.

## 2019-07-27 NOTE — Telephone Encounter (Signed)
Called back. Per A. Hollice Espy, Pharmacist. It is also helpful that the doses were spaced out. She can resume a normal schedule today or if she is worried, she can hold today and resume tomorrow.  She verbalized understanding. She will hold today and resume tomorrow.

## 2019-07-29 MED FILL — LENVIMA 10 MG DAILY DOSE: 10 | 30 days supply | Qty: 30 | Fill #2

## 2019-08-09 DIAGNOSIS — H25813 Combined forms of age-related cataract, bilateral: Secondary | ICD-10-CM | POA: Diagnosis not present

## 2019-08-09 DIAGNOSIS — H34212 Partial retinal artery occlusion, left eye: Secondary | ICD-10-CM | POA: Diagnosis not present

## 2019-08-09 DIAGNOSIS — H40013 Open angle with borderline findings, low risk, bilateral: Secondary | ICD-10-CM | POA: Diagnosis not present

## 2019-08-09 DIAGNOSIS — H40033 Anatomical narrow angle, bilateral: Secondary | ICD-10-CM | POA: Diagnosis not present

## 2019-08-09 DIAGNOSIS — H353131 Nonexudative age-related macular degeneration, bilateral, early dry stage: Secondary | ICD-10-CM | POA: Diagnosis not present

## 2019-08-11 ENCOUNTER — Other Ambulatory Visit: Payer: Medicare Other

## 2019-08-11 ENCOUNTER — Ambulatory Visit: Payer: Medicare Other | Admitting: Hematology and Oncology

## 2019-08-11 ENCOUNTER — Ambulatory Visit: Payer: Medicare Other

## 2019-08-16 ENCOUNTER — Encounter: Payer: Self-pay | Admitting: Hematology and Oncology

## 2019-08-16 ENCOUNTER — Inpatient Hospital Stay: Payer: Medicare Other

## 2019-08-16 ENCOUNTER — Telehealth: Payer: Self-pay | Admitting: Hematology and Oncology

## 2019-08-16 ENCOUNTER — Other Ambulatory Visit: Payer: Self-pay

## 2019-08-16 ENCOUNTER — Inpatient Hospital Stay: Payer: Medicare Other | Attending: Gynecologic Oncology

## 2019-08-16 ENCOUNTER — Inpatient Hospital Stay (HOSPITAL_BASED_OUTPATIENT_CLINIC_OR_DEPARTMENT_OTHER): Payer: Medicare Other | Admitting: Hematology and Oncology

## 2019-08-16 VITALS — BP 140/66 | HR 86 | Temp 97.9°F | Resp 18 | Ht 67.0 in | Wt 232.4 lb

## 2019-08-16 DIAGNOSIS — T451X5A Adverse effect of antineoplastic and immunosuppressive drugs, initial encounter: Secondary | ICD-10-CM | POA: Diagnosis not present

## 2019-08-16 DIAGNOSIS — C55 Malignant neoplasm of uterus, part unspecified: Secondary | ICD-10-CM

## 2019-08-16 DIAGNOSIS — D6481 Anemia due to antineoplastic chemotherapy: Secondary | ICD-10-CM

## 2019-08-16 DIAGNOSIS — N189 Chronic kidney disease, unspecified: Secondary | ICD-10-CM | POA: Insufficient documentation

## 2019-08-16 DIAGNOSIS — D631 Anemia in chronic kidney disease: Secondary | ICD-10-CM | POA: Diagnosis not present

## 2019-08-16 DIAGNOSIS — Z5112 Encounter for antineoplastic immunotherapy: Secondary | ICD-10-CM | POA: Insufficient documentation

## 2019-08-16 DIAGNOSIS — I129 Hypertensive chronic kidney disease with stage 1 through stage 4 chronic kidney disease, or unspecified chronic kidney disease: Secondary | ICD-10-CM | POA: Insufficient documentation

## 2019-08-16 DIAGNOSIS — I10 Essential (primary) hypertension: Secondary | ICD-10-CM

## 2019-08-16 DIAGNOSIS — C541 Malignant neoplasm of endometrium: Secondary | ICD-10-CM | POA: Diagnosis present

## 2019-08-16 LAB — CMP (CANCER CENTER ONLY)
ALT: 12 U/L (ref 0–44)
AST: 26 U/L (ref 15–41)
Albumin: 3 g/dL — ABNORMAL LOW (ref 3.5–5.0)
Alkaline Phosphatase: 68 U/L (ref 38–126)
Anion gap: 10 (ref 5–15)
BUN: 15 mg/dL (ref 8–23)
CO2: 28 mmol/L (ref 22–32)
Calcium: 9.3 mg/dL (ref 8.9–10.3)
Chloride: 101 mmol/L (ref 98–111)
Creatinine: 0.91 mg/dL (ref 0.44–1.00)
GFR, Est AFR Am: >60 mL/min (ref >60)
GFR, Estimated: 60 mL/min — ABNORMAL LOW (ref >60)
Glucose, Bld: 97 mg/dL (ref 70–99)
Potassium: 4 mmol/L (ref 3.5–5.1)
Sodium: 139 mmol/L (ref 135–145)
Total Bilirubin: 0.3 mg/dL (ref 0.3–1.2)
Total Protein: 7.2 g/dL (ref 6.5–8.1)

## 2019-08-16 LAB — TSH: TSH: <0.08 u[IU]/mL — ABNORMAL LOW (ref 0.308–3.960)

## 2019-08-16 LAB — CBC WITH DIFFERENTIAL (CANCER CENTER ONLY)
Abs Immature Granulocytes: 0.02 10*3/uL (ref 0.00–0.07)
Basophils Absolute: 0 10*3/uL (ref 0.0–0.1)
Basophils Relative: 0 %
Eosinophils Absolute: 0.4 10*3/uL (ref 0.0–0.5)
Eosinophils Relative: 6 %
HCT: 34.4 % — ABNORMAL LOW (ref 36.0–46.0)
Hemoglobin: 11.2 g/dL — ABNORMAL LOW (ref 12.0–15.0)
Immature Granulocytes: 0 %
Lymphocytes Relative: 17 %
Lymphs Abs: 1.2 10*3/uL (ref 0.7–4.0)
MCH: 31.6 pg (ref 26.0–34.0)
MCHC: 32.6 g/dL (ref 30.0–36.0)
MCV: 97.2 fL (ref 80.0–100.0)
Monocytes Absolute: 0.9 10*3/uL (ref 0.1–1.0)
Monocytes Relative: 13 %
Neutro Abs: 4.4 10*3/uL (ref 1.7–7.7)
Neutrophils Relative %: 64 %
Platelet Count: 190 10*3/uL (ref 150–400)
RBC: 3.54 MIL/uL — ABNORMAL LOW (ref 3.87–5.11)
RDW: 12.8 % (ref 11.5–15.5)
WBC Count: 7 10*3/uL (ref 4.0–10.5)
nRBC: 0 % (ref 0.0–0.2)

## 2019-08-16 LAB — TOTAL PROTEIN, URINE DIPSTICK

## 2019-08-16 MED ORDER — SODIUM CHLORIDE 0.9% FLUSH
10.0000 mL | Freq: Once | INTRAVENOUS | Status: AC
  Administered 2019-08-16: 10 mL
  Filled 2019-08-16: qty 10

## 2019-08-16 MED ORDER — SODIUM CHLORIDE 0.9 % IV SOLN
Freq: Once | INTRAVENOUS | Status: AC
  Filled 2019-08-16: qty 250

## 2019-08-16 MED ORDER — HEPARIN SOD (PORK) LOCK FLUSH 100 UNIT/ML IV SOLN
500.0000 [IU] | Freq: Once | INTRAVENOUS | Status: AC | PRN
  Administered 2019-08-16: 500 [IU]
  Filled 2019-08-16: qty 5

## 2019-08-16 MED ORDER — SODIUM CHLORIDE 0.9 % IV SOLN
200.0000 mg | Freq: Once | INTRAVENOUS | Status: AC
  Administered 2019-08-16: 200 mg via INTRAVENOUS
  Filled 2019-08-16: qty 8

## 2019-08-16 MED ORDER — SODIUM CHLORIDE 0.9% FLUSH
10.0000 mL | INTRAVENOUS | Status: DC | PRN
  Administered 2019-08-16: 10 mL
  Filled 2019-08-16: qty 10

## 2019-08-16 NOTE — Patient Instructions (Signed)

## 2019-08-16 NOTE — [facility] Notes (Signed)
[facility] OFFICE PROGRESS NOTE  Patient Care Team: [name], DO as PCP - General (Family Medicine) [name], MD as Consulting Physician (Ophthalmology) [name], DPM as Consulting Physician (Podiatry) [name], [name], MD as Referring Physician (Optometry) [name], MD (Inactive) as Consulting Physician (Orthopedic Surgery) [name], RN as Oncology Nurse Navigator (Oncology)  ASSESSMENT & PLAN:  Uterine cancer [facility]) She tolerated treatment well so far Her blood pressure control at home is satisfactory We will proceed with treatment today I plan to order CT imaging to be done prior to her next treatment  Anemia due to antineoplastic chemotherapy She has chronic baseline anemia due to chronic kidney disease She is mildly symptomatic Recommend observation only for now  Essential hypertension Her blood pressure control at home is satisfactory She will continue to check her blood pressure twice a day while on [place] This Encounter  Procedures  . CT ABDOMEN PELVIS W CONTRAST    Standing Status:   Future    Standing Expiration Date:   08/15/2020    Order Specific Question:   If indicated for the ordered procedure, I authorize the administration of contrast media per Radiology protocol    Answer:   Yes    Order Specific Question:   Preferred imaging location?    Answer:   [facility]    Order Specific Question:   Radiology Contrast Protocol - do NOT remove file path    Answer:   [email or web address]    Order Specific Question:   ** REASON FOR EXAM (FREE TEXT)    Answer:   assess response to rx    All questions were answered. The patient knows to call the clinic with any problems, questions or concerns. The total time spent in the appointment was 20 minutes encounter with patients including review of chart and various tests results, discussions about plan of care and coordination  of care plan   [name], MD 08/16/2019 4:39 PM  INTERVAL HISTORY: Please see below for problem oriented charting. 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 Ultrasound irregular hypoechoic mass 7 x 6 x 5 mm   01/06/2012 Initial Biopsy   Initial biopsy was benign . 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 Cervix, biopsy -  HIGH GRADE CARCINOMA. - SEE COMMENT. 2. Endocervix, curettage - HIGH GRADE CARCINOMA. - SEE COMMENT. 3. Endometrium, biopsy - HIGH GRADE CARCINOMA. - SEE COMMENT. Microscopic Comment 1. - 3. The carcinoma in the three specimens is morphologically similar and has features suggestive of papillary serous carcinoma.   07/26/2018 Tumor Marker   Patient's tumor was tested for the following markers: CA-125 Results of  the tumor marker test revealed 835   07/28/2018 Imaging   1. Several (at least 10) solid pulmonary nodules scattered throughout both lungs are new since 2012 chest CT, likely representing pulmonary metastases, largest 6 mm, below PET resolution. 2. Redemonstration of hypodense 2.5 cm segment 7 right liver lobe mass, indeterminate, suspicious for liver metastasis. 3. 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 4. Three-vessel coronary atherosclerosis.  Aortic Atherosclerosis (ICD10-I70.0).   08/03/2018 Cancer Staging   Staging form: Corpus Uteri - Carcinoma and Carcinosarcoma, AJCC 8th Edition - Clinical: FIGO Stage IVB (cT3b, cN2, cM1) - Signed by [name], MD on 08/03/2018   08/05/2018 Tumor Marker   Patient's tumor was tested for the following markers: CA-125 Results of the tumor marker test revealed 901   08/06/2018 Procedure   Placement of single lumen port a cath via right internal jugular vein. The catheter tip lies at the cavo-atrial junction. A power injectable port a cath was placed and is ready for immediate use.    08/11/2018 - 01/12/2019 Chemotherapy   The patient had carboplatin and taxol x 8 cycles   09/01/2018 Tumor Marker   Patient's tumor was tested for the following markers: CA-125 Results of the tumor marker test revealed 406   09/09/2018 Genetic Testing   The Common Hereditary Cancers Panel offered by [facility] includes sequencing and/or deletion duplication testing of the following 48 genes: APC, ATM, AXIN2, BARD1, BMPR1A, BRCA1, BRCA2, BRIP1, CDH1, CDKN2A (p14ARF), CDKN2A (p16INK4a), CKD4, CHEK2, CTNNA1, DICER1, EPCAM (Deletion/duplication testing only), GREM1 (promoter region deletion/duplication testing only), KIT, MEN1, MLH1, MSH2, MSH3, MSH6, MUTYH, NBN, NF1, NHTL1, PALB2, PDGFRA, PMS2, POLD1, POLE, PTEN, RAD50, RAD51C, RAD51D, RNF43, SDHB, SDHC, SDHD, SMAD4, SMARCA4. STK11, TP53, TSC1, TSC2, and VHL.  The following genes were evaluated for  sequence changes only: SDHA and HOXB13 c.251G>A variant only.  Results: Negative, no pathogenic variants identified. The report date is 09/09/2018.     09/22/2018 Tumor Marker   Patient's tumor was tested for the following markers: CA-125 Results of the tumor marker test revealed 110   10/12/2018 Tumor Marker   Patient's tumor was tested for the following markers: CA-125 Results of the tumor marker test revealed 55.3   10/12/2018 Imaging   1. Mild decrease in pulmonary metastases, liver metastases, and metastatic abdominal and pelvic lymphadenopathy. 2. Decreased size of complex cystic lesion in left presacral region. 3. No new or progressive metastatic disease identified. 4. Stable small right inguinal hernia containing small portion of urinary bladder.   11/03/2018 Tumor Marker   Patient's tumor was tested for the following markers: CA-125 Results of the tumor marker test revealed 34.3    Genetic Testing   Patient has genetic testing done for HER2. Results revealed patient has the following: HER2 - negative from pathology from 07/26/18.   12/20/2018 Imaging   1. Possible new omental nodule. 2. Improving abdominal and pelvic retroperitoneal adenopathy. 3. Stable pulmonary and hepatic metastatic disease. 4. Left perirectal/presacral fluid collection has decreased in size. 5. Right inguinal hernia contains a small portion of the bladder, as before. 6. Aortic atherosclerosis (ICD10-170.0). Coronary artery calcification  12/21/2018 Tumor Marker   Patient's tumor was tested for the following markers: CA-125 Results of the tumor marker test revealed 15.6   01/15/2019 Imaging   Ct abdomen and pelvis 1. Small hiatal hernia with fluid in the distal esophagus, which may represent reflux or esophagitis. No other acute findings. 2. Right inguinal hernia contains anterolateral aspect of the urinary bladder, unchanged in appearance from prior. Mild edema within the hernia sac is similar. 3.  Unchanged small low-density lesions in the liver. 4. Unchanged retroperitoneal and bilateral iliac lymph nodes. Small ventral abdominal omental nodule tentatively identified and unchanged from prior.   Aortic Atherosclerosis (ICD10-I70.0).   02/02/2019 Tumor Marker   Patient's tumor was tested for the following markers: CA-125 Results of the tumor marker test revealed 7.8   03/22/2019 Surgery   Preoperative diagnosis: History of incarcerated right inguinal hernia   Postop diagnosis: Incarcerated right inguinal hernia with preperitoneal fat without strangulation or obstruction   Procedure: Repair of right inguinal hernia with ultra Pro hernia system mesh   Surgeon: Erroll Luna, MD   03/31/2019 Tumor Marker   Patient's tumor was tested for the following markers: CA-`125 Results of the tumor marker test revealed 24.4.   05/25/2019 Tumor Marker   Patient's tumor was tested for the following markers: CA-125 Results of the tumor marker test revealed 287   05/25/2019 Imaging   New solid bilateral adnexal masses, consistent with metastatic disease.   New peritoneal soft tissue nodules in the anterior right pelvis, suspicious for peritoneal carcinomatosis. No evidence of ascites.   No significant change in shotty sub-cm abdominal retroperitoneal and bilateral iliac lymph nodes.   06/09/2019 -  Chemotherapy   The patient had pembrolizumab (KEYTRUDA) 200 mg in sodium chloride 0.9 % 50 mL chemo infusion, 200 mg, Intravenous, Once, 4 of 6 cycles Administration: 200 mg (06/09/2019), 200 mg (07/26/2019), 200 mg (08/16/2019), 200 mg (06/30/2019)  for chemotherapy treatment.    06/09/2019 Tumor Marker   Patient's tumor was tested for the following markers: CA-125 Results of the tumor marker test revealed 468   07/26/2019 Tumor Marker   Patient's tumor was tested for the following markers: CA-125 Results of the tumor marker test revealed 282     REVIEW OF SYSTEMS:   Constitutional: Denies  fevers, chills or abnormal weight loss Eyes: Denies blurriness of vision Ears, nose, mouth, throat, and face: Denies mucositis or sore throat Respiratory: Denies cough, dyspnea or wheezes Cardiovascular: Denies palpitation, chest discomfort or lower extremity swelling Gastrointestinal:  Denies nausea, heartburn or change in bowel habits Skin: Denies abnormal skin rashes Lymphatics: Denies new lymphadenopathy or easy bruising Neurological:Denies numbness, tingling or new weaknesses Behavioral/Psych: Mood is stable, no new changes  All other systems were reviewed with the patient and are negative.  I have reviewed the past medical history, past surgical history, social history and family history with the patient and they are unchanged from previous note.  ALLERGIES:  has No Known Allergies.  MEDICATIONS:  Current Outpatient Medications  Medication Sig Dispense Refill  . acetaminophen (TYLENOL) 500 MG tablet Take 1 tablet (500 mg total) by mouth every 6 (six) hours as needed. (Patient taking differently: Take 1,000 mg by mouth every 6 (six) hours as needed for moderate pain. ) 30 tablet 3  . Calcium Carbonate (CALTRATE 600 PO) Take 1 tablet by mouth 2 (two) times daily.     . enalapril (VASOTEC) 20 MG tablet TAKE 1 TABLET(20 MG) BY MOUTH TWICE DAILY (Patient taking differently: Take 20 mg  by mouth daily. ) 180 tablet 2  . Fluticasone-Salmeterol (ADVAIR DISKUS) 100-50 MCG/DOSE AEPB USE 1 INHALATION BY MOUTH TWICE DAILY 60 each 2  . furosemide (LASIX) 40 MG tablet TAKE 1 TABLET BY MOUTH DAILY AS NEEDED FOR LEG SWELLING (Patient taking differently: Take 40 mg by mouth daily as needed (leg swelling). ) 90 tablet 2  . hydrochlorothiazide (HYDRODIURIL) 25 MG tablet TAKE 1 TABLET(25 MG) BY MOUTH DAILY (Patient taking differently: Take 25 mg by mouth daily. ) 90 tablet 1  . lenvatinib 10 mg daily dose (LENVIMA) capsule Take 1 capsule (10 mg total) by mouth daily. 30 capsule 11  . lidocaine-prilocaine  (EMLA) cream Apply to affected area once (Patient taking differently: Apply 1 application topically daily as needed. Apply to affected area once) 30 g 3  . Multiple Vitamin (MULTIVITAMIN) tablet Take 1 tablet by mouth daily.    . Multiple Vitamins-Minerals (ICAPS AREDS 2 PO) Take 1 capsule by mouth 2 (two) times daily.    . ondansetron (ZOFRAN) 8 MG tablet Take 1 tablet (8 mg total) by mouth every 8 (eight) hours as needed. Start on the third day after chemotherapy. 30 tablet 1  . oxyCODONE (OXY IR/ROXICODONE) 5 MG immediate release tablet Take 1 tablet (5 mg total) by mouth every 6 (six) hours as needed for severe pain. 40 tablet 0  . polyethylene glycol (MIRALAX / GLYCOLAX) 17 g packet Take 17 g by mouth daily as needed for moderate constipation.    . prochlorperazine (COMPAZINE) 10 MG tablet Take 1 tablet (10 mg total) by mouth every 6 (six) hours as needed (Nausea or vomiting). (Patient not taking: Reported on 02/07/2019) 30 tablet 1  . rivaroxaban (XARELTO) 20 MG TABS tablet Take 1 tablet (20 mg total) by mouth daily with supper. 90 tablet 1  . Vitamin D, Cholecalciferol, 1000 units CAPS Take 1,000 Units by mouth daily.      No current facility-administered medications for this visit.   Facility-Administered Medications Ordered in Other Visits  Medication Dose Route Frequency Provider Last Rate Last Admin  . sodium chloride flush (NS) 0.9 % injection 10 mL  10 mL Intracatheter PRN Alvy Bimler, Maveryck Bahri, MD   10 mL at 08/16/19 1503    PHYSICAL EXAMINATION: ECOG PERFORMANCE STATUS: 1 - Symptomatic but completely ambulatory  Vitals:   08/16/19 1258  BP: 140/66  Pulse: 86  Resp: 18  Temp: 97.9 F (36.6 C)  SpO2: 97%   Filed Weights   08/16/19 1258  Weight: 232 lb 6.4 oz (105.4 kg)    GENERAL:alert, no distress and comfortable SKIN: skin color, texture, turgor are normal, no rashes or significant lesions EYES: normal, Conjunctiva are pink and non-injected, sclera clear OROPHARYNX:no exudate,  no erythema and lips, buccal mucosa, and tongue normal  NECK: supple, thyroid normal size, non-tender, without nodularity LYMPH:  no palpable lymphadenopathy in the cervical, axillary or inguinal LUNGS: clear to auscultation and percussion with normal breathing effort HEART: regular rate & rhythm and no murmurs and no lower extremity edema ABDOMEN:abdomen soft, non-tender and normal bowel sounds Musculoskeletal:no cyanosis of digits and no clubbing  NEURO: alert & oriented x 3 with fluent speech, no focal motor/sensory deficits  LABORATORY DATA:  I have reviewed the data as listed    Component Value Date/Time   NA 139 08/16/2019 1230   NA 142 12/25/2014 1122   K 4.0 08/16/2019 1230   K 4.1 12/25/2014 1122   CL 101 08/16/2019 1230   CL 102 10/04/2012 1159  CO2 28 08/16/2019 1230   CO2 29 12/25/2014 1122   GLUCOSE 97 08/16/2019 1230   GLUCOSE 99 12/25/2014 1122   GLUCOSE 116 (H) 10/04/2012 1159   BUN 15 08/16/2019 1230   BUN 13.7 12/25/2014 1122   CREATININE 0.91 08/16/2019 1230   CREATININE 0.96 (H) 02/27/2016 1530   CREATININE 0.9 12/25/2014 1122   CALCIUM 9.3 08/16/2019 1230   CALCIUM 10.1 12/25/2014 1122   PROT 7.2 08/16/2019 1230   PROT 7.8 12/25/2014 1122   ALBUMIN 3.0 (L) 08/16/2019 1230   ALBUMIN 3.4 (L) 12/25/2014 1122   AST 26 08/16/2019 1230   AST 21 12/25/2014 1122   ALT 12 08/16/2019 1230   ALT 17 12/25/2014 1122   ALKPHOS 68 08/16/2019 1230   ALKPHOS 99 12/25/2014 1122   BILITOT 0.3 08/16/2019 1230   BILITOT 0.41 12/25/2014 1122   GFRNONAA 60 (L) 08/16/2019 1230   GFRNONAA 58 (L) 02/27/2016 1530   GFRAA >60 08/16/2019 1230   GFRAA 66 02/27/2016 1530    No results found for: SPEP, UPEP  Lab Results  Component Value Date   WBC 7.0 08/16/2019   NEUTROABS 4.4 08/16/2019   HGB 11.2 (L) 08/16/2019   HCT 34.4 (L) 08/16/2019   MCV 97.2 08/16/2019   PLT 190 08/16/2019      Chemistry      Component Value Date/Time   NA 139 08/16/2019 1230   NA 142  12/25/2014 1122   K 4.0 08/16/2019 1230   K 4.1 12/25/2014 1122   CL 101 08/16/2019 1230   CL 102 10/04/2012 1159   CO2 28 08/16/2019 1230   CO2 29 12/25/2014 1122   BUN 15 08/16/2019 1230   BUN 13.7 12/25/2014 1122   CREATININE 0.91 08/16/2019 1230   CREATININE 0.96 (H) 02/27/2016 1530   CREATININE 0.9 12/25/2014 1122      Component Value Date/Time   CALCIUM 9.3 08/16/2019 1230   CALCIUM 10.1 12/25/2014 1122   ALKPHOS 68 08/16/2019 1230   ALKPHOS 99 12/25/2014 1122   AST 26 08/16/2019 1230   AST 21 12/25/2014 1122   ALT 12 08/16/2019 1230   ALT 17 12/25/2014 1122   BILITOT 0.3 08/16/2019 1230   BILITOT 0.41 12/25/2014 1122

## 2019-08-16 NOTE — Patient Instructions (Signed)
[facility] Discharge Instructions for Patients Receiving Chemotherapy  Today you received the following chemotherapy agents:  Keytruda.  To help prevent nausea and vomiting after your treatment, we encourage you to take your nausea medication as directed.   If you develop nausea and vomiting that is not controlled by your nausea medication, call the clinic.   BELOW ARE SYMPTOMS THAT SHOULD BE REPORTED IMMEDIATELY:  *FEVER GREATER THAN 100.5 F  *CHILLS WITH OR WITHOUT FEVER  NAUSEA AND VOMITING THAT IS NOT CONTROLLED WITH YOUR NAUSEA MEDICATION  *UNUSUAL SHORTNESS OF BREATH  *UNUSUAL BRUISING OR BLEEDING  TENDERNESS IN MOUTH AND THROAT WITH OR WITHOUT PRESENCE OF ULCERS  *URINARY PROBLEMS  *BOWEL PROBLEMS  UNUSUAL RASH Items with * indicate a potential emergency and should be followed up as soon as possible.  Feel free to call the clinic should you have any questions or concerns. The clinic phone number is [phone].  Please show the CHEMO ALERT CARD at check-in to the Emergency Department and triage nurse.    

## 2019-08-16 NOTE — Assessment & Plan Note (Signed)
She tolerated treatment well so far Her blood pressure control at home is satisfactory We will proceed with treatment today I plan to order CT imaging to be done prior to her next treatment

## 2019-08-16 NOTE — Telephone Encounter (Signed)
Scheduled per 3/16 sch msg. Confirmed with RN that pt will receive a printout from tx area.

## 2019-08-16 NOTE — Assessment & Plan Note (Signed)
Her blood pressure control at home is satisfactory She will continue to check her blood pressure twice a day while on Lenvima

## 2019-08-16 NOTE — Assessment & Plan Note (Signed)
She has chronic baseline anemia due to chronic kidney disease She is mildly symptomatic Recommend observation only for now 

## 2019-08-17 LAB — CA 125: Cancer Antigen (CA) 125: 190 U/mL — ABNORMAL HIGH (ref 0.0–38.1)

## 2019-08-17 LAB — T4: T4, Total: 8.1 ug/dL (ref 4.5–12.0)

## 2019-08-18 ENCOUNTER — Telehealth: Payer: Self-pay

## 2019-08-18 NOTE — Telephone Encounter (Signed)
-----   Message from [name], MD sent at 08/18/2019  8:08 AM EDT ----- Regarding: CT scan on 4/5 I gave her paperwork to schedule CT for 4/5 It's not done Can you call her daughter before scheduling? Have her to do it so she can get the time that works for her  Thanks

## 2019-08-24 ENCOUNTER — Other Ambulatory Visit: Payer: Self-pay

## 2019-08-24 ENCOUNTER — Ambulatory Visit (INDEPENDENT_AMBULATORY_CARE_PROVIDER_SITE_OTHER): Payer: Medicare Other | Admitting: Family Medicine

## 2019-08-24 ENCOUNTER — Encounter: Payer: Self-pay | Admitting: Family Medicine

## 2019-08-24 VITALS — BP 124/64 | HR 99 | Ht 67.0 in | Wt 231.0 lb

## 2019-08-24 DIAGNOSIS — Q159 Congenital malformation of eye, unspecified: Secondary | ICD-10-CM | POA: Diagnosis not present

## 2019-08-24 DIAGNOSIS — I10 Essential (primary) hypertension: Secondary | ICD-10-CM | POA: Diagnosis not present

## 2019-08-24 DIAGNOSIS — R946 Abnormal results of thyroid function studies: Secondary | ICD-10-CM

## 2019-08-24 DIAGNOSIS — R7303 Prediabetes: Secondary | ICD-10-CM | POA: Diagnosis not present

## 2019-08-24 LAB — POCT GLYCOSYLATED HEMOGLOBIN (HGB A1C): Hemoglobin A1C: 5.3 % (ref 4.0–5.6)

## 2019-08-24 NOTE — Progress Notes (Signed)
    SUBJECTIVE:   CHIEF COMPLAINT / HPI: "Follow-up with PCP per Dr. Katy Fitch"  Erin Esparza is an 79 year old female presenting per the recommendations of her ophthalmologist.  She reports that their office was going to call us or fax information, however this has not been received.  She is unclear on what needed follow-up, however reports he told her there was cholesterol within her eyes and may be need to look at her thyroid/neck.  She reports no vision loss, chest pain, weakness/numbness, slurred speech.  No lipid panel since 2018.  She is followed closely by her oncologist, recently had abnormal thyroid studies (hyperthyroid), however most recent T4 8 days ago was within normal limits.  Denies any hyperactivity, anxiety/restlessness, diarrhea, sensitivity to heat.  PERTINENT  PMH / PSH: Stage IVb uterine cancer currently being followed by oncology, peripheral neuropathy, CKD, history of left breast cancer, prediabetes, anemia, hypertension  OBJECTIVE:   BP 124/64   Pulse 99   Ht 5\' 7"  (1.702 m)   Wt 231 lb (104.8 kg)   SpO2 99%   BMI 36.18 kg/m   General: Alert, NAD HEENT: NCAT, MMM, supple, thyroid nonenlarged and nontender without palpated nodules Cardiac: RRR no m/g/r Lungs: Clear bilaterally, no increased WOB  Abdomen: soft, non-tender Msk: Moves all extremities spontaneously  Ext: Warm, dry, 2+ distal pulses  ASSESSMENT/PLAN:   Eye abnormalities Suspect her ophthalmologist may have been referring to retinal drusen vs cholesterol emboli but unclear, however will request records from his office to have a better understanding of concern.  In the meantime, will touch base with her oncologist's office to add a lipid panel to her next labs on 4/6 so that these can be drawn from her port.  Less apt to obtain an carotid ultrasound to assess for stenosis as she would not be a surgical candidate and is not interested in pursuing this, will medically manage for the time being.   Abnormal  thyroid function test Mild hyperthyroidism one month ago, now TSH/T4 subclinical.  Asymptomatic without abnormalities on thyroid exam.  May have been brief thyroiditis related to chemotherapy treatments.  Will monitor for now, can consider imaging or additional labs if returns and is persistent.   Prediabetes Recheck A1c today, wnl at 5.3.  Will monitor periodically.  Essential hypertension Well-controlled currently. Pt will continue to monitor at home as well.    Follow-up pending labs/Dr. Katy Fitch records.  Discussed office visit and plan of action with daughter on the phone while patient present.  Will request nursing concern and management issues oncologist office  [name], [place]

## 2019-08-24 NOTE — Patient Instructions (Addendum)
It was so wonderful to see you today.  We will get the records from Dr. Katy Fitch to see if there are additional images or labs that we need to obtain.  I will touch base with Dr. Alvy Bimler to add on a lipid panel see your labs next week.  Our follow-up will depend on the records we obtain and your thyroid/cholesterol labs next week.  Please let me know if you need anything in the meantime.  Your blood pressure is looking good today.

## 2019-08-25 ENCOUNTER — Encounter: Payer: Self-pay | Admitting: Family Medicine

## 2019-08-25 DIAGNOSIS — Q159 Congenital malformation of eye, unspecified: Secondary | ICD-10-CM | POA: Insufficient documentation

## 2019-08-25 DIAGNOSIS — R946 Abnormal results of thyroid function studies: Secondary | ICD-10-CM | POA: Insufficient documentation

## 2019-08-25 NOTE — Assessment & Plan Note (Addendum)
Suspect her ophthalmologist may have been referring to retinal drusen vs cholesterol emboli but unclear, however will request records from his office to have a better understanding of concern.  In the meantime, will touch base with her oncologist's office to add a lipid panel to her next labs on 4/6 so that these can be drawn from her port.  Less apt to obtain an carotid ultrasound to assess for stenosis as she would not be a surgical candidate and is not interested in pursuing this, will medically manage for the time being.

## 2019-08-25 NOTE — Assessment & Plan Note (Signed)
Recheck A1c today, wnl at 5.3.  Will monitor periodically.

## 2019-08-25 NOTE — Assessment & Plan Note (Signed)
Well-controlled currently. Pt will continue to monitor at home as well.

## 2019-08-25 NOTE — Assessment & Plan Note (Signed)
Mild hyperthyroidism one month ago, now TSH/T4 subclinical.  Asymptomatic without abnormalities on thyroid exam.  May have been brief thyroiditis related to chemotherapy treatments.  Will monitor for now, can consider imaging or additional labs if returns and is persistent.

## 2019-08-26 ENCOUNTER — Other Ambulatory Visit: Payer: Self-pay | Admitting: Hematology and Oncology

## 2019-08-26 DIAGNOSIS — E785 Hyperlipidemia, unspecified: Secondary | ICD-10-CM | POA: Insufficient documentation

## 2019-08-31 ENCOUNTER — Other Ambulatory Visit: Payer: Self-pay | Admitting: *Deleted

## 2019-09-01 MED FILL — LENVIMA 10 MG DAILY DOSE: 10 | 30 days supply | Qty: 30 | Fill #3

## 2019-09-02 MED ORDER — HYDROCHLOROTHIAZIDE 25 MG PO TABS: 25.0000 mg | ORAL_TABLET | Freq: Every day | ORAL | 2 refills | Status: DC

## 2019-09-02 NOTE — Progress Notes (Signed)

## 2019-09-05 ENCOUNTER — Other Ambulatory Visit: Payer: Self-pay

## 2019-09-05 ENCOUNTER — Ambulatory Visit (HOSPITAL_COMMUNITY)
Admission: RE | Admit: 2019-09-05 | Discharge: 2019-09-05 | Disposition: A | Discharge status: Home / Self Care | Payer: Medicare Other | Source: Ambulatory Visit | Attending: Hematology and Oncology | Admitting: Hematology and Oncology

## 2019-09-05 ENCOUNTER — Encounter (HOSPITAL_COMMUNITY): Payer: Self-pay

## 2019-09-05 DIAGNOSIS — C55 Malignant neoplasm of uterus, part unspecified: Secondary | ICD-10-CM

## 2019-09-05 MED ORDER — HEPARIN SOD (PORK) LOCK FLUSH 100 UNIT/ML IV SOLN: 500.0000 [IU] | Freq: Once | INTRAVENOUS | Status: AC

## 2019-09-05 MED ORDER — SODIUM CHLORIDE (PF) 0.9 % IJ SOLN
INTRAMUSCULAR | Status: AC
  Filled 2019-09-05: qty 50

## 2019-09-05 MED ORDER — IOHEXOL 300 MG/ML  SOLN
100.0000 mL | Freq: Once | INTRAMUSCULAR | Status: AC | PRN
  Administered 2019-09-05: 100 mL via INTRAVENOUS

## 2019-09-05 MED ORDER — HEPARIN SOD (PORK) LOCK FLUSH 100 UNIT/ML IV SOLN
INTRAVENOUS | Status: AC
  Administered 2019-09-05: 500 [IU] via INTRAVENOUS
  Filled 2019-09-05: qty 5

## 2019-09-06 ENCOUNTER — Other Ambulatory Visit: Payer: Self-pay

## 2019-09-06 ENCOUNTER — Inpatient Hospital Stay: Payer: Medicare Other | Attending: Gynecologic Oncology

## 2019-09-06 ENCOUNTER — Telehealth: Payer: Self-pay

## 2019-09-06 ENCOUNTER — Inpatient Hospital Stay: Payer: Medicare Other

## 2019-09-06 ENCOUNTER — Encounter: Payer: Self-pay | Admitting: Hematology and Oncology

## 2019-09-06 ENCOUNTER — Inpatient Hospital Stay (HOSPITAL_BASED_OUTPATIENT_CLINIC_OR_DEPARTMENT_OTHER): Payer: Medicare Other | Admitting: Hematology and Oncology

## 2019-09-06 ENCOUNTER — Other Ambulatory Visit: Payer: Self-pay | Admitting: Hematology and Oncology

## 2019-09-06 DIAGNOSIS — Z79899 Other long term (current) drug therapy: Secondary | ICD-10-CM | POA: Insufficient documentation

## 2019-09-06 DIAGNOSIS — N183 Chronic kidney disease, stage 3 unspecified: Secondary | ICD-10-CM | POA: Insufficient documentation

## 2019-09-06 DIAGNOSIS — I129 Hypertensive chronic kidney disease with stage 1 through stage 4 chronic kidney disease, or unspecified chronic kidney disease: Secondary | ICD-10-CM | POA: Diagnosis not present

## 2019-09-06 DIAGNOSIS — R634 Abnormal weight loss: Secondary | ICD-10-CM

## 2019-09-06 DIAGNOSIS — C55 Malignant neoplasm of uterus, part unspecified: Secondary | ICD-10-CM

## 2019-09-06 DIAGNOSIS — Z7189 Other specified counseling: Secondary | ICD-10-CM

## 2019-09-06 DIAGNOSIS — E785 Hyperlipidemia, unspecified: Secondary | ICD-10-CM

## 2019-09-06 DIAGNOSIS — I10 Essential (primary) hypertension: Secondary | ICD-10-CM

## 2019-09-06 DIAGNOSIS — Z5112 Encounter for antineoplastic immunotherapy: Secondary | ICD-10-CM | POA: Insufficient documentation

## 2019-09-06 DIAGNOSIS — C541 Malignant neoplasm of endometrium: Secondary | ICD-10-CM | POA: Insufficient documentation

## 2019-09-06 DIAGNOSIS — R946 Abnormal results of thyroid function studies: Secondary | ICD-10-CM

## 2019-09-06 LAB — CMP (CANCER CENTER ONLY)
ALT: 11 U/L (ref 0–44)
AST: 29 U/L (ref 15–41)
Albumin: 3.4 g/dL — ABNORMAL LOW (ref 3.5–5.0)
Alkaline Phosphatase: 64 U/L (ref 38–126)
Anion gap: 11 (ref 5–15)
BUN: 14 mg/dL (ref 8–23)
CO2: 27 mmol/L (ref 22–32)
Calcium: 9.8 mg/dL (ref 8.9–10.3)
Chloride: 101 mmol/L (ref 98–111)
Creatinine: 1.08 mg/dL — ABNORMAL HIGH (ref 0.44–1.00)
GFR, Est AFR Am: 56 mL/min — ABNORMAL LOW (ref >60)
GFR, Estimated: 48 mL/min — ABNORMAL LOW (ref >60)
Glucose, Bld: 81 mg/dL (ref 70–99)
Potassium: 3.8 mmol/L (ref 3.5–5.1)
Sodium: 139 mmol/L (ref 135–145)
Total Bilirubin: 0.5 mg/dL (ref 0.3–1.2)
Total Protein: 7.5 g/dL (ref 6.5–8.1)

## 2019-09-06 LAB — CBC WITH DIFFERENTIAL (CANCER CENTER ONLY)
Abs Immature Granulocytes: 0 10*3/uL (ref 0.00–0.07)
Basophils Absolute: 0 10*3/uL (ref 0.0–0.1)
Basophils Relative: 1 %
Eosinophils Absolute: 0.3 10*3/uL (ref 0.0–0.5)
Eosinophils Relative: 7 %
HCT: 36.8 % (ref 36.0–46.0)
Hemoglobin: 11.9 g/dL — ABNORMAL LOW (ref 12.0–15.0)
Immature Granulocytes: 0 %
Lymphocytes Relative: 23 %
Lymphs Abs: 1.1 10*3/uL (ref 0.7–4.0)
MCH: 31.3 pg (ref 26.0–34.0)
MCHC: 32.3 g/dL (ref 30.0–36.0)
MCV: 96.8 fL (ref 80.0–100.0)
Monocytes Absolute: 0.7 10*3/uL (ref 0.1–1.0)
Monocytes Relative: 14 %
Neutro Abs: 2.8 10*3/uL (ref 1.7–7.7)
Neutrophils Relative %: 55 %
Platelet Count: 183 10*3/uL (ref 150–400)
RBC: 3.8 MIL/uL — ABNORMAL LOW (ref 3.87–5.11)
RDW: 14.7 % (ref 11.5–15.5)
WBC Count: 5 10*3/uL (ref 4.0–10.5)
nRBC: 0 % (ref 0.0–0.2)

## 2019-09-06 LAB — LIPID PANEL
Cholesterol: 204 mg/dL — ABNORMAL HIGH (ref 0–200)
HDL: 47 mg/dL (ref >40)
LDL Cholesterol: 137 mg/dL — ABNORMAL HIGH (ref 0–99)
Total CHOL/HDL Ratio: 4.3 RATIO
Triglycerides: 100 mg/dL (ref <150)
VLDL: 20 mg/dL (ref 0–40)

## 2019-09-06 LAB — TSH: TSH: 49.834 u[IU]/mL — ABNORMAL HIGH (ref 0.308–3.960)

## 2019-09-06 LAB — TOTAL PROTEIN, URINE DIPSTICK: Protein, ur: <30 mg/dL — AB

## 2019-09-06 MED ORDER — HEPARIN SOD (PORK) LOCK FLUSH 100 UNIT/ML IV SOLN
500.0000 [IU] | Freq: Once | INTRAVENOUS | Status: AC | PRN
  Administered 2019-09-06: 500 [IU]
  Filled 2019-09-06: qty 5

## 2019-09-06 MED ORDER — LEVOTHYROXINE SODIUM 50 MCG PO TABS: 50.0000 ug | ORAL_TABLET | Freq: Every day | ORAL | 11 refills | Status: DC

## 2019-09-06 MED ORDER — SODIUM CHLORIDE 0.9 % IV SOLN
Freq: Once | INTRAVENOUS | Status: AC
  Filled 2019-09-06: qty 250

## 2019-09-06 MED ORDER — SODIUM CHLORIDE 0.9% FLUSH
10.0000 mL | Freq: Once | INTRAVENOUS | Status: AC
  Administered 2019-09-06: 10 mL
  Filled 2019-09-06: qty 10

## 2019-09-06 MED ORDER — SODIUM CHLORIDE 0.9% FLUSH
10.0000 mL | INTRAVENOUS | Status: DC | PRN
  Administered 2019-09-06: 10 mL
  Filled 2019-09-06: qty 10

## 2019-09-06 MED ORDER — SODIUM CHLORIDE 0.9 % IV SOLN
200.0000 mg | Freq: Once | INTRAVENOUS | Status: AC
  Administered 2019-09-06: 200 mg via INTRAVENOUS
  Filled 2019-09-06: qty 8

## 2019-09-06 MED ORDER — OXYCODONE HCL 5 MG PO TABS: 5.0000 mg | ORAL_TABLET | Freq: Four times a day (QID) | ORAL | 0 refills | Status: DC | PRN

## 2019-09-06 NOTE — Patient Instructions (Signed)
[facility] Discharge Instructions for Patients Receiving Chemotherapy  Today you received the following chemotherapy agents:  Keytruda.  To help prevent nausea and vomiting after your treatment, we encourage you to take your nausea medication as directed.   If you develop nausea and vomiting that is not controlled by your nausea medication, call the clinic.   BELOW ARE SYMPTOMS THAT SHOULD BE REPORTED IMMEDIATELY:  *FEVER GREATER THAN 100.5 F  *CHILLS WITH OR WITHOUT FEVER  NAUSEA AND VOMITING THAT IS NOT CONTROLLED WITH YOUR NAUSEA MEDICATION  *UNUSUAL SHORTNESS OF BREATH  *UNUSUAL BRUISING OR BLEEDING  TENDERNESS IN MOUTH AND THROAT WITH OR WITHOUT PRESENCE OF ULCERS  *URINARY PROBLEMS  *BOWEL PROBLEMS  UNUSUAL RASH Items with * indicate a potential emergency and should be followed up as soon as possible.  Feel free to call the clinic should you have any questions or concerns. The clinic phone number is [phone].  Please show the CHEMO ALERT CARD at check-in to the Emergency Department and triage nurse.    

## 2019-09-06 NOTE — Progress Notes (Signed)
[facility] OFFICE PROGRESS NOTE  Patient Care Team: [name], DO as PCP - General (Family Medicine) [name], MD as Consulting Physician (Ophthalmology) [name], DPM as Consulting Physician (Podiatry) [name], [name], MD as Referring Physician (Optometry) [name], MD (Inactive) as Consulting Physician (Orthopedic Surgery) [name], RN as Oncology Nurse Navigator (Oncology)  ASSESSMENT & PLAN:  Uterine cancer [facility]) She tolerated treatment well so far Her blood pressure control at home is satisfactory We will proceed with treatment today I have reviewed CT imaging with the family extensively The borderline enlarged lymph node, in my opinion, is stable The uterine mass is improved She tolerated treatment fairly well I plan to repeat another CT imaging again in 3 months, due around July  CKD (chronic kidney disease), stage III ([place]) Her serum creatinine is satisfactory We discussed the importance of adequate hydration and risk factor modification  Weight loss She had recent weight loss due to reduced appetite We discussed dietary modification If her blood pressure drops further or she lose more weight, we might have to discontinue one of her diuretics She will try her best to add more dietary supplement  Essential hypertension Her blood pressure control is excellent I am concerned about progressive weight loss I have extensive discussion with the family They will call me and discontinue one of the diuretics if she lose more weight  Goals of care, counseling/discussion We have discussions about goals of care She has stable disease control on recent CT imaging We will continue treatment indefinitely   No orders of the defined types were placed in this encounter.   All questions were answered. The patient knows to call the clinic with any problems, questions or concerns. The total time spent in the appointment was 30  minutes encounter with patients including review of chart and various tests results, discussions about plan of care and coordination of care plan   [name], MD 09/06/2019 1:32 PM  INTERVAL HISTORY: Please see below for problem oriented charting. 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 Ultrasound irregular hypoechoic mass 7 x 6 x 5 mm   01/06/2012 Initial Biopsy   Initial biopsy was benign . 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 Cervix, biopsy - HIGH GRADE CARCINOMA. - SEE COMMENT. 2. Endocervix, curettage - HIGH GRADE CARCINOMA. - SEE COMMENT. 3. Endometrium, biopsy -  HIGH GRADE CARCINOMA. - SEE COMMENT. Microscopic Comment 1. - 3. The carcinoma in the three specimens is morphologically similar and has features suggestive of papillary serous carcinoma.   07/26/2018 Tumor Marker   Patient's tumor was tested for the following markers: CA-125 Results of the tumor marker test revealed 835   07/28/2018 Imaging   1. Several (at least 10) solid pulmonary nodules scattered throughout both lungs are new since 2012 chest CT, likely representing pulmonary metastases, largest 6 mm, below PET resolution. 2. Redemonstration of hypodense 2.5 cm segment 7 right liver lobe mass, indeterminate, suspicious for liver metastasis. 3. 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 4. Three-vessel coronary atherosclerosis.  Aortic Atherosclerosis (ICD10-I70.0).   08/03/2018 Cancer Staging   Staging form: Corpus Uteri - Carcinoma and Carcinosarcoma, AJCC 8th Edition - Clinical: FIGO Stage IVB (cT3b, cN2, cM1) - Signed by [name], MD on 08/03/2018   08/05/2018 Tumor Marker   Patient's tumor was tested for the following markers: CA-125 Results of the tumor marker test revealed 901   08/06/2018 Procedure   Placement of single lumen port a cath via right internal jugular vein. The catheter tip lies at the cavo-atrial junction. A power injectable port a cath was placed and is ready for immediate use.    08/11/2018 - 01/12/2019 Chemotherapy   The patient had carboplatin and taxol x 8 cycles   09/01/2018 Tumor Marker   Patient's tumor was tested for the following markers: CA-125 Results of the tumor marker test revealed 406   09/09/2018 Genetic Testing   The Common Hereditary Cancers Panel offered by [facility] includes sequencing and/or deletion duplication testing of the following 48 genes: APC, ATM, AXIN2, BARD1, BMPR1A, BRCA1, BRCA2, BRIP1, CDH1, CDKN2A (p14ARF), CDKN2A (p16INK4a), CKD4, CHEK2, CTNNA1, DICER1, EPCAM (Deletion/duplication testing only), GREM1  (promoter region deletion/duplication testing only), KIT, MEN1, MLH1, MSH2, MSH3, MSH6, MUTYH, NBN, NF1, NHTL1, PALB2, PDGFRA, PMS2, POLD1, POLE, PTEN, RAD50, RAD51C, RAD51D, RNF43, SDHB, SDHC, SDHD, SMAD4, SMARCA4. STK11, TP53, TSC1, TSC2, and VHL.  The following genes were evaluated for sequence changes only: SDHA and HOXB13 c.251G>A variant only.  Results: Negative, no pathogenic variants identified. The report date is 09/09/2018.     09/22/2018 Tumor Marker   Patient's tumor was tested for the following markers: CA-125 Results of the tumor marker test revealed 110   10/12/2018 Tumor Marker   Patient's tumor was tested for the following markers: CA-125 Results of the tumor marker test revealed 55.3   10/12/2018 Imaging   1. Mild decrease in pulmonary metastases, liver metastases, and metastatic abdominal and pelvic lymphadenopathy. 2. Decreased size of complex cystic lesion in left presacral region. 3. No new or progressive metastatic disease identified. 4. Stable small right inguinal hernia containing small portion of urinary bladder.   11/03/2018 Tumor Marker   Patient's tumor was tested for the following markers: CA-125 Results of the tumor marker test revealed 34.3    Genetic Testing   Patient has genetic testing done for HER2. Results revealed patient has the following: HER2 - negative from pathology from 07/26/18.   12/20/2018 Imaging   1. Possible new omental nodule. 2. Improving abdominal and pelvic retroperitoneal adenopathy. 3. Stable pulmonary and hepatic metastatic disease. 4. Left perirectal/presacral fluid collection has decreased  in size. 5. Right inguinal hernia contains a small portion of the bladder, as before. 6. Aortic atherosclerosis (ICD10-170.0). Coronary artery calcification   12/21/2018 Tumor Marker   Patient's tumor was tested for the following markers: CA-125 Results of the tumor marker test revealed 15.6   01/15/2019 Imaging   Ct abdomen and pelvis 1.  Small hiatal hernia with fluid in the distal esophagus, which may represent reflux or esophagitis. No other acute findings. 2. Right inguinal hernia contains anterolateral aspect of the urinary bladder, unchanged in appearance from prior. Mild edema within the hernia sac is similar. 3. Unchanged small low-density lesions in the liver. 4. Unchanged retroperitoneal and bilateral iliac lymph nodes. Small ventral abdominal omental nodule tentatively identified and unchanged from prior.   Aortic Atherosclerosis (ICD10-I70.0).   02/02/2019 Tumor Marker   Patient's tumor was tested for the following markers: CA-125 Results of the tumor marker test revealed 7.8   03/22/2019 Surgery   Preoperative diagnosis: History of incarcerated right inguinal hernia   Postop diagnosis: Incarcerated right inguinal hernia with preperitoneal fat without strangulation or obstruction   Procedure: Repair of right inguinal hernia with ultra Pro hernia system mesh   Surgeon: Erroll Luna, MD   03/31/2019 Tumor Marker   Patient's tumor was tested for the following markers: CA-`125 Results of the tumor marker test revealed 24.4.   05/25/2019 Tumor Marker   Patient's tumor was tested for the following markers: CA-125 Results of the tumor marker test revealed 287   05/25/2019 Imaging   New solid bilateral adnexal masses, consistent with metastatic disease.   New peritoneal soft tissue nodules in the anterior right pelvis, suspicious for peritoneal carcinomatosis. No evidence of ascites.   No significant change in shotty sub-cm abdominal retroperitoneal and bilateral iliac lymph nodes.   06/09/2019 -  Chemotherapy   The patient had pembrolizumab (KEYTRUDA) 200 mg in sodium chloride 0.9 % 50 mL chemo infusion, 200 mg, Intravenous, Once, 5 of 6 cycles Administration: 200 mg (06/09/2019), 200 mg (07/26/2019), 200 mg (08/16/2019), 200 mg (06/30/2019)  for chemotherapy treatment.    06/09/2019 Tumor Marker   Patient's tumor  was tested for the following markers: CA-125 Results of the tumor marker test revealed 468   07/26/2019 Tumor Marker   Patient's tumor was tested for the following markers: CA-125 Results of the tumor marker test revealed 282   08/17/2019 Tumor Marker   Patient's tumor was tested for the following markers: CA-125 Results of the tumor marker test revealed 190.   09/05/2019 Imaging   1. Subtle nodularity along the transverse mesocolon measuring 11 mm is concerning for peritoneal carcinomatosis. Also associated with perihepatic ascites and enlarging juxta diaphragmatic nodal disease.  Attention on follow-up. 2. Bilateral adnexal masses are similar in size to the previous exam. 3. Left para-aortic lymph node is slightly increased in size from the previous exam, slight interval enlargement is suspicious based on location for nodal involvement. Scattered subcentimeter lymph nodes throughout the left and right pelvis are similar to the previous exam. 4. Calcified coronary artery disease. 5. Low-density lesions in the liver are unchanged. 6. Persistent endometrial thickening not well assessed on CT. 7. Stranding in the right inguinal region tracking towards the abdominal wall likely related to prior inguinal hernia repair is unchanged. Area of discrete nodularity in this location is no longer seen.   Aortic Atherosclerosis (ICD10-I70.0).         REVIEW OF SYSTEMS:   Constitutional: Denies fevers, chills  Eyes: Denies blurriness of vision Ears, nose,  mouth, throat, and face: Denies mucositis or sore throat Respiratory: Denies cough, dyspnea or wheezes Cardiovascular: Denies palpitation, chest discomfort  Gastrointestinal:  Denies nausea, heartburn or change in bowel habits Skin: Denies abnormal skin rashes Lymphatics: Denies new lymphadenopathy or easy bruising Neurological:Denies numbness, tingling or new weaknesses Behavioral/Psych: Mood is stable, no new changes  All other systems were  reviewed with the patient and are negative.  I have reviewed the past medical history, past surgical history, social history and family history with the patient and they are unchanged from previous note.  ALLERGIES:  has No Known Allergies.  MEDICATIONS:  Current Outpatient Medications  Medication Sig Dispense Refill  . acetaminophen (TYLENOL) 500 MG tablet Take 1 tablet (500 mg total) by mouth every 6 (six) hours as needed. (Patient taking differently: Take 1,000 mg by mouth every 6 (six) hours as needed for moderate pain. ) 30 tablet 3  . Calcium Carbonate (CALTRATE 600 PO) Take 1 tablet by mouth 2 (two) times daily.     . enalapril (VASOTEC) 20 MG tablet TAKE 1 TABLET(20 MG) BY MOUTH TWICE DAILY (Patient taking differently: Take 20 mg by mouth daily. ) 180 tablet 2  . Fluticasone-Salmeterol (ADVAIR DISKUS) 100-50 MCG/DOSE AEPB USE 1 INHALATION BY MOUTH TWICE DAILY 60 each 2  . furosemide (LASIX) 40 MG tablet TAKE 1 TABLET BY MOUTH DAILY AS NEEDED FOR LEG SWELLING (Patient taking differently: Take 40 mg by mouth daily as needed (leg swelling). ) 90 tablet 2  . hydrochlorothiazide (HYDRODIURIL) 25 MG tablet Take 1 tablet (25 mg total) by mouth daily. 90 tablet 2  . lenvatinib 10 mg daily dose (LENVIMA) capsule Take 1 capsule (10 mg total) by mouth daily. 30 capsule 11  . lidocaine-prilocaine (EMLA) cream Apply to affected area once (Patient taking differently: Apply 1 application topically daily as needed. Apply to affected area once) 30 g 3  . Multiple Vitamin (MULTIVITAMIN) tablet Take 1 tablet by mouth daily.    . Multiple Vitamins-Minerals (ICAPS AREDS 2 PO) Take 1 capsule by mouth 2 (two) times daily.    . ondansetron (ZOFRAN) 8 MG tablet Take 1 tablet (8 mg total) by mouth every 8 (eight) hours as needed. Start on the third day after chemotherapy. 30 tablet 1  . oxyCODONE (OXY IR/ROXICODONE) 5 MG immediate release tablet Take 1 tablet (5 mg total) by mouth every 6 (six) hours as needed for  severe pain. 60 tablet 0  . polyethylene glycol (MIRALAX / GLYCOLAX) 17 g packet Take 17 g by mouth daily as needed for moderate constipation.    . prochlorperazine (COMPAZINE) 10 MG tablet Take 1 tablet (10 mg total) by mouth every 6 (six) hours as needed (Nausea or vomiting). (Patient not taking: Reported on 02/07/2019) 30 tablet 1  . rivaroxaban (XARELTO) 20 MG TABS tablet Take 1 tablet (20 mg total) by mouth daily with supper. 90 tablet 1  . Vitamin D, Cholecalciferol, 1000 units CAPS Take 1,000 Units by mouth daily.      No current facility-administered medications for this visit.   Facility-Administered Medications Ordered in Other Visits  Medication Dose Route Frequency Provider Last Rate Last Admin  . heparin lock flush 100 unit/mL  500 Units Intracatheter Once PRN Alvy Bimler, Aysen Shieh, MD      . pembrolizumab (KEYTRUDA) 200 mg in sodium chloride 0.9 % 50 mL chemo infusion  200 mg Intravenous Once Kenneth Lax, MD      . sodium chloride flush (NS) 0.9 % injection 10 mL  10  mL Intracatheter PRN [name], MD        PHYSICAL EXAMINATION: ECOG PERFORMANCE STATUS: 2 - Symptomatic, <50% confined to bed  Vitals:   09/06/19 1220  BP: 135/60  Pulse: 90  Resp: 18  Temp: 98.5 F (36.9 C)  SpO2: 100%   Filed Weights   09/06/19 1220  Weight: 225 lb (102.1 kg)    GENERAL:alert, no distress and comfortable SKIN: skin color, texture, turgor are normal, no rashes or significant lesions EYES: normal, Conjunctiva are pink and non-injected, sclera clear OROPHARYNX:no exudate, no erythema and lips, buccal mucosa, and tongue normal  NECK: supple, thyroid normal size, non-tender, without nodularity LYMPH:  no palpable lymphadenopathy in the cervical, axillary or inguinal LUNGS: clear to auscultation and percussion with normal breathing effort HEART: regular rate & rhythm and no murmurs with moderate bilateral lower extremity edema ABDOMEN:abdomen soft, non-tender and normal bowel  sounds Musculoskeletal:no cyanosis of digits and no clubbing  NEURO: alert & oriented x 3 with fluent speech, no focal motor/sensory deficits  LABORATORY DATA:  I have reviewed the data as listed    Component Value Date/Time   NA 139 09/06/2019 1205   NA 142 12/25/2014 1122   K 3.8 09/06/2019 1205   K 4.1 12/25/2014 1122   CL 101 09/06/2019 1205   CL 102 10/04/2012 1159   CO2 27 09/06/2019 1205   CO2 29 12/25/2014 1122   GLUCOSE 81 09/06/2019 1205   GLUCOSE 99 12/25/2014 1122   GLUCOSE 116 (H) 10/04/2012 1159   BUN 14 09/06/2019 1205   BUN 13.7 12/25/2014 1122   CREATININE 1.08 (H) 09/06/2019 1205   CREATININE 0.96 (H) 02/27/2016 1530   CREATININE 0.9 12/25/2014 1122   CALCIUM 9.8 09/06/2019 1205   CALCIUM 10.1 12/25/2014 1122   PROT 7.5 09/06/2019 1205   PROT 7.8 12/25/2014 1122   ALBUMIN 3.4 (L) 09/06/2019 1205   ALBUMIN 3.4 (L) 12/25/2014 1122   AST 29 09/06/2019 1205   AST 21 12/25/2014 1122   ALT 11 09/06/2019 1205   ALT 17 12/25/2014 1122   ALKPHOS 64 09/06/2019 1205   ALKPHOS 99 12/25/2014 1122   BILITOT 0.5 09/06/2019 1205   BILITOT 0.41 12/25/2014 1122   GFRNONAA 48 (L) 09/06/2019 1205   GFRNONAA 58 (L) 02/27/2016 1530   GFRAA 56 (L) 09/06/2019 1205   GFRAA 66 02/27/2016 1530    No results found for: SPEP, UPEP  Lab Results  Component Value Date   WBC 5.0 09/06/2019   NEUTROABS 2.8 09/06/2019   HGB 11.9 (L) 09/06/2019   HCT 36.8 09/06/2019   MCV 96.8 09/06/2019   PLT 183 09/06/2019      Chemistry      Component Value Date/Time   NA 139 09/06/2019 1205   NA 142 12/25/2014 1122   K 3.8 09/06/2019 1205   K 4.1 12/25/2014 1122   CL 101 09/06/2019 1205   CL 102 10/04/2012 1159   CO2 27 09/06/2019 1205   CO2 29 12/25/2014 1122   BUN 14 09/06/2019 1205   BUN 13.7 12/25/2014 1122   CREATININE 1.08 (H) 09/06/2019 1205   CREATININE 0.96 (H) 02/27/2016 1530   CREATININE 0.9 12/25/2014 1122      Component Value Date/Time   CALCIUM 9.8 09/06/2019  1205   CALCIUM 10.1 12/25/2014 1122   ALKPHOS 64 09/06/2019 1205   ALKPHOS 99 12/25/2014 1122   AST 29 09/06/2019 1205   AST 21 12/25/2014 1122   ALT 11 09/06/2019 1205   ALT  17 12/25/2014 1122   BILITOT 0.5 09/06/2019 1205   BILITOT 0.41 12/25/2014 1122       RADIOGRAPHIC STUDIES: I have reviewed multiple imaging studies with the patient and family I have personally reviewed the radiological images as listed and agreed with the findings in the report. CT ABDOMEN PELVIS W CONTRAST  Result Date: 09/05/2019 CLINICAL DATA:  Uterine cancer ongoing chemotherapy, assess treatment response EXAM: CT ABDOMEN AND PELVIS WITH CONTRAST TECHNIQUE: Multidetector CT imaging of the abdomen and pelvis was performed using the standard protocol following bolus administration of intravenous contrast. CONTRAST:  19m OMNIPAQUE IOHEXOL 300 MG/ML  SOLN COMPARISON:  05/25/2019 FINDINGS: Lower chest: Calcified coronary artery disease. Heart size stable accounting for incomplete imaging. No pericardial effusion. No consolidation or pleural fluid. Hepatobiliary: Low-density lesions in the liver are unchanged. Portal vein is patent. Sludge likely layering in the gallbladder. No pericholecystic stranding. No biliary ductal dilation. Small fluid along the right hemi liver. Juxta diaphragmatic lymph node just above the right hemidiaphragm 8 mm new from previous exam. Pancreas: Pancreas is normal without sign of ductal dilation, lesion or peripancreatic inflammation. Spleen: Spleen is normal size. No focal lesion. Adrenals/Urinary Tract: Adrenal glands are normal. Cyst in the left kidney. In the upper pole unchanged. No evidence of hydronephrosis or nephrolithiasis. Stomach/Bowel: Small bowel is normal caliber. Stool fills much of the colon. Abundant stool and gas in the rectum. Appendix is normal. Vascular/Lymphatic: Calcified atheromatous plaque in the abdominal aorta. No aneurysmal dilation. Left para-aortic lymph node (image  33, series 2) 11 mm short axis, previously 9 mm. No upper abdominal adenopathy. Left external iliac lymph node (image 65, series 2) 15 mm short axis similar to the previous exam. Scattered subcentimeter lymph nodes throughout both the left and right pelvis with similar appearance to the previous exam. Reproductive: Right adnexal masslike area 5.8 x 4.2 cm (image 63, series 2) previously 8.4 x 5.3 cm. Left adnexal lesion (image 63, series 2) 5.3 x 3.7 cm previously 6.6 x 3.8 cm. Persistent endometrial thickening not well assessed on CT. Stranding in the right inguinal region tracking towards the abdominal wall likely related to prior inguinal hernia repair is unchanged Other: No abdominal wall hernia or abnormality. No abdominopelvic ascites. Subtle nodularity along the transverse mesocolon best seen on image 22 of series 4 measuring 11 mm. Musculoskeletal: No acute bone finding or destructive bone process. IMPRESSION: 1. Subtle nodularity along the transverse mesocolon measuring 11 mm is concerning for peritoneal carcinomatosis. Also associated with perihepatic ascites and enlarging juxta diaphragmatic nodal disease. Attention on follow-up. 2. Bilateral adnexal masses are similar in size to the previous exam. 3. Left para-aortic lymph node is slightly increased in size from the previous exam, slight interval enlargement is suspicious based on location for nodal involvement. Scattered subcentimeter lymph nodes throughout the left and right pelvis are similar to the previous exam. 4. Calcified coronary artery disease. 5. Low-density lesions in the liver are unchanged. 6. Persistent endometrial thickening not well assessed on CT. 7. Stranding in the right inguinal region tracking towards the abdominal wall likely related to prior inguinal hernia repair is unchanged. Area of discrete nodularity in this location is no longer seen. Aortic Atherosclerosis (ICD10-I70.0). Electronically Signed   By: GZetta BillsM.D.   On:  09/05/2019 13:17

## 2019-09-06 NOTE — Assessment & Plan Note (Signed)
Her serum creatinine is satisfactory We discussed the importance of adequate hydration and risk factor modification 

## 2019-09-06 NOTE — Assessment & Plan Note (Signed)
Her blood pressure control is excellent I am concerned about progressive weight loss I have extensive discussion with the family They will call me and discontinue one of the diuretics if she lose more weight

## 2019-09-06 NOTE — Assessment & Plan Note (Signed)
She had recent weight loss due to reduced appetite We discussed dietary modification If her blood pressure drops further or she lose more weight, we might have to discontinue one of her diuretics She will try her best to add more dietary supplement

## 2019-09-06 NOTE — Telephone Encounter (Signed)
Called daughter and told her per Dr. Alvy Bimler. TSH is high. Ms. Velo need thyroid replacement. Synthroid Rx sent to pharmacy. Take 30 minutes prior to breakfast. Daughter verbalized understanding.

## 2019-09-06 NOTE — Assessment & Plan Note (Signed)
We have discussions about goals of care She has stable disease control on recent CT imaging We will continue treatment indefinitely

## 2019-09-06 NOTE — Patient Instructions (Signed)

## 2019-09-06 NOTE — Assessment & Plan Note (Signed)
She tolerated treatment well so far Her blood pressure control at home is satisfactory We will proceed with treatment today I have reviewed CT imaging with the family extensively The borderline enlarged lymph node, in my opinion, is stable The uterine mass is improved She tolerated treatment fairly well I plan to repeat another CT imaging again in 3 months, due around July

## 2019-09-07 ENCOUNTER — Telehealth: Payer: Self-pay

## 2019-09-07 ENCOUNTER — Telehealth: Payer: Self-pay | Admitting: Hematology and Oncology

## 2019-09-07 LAB — T4: T4, Total: 4.2 ug/dL — ABNORMAL LOW (ref 4.5–12.0)

## 2019-09-07 NOTE — Telephone Encounter (Signed)
Faxed Lipid panel results to Dr. Higinio Plan at [phone]. Received conformation.

## 2019-09-07 NOTE — Telephone Encounter (Signed)
Scheduled appt per 4/6 sch message  Unable to reach pt .left message with appt date and time

## 2019-09-07 NOTE — Telephone Encounter (Signed)
-----   Message from [name], MD sent at 09/06/2019  6:28 PM EDT ----- Regarding: pls forward result of lipid panel to her physician  ----- Message ----- From: Interface, Lab In [place] Sent: 09/06/2019  12:15 PM EDT To: [name], MD

## 2019-09-08 ENCOUNTER — Other Ambulatory Visit: Payer: Self-pay

## 2019-09-08 ENCOUNTER — Ambulatory Visit (INDEPENDENT_AMBULATORY_CARE_PROVIDER_SITE_OTHER): Payer: Medicare Other | Admitting: Family Medicine

## 2019-09-08 ENCOUNTER — Encounter: Payer: Self-pay | Admitting: Family Medicine

## 2019-09-08 VITALS — BP 118/60 | HR 91 | Wt 224.4 lb

## 2019-09-08 DIAGNOSIS — L97909 Non-pressure chronic ulcer of unspecified part of unspecified lower leg with unspecified severity: Secondary | ICD-10-CM | POA: Diagnosis not present

## 2019-09-08 DIAGNOSIS — I83009 Varicose veins of unspecified lower extremity with ulcer of unspecified site: Secondary | ICD-10-CM

## 2019-09-08 LAB — CA 125: Cancer Antigen (CA) 125: 214 U/mL — ABNORMAL HIGH (ref 0.0–38.1)

## 2019-09-08 MED ORDER — HYDROCORTISONE 0.5 % EX OINT: 1.0000 "application " | TOPICAL_OINTMENT | Freq: Two times a day (BID) | CUTANEOUS | 0 refills | Status: DC

## 2019-09-08 NOTE — Patient Instructions (Signed)
It was a pleasure to see you today! Thank you for choosing [facility] for your primary care. Erin Esparza was seen for leg wound. Come back to the clinic if there is anything we can do for you.   Today we looked at the wound on her leg which looks to be a venous stasis ulcer.  We think that you can stop putting antibiotics on that this does not appear to be infected but we can give you some hydrocortisone cream which might help.  The main treatment for this will be elevation whenever you are not standing and wearing your compression stockings every day.  If you start to get a fever or if this starts to look infected and get worse please get it evaluated by Dr. Immediately.   Please bring all your medications to every doctors visit   Sign up for My Chart to have easy access to your labs results, and communication with your Primary care physician.     Please check-out at the front desk before leaving the clinic.     Best,  Dr. Sherene Sires FAMILY MEDICINE RESIDENT - PGY3 09/08/2019 4:35 PM

## 2019-09-09 ENCOUNTER — Telehealth: Payer: Self-pay

## 2019-09-09 DIAGNOSIS — L97909 Non-pressure chronic ulcer of unspecified part of unspecified lower leg with unspecified severity: Secondary | ICD-10-CM | POA: Insufficient documentation

## 2019-09-09 DIAGNOSIS — I83009 Varicose veins of unspecified lower extremity with ulcer of unspecified site: Secondary | ICD-10-CM | POA: Insufficient documentation

## 2019-09-09 NOTE — Progress Notes (Signed)
    SUBJECTIVE:   CHIEF COMPLAINT / HPI:   New venous ulcer on medial left leg at distal tibia.  Lower legs bilaterally have had chronic skin changes due to venous stasis edema.  There is no surrounding cellulitis and no report of fever or injury.  Patient has been covering this with Neosporin, of note she says that she has not been using her prescribed compression stockings  PERTINENT  PMH / PSH: Venous insufficiency  OBJECTIVE:   BP 118/60   Pulse 91   Wt 224 lb 6.4 oz (101.8 kg)   SpO2 99%   BMI 35.15 kg/m   General: Pleasant and alert, no acute distress Skin exam: Approximately 3 inches in diameter skin breakdown consistent with venous ulcer, no surrounding cellulitis, no purulence, no bleeding.  No asymmetric distal deficits, there is bilateral thickening skin changes consistent with venous insufficiency on both legs approximately distal two thirds of the tibia bilaterally.  ASSESSMENT/PLAN:   Venous ulcer ([place]) Advised to stop using the Neosporin ointment, we have prescribed some hydrocortisone cream that she can use.  Until she picks that up and when she runs out she can use Vaseline to avoid something sticking to the skin and damaging it further.  Advised to use nonstick gauze and we have given her some extras.  Thorough discussion that the best long-term treatment for this issue is to wear her compression stockings daily and that whenever she is not standing to immediately elevate her feet to avoid the pulling effect of fluid.  Patient does understand     Sherene Sires, [name]

## 2019-09-09 NOTE — Telephone Encounter (Signed)
Patient calls nurse line stating she spoke with her Oncologist and it is safe for her to use the cream given at apt yesterday. Patient advised the cream should be at her pharmacy.

## 2019-09-09 NOTE — Telephone Encounter (Signed)
Returned call to patient. She saw PCP yesterday for a scratch on her leg. He told her to apply hydrocortisone cream on the area. She is asking if that is okay? Told her yes she can apply the hydrocortisone cream per the PCP instructions.

## 2019-09-09 NOTE — Assessment & Plan Note (Signed)
Advised to stop using the Neosporin ointment, we have prescribed some hydrocortisone cream that she can use.  Until she picks that up and when she runs out she can use Vaseline to avoid something sticking to the skin and damaging it further.  Advised to use nonstick gauze and we have given her some extras.  Thorough discussion that the best long-term treatment for this issue is to wear her compression stockings daily and that whenever she is not standing to immediately elevate her feet to avoid the pulling effect of fluid.  Patient does understand

## 2019-09-15 ENCOUNTER — Ambulatory Visit: Payer: Medicare Other

## 2019-09-15 NOTE — Telephone Encounter (Signed)
Patient calls nurse line stating Hydrocortisone 0.5% is available OTC so therefore insurance will not cover. The patient is fine with obtaining OTC, however can only find Hydrocortisone 1%. Patient wants to check with PCP before purchasing since the strengths are a little different. I advised her, more than likely ok, however will forward to PCP. Please advise.

## 2019-09-16 NOTE — Telephone Encounter (Signed)
Pt informed of below.Anniyah Mood Zimmerman Rumple, CMA   Sherene Sires, DO  [facility] 2 hours ago (7:50 AM)   The 1% over the counter hydrocortisone is fine to use instead of the rx'd 0.5%   Message text

## 2019-09-17 ENCOUNTER — Ambulatory Visit: Payer: Medicare Other | Attending: Internal Medicine

## 2019-09-17 DIAGNOSIS — Z23 Encounter for immunization: Secondary | ICD-10-CM

## 2019-09-17 NOTE — Progress Notes (Signed)
   Covid-19 Vaccination Clinic  Name:  Erin Esparza    MRN: [id number] DOB: 10-Nov-1940  09/17/2019  Erin Esparza was observed post Covid-19 immunization for 15 minutes without incident. She was provided with Vaccine Information Sheet and instruction to access the V-Safe system.   Erin Esparza was instructed to call 911 with any severe reactions post vaccine: Marland Kitchen Difficulty breathing  . Swelling of face and throat  . A fast heartbeat  . A bad rash all over body  . Dizziness and weakness   Immunizations Administered    Name Date Dose VIS Date Route   Pfizer COVID-19 Vaccine 09/17/2019 12:32 PM 0.3 mL 05/13/2019 Intramuscular   Manufacturer: [employer]   Lot: [id number]   [place]: [id number]

## 2019-09-20 ENCOUNTER — Other Ambulatory Visit: Payer: Self-pay

## 2019-09-20 ENCOUNTER — Ambulatory Visit (INDEPENDENT_AMBULATORY_CARE_PROVIDER_SITE_OTHER): Payer: Medicare Other | Admitting: Family Medicine

## 2019-09-20 VITALS — BP 120/64 | HR 89 | Ht 67.0 in | Wt 221.0 lb

## 2019-09-20 DIAGNOSIS — I83009 Varicose veins of unspecified lower extremity with ulcer of unspecified site: Secondary | ICD-10-CM | POA: Diagnosis not present

## 2019-09-20 DIAGNOSIS — L97909 Non-pressure chronic ulcer of unspecified part of unspecified lower leg with unspecified severity: Secondary | ICD-10-CM | POA: Diagnosis not present

## 2019-09-20 NOTE — Patient Instructions (Signed)
Thank you for coming to see me today. It was a pleasure! Today we talked about:   We applied a boot today that is meant to help your venous stasis ulcer on your leg heal.  You will need to change this in 1 week.  I have scheduled you a follow-up appointment with me on 4/28 at 2:10 PM.  Please arrive 10 minutes early.  If you develop any worsening pain in your foot, or unable to move your toes or develop fevers or chills then please be seen sooner by a physician.  Please enjoy your time at the beach!  If you have any questions or concerns, please do not hesitate to call the office at [phone].  Take Care,   [place] Dawnelle Warman, DO  [facility] An Louretta [place] boot is a type of bandage (dressing) for the foot and leg. The dressing is a gauze wrap that is soaked with a type of medicine called zinc oxide. The gauze may also include other lotions and medicines that help in wound healing, such as calamine. An Unna boot may be used to treat:  Open sores (ulcers) on the foot, heel, or leg.  Swelling from disorders that affect the veins or lymphatic system (lymphedema).  Skin conditions such as chronic inflammation caused by poor blood flow (stasis dermatitis). The dressing is applied by a health care provider. The gauze is wrapped around your lower extremity in several layers, usually starting at the toes and going upward to the knee. A dry outer wrap goes over the medicated wrap for support and compression.  Before applying the [employer], your health care provider will clean your leg and foot and may apply an antibiotic ointment. You may be asked to raise (elevate) your leg for a while to reduce swelling before the boot is applied. The boot will dry and harden after it is applied. The boot may need to be changed or replaced about twice a week. Follow these instructions at home: [place] as told by your health care provider.  You may need to wear a slipper or shoe over the boot that  is one or two sizes larger than normal.  Check the skin around the boot every day. Tell your health care provider about any concerns.  Do not stick anything inside the boot to scratch your skin. Doing that increases your risk of infection.  Keep your [employer] clean and dry.  Check every day for signs of infection. Check for: ? Redness, swelling, or pain in your foot or toes. ? Fluid or blood coming from the boot. ? Pus or a bad smell coming from the boot.  Remove the boot and call your health care provider if you have signs of poor blood flow, such as: ? Your toes tingle or become numb. ? Your toes turn cold or turn blue or pale. ? Your toes are more swollen or painful. ? You are unable to move your toes. Activity  You may walk with the boot once it has dried. Ask your health care provider how much walking is safe for you.  Avoid sitting for a long time without moving. Get up to take short walks as told by your health care provider. This is important to improve blood flow. Bathing  Do not take baths, swim, or use a hot tub until your health care provider approves. Ask your health care provider if you may take showers.  If your health care provider  approves a bath or a shower, do not let the Unna boot get wet. ? If you take a shower, cover the boot with a watertight covering. ? If you take a bath, keep your leg with the boot out of the tub. General instructions  Keep your leg elevated above the level of your heart while you are sitting or lying down. This will decrease swelling.  Do not sit with your knee bent for long periods of time.  Take over-the-counter and prescription medicines only as told by your health care provider.  Do not use any products that contain nicotine or tobacco, such as cigarettes, e-cigarettes, and chewing tobacco. These can delay healing. If you need help quitting, ask your health care provider.  Keep all follow-up visits as told by your health care  provider. This is important. Contact a health care provider if:  Your skin feels itchy inside the boot.  You have a burning sensation, a rash, or itchy, red, swollen areas of skin (hives) in the boot area.  You have a fever or chills.  You have any signs of infection, such as: ? New redness, swelling, or pain. ? More fluid or blood coming from the boot. ? Pus or a bad smell coming from the boot.  You have increased numbness or pain in your foot or toes.  You have any changes in skin color on your foot or toes, such as the skin turning blue or pale or developing patchy areas with spots.  Your boot has been damaged or feels like it is no longer fitting properly. Summary  An Louretta Parma boot is a type of bandage (dressing) system for the foot and leg.  The dressing is a gauze wrap that is soaked with a type of medicine (zinc oxide) to treat foot, heel, or leg ulcers, swelling from disorders that affect the veins or lymphatic system (lymphedema), and skin conditions caused by poor blood flow (stasis dermatitis).  This dressing is applied by a health care provider. After it is applied, the boot will dry and harden.  The boot may need to be changed or replaced about twice a week.  Let your health care provider know if you have any signs of poor blood flow or infection. This information is not intended to replace advice given to you by your health care provider. Make sure you discuss any questions you have with your health care provider. Document Revised: 09/07/2018 Document Reviewed: 01/27/2018 Elsevier Patient Education  2020 [employer].

## 2019-09-20 NOTE — Progress Notes (Signed)
   SUBJECTIVE:   CHIEF COMPLAINT / HPI:   Venous stasis ulcer on left leg: Patient seen on 04/08 for new ulcer on left medial leg over distal tibia. She reports being given steroid cream to use over the ulcer. She has been using this along with hydrogen peroxide and compression stockings. Patient denies any improvement. She reports she came back in today because the peroxide continues to bubble up a lot. She denies any draining from the area. She denies any fevers, chills, new redness.  PERTINENT  PMH / PSH: HTN, hx venous stasis, h/o uterine cancer, CKD stage III, peripheral neuropathy s/p chemotherapy  OBJECTIVE:  BP 120/64   Pulse 89   Ht 5\' 7"  (1.702 m)   Wt 221 lb (100.2 kg)   SpO2 99%   BMI 34.61 kg/m   General: NAD, pleasant Respiratory: normal work of breathing Neuro: CN II-XII grossly intact Psych: AOx3, appropriate affect Extremities: no LE swelling, thickened tissue on BLLE from stasis dermatitis, wound pictured below on L lower leg      ASSESSMENT/PLAN:   Venous ulcer ([place]) Patient with non healing venous ulcer likely to using inappropriate medications for treatment such as steroid cream and hydrogen peroxide. Patient has been using compression stocking but area not improving. Patient reports it is worsening. No systemic symptoms.  -stop steroid cream and hydrogen peroxide as they both decrease healing -Unna boot applied today given length of treatment without improvement, patient given handout on care -follow up in 1 week to reassess healing and apply another unna boot if not significantly improved, appt made for 4/28 -return precautions dicussed and patient voiced understanding    [place] Noemie Devivo, DO PGY-3, [place]

## 2019-09-24 ENCOUNTER — Encounter: Payer: Self-pay | Admitting: Family Medicine

## 2019-09-24 NOTE — Assessment & Plan Note (Addendum)
Patient with non healing venous ulcer likely to using inappropriate medications for treatment such as steroid cream and hydrogen peroxide. Patient has been using compression stocking but area not improving. Patient reports it is worsening. No systemic symptoms.  -stop steroid cream and hydrogen peroxide as they both decrease healing -Unna boot applied today given length of treatment without improvement, patient given handout on care -follow up in 1 week to reassess healing and apply another unna boot if not significantly improved, appt made for 4/28 -return precautions dicussed and patient voiced understanding

## 2019-09-27 MED FILL — LENVIMA 10 MG DAILY DOSE: 10 | 30 days supply | Qty: 30 | Fill #4

## 2019-09-28 ENCOUNTER — Other Ambulatory Visit: Payer: Self-pay

## 2019-09-28 ENCOUNTER — Encounter: Payer: Self-pay | Admitting: Podiatry

## 2019-09-28 ENCOUNTER — Ambulatory Visit: Payer: Medicare Other

## 2019-09-28 ENCOUNTER — Ambulatory Visit (INDEPENDENT_AMBULATORY_CARE_PROVIDER_SITE_OTHER): Payer: Medicare Other | Admitting: Podiatry

## 2019-09-28 ENCOUNTER — Ambulatory Visit (INDEPENDENT_AMBULATORY_CARE_PROVIDER_SITE_OTHER): Payer: Medicare Other | Admitting: Family Medicine

## 2019-09-28 DIAGNOSIS — M21611 Bunion of right foot: Secondary | ICD-10-CM | POA: Diagnosis not present

## 2019-09-28 DIAGNOSIS — L97909 Non-pressure chronic ulcer of unspecified part of unspecified lower leg with unspecified severity: Secondary | ICD-10-CM

## 2019-09-28 DIAGNOSIS — L84 Corns and callosities: Secondary | ICD-10-CM | POA: Diagnosis not present

## 2019-09-28 DIAGNOSIS — I83009 Varicose veins of unspecified lower extremity with ulcer of unspecified site: Secondary | ICD-10-CM

## 2019-09-28 DIAGNOSIS — M21612 Bunion of left foot: Secondary | ICD-10-CM

## 2019-09-28 NOTE — Progress Notes (Signed)
Subjective:   Patient ID: Erin Esparza, female   DOB: 79 y.o.   MRN: [id number]   HPI Patient states that her fifth toe has been very sore also she was concerned about her bunion with 2 separate problems being presented today   ROS      Objective:  Physical Exam  Neurovascular status intact with keratotic lesion digit 5 right with rotation of the toe and moderate structural bunion deformity right that improved with previous treatment but can be irritative     Assessment:  Digital deformity fifth right with keratotic lesion that is painful along with structural bunion     Plan:  H&P reviewed both conditions discussed surgical options but at this point do not recommend and sterile sharp debridement of lesion fifth right accomplished with no iatrogenic bleeding advised on wider toe shoes and mesh material

## 2019-09-28 NOTE — Progress Notes (Signed)
   SUBJECTIVE:   CHIEF COMPLAINT / HPI:   Venous stasis ulcer of left leg: Patient last seen on 09/20/2019 where Unna boot was placed on venous stasis ulcer.  Ulcer originally examined on 4/8 where patient was using steroid cream and hydrogen peroxide along with compression stockings for treatment and there was no improvement.  Patient today is reporting that her leg is much less painful.  She states she is feeling better overall.  She denies any fevers, chills.  PERTINENT  PMH / PSH: HTN, hx venous stasis, h/o uterine cancer, CKD stage III, peripheral neuropathy s/p chemotherapy  OBJECTIVE:  BP 128/64   Pulse 75   Wt 218 lb (98.9 kg)   SpO2 100%   BMI 34.14 kg/m   General: NAD, pleasant Respiratory: normal work of breathing Neuro: CN II-XII grossly intact Psych: AOx3, appropriate affect Extremities: no LE swelling, thickened tissue on BLLE from stasis dermatitis, wound pictured below on L lower leg    ASSESSMENT/PLAN:   Venous ulcer ([facility]) Wound appears to be healing well with Unna boot.  Discussed options with patient including not replacing Unna boot in her doing wound care at home.  Patient would like to continue with current treatment as area has had significant improvement in past week. Unna boot replaced and patient has follow-up scheduled 5/5 for removal of Unna boot.  Return precautions discussed, patient voiced understanding.    [place] Shirley, DO PGY-3, [facility]

## 2019-09-28 NOTE — Progress Notes (Signed)
Pharmacist Chemotherapy Monitoring - Follow Up Assessment    I verify that I have reviewed each item in the below checklist:  . Regimen for the patient is scheduled for the appropriate day and plan matches scheduled date. Marland Kitchen Appropriate non-routine labs are ordered dependent on drug ordered. . If applicable, additional medications reviewed and ordered per protocol based on lifetime cumulative doses and/or treatment regimen.   Plan for follow-up and/or issues identified: No . I-vent associated with next due treatment: No . MD and/or nursing notified: No  Judge Stall 09/28/2019 3:33 PM

## 2019-09-28 NOTE — Patient Instructions (Signed)
Thank you for coming to see me today. It was a pleasure!   We replaced your unna boot today. Your wound looks much better!  Please follow-up with me on 5/5 or sooner as needed.  If you have any questions or concerns, please do not hesitate to call the office at [phone].  Take Care,   [place] Ameya Kutz, DO

## 2019-09-30 ENCOUNTER — Other Ambulatory Visit: Payer: Self-pay | Admitting: Family Medicine

## 2019-09-30 DIAGNOSIS — E785 Hyperlipidemia, unspecified: Secondary | ICD-10-CM

## 2019-09-30 MED ORDER — ROSUVASTATIN CALCIUM 10 MG PO TABS: 10.0000 mg | ORAL_TABLET | Freq: Every day | ORAL | 0 refills | Status: AC

## 2019-09-30 NOTE — Assessment & Plan Note (Signed)
Wound appears to be healing well with Unna boot.  Discussed options with patient including not replacing Unna boot in her doing wound care at home.  Patient would like to continue with current treatment as area has had significant improvement in past week. Unna boot replaced and patient has follow-up scheduled 5/5 for removal of Unna boot.  Return precautions discussed, patient voiced understanding.

## 2019-09-30 NOTE — Progress Notes (Signed)
Late documentation.  Spoke with the patient while she was in the clinic on 09/28/2019 in reference to her hyperlipidemia.  Discussed that I never received information from her ophthalmologist, Dr. Zenia Resides office, however noted LDL 137 on recent lipid panel.  ASCVD 10-year risk 17%.  Discussed that the addition of a statin would be for primary prevention and may have limited benefit given her age and multiple comorbidities including active uterine cancer recently underwent chemotherapy, CKD stage III, hypertension, and history of left breast cancer.  However is not unreasonable to provide some risk reduction. Through shared decision making with patient, decided on starting a moderate intensity statin.  Counseled on common side effects.   Sent in Crestor 10 mg to her pharmacy.   Erin Clan, DO

## 2019-10-04 ENCOUNTER — Other Ambulatory Visit: Payer: Self-pay

## 2019-10-04 ENCOUNTER — Inpatient Hospital Stay: Payer: Medicare Other | Attending: Gynecologic Oncology

## 2019-10-04 ENCOUNTER — Encounter: Payer: Self-pay | Admitting: Hematology and Oncology

## 2019-10-04 ENCOUNTER — Inpatient Hospital Stay (HOSPITAL_BASED_OUTPATIENT_CLINIC_OR_DEPARTMENT_OTHER): Payer: Medicare Other | Admitting: Hematology and Oncology

## 2019-10-04 ENCOUNTER — Inpatient Hospital Stay: Payer: Medicare Other

## 2019-10-04 ENCOUNTER — Telehealth: Payer: Self-pay

## 2019-10-04 ENCOUNTER — Other Ambulatory Visit: Payer: Self-pay | Admitting: Hematology and Oncology

## 2019-10-04 DIAGNOSIS — R5383 Other fatigue: Secondary | ICD-10-CM | POA: Insufficient documentation

## 2019-10-04 DIAGNOSIS — D6481 Anemia due to antineoplastic chemotherapy: Secondary | ICD-10-CM | POA: Diagnosis not present

## 2019-10-04 DIAGNOSIS — C541 Malignant neoplasm of endometrium: Secondary | ICD-10-CM | POA: Insufficient documentation

## 2019-10-04 DIAGNOSIS — C55 Malignant neoplasm of uterus, part unspecified: Secondary | ICD-10-CM

## 2019-10-04 DIAGNOSIS — R946 Abnormal results of thyroid function studies: Secondary | ICD-10-CM

## 2019-10-04 DIAGNOSIS — N189 Chronic kidney disease, unspecified: Secondary | ICD-10-CM | POA: Diagnosis not present

## 2019-10-04 DIAGNOSIS — I10 Essential (primary) hypertension: Secondary | ICD-10-CM | POA: Diagnosis not present

## 2019-10-04 DIAGNOSIS — Z5112 Encounter for antineoplastic immunotherapy: Secondary | ICD-10-CM | POA: Insufficient documentation

## 2019-10-04 DIAGNOSIS — T451X5A Adverse effect of antineoplastic and immunosuppressive drugs, initial encounter: Secondary | ICD-10-CM

## 2019-10-04 DIAGNOSIS — D631 Anemia in chronic kidney disease: Secondary | ICD-10-CM | POA: Insufficient documentation

## 2019-10-04 DIAGNOSIS — Z853 Personal history of malignant neoplasm of breast: Secondary | ICD-10-CM | POA: Diagnosis not present

## 2019-10-04 LAB — CMP (CANCER CENTER ONLY)
ALT: 12 U/L (ref 0–44)
AST: 31 U/L (ref 15–41)
Albumin: 3.1 g/dL — ABNORMAL LOW (ref 3.5–5.0)
Alkaline Phosphatase: 55 U/L (ref 38–126)
Anion gap: 6 (ref 5–15)
BUN: 13 mg/dL (ref 8–23)
CO2: 28 mmol/L (ref 22–32)
Calcium: 9 mg/dL (ref 8.9–10.3)
Chloride: 101 mmol/L (ref 98–111)
Creatinine: 0.97 mg/dL (ref 0.44–1.00)
GFR, Est AFR Am: >60 mL/min (ref >60)
GFR, Estimated: 55 mL/min — ABNORMAL LOW (ref >60)
Glucose, Bld: 103 mg/dL — ABNORMAL HIGH (ref 70–99)
Potassium: 3.9 mmol/L (ref 3.5–5.1)
Sodium: 135 mmol/L (ref 135–145)
Total Bilirubin: 0.5 mg/dL (ref 0.3–1.2)
Total Protein: 7 g/dL (ref 6.5–8.1)

## 2019-10-04 LAB — CBC WITH DIFFERENTIAL (CANCER CENTER ONLY)
Abs Immature Granulocytes: 0.01 10*3/uL (ref 0.00–0.07)
Basophils Absolute: 0 10*3/uL (ref 0.0–0.1)
Basophils Relative: 1 %
Eosinophils Absolute: 0.4 10*3/uL (ref 0.0–0.5)
Eosinophils Relative: 7 %
HCT: 36.2 % (ref 36.0–46.0)
Hemoglobin: 11.9 g/dL — ABNORMAL LOW (ref 12.0–15.0)
Immature Granulocytes: 0 %
Lymphocytes Relative: 23 %
Lymphs Abs: 1.1 10*3/uL (ref 0.7–4.0)
MCH: 32.3 pg (ref 26.0–34.0)
MCHC: 32.9 g/dL (ref 30.0–36.0)
MCV: 98.4 fL (ref 80.0–100.0)
Monocytes Absolute: 0.6 10*3/uL (ref 0.1–1.0)
Monocytes Relative: 13 %
Neutro Abs: 2.7 10*3/uL (ref 1.7–7.7)
Neutrophils Relative %: 56 %
Platelet Count: 154 10*3/uL (ref 150–400)
RBC: 3.68 MIL/uL — ABNORMAL LOW (ref 3.87–5.11)
RDW: 16.5 % — ABNORMAL HIGH (ref 11.5–15.5)
WBC Count: 4.8 10*3/uL (ref 4.0–10.5)
nRBC: 0 % (ref 0.0–0.2)

## 2019-10-04 LAB — TOTAL PROTEIN, URINE DIPSTICK

## 2019-10-04 LAB — TSH: TSH: 40.883 u[IU]/mL — ABNORMAL HIGH (ref 0.308–3.960)

## 2019-10-04 MED ORDER — SODIUM CHLORIDE 0.9% FLUSH
10.0000 mL | Freq: Once | INTRAVENOUS | Status: AC
  Administered 2019-10-04: 10 mL
  Filled 2019-10-04: qty 10

## 2019-10-04 MED ORDER — LEVOTHYROXINE SODIUM 75 MCG PO TABS: 75.0000 ug | ORAL_TABLET | Freq: Every day | ORAL | 11 refills | Status: DC

## 2019-10-04 MED ORDER — HEPARIN SOD (PORK) LOCK FLUSH 100 UNIT/ML IV SOLN
500.0000 [IU] | Freq: Once | INTRAVENOUS | Status: AC | PRN
  Administered 2019-10-04: 13:00:00 500 [IU]
  Filled 2019-10-04: qty 5

## 2019-10-04 MED ORDER — SODIUM CHLORIDE 0.9 % IV SOLN
200.0000 mg | Freq: Once | INTRAVENOUS | Status: AC
  Administered 2019-10-04: 200 mg via INTRAVENOUS
  Filled 2019-10-04: qty 8

## 2019-10-04 MED ORDER — SODIUM CHLORIDE 0.9 % IV SOLN
Freq: Once | INTRAVENOUS | Status: AC
  Filled 2019-10-04: qty 250

## 2019-10-04 MED ORDER — SODIUM CHLORIDE 0.9% FLUSH
10.0000 mL | INTRAVENOUS | Status: DC | PRN
  Administered 2019-10-04: 10 mL
  Filled 2019-10-04: qty 10

## 2019-10-04 NOTE — Progress Notes (Signed)
[place] OFFICE PROGRESS NOTE  Patient Care Team: [name], DO as PCP - General (Family Medicine) [name], MD as Consulting Physician (Ophthalmology) [name], DPM as Consulting Physician (Podiatry) [name], [name], MD as Referring Physician (Optometry) [name], MD (Inactive) as Consulting Physician (Orthopedic Surgery) [name], RN as Oncology Nurse Navigator (Oncology)  ASSESSMENT & PLAN:  Uterine cancer [facility]) She tolerated treatment well so far Her blood pressure control at home is satisfactory We will proceed with treatment today I plan to repeat another CT imaging again in a few months, due around July  Abnormal thyroid function test This is due to side effects of treatment I will adjust the dose of Synthroid  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   All questions were answered. The patient knows to call the clinic with any problems, questions or concerns. The total time spent in the appointment was 20 minutes encounter with patients including review of chart and various tests results, discussions about plan of care and coordination of care plan   [name], MD 10/04/2019 3:52 PM  INTERVAL HISTORY: Please see below for problem oriented charting. 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 Ultrasound irregular hypoechoic mass 7 x 6 x 5 mm   01/06/2012 Initial Biopsy   Initial biopsy  was benign . 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 Cervix, biopsy - HIGH GRADE CARCINOMA. - SEE COMMENT. 2. Endocervix, curettage - HIGH GRADE CARCINOMA. - SEE COMMENT. 3. Endometrium, biopsy - HIGH GRADE CARCINOMA. - SEE COMMENT. Microscopic Comment 1. - 3. The carcinoma in the three specimens is morphologically similar and has features suggestive of papillary serous carcinoma.   07/26/2018 Tumor Marker   Patient's tumor was tested for the following markers: CA-125 Results of the tumor marker test revealed 835   07/28/2018 Imaging   1. Several (at least 10) solid pulmonary nodules scattered throughout both lungs are new since 2012 chest CT, likely representing pulmonary metastases, largest 6 mm, below PET resolution. 2. Redemonstration of hypodense 2.5 cm segment 7 right liver lobe mass, indeterminate, suspicious for liver metastasis. 3. 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 4. Three-vessel coronary atherosclerosis.  Aortic Atherosclerosis (ICD10-I70.0).  08/03/2018 Cancer Staging   Staging form: Corpus Uteri - Carcinoma and Carcinosarcoma, AJCC 8th Edition - Clinical: FIGO Stage IVB (cT3b, cN2, cM1) - Signed by [name], MD on 08/03/2018   08/05/2018  Tumor Marker   Patient's tumor was tested for the following markers: CA-125 Results of the tumor marker test revealed 901   08/06/2018 Procedure   Placement of single lumen port a cath via right internal jugular vein. The catheter tip lies at the cavo-atrial junction. A power injectable port a cath was placed and is ready for immediate use.    08/11/2018 - 01/12/2019 Chemotherapy   The patient had carboplatin and taxol x 8 cycles   09/01/2018 Tumor Marker   Patient's tumor was tested for the following markers: CA-125 Results of the tumor marker test revealed 406   09/09/2018 Genetic Testing   The Common Hereditary Cancers Panel offered by [facility] includes sequencing and/or deletion duplication testing of the following 48 genes: APC, ATM, AXIN2, BARD1, BMPR1A, BRCA1, BRCA2, BRIP1, CDH1, CDKN2A (p14ARF), CDKN2A (p16INK4a), CKD4, CHEK2, CTNNA1, DICER1, EPCAM (Deletion/duplication testing only), GREM1 (promoter region deletion/duplication testing only), KIT, MEN1, MLH1, MSH2, MSH3, MSH6, MUTYH, NBN, NF1, NHTL1, PALB2, PDGFRA, PMS2, POLD1, POLE, PTEN, RAD50, RAD51C, RAD51D, RNF43, SDHB, SDHC, SDHD, SMAD4, SMARCA4. STK11, TP53, TSC1, TSC2, and VHL.  The following genes were evaluated for sequence changes only: SDHA and HOXB13 c.251G>A variant only.  Results: Negative, no pathogenic variants identified. The report date is 09/09/2018.     09/22/2018 Tumor Marker   Patient's tumor was tested for the following markers: CA-125 Results of the tumor marker test revealed 110   10/12/2018 Tumor Marker   Patient's tumor was tested for the following markers: CA-125 Results of the tumor marker test revealed 55.3   10/12/2018 Imaging   1. Mild decrease in pulmonary metastases, liver metastases, and metastatic abdominal and pelvic lymphadenopathy. 2. Decreased size of complex cystic lesion in left presacral region. 3. No new or progressive metastatic disease identified. 4. Stable small right inguinal hernia  containing small portion of urinary bladder.   11/03/2018 Tumor Marker   Patient's tumor was tested for the following markers: CA-125 Results of the tumor marker test revealed 34.3    Genetic Testing   Patient has genetic testing done for HER2. Results revealed patient has the following: HER2 - negative from pathology from 07/26/18.   12/20/2018 Imaging   1. Possible new omental nodule. 2. Improving abdominal and pelvic retroperitoneal adenopathy. 3. Stable pulmonary and hepatic metastatic disease. 4. Left perirectal/presacral fluid collection has decreased in size. 5. Right inguinal hernia contains a small portion of the bladder, as before. 6. Aortic atherosclerosis (ICD10-170.0). Coronary artery calcification   12/21/2018 Tumor Marker   Patient's tumor was tested for the following markers: CA-125 Results of the tumor marker test revealed 15.6   01/15/2019 Imaging   Ct abdomen and pelvis 1. Small hiatal hernia with fluid in the distal esophagus, which may represent reflux or esophagitis. No other acute findings. 2. Right inguinal hernia contains anterolateral aspect of the urinary bladder, unchanged in appearance from prior. Mild edema within the hernia sac is similar. 3. Unchanged small low-density lesions in the liver. 4. Unchanged retroperitoneal and bilateral iliac lymph nodes. Small ventral abdominal omental nodule tentatively identified and unchanged from prior.   Aortic Atherosclerosis (ICD10-I70.0).   02/02/2019 Tumor Marker   Patient's tumor was tested for the following markers: CA-125 Results of the tumor marker test revealed 7.8   03/22/2019 Surgery   Preoperative diagnosis:  History of incarcerated right inguinal hernia   Postop diagnosis: Incarcerated right inguinal hernia with preperitoneal fat without strangulation or obstruction   Procedure: Repair of right inguinal hernia with ultra Pro hernia system mesh   Surgeon: Erroll Luna, MD   03/31/2019 Tumor Marker    Patient's tumor was tested for the following markers: CA-`125 Results of the tumor marker test revealed 24.4.   05/25/2019 Tumor Marker   Patient's tumor was tested for the following markers: CA-125 Results of the tumor marker test revealed 287   05/25/2019 Imaging   New solid bilateral adnexal masses, consistent with metastatic disease.   New peritoneal soft tissue nodules in the anterior right pelvis, suspicious for peritoneal carcinomatosis. No evidence of ascites.   No significant change in shotty sub-cm abdominal retroperitoneal and bilateral iliac lymph nodes.   06/09/2019 -  Chemotherapy   The patient had pembrolizumab (KEYTRUDA) 200 mg in sodium chloride 0.9 % 50 mL chemo infusion, 200 mg, Intravenous, Once, 6 of 6 cycles Administration: 200 mg (06/09/2019), 200 mg (07/26/2019), 200 mg (08/16/2019), 200 mg (10/04/2019), 200 mg (06/30/2019), 200 mg (09/06/2019)  for chemotherapy treatment.    06/09/2019 Tumor Marker   Patient's tumor was tested for the following markers: CA-125 Results of the tumor marker test revealed 468   07/26/2019 Tumor Marker   Patient's tumor was tested for the following markers: CA-125 Results of the tumor marker test revealed 282   08/17/2019 Tumor Marker   Patient's tumor was tested for the following markers: CA-125 Results of the tumor marker test revealed 190.   09/05/2019 Imaging   1. Subtle nodularity along the transverse mesocolon measuring 11 mm is concerning for peritoneal carcinomatosis. Also associated with perihepatic ascites and enlarging juxta diaphragmatic nodal disease.  Attention on follow-up. 2. Bilateral adnexal masses are similar in size to the previous exam. 3. Left para-aortic lymph node is slightly increased in size from the previous exam, slight interval enlargement is suspicious based on location for nodal involvement. Scattered subcentimeter lymph nodes throughout the left and right pelvis are similar to the previous exam. 4. Calcified  coronary artery disease. 5. Low-density lesions in the liver are unchanged. 6. Persistent endometrial thickening not well assessed on CT. 7. Stranding in the right inguinal region tracking towards the abdominal wall likely related to prior inguinal hernia repair is unchanged. Area of discrete nodularity in this location is no longer seen.   Aortic Atherosclerosis (ICD10-I70.0).       09/07/2019 Tumor Marker   Patient's tumor was tested for the following markers: CA-125 Results of the tumor marker test revealed 214     REVIEW OF SYSTEMS:   Constitutional: Denies fevers, chills or abnormal weight loss Eyes: Denies blurriness of vision Ears, nose, mouth, throat, and face: Denies mucositis or sore throat Respiratory: Denies cough, dyspnea or wheezes Cardiovascular: Denies palpitation, chest discomfort or lower extremity swelling Gastrointestinal:  Denies nausea, heartburn or change in bowel habits Skin: Denies abnormal skin rashes Lymphatics: Denies new lymphadenopathy or easy bruising Neurological:Denies numbness, tingling or new weaknesses Behavioral/Psych: Mood is stable, no new changes  All other systems were reviewed with the patient and are negative.  I have reviewed the past medical history, past surgical history, social history and family history with the patient and they are unchanged from previous note.  ALLERGIES:  has No Known Allergies.  MEDICATIONS:  Current Outpatient Medications  Medication Sig Dispense Refill  . acetaminophen (TYLENOL) 500 MG tablet Take 1 tablet (500 mg total) by mouth every  6 (six) hours as needed. (Patient taking differently: Take 1,000 mg by mouth every 6 (six) hours as needed for moderate pain. ) 30 tablet 3  . Calcium Carbonate (CALTRATE 600 PO) Take 1 tablet by mouth 2 (two) times daily.     . enalapril (VASOTEC) 20 MG tablet TAKE 1 TABLET(20 MG) BY MOUTH TWICE DAILY (Patient taking differently: Take 20 mg by mouth daily. ) 180 tablet 2  .  Fluticasone-Salmeterol (ADVAIR DISKUS) 100-50 MCG/DOSE AEPB USE 1 INHALATION BY MOUTH TWICE DAILY 60 each 2  . furosemide (LASIX) 40 MG tablet TAKE 1 TABLET BY MOUTH DAILY AS NEEDED FOR LEG SWELLING (Patient taking differently: Take 40 mg by mouth daily as needed (leg swelling). ) 90 tablet 2  . hydrochlorothiazide (HYDRODIURIL) 25 MG tablet Take 1 tablet (25 mg total) by mouth daily. 90 tablet 2  . lenvatinib 10 mg daily dose (LENVIMA) capsule Take 1 capsule (10 mg total) by mouth daily. 30 capsule 11  . levothyroxine (SYNTHROID) 75 MCG tablet Take 1 tablet (75 mcg total) by mouth daily before breakfast. 30 tablet 11  . lidocaine-prilocaine (EMLA) cream Apply to affected area once (Patient taking differently: Apply 1 application topically daily as needed. Apply to affected area once) 30 g 3  . Multiple Vitamin (MULTIVITAMIN) tablet Take 1 tablet by mouth daily.    . Multiple Vitamins-Minerals (ICAPS AREDS 2 PO) Take 1 capsule by mouth 2 (two) times daily.    . ondansetron (ZOFRAN) 8 MG tablet Take 1 tablet (8 mg total) by mouth every 8 (eight) hours as needed. Start on the third day after chemotherapy. 30 tablet 1  . oxyCODONE (OXY IR/ROXICODONE) 5 MG immediate release tablet Take 1 tablet (5 mg total) by mouth every 6 (six) hours as needed for severe pain. 60 tablet 0  . polyethylene glycol (MIRALAX / GLYCOLAX) 17 g packet Take 17 g by mouth daily as needed for moderate constipation.    . prochlorperazine (COMPAZINE) 10 MG tablet Take 1 tablet (10 mg total) by mouth every 6 (six) hours as needed (Nausea or vomiting). 30 tablet 1  . rivaroxaban (XARELTO) 20 MG TABS tablet Take 1 tablet (20 mg total) by mouth daily with supper. 90 tablet 1  . rosuvastatin (CRESTOR) 10 MG tablet Take 1 tablet (10 mg total) by mouth daily. Start with 8m for the first week. 90 tablet 0  . Vitamin D, Cholecalciferol, 1000 units CAPS Take 1,000 Units by mouth daily.      No current facility-administered medications for  this visit.   Facility-Administered Medications Ordered in Other Visits  Medication Dose Route Frequency Provider Last Rate Last Admin  . sodium chloride flush (NS) 0.9 % injection 10 mL  10 mL Intracatheter PRN GAlvy Bimler Mykaila Blunck, MD   10 mL at 10/04/19 1311    PHYSICAL EXAMINATION: ECOG PERFORMANCE STATUS: 2 - Symptomatic, <50% confined to bed  Vitals:   10/04/19 1115  BP: 128/63  Pulse: 78  Temp: 98.7 F (37.1 C)  SpO2: 100%   Filed Weights   10/04/19 1115  Weight: 218 lb 12.8 oz (99.2 kg)    GENERAL:alert, no distress and comfortable SKIN: skin color, texture, turgor are normal, no rashes or significant lesions EYES: normal, Conjunctiva are pink and non-injected, sclera clear OROPHARYNX:no exudate, no erythema and lips, buccal mucosa, and tongue normal  NECK: supple, thyroid normal size, non-tender, without nodularity LYMPH:  no palpable lymphadenopathy in the cervical, axillary or inguinal LUNGS: clear to auscultation and percussion with normal breathing  effort HEART: regular rate & rhythm and no murmurs with mild lower extremity edema ABDOMEN:abdomen soft, non-tender and normal bowel sounds Musculoskeletal:no cyanosis of digits and no clubbing  NEURO: alert & oriented x 3 with fluent speech, no focal motor/sensory deficits  LABORATORY DATA:  I have reviewed the data as listed    Component Value Date/Time   NA 135 10/04/2019 1050   NA 142 12/25/2014 1122   K 3.9 10/04/2019 1050   K 4.1 12/25/2014 1122   CL 101 10/04/2019 1050   CL 102 10/04/2012 1159   CO2 28 10/04/2019 1050   CO2 29 12/25/2014 1122   GLUCOSE 103 (H) 10/04/2019 1050   GLUCOSE 99 12/25/2014 1122   GLUCOSE 116 (H) 10/04/2012 1159   BUN 13 10/04/2019 1050   BUN 13.7 12/25/2014 1122   CREATININE 0.97 10/04/2019 1050   CREATININE 0.96 (H) 02/27/2016 1530   CREATININE 0.9 12/25/2014 1122   CALCIUM 9.0 10/04/2019 1050   CALCIUM 10.1 12/25/2014 1122   PROT 7.0 10/04/2019 1050   PROT 7.8 12/25/2014 1122    ALBUMIN 3.1 (L) 10/04/2019 1050   ALBUMIN 3.4 (L) 12/25/2014 1122   AST 31 10/04/2019 1050   AST 21 12/25/2014 1122   ALT 12 10/04/2019 1050   ALT 17 12/25/2014 1122   ALKPHOS 55 10/04/2019 1050   ALKPHOS 99 12/25/2014 1122   BILITOT 0.5 10/04/2019 1050   BILITOT 0.41 12/25/2014 1122   GFRNONAA 55 (L) 10/04/2019 1050   GFRNONAA 58 (L) 02/27/2016 1530   GFRAA >60 10/04/2019 1050   GFRAA 66 02/27/2016 1530    No results found for: SPEP, UPEP  Lab Results  Component Value Date   WBC 4.8 10/04/2019   NEUTROABS 2.7 10/04/2019   HGB 11.9 (L) 10/04/2019   HCT 36.2 10/04/2019   MCV 98.4 10/04/2019   PLT 154 10/04/2019      Chemistry      Component Value Date/Time   NA 135 10/04/2019 1050   NA 142 12/25/2014 1122   K 3.9 10/04/2019 1050   K 4.1 12/25/2014 1122   CL 101 10/04/2019 1050   CL 102 10/04/2012 1159   CO2 28 10/04/2019 1050   CO2 29 12/25/2014 1122   BUN 13 10/04/2019 1050   BUN 13.7 12/25/2014 1122   CREATININE 0.97 10/04/2019 1050   CREATININE 0.96 (H) 02/27/2016 1530   CREATININE 0.9 12/25/2014 1122      Component Value Date/Time   CALCIUM 9.0 10/04/2019 1050   CALCIUM 10.1 12/25/2014 1122   ALKPHOS 55 10/04/2019 1050   ALKPHOS 99 12/25/2014 1122   AST 31 10/04/2019 1050   AST 21 12/25/2014 1122   ALT 12 10/04/2019 1050   ALT 17 12/25/2014 1122   BILITOT 0.5 10/04/2019 1050   BILITOT 0.41 12/25/2014 1122

## 2019-10-04 NOTE — Assessment & Plan Note (Signed)
This is due to side effects of treatment I will adjust the dose of Synthroid

## 2019-10-04 NOTE — Assessment & Plan Note (Signed)
She has chronic baseline anemia due to chronic kidney disease She is mildly symptomatic Recommend observation only for now 

## 2019-10-04 NOTE — Telephone Encounter (Signed)
Called back and left a message. She forgot to mention in today's appt that she saw a MD and they mentioned starting on cholesterol medication. She is asking if it okay to start the medication. Left a message that Dr. Alvy Bimler is okay with her starting the medication.

## 2019-10-04 NOTE — Patient Instructions (Signed)
[facility] Discharge Instructions for Patients Receiving Chemotherapy  Today you received the following chemotherapy agents Pembrolizumab (KEYTRUDA).  To help prevent nausea and vomiting after your treatment, we encourage you to take your nausea medication as prescribed.   If you develop nausea and vomiting that is not controlled by your nausea medication, call the clinic.   BELOW ARE SYMPTOMS THAT SHOULD BE REPORTED IMMEDIATELY:  *FEVER GREATER THAN 100.5 F  *CHILLS WITH OR WITHOUT FEVER  NAUSEA AND VOMITING THAT IS NOT CONTROLLED WITH YOUR NAUSEA MEDICATION  *UNUSUAL SHORTNESS OF BREATH  *UNUSUAL BRUISING OR BLEEDING  TENDERNESS IN MOUTH AND THROAT WITH OR WITHOUT PRESENCE OF ULCERS  *URINARY PROBLEMS  *BOWEL PROBLEMS  UNUSUAL RASH Items with * indicate a potential emergency and should be followed up as soon as possible.  Feel free to call the clinic should you have any questions or concerns. The clinic phone number is [phone].  Please show the CHEMO ALERT CARD at check-in to the Emergency Department and triage nurse.   

## 2019-10-04 NOTE — Telephone Encounter (Signed)
Called and given message from Dr. Alvy Bimler, her Erin Esparza is still quite high. I sent in new prescription of 75 mcg. Keep the 50 mcg but start taking the new dose tomorrow.  She verbalized understanding.

## 2019-10-04 NOTE — Assessment & Plan Note (Signed)
She tolerated treatment well so far Her blood pressure control at home is satisfactory We will proceed with treatment today I plan to repeat another CT imaging again in a few months, due around July

## 2019-10-05 ENCOUNTER — Ambulatory Visit (INDEPENDENT_AMBULATORY_CARE_PROVIDER_SITE_OTHER): Payer: Medicare Other | Admitting: Family Medicine

## 2019-10-05 ENCOUNTER — Encounter: Payer: Self-pay | Admitting: Family Medicine

## 2019-10-05 ENCOUNTER — Telehealth: Payer: Self-pay | Admitting: Hematology and Oncology

## 2019-10-05 ENCOUNTER — Other Ambulatory Visit: Payer: Self-pay

## 2019-10-05 DIAGNOSIS — L97909 Non-pressure chronic ulcer of unspecified part of unspecified lower leg with unspecified severity: Secondary | ICD-10-CM

## 2019-10-05 DIAGNOSIS — I83009 Varicose veins of unspecified lower extremity with ulcer of unspecified site: Secondary | ICD-10-CM

## 2019-10-05 LAB — CA 125: Cancer Antigen (CA) 125: 328 U/mL — ABNORMAL HIGH (ref 0.0–38.1)

## 2019-10-05 LAB — T4: T4, Total: 4.8 ug/dL (ref 4.5–12.0)

## 2019-10-05 NOTE — Telephone Encounter (Signed)
Scheduled appts per 5/4 los. Left voicemail with appt date and time.

## 2019-10-05 NOTE — Patient Instructions (Signed)
Thank you for coming to see me today. It was a pleasure! Today we talked about:   For your ulcer you may continue to clean the area and use Neosporin.  It will be important that you continue to wear compression stockings regularly.  Please follow-up with me in 1 week on 10/12/2019 at 3:10 PM.  Please do not use any hydrogen peroxide or steroid cream on the area.  Please follow-up with me as above or sooner as needed.  If you have any questions or concerns, please do not hesitate to call the office at [phone].  Take Care,   [place] Deandre Brannan, DO

## 2019-10-05 NOTE — Progress Notes (Signed)
   SUBJECTIVE:   CHIEF COMPLAINT / HPI:   Venous stasis ulcer of left leg: Patient last seen on 09/28/2019 where Unna boot was placed on venous stasis ulcer.  Ulcer originally examined on 4/8 where patient was using steroid cream and hydrogen peroxide along with compression stockings for treatment and there was no improvement.  Patient today is reporting that her leg is much less painful.  She states she is feeling better overall.  She denies any fevers, chills.  PERTINENT  PMH / PSH: HTN, hx venous stasis, h/o uterine cancer, CKD stage III, peripheral neuropathy s/p chemotherapy  OBJECTIVE:  BP 124/60   Pulse 92   Ht 5\' 7"  (1.702 m)   Wt 215 lb (97.5 kg)   SpO2 96%   BMI 33.67 kg/m   General: NAD, pleasant Respiratory: normal work of breathing Psych: AOx3, appropriate affect Extremities: no LE swelling, thickened tissue on BLLE from stasis dermatitis, wound pictured below on L lower leg    ASSESSMENT/PLAN:   Venous ulcer ([place]) Unna boot removed today and ulcer appears to be healing well. Patient counseled to avoid peroxide and steroid cream. Advised to use nonstick gauzes and keep compression on area with compression stockings daily and that whenever she is not standing to immediately elevate her feet. Return in 1 week for follow up to ensure healing after d/c of unna boot.     [place] Vera Furniss, DO PGY-3, [facility]

## 2019-10-09 NOTE — Assessment & Plan Note (Signed)
Unna boot removed today and ulcer appears to be healing well. Patient counseled to avoid peroxide and steroid cream. Advised to use nonstick gauzes and keep compression on area with compression stockings daily and that whenever she is not standing to immediately elevate her feet. Return in 1 week for follow up to ensure healing after d/c of unna boot.

## 2019-10-10 ENCOUNTER — Other Ambulatory Visit: Payer: Self-pay | Admitting: *Deleted

## 2019-10-10 MED ORDER — RIVAROXABAN 20 MG PO TABS: 20.0000 mg | ORAL_TABLET | Freq: Every day | ORAL | 3 refills | Status: AC

## 2019-10-12 ENCOUNTER — Ambulatory Visit: Payer: Medicare Other | Attending: Internal Medicine

## 2019-10-12 ENCOUNTER — Encounter: Payer: Self-pay | Admitting: Family Medicine

## 2019-10-12 ENCOUNTER — Ambulatory Visit (INDEPENDENT_AMBULATORY_CARE_PROVIDER_SITE_OTHER): Payer: Medicare Other | Admitting: Family Medicine

## 2019-10-12 ENCOUNTER — Other Ambulatory Visit: Payer: Self-pay

## 2019-10-12 DIAGNOSIS — I83009 Varicose veins of unspecified lower extremity with ulcer of unspecified site: Secondary | ICD-10-CM | POA: Diagnosis not present

## 2019-10-12 DIAGNOSIS — L97909 Non-pressure chronic ulcer of unspecified part of unspecified lower leg with unspecified severity: Secondary | ICD-10-CM | POA: Diagnosis not present

## 2019-10-12 DIAGNOSIS — Z23 Encounter for immunization: Secondary | ICD-10-CM

## 2019-10-12 DIAGNOSIS — L89311 Pressure ulcer of right buttock, stage 1: Secondary | ICD-10-CM | POA: Diagnosis not present

## 2019-10-12 NOTE — Progress Notes (Signed)
   SUBJECTIVE:   CHIEF COMPLAINT / HPI:   Stage 1 gluteal ulcer Patient reports that she has also noticed that she has a sore on her buttocks. She states that she has been trying to get up more than normal after developing this. She denies any fevers, chills, bleeding.  Venous stasis ulcer of left leg: Patient last seen on 10/05/2019 where we discontinued using the Unna boot was placed on venous stasis ulcer. Ulcer originally examined on 4/8 where patient was using steroid cream and hydrogen peroxide along with compression stockings for treatment and there was no improvement. Patient today is reporting that her leg is continuing to improve. She states she is feeling better overall. She denies any fevers, chills.  PERTINENT  PMH / PSH: HTN, hx venous stasis, h/o uterine cancer, CKD stage III, peripheral neuropathy s/p chemotherapy  OBJECTIVE:  BP 120/64   Pulse 74   Ht 5\' 7"  (1.702 m)   Wt 215 lb (97.5 kg)   BMI 33.67 kg/m   General: NAD, pleasant Neck: Supple Respiratory: normal work of breathing Buttocks: Stage 1 gluteal ulcer noted on right buttocks. No current erythema or discharge from area. Extremities: no LE swelling, thickened tissue on BLLE from stasis dermatitis, venous stasis ulcer on left lower extremity improved with continued signs of healing Psych: AOx3, appropriate affect   ASSESSMENT/PLAN:   Venous ulcer ([place]) Patient has gone 1 week without in a boot and continues to have good healing of venous stasis ulcer. She has been continuing to use nonstick gauze with compression stockings daily. Patient to return on 5/24 for check of gluteal ulcer and will also recheck to ensure that left lower extremity wound is continuing to heal.  Stage I pressure ulcer of buttock Patient with stage 1 pressure ulcer of buttocks. Patient reports that she has been trying to move around. Encouraged patient to use barrier cream on the area. Patient is to get up at least once every 2 hours  and encouraged to switch positions as much as she is able to. Also encouraged patient to purchase a soft cushion or pillow doughnut from the pharmacy so that she may sit on this while at home. If she develops any fevers, chills, worsening skin breakdown then she is to return to the office to be evaluated.     Erin Jayr Lupercio, DO PGY-3, [facility]

## 2019-10-12 NOTE — Patient Instructions (Signed)
Thank you for coming to see me today. It was a pleasure! Today we talked about:   For your gluteal ulcer.  You will need to use a good barrier cream on the area very regularly and keep it as clean and dry as you are able.  I recommend Desitin as a barrier cream.  You should only use mild soap and water in order to clean the area.  Again do not use hydrogen peroxide on this area.  Please try to change position as much as possible.  I recommend either getting up or moving around at least once every 2 hours, however more would be better.  I recommend getting a soft cushion or pillow or doughnut from the store.  There is none that I can find that insurance will cover.  I placed an example of a doughnut cushion below.  Please follow-up with me on 10/24/2019 at 10:30 AM.  If you need to reschedule then please do so.   If you have any questions or concerns, please do not hesitate to call the office at [phone].  Take Care,   [place] Emilie Carp, DO

## 2019-10-12 NOTE — Progress Notes (Signed)
[facility]  Name:  NELA GANDER    MRN: [id number] DOB: 02/22/1941  10/12/2019  Ms. Urso was observed post Covid-19 immunization for 15 minutes without incident. She was provided with Vaccine Information Sheet and instruction to access the V-Safe system.   Ms. Shankar was instructed to call 911 with any severe reactions post vaccine: Marland Kitchen Difficulty breathing  . Swelling of face and throat  . A fast heartbeat  . A bad rash all over body  . Dizziness and weakness   Immunizations Administered    Name Date Dose VIS Date Route   Pfizer COVID-19 Vaccine 10/12/2019 12:36 PM 0.3 mL 07/27/2018 Intramuscular   Manufacturer: [place]   Lot: [id number]   [address]: [id number]

## 2019-10-16 DIAGNOSIS — L89301 Pressure ulcer of unspecified buttock, stage 1: Secondary | ICD-10-CM | POA: Insufficient documentation

## 2019-10-16 NOTE — Assessment & Plan Note (Addendum)
Patient with stage 1 pressure ulcer of buttocks. Patient reports that she has been trying to move around. Encouraged patient to use barrier cream on the area. Patient is to get up at least once every 2 hours and encouraged to switch positions as much as she is able to. Also encouraged patient to purchase a soft cushion or pillow doughnut from the pharmacy so that she may sit on this while at home. If she develops any fevers, chills, worsening skin breakdown then she is to return to the office to be evaluated.

## 2019-10-16 NOTE — Assessment & Plan Note (Addendum)
Patient has gone 1 week without in a boot and continues to have good healing of venous stasis ulcer. She has been continuing to use nonstick gauze with compression stockings daily. Patient to return on 5/24 for check of gluteal ulcer and will also recheck to ensure that left lower extremity wound is continuing to heal.

## 2019-10-24 ENCOUNTER — Encounter: Payer: Self-pay | Admitting: Family Medicine

## 2019-10-24 ENCOUNTER — Other Ambulatory Visit: Payer: Self-pay

## 2019-10-24 ENCOUNTER — Ambulatory Visit (INDEPENDENT_AMBULATORY_CARE_PROVIDER_SITE_OTHER): Payer: Medicare Other | Admitting: Family Medicine

## 2019-10-24 DIAGNOSIS — L97909 Non-pressure chronic ulcer of unspecified part of unspecified lower leg with unspecified severity: Secondary | ICD-10-CM

## 2019-10-24 DIAGNOSIS — I83009 Varicose veins of unspecified lower extremity with ulcer of unspecified site: Secondary | ICD-10-CM

## 2019-10-24 DIAGNOSIS — L89311 Pressure ulcer of right buttock, stage 1: Secondary | ICD-10-CM

## 2019-10-24 NOTE — Progress Notes (Signed)
   SUBJECTIVE:   CHIEF COMPLAINT / HPI:   Stage 1 gluteal ulcer Since last being seen on 5/12.  Patient reports that her ulcer seems to be doing better.  She has been using Vaseline on the area and tried to use a barrier cream such as Desitin but did not have much luck.  Patient did get a pillow and has been walking around more to avoid keeping weight on the area. She denies any fevers, chills, bleeding.  Venous stasis ulcer of left leg: Patient last seen on 10/12/2019 and patient was to continue compression stockings, Neosporin and cleaning the area periodically as it had improved greatly.  Venous stasis ulcer originally examined on 4/8 where patient was using steroid cream and hydrogen peroxide along with compression stockings for treatment and there was no improvement. Patient today is reporting that her leg now has some area in the middle the does not appear to be healing as well.  She denies any pain. She states she is feeling about the same overall. She denies any fevers, chills.  PERTINENT PMH / PSH: HTN, hx venous stasis, h/o uterine cancer, CKD stage III, peripheral neuropathy s/p chemotherapy  OBJECTIVE:  BP 110/60   Pulse 90   Wt 209 lb 4 oz (94.9 kg)   SpO2 98% Comment: 2nd attempt  BMI 32.77 kg/m   General: NAD, pleasant Neck: Supple Respiratory:  normal work of breathing Buttocks: Stage 1 gluteal ulcer noted on right buttocks. No current erythema or discharge from area that is improved from last visit and appears to be healing well. Extremities: no LE swelling, thickened tissue on BLLE from stasis dermatitis, venous stasis ulcer on left lower extremity with now central area worsening.   Psych: AOx3, appropriate affect  ASSESSMENT/PLAN:   Venous ulcer ([place]) Patient with now new area that is not healing as well on her left lower extremity due to venous stasis ulcer.  Colloid dressing applied today with compression.  Offered patient an Haematologist as she had success with  this previously but she would like to hold off on Unna boot at this time and may consider at follow-up visit if it is not improved with a colloid.  Follow-up on 6/1 and stricter precautions discussed and patient voiced understanding.  Stage I pressure ulcer of buttock Improved at today's visit.  Patient to continue using Vaseline as well as pillow and getting up regularly.  We will follow-up on 6/1 to ensure that it is healing well.   Martinique Braylyn Eye, DO PGY-3, [facility]

## 2019-10-24 NOTE — Patient Instructions (Signed)
Thank you for coming to see me today. It was a pleasure! Today we talked about:   I have placed the colloid dressing on your leg wound. Please keep this one until follow up. It will be important that you continue to wear compression stockings regularly.  Please follow-up with me in 1 week on 6/1//2021 at 2:30 PM.  Please follow-up sooner if you have concerns.  If you have any questions or concerns, please do not hesitate to call the office at [phone].  Take Care,   [place]ique Shirley, DO

## 2019-10-25 ENCOUNTER — Other Ambulatory Visit: Payer: Self-pay

## 2019-10-25 ENCOUNTER — Inpatient Hospital Stay: Payer: Medicare Other

## 2019-10-25 ENCOUNTER — Telehealth: Payer: Self-pay | Admitting: Hematology and Oncology

## 2019-10-25 ENCOUNTER — Other Ambulatory Visit: Payer: Self-pay | Admitting: Hematology and Oncology

## 2019-10-25 ENCOUNTER — Inpatient Hospital Stay (HOSPITAL_BASED_OUTPATIENT_CLINIC_OR_DEPARTMENT_OTHER): Payer: Medicare Other | Admitting: Hematology and Oncology

## 2019-10-25 DIAGNOSIS — N189 Chronic kidney disease, unspecified: Secondary | ICD-10-CM | POA: Diagnosis not present

## 2019-10-25 DIAGNOSIS — C55 Malignant neoplasm of uterus, part unspecified: Secondary | ICD-10-CM | POA: Diagnosis not present

## 2019-10-25 DIAGNOSIS — R946 Abnormal results of thyroid function studies: Secondary | ICD-10-CM | POA: Diagnosis not present

## 2019-10-25 DIAGNOSIS — R5383 Other fatigue: Secondary | ICD-10-CM | POA: Diagnosis not present

## 2019-10-25 DIAGNOSIS — Z5112 Encounter for antineoplastic immunotherapy: Secondary | ICD-10-CM | POA: Diagnosis not present

## 2019-10-25 DIAGNOSIS — I10 Essential (primary) hypertension: Secondary | ICD-10-CM

## 2019-10-25 DIAGNOSIS — D631 Anemia in chronic kidney disease: Secondary | ICD-10-CM | POA: Diagnosis not present

## 2019-10-25 DIAGNOSIS — Z853 Personal history of malignant neoplasm of breast: Secondary | ICD-10-CM | POA: Diagnosis not present

## 2019-10-25 LAB — TSH: TSH: 25.74 u[IU]/mL — ABNORMAL HIGH (ref 0.308–3.960)

## 2019-10-25 LAB — CBC WITH DIFFERENTIAL (CANCER CENTER ONLY)
Abs Immature Granulocytes: 0.01 10*3/uL (ref 0.00–0.07)
Basophils Absolute: 0 10*3/uL (ref 0.0–0.1)
Basophils Relative: 1 %
Eosinophils Absolute: 0.3 10*3/uL (ref 0.0–0.5)
Eosinophils Relative: 6 %
HCT: 37.1 % (ref 36.0–46.0)
Hemoglobin: 12 g/dL (ref 12.0–15.0)
Immature Granulocytes: 0 %
Lymphocytes Relative: 24 %
Lymphs Abs: 1.1 10*3/uL (ref 0.7–4.0)
MCH: 32.8 pg (ref 26.0–34.0)
MCHC: 32.3 g/dL (ref 30.0–36.0)
MCV: 101.4 fL — ABNORMAL HIGH (ref 80.0–100.0)
Monocytes Absolute: 0.7 10*3/uL (ref 0.1–1.0)
Monocytes Relative: 16 %
Neutro Abs: 2.3 10*3/uL (ref 1.7–7.7)
Neutrophils Relative %: 53 %
Platelet Count: 166 10*3/uL (ref 150–400)
RBC: 3.66 MIL/uL — ABNORMAL LOW (ref 3.87–5.11)
RDW: 16.4 % — ABNORMAL HIGH (ref 11.5–15.5)
WBC Count: 4.3 10*3/uL (ref 4.0–10.5)
nRBC: 0 % (ref 0.0–0.2)

## 2019-10-25 LAB — CMP (CANCER CENTER ONLY)
ALT: 16 U/L (ref 0–44)
AST: 34 U/L (ref 15–41)
Albumin: 3.2 g/dL — ABNORMAL LOW (ref 3.5–5.0)
Alkaline Phosphatase: 56 U/L (ref 38–126)
Anion gap: 10 (ref 5–15)
BUN: 10 mg/dL (ref 8–23)
CO2: 26 mmol/L (ref 22–32)
Calcium: 9.1 mg/dL (ref 8.9–10.3)
Chloride: 102 mmol/L (ref 98–111)
Creatinine: 1.02 mg/dL — ABNORMAL HIGH (ref 0.44–1.00)
GFR, Est AFR Am: >60 mL/min (ref >60)
GFR, Estimated: 52 mL/min — ABNORMAL LOW (ref >60)
Glucose, Bld: 92 mg/dL (ref 70–99)
Potassium: 3.8 mmol/L (ref 3.5–5.1)
Sodium: 138 mmol/L (ref 135–145)
Total Bilirubin: 0.7 mg/dL (ref 0.3–1.2)
Total Protein: 7.2 g/dL (ref 6.5–8.1)

## 2019-10-25 LAB — TOTAL PROTEIN, URINE DIPSTICK: Protein, ur: 30 mg/dL — AB

## 2019-10-25 MED ORDER — SODIUM CHLORIDE 0.9% FLUSH
10.0000 mL | INTRAVENOUS | Status: DC | PRN
  Administered 2019-10-25: 10 mL
  Filled 2019-10-25: qty 10

## 2019-10-25 MED ORDER — SODIUM CHLORIDE 0.9 % IV SOLN
Freq: Once | INTRAVENOUS | Status: AC
  Filled 2019-10-25: qty 250

## 2019-10-25 MED ORDER — HEPARIN SOD (PORK) LOCK FLUSH 100 UNIT/ML IV SOLN
500.0000 [IU] | Freq: Once | INTRAVENOUS | Status: AC | PRN
  Administered 2019-10-25: 500 [IU]
  Filled 2019-10-25: qty 5

## 2019-10-25 MED ORDER — PROCHLORPERAZINE MALEATE 10 MG PO TABS: 10.0000 mg | ORAL_TABLET | Freq: Four times a day (QID) | ORAL | 1 refills | Status: AC | PRN

## 2019-10-25 MED ORDER — SODIUM CHLORIDE 0.9 % IV SOLN
200.0000 mg | Freq: Once | INTRAVENOUS | Status: AC
  Administered 2019-10-25: 200 mg via INTRAVENOUS
  Filled 2019-10-25: qty 8

## 2019-10-25 MED FILL — LENVIMA 10 MG DAILY DOSE: 10 | 30 days supply | Qty: 30 | Fill #5

## 2019-10-25 NOTE — Progress Notes (Signed)
[place] OFFICE PROGRESS NOTE  Patient Care Team: [name], DO as PCP - General (Family Medicine) [name], MD as Consulting Physician (Ophthalmology) [name], DPM as Consulting Physician (Podiatry) [name], [name], MD as Referring Physician (Optometry) [name], MD (Inactive) as Consulting Physician (Orthopedic Surgery) [name], RN as Oncology Nurse Navigator (Oncology)  ASSESSMENT & PLAN:  Uterine cancer [facility]) She tolerated treatment well so far Her blood pressure control is low due to recent weight loss We will proceed with treatment today I plan to repeat another CT imaging again in a few months, due around July  Abnormal thyroid function test Her TSH has improved since introduction of Synthroid We will continue to adjust the dose as needed For now, I do not plan to change the Synthroid dose today  Essential hypertension Her blood pressure is low I recommend discontinuation of hydrochlorothiazide   No orders of the defined types were placed in this encounter.   All questions were answered. The patient knows to call the clinic with any problems, questions or concerns. The total time spent in the appointment was 20 minutes encounter with patients including review of chart and various tests results, discussions about plan of care and coordination of care plan   Erin Lark, MD 10/25/2019 12:59 PM  INTERVAL HISTORY: Please see below for problem oriented charting. 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 Ultrasound irregular  hypoechoic mass 7 x 6 x 5 mm   01/06/2012 Initial Biopsy   Initial biopsy was benign . 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 Cervix, biopsy - HIGH GRADE CARCINOMA. - SEE COMMENT. 2. Endocervix, curettage - HIGH GRADE CARCINOMA. - SEE COMMENT. 3. Endometrium, biopsy - HIGH GRADE CARCINOMA. - SEE COMMENT. Microscopic Comment 1. - 3. The carcinoma in the three specimens is morphologically similar and has features suggestive of papillary serous carcinoma.   07/26/2018 Tumor Marker   Patient's tumor was tested for the following markers: CA-125 Results of the tumor marker test revealed 835   07/28/2018 Imaging   1. Several (at least 10) solid pulmonary nodules scattered throughout both lungs are new since 2012 chest CT, likely representing pulmonary metastases, largest 6 mm, below PET resolution. 2. Redemonstration of hypodense 2.5 cm segment 7 right liver lobe mass, indeterminate, suspicious for liver metastasis. 3. Redemonstration of upper  left retroperitoneal metastatic adenopathy. 4. Three-vessel coronary atherosclerosis.  Aortic Atherosclerosis (ICD10-I70.0).   08/03/2018 Cancer Staging   Staging form: Corpus Uteri - Carcinoma and Carcinosarcoma, AJCC 8th Edition - Clinical: FIGO Stage  IVB (cT3b, cN2, cM1) - Signed by Erin Lark, MD on 08/03/2018   08/05/2018 Tumor Marker   Patient's tumor was tested for the following markers: CA-125 Results of the tumor marker test revealed 901   08/06/2018 Procedure   Placement of single lumen port a cath via right internal jugular vein. The catheter tip lies at the cavo-atrial junction. A power injectable port a cath was placed and is ready for immediate use.    08/11/2018 - 01/12/2019 Chemotherapy   The patient had carboplatin and taxol x 8 cycles   09/01/2018 Tumor Marker   Patient's tumor was tested for the following markers: CA-125 Results of the tumor marker test revealed 406   09/09/2018 Genetic Testing   The Common Hereditary Cancers Panel offered by [facility] includes sequencing and/or deletion duplication testing of the following 48 genes: APC, ATM, AXIN2, BARD1, BMPR1A, BRCA1, BRCA2, BRIP1, CDH1, CDKN2A (p14ARF), CDKN2A (p16INK4a), CKD4, CHEK2, CTNNA1, DICER1, EPCAM (Deletion/duplication testing only), GREM1 (promoter region deletion/duplication testing only), KIT, MEN1, MLH1, MSH2, MSH3, MSH6, MUTYH, NBN, NF1, NHTL1, PALB2, PDGFRA, PMS2, POLD1, POLE, PTEN, RAD50, RAD51C, RAD51D, RNF43, SDHB, SDHC, SDHD, SMAD4, SMARCA4. STK11, TP53, TSC1, TSC2, and VHL.  The following genes were evaluated for sequence changes only: SDHA and HOXB13 c.251G>A variant only.  Results: Negative, no pathogenic variants identified. The report date is 09/09/2018.     09/22/2018 Tumor Marker   Patient's tumor was tested for the following markers: CA-125 Results of the tumor marker test revealed 110   10/12/2018 Tumor Marker   Patient's tumor was tested for the following markers: CA-125 Results of the tumor marker test revealed 55.3   10/12/2018 Imaging   1. Mild decrease in pulmonary metastases, liver metastases, and metastatic abdominal and pelvic lymphadenopathy. 2. Decreased size of complex cystic lesion in left presacral region. 3. No new or progressive  metastatic disease identified. 4. Stable small right inguinal hernia containing small portion of urinary bladder.   11/03/2018 Tumor Marker   Patient's tumor was tested for the following markers: CA-125 Results of the tumor marker test revealed 34.3    Genetic Testing   Patient has genetic testing done for HER2. Results revealed patient has the following: HER2 - negative from pathology from 07/26/18.   12/20/2018 Imaging   1. Possible new omental nodule. 2. Improving abdominal and pelvic retroperitoneal adenopathy. 3. Stable pulmonary and hepatic metastatic disease. 4. Left perirectal/presacral fluid collection has decreased in size. 5. Right inguinal hernia contains a small portion of the bladder, as before. 6. Aortic atherosclerosis (ICD10-170.0). Coronary artery calcification   12/21/2018 Tumor Marker   Patient's tumor was tested for the following markers: CA-125 Results of the tumor marker test revealed 15.6   01/15/2019 Imaging   Ct abdomen and pelvis 1. Small hiatal hernia with fluid in the distal esophagus, which may represent reflux or esophagitis. No other acute findings. 2. Right inguinal hernia contains anterolateral aspect of the urinary bladder, unchanged in appearance from prior. Mild edema within the hernia sac is similar. 3. Unchanged small low-density lesions in the liver. 4. Unchanged retroperitoneal and bilateral iliac lymph nodes. Small ventral abdominal omental nodule tentatively identified and unchanged from prior.   Aortic Atherosclerosis (ICD10-I70.0).   02/02/2019 Tumor Marker   Patient's tumor was tested for the following markers: CA-125 Results of  the tumor marker test revealed 7.8   03/22/2019 Surgery   Preoperative diagnosis: History of incarcerated right inguinal hernia   Postop diagnosis: Incarcerated right inguinal hernia with preperitoneal fat without strangulation or obstruction   Procedure: Repair of right inguinal hernia with ultra Pro hernia  system mesh   Surgeon: Erroll Luna, MD   03/31/2019 Tumor Marker   Patient's tumor was tested for the following markers: CA-`125 Results of the tumor marker test revealed 24.4.   05/25/2019 Tumor Marker   Patient's tumor was tested for the following markers: CA-125 Results of the tumor marker test revealed 287   05/25/2019 Imaging   New solid bilateral adnexal masses, consistent with metastatic disease.   New peritoneal soft tissue nodules in the anterior right pelvis, suspicious for peritoneal carcinomatosis. No evidence of ascites.   No significant change in shotty sub-cm abdominal retroperitoneal and bilateral iliac lymph nodes.   06/09/2019 -  Chemotherapy   The patient had pembrolizumab (KEYTRUDA) 200 mg in sodium chloride 0.9 % 50 mL chemo infusion, 200 mg, Intravenous, Once, 7 of 8 cycles Administration: 200 mg (06/09/2019), 200 mg (07/26/2019), 200 mg (08/16/2019), 200 mg (10/04/2019), 200 mg (06/30/2019), 200 mg (09/06/2019), 200 mg (10/25/2019)  for chemotherapy treatment.    06/09/2019 Tumor Marker   Patient's tumor was tested for the following markers: CA-125 Results of the tumor marker test revealed 468   07/26/2019 Tumor Marker   Patient's tumor was tested for the following markers: CA-125 Results of the tumor marker test revealed 282   08/17/2019 Tumor Marker   Patient's tumor was tested for the following markers: CA-125 Results of the tumor marker test revealed 190.   09/05/2019 Imaging   1. Subtle nodularity along the transverse mesocolon measuring 11 mm is concerning for peritoneal carcinomatosis. Also associated with perihepatic ascites and enlarging juxta diaphragmatic nodal disease.  Attention on follow-up. 2. Bilateral adnexal masses are similar in size to the previous exam. 3. Left para-aortic lymph node is slightly increased in size from the previous exam, slight interval enlargement is suspicious based on location for nodal involvement. Scattered subcentimeter lymph  nodes throughout the left and right pelvis are similar to the previous exam. 4. Calcified coronary artery disease. 5. Low-density lesions in the liver are unchanged. 6. Persistent endometrial thickening not well assessed on CT. 7. Stranding in the right inguinal region tracking towards the abdominal wall likely related to prior inguinal hernia repair is unchanged. Area of discrete nodularity in this location is no longer seen.   Aortic Atherosclerosis (ICD10-I70.0).       09/07/2019 Tumor Marker   Patient's tumor was tested for the following markers: CA-125 Results of the tumor marker test revealed 214     REVIEW OF SYSTEMS:   Constitutional: Denies fevers, chills or abnormal weight loss Eyes: Denies blurriness of vision Ears, nose, mouth, throat, and face: Denies mucositis or sore throat Respiratory: Denies cough, dyspnea or wheezes Cardiovascular: Denies palpitation, chest discomfort or lower extremity swelling Gastrointestinal:  Denies nausea, heartburn or change in bowel habits Skin: Denies abnormal skin rashes Lymphatics: Denies new lymphadenopathy or easy bruising Neurological:Denies numbness, tingling or new weaknesses Behavioral/Psych: Mood is stable, no new changes  All other systems were reviewed with the patient and are negative.  I have reviewed the past medical history, past surgical history, social history and family history with the patient and they are unchanged from previous note.  ALLERGIES:  has No Known Allergies.  MEDICATIONS:  Current Outpatient Medications  Medication Sig Dispense  Refill  . acetaminophen (TYLENOL) 500 MG tablet Take 1 tablet (500 mg total) by mouth every 6 (six) hours as needed. (Patient taking differently: Take 1,000 mg by mouth every 6 (six) hours as needed for moderate pain. ) 30 tablet 3  . Calcium Carbonate (CALTRATE 600 PO) Take 1 tablet by mouth 2 (two) times daily.     . enalapril (VASOTEC) 20 MG tablet TAKE 1 TABLET(20 MG) BY MOUTH  TWICE DAILY (Patient taking differently: Take 20 mg by mouth daily. ) 180 tablet 2  . Fluticasone-Salmeterol (ADVAIR DISKUS) 100-50 MCG/DOSE AEPB USE 1 INHALATION BY MOUTH TWICE DAILY 60 each 2  . furosemide (LASIX) 40 MG tablet TAKE 1 TABLET BY MOUTH DAILY AS NEEDED FOR LEG SWELLING (Patient taking differently: Take 40 mg by mouth daily as needed (leg swelling). ) 90 tablet 2  . lenvatinib 10 mg daily dose (LENVIMA) capsule Take 1 capsule (10 mg total) by mouth daily. 30 capsule 11  . levothyroxine (SYNTHROID) 75 MCG tablet Take 1 tablet (75 mcg total) by mouth daily before breakfast. 30 tablet 11  . lidocaine-prilocaine (EMLA) cream Apply to affected area once (Patient taking differently: Apply 1 application topically daily as needed. Apply to affected area once) 30 g 3  . Multiple Vitamin (MULTIVITAMIN) tablet Take 1 tablet by mouth daily.    . Multiple Vitamins-Minerals (ICAPS AREDS 2 PO) Take 1 capsule by mouth 2 (two) times daily.    . ondansetron (ZOFRAN) 8 MG tablet Take 1 tablet (8 mg total) by mouth every 8 (eight) hours as needed. Start on the third day after chemotherapy. 30 tablet 1  . oxyCODONE (OXY IR/ROXICODONE) 5 MG immediate release tablet Take 1 tablet (5 mg total) by mouth every 6 (six) hours as needed for severe pain. 60 tablet 0  . polyethylene glycol (MIRALAX / GLYCOLAX) 17 g packet Take 17 g by mouth daily as needed for moderate constipation.    . prochlorperazine (COMPAZINE) 10 MG tablet Take 1 tablet (10 mg total) by mouth every 6 (six) hours as needed (Nausea or vomiting). 60 tablet 1  . rivaroxaban (XARELTO) 20 MG TABS tablet Take 1 tablet (20 mg total) by mouth daily with supper. 90 tablet 3  . rosuvastatin (CRESTOR) 10 MG tablet Take 1 tablet (10 mg total) by mouth daily. Start with 11m for the first week. 90 tablet 0  . Vitamin D, Cholecalciferol, 1000 units CAPS Take 1,000 Units by mouth daily.      No current facility-administered medications for this visit.    Facility-Administered Medications Ordered in Other Visits  Medication Dose Route Frequency Provider Last Rate Last Admin  . sodium chloride flush (NS) 0.9 % injection 10 mL  10 mL Intracatheter PRN GAlvy Bimler Tron Flythe, MD   10 mL at 10/25/19 1055    PHYSICAL EXAMINATION: ECOG PERFORMANCE STATUS: 1 - Symptomatic but completely ambulatory  Vitals:   10/25/19 0901  BP: 115/63  Pulse: 86  Resp: 18  Temp: 97.8 F (36.6 C)  SpO2: 100%   Filed Weights   10/25/19 0901  Weight: 212 lb 9.6 oz (96.4 kg)    GENERAL:alert, no distress and comfortable  NEURO: alert & oriented x 3 with fluent speech, no focal motor/sensory deficits  LABORATORY DATA:  I have reviewed the data as listed    Component Value Date/Time   NA 138 10/25/2019 0835   NA 142 12/25/2014 1122   K 3.8 10/25/2019 0835   K 4.1 12/25/2014 1122   CL 102 10/25/2019 0835  CL 102 10/04/2012 1159   CO2 26 10/25/2019 0835   CO2 29 12/25/2014 1122   GLUCOSE 92 10/25/2019 0835   GLUCOSE 99 12/25/2014 1122   GLUCOSE 116 (H) 10/04/2012 1159   BUN 10 10/25/2019 0835   BUN 13.7 12/25/2014 1122   CREATININE 1.02 (H) 10/25/2019 0835   CREATININE 0.96 (H) 02/27/2016 1530   CREATININE 0.9 12/25/2014 1122   CALCIUM 9.1 10/25/2019 0835   CALCIUM 10.1 12/25/2014 1122   PROT 7.2 10/25/2019 0835   PROT 7.8 12/25/2014 1122   ALBUMIN 3.2 (L) 10/25/2019 0835   ALBUMIN 3.4 (L) 12/25/2014 1122   AST 34 10/25/2019 0835   AST 21 12/25/2014 1122   ALT 16 10/25/2019 0835   ALT 17 12/25/2014 1122   ALKPHOS 56 10/25/2019 0835   ALKPHOS 99 12/25/2014 1122   BILITOT 0.7 10/25/2019 0835   BILITOT 0.41 12/25/2014 1122   GFRNONAA 52 (L) 10/25/2019 0835   GFRNONAA 58 (L) 02/27/2016 1530   GFRAA >60 10/25/2019 0835   GFRAA 66 02/27/2016 1530    No results found for: SPEP, UPEP  Lab Results  Component Value Date   WBC 4.3 10/25/2019   NEUTROABS 2.3 10/25/2019   HGB 12.0 10/25/2019   HCT 37.1 10/25/2019   MCV 101.4 (H) 10/25/2019    PLT 166 10/25/2019      Chemistry      Component Value Date/Time   NA 138 10/25/2019 0835   NA 142 12/25/2014 1122   K 3.8 10/25/2019 0835   K 4.1 12/25/2014 1122   CL 102 10/25/2019 0835   CL 102 10/04/2012 1159   CO2 26 10/25/2019 0835   CO2 29 12/25/2014 1122   BUN 10 10/25/2019 0835   BUN 13.7 12/25/2014 1122   CREATININE 1.02 (H) 10/25/2019 0835   CREATININE 0.96 (H) 02/27/2016 1530   CREATININE 0.9 12/25/2014 1122      Component Value Date/Time   CALCIUM 9.1 10/25/2019 0835   CALCIUM 10.1 12/25/2014 1122   ALKPHOS 56 10/25/2019 0835   ALKPHOS 99 12/25/2014 1122   AST 34 10/25/2019 0835   AST 21 12/25/2014 1122   ALT 16 10/25/2019 0835   ALT 17 12/25/2014 1122   BILITOT 0.7 10/25/2019 0835   BILITOT 0.41 12/25/2014 1122

## 2019-10-25 NOTE — Assessment & Plan Note (Signed)
She tolerated treatment well so far Her blood pressure control is low due to recent weight loss We will proceed with treatment today I plan to repeat another CT imaging again in a few months, due around July

## 2019-10-25 NOTE — Assessment & Plan Note (Signed)
Her TSH has improved since introduction of Synthroid We will continue to adjust the dose as needed For now, I do not plan to change the Synthroid dose today

## 2019-10-25 NOTE — Patient Instructions (Signed)
[facility] Discharge Instructions for Patients Receiving Chemotherapy  Today you received the following chemotherapy agents Pembrolizumab (KEYTRUDA).  To help prevent nausea and vomiting after your treatment, we encourage you to take your nausea medication as prescribed.   If you develop nausea and vomiting that is not controlled by your nausea medication, call the clinic.   BELOW ARE SYMPTOMS THAT SHOULD BE REPORTED IMMEDIATELY:  *FEVER GREATER THAN 100.5 F  *CHILLS WITH OR WITHOUT FEVER  NAUSEA AND VOMITING THAT IS NOT CONTROLLED WITH YOUR NAUSEA MEDICATION  *UNUSUAL SHORTNESS OF BREATH  *UNUSUAL BRUISING OR BLEEDING  TENDERNESS IN MOUTH AND THROAT WITH OR WITHOUT PRESENCE OF ULCERS  *URINARY PROBLEMS  *BOWEL PROBLEMS  UNUSUAL RASH Items with * indicate a potential emergency and should be followed up as soon as possible.  Feel free to call the clinic should you have any questions or concerns. The clinic phone number is [phone].  Please show the CHEMO ALERT CARD at check-in to the Emergency Department and triage nurse.   

## 2019-10-25 NOTE — Telephone Encounter (Signed)
Scheduled per 5/25 sch message. Pt aware of appts. Noted to give pt updated calendar.

## 2019-10-25 NOTE — Assessment & Plan Note (Signed)
Her blood pressure is low I recommend discontinuation of hydrochlorothiazide

## 2019-10-25 NOTE — Patient Instructions (Signed)
1) Please stop taking Hydrochlorothiazide (Blood pressure pill)

## 2019-10-26 LAB — T4: T4, Total: 7.3 ug/dL (ref 4.5–12.0)

## 2019-10-26 NOTE — Assessment & Plan Note (Signed)
Improved at today's visit.  Patient to continue using Vaseline as well as pillow and getting up regularly.  We will follow-up on 6/1 to ensure that it is healing well.

## 2019-10-26 NOTE — Assessment & Plan Note (Signed)
Patient with now new area that is not healing as well on her left lower extremity due to venous stasis ulcer.  Colloid dressing applied today with compression.  Offered patient an Haematologist as she had success with this previously but she would like to hold off on Unna boot at this time and may consider at follow-up visit if it is not improved with a colloid.  Follow-up on 6/1 and stricter precautions discussed and patient voiced understanding.

## 2019-11-01 ENCOUNTER — Encounter: Payer: Self-pay | Admitting: Family Medicine

## 2019-11-01 ENCOUNTER — Other Ambulatory Visit: Payer: Self-pay

## 2019-11-01 ENCOUNTER — Ambulatory Visit (INDEPENDENT_AMBULATORY_CARE_PROVIDER_SITE_OTHER): Payer: Medicare Other | Admitting: Family Medicine

## 2019-11-01 DIAGNOSIS — L97909 Non-pressure chronic ulcer of unspecified part of unspecified lower leg with unspecified severity: Secondary | ICD-10-CM

## 2019-11-01 DIAGNOSIS — I83009 Varicose veins of unspecified lower extremity with ulcer of unspecified site: Secondary | ICD-10-CM

## 2019-11-01 NOTE — Patient Instructions (Signed)
Thank you for coming to see me today. It was a pleasure! Today we talked about:   If you develop fevers, pain or cannot move your toes, you can be seen by a physician or go to the Emergency room.   Please follow-up with me on 6/9 at 11:10am or sooner as needed.  If you have any questions or concerns, please do not hesitate to call the office at [phone].  Take Care,   [place] Darvis Croft, DO

## 2019-11-01 NOTE — Progress Notes (Signed)
   SUBJECTIVE:   CHIEF COMPLAINT / HPI:   Venous stasis ulcer of left leg: Patient last seen on5/24/2021 and patient was to continue compression stockings, Neosporin and colloid dressing applied.  Venous stasis ulcer originally examined on 4/8 where patient was using steroid cream and hydrogen peroxide along with compression stockings for treatment and there was no improvement. She required an unna boot which allowed good healing.Patient today is reporting that her leg now is hurting and appears to have some new discharge. She is still taking regular chemo treatments which are likely interfering with healing. She denies any fevers, chills.  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   Psych: AOx3, appropriate affect    ASSESSMENT/PLAN:   Venous ulcer ([place]) Ulcer appears to be worsening after initial improvement, likely due to oncology treatments. Profore wrap applied today as office was out of unna boot supplies. Advised patient that whenever she is not standing to immediately elevate her feet. Return in 1 week for follow up to ensure healing and will likely need to reapply unna boot. If patient's leg does not improve, may need to consider wound care referral.     [place] Ossie Beltran, DO PGY-3, [place]

## 2019-11-03 NOTE — Assessment & Plan Note (Signed)
Ulcer appears to be worsening after initial improvement, likely due to oncology treatments. Profore wrap applied today as office was out of unna boot supplies. Advised patient that whenever she is not standing to immediately elevate her feet. Return in 1 week for follow up to ensure healing and will likely need to reapply unna boot. If patient's leg does not improve, may need to consider wound care referral.

## 2019-11-08 ENCOUNTER — Other Ambulatory Visit: Payer: Self-pay

## 2019-11-08 ENCOUNTER — Telehealth: Payer: Self-pay

## 2019-11-08 ENCOUNTER — Emergency Department (HOSPITAL_COMMUNITY)
Admission: EM | Admit: 2019-11-08 | Discharge: 2019-11-09 | Disposition: A | Discharge status: Home / Self Care | Payer: Medicare Other | Attending: Emergency Medicine | Admitting: Emergency Medicine

## 2019-11-08 ENCOUNTER — Emergency Department (HOSPITAL_COMMUNITY): Payer: Medicare Other

## 2019-11-08 ENCOUNTER — Encounter (HOSPITAL_COMMUNITY): Payer: Self-pay

## 2019-11-08 DIAGNOSIS — Z79899 Other long term (current) drug therapy: Secondary | ICD-10-CM | POA: Insufficient documentation

## 2019-11-08 DIAGNOSIS — J4530 Mild persistent asthma, uncomplicated: Secondary | ICD-10-CM | POA: Insufficient documentation

## 2019-11-08 DIAGNOSIS — Z8542 Personal history of malignant neoplasm of other parts of uterus: Secondary | ICD-10-CM | POA: Insufficient documentation

## 2019-11-08 DIAGNOSIS — I5030 Unspecified diastolic (congestive) heart failure: Secondary | ICD-10-CM | POA: Insufficient documentation

## 2019-11-08 DIAGNOSIS — I130 Hypertensive heart and chronic kidney disease with heart failure and stage 1 through stage 4 chronic kidney disease, or unspecified chronic kidney disease: Secondary | ICD-10-CM | POA: Diagnosis not present

## 2019-11-08 DIAGNOSIS — Z85118 Personal history of other malignant neoplasm of bronchus and lung: Secondary | ICD-10-CM | POA: Insufficient documentation

## 2019-11-08 DIAGNOSIS — Z79891 Long term (current) use of opiate analgesic: Secondary | ICD-10-CM | POA: Insufficient documentation

## 2019-11-08 DIAGNOSIS — E860 Dehydration: Secondary | ICD-10-CM | POA: Diagnosis not present

## 2019-11-08 DIAGNOSIS — Z853 Personal history of malignant neoplasm of breast: Secondary | ICD-10-CM | POA: Insufficient documentation

## 2019-11-08 DIAGNOSIS — N183 Chronic kidney disease, stage 3 unspecified: Secondary | ICD-10-CM | POA: Insufficient documentation

## 2019-11-08 DIAGNOSIS — R1030 Lower abdominal pain, unspecified: Secondary | ICD-10-CM | POA: Diagnosis not present

## 2019-11-08 DIAGNOSIS — R109 Unspecified abdominal pain: Secondary | ICD-10-CM | POA: Diagnosis not present

## 2019-11-08 DIAGNOSIS — Z87891 Personal history of nicotine dependence: Secondary | ICD-10-CM | POA: Diagnosis not present

## 2019-11-08 DIAGNOSIS — R000 Tachycardia, unspecified: Secondary | ICD-10-CM | POA: Diagnosis not present

## 2019-11-08 LAB — COMPREHENSIVE METABOLIC PANEL
ALT: 17 U/L (ref 0–44)
AST: 34 U/L (ref 15–41)
Albumin: 3.4 g/dL — ABNORMAL LOW (ref 3.5–5.0)
Alkaline Phosphatase: 59 U/L (ref 38–126)
Anion gap: 11 (ref 5–15)
BUN: 15 mg/dL (ref 8–23)
CO2: 25 mmol/L (ref 22–32)
Calcium: 9.3 mg/dL (ref 8.9–10.3)
Chloride: 100 mmol/L (ref 98–111)
Creatinine, Ser: 1.01 mg/dL — ABNORMAL HIGH (ref 0.44–1.00)
GFR calc Af Amer: >60 mL/min (ref >60)
GFR calc non Af Amer: 53 mL/min — ABNORMAL LOW (ref >60)
Glucose, Bld: 117 mg/dL — ABNORMAL HIGH (ref 70–99)
Potassium: 4.5 mmol/L (ref 3.5–5.1)
Sodium: 136 mmol/L (ref 135–145)
Total Bilirubin: 0.9 mg/dL (ref 0.3–1.2)
Total Protein: 7.4 g/dL (ref 6.5–8.1)

## 2019-11-08 LAB — CBC
HCT: 47.6 % — ABNORMAL HIGH (ref 36.0–46.0)
Hemoglobin: 15.7 g/dL — ABNORMAL HIGH (ref 12.0–15.0)
MCH: 34.1 pg — ABNORMAL HIGH (ref 26.0–34.0)
MCHC: 33 g/dL (ref 30.0–36.0)
MCV: 103.3 fL — ABNORMAL HIGH (ref 80.0–100.0)
Platelets: 217 10*3/uL (ref 150–400)
RBC: 4.61 MIL/uL (ref 3.87–5.11)
RDW: 16.5 % — ABNORMAL HIGH (ref 11.5–15.5)
WBC: 6.4 10*3/uL (ref 4.0–10.5)
nRBC: 0 % (ref 0.0–0.2)

## 2019-11-08 LAB — LIPASE, BLOOD: Lipase: 17 U/L (ref 11–51)

## 2019-11-08 LAB — URINALYSIS, ROUTINE W REFLEX MICROSCOPIC
Bacteria, UA: NONE SEEN
Bilirubin Urine: NEGATIVE
Glucose, UA: NEGATIVE mg/dL
Hgb urine dipstick: NEGATIVE
Ketones, ur: 5 mg/dL — AB
Nitrite: NEGATIVE
Protein, ur: 30 mg/dL — AB
Specific Gravity, Urine: >1.046 — ABNORMAL HIGH (ref 1.005–1.030)
pH: 5 (ref 5.0–8.0)

## 2019-11-08 MED ORDER — MORPHINE SULFATE (PF) 4 MG/ML IV SOLN
4.0000 mg | Freq: Once | INTRAVENOUS | Status: AC
  Administered 2019-11-08: 4 mg via INTRAVENOUS
  Filled 2019-11-08: qty 1

## 2019-11-08 MED ORDER — SODIUM CHLORIDE 0.9 % IV BOLUS
1000.0000 mL | Freq: Once | INTRAVENOUS | Status: AC
  Administered 2019-11-08: 1000 mL via INTRAVENOUS

## 2019-11-08 MED ORDER — SODIUM CHLORIDE 0.9 % IV BOLUS
500.0000 mL | Freq: Once | INTRAVENOUS | Status: AC
  Administered 2019-11-08: 500 mL via INTRAVENOUS

## 2019-11-08 MED ORDER — DICYCLOMINE HCL 10 MG PO CAPS
10.0000 mg | ORAL_CAPSULE | Freq: Once | ORAL | Status: AC
  Administered 2019-11-08: 10 mg via ORAL
  Filled 2019-11-08: qty 1

## 2019-11-08 MED ORDER — IOHEXOL 300 MG/ML  SOLN
100.0000 mL | Freq: Once | INTRAMUSCULAR | Status: AC | PRN
  Administered 2019-11-08: 100 mL via INTRAVENOUS

## 2019-11-08 MED ORDER — ONDANSETRON HCL 4 MG/2ML IJ SOLN
4.0000 mg | Freq: Once | INTRAMUSCULAR | Status: AC
  Administered 2019-11-08: 4 mg via INTRAVENOUS
  Filled 2019-11-08: qty 2

## 2019-11-08 NOTE — ED Notes (Signed)
Pt informed of the need for a urine sample. Pt states that she will call out with the call bell when she is able to provide a sample

## 2019-11-08 NOTE — Telephone Encounter (Signed)
She called and left a message to call her.  Called back. She is complaining of having a uncomfortable feeling when she swallows water that started on Sunday. She has a dry throat at night. Denies sore throat and fever. Denies allergies. Eating with no problems. She has noticed that she has a few white spots on her tongue at times, but does not see any today. She gargles with vinegar/ baking soda after meals and at bedtime.  She is asking for suggestions.

## 2019-11-08 NOTE — Telephone Encounter (Signed)
Hard to see what is going on Could be esophagitis Try tums/ mylanta, if not better call back next Monday

## 2019-11-08 NOTE — ED Triage Notes (Signed)
Pt sts generalized abdominal pain described as a cramp every 15 minutes. Has not been drinking fluids. BM regular this morning. Keytruda infusions every 3 weeks for uterine ca.

## 2019-11-08 NOTE — Telephone Encounter (Signed)
Called and given below message. She verbalized understanding. 

## 2019-11-08 NOTE — ED Provider Notes (Signed)
[facility] DEPT Provider Note   CSN: [id number] Arrival date & time: 11/08/19  2052     History Chief Complaint  Patient presents with  . Abdominal Pain    Erin Esparza is a 79 y.o. female.  The history is provided by the patient, medical records and a relative. No language interpreter was used.  Abdominal Pain  Erin Esparza is a 79 y.o. female who presents to the Emergency Department complaining of abdominal pain.  She presents to the ED accompanied by her daughter for evaluation of lower abdominal pain that began earlier today. Pain is described as a punched/crampy feeling that began at 4pm.  Episodes come and go and last about a minute at a time.  She has associated nausea.  No fever, chest pain, sob, vomiting, diarrhea dysuria.  Sxs partially improve with home pain meds.  She has a hx/o metastatic uterine cancer and is currently being treated with [place].      Past Medical History:  Diagnosis Date  . Abdominal pain 10/17/2016  . Arthritis    "both feet" (10/17/2016)  . Asthma   . Breast cancer ([place]) 01/06/12   left breast invasive ductal ca,dcis,ER/PR=+,  . Breast mass in female   . Cataract   . Clotting disorder ([place])   . 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 Diastolic congestive heart failure ([place])   . DVT (deep venous thrombosis) ([place]) 1990's?   LLE  . Endometrial ca ([place]) dx'd 2020  . Family history of breast cancer   . Family history of prostate cancer   . Hypertension   . Insulin resistance   . Pulmonary embolism ([place]) ~ 2012  . Radiation 02/12/12 -03/12/12   left breast, total 50gy  . Right ankle pain   . Shortness of breath   . Venous stasis of lower extremity   . Wears dentures    upper  . Wears glasses   . Wears partial dentures    lower    Patient Active Problem List   Diagnosis Date Noted  . Stage I pressure ulcer of buttock 10/16/2019  . Venous ulcer ([place]) 09/09/2019  . Weight loss 09/06/2019  .  Hyperlipidemia 08/26/2019  . Eye abnormalities 08/25/2019  . Abnormal thyroid function test 08/25/2019  . Chronic knee pain 07/26/2019  . Deficiency anemia 03/22/2019  . Peripheral neuropathy due to chemotherapy ([place]) 01/12/2019  . Pancytopenia, acquired ([place]) 09/24/2018  . CKD (chronic kidney disease), stage III 09/24/2018  . 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 Genetic testing 09/10/2018  . Anemia due to antineoplastic chemotherapy 09/01/2018  . Goals of care, counseling/discussion 08/06/2018  . Family history of breast cancer   . Family history of prostate cancer   . Uterine cancer ([name]) 08/02/2018  . Low back pain without sciatica 01/15/2018  . Enteritis   . Prediabetes 02/27/2016  . Venous stasis of lower extremity   . History of left breast cancer 01/20/2012  . STASIS DERMATITIS, CHRONIC 10/25/2009  . DEEP VENOUS THROMBOPHLEBITIS, HX OF 07/28/2008  . Essential hypertension 09/24/2006  . ASTHMA, PERSISTENT 09/24/2006  . DEGENERATIVE JOINT DISEASE, KNEE 09/04/2006    Past Surgical History:  Procedure Laterality Date  . BREAST BIOPSY Left 11/03/11   10 o'clock=benign breast parenchyma  . BREAST LUMPECTOMY Left 01/06/12    Dr. Erroll Luna  . INGUINAL HERNIA REPAIR Right 03/22/2019   Procedure: RIGHT INGUINAL HERNIA REPAIR WITH MESH;  Surgeon: Erroll Luna, MD;  Location: [facility];  Service: General;  Laterality: Right;  tap block  . IR IMAGING GUIDED PORT INSERTION  08/06/2018     OB History   No obstetric history on file.     Family History  Problem Relation Age of Onset  . Heart disease Mother   . Heart attack Mother   . Breast cancer Sister 26  . Parkinson's disease Brother   . Prostate cancer Brother   . Pulmonary embolism Daughter   . Diabetes Daughter   . Pulmonary embolism Daughter   . Breast cancer Sister   . Other Brother        MVA  . Other Brother        MVA  . COPD Brother   . Breast cancer Niece        dx 71s    Social  History   Tobacco Use  . Smoking status: Former Smoker    Packs/day: 0.30    Years: 15.00    Pack years: 4.50    Types: Cigarettes  . Smokeless tobacco: Never Used  . Tobacco comment: "stopped in the 1990s"  Substance Use Topics  . Alcohol use: No  . Drug use: No    Home Medications Prior to Admission medications   Medication Sig Start Date End Date Taking? Authorizing Provider  acetaminophen (TYLENOL) 500 MG tablet Take 1 tablet (500 mg total) by mouth every 6 (six) hours as needed. Patient taking differently: Take 1,000 mg by mouth every 6 (six) hours as needed for moderate pain.  08/25/12   Coral Spikes, DO  Calcium Carbonate (CALTRATE 600 PO) Take 1 tablet by mouth 2 (two) times daily.     [provider]  enalapril (VASOTEC) 20 MG tablet TAKE 1 TABLET(20 MG) BY MOUTH TWICE DAILY Patient taking differently: Take 20 mg by mouth daily.  11/10/18   Lovenia Kim, MD  Fluticasone-Salmeterol (ADVAIR DISKUS) 100-50 MCG/DOSE AEPB USE 1 INHALATION BY MOUTH TWICE DAILY 07/20/19   [name], DO  furosemide (LASIX) 40 MG tablet TAKE 1 TABLET BY MOUTH DAILY AS NEEDED FOR LEG SWELLING Patient taking differently: Take 40 mg by mouth daily as needed (leg swelling).  11/04/18   Lovenia Kim, MD  lenvatinib 10 mg daily dose (LENVIMA) capsule Take 1 capsule (10 mg total) by mouth daily. 05/31/19   [name], MD  levothyroxine (SYNTHROID) 75 MCG tablet Take 1 tablet (75 mcg total) by mouth daily before breakfast. 10/04/19   [name], MD  lidocaine-prilocaine (EMLA) cream Apply to affected area once Patient taking differently: Apply 1 application topically daily as needed. Apply to affected area once 08/05/18   [name], MD  Multiple Vitamin (MULTIVITAMIN) tablet Take 1 tablet by mouth daily.    [provider]  Multiple Vitamins-Minerals (ICAPS AREDS 2 PO) Take 1 capsule by mouth 2 (two) times daily.    [provider]  ondansetron (ZOFRAN) 8 MG tablet Take 1  tablet (8 mg total) by mouth every 8 (eight) hours as needed. Start on the third day after chemotherapy. 08/05/18   [name], MD  oxyCODONE (OXY IR/ROXICODONE) 5 MG immediate release tablet Take 1 tablet (5 mg total) by mouth every 6 (six) hours as needed for severe pain. 09/06/19   [name], MD  polyethylene glycol (MIRALAX / GLYCOLAX) 17 g packet Take 17 g by mouth daily as needed for moderate constipation.    [provider]  prochlorperazine (COMPAZINE) 10 MG tablet Take 1 tablet (10 mg total)  by mouth every 6 (six) hours as needed (Nausea or vomiting). 10/25/19   [name], MD  rivaroxaban (XARELTO) 20 MG TABS tablet Take 1 tablet (20 mg total) by mouth daily with supper. 10/10/19   [name], DO  rosuvastatin (CRESTOR) 10 MG tablet Take 1 tablet (10 mg total) by mouth daily. Start with 5mg  for the first week. 09/30/19   [name], DO  Vitamin D, Cholecalciferol, 1000 units CAPS Take 1,000 Units by mouth daily.     [provider]    Allergies    Patient has no known allergies.  Review of Systems   Review of Systems  Gastrointestinal: Positive for abdominal pain.  All other systems reviewed and are negative.   Physical Exam Updated Vital Signs BP 103/69   Pulse 94   Temp 97.9 F (36.6 C) (Oral)   Resp 15   Ht 5\' 7"  (1.702 m)   Wt 94.8 kg   SpO2 97%   BMI 32.73 kg/m   Physical Exam Vitals and nursing note reviewed.  Constitutional:      Appearance: She is well-developed.  HENT:     Head: Normocephalic and atraumatic.  Cardiovascular:     Rate and Rhythm: Regular rhythm. Tachycardia present.     Heart sounds: No murmur.  Pulmonary:     Effort: Pulmonary effort is normal. No respiratory distress.     Breath sounds: Normal breath sounds.  Abdominal:     Palpations: Abdomen is soft.     Tenderness: There is no guarding or rebound.     Comments: Mild lower abdominal tenderness  Musculoskeletal:        General: No swelling or  tenderness.  Skin:    General: Skin is warm and dry.  Neurological:     Mental Status: She is alert and oriented to person, place, and time.  Psychiatric:        Behavior: Behavior normal.     ED Results / Procedures / Treatments   Labs (all labs ordered are listed, but only abnormal results are displayed) Labs Reviewed  COMPREHENSIVE METABOLIC PANEL - Abnormal; Notable for the following components:      Result Value   Glucose, Bld 117 (*)    Creatinine, Ser 1.01 (*)    Albumin 3.4 (*)    GFR calc non Af Amer 53 (*)    All other components within normal limits  CBC - Abnormal; Notable for the following components:   Hemoglobin 15.7 (*)    HCT 47.6 (*)    MCV 103.3 (*)    MCH 34.1 (*)    RDW 16.5 (*)    All other components within normal limits  URINALYSIS, ROUTINE W REFLEX MICROSCOPIC - Abnormal; Notable for the following components:   APPearance HAZY (*)    Specific Gravity, Urine >1.046 (*)    Ketones, ur 5 (*)    Protein, ur 30 (*)    Leukocytes,Ua TRACE (*)    All other components within normal limits  LIPASE, BLOOD    EKG EKG Interpretation  Date/Time:  Tuesday November 08 2019 22:02:40 EDT Ventricular Rate:  96 PR Interval:    QRS Duration: 116 QT Interval:  374 QTC Calculation: 473 R Axis:   -53 Text Interpretation: Sinus rhythm Incomplete left bundle branch block Left ventricular hypertrophy Anterior Q waves, possibly due to LVH Confirmed by Quintella Reichert [id number]) on 11/08/2019 11:22:32 PM   Radiology CT Abdomen Pelvis W Contrast  Result Date: 11/08/2019 CLINICAL DATA:  Generalized abdominal pain. EXAM: CT ABDOMEN AND PELVIS WITH CONTRAST TECHNIQUE: Multidetector CT imaging of the abdomen and pelvis was performed using the standard protocol following bolus administration of intravenous contrast. CONTRAST:  150mL OMNIPAQUE IOHEXOL 300 MG/ML  SOLN COMPARISON:  September 05, 2019 FINDINGS: Lower chest: No acute abnormality. Hepatobiliary: 5 mm and 6 mm foci of  parenchymal low attenuation is seen within the right lobe of the liver. No gallstones, gallbladder wall thickening, or biliary dilatation. Pancreas: Unremarkable. No pancreatic ductal dilatation or surrounding inflammatory changes. Spleen: Normal in size without focal abnormality. Adrenals/Urinary Tract: Adrenal glands are unremarkable. Kidneys are normal, without renal calculi or hydronephrosis. A stable 2.0 cm diameter simple cyst is seen within the posteromedial aspect of the mid left kidney. Bladder is unremarkable. Stomach/Bowel: Stomach is within normal limits. The appendix is not clearly identified. No evidence of bowel dilatation. Multiple mild-to-moderate severity thickened small bowel loops are seen throughout the abdomen. A mild amount of associated Peri intestinal inflammatory fat stranding is seen. Vascular/Lymphatic: There is mild to moderate severity calcification of the abdominal aorta. No enlarged abdominal or pelvic lymph nodes. Reproductive: The uterus is normal in appearance. A 4.8 cm x 3.3 cm area of heterogeneous increased attenuation is again seen within the right adnexa. A similar appearing 4.6 cm x 2.9 cm area of mildly increased attenuation is seen within the left adnexa. Other: A mild amount of stable subcutaneous inflammatory fat stranding is seen along the right inguinal region. There is a large amount of perihepatic fluid. A small amount of pelvic fluid is noted. Musculoskeletal: Multilevel degenerative changes seen throughout the lumbar spine. IMPRESSION: 1. Multiple mild-to-moderate severity thickened small bowel loops throughout the abdomen with associated peri intestinal inflammatory fat stranding. While this may represent an infectious or inflammatory enteritis, the presence of an underlying neoplastic process cannot be excluded. 2. Large amount of perihepatic fluid. 3. Stable bilateral adnexal masses, likely ovarian in origin. Correlation with pelvic ultrasound is recommended. 4.  Small foci of parenchymal low attenuation within the right lobe of the liver which may represent small cysts or hemangiomas. 5. Stable left renal cyst. 6. Aortic atherosclerosis. Aortic Atherosclerosis (ICD10-I70.0). Electronically Signed   By: Virgina Norfolk M.D.   On: 11/08/2019 22:45    Procedures Procedures (including critical care time)  Medications Ordered in ED Medications  sodium chloride 0.9 % bolus 500 mL (0 mLs Intravenous Stopped 11/08/19 2342)  dicyclomine (BENTYL) capsule 10 mg (10 mg Oral Given 11/08/19 2157)  morphine 4 MG/ML injection 4 mg (4 mg Intravenous Given 11/08/19 2155)  ondansetron (ZOFRAN) injection 4 mg (4 mg Intravenous Given 11/08/19 2155)  iohexol (OMNIPAQUE) 300 MG/ML solution 100 mL (100 mLs Intravenous Contrast Given 11/08/19 2223)  sodium chloride 0.9 % bolus 1,000 mL (0 mLs Intravenous Stopped 11/09/19 0102)  heparin lock flush 100 unit/mL (500 Units Intracatheter Given 11/09/19 0111)    ED Course  I have reviewed the triage vital signs and the nursing notes.  Pertinent labs & imaging results that were available during my care of the patient were reviewed by me and considered in my medical decision making (see chart for details).    MDM Rules/Calculators/A&P                     Pt w/ hx/o uterine cancer here for evaluation of crampy intermittent abdominal pain.  She was treated with pain medications in the ED without recurrent pain.  Labs c/w dehydration with hemoconcentration and concentrated urine.  UA  not c/w UTI.  She was treated with IVF hydration and felt improved on recheck.  CT scan with thickening of bowel loops - no evidence of obstruction.  Current clinical picture is not c/w acute infectious etiology.  D/w pt findings of study.  Given patient's symptoms are well controlled at this time plan to d/c home with outpatient follow up and close return precautions.    Final Clinical Impression(s) / ED Diagnoses Final diagnoses:  Lower abdominal pain   Dehydration    Rx / DC Orders ED Discharge Orders    None       Quintella Reichert, MD 11/09/19 [phone]

## 2019-11-09 ENCOUNTER — Ambulatory Visit (INDEPENDENT_AMBULATORY_CARE_PROVIDER_SITE_OTHER): Payer: Medicare Other | Admitting: Family Medicine

## 2019-11-09 ENCOUNTER — Telehealth: Payer: Self-pay

## 2019-11-09 ENCOUNTER — Ambulatory Visit: Payer: Medicare Other | Admitting: Family Medicine

## 2019-11-09 ENCOUNTER — Encounter: Payer: Self-pay | Admitting: Family Medicine

## 2019-11-09 DIAGNOSIS — L97909 Non-pressure chronic ulcer of unspecified part of unspecified lower leg with unspecified severity: Secondary | ICD-10-CM

## 2019-11-09 DIAGNOSIS — I83009 Varicose veins of unspecified lower extremity with ulcer of unspecified site: Secondary | ICD-10-CM | POA: Diagnosis not present

## 2019-11-09 DIAGNOSIS — R1030 Lower abdominal pain, unspecified: Secondary | ICD-10-CM | POA: Diagnosis not present

## 2019-11-09 MED ORDER — HEPARIN SOD (PORK) LOCK FLUSH 100 UNIT/ML IV SOLN
500.0000 [IU] | Freq: Once | INTRAVENOUS | Status: AC
  Administered 2019-11-09: 500 [IU]
  Filled 2019-11-09: qty 5

## 2019-11-09 NOTE — Telephone Encounter (Signed)
Called back and instructed to arrive tomorrow at 3 pm and Friday for 3:30 pm for IV fluids. She verbalized understanding.

## 2019-11-09 NOTE — Telephone Encounter (Signed)
-----   Message from [name], MD sent at 11/09/2019  7:42 AM EDT ----- Regarding: recent ER visit Can you call and ask if she needs IVF next few days? There is no room to add her on until her appt scheduled next week for me to see her I will review plan with pt and family then

## 2019-11-09 NOTE — Telephone Encounter (Signed)
Called and given below message. She verbalized understanding. She feels better today and got home about 1 am from the ER. She does not feel she needs IV fluids today. She would like IV fluids tomorrow and Friday. Told her I would set up appt and call her back. She verbalized understanding.

## 2019-11-09 NOTE — Assessment & Plan Note (Signed)
Appears much improved from her last visit on the third of this month.  Will rewrap today and advised patient to leave wrap on till her next visit with Dr. Enid Derry on June 18.  Advised patient to return to clinic if there are any signs of infection or bleeding.  Do not feel that wound care referral is necessary at this point.

## 2019-11-09 NOTE — Progress Notes (Signed)
    SUBJECTIVE:   CHIEF COMPLAINT / HPI:   Venous ulcer: Patient states that since she was seen on June 3, she has worn her leg wrapping.  She had to take it off and rewrap it 2 days ago, but other than that has left it alone.  She has sensation and movement in her foot.  No difficulty walking on it except if she is in a tight fitting boot such as Haematologist.  Patient feels like the ulcer is getting better and will only occasionally have episodes of sharp pain.  PERTINENT  PMH / PSH: Venous ulcer  OBJECTIVE:   BP 122/62   Pulse 100   Ht 5\' 7"  (1.702 m)   Wt 209 lb 6.4 oz (95 kg)   SpO2 96%   BMI 32.80 kg/m   General: Elderly female.  Appears of stated age. Extremities: Both legs have hyperpigmentation with thickened lichenified skin changes.  No ulcers seen on the right leg or foot.  Same ulceration on the medial left distal tibia from last visit appears to be improving.  Patient able to move all 10 toes freely.  Walks with use of cane.     ASSESSMENT/PLAN:   Venous ulcer ([place]) Appears much improved from her last visit on the third of this month.  Will rewrap today and advised patient to leave wrap on till her next visit with Dr. Enid Derry on June 18.  Advised patient to return to clinic if there are any signs of infection or bleeding.  Do not feel that wound care referral is necessary at this point.     Benay Pike, MD [place]

## 2019-11-09 NOTE — ED Notes (Signed)
Pt able to complete PO challenge with no issues

## 2019-11-09 NOTE — Patient Instructions (Addendum)
It was nice to meet you today,  Your wound is healing very well.  We are going to put on wrap today over your wound.  Please leave the wrap on until we see you next week.  You will need to follow-up in 1 more week.  If you should have to rewrap at any time while at home, please make sure that you put Vaseline over the wound so that what ever gauze or wrap you put on it does not stick to the wound.  Please return if you develop any worsening or signs of infection.  Have a great day,  Clemetine Marker, MD

## 2019-11-10 ENCOUNTER — Other Ambulatory Visit: Payer: Self-pay

## 2019-11-10 ENCOUNTER — Other Ambulatory Visit: Payer: Self-pay | Admitting: Hematology and Oncology

## 2019-11-10 ENCOUNTER — Inpatient Hospital Stay: Payer: Medicare Other | Attending: Gynecologic Oncology

## 2019-11-10 VITALS — BP 133/68 | HR 80 | Temp 98.3°F | Resp 18

## 2019-11-10 DIAGNOSIS — C55 Malignant neoplasm of uterus, part unspecified: Secondary | ICD-10-CM

## 2019-11-10 DIAGNOSIS — R946 Abnormal results of thyroid function studies: Secondary | ICD-10-CM | POA: Insufficient documentation

## 2019-11-10 DIAGNOSIS — Z5112 Encounter for antineoplastic immunotherapy: Secondary | ICD-10-CM | POA: Insufficient documentation

## 2019-11-10 DIAGNOSIS — R197 Diarrhea, unspecified: Secondary | ICD-10-CM | POA: Diagnosis not present

## 2019-11-10 DIAGNOSIS — C541 Malignant neoplasm of endometrium: Secondary | ICD-10-CM | POA: Diagnosis present

## 2019-11-10 MED ORDER — SODIUM CHLORIDE 0.9% FLUSH
10.0000 mL | Freq: Once | INTRAVENOUS | Status: AC
  Administered 2019-11-10: 10 mL
  Filled 2019-11-10: qty 10

## 2019-11-10 MED ORDER — HEPARIN SOD (PORK) LOCK FLUSH 100 UNIT/ML IV SOLN
500.0000 [IU] | Freq: Once | INTRAVENOUS | Status: AC
  Administered 2019-11-10: 500 [IU]
  Filled 2019-11-10: qty 5

## 2019-11-10 MED ORDER — SODIUM CHLORIDE 0.9 % IV SOLN
Freq: Once | INTRAVENOUS | Status: AC
  Filled 2019-11-10: qty 250

## 2019-11-11 ENCOUNTER — Inpatient Hospital Stay: Payer: Medicare Other

## 2019-11-11 ENCOUNTER — Other Ambulatory Visit: Payer: Self-pay

## 2019-11-11 VITALS — BP 128/72 | HR 83 | Temp 98.2°F | Resp 18

## 2019-11-11 DIAGNOSIS — R946 Abnormal results of thyroid function studies: Secondary | ICD-10-CM | POA: Diagnosis not present

## 2019-11-11 DIAGNOSIS — C55 Malignant neoplasm of uterus, part unspecified: Secondary | ICD-10-CM

## 2019-11-11 DIAGNOSIS — R197 Diarrhea, unspecified: Secondary | ICD-10-CM | POA: Diagnosis not present

## 2019-11-11 DIAGNOSIS — Z5112 Encounter for antineoplastic immunotherapy: Secondary | ICD-10-CM | POA: Diagnosis not present

## 2019-11-11 MED ORDER — HEPARIN SOD (PORK) LOCK FLUSH 100 UNIT/ML IV SOLN
500.0000 [IU] | Freq: Once | INTRAVENOUS | Status: AC
  Administered 2019-11-11: 500 [IU]
  Filled 2019-11-11: qty 5

## 2019-11-11 MED ORDER — SODIUM CHLORIDE 0.9% FLUSH
10.0000 mL | Freq: Once | INTRAVENOUS | Status: AC
  Administered 2019-11-11: 10 mL
  Filled 2019-11-11: qty 10

## 2019-11-11 MED ORDER — SODIUM CHLORIDE 0.9 % IV SOLN
Freq: Once | INTRAVENOUS | Status: AC
  Filled 2019-11-11: qty 250

## 2019-11-11 NOTE — Patient Instructions (Signed)

## 2019-11-15 ENCOUNTER — Other Ambulatory Visit: Payer: Self-pay | Admitting: Hematology and Oncology

## 2019-11-15 ENCOUNTER — Inpatient Hospital Stay: Payer: Medicare Other

## 2019-11-15 ENCOUNTER — Encounter: Payer: Self-pay | Admitting: Hematology and Oncology

## 2019-11-15 ENCOUNTER — Inpatient Hospital Stay (HOSPITAL_BASED_OUTPATIENT_CLINIC_OR_DEPARTMENT_OTHER): Payer: Medicare Other | Admitting: Hematology and Oncology

## 2019-11-15 ENCOUNTER — Telehealth: Payer: Self-pay

## 2019-11-15 ENCOUNTER — Other Ambulatory Visit: Payer: Self-pay

## 2019-11-15 DIAGNOSIS — T148XXD Other injury of unspecified body region, subsequent encounter: Secondary | ICD-10-CM | POA: Diagnosis not present

## 2019-11-15 DIAGNOSIS — R197 Diarrhea, unspecified: Secondary | ICD-10-CM | POA: Insufficient documentation

## 2019-11-15 DIAGNOSIS — C55 Malignant neoplasm of uterus, part unspecified: Secondary | ICD-10-CM | POA: Diagnosis not present

## 2019-11-15 DIAGNOSIS — R946 Abnormal results of thyroid function studies: Secondary | ICD-10-CM | POA: Diagnosis not present

## 2019-11-15 DIAGNOSIS — Z5112 Encounter for antineoplastic immunotherapy: Secondary | ICD-10-CM | POA: Diagnosis not present

## 2019-11-15 LAB — CMP (CANCER CENTER ONLY)
ALT: 13 U/L (ref 0–44)
AST: 30 U/L (ref 15–41)
Albumin: 3.1 g/dL — ABNORMAL LOW (ref 3.5–5.0)
Alkaline Phosphatase: 65 U/L (ref 38–126)
Anion gap: 11 (ref 5–15)
BUN: 9 mg/dL (ref 8–23)
CO2: 22 mmol/L (ref 22–32)
Calcium: 9 mg/dL (ref 8.9–10.3)
Chloride: 103 mmol/L (ref 98–111)
Creatinine: 0.9 mg/dL (ref 0.44–1.00)
GFR, Est AFR Am: >60 mL/min (ref >60)
GFR, Estimated: >60 mL/min (ref >60)
Glucose, Bld: 86 mg/dL (ref 70–99)
Potassium: 4.1 mmol/L (ref 3.5–5.1)
Sodium: 136 mmol/L (ref 135–145)
Total Bilirubin: 0.6 mg/dL (ref 0.3–1.2)
Total Protein: 7.1 g/dL (ref 6.5–8.1)

## 2019-11-15 LAB — CBC WITH DIFFERENTIAL (CANCER CENTER ONLY)
Abs Immature Granulocytes: 0.01 10*3/uL (ref 0.00–0.07)
Basophils Absolute: 0 10*3/uL (ref 0.0–0.1)
Basophils Relative: 1 %
Eosinophils Absolute: 0.1 10*3/uL (ref 0.0–0.5)
Eosinophils Relative: 3 %
HCT: 37.5 % (ref 36.0–46.0)
Hemoglobin: 12.6 g/dL (ref 12.0–15.0)
Immature Granulocytes: 0 %
Lymphocytes Relative: 21 %
Lymphs Abs: 1 10*3/uL (ref 0.7–4.0)
MCH: 33.8 pg (ref 26.0–34.0)
MCHC: 33.6 g/dL (ref 30.0–36.0)
MCV: 100.5 fL — ABNORMAL HIGH (ref 80.0–100.0)
Monocytes Absolute: 0.8 10*3/uL (ref 0.1–1.0)
Monocytes Relative: 18 %
Neutro Abs: 2.7 10*3/uL (ref 1.7–7.7)
Neutrophils Relative %: 57 %
Platelet Count: 154 10*3/uL (ref 150–400)
RBC: 3.73 MIL/uL — ABNORMAL LOW (ref 3.87–5.11)
RDW: 16.4 % — ABNORMAL HIGH (ref 11.5–15.5)
WBC Count: 4.7 10*3/uL (ref 4.0–10.5)
nRBC: 0 % (ref 0.0–0.2)

## 2019-11-15 LAB — TOTAL PROTEIN, URINE DIPSTICK: Protein, ur: 30 mg/dL — AB

## 2019-11-15 LAB — TSH: TSH: 28.861 u[IU]/mL — ABNORMAL HIGH (ref 0.350–4.500)

## 2019-11-15 MED ORDER — LEVOTHYROXINE SODIUM 100 MCG PO TABS: 100.0000 ug | ORAL_TABLET | Freq: Every day | ORAL | 11 refills | Status: DC

## 2019-11-15 MED ORDER — HEPARIN SOD (PORK) LOCK FLUSH 100 UNIT/ML IV SOLN
500.0000 [IU] | Freq: Once | INTRAVENOUS | Status: AC | PRN
  Administered 2019-11-15: 500 [IU]
  Filled 2019-11-15: qty 5

## 2019-11-15 MED ORDER — SODIUM CHLORIDE 0.9% FLUSH
10.0000 mL | INTRAVENOUS | Status: DC | PRN
  Administered 2019-11-15: 10 mL
  Filled 2019-11-15: qty 10

## 2019-11-15 MED ORDER — OXYCODONE HCL 5 MG PO TABS: 5.0000 mg | ORAL_TABLET | Freq: Four times a day (QID) | ORAL | 0 refills | Status: DC | PRN

## 2019-11-15 MED ORDER — SODIUM CHLORIDE 0.9 % IV SOLN
200.0000 mg | Freq: Once | INTRAVENOUS | Status: AC
  Administered 2019-11-15: 200 mg via INTRAVENOUS
  Filled 2019-11-15: qty 8

## 2019-11-15 MED ORDER — SODIUM CHLORIDE 0.9% FLUSH
10.0000 mL | Freq: Once | INTRAVENOUS | Status: AC
  Administered 2019-11-15: 10 mL
  Filled 2019-11-15: qty 10

## 2019-11-15 MED ORDER — SODIUM CHLORIDE 0.9 % IV SOLN
Freq: Once | INTRAVENOUS | Status: AC
  Filled 2019-11-15: qty 250

## 2019-11-15 NOTE — Assessment & Plan Note (Signed)
The cause of the diarrhea is multifactorial It could be due to lactose intolerance, infectious etiology or 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 of immunotherapy We will continue aggressive supportive care I recommend avoid milk products if possible She will continue Imodium and aggressive hydration

## 2019-11-15 NOTE — Assessment & Plan Note (Signed)
I reviewed multiple imaging studies with the patient and her granddaughter Overall, she has stable disease control However, she developed some diarrhea recently that could be infectious in etiology versus 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 of immunotherapy We will continue pembrolizumab today but I told her to hold Lenvima for 1 week I will call her next week to see how she feels If she feels better, will resume Lenvima at every other day However, if she does not feel better, we will hold pembrolizumab in her next visit In the meantime, we will continue aggressive supportive care

## 2019-11-15 NOTE — Telephone Encounter (Signed)
Called and given below message. She verbalized understanding. 

## 2019-11-15 NOTE — Progress Notes (Signed)
Okay to proceed with treatment without completed urine sample.

## 2019-11-15 NOTE — Patient Instructions (Signed)
[facility] Discharge Instructions for Patients Receiving Chemotherapy  Today you received the following chemotherapy agents Pembrolizumab (KEYTRUDA).  To help prevent nausea and vomiting after your treatment, we encourage you to take your nausea medication as prescribed.   If you develop nausea and vomiting that is not controlled by your nausea medication, call the clinic.   BELOW ARE SYMPTOMS THAT SHOULD BE REPORTED IMMEDIATELY:  *FEVER GREATER THAN 100.5 F  *CHILLS WITH OR WITHOUT FEVER  NAUSEA AND VOMITING THAT IS NOT CONTROLLED WITH YOUR NAUSEA MEDICATION  *UNUSUAL SHORTNESS OF BREATH  *UNUSUAL BRUISING OR BLEEDING  TENDERNESS IN MOUTH AND THROAT WITH OR WITHOUT PRESENCE OF ULCERS  *URINARY PROBLEMS  *BOWEL PROBLEMS  UNUSUAL RASH Items with * indicate a potential emergency and should be followed up as soon as possible.  Feel free to call the clinic should you have any questions or concerns. The clinic phone number is [phone].  Please show the CHEMO ALERT CARD at check-in to the Emergency Department and triage nurse.   

## 2019-11-15 NOTE — Assessment & Plan Note (Signed)
Her TSH has improved since introduction of Synthroid We will continue to adjust the dose as needed  

## 2019-11-15 NOTE — Progress Notes (Signed)
[facility] OFFICE PROGRESS NOTE  Patient Care Team: [name], DO as PCP - General (Family Medicine) [name], MD as Consulting Physician (Ophthalmology) [name], DPM as Consulting Physician (Podiatry) [name], [name], MD as Referring Physician (Optometry) [name], MD (Inactive) as Consulting Physician (Orthopedic Surgery) [name], RN as Oncology Nurse Navigator (Oncology)  ASSESSMENT & PLAN:  Uterine cancer [facility]) I reviewed multiple imaging studies with the patient and her granddaughter Overall, she has stable disease control However, she developed some diarrhea recently that could be infectious in etiology versus 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 of immunotherapy We will continue pembrolizumab today but I told her to hold Lenvima for 1 week I will call her next week to see how she feels If she feels better, will resume Lenvima at every other day However, if she does not feel better, we will hold pembrolizumab in her next visit In the meantime, we will continue aggressive supportive care   Abnormal thyroid function test Her TSH has improved since introduction of Synthroid We will continue to adjust the dose as needed   Diarrhea The cause of the diarrhea is multifactorial It could be due to lactose intolerance, infectious etiology or 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 of immunotherapy We will continue aggressive supportive care I recommend avoid milk products if possible She will continue Imodium and aggressive hydration  Delayed wound healing I recommend referral to wound care   Orders Placed This Encounter  Procedures  . AMB referral to wound care center    Referral Priority:   Routine    Referral Type:   Consultation    Number of Visits Requested:   1    All questions were answered. The patient knows to call the clinic with any problems, questions or concerns. The total time spent in the appointment was 30 minutes encounter with  patients including review of chart and various tests results, discussions about plan of care and coordination of care plan   [name], MD 11/15/2019 12:38 PM  INTERVAL HISTORY: Please see below for problem oriented charting. 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 Ultrasound irregular hypoechoic mass 7 x 6 x 5 mm   01/06/2012 Initial Biopsy   Initial biopsy was benign . 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 Cervix, biopsy - HIGH GRADE CARCINOMA. - SEE COMMENT. 2. Endocervix, curettage - HIGH GRADE CARCINOMA. - SEE COMMENT. 3. Endometrium, biopsy - HIGH GRADE CARCINOMA. - SEE COMMENT. Microscopic Comment 1. - 3. The  carcinoma in the three specimens is morphologically similar and has features suggestive of papillary serous carcinoma.   07/26/2018 Tumor Marker   Patient's tumor was tested for the following markers: CA-125 Results of the tumor marker test revealed 835   07/28/2018 Imaging   1. Several (at least 10) solid pulmonary nodules scattered throughout both lungs are new since 2012 chest CT, likely representing pulmonary metastases, largest 6 mm, below PET resolution. 2. Redemonstration of hypodense 2.5 cm segment 7 right liver lobe mass, indeterminate, suspicious for liver metastasis. 3. 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 4. Three-vessel coronary atherosclerosis.  Aortic Atherosclerosis (ICD10-I70.0).   08/03/2018 Cancer Staging   Staging form: Corpus Uteri - Carcinoma and Carcinosarcoma, AJCC 8th Edition - Clinical: FIGO Stage IVB (cT3b, cN2, cM1) - Signed by [name], MD on 08/03/2018   08/05/2018 Tumor Marker   Patient's tumor was tested for the following markers: CA-125 Results of the tumor marker test revealed 901   08/06/2018 Procedure   Placement of single lumen port a cath via right internal jugular vein. The catheter tip lies at the cavo-atrial junction. A power injectable port a cath was placed and is ready for immediate use.    08/11/2018 - 01/12/2019 Chemotherapy   The patient had carboplatin and taxol x 8 cycles   09/01/2018 Tumor Marker   Patient's tumor was tested for the following markers: CA-125 Results of the tumor marker test revealed 406   09/09/2018 Genetic Testing   The Common Hereditary Cancers Panel offered by [facility] includes sequencing and/or deletion duplication testing of the following 48 genes: APC, ATM, AXIN2, BARD1, BMPR1A, BRCA1, BRCA2, BRIP1, CDH1, CDKN2A (p14ARF), CDKN2A (p16INK4a), CKD4, CHEK2, CTNNA1, DICER1, EPCAM (Deletion/duplication testing only), GREM1 (promoter region deletion/duplication testing only), KIT, MEN1, MLH1, MSH2,  MSH3, MSH6, MUTYH, NBN, NF1, NHTL1, PALB2, PDGFRA, PMS2, POLD1, POLE, PTEN, RAD50, RAD51C, RAD51D, RNF43, SDHB, SDHC, SDHD, SMAD4, SMARCA4. STK11, TP53, TSC1, TSC2, and VHL.  The following genes were evaluated for sequence changes only: SDHA and HOXB13 c.251G>A variant only.  Results: Negative, no pathogenic variants identified. The report date is 09/09/2018.     09/22/2018 Tumor Marker   Patient's tumor was tested for the following markers: CA-125 Results of the tumor marker test revealed 110   10/12/2018 Tumor Marker   Patient's tumor was tested for the following markers: CA-125 Results of the tumor marker test revealed 55.3   10/12/2018 Imaging   1. Mild decrease in pulmonary metastases, liver metastases, and metastatic abdominal and pelvic lymphadenopathy. 2. Decreased size of complex cystic lesion in left presacral region. 3. No new or progressive metastatic disease identified. 4. Stable small right inguinal hernia containing small portion of urinary bladder.   11/03/2018 Tumor Marker   Patient's tumor was tested for the following markers: CA-125 Results of the tumor marker test revealed 34.3    Genetic Testing   Patient has genetic testing done for HER2. Results revealed patient has the following: HER2 - negative from pathology from 07/26/18.   12/20/2018 Imaging   1. Possible new omental nodule. 2. Improving abdominal and pelvic retroperitoneal adenopathy. 3. Stable pulmonary and hepatic metastatic disease. 4. Left perirectal/presacral fluid collection has  decreased in size. 5. Right inguinal hernia contains a small portion of the bladder, as before. 6. Aortic atherosclerosis (ICD10-170.0). Coronary artery calcification   12/21/2018 Tumor Marker   Patient's tumor was tested for the following markers: CA-125 Results of the tumor marker test revealed 15.6   01/15/2019 Imaging   Ct abdomen and pelvis 1. Small hiatal hernia with fluid in the distal esophagus, which may represent  reflux or esophagitis. No other acute findings. 2. Right inguinal hernia contains anterolateral aspect of the urinary bladder, unchanged in appearance from prior. Mild edema within the hernia sac is similar. 3. Unchanged small low-density lesions in the liver. 4. Unchanged retroperitoneal and bilateral iliac lymph nodes. Small ventral abdominal omental nodule tentatively identified and unchanged from prior.   Aortic Atherosclerosis (ICD10-I70.0).   02/02/2019 Tumor Marker   Patient's tumor was tested for the following markers: CA-125 Results of the tumor marker test revealed 7.8   03/22/2019 Surgery   Preoperative diagnosis: History of incarcerated right inguinal hernia   Postop diagnosis: Incarcerated right inguinal hernia with preperitoneal fat without strangulation or obstruction   Procedure: Repair of right inguinal hernia with ultra Pro hernia system mesh   Surgeon: Erroll Luna, MD   03/31/2019 Tumor Marker   Patient's tumor was tested for the following markers: CA-`125 Results of the tumor marker test revealed 24.4.   05/25/2019 Tumor Marker   Patient's tumor was tested for the following markers: CA-125 Results of the tumor marker test revealed 287   05/25/2019 Imaging   New solid bilateral adnexal masses, consistent with metastatic disease.   New peritoneal soft tissue nodules in the anterior right pelvis, suspicious for peritoneal carcinomatosis. No evidence of ascites.   No significant change in shotty sub-cm abdominal retroperitoneal and bilateral iliac lymph nodes.   06/09/2019 -  Chemotherapy   The patient had pembrolizumab (KEYTRUDA) 200 mg in sodium chloride 0.9 % 50 mL chemo infusion, 200 mg, Intravenous, Once, 8 of 8 cycles Administration: 200 mg (06/09/2019), 200 mg (07/26/2019), 200 mg (08/16/2019), 200 mg (10/04/2019), 200 mg (06/30/2019), 200 mg (09/06/2019), 200 mg (10/25/2019)  for chemotherapy treatment.    06/09/2019 Tumor Marker   Patient's tumor was tested for  the following markers: CA-125 Results of the tumor marker test revealed 468   07/26/2019 Tumor Marker   Patient's tumor was tested for the following markers: CA-125 Results of the tumor marker test revealed 282   08/17/2019 Tumor Marker   Patient's tumor was tested for the following markers: CA-125 Results of the tumor marker test revealed 190.   09/05/2019 Imaging   1. Subtle nodularity along the transverse mesocolon measuring 11 mm is concerning for peritoneal carcinomatosis. Also associated with perihepatic ascites and enlarging juxta diaphragmatic nodal disease.  Attention on follow-up. 2. Bilateral adnexal masses are similar in size to the previous exam. 3. Left para-aortic lymph node is slightly increased in size from the previous exam, slight interval enlargement is suspicious based on location for nodal involvement. Scattered subcentimeter lymph nodes throughout the left and right pelvis are similar to the previous exam. 4. Calcified coronary artery disease. 5. Low-density lesions in the liver are unchanged. 6. Persistent endometrial thickening not well assessed on CT. 7. Stranding in the right inguinal region tracking towards the abdominal wall likely related to prior inguinal hernia repair is unchanged. Area of discrete nodularity in this location is no longer seen.   Aortic Atherosclerosis (ICD10-I70.0).       09/07/2019 Tumor Marker   Patient's tumor was tested  for the following markers: CA-125 Results of the tumor marker test revealed 214   11/08/2019 Imaging   1. Multiple mild-to-moderate severity thickened small bowel loops throughout the abdomen with associated peri intestinal inflammatory fat stranding. While this may represent an infectious or inflammatory enteritis, the presence of an underlying neoplastic process cannot be excluded. 2. Large amount of perihepatic fluid. 3. Stable bilateral adnexal masses, likely ovarian in origin. Correlation with pelvic ultrasound is  recommended. 4. Small foci of parenchymal low attenuation within the right lobe of the liver which may represent small cysts or hemangiomas. 5. Stable left renal cyst. 6. Aortic atherosclerosis.   Aortic Atherosclerosis (ICD10-I70.0).       REVIEW OF SYSTEMS:   Constitutional: Denies fevers, chills or abnormal weight loss Eyes: Denies blurriness of vision Ears, nose, mouth, throat, and face: Denies mucositis or sore throat Respiratory: Denies cough, dyspnea or wheezes Cardiovascular: Denies palpitation, chest discomfort  Gastrointestinal:  Denies nausea, heartburn or change in bowel habits Skin: Denies abnormal skin rashes Lymphatics: Denies new lymphadenopathy or easy bruising Neurological:Denies numbness, tingling or new weaknesses Behavioral/Psych: Mood is stable, no new changes  All other systems were reviewed with the patient and are negative.  I have reviewed the past medical history, past surgical history, social history and family history with the patient and they are unchanged from previous note.  ALLERGIES:  has No Known Allergies.  MEDICATIONS:  Current Outpatient Medications  Medication Sig Dispense Refill  . acetaminophen (TYLENOL) 500 MG tablet Take 1 tablet (500 mg total) by mouth every 6 (six) hours as needed. (Patient taking differently: Take 1,000 mg by mouth every 6 (six) hours as needed for moderate pain. ) 30 tablet 3  . Calcium Carbonate (CALTRATE 600 PO) Take 1 tablet by mouth 2 (two) times daily.     . enalapril (VASOTEC) 20 MG tablet TAKE 1 TABLET(20 MG) BY MOUTH TWICE DAILY (Patient taking differently: Take 20 mg by mouth daily. ) 180 tablet 2  . Fluticasone-Salmeterol (ADVAIR DISKUS) 100-50 MCG/DOSE AEPB USE 1 INHALATION BY MOUTH TWICE DAILY 60 each 2  . furosemide (LASIX) 40 MG tablet TAKE 1 TABLET BY MOUTH DAILY AS NEEDED FOR LEG SWELLING (Patient taking differently: Take 40 mg by mouth daily as needed (leg swelling). ) 90 tablet 2  . lenvatinib 10 mg  daily dose (LENVIMA) capsule Take 1 capsule (10 mg total) by mouth daily. 30 capsule 11  . levothyroxine (SYNTHROID) 75 MCG tablet Take 1 tablet (75 mcg total) by mouth daily before breakfast. 30 tablet 11  . lidocaine-prilocaine (EMLA) cream Apply to affected area once (Patient taking differently: Apply 1 application topically daily as needed. Apply to affected area once) 30 g 3  . Multiple Vitamin (MULTIVITAMIN) tablet Take 1 tablet by mouth daily.    . Multiple Vitamins-Minerals (ICAPS AREDS 2 PO) Take 1 capsule by mouth 2 (two) times daily.    . ondansetron (ZOFRAN) 8 MG tablet Take 1 tablet (8 mg total) by mouth every 8 (eight) hours as needed. Start on the third day after chemotherapy. 30 tablet 1  . oxyCODONE (OXY IR/ROXICODONE) 5 MG immediate release tablet Take 1 tablet (5 mg total) by mouth every 6 (six) hours as needed for severe pain. 60 tablet 0  . polyethylene glycol (MIRALAX / GLYCOLAX) 17 g packet Take 17 g by mouth daily as needed for moderate constipation.    . prochlorperazine (COMPAZINE) 10 MG tablet Take 1 tablet (10 mg total) by mouth every 6 (six) hours as  needed (Nausea or vomiting). 60 tablet 1  . rivaroxaban (XARELTO) 20 MG TABS tablet Take 1 tablet (20 mg total) by mouth daily with supper. 90 tablet 3  . rosuvastatin (CRESTOR) 10 MG tablet Take 1 tablet (10 mg total) by mouth daily. Start with 1m for the first week. 90 tablet 0  . Vitamin D, Cholecalciferol, 1000 units CAPS Take 1,000 Units by mouth daily.      No current facility-administered medications for this visit.   Facility-Administered Medications Ordered in Other Visits  Medication Dose Route Frequency Provider Last Rate Last Admin  . heparin lock flush 100 unit/mL  500 Units Intracatheter Once PRN GAlvy Bimler Kylon Philbrook, MD      . pembrolizumab (KEYTRUDA) 200 mg in sodium chloride 0.9 % 50 mL chemo infusion  200 mg Intravenous Once GAlvy Bimler Kiala Faraj, MD      . sodium chloride flush (NS) 0.9 % injection 10 mL  10 mL  Intracatheter PRN GAlvy Bimler Taevion Sikora, MD        PHYSICAL EXAMINATION: ECOG PERFORMANCE STATUS: 2 - Symptomatic, <50% confined to bed  Vitals:   11/15/19 1143  BP: 138/76  Pulse: 97  Resp: 18  Temp: 98.2 F (36.8 C)  SpO2: 100%   Filed Weights   11/15/19 1143  Weight: 209 lb (94.8 kg)    GENERAL:alert, no distress and comfortable HEART: She has moderate bilateral lower extremity edema NEURO: alert & oriented x 3 with fluent speech, no focal motor/sensory deficits  LABORATORY DATA:  I have reviewed the data as listed    Component Value Date/Time   NA 136 11/15/2019 1102   NA 142 12/25/2014 1122   K 4.1 11/15/2019 1102   K 4.1 12/25/2014 1122   CL 103 11/15/2019 1102   CL 102 10/04/2012 1159   CO2 22 11/15/2019 1102   CO2 29 12/25/2014 1122   GLUCOSE 86 11/15/2019 1102   GLUCOSE 99 12/25/2014 1122   GLUCOSE 116 (H) 10/04/2012 1159   BUN 9 11/15/2019 1102   BUN 13.7 12/25/2014 1122   CREATININE 0.90 11/15/2019 1102   CREATININE 0.96 (H) 02/27/2016 1530   CREATININE 0.9 12/25/2014 1122   CALCIUM 9.0 11/15/2019 1102   CALCIUM 10.1 12/25/2014 1122   PROT 7.1 11/15/2019 1102   PROT 7.8 12/25/2014 1122   ALBUMIN 3.1 (L) 11/15/2019 1102   ALBUMIN 3.4 (L) 12/25/2014 1122   AST 30 11/15/2019 1102   AST 21 12/25/2014 1122   ALT 13 11/15/2019 1102   ALT 17 12/25/2014 1122   ALKPHOS 65 11/15/2019 1102   ALKPHOS 99 12/25/2014 1122   BILITOT 0.6 11/15/2019 1102   BILITOT 0.41 12/25/2014 1122   GFRNONAA >60 11/15/2019 1102   GFRNONAA 58 (L) 02/27/2016 1530   GFRAA >60 11/15/2019 1102   GFRAA 66 02/27/2016 1530    No results found for: SPEP, UPEP  Lab Results  Component Value Date   WBC 4.7 11/15/2019   NEUTROABS 2.7 11/15/2019   HGB 12.6 11/15/2019   HCT 37.5 11/15/2019   MCV 100.5 (H) 11/15/2019   PLT 154 11/15/2019      Chemistry      Component Value Date/Time   NA 136 11/15/2019 1102   NA 142 12/25/2014 1122   K 4.1 11/15/2019 1102   K 4.1 12/25/2014 1122    CL 103 11/15/2019 1102   CL 102 10/04/2012 1159   CO2 22 11/15/2019 1102   CO2 29 12/25/2014 1122   BUN 9 11/15/2019 1102   BUN 13.7 12/25/2014 1122  CREATININE 0.90 11/15/2019 1102   CREATININE 0.96 (H) 02/27/2016 1530   CREATININE 0.9 12/25/2014 1122      Component Value Date/Time   CALCIUM 9.0 11/15/2019 1102   CALCIUM 10.1 12/25/2014 1122   ALKPHOS 65 11/15/2019 1102   ALKPHOS 99 12/25/2014 1122   AST 30 11/15/2019 1102   AST 21 12/25/2014 1122   ALT 13 11/15/2019 1102   ALT 17 12/25/2014 1122   BILITOT 0.6 11/15/2019 1102   BILITOT 0.41 12/25/2014 1122       RADIOGRAPHIC STUDIES: I reviewed multiple CT imaging with the patient and family I have personally reviewed the radiological images as listed and agreed with the findings in the report. CT Abdomen Pelvis W Contrast  Result Date: 11/08/2019 CLINICAL DATA:  Generalized abdominal pain. EXAM: CT ABDOMEN AND PELVIS WITH CONTRAST TECHNIQUE: Multidetector CT imaging of the abdomen and pelvis was performed using the standard protocol following bolus administration of intravenous contrast. CONTRAST:  124m OMNIPAQUE IOHEXOL 300 MG/ML  SOLN COMPARISON:  September 05, 2019 FINDINGS: Lower chest: No acute abnormality. Hepatobiliary: 5 mm and 6 mm foci of parenchymal low attenuation is seen within the right lobe of the liver. No gallstones, gallbladder wall thickening, or biliary dilatation. Pancreas: Unremarkable. No pancreatic ductal dilatation or surrounding inflammatory changes. Spleen: Normal in size without focal abnormality. Adrenals/Urinary Tract: Adrenal glands are unremarkable. Kidneys are normal, without renal calculi or hydronephrosis. A stable 2.0 cm diameter simple cyst is seen within the posteromedial aspect of the mid left kidney. Bladder is unremarkable. Stomach/Bowel: Stomach is within normal limits. The appendix is not clearly identified. No evidence of bowel dilatation. Multiple mild-to-moderate severity thickened small  bowel loops are seen throughout the abdomen. A mild amount of associated Peri intestinal inflammatory fat stranding is seen. Vascular/Lymphatic: There is mild to moderate severity calcification of the abdominal aorta. No enlarged abdominal or pelvic lymph nodes. Reproductive: The uterus is normal in appearance. A 4.8 cm x 3.3 cm area of heterogeneous increased attenuation is again seen within the right adnexa. A similar appearing 4.6 cm x 2.9 cm area of mildly increased attenuation is seen within the left adnexa. Other: A mild amount of stable subcutaneous inflammatory fat stranding is seen along the right inguinal region. There is a large amount of perihepatic fluid. A small amount of pelvic fluid is noted. Musculoskeletal: Multilevel degenerative changes seen throughout the lumbar spine. IMPRESSION: 1. Multiple mild-to-moderate severity thickened small bowel loops throughout the abdomen with associated peri intestinal inflammatory fat stranding. While this may represent an infectious or inflammatory enteritis, the presence of an underlying neoplastic process cannot be excluded. 2. Large amount of perihepatic fluid. 3. Stable bilateral adnexal masses, likely ovarian in origin. Correlation with pelvic ultrasound is recommended. 4. Small foci of parenchymal low attenuation within the right lobe of the liver which may represent small cysts or hemangiomas. 5. Stable left renal cyst. 6. Aortic atherosclerosis. Aortic Atherosclerosis (ICD10-I70.0). Electronically Signed   By: TVirgina NorfolkM.D.   On: 11/08/2019 22:45

## 2019-11-15 NOTE — Assessment & Plan Note (Signed)
I recommend referral to wound care

## 2019-11-15 NOTE — Telephone Encounter (Signed)
-----   Message from [name], MD sent at 11/15/2019  3:29 PM EDT ----- Regarding: pls let her know TSH is high, I increased synthroid to 100 mcg, sent to Pharmacy

## 2019-11-16 LAB — T4: T4, Total: 6.6 ug/dL (ref 4.5–12.0)

## 2019-11-17 ENCOUNTER — Other Ambulatory Visit: Payer: Self-pay

## 2019-11-17 ENCOUNTER — Telehealth: Payer: Self-pay

## 2019-11-17 ENCOUNTER — Other Ambulatory Visit: Payer: Self-pay | Admitting: *Deleted

## 2019-11-17 LAB — CA 125: Cancer Antigen (CA) 125: 456 U/mL — ABNORMAL HIGH (ref 0.0–38.1)

## 2019-11-17 MED ORDER — FLUTICASONE-SALMETEROL 100-50 MCG/DOSE IN AEPB: INHALATION_SPRAY | RESPIRATORY_TRACT | 2 refills | Status: DC

## 2019-11-17 MED ORDER — ENALAPRIL MALEATE 20 MG PO TABS: 20.0000 mg | ORAL_TABLET | Freq: Every day | ORAL | 1 refills | Status: DC

## 2019-11-17 NOTE — Telephone Encounter (Signed)
[name], daughter called and left a message to call her.  Called back. Unable to leave message. Voicemail full.

## 2019-11-18 ENCOUNTER — Ambulatory Visit: Payer: Medicare Other | Admitting: Family Medicine

## 2019-11-18 ENCOUNTER — Other Ambulatory Visit: Payer: Self-pay

## 2019-11-18 ENCOUNTER — Encounter: Payer: Self-pay | Admitting: Family Medicine

## 2019-11-18 ENCOUNTER — Ambulatory Visit (INDEPENDENT_AMBULATORY_CARE_PROVIDER_SITE_OTHER): Payer: Medicare Other | Admitting: Family Medicine

## 2019-11-18 DIAGNOSIS — L97909 Non-pressure chronic ulcer of unspecified part of unspecified lower leg with unspecified severity: Secondary | ICD-10-CM

## 2019-11-18 DIAGNOSIS — I83009 Varicose veins of unspecified lower extremity with ulcer of unspecified site: Secondary | ICD-10-CM

## 2019-11-21 ENCOUNTER — Encounter: Payer: Self-pay | Admitting: Family Medicine

## 2019-11-21 NOTE — Progress Notes (Signed)
    SUBJECTIVE:   CHIEF COMPLAINT / HPI:   Follow Up Venous Ulcer  Patient following up for venous ulcer today.  Patient reports continued care at home.  She does report some drainage recently but otherwise no acute changes.  She reports that her oncologist would like her to be seen with wound care clinic.  Her first appointment is on July 1.  PERTINENT  PMH / PSH: Venous ulcer, hyperlipidemia, history of deep venous thrombophlebitis  OBJECTIVE:   BP 118/64   Pulse 100   Wt 205 lb (93 kg)   SpO2 96%   BMI 32.11 kg/m   Well-appearing nontoxic female   2+ pitting edema on bilateral lower extremities.  No abnormal odor.    ASSESSMENT/PLAN:   Venous ulcer ([place]rin [place]arza) Wound gently cleaned with normal saline and gauze.  Covered with nonadhesive dressing and wrapped.  No acute abnormalities. Patient to start following at [facility].      Wilber Oliphant, MD [place]

## 2019-11-21 NOTE — Assessment & Plan Note (Addendum)
Wound gently cleaned with normal saline and gauze.  Covered with nonadhesive dressing and wrapped.  No acute abnormalities. Patient to start following at [facility].

## 2019-11-22 ENCOUNTER — Telehealth: Payer: Self-pay

## 2019-11-22 NOTE — Telephone Encounter (Signed)
Called and given below message. She verbalized understanding. Diarrhea has resolved. She only had a couple episodes of the diarrhea. Instructed per Dr. Alvy Bimler, to resume [place] today and take it every other day. She verbalized understanding.

## 2019-11-22 NOTE — Telephone Encounter (Signed)
-----   Message from [name], MD sent at 11/22/2019  8:01 AM EDT ----- Regarding: can you call if her diarrhea is better, if so resume Lenvima

## 2019-12-02 ENCOUNTER — Encounter (HOSPITAL_BASED_OUTPATIENT_CLINIC_OR_DEPARTMENT_OTHER): Payer: Medicare Other | Attending: Internal Medicine | Admitting: Internal Medicine

## 2019-12-02 DIAGNOSIS — Z87891 Personal history of nicotine dependence: Secondary | ICD-10-CM | POA: Diagnosis not present

## 2019-12-02 DIAGNOSIS — I87332 Chronic venous hypertension (idiopathic) with ulcer and inflammation of left lower extremity: Secondary | ICD-10-CM | POA: Diagnosis not present

## 2019-12-02 DIAGNOSIS — Z8542 Personal history of malignant neoplasm of other parts of uterus: Secondary | ICD-10-CM | POA: Insufficient documentation

## 2019-12-02 DIAGNOSIS — L97528 Non-pressure chronic ulcer of other part of left foot with other specified severity: Secondary | ICD-10-CM | POA: Insufficient documentation

## 2019-12-02 DIAGNOSIS — I87322 Chronic venous hypertension (idiopathic) with inflammation of left lower extremity: Secondary | ICD-10-CM | POA: Diagnosis not present

## 2019-12-02 DIAGNOSIS — I10 Essential (primary) hypertension: Secondary | ICD-10-CM | POA: Diagnosis not present

## 2019-12-02 NOTE — Progress Notes (Signed)
Esparza, Erin B. ([id number]) Visit Report for 12/02/2019 HPI Details Patient Name: Date of Service: Erin Esparza NN B. 12/02/2019 2:45 PM Medical Record Number: [id number] Patient Account Number: [id number] Date of Birth/Sex: Treating RN: December 28, 1940 (79 y.o. [name] Primary Care Provider: Jari Esparza RD, SA MA NTHA Other Clinician: Referring Provider: Treating Provider/Extender: Constance Haw RD, SA MA NTHA Weeks in Treatment: 0 History of Present Illness HPI Description: ADMISSION 12/02/2019; This is an 79 year old woman who has been followed for a prolonged period of time by the family practice center for a wound on her left medial lower extremity. She has been in an [employer] for a period of time since early April and more recently at the beginning of June was put on a Profore wrap. This is for a fairly sizable wound on the left medial ankle. There are pictures to look at in epic and at 1 point this was really quite large although has been getting progressively smaller. She does not have an arterial issue by palpation of her pulses also in 2018 she had ABIs done that showed an ABI of 0.96 on the right and 0.86 on the left Past medical history; patient has a long history of chronic venous insufficiency she has compression stockings although they are relatively old. History of uterine CA hypertension and hyperlipidemia. Electronic Signature(s) Signed: 12/02/2019 5:03:02 PM By: Erin Esparza Entered By: Erin Ham on 12/02/2019 15:54:18 -------------------------------------------------------------------------------- Physical Exam Details Patient Name: Date of Service: Erin Esparza, A NN B. 12/02/2019 2:45 PM Medical Record Number: [id number] Patient Account Number: [id number] Date of Birth/Sex: Treating RN: [birth year] (79 y.o. [name] Primary Care Provider: Jari Esparza RD, SA MA NTHA Other Clinician: Referring Provider: Treating Provider/Extender: Constance Haw RD, SA  MA NTHA Weeks in Treatment: 0 Constitutional Sitting or standing Blood Pressure is within target range for patient.. Pulse regular and within target range for patient.Marland Kitchen Respirations regular, non-labored and within target range.. Temperature is normal and within the target range for the patient.Marland Kitchen Appears in no distress. Respiratory work of breathing is normal. Cardiovascular pedal pulses are palpable on the left. 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 Hemosiderin deposition.. Integumentary (Hair, Skin) . Notes Wound exam; there is no open wound. On the left medial lower leg and ankle what was a fairly extensive area has fully epithelialized. She does not have good edema control severe chronic hemosiderin deposition probably with some degree of secondary lymphedema Electronic Signature(s) Signed: 12/02/2019 5:03:02 PM By: Erin Esparza Entered By: Erin Ham on 12/02/2019 15:58:46 -------------------------------------------------------------------------------- Physician Orders Details Patient Name: Date of Service: Erin Esparza, A NN B. 12/02/2019 2:45 PM Medical Record Number: [id number] Patient Account Number: [id number] Date of Birth/Sex: Treating RN: [birth year] (79 y.o. [name] Primary Care Provider: Jari Esparza RD, SA MA NTHA Other Clinician: Referring Provider: Treating Provider/Extender: Constance Haw RD, SA MA NTHA Weeks in Treatment: 0 Verbal / Phone Orders: No Diagnosis Coding Discharge From [facility] Services Discharge from [facility] - call if wound re-opens Skin Barriers/Peri-Wound Care Moisturizing lotion Edema Control Avoid standing for long periods of time Elevate legs to the level of the heart or above for 30 minutes daily and/or when sitting, a frequency of: Other: - Wear compression stocking bilaterally. EVERY DAY. Put on first thing in the morning and remove at night before bed Electronic Signature(s) Signed: 12/02/2019  4:13:30 PM By: Erin Esparza Signed: 12/02/2019 5:03:02 PM By: Erin Esparza Entered By: Erin Esparza on  12/02/2019 15:32:07 -------------------------------------------------------------------------------- Problem List Details Patient Name: Date of Service: Erin Esparza NN B. 12/02/2019 2:45 PM Medical Record Number: [id number] Patient Account Number: [id number] Date of Birth/Sex: Treating RN: Dec 26, 1940 (79 y.o. 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 12/02/2019 2:45 PM Medical Record Number: [id number] Patient Account Number: [id number] Date of Birth/Sex: Treating RN: 1940/08/03 (79 y.o. [name] Primary Care Provider: Jari Esparza RD, SA MA NTHA Other Clinician: Referring Provider: Treating Provider/Extender: Constance Haw RD, SA MA NTHA Weeks in Treatment: 0 Subjective History of Present Illness (HPI) ADMISSION 12/02/2019; This is an 79 year old woman who has been followed for a prolonged period of time by the family practice center for a wound on her left medial lower extremity. She has been in an [employer] for a period of time since early April and more recently at the beginning of June was put on a Profore  wrap. This is for a fairly sizable wound on the left medial ankle. There are pictures to look at in epic and at 1 point this was really quite large although has been getting progressively smaller. She does not have an arterial issue by palpation of her pulses also in 2018 she had ABIs done that showed an ABI of 0.96 on the right and 0.86 on the left Past medical history; patient has a long history of chronic venous insufficiency she has compression stockings although they are relatively old. History of uterine CA hypertension and hyperlipidemia. Patient History Information obtained from Patient. Allergies No Known Allergies Family History Cancer - Siblings, No family history of Diabetes, Heart Disease, Hereditary Spherocytosis, Hypertension, Kidney Disease, Lung Disease, Seizures, Stroke, Thyroid Problems, Tuberculosis. Social History Former smoker, Marital Status - Widowed, Alcohol Use - Never, Drug Use - No History, Caffeine Use - Never. Medical History Eyes Denies history of Cataracts, Glaucoma, Optic Neuritis Ear/Nose/Mouth/Throat Denies history of Chronic sinus problems/congestion, Middle ear problems Hematologic/Lymphatic Denies history of Anemia, Hemophilia, Human Immunodeficiency Virus, Lymphedema, Sickle Cell Disease Respiratory Denies history of Aspiration, Asthma, Chronic Obstructive Pulmonary Disease (COPD), Pneumothorax, Sleep Apnea, Tuberculosis Cardiovascular Patient has history of Hypertension Denies history of Angina, Arrhythmia, Congestive Heart Failure, Coronary Artery Disease, Deep Vein Thrombosis, Hypotension, Myocardial Infarction, Peripheral Arterial Disease, Peripheral Venous Disease, Phlebitis, Vasculitis Gastrointestinal Denies history of Cirrhosis , Colitis, Crohnoos, Hepatitis A, Hepatitis B, Hepatitis C Endocrine Denies history of Type I Diabetes, Type II Diabetes Genitourinary Denies history of End Stage Renal Disease Immunological Denies history of  Lupus Erythematosus, Raynaudoos, Scleroderma Integumentary (Skin) Denies history of History of Burn Musculoskeletal Denies history of Gout, Rheumatoid Arthritis, Osteoarthritis, Osteomyelitis Neurologic Denies history of Dementia, Neuropathy, Quadriplegia, Paraplegia, Seizure Disorder Oncologic Patient has history of Received Chemotherapy - 2020 Denies history of Received Radiation Psychiatric Denies history of Anorexia/bulimia, Confinement Anxiety Review of Systems (ROS) Constitutional Symptoms (General Health) Denies complaints or symptoms of Fatigue, Fever, Chills, Marked Weight Change. Eyes Complains or has symptoms of Glasses /  Contacts. Denies complaints or symptoms of Dry Eyes, Vision Changes. Ear/Nose/Mouth/Throat Denies complaints or symptoms of Chronic sinus problems or rhinitis. Respiratory Denies complaints or symptoms of Chronic or frequent coughs, Shortness of Breath. Cardiovascular Denies complaints or symptoms of Chest pain. Gastrointestinal Denies complaints or symptoms of Frequent diarrhea, Nausea, Vomiting. Endocrine Denies complaints or symptoms of Heat/cold intolerance. Genitourinary Denies complaints or symptoms of Frequent urination. Integumentary (Skin) Denies complaints or symptoms of Wounds. Musculoskeletal Denies complaints or symptoms of Muscle Pain, Muscle Weakness. Neurologic Denies complaints or symptoms of Numbness/parasthesias. Psychiatric Denies complaints or symptoms of Claustrophobia, Suicidal. Objective Constitutional Sitting or standing Blood Pressure is within target range for patient.. Pulse regular and within target range for patient.Marland Kitchen Respirations regular, non-labored and within target range.. Temperature is normal and within the target range for the patient.Marland Kitchen Appears in no distress. Vitals Time Taken: 2:45 PM, Height: 66 in, Source: Stated, Weight: 204 lbs, Source: Stated, BMI: 32.9, Temperature: 98.6 F, Pulse: 84 bpm,  Respiratory Rate: 18 breaths/min, Blood Pressure: 129/75 mmHg. Respiratory work of breathing is normal. Cardiovascular pedal pulses are palpable on the left. 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 Hemosiderin deposition.. General Notes: Wound exam; there is no open wound. On the left medial lower leg and ankle what was a fairly extensive area has fully epithelialized. 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 EVERY DAY. Put on first thing in the morning and remove at night before bed 1 the patient does not have an open wound. 2. Her primary physicians have done a good job with the compression necessary to heal this wound 3. She has I think 20/30 below-knee stockings but I am not exactly sure if this is enough compression to maintain skin integrity in this area. 4. I told her to lubricate the skin on her legs at least every night may be twice a day 5. If the 20th/30 stockings are inadequate there are 30/40 over the toe stockings but very difficult for older people to get on. I think she might do better with an external compression stocking like a juxta lite stocking or something similar I asked the patient to call us if indeed this reopens or if she has questions Electronic Signature(s) Signed: 12/02/2019 5:03:02  PM By: Erin Esparza Entered By: Erin Ham on 12/02/2019 16:07:15 -------------------------------------------------------------------------------- HxROS Details Patient Name: Date of Service: Erin Esparza, A NN B. 12/02/2019 2:45 PM Medical Record Number: [id number] Patient Account Number: [id number] Date of Birth/Sex: Treating RN: 1941/04/23 (79 y.o. [name] Primary Care Provider: Jari Esparza RD, SA MA NTHA Other Clinician: Referring Provider: Treating Provider/Extender: Constance Haw RD, SA MA NTHA Weeks in Treatment: 0 Information Obtained From Patient Constitutional Symptoms (General Health) Complaints and Symptoms: Negative for: Fatigue; Fever; Chills; Marked Weight Change Eyes Complaints and Symptoms: Positive for: Glasses / Contacts Negative for: Dry Eyes; Vision Changes Medical History: Negative for: Cataracts; Glaucoma; Optic Neuritis Ear/Nose/Mouth/Throat Complaints and Symptoms: Negative for:  Chronic sinus problems or rhinitis Medical History: Negative for: Chronic sinus problems/congestion; Middle ear problems Respiratory Complaints and Symptoms: Negative for: Chronic or frequent coughs; Shortness of Breath Medical History: Negative for: Aspiration; Asthma; Chronic Obstructive Pulmonary Disease (COPD); Pneumothorax; Sleep Apnea; Tuberculosis Cardiovascular Complaints and Symptoms: Negative for: Chest pain Medical History: Positive for: Hypertension Negative for: Angina; Arrhythmia; Congestive Heart Failure; Coronary Artery Disease; Deep Vein Thrombosis; Hypotension; Myocardial Infarction; Peripheral Arterial Disease; Peripheral Venous Disease; Phlebitis; Vasculitis Gastrointestinal Complaints and Symptoms: Negative for: Frequent diarrhea; Nausea; Vomiting Medical History: Negative for: Cirrhosis ; Colitis; Crohns; Hepatitis A; Hepatitis B; Hepatitis C Endocrine Complaints and Symptoms: Negative for: Heat/cold intolerance Medical  History: Negative for: Type I Diabetes; Type II Diabetes Genitourinary Complaints and Symptoms: Negative for: Frequent urination Medical History: Negative for: End Stage Renal Disease Integumentary (Skin) Complaints and Symptoms: Negative for: Wounds Medical History: Negative for: History of Burn Musculoskeletal Complaints and Symptoms: Negative for: Muscle Pain; Muscle Weakness Medical History: Negative for: Gout; Rheumatoid Arthritis; Osteoarthritis; Osteomyelitis Neurologic Complaints and Symptoms: Negative for: Numbness/parasthesias Medical History: Negative for: Dementia; Neuropathy; Quadriplegia; Paraplegia; Seizure Disorder Psychiatric Complaints and Symptoms: Negative for: Claustrophobia; Suicidal Medical History: Negative for: Anorexia/bulimia; Confinement Anxiety Hematologic/Lymphatic Medical History: Negative for: Anemia; Hemophilia; Human Immunodeficiency Virus; Lymphedema; Sickle Cell Disease Immunological Medical History: Negative for: Lupus Erythematosus; Raynauds; Scleroderma Oncologic Medical History: Positive for: Received Chemotherapy - 2020 Negative for: Received Radiation Immunizations Pneumococcal Vaccine: Received Pneumococcal Vaccination: No Implantable Devices None Family and Social History Cancer: Yes - Siblings; Diabetes: No; Heart Disease: No; Hereditary Spherocytosis: No; Hypertension: No; Kidney Disease: No; Lung Disease: No; Seizures: No; Stroke: No; Thyroid Problems: No; Tuberculosis: No; Former smoker; Marital Status - Widowed; Alcohol Use: Never; Drug Use: No History; Caffeine Use: Never; Financial Concerns: No; Food, Clothing or Shelter Needs: No; Support System Lacking: No; Transportation Concerns: No Electronic Signature(s) Signed: 12/02/2019 3:42:07 PM By: Carlene Coria RN Signed: 12/02/2019 5:03:02 PM By: Erin Esparza Entered By: Carlene Coria on 12/02/2019  14:55:56 -------------------------------------------------------------------------------- SuperBill Details Patient Name: Date of Service: Erin Esparza, A NN B. 12/02/2019 Medical Record Number: [id number] Patient Account Number: [id number] Date of Birth/Sex: Treating RN: 1941/01/24 (79 y.o. 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

## 2019-12-02 NOTE — Progress Notes (Signed)
FACEMIRE, Avanti B. ([id number]) Visit Report for 12/02/2019 Allergy List Details Patient Name: Date of Service: Erin Dolores NN B. 12/02/2019 2:45 PM Medical Record Number: [id number] Patient Account Number: [id number] Date of Birth/Sex: Treating RN: 1940/09/16 (79 y.o. Erin Esparza Primary Care Erin Esparza: Erin Esparza RD, SA MA NTHA Other Clinician: Referring Erin Esparza: Treating Erin Esparza/Extender: Erin Esparza RD, SA MA NTHA Weeks in Treatment: 0 Allergies Active Allergies No Known Allergies Allergy Notes Electronic Signature(s) Signed: 12/02/2019 3:42:07 PM By: Carlene Coria RN Entered By: Carlene Coria on 12/02/2019 14:53:21 -------------------------------------------------------------------------------- Arrival Information Details Patient Name: Date of Service: Erin Esparza, Erin NN B. 12/02/2019 2:45 PM Medical Record Number: [id number] Patient Account Number: [id number] Date of Birth/Sex: Treating RN: 1941-02-20 (79 y.o. Erin Esparza Primary Care Erin Esparza: Erin Esparza RD, SA MA NTHA Other Clinician: Referring Erin Esparza: Treating Erin Esparza/Extender: Erin Esparza RD, SA MA NTHA Weeks in Treatment: 0 Visit Information Patient Arrived: Erin Esparza Arrival Time: 14:45 Accompanied By: self Transfer Assistance: None Patient Identification Verified: Yes Secondary Verification Process Completed: Yes Electronic Signature(s) Signed: 12/02/2019 3:42:07 PM By: Carlene Coria RN Entered By: Carlene Coria on 12/02/2019 14:52:39 -------------------------------------------------------------------------------- Clinic Level of Care Assessment Details Patient Name: Date of Service: Erin Dolores NN B. 12/02/2019 2:45 PM Medical Record Number: [id number] Patient Account Number: [id number] Date of Birth/Sex: Treating RN: February 15, 1941 (79 y.o. Erin Esparza Primary Care Erin Esparza: Erin Esparza RD, SA MA NTHA Other Clinician: Referring Erin Esparza: Treating Erin Esparza/Extender: Erin Esparza RD, SA MA NTHA Weeks in  Treatment: 0 Clinic Level of Care Assessment Items TOOL 2 Quantity Score X- 1 0 Use when only an EandM is performed on the INITIAL visit ASSESSMENTS - Nursing Assessment / Reassessment X- 1 20 General Physical Exam (combine w/ comprehensive assessment (listed just below) when performed on new pt. evals) X- 1 25 Comprehensive Assessment (HX, ROS, Risk Assessments, Wounds Hx, etc.) ASSESSMENTS - Wound and Skin Erin ssessment / Reassessment X - Simple Wound Assessment / Reassessment - one wound 1 5 []  - 0 Complex Wound Assessment / Reassessment - multiple wounds []  - 0 Dermatologic / Skin Assessment (not related to wound area) ASSESSMENTS - Ostomy and/or Continence Assessment and Care []  - 0 Incontinence Assessment and Management []  - 0 Ostomy Care Assessment and Management (repouching, etc.) PROCESS - Coordination of Care X - Simple Patient / Family Education for ongoing care 1 15 []  - 0 Complex (extensive) Patient / Family Education for ongoing care X- 1 10 Staff obtains Programmer, systems, Records, T Results / Process Orders est []  - 0 Staff telephones HHA, Nursing Homes / Clarify orders / etc []  - 0 Routine Transfer to another Facility (non-emergent condition) []  - 0 Routine Hospital Admission (non-emergent condition) X- 1 15 New Admissions / Biomedical engineer / Ordering NPWT Apligraf, etc. , []  - 0 Emergency Hospital Admission (emergent condition) X- 1 10 Simple Discharge Coordination []  - 0 Complex (extensive) Discharge Coordination PROCESS - Special Needs []  - 0 Pediatric / Minor Patient Management []  - 0 Isolation Patient Management []  - 0 Hearing / Language / Visual special needs []  - 0 Assessment of Community assistance (transportation, D/C planning, etc.) []  - 0 Additional assistance / Altered mentation []  - 0 Support Surface(s) Assessment (bed, cushion, seat, etc.) INTERVENTIONS - Wound Cleansing / Measurement []  - 0 Wound Imaging (photographs - any number  of wounds) []  - 0 Wound Tracing (instead of photographs) []  - 0 Simple Wound Measurement - one wound []  - 0 Complex Wound Measurement - multiple  wounds []  - 0 Simple Wound Cleansing - one wound []  - 0 Complex Wound Cleansing - multiple wounds INTERVENTIONS - Wound Dressings []  - 0 Small Wound Dressing one or multiple wounds []  - 0 Medium Wound Dressing one or multiple wounds []  - 0 Large Wound Dressing one or multiple wounds []  - 0 Application of Medications - injection INTERVENTIONS - Miscellaneous []  - 0 External ear exam []  - 0 Specimen Collection (cultures, biopsies, blood, body fluids, etc.) []  - 0 Specimen(s) / Culture(s) sent or taken to Lab for analysis []  - 0 Patient Transfer (multiple staff / Harrel Lemon Lift / Similar devices) []  - 0 Simple Staple / Suture removal (25 or less) []  - 0 Complex Staple / Suture removal (26 or more) []  - 0 Hypo / Hyperglycemic Management (close monitor of Blood Glucose) []  - 0 Ankle / Brachial Index (ABI) - do not check if billed separately Has the patient been seen at the hospital within the last three years: Yes Total Score: 100 Level Of Care: New/Established - Level 3 Electronic Signature(s) Signed: 12/02/2019 4:13:30 PM By: Erin Esparza Entered By: Erin Esparza on 12/02/2019 15:32:51 -------------------------------------------------------------------------------- Multi Wound Chart Details Patient Name: Date of Service: Erin Esparza, Erin NN B. 12/02/2019 2:45 PM Medical Record Number: [id number] Patient Account Number: [id number] Date of Birth/Sex: Treating RN: Oct 16, 1940 (79 y.o. F) Erin Esparza Primary Care Erin Esparza: Erin Esparza RD, SA MA NTHA Other Clinician: Referring Anala Whisenant: Treating Erin Esparza/Extender: Erin Esparza RD, SA MA NTHA Weeks in Treatment: 0 Vital Signs Height(in): 66 Pulse(bpm): 84 Weight(lbs): 204 Blood Pressure(mmHg): 129/75 Body Mass Index(BMI): 33 Temperature(F): 98.6 Respiratory  Rate(breaths/min): 18 Wound Assessments Treatment Notes Electronic Signature(s) Signed: 12/02/2019 4:13:30 PM By: Erin Esparza Signed: 12/02/2019 5:03:02 PM By: Linton Ham MD Entered By: Linton Ham on 12/02/2019 15:49:36 -------------------------------------------------------------------------------- Pain Assessment Details Patient Name: Date of Service: Erin Esparza, Erin NN B. 12/02/2019 2:45 PM Medical Record Number: [id number] Patient Account Number: [id number] Date of Birth/Sex: Treating RN: 07/29/1940 (79 y.o. Erin Esparza Primary Care Tayvian Holycross: Erin Esparza RD, SA MA NTHA Other Clinician: Referring Igor Bishop: Treating Lua Feng/Extender: Erin Esparza RD, SA MA NTHA Weeks in Treatment: 0 Active Problems Location of Pain Severity and Description of Pain Patient Has Paino No Site Locations Pain Management and Medication Current Pain Management: Electronic Signature(s) Signed: 12/02/2019 3:42:07 PM By: Carlene Coria RN Entered By: Carlene Coria on 12/02/2019 14:58:38 -------------------------------------------------------------------------------- Vitals Details Patient Name: Date of Service: Erin Esparza, Erin NN B. 12/02/2019 2:45 PM Medical Record Number: [id number] Patient Account Number: [id number] Date of Birth/Sex: Treating RN: [birth year] (79 y.o. F) Dwiggins, Larene Beach Primary Care Oliana Gowens: Erin Esparza RD, SA MA NTHA Other Clinician: Referring Takeshi Teasdale: Treating Sylar Voong/Extender: Erin Esparza RD, SA MA NTHA Weeks in Treatment: 0 Vital Signs Time Taken: 14:45 Temperature (F): 98.6 Height (in): 66 Pulse (bpm): 84 Source: Stated Respiratory Rate (breaths/min): 18 Weight (lbs): 204 Blood Pressure (mmHg): 129/75 Source: Stated Reference Range: 80 - 120 mg / dl Body Mass Index (BMI): 32.9 Electronic Signature(s) Signed: 12/02/2019 3:42:07 PM By: Carlene Coria RN Entered By: Carlene Coria on 12/02/2019 14:52:50

## 2019-12-02 NOTE — Progress Notes (Signed)
Erin Esparza, Erin B. ([id number]) Visit Report for 12/02/2019 Abuse/Suicide Risk Screen Details Patient Name: Date of Service: Erin Dolores NN B. 12/02/2019 2:45 PM Medical Record Number: [id number] Patient Account Number: [id number] Date of Birth/Sex: Treating RN: Jan 12, 1941 (79 y.o. Female) Carlene Coria Primary Care Provider: Jari Pigg RD, SA MA NTHA Other Clinician: Referring Provider: Treating Provider/Extender: Evon Slack, Ni Weeks in Treatment: 0 Abuse/Suicide Risk Screen Items Answer ABUSE RISK SCREEN: Has anyone close to you tried to hurt or harm you recentlyo No Do you feel uncomfortable with anyone in your familyo No Has anyone forced you do things that you didnt want to doo No Electronic Signature(s) Signed: 12/02/2019 3:42:07 PM By: Carlene Coria RN Entered By: Carlene Coria on 12/02/2019 14:56:05 -------------------------------------------------------------------------------- Activities of Daily Living Details Patient Name: Date of Service: Erin Dolores NN B. 12/02/2019 2:45 PM Medical Record Number: [id number] Patient Account Number: [id number] Date of Birth/Sex: Treating RN: Oct 01, 1940 (79 y.o. Female) Carlene Coria Primary Care Provider: Jari Pigg RD, SA MA NTHA Other Clinician: Referring Provider: Treating Provider/Extender: Evon Slack, Ni Weeks in Treatment: 0 Activities of Daily Living Items Answer Activities of Daily Living (Please select one for each item) Drive Automobile Not Able T Medications ake Completely Able Use T elephone Completely Able Care for Appearance Completely Able Use T oilet Completely Able Bath / Shower Completely Able Dress Self Completely Able Feed Self Completely Able Walk Completely Able Get In / Out Bed Completely Able Housework Completely Able Prepare Meals Completely Able Handle Money Completely Able Shop for Self Completely Able Electronic Signature(s) Signed: 12/02/2019 3:42:07 PM By: Carlene Coria RN Entered By: Carlene Coria  on 12/02/2019 14:56:32 -------------------------------------------------------------------------------- Education Screening Details Patient Name: Date of Service: Erin Esparza, Erin NN B. 12/02/2019 2:45 PM Medical Record Number: [id number] Patient Account Number: [id number] Date of Birth/Sex: Treating RN: 10/02/1940 (79 y.o. Female) Carlene Coria Primary Care Provider: Jari Pigg RD, SA MA NTHA Other Clinician: Referring Provider: Treating Provider/Extender: Evon Slack, Ni Weeks in Treatment: 0 Primary Learner Assessed: Patient Learning Preferences/Education Level/Primary Language Learning Preference: Explanation Highest Education Level: College or Above Preferred Language: English Cognitive Barrier Language Barrier: No Translator Needed: No Memory Deficit: No Emotional Barrier: No Cultural/Religious Beliefs Affecting Medical Care: No Physical Barrier Impaired Vision: No Impaired Hearing: No Decreased Hand dexterity: No Knowledge/Comprehension Knowledge Level: Medium Comprehension Level: High Ability to understand written instructions: High Ability to understand verbal instructions: High Motivation Anxiety Level: Anxious Cooperation: Cooperative Education Importance: Acknowledges Need Interest in Health Problems: Asks Questions Perception: Coherent Willingness to Engage in Self-Management High Activities: Readiness to Engage in Self-Management High Activities: Electronic Signature(s) Signed: 12/02/2019 3:42:07 PM By: Carlene Coria RN Entered By: Carlene Coria on 12/02/2019 14:57:01 -------------------------------------------------------------------------------- Fall Risk Assessment Details Patient Name: Date of Service: Erin Esparza, Erin NN B. 12/02/2019 2:45 PM Medical Record Number: [id number] Patient Account Number: [id number] Date of Birth/Sex: Treating RN: 1940/08/09 (79 y.o. Female) Carlene Coria Primary Care Provider: Jari Pigg RD, SA MA NTHA Other Clinician: Referring  Provider: Treating Provider/Extender: Evon Slack, Ni Weeks in Treatment: 0 Fall Risk Assessment Items Have you had 2 or more falls in the last 12 monthso 0 No Have you had any fall that resulted in injury in the last 12 monthso 0 No FALLS RISK SCREEN History of falling - immediate or within 3 months 0 No Secondary diagnosis (Do you have 2 or more medical diagnoseso) 0 No Ambulatory aid None/bed rest/wheelchair/nurse 0 No Crutches/cane/walker 0 No Furniture 0 No Intravenous therapy  Access/Saline/Heparin Lock 0 No Gait/Transferring Normal/ bed rest/ wheelchair 0 No Weak (short steps with or without shuffle, stooped but able to lift head while walking, may seek 0 No support from furniture) Impaired (short steps with shuffle, may have difficulty arising from chair, head down, impaired 0 No balance) Mental Status Oriented to own ability 0 No Electronic Signature(s) Signed: 12/02/2019 3:42:07 PM By: Carlene Coria RN Entered By: Carlene Coria on 12/02/2019 14:57:10 -------------------------------------------------------------------------------- Foot Assessment Details Patient Name: Date of Service: Erin Esparza, Erin NN B. 12/02/2019 2:45 PM Medical Record Number: [id number] Patient Account Number: [id number] Date of Birth/Sex: Treating RN: 1941/05/15 (79 y.o. Female) Carlene Coria Primary Care Provider: Jari Pigg RD, SA MA NTHA Other Clinician: Referring Provider: Treating Provider/Extender: Evon Slack, Ni Weeks in Treatment: 0 Foot Assessment Items Site Locations + = Sensation present, - = Sensation absent, C = Callus, U = Ulcer R = Redness, W = Warmth, M = Maceration, PU = Pre-ulcerative lesion F = Fissure, S = Swelling, D = Dryness Assessment Right: Left: Other Deformity: No No Prior Foot Ulcer: No No Prior Amputation: No No Charcot Joint: No No Ambulatory Status: Ambulatory Without Help Gait: Steady Electronic Signature(s) Signed: 12/02/2019 3:42:07 PM By:  Carlene Coria RN Entered By: Carlene Coria on 12/02/2019 14:58:28 -------------------------------------------------------------------------------- Nutrition Risk Screening Details Patient Name: Date of Service: Erin Esparza, Erin NN B. 12/02/2019 2:45 PM Medical Record Number: [id number] Patient Account Number: [id number] Date of Birth/Sex: Treating RN: [birth year] (79 y.o. Female) Carlene Coria Primary Care Provider: Jari Pigg RD, SA MA NTHA Other Clinician: Referring Provider: Treating Provider/Extender: Evon Slack, Ni Weeks in Treatment: 0 Height (in): 66 Weight (lbs): 204 Body Mass Index (BMI): 32.9 Nutrition Risk Screening Items Score Screening NUTRITION RISK SCREEN: I have an illness or condition that made me change the kind and/or amount of food I eat 0 No I eat fewer than two meals per day 0 No I eat few fruits and vegetables, or milk products 0 No I have three or more drinks of beer, liquor or wine almost every day 0 No I have tooth or mouth problems that make it hard for me to eat 0 No I don't always have enough money to buy the food I need 0 No I eat alone most of the time 0 No I take three or more different prescribed or over-the-counter drugs Erin day 1 Yes Without wanting to, I have lost or gained 10 pounds in the last six months 0 No I am not always physically able to shop, cook and/or feed myself 0 No Nutrition Protocols Good Risk Protocol 0 No interventions needed Moderate Risk Protocol High Risk Proctocol Risk Level: Good Risk Score: 1 Electronic Signature(s) Signed: 12/02/2019 3:42:07 PM By: Carlene Coria RN Entered By: Carlene Coria on 12/02/2019 14:57:21

## 2019-12-06 ENCOUNTER — Inpatient Hospital Stay (HOSPITAL_BASED_OUTPATIENT_CLINIC_OR_DEPARTMENT_OTHER): Payer: Medicare Other | Admitting: Medical

## 2019-12-06 ENCOUNTER — Inpatient Hospital Stay: Payer: Medicare Other

## 2019-12-06 ENCOUNTER — Encounter: Payer: Self-pay | Admitting: Hematology and Oncology

## 2019-12-06 ENCOUNTER — Inpatient Hospital Stay: Payer: Medicare Other | Attending: Gynecologic Oncology | Admitting: Hematology and Oncology

## 2019-12-06 ENCOUNTER — Other Ambulatory Visit: Payer: Self-pay

## 2019-12-06 VITALS — BP 124/59 | HR 120 | Temp 98.0°F | Resp 20

## 2019-12-06 DIAGNOSIS — C541 Malignant neoplasm of endometrium: Secondary | ICD-10-CM | POA: Insufficient documentation

## 2019-12-06 DIAGNOSIS — C55 Malignant neoplasm of uterus, part unspecified: Secondary | ICD-10-CM | POA: Diagnosis not present

## 2019-12-06 DIAGNOSIS — R946 Abnormal results of thyroid function studies: Secondary | ICD-10-CM | POA: Diagnosis not present

## 2019-12-06 DIAGNOSIS — N183 Chronic kidney disease, stage 3 unspecified: Secondary | ICD-10-CM

## 2019-12-06 DIAGNOSIS — D6481 Anemia due to antineoplastic chemotherapy: Secondary | ICD-10-CM | POA: Diagnosis not present

## 2019-12-06 DIAGNOSIS — Z5112 Encounter for antineoplastic immunotherapy: Secondary | ICD-10-CM | POA: Diagnosis not present

## 2019-12-06 DIAGNOSIS — D631 Anemia in chronic kidney disease: Secondary | ICD-10-CM | POA: Insufficient documentation

## 2019-12-06 DIAGNOSIS — T80810A Extravasation of vesicant antineoplastic chemotherapy, initial encounter: Secondary | ICD-10-CM

## 2019-12-06 LAB — CBC WITH DIFFERENTIAL (CANCER CENTER ONLY)
Abs Immature Granulocytes: 0.01 10*3/uL (ref 0.00–0.07)
Basophils Absolute: 0 10*3/uL (ref 0.0–0.1)
Basophils Relative: 1 %
Eosinophils Absolute: 0.4 10*3/uL (ref 0.0–0.5)
Eosinophils Relative: 6 %
HCT: 33.8 % — ABNORMAL LOW (ref 36.0–46.0)
Hemoglobin: 11.3 g/dL — ABNORMAL LOW (ref 12.0–15.0)
Immature Granulocytes: 0 %
Lymphocytes Relative: 16 %
Lymphs Abs: 0.9 10*3/uL (ref 0.7–4.0)
MCH: 34 pg (ref 26.0–34.0)
MCHC: 33.4 g/dL (ref 30.0–36.0)
MCV: 101.8 fL — ABNORMAL HIGH (ref 80.0–100.0)
Monocytes Absolute: 0.8 10*3/uL (ref 0.1–1.0)
Monocytes Relative: 15 %
Neutro Abs: 3.6 10*3/uL (ref 1.7–7.7)
Neutrophils Relative %: 62 %
Platelet Count: 171 10*3/uL (ref 150–400)
RBC: 3.32 MIL/uL — ABNORMAL LOW (ref 3.87–5.11)
RDW: 14.9 % (ref 11.5–15.5)
WBC Count: 5.7 10*3/uL (ref 4.0–10.5)
nRBC: 0 % (ref 0.0–0.2)

## 2019-12-06 LAB — TSH: TSH: 10.01 u[IU]/mL — ABNORMAL HIGH (ref 0.308–3.960)

## 2019-12-06 LAB — CMP (CANCER CENTER ONLY)
ALT: 19 U/L (ref 0–44)
AST: 41 U/L (ref 15–41)
Albumin: 2.8 g/dL — ABNORMAL LOW (ref 3.5–5.0)
Alkaline Phosphatase: 65 U/L (ref 38–126)
Anion gap: 11 (ref 5–15)
BUN: 10 mg/dL (ref 8–23)
CO2: 22 mmol/L (ref 22–32)
Calcium: 9 mg/dL (ref 8.9–10.3)
Chloride: 105 mmol/L (ref 98–111)
Creatinine: 0.94 mg/dL (ref 0.44–1.00)
GFR, Est AFR Am: >60 mL/min (ref >60)
GFR, Estimated: 57 mL/min — ABNORMAL LOW (ref >60)
Glucose, Bld: 94 mg/dL (ref 70–99)
Potassium: 4 mmol/L (ref 3.5–5.1)
Sodium: 138 mmol/L (ref 135–145)
Total Bilirubin: 0.5 mg/dL (ref 0.3–1.2)
Total Protein: 6.7 g/dL (ref 6.5–8.1)

## 2019-12-06 LAB — TOTAL PROTEIN, URINE DIPSTICK: Protein, ur: 30 mg/dL — AB

## 2019-12-06 MED ORDER — LENVATINIB (10 MG DAILY DOSE) 10 MG PO CPPK: ORAL_CAPSULE | ORAL | 11 refills | Status: DC

## 2019-12-06 MED ORDER — SODIUM CHLORIDE 0.9 % IV SOLN
Freq: Once | INTRAVENOUS | Status: AC
  Filled 2019-12-06: qty 250

## 2019-12-06 MED ORDER — SODIUM CHLORIDE 0.9% FLUSH
10.0000 mL | Freq: Once | INTRAVENOUS | Status: AC
  Administered 2019-12-06: 10 mL
  Filled 2019-12-06: qty 10

## 2019-12-06 MED ORDER — DIPHENHYDRAMINE HCL 50 MG/ML IJ SOLN
25.0000 mg | Freq: Once | INTRAMUSCULAR | Status: AC
  Administered 2019-12-06: 25 mg via INTRAVENOUS

## 2019-12-06 MED ORDER — SODIUM CHLORIDE 0.9 % IV SOLN
200.0000 mg | Freq: Once | INTRAVENOUS | Status: AC
  Administered 2019-12-06: 200 mg via INTRAVENOUS
  Filled 2019-12-06: qty 8

## 2019-12-06 MED ORDER — DIPHENHYDRAMINE HCL 50 MG/ML IJ SOLN
INTRAMUSCULAR | Status: AC
  Filled 2019-12-06: qty 1

## 2019-12-06 MED ORDER — ONDANSETRON HCL 8 MG PO TABS
8.0000 mg | ORAL_TABLET | Freq: Once | ORAL | Status: AC
  Administered 2019-12-06: 8 mg via ORAL

## 2019-12-06 MED ORDER — SODIUM CHLORIDE 0.9% FLUSH
10.0000 mL | INTRAVENOUS | Status: DC | PRN
  Administered 2019-12-06: 10 mL
  Filled 2019-12-06: qty 10

## 2019-12-06 MED ORDER — HEPARIN SOD (PORK) LOCK FLUSH 100 UNIT/ML IV SOLN
500.0000 [IU] | Freq: Once | INTRAVENOUS | Status: AC | PRN
  Administered 2019-12-06: 500 [IU]
  Filled 2019-12-06: qty 5

## 2019-12-06 MED ORDER — ONDANSETRON HCL 8 MG PO TABS
ORAL_TABLET | ORAL | Status: AC
  Filled 2019-12-06: qty 1

## 2019-12-06 NOTE — Patient Instructions (Addendum)
[facility] Discharge Instructions for Patients Receiving Chemotherapy  Today you received the following immunotherapy agent: Pembrolizumab  To help prevent nausea and vomiting after your treatment, we encourage you to take your nausea medication as directed by your MD.   If you develop nausea and vomiting that is not controlled by your nausea medication, call the clinic.   BELOW ARE SYMPTOMS THAT SHOULD BE REPORTED IMMEDIATELY:  *FEVER GREATER THAN 100.5 F  *CHILLS WITH OR WITHOUT FEVER  NAUSEA AND VOMITING THAT IS NOT CONTROLLED WITH YOUR NAUSEA MEDICATION  *UNUSUAL SHORTNESS OF BREATH  *UNUSUAL BRUISING OR BLEEDING  TENDERNESS IN MOUTH AND THROAT WITH OR WITHOUT PRESENCE OF ULCERS  *URINARY PROBLEMS  *BOWEL PROBLEMS  UNUSUAL RASH Items with * indicate a potential emergency and should be followed up as soon as possible.  Feel free to call the clinic should you have any questions or concerns. The clinic phone number is [phone].  Please show the [facility] at check-in to the Emergency Department and triage nurse.  Diphenhydramine injection What is this medicine? DIPHENHYDRAMINE (dye fen HYE dra meen) is an antihistamine. It is used to treat the symptoms of an allergic reaction and motion sickness. It is also used to treat Parkinson's disease. This medicine may be used for other purposes; ask your health care provider or pharmacist if you have questions. COMMON BRAND NAME(S): Benadryl What should I tell my health care provider before I take this medicine? They need to know if you have any of these conditions:  asthma or lung disease  glaucoma  high blood pressure or heart disease  liver disease  pain or difficulty passing urine  prostate trouble  ulcers or other stomach problems  an unusual or allergic reaction to diphenhydramine, antihistamines, other medicines foods, dyes, or preservatives  pregnant or trying to get  pregnant  breast-feeding How should I use this medicine? This medicine is for injection into a vein or a muscle. It is usually given by a health care professional in a hospital or clinic setting. If you get this medicine at home, you will be taught how to prepare and give this medicine. Use exactly as directed. Take your medicine at regular intervals. Do not take your medicine more often than directed. It is important that you put your used needles and syringes in a special sharps container. Do not put them in a trash can. If you do not have a sharps container, call your pharmacist or healthcare provider to get one. Talk to your pediatrician regarding the use of this medicine in children. While this drug may be prescribed for selected conditions, precautions do apply. This medicine is not approved for use in newborns and premature babies. Patients over 81 years old may have a stronger reaction and need a smaller dose. Overdosage: If you think you have taken too much of this medicine contact a poison control center or emergency room at once. NOTE: This medicine is only for you. Do not share this medicine with others. What if I miss a dose? If you miss a dose, take it as soon as you can. If it is almost time for your next dose, take only that dose. Do not take double or extra doses. What may interact with this medicine? Do not take this medicine with any of the following medications:  MAOIs like Carbex, Eldepryl, Marplan, Nardil, and Parnate This medicine may also interact with the following medications:  alcohol  barbiturates, like phenobarbital  medicines for  bladder spasm like oxybutynin, tolterodine  medicines for blood pressure  medicines for depression, anxiety, or psychotic disturbances  medicines for movement abnormalities or Parkinson's disease  medicines for sleep  other medicines for cold, cough or allergy  some medicines for the stomach like chlordiazepoxide,  dicyclomine This list may not describe all possible interactions. Give your health care provider a list of all the medicines, herbs, non-prescription drugs, or dietary supplements you use. Also tell them if you smoke, drink alcohol, or use illegal drugs. Some items may interact with your medicine. What should I watch for while using this medicine? Your condition will be monitored carefully while you are receiving this medicine. Tell your doctor or healthcare professional if your symptoms do not start to get better or if they get worse. You may get drowsy or dizzy. Do not drive, use machinery, or do anything that needs mental alertness until you know how this medicine affects you. Do not stand or sit up quickly, especially if you are an older patient. This reduces the risk of dizzy or fainting spells. Alcohol may interfere with the effect of this medicine. Avoid alcoholic drinks. Your mouth may get dry. Chewing sugarless gum or sucking hard candy, and drinking plenty of water may help. Contact your doctor if the problem does not go away or is severe. What side effects may I notice from receiving this medicine? Side effects that you should report to your doctor or health care professional as soon as possible:  allergic reactions like skin rash, itching or hives, swelling of the face, lips, or tongue  breathing problems  changes in vision  chills  confused, agitated, nervous  irregular or fast heartbeat  low blood pressure  seizures  tremor  trouble passing urine  unusual bleeding or bruising  unusually weak or tired Side effects that usually do not require medical attention (report to your doctor or health care professional if they continue or are bothersome):  constipation, diarrhea  drowsy  headache  loss of appetite  stomach upset, vomiting  sweating  thick mucous This list may not describe all possible side effects. Call your doctor for medical advice about side  effects. You may report side effects to FDA at 1-800-FDA-1088. Where should I keep my medicine? Keep out of the reach of children. If you are using this medicine at home, you will be instructed on how to store this medicine. Throw away any unused medicine after the expiration date on the label. NOTE: This sheet is a summary. It may not cover all possible information. If you have questions about this medicine, talk to your doctor, pharmacist, or health care provider.  2020 Elsevier/Gold Standard (2007-09-07 14:28:35)

## 2019-12-06 NOTE — Assessment & Plan Note (Signed)
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

## 2019-12-06 NOTE — Assessment & Plan Note (Signed)
Her TSH has improved since introduction of Synthroid We will continue to adjust the dose as needed  

## 2019-12-06 NOTE — Progress Notes (Signed)
Per Dr. Alvy Bimler, ok for treatment today with HR 118. Pt. denies chest pain, dizziness, and no shortness of breath noted. Pt. has a red raised rash on left arm and chest. States it is itching. Pt. states she is not sure if she had it before she came in, but thinks it was before she received the Zofran. Sandi Mealy PA notified and in for observation, new orders received. Pt. states  good relief from Zofran and Benadryl, denies nausea and denies itching.

## 2019-12-06 NOTE — Progress Notes (Signed)
[place] OFFICE PROGRESS NOTE  Patient Care Team: [name], DO as PCP - General (Family Medicine) [name], MD as Consulting Physician (Ophthalmology) [name], DPM as Consulting Physician (Podiatry) [name], [name], MD as Referring Physician (Optometry) [name], MD (Inactive) as Consulting Physician (Orthopedic Surgery) [name], RN as Oncology Nurse Navigator (Oncology)  ASSESSMENT & PLAN:  Uterine cancer [facility]) Her recent imaging studies showed she has stable disease control However, she developed some diarrhea recently that could be infectious in etiology versus 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 of immunotherapy She felt better when we resume Lenvima at every other day However, if she does not feel better, we will hold pembrolizumab in her next visit In the meantime, we will continue aggressive supportive care   Abnormal thyroid function test Her TSH has improved since introduction of Synthroid We will continue to adjust the dose as needed   CKD (chronic kidney disease), stage III ([place]) Her serum creatinine is satisfactory We discussed the importance of adequate hydration and risk factor modification   No orders of the defined types were placed in this encounter.   All questions were answered. The patient knows to call the clinic with any problems, questions or concerns. The total time spent in the appointment was 20 minutes encounter with patients including review of chart and various tests results, discussions about plan of care and coordination of care plan   [name], MD 12/06/2019 8:55 AM  INTERVAL HISTORY: Please see below for problem oriented charting. She returns for treatment and follow-up Her diarrhea has resolved Her lower extremity edema has improved and the leg wound has resolved She is wearing elastic compression hose Her appetite is fair Her weight is stable Overall, she tolerated reduced dose Lenvima  better The patient denies any recent signs or symptoms of bleeding such as spontaneous epistaxis, hematuria or hematochezia.   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 Ultrasound irregular hypoechoic mass 7 x 6 x 5 mm   01/06/2012 Initial Biopsy   Initial biopsy was benign . 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 Cervix, biopsy - HIGH GRADE CARCINOMA. - SEE COMMENT. 2. Endocervix, curettage - HIGH GRADE CARCINOMA. - SEE COMMENT. 3. Endometrium, biopsy - HIGH GRADE CARCINOMA. - SEE COMMENT. Microscopic Comment 1. - 3. The carcinoma in the three specimens is morphologically similar and has features suggestive of papillary serous carcinoma.   07/26/2018 Tumor Marker   Patient's tumor was tested for the following markers: CA-125 Results of the tumor marker test revealed 835  07/28/2018 Imaging   1. Several (at least 10) solid pulmonary nodules scattered throughout both lungs are new since 2012 chest CT, likely representing pulmonary metastases, largest 6 mm, below PET resolution. 2. Redemonstration  of hypodense 2.5 cm segment 7 right liver lobe mass, indeterminate, suspicious for liver metastasis. 3. 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 4. Three-vessel coronary atherosclerosis.  Aortic Atherosclerosis (ICD10-I70.0).   08/03/2018 Cancer Staging   Staging form: Corpus Uteri - Carcinoma and Carcinosarcoma, AJCC 8th Edition - Clinical: FIGO Stage IVB (cT3b, cN2, cM1) - Signed by [name], MD on 08/03/2018   08/05/2018 Tumor Marker   Patient's tumor was tested for the following markers: CA-125 Results of the tumor marker test revealed 901   08/06/2018 Procedure   Placement of single lumen port a cath via right internal jugular vein. The catheter tip lies at the cavo-atrial junction. A power injectable port a cath was placed and is ready for immediate use.    08/11/2018 - 01/12/2019 Chemotherapy   The patient had carboplatin and taxol x 8 cycles   09/01/2018 Tumor Marker   Patient's tumor was tested for the following markers: CA-125 Results of the tumor marker test revealed 406   09/09/2018 Genetic Testing   The Common Hereditary Cancers Panel offered by [facility] includes sequencing and/or deletion duplication testing of the following 48 genes: APC, ATM, AXIN2, BARD1, BMPR1A, BRCA1, BRCA2, BRIP1, CDH1, CDKN2A (p14ARF), CDKN2A (p16INK4a), CKD4, CHEK2, CTNNA1, DICER1, EPCAM (Deletion/duplication testing only), GREM1 (promoter region deletion/duplication testing only), KIT, MEN1, MLH1, MSH2, MSH3, MSH6, MUTYH, NBN, NF1, NHTL1, PALB2, PDGFRA, PMS2, POLD1, POLE, PTEN, RAD50, RAD51C, RAD51D, RNF43, SDHB, SDHC, SDHD, SMAD4, SMARCA4. STK11, TP53, TSC1, TSC2, and VHL.  The following genes were evaluated for sequence changes only: SDHA and HOXB13 c.251G>A variant only.  Results: Negative, no pathogenic variants identified. The report date is 09/09/2018.     09/22/2018 Tumor Marker   Patient's tumor was tested for the following markers: CA-125 Results of the tumor marker test  revealed 110   10/12/2018 Tumor Marker   Patient's tumor was tested for the following markers: CA-125 Results of the tumor marker test revealed 55.3   10/12/2018 Imaging   1. Mild decrease in pulmonary metastases, liver metastases, and metastatic abdominal and pelvic lymphadenopathy. 2. Decreased size of complex cystic lesion in left presacral region. 3. No new or progressive metastatic disease identified. 4. Stable small right inguinal hernia containing small portion of urinary bladder.   11/03/2018 Tumor Marker   Patient's tumor was tested for the following markers: CA-125 Results of the tumor marker test revealed 34.3    Genetic Testing   Patient has genetic testing done for HER2. Results revealed patient has the following: HER2 - negative from pathology from 07/26/18.   12/20/2018 Imaging   1. Possible new omental nodule. 2. Improving abdominal and pelvic retroperitoneal adenopathy. 3. Stable pulmonary and hepatic metastatic disease. 4. Left perirectal/presacral fluid collection has decreased in size. 5. Right inguinal hernia contains a small portion of the bladder, as before. 6. Aortic atherosclerosis (ICD10-170.0). Coronary artery calcification   12/21/2018 Tumor Marker   Patient's tumor was tested for the following markers: CA-125 Results of the tumor marker test revealed 15.6   01/15/2019 Imaging   Ct abdomen and pelvis 1. Small hiatal hernia with fluid in the distal esophagus, which may represent reflux or esophagitis. No other acute findings. 2. Right inguinal hernia contains anterolateral aspect of the urinary bladder, unchanged in appearance from prior. Mild edema within the hernia  sac is similar. 3. Unchanged small low-density lesions in the liver. 4. Unchanged retroperitoneal and bilateral iliac lymph nodes. Small ventral abdominal omental nodule tentatively identified and unchanged from prior.   Aortic Atherosclerosis (ICD10-I70.0).   02/02/2019 Tumor Marker    Patient's tumor was tested for the following markers: CA-125 Results of the tumor marker test revealed 7.8   03/22/2019 Surgery   Preoperative diagnosis: History of incarcerated right inguinal hernia   Postop diagnosis: Incarcerated right inguinal hernia with preperitoneal fat without strangulation or obstruction   Procedure: Repair of right inguinal hernia with ultra Pro hernia system mesh   Surgeon: Erroll Luna, MD   03/31/2019 Tumor Marker   Patient's tumor was tested for the following markers: CA-`125 Results of the tumor marker test revealed 24.4.   05/25/2019 Tumor Marker   Patient's tumor was tested for the following markers: CA-125 Results of the tumor marker test revealed 287   05/25/2019 Imaging   New solid bilateral adnexal masses, consistent with metastatic disease.   New peritoneal soft tissue nodules in the anterior right pelvis, suspicious for peritoneal carcinomatosis. No evidence of ascites.   No significant change in shotty sub-cm abdominal retroperitoneal and bilateral iliac lymph nodes.   06/09/2019 -  Chemotherapy   The patient had pembrolizumab (KEYTRUDA) 200 mg in sodium chloride 0.9 % 50 mL chemo infusion, 200 mg, Intravenous, Once, 8 of 10 cycles Administration: 200 mg (06/09/2019), 200 mg (07/26/2019), 200 mg (08/16/2019), 200 mg (10/04/2019), 200 mg (06/30/2019), 200 mg (09/06/2019), 200 mg (10/25/2019), 200 mg (11/15/2019)  for chemotherapy treatment.    06/09/2019 Tumor Marker   Patient's tumor was tested for the following markers: CA-125 Results of the tumor marker test revealed 468   07/26/2019 Tumor Marker   Patient's tumor was tested for the following markers: CA-125 Results of the tumor marker test revealed 282   08/17/2019 Tumor Marker   Patient's tumor was tested for the following markers: CA-125 Results of the tumor marker test revealed 190.   09/05/2019 Imaging   1. Subtle nodularity along the transverse mesocolon measuring 11 mm is concerning for  peritoneal carcinomatosis. Also associated with perihepatic ascites and enlarging juxta diaphragmatic nodal disease.  Attention on follow-up. 2. Bilateral adnexal masses are similar in size to the previous exam. 3. Left para-aortic lymph node is slightly increased in size from the previous exam, slight interval enlargement is suspicious based on location for nodal involvement. Scattered subcentimeter lymph nodes throughout the left and right pelvis are similar to the previous exam. 4. Calcified coronary artery disease. 5. Low-density lesions in the liver are unchanged. 6. Persistent endometrial thickening not well assessed on CT. 7. Stranding in the right inguinal region tracking towards the abdominal wall likely related to prior inguinal hernia repair is unchanged. Area of discrete nodularity in this location is no longer seen.   Aortic Atherosclerosis (ICD10-I70.0).       09/07/2019 Tumor Marker   Patient's tumor was tested for the following markers: CA-125 Results of the tumor marker test revealed 214   11/08/2019 Imaging   1. Multiple mild-to-moderate severity thickened small bowel loops throughout the abdomen with associated peri intestinal inflammatory fat stranding. While this may represent an infectious or inflammatory enteritis, the presence of an underlying neoplastic process cannot be excluded. 2. Large amount of perihepatic fluid. 3. Stable bilateral adnexal masses, likely ovarian in origin. Correlation with pelvic ultrasound is recommended. 4. Small foci of parenchymal low attenuation within the right lobe of the liver which may represent small  cysts or hemangiomas. 5. Stable left renal cyst. 6. Aortic atherosclerosis.   Aortic Atherosclerosis (ICD10-I70.0).     11/16/2019 Tumor Marker   Patient's tumor was tested for the following markers: CA-125 Results of the tumor marker test revealed 456     REVIEW OF SYSTEMS:   Constitutional: Denies fevers, chills or abnormal weight  loss Eyes: Denies blurriness of vision Ears, nose, mouth, throat, and face: Denies mucositis or sore throat Respiratory: Denies cough, dyspnea or wheezes Cardiovascular: Denies palpitation, chest discomfort or lower extremity swelling Gastrointestinal:  Denies nausea, heartburn or change in bowel habits Skin: Denies abnormal skin rashes Lymphatics: Denies new lymphadenopathy or easy bruising Neurological:Denies numbness, tingling or new weaknesses Behavioral/Psych: Mood is stable, no new changes  All other systems were reviewed with the patient and are negative.  I have reviewed the past medical history, past surgical history, social history and family history with the patient and they are unchanged from previous note.  ALLERGIES:  has No Known Allergies.  MEDICATIONS:  Current Outpatient Medications  Medication Sig Dispense Refill  . acetaminophen (TYLENOL) 500 MG tablet Take 1 tablet (500 mg total) by mouth every 6 (six) hours as needed. (Patient taking differently: Take 1,000 mg by mouth every 6 (six) hours as needed for moderate pain. ) 30 tablet 3  . Calcium Carbonate (CALTRATE 600 PO) Take 1 tablet by mouth 2 (two) times daily.     . enalapril (VASOTEC) 20 MG tablet Take 1 tablet (20 mg total) by mouth daily. 90 tablet 1  . Fluticasone-Salmeterol (ADVAIR DISKUS) 100-50 MCG/DOSE AEPB USE 1 INHALATION BY MOUTH TWICE DAILY 60 each 2  . furosemide (LASIX) 40 MG tablet TAKE 1 TABLET BY MOUTH DAILY AS NEEDED FOR LEG SWELLING (Patient taking differently: Take 40 mg by mouth daily as needed (leg swelling). ) 90 tablet 2  . lenvatinib 10 mg daily dose (LENVIMA) capsule Take 10 mg every other day by mouth 30 capsule 11  . levothyroxine (SYNTHROID) 100 MCG tablet Take 1 tablet (100 mcg total) by mouth daily before breakfast. 30 tablet 11  . lidocaine-prilocaine (EMLA) cream Apply to affected area once (Patient taking differently: Apply 1 application topically daily as needed. Apply to affected  area once) 30 g 3  . Multiple Vitamin (MULTIVITAMIN) tablet Take 1 tablet by mouth daily.    . Multiple Vitamins-Minerals (ICAPS AREDS 2 PO) Take 1 capsule by mouth 2 (two) times daily.    . ondansetron (ZOFRAN) 8 MG tablet Take 1 tablet (8 mg total) by mouth every 8 (eight) hours as needed. Start on the third day after chemotherapy. 30 tablet 1  . oxyCODONE (OXY IR/ROXICODONE) 5 MG immediate release tablet Take 1 tablet (5 mg total) by mouth every 6 (six) hours as needed for severe pain. 60 tablet 0  . polyethylene glycol (MIRALAX / GLYCOLAX) 17 g packet Take 17 g by mouth daily as needed for moderate constipation.    . prochlorperazine (COMPAZINE) 10 MG tablet Take 1 tablet (10 mg total) by mouth every 6 (six) hours as needed (Nausea or vomiting). 60 tablet 1  . rivaroxaban (XARELTO) 20 MG TABS tablet Take 1 tablet (20 mg total) by mouth daily with supper. 90 tablet 3  . rosuvastatin (CRESTOR) 10 MG tablet Take 1 tablet (10 mg total) by mouth daily. Start with 64m for the first week. 90 tablet 0  . Vitamin D, Cholecalciferol, 1000 units CAPS Take 1,000 Units by mouth daily.      No current facility-administered medications  for this visit.    PHYSICAL EXAMINATION: ECOG PERFORMANCE STATUS: 2 - Symptomatic, <50% confined to bed GENERAL:alert, no distress and comfortable SKIN: skin color, texture, turgor are normal, no rashes or significant lesions EYES: normal, Conjunctiva are pink and non-injected, sclera clear OROPHARYNX:no exudate, no erythema and lips, buccal mucosa, and tongue normal  NECK: supple, thyroid normal size, non-tender, without nodularity LYMPH:  no palpable lymphadenopathy in the cervical, axillary or inguinal LUNGS: clear to auscultation and percussion with normal breathing effort HEART: regular rate & rhythm and no murmurs with mild lower extremity edema ABDOMEN:abdomen soft, non-tender and normal bowel sounds Musculoskeletal:no cyanosis of digits and no clubbing  NEURO:  alert & oriented x 3 with fluent speech, no focal motor/sensory deficits  LABORATORY DATA:  I have reviewed the data as listed    Component Value Date/Time   NA 136 11/15/2019 1102   NA 142 12/25/2014 1122   K 4.1 11/15/2019 1102   K 4.1 12/25/2014 1122   CL 103 11/15/2019 1102   CL 102 10/04/2012 1159   CO2 22 11/15/2019 1102   CO2 29 12/25/2014 1122   GLUCOSE 86 11/15/2019 1102   GLUCOSE 99 12/25/2014 1122   GLUCOSE 116 (H) 10/04/2012 1159   BUN 9 11/15/2019 1102   BUN 13.7 12/25/2014 1122   CREATININE 0.90 11/15/2019 1102   CREATININE 0.96 (H) 02/27/2016 1530   CREATININE 0.9 12/25/2014 1122   CALCIUM 9.0 11/15/2019 1102   CALCIUM 10.1 12/25/2014 1122   PROT 7.1 11/15/2019 1102   PROT 7.8 12/25/2014 1122   ALBUMIN 3.1 (L) 11/15/2019 1102   ALBUMIN 3.4 (L) 12/25/2014 1122   AST 30 11/15/2019 1102   AST 21 12/25/2014 1122   ALT 13 11/15/2019 1102   ALT 17 12/25/2014 1122   ALKPHOS 65 11/15/2019 1102   ALKPHOS 99 12/25/2014 1122   BILITOT 0.6 11/15/2019 1102   BILITOT 0.41 12/25/2014 1122   GFRNONAA >60 11/15/2019 1102   GFRNONAA 58 (L) 02/27/2016 1530   GFRAA >60 11/15/2019 1102   GFRAA 66 02/27/2016 1530    No results found for: SPEP, UPEP  Lab Results  Component Value Date   WBC 5.7 12/06/2019   NEUTROABS 3.6 12/06/2019   HGB 11.3 (L) 12/06/2019   HCT 33.8 (L) 12/06/2019   MCV 101.8 (H) 12/06/2019   PLT 171 12/06/2019      Chemistry      Component Value Date/Time   NA 136 11/15/2019 1102   NA 142 12/25/2014 1122   K 4.1 11/15/2019 1102   K 4.1 12/25/2014 1122   CL 103 11/15/2019 1102   CL 102 10/04/2012 1159   CO2 22 11/15/2019 1102   CO2 29 12/25/2014 1122   BUN 9 11/15/2019 1102   BUN 13.7 12/25/2014 1122   CREATININE 0.90 11/15/2019 1102   CREATININE 0.96 (H) 02/27/2016 1530   CREATININE 0.9 12/25/2014 1122      Component Value Date/Time   CALCIUM 9.0 11/15/2019 1102   CALCIUM 10.1 12/25/2014 1122   ALKPHOS 65 11/15/2019 1102   ALKPHOS  99 12/25/2014 1122   AST 30 11/15/2019 1102   AST 21 12/25/2014 1122   ALT 13 11/15/2019 1102   ALT 17 12/25/2014 1122   BILITOT 0.6 11/15/2019 1102   BILITOT 0.41 12/25/2014 1122

## 2019-12-06 NOTE — Patient Instructions (Signed)

## 2019-12-06 NOTE — Assessment & Plan Note (Signed)
Her serum creatinine is satisfactory We discussed the importance of adequate hydration and risk factor modification 

## 2019-12-08 LAB — T4: T4, Total: 6.9 ug/dL (ref 4.5–12.0)

## 2019-12-08 LAB — CA 125: Cancer Antigen (CA) 125: 676 U/mL — ABNORMAL HIGH (ref 0.0–38.1)

## 2019-12-08 NOTE — Progress Notes (Signed)
    DATE:  12/06/2019                                          X  CHEMO/IMMUNOTHERAPY REACTION            MD:  Dr. [name]   AGENT/BLOOD PRODUCT RECEIVING TODAY:              Keytruda   AGENT/BLOOD PRODUCT RECEIVING IMMEDIATELY PRIOR TO REACTION:           Zofran   REACTION(S):            Hives   PREMEDS:      The patient reported that she was having nausea prior to receiving Keytruda.  She was given Zofran 8 mg IV x1 and subsequently developed hives on her left upper extremity and left chest.   INTERVENTION: Benadryl 25 mg IV x1   Review of Systems  Review of Systems  Constitutional: Negative for chills, diaphoresis and fever.  HENT: Negative for trouble swallowing and voice change.   Respiratory: Negative for cough, chest tightness, shortness of breath and wheezing.   Cardiovascular: Negative for chest pain and palpitations.  Gastrointestinal: Negative for abdominal pain, constipation, diarrhea, nausea and vomiting.  Musculoskeletal: Negative for back pain and myalgias.  Skin: Positive for color change and rash.  Neurological: Negative for dizziness, light-headedness and headaches.     Physical Exam  Physical Exam Constitutional:      General: She is not in acute distress.    Appearance: Normal appearance. She is not ill-appearing.  HENT:     Head: Normocephalic and atraumatic.  Skin:    General: Skin is warm and dry.     Findings: Erythema and rash present.     Comments: The patient was noted to have wheals on her left anterior elbow and left chest.  Neurological:     Mental Status: She is alert.  Psychiatric:        Mood and Affect: Mood normal.        Behavior: Behavior normal.        Thought Content: Thought content normal.        Judgment: Judgment normal.     OUTCOME:                 The patient's hives abated after she was given Benadryl 25 mg IV x1.  She was able to proceed on with dosing with Keytruda with no further issues of concern.   Sandi Mealy,  MHS, PA-C

## 2019-12-19 MED FILL — LENVIMA 10 MG DAILY DOSE: 10 | 30 days supply | Qty: 15 | Fill #0

## 2019-12-27 ENCOUNTER — Other Ambulatory Visit: Payer: Self-pay

## 2019-12-27 ENCOUNTER — Inpatient Hospital Stay: Payer: Medicare Other

## 2019-12-27 ENCOUNTER — Inpatient Hospital Stay (HOSPITAL_BASED_OUTPATIENT_CLINIC_OR_DEPARTMENT_OTHER): Payer: Medicare Other | Admitting: Hematology and Oncology

## 2019-12-27 ENCOUNTER — Other Ambulatory Visit: Payer: Self-pay | Admitting: Hematology and Oncology

## 2019-12-27 ENCOUNTER — Encounter: Payer: Self-pay | Admitting: Hematology and Oncology

## 2019-12-27 ENCOUNTER — Telehealth: Payer: Self-pay

## 2019-12-27 DIAGNOSIS — T451X5A Adverse effect of antineoplastic and immunosuppressive drugs, initial encounter: Secondary | ICD-10-CM

## 2019-12-27 DIAGNOSIS — R946 Abnormal results of thyroid function studies: Secondary | ICD-10-CM | POA: Diagnosis not present

## 2019-12-27 DIAGNOSIS — C55 Malignant neoplasm of uterus, part unspecified: Secondary | ICD-10-CM

## 2019-12-27 DIAGNOSIS — D6481 Anemia due to antineoplastic chemotherapy: Secondary | ICD-10-CM

## 2019-12-27 DIAGNOSIS — D631 Anemia in chronic kidney disease: Secondary | ICD-10-CM | POA: Diagnosis not present

## 2019-12-27 DIAGNOSIS — Z5112 Encounter for antineoplastic immunotherapy: Secondary | ICD-10-CM | POA: Diagnosis not present

## 2019-12-27 DIAGNOSIS — N183 Chronic kidney disease, stage 3 unspecified: Secondary | ICD-10-CM | POA: Diagnosis not present

## 2019-12-27 LAB — CBC WITH DIFFERENTIAL (CANCER CENTER ONLY)
Abs Immature Granulocytes: 0.04 10*3/uL (ref 0.00–0.07)
Basophils Absolute: 0 10*3/uL (ref 0.0–0.1)
Basophils Relative: 0 %
Eosinophils Absolute: 1.7 10*3/uL — ABNORMAL HIGH (ref 0.0–0.5)
Eosinophils Relative: 18 %
HCT: 33.5 % — ABNORMAL LOW (ref 36.0–46.0)
Hemoglobin: 11.3 g/dL — ABNORMAL LOW (ref 12.0–15.0)
Immature Granulocytes: 0 %
Lymphocytes Relative: 13 %
Lymphs Abs: 1.2 10*3/uL (ref 0.7–4.0)
MCH: 35.3 pg — ABNORMAL HIGH (ref 26.0–34.0)
MCHC: 33.7 g/dL (ref 30.0–36.0)
MCV: 104.7 fL — ABNORMAL HIGH (ref 80.0–100.0)
Monocytes Absolute: 0.8 10*3/uL (ref 0.1–1.0)
Monocytes Relative: 9 %
Neutro Abs: 5.5 10*3/uL (ref 1.7–7.7)
Neutrophils Relative %: 60 %
Platelet Count: 200 10*3/uL (ref 150–400)
RBC: 3.2 MIL/uL — ABNORMAL LOW (ref 3.87–5.11)
RDW: 13.4 % (ref 11.5–15.5)
WBC Count: 9.2 10*3/uL (ref 4.0–10.5)
nRBC: 0 % (ref 0.0–0.2)

## 2019-12-27 LAB — CMP (CANCER CENTER ONLY)
ALT: 8 U/L (ref 0–44)
AST: 29 U/L (ref 15–41)
Albumin: 3.2 g/dL — ABNORMAL LOW (ref 3.5–5.0)
Alkaline Phosphatase: 79 U/L (ref 38–126)
Anion gap: 8 (ref 5–15)
BUN: 10 mg/dL (ref 8–23)
CO2: 27 mmol/L (ref 22–32)
Calcium: 10.4 mg/dL — ABNORMAL HIGH (ref 8.9–10.3)
Chloride: 102 mmol/L (ref 98–111)
Creatinine: 0.91 mg/dL (ref 0.44–1.00)
GFR, Est AFR Am: >60 mL/min (ref >60)
GFR, Estimated: 60 mL/min — ABNORMAL LOW (ref >60)
Glucose, Bld: 87 mg/dL (ref 70–99)
Potassium: 4.3 mmol/L (ref 3.5–5.1)
Sodium: 137 mmol/L (ref 135–145)
Total Bilirubin: 0.4 mg/dL (ref 0.3–1.2)
Total Protein: 7.3 g/dL (ref 6.5–8.1)

## 2019-12-27 LAB — TSH: TSH: 11.027 u[IU]/mL — ABNORMAL HIGH (ref 0.308–3.960)

## 2019-12-27 LAB — TOTAL PROTEIN, URINE DIPSTICK: Protein, ur: NEGATIVE mg/dL

## 2019-12-27 MED ORDER — SODIUM CHLORIDE 0.9 % IV SOLN
Freq: Once | INTRAVENOUS | Status: AC
  Filled 2019-12-27: qty 250

## 2019-12-27 MED ORDER — SODIUM CHLORIDE 0.9 % IV SOLN
200.0000 mg | Freq: Once | INTRAVENOUS | Status: AC
  Administered 2019-12-27: 200 mg via INTRAVENOUS
  Filled 2019-12-27: qty 8

## 2019-12-27 MED ORDER — HEPARIN SOD (PORK) LOCK FLUSH 100 UNIT/ML IV SOLN
500.0000 [IU] | Freq: Once | INTRAVENOUS | Status: AC | PRN
  Administered 2019-12-27: 500 [IU]
  Filled 2019-12-27: qty 5

## 2019-12-27 MED ORDER — LEVOTHYROXINE SODIUM 112 MCG PO TABS: 112.0000 ug | ORAL_TABLET | Freq: Every day | ORAL | 11 refills | Status: DC

## 2019-12-27 MED ORDER — SODIUM CHLORIDE 0.9% FLUSH
10.0000 mL | Freq: Once | INTRAVENOUS | Status: AC
  Administered 2019-12-27: 10 mL
  Filled 2019-12-27: qty 10

## 2019-12-27 MED ORDER — SODIUM CHLORIDE 0.9% FLUSH
10.0000 mL | INTRAVENOUS | Status: DC | PRN
  Administered 2019-12-27: 10 mL
  Filled 2019-12-27: qty 10

## 2019-12-27 NOTE — Assessment & Plan Note (Signed)
She has chronic baseline anemia due to chronic kidney disease She is mildly symptomatic Recommend observation only for now 

## 2019-12-27 NOTE — Assessment & Plan Note (Signed)
Her recent imaging studies showed she has stable disease control Her symptoms are improving and she have less frequent diarrhea since we changed her lenvatinib to every other day She will continue pembrolizumab without delay

## 2019-12-27 NOTE — Telephone Encounter (Signed)
-----   Message from [name], MD sent at 12/27/2019  3:57 PM EDT ----- Regarding: her TSH is still high, I sent new prescription to pharmacy, increasing from 100 to 112

## 2019-12-27 NOTE — Telephone Encounter (Signed)
Called and spoke with daughter. Given below message. She verbalized understanding. She will pick up new Rx.

## 2019-12-27 NOTE — Patient Instructions (Addendum)
[place] Discharge Instructions for Patients Receiving Chemotherapy  Today you received the following immunotherapy agent: Pembrolizumab  To help prevent nausea and vomiting after your treatment, we encourage you to take your nausea medication as directed by your MD.   If you develop nausea and vomiting that is not controlled by your nausea medication, call the clinic.   BELOW ARE SYMPTOMS THAT SHOULD BE REPORTED IMMEDIATELY:  *FEVER GREATER THAN 100.5 F  *CHILLS WITH OR WITHOUT FEVER  NAUSEA AND VOMITING THAT IS NOT CONTROLLED WITH YOUR NAUSEA MEDICATION  *UNUSUAL SHORTNESS OF BREATH  *UNUSUAL BRUISING OR BLEEDING  TENDERNESS IN MOUTH AND THROAT WITH OR WITHOUT PRESENCE OF ULCERS  *URINARY PROBLEMS  *BOWEL PROBLEMS  UNUSUAL RASH Items with * indicate a potential emergency and should be followed up as soon as possible.  Feel free to call the clinic should you have any questions or concerns. The clinic phone number is [phone].  Please show the [place] at check-in to the Emergency Department and triage nurse.

## 2019-12-27 NOTE — Progress Notes (Signed)
[place] OFFICE PROGRESS NOTE  Patient Care Team: [name], DO as PCP - General (Family Medicine) [name], MD as Consulting Physician (Ophthalmology) [name], DPM as Consulting Physician (Podiatry) [name], [name], MD as Referring Physician (Optometry) [name], MD (Inactive) as Consulting Physician (Orthopedic Surgery) [name], RN as Oncology Nurse Navigator (Oncology)  ASSESSMENT & PLAN:  Uterine cancer [facility]) Her recent imaging studies showed she has stable disease control Her symptoms are improving and she have less frequent diarrhea since we changed her lenvatinib to every other day She will continue pembrolizumab without delay  Abnormal thyroid function test Her TSH has improved since introduction of Synthroid We will continue to adjust the dose as needed   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   All questions were answered. The patient knows to call the clinic with any problems, questions or concerns. The total time spent in the appointment was 20 minutes encounter with patients including review of chart and various tests results, discussions about plan of care and coordination of care plan   [name], MD 12/27/2019 4:54 PM  INTERVAL HISTORY: Please see below for problem oriented charting. She is seen for further follow-up She tolerated reduced dose lenvatinib better Her appetite is better and she have less frequent loose stools Denies pain No recent infection, fever or chills No infusion reaction The patient denies any recent signs or symptoms of bleeding such as spontaneous epistaxis, hematuria or hematochezia.   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 Ultrasound irregular hypoechoic mass 7 x 6 x 5 mm   01/06/2012 Initial Biopsy   Initial biopsy was benign . 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 Cervix, biopsy - HIGH GRADE CARCINOMA. - SEE COMMENT. 2. Endocervix, curettage - HIGH GRADE CARCINOMA. - SEE COMMENT. 3. Endometrium, biopsy - HIGH GRADE CARCINOMA. - SEE COMMENT. Microscopic Comment 1. - 3. The carcinoma in the three specimens is morphologically similar and has features suggestive of papillary serous carcinoma.   07/26/2018 Tumor Marker   Patient's tumor was tested for the following markers: CA-125 Results of the tumor marker test revealed 835   07/28/2018 Imaging   1. Several (at least 10) solid pulmonary nodules scattered throughout both lungs are new since 2012 chest CT, likely representing pulmonary metastases, largest 6 mm, below PET resolution. 2. Redemonstration of hypodense  2.5 cm segment 7 right liver lobe mass, indeterminate, suspicious for liver metastasis. 3. 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 4. Three-vessel coronary atherosclerosis.  Aortic  Atherosclerosis (ICD10-I70.0).   08/03/2018 Cancer Staging   Staging form: Corpus Uteri - Carcinoma and Carcinosarcoma, AJCC 8th Edition - Clinical: FIGO Stage IVB (cT3b, cN2, cM1) - Signed by [name], MD on 08/03/2018   08/05/2018 Tumor Marker   Patient's tumor was tested for the following markers: CA-125 Results of the tumor marker test revealed 901   08/06/2018 Procedure   Placement of single lumen port a cath via right internal jugular vein. The catheter tip lies at the cavo-atrial junction. A power injectable port a cath was placed and is ready for immediate use.    08/11/2018 - 01/12/2019 Chemotherapy   The patient had carboplatin and taxol x 8 cycles   09/01/2018 Tumor Marker   Patient's tumor was tested for the following markers: CA-125 Results of the tumor marker test revealed 406   09/09/2018 Genetic Testing   The Common Hereditary Cancers Panel offered by [facility] includes sequencing and/or deletion duplication testing of the following 48 genes: APC, ATM, AXIN2, BARD1, BMPR1A, BRCA1, BRCA2, BRIP1, CDH1, CDKN2A (p14ARF), CDKN2A (p16INK4a), CKD4, CHEK2, CTNNA1, DICER1, EPCAM (Deletion/duplication testing only), GREM1 (promoter region deletion/duplication testing only), KIT, MEN1, MLH1, MSH2, MSH3, MSH6, MUTYH, NBN, NF1, NHTL1, PALB2, PDGFRA, PMS2, POLD1, POLE, PTEN, RAD50, RAD51C, RAD51D, RNF43, SDHB, SDHC, SDHD, SMAD4, SMARCA4. STK11, TP53, TSC1, TSC2, and VHL.  The following genes were evaluated for sequence changes only: SDHA and HOXB13 c.251G>A variant only.  Results: Negative, no pathogenic variants identified. The report date is 09/09/2018.     09/22/2018 Tumor Marker   Patient's tumor was tested for the following markers: CA-125 Results of the tumor marker test revealed 110   10/12/2018 Tumor Marker   Patient's tumor was tested for the following markers: CA-125 Results of the tumor marker test revealed 55.3   10/12/2018 Imaging   1. Mild decrease in pulmonary metastases, liver  metastases, and metastatic abdominal and pelvic lymphadenopathy. 2. Decreased size of complex cystic lesion in left presacral region. 3. No new or progressive metastatic disease identified. 4. Stable small right inguinal hernia containing small portion of urinary bladder.   11/03/2018 Tumor Marker   Patient's tumor was tested for the following markers: CA-125 Results of the tumor marker test revealed 34.3    Genetic Testing   Patient has genetic testing done for HER2. Results revealed patient has the following: HER2 - negative from pathology from 07/26/18.   12/20/2018 Imaging   1. Possible new omental nodule. 2. Improving abdominal and pelvic retroperitoneal adenopathy. 3. Stable pulmonary and hepatic metastatic disease. 4. Left perirectal/presacral fluid collection has decreased in size. 5. Right inguinal hernia contains a small portion of the bladder, as before. 6. Aortic atherosclerosis (ICD10-170.0). Coronary artery calcification   12/21/2018 Tumor Marker   Patient's tumor was tested for the following markers: CA-125 Results of the tumor marker test revealed 15.6   01/15/2019 Imaging   Ct abdomen and pelvis 1. Small hiatal hernia with fluid in the distal esophagus, which may represent reflux or esophagitis. No other acute findings. 2. Right inguinal hernia contains anterolateral aspect of the urinary bladder, unchanged in appearance from prior. Mild edema within the hernia sac is similar. 3. Unchanged small low-density lesions in the liver. 4. Unchanged retroperitoneal and bilateral iliac lymph nodes. Small ventral abdominal omental nodule tentatively identified and unchanged from prior.   Aortic Atherosclerosis (ICD10-I70.0).  02/02/2019 Tumor Marker   Patient's tumor was tested for the following markers: CA-125 Results of the tumor marker test revealed 7.8   03/22/2019 Surgery   Preoperative diagnosis: History of incarcerated right inguinal hernia   Postop diagnosis:  Incarcerated right inguinal hernia with preperitoneal fat without strangulation or obstruction   Procedure: Repair of right inguinal hernia with ultra Pro hernia system mesh   Surgeon: Erroll Luna, MD   03/31/2019 Tumor Marker   Patient's tumor was tested for the following markers: CA-`125 Results of the tumor marker test revealed 24.4.   05/25/2019 Tumor Marker   Patient's tumor was tested for the following markers: CA-125 Results of the tumor marker test revealed 287   05/25/2019 Imaging   New solid bilateral adnexal masses, consistent with metastatic disease.   New peritoneal soft tissue nodules in the anterior right pelvis, suspicious for peritoneal carcinomatosis. No evidence of ascites.   No significant change in shotty sub-cm abdominal retroperitoneal and bilateral iliac lymph nodes.   06/09/2019 -  Chemotherapy   The patient had pembrolizumab (KEYTRUDA) 200 mg in sodium chloride 0.9 % 50 mL chemo infusion, 200 mg, Intravenous, Once, 10 of 12 cycles Administration: 200 mg (06/09/2019), 200 mg (07/26/2019), 200 mg (08/16/2019), 200 mg (10/04/2019), 200 mg (06/30/2019), 200 mg (09/06/2019), 200 mg (10/25/2019), 200 mg (11/15/2019), 200 mg (12/06/2019), 200 mg (12/27/2019)  for chemotherapy treatment.    06/09/2019 Tumor Marker   Patient's tumor was tested for the following markers: CA-125 Results of the tumor marker test revealed 468   07/26/2019 Tumor Marker   Patient's tumor was tested for the following markers: CA-125 Results of the tumor marker test revealed 282   08/17/2019 Tumor Marker   Patient's tumor was tested for the following markers: CA-125 Results of the tumor marker test revealed 190.   09/05/2019 Imaging   1. Subtle nodularity along the transverse mesocolon measuring 11 mm is concerning for peritoneal carcinomatosis. Also associated with perihepatic ascites and enlarging juxta diaphragmatic nodal disease.  Attention on follow-up. 2. Bilateral adnexal masses are similar in  size to the previous exam. 3. Left para-aortic lymph node is slightly increased in size from the previous exam, slight interval enlargement is suspicious based on location for nodal involvement. Scattered subcentimeter lymph nodes throughout the left and right pelvis are similar to the previous exam. 4. Calcified coronary artery disease. 5. Low-density lesions in the liver are unchanged. 6. Persistent endometrial thickening not well assessed on CT. 7. Stranding in the right inguinal region tracking towards the abdominal wall likely related to prior inguinal hernia repair is unchanged. Area of discrete nodularity in this location is no longer seen.   Aortic Atherosclerosis (ICD10-I70.0).       09/07/2019 Tumor Marker   Patient's tumor was tested for the following markers: CA-125 Results of the tumor marker test revealed 214   11/08/2019 Imaging   1. Multiple mild-to-moderate severity thickened small bowel loops throughout the abdomen with associated peri intestinal inflammatory fat stranding. While this may represent an infectious or inflammatory enteritis, the presence of an underlying neoplastic process cannot be excluded. 2. Large amount of perihepatic fluid. 3. Stable bilateral adnexal masses, likely ovarian in origin. Correlation with pelvic ultrasound is recommended. 4. Small foci of parenchymal low attenuation within the right lobe of the liver which may represent small cysts or hemangiomas. 5. Stable left renal cyst. 6. Aortic atherosclerosis.   Aortic Atherosclerosis (ICD10-I70.0).     11/16/2019 Tumor Marker   Patient's tumor was tested for the  following markers: CA-125 Results of the tumor marker test revealed 456   12/07/2019 Tumor Marker   Patient's tumor was tested for the following markers: CA-125. Results of the tumor marker test revealed 676     REVIEW OF SYSTEMS:   Constitutional: Denies fevers, chills or abnormal weight loss Eyes: Denies blurriness of vision Ears,  nose, mouth, throat, and face: Denies mucositis or sore throat Respiratory: Denies cough, dyspnea or wheezes Cardiovascular: Denies palpitation, chest discomfort or lower extremity swelling Skin: Denies abnormal skin rashes Lymphatics: Denies new lymphadenopathy or easy bruising Neurological:Denies numbness, tingling or new weaknesses Behavioral/Psych: Mood is stable, no new changes  All other systems were reviewed with the patient and are negative.  I have reviewed the past medical history, past surgical history, social history and family history with the patient and they are unchanged from previous note.  ALLERGIES:  is allergic to zofran [ondansetron hcl].  MEDICATIONS:  Current Outpatient Medications  Medication Sig Dispense Refill  . acetaminophen (TYLENOL) 500 MG tablet Take 1 tablet (500 mg total) by mouth every 6 (six) hours as needed. (Patient taking differently: Take 1,000 mg by mouth every 6 (six) hours as needed for moderate pain. ) 30 tablet 3  . Calcium Carbonate (CALTRATE 600 PO) Take 1 tablet by mouth 2 (two) times daily.     . enalapril (VASOTEC) 20 MG tablet Take 1 tablet (20 mg total) by mouth daily. 90 tablet 1  . Fluticasone-Salmeterol (ADVAIR DISKUS) 100-50 MCG/DOSE AEPB USE 1 INHALATION BY MOUTH TWICE DAILY 60 each 2  . furosemide (LASIX) 40 MG tablet TAKE 1 TABLET BY MOUTH DAILY AS NEEDED FOR LEG SWELLING (Patient taking differently: Take 40 mg by mouth daily as needed (leg swelling). ) 90 tablet 2  . lenvatinib 10 mg daily dose (LENVIMA) capsule Take 10 mg every other day by mouth 30 capsule 11  . levothyroxine (SYNTHROID) 112 MCG tablet Take 1 tablet (112 mcg total) by mouth daily before breakfast. 30 tablet 11  . lidocaine-prilocaine (EMLA) cream Apply to affected area once (Patient taking differently: Apply 1 application topically daily as needed. Apply to affected area once) 30 g 3  . Multiple Vitamin (MULTIVITAMIN) tablet Take 1 tablet by mouth daily.    .  Multiple Vitamins-Minerals (ICAPS AREDS 2 PO) Take 1 capsule by mouth 2 (two) times daily.    . ondansetron (ZOFRAN) 8 MG tablet Take 1 tablet (8 mg total) by mouth every 8 (eight) hours as needed. Start on the third day after chemotherapy. 30 tablet 1  . oxyCODONE (OXY IR/ROXICODONE) 5 MG immediate release tablet Take 1 tablet (5 mg total) by mouth every 6 (six) hours as needed for severe pain. 60 tablet 0  . polyethylene glycol (MIRALAX / GLYCOLAX) 17 g packet Take 17 g by mouth daily as needed for moderate constipation.    . prochlorperazine (COMPAZINE) 10 MG tablet Take 1 tablet (10 mg total) by mouth every 6 (six) hours as needed (Nausea or vomiting). 60 tablet 1  . rivaroxaban (XARELTO) 20 MG TABS tablet Take 1 tablet (20 mg total) by mouth daily with supper. 90 tablet 3  . rosuvastatin (CRESTOR) 10 MG tablet Take 1 tablet (10 mg total) by mouth daily. Start with 54m for the first week. 90 tablet 0  . Vitamin D, Cholecalciferol, 1000 units CAPS Take 1,000 Units by mouth daily.      No current facility-administered medications for this visit.   Facility-Administered Medications Ordered in Other Visits  Medication Dose Route  Frequency Provider Last Rate Last Admin  . sodium chloride flush (NS) 0.9 % injection 10 mL  10 mL Intracatheter PRN Alvy Bimler, Corneluis Allston, MD   10 mL at 12/27/19 1644    PHYSICAL EXAMINATION: ECOG PERFORMANCE STATUS: 1 - Symptomatic but completely ambulatory  Vitals:   12/27/19 1355  BP: (!) 144/77  Pulse: 96  Resp: 18  Temp: 98.1 F (36.7 C)  SpO2: 99%   Filed Weights   12/27/19 1355  Weight: (!) 205 lb 3.2 oz (93.1 kg)    GENERAL:alert, no distress and comfortable SKIN: skin color, texture, turgor are normal, no rashes or significant lesions EYES: normal, Conjunctiva are pink and non-injected, sclera clear OROPHARYNX:no exudate, no erythema and lips, buccal mucosa, and tongue normal  NECK: supple, thyroid normal size, non-tender, without nodularity LYMPH:  no  palpable lymphadenopathy in the cervical, axillary or inguinal LUNGS: clear to auscultation and percussion with normal breathing effort HEART: regular rate & rhythm and no murmurs with mild trace bilateral lower extremity edema ABDOMEN:abdomen soft, non-tender and normal bowel sounds Musculoskeletal:no cyanosis of digits and no clubbing  NEURO: alert & oriented x 3 with fluent speech, no focal motor/sensory deficits  LABORATORY DATA:  I have reviewed the data as listed    Component Value Date/Time   NA 137 12/27/2019 1327   NA 142 12/25/2014 1122   K 4.3 12/27/2019 1327   K 4.1 12/25/2014 1122   CL 102 12/27/2019 1327   CL 102 10/04/2012 1159   CO2 27 12/27/2019 1327   CO2 29 12/25/2014 1122   GLUCOSE 87 12/27/2019 1327   GLUCOSE 99 12/25/2014 1122   GLUCOSE 116 (H) 10/04/2012 1159   BUN 10 12/27/2019 1327   BUN 13.7 12/25/2014 1122   CREATININE 0.91 12/27/2019 1327   CREATININE 0.96 (H) 02/27/2016 1530   CREATININE 0.9 12/25/2014 1122   CALCIUM 10.4 (H) 12/27/2019 1327   CALCIUM 10.1 12/25/2014 1122   PROT 7.3 12/27/2019 1327   PROT 7.8 12/25/2014 1122   ALBUMIN 3.2 (L) 12/27/2019 1327   ALBUMIN 3.4 (L) 12/25/2014 1122   AST 29 12/27/2019 1327   AST 21 12/25/2014 1122   ALT 8 12/27/2019 1327   ALT 17 12/25/2014 1122   ALKPHOS 79 12/27/2019 1327   ALKPHOS 99 12/25/2014 1122   BILITOT 0.4 12/27/2019 1327   BILITOT 0.41 12/25/2014 1122   GFRNONAA 60 (L) 12/27/2019 1327   GFRNONAA 58 (L) 02/27/2016 1530   GFRAA >60 12/27/2019 1327   GFRAA 66 02/27/2016 1530    No results found for: SPEP, UPEP  Lab Results  Component Value Date   WBC 9.2 12/27/2019   NEUTROABS 5.5 12/27/2019   HGB 11.3 (L) 12/27/2019   HCT 33.5 (L) 12/27/2019   MCV 104.7 (H) 12/27/2019   PLT 200 12/27/2019      Chemistry      Component Value Date/Time   NA 137 12/27/2019 1327   NA 142 12/25/2014 1122   K 4.3 12/27/2019 1327   K 4.1 12/25/2014 1122   CL 102 12/27/2019 1327   CL 102  10/04/2012 1159   CO2 27 12/27/2019 1327   CO2 29 12/25/2014 1122   BUN 10 12/27/2019 1327   BUN 13.7 12/25/2014 1122   CREATININE 0.91 12/27/2019 1327   CREATININE 0.96 (H) 02/27/2016 1530   CREATININE 0.9 12/25/2014 1122      Component Value Date/Time   CALCIUM 10.4 (H) 12/27/2019 1327   CALCIUM 10.1 12/25/2014 1122   ALKPHOS 79 12/27/2019 1327  ALKPHOS 99 12/25/2014 1122   AST 29 12/27/2019 1327   AST 21 12/25/2014 1122   ALT 8 12/27/2019 1327   ALT 17 12/25/2014 1122   BILITOT 0.4 12/27/2019 1327   BILITOT 0.41 12/25/2014 1122

## 2019-12-27 NOTE — Assessment & Plan Note (Signed)
Her TSH has improved since introduction of Synthroid We will continue to adjust the dose as needed  

## 2019-12-28 LAB — T4: T4, Total: 7.8 ug/dL (ref 4.5–12.0)

## 2020-01-11 ENCOUNTER — Other Ambulatory Visit: Payer: Self-pay

## 2020-01-11 MED ORDER — FUROSEMIDE 40 MG PO TABS: 40.0000 mg | ORAL_TABLET | Freq: Every day | ORAL | 1 refills | Status: AC | PRN

## 2020-01-12 MED FILL — LENVIMA 10 MG DAILY DOSE: 10 | 30 days supply | Qty: 15 | Fill #1

## 2020-01-17 ENCOUNTER — Other Ambulatory Visit: Payer: Self-pay

## 2020-01-17 ENCOUNTER — Inpatient Hospital Stay: Payer: Medicare Other

## 2020-01-17 ENCOUNTER — Encounter: Payer: Self-pay | Admitting: Hematology and Oncology

## 2020-01-17 ENCOUNTER — Inpatient Hospital Stay: Payer: Medicare Other | Attending: Gynecologic Oncology

## 2020-01-17 ENCOUNTER — Inpatient Hospital Stay (HOSPITAL_BASED_OUTPATIENT_CLINIC_OR_DEPARTMENT_OTHER): Payer: Medicare Other | Admitting: Hematology and Oncology

## 2020-01-17 VITALS — BP 114/68 | HR 79 | Temp 98.0°F | Resp 18 | Ht 67.0 in | Wt 202.0 lb

## 2020-01-17 DIAGNOSIS — C55 Malignant neoplasm of uterus, part unspecified: Secondary | ICD-10-CM | POA: Diagnosis not present

## 2020-01-17 DIAGNOSIS — Z5112 Encounter for antineoplastic immunotherapy: Secondary | ICD-10-CM | POA: Insufficient documentation

## 2020-01-17 DIAGNOSIS — C541 Malignant neoplasm of endometrium: Secondary | ICD-10-CM | POA: Diagnosis present

## 2020-01-17 DIAGNOSIS — T451X5A Adverse effect of antineoplastic and immunosuppressive drugs, initial encounter: Secondary | ICD-10-CM

## 2020-01-17 DIAGNOSIS — D6481 Anemia due to antineoplastic chemotherapy: Secondary | ICD-10-CM | POA: Insufficient documentation

## 2020-01-17 DIAGNOSIS — R946 Abnormal results of thyroid function studies: Secondary | ICD-10-CM

## 2020-01-17 DIAGNOSIS — Z853 Personal history of malignant neoplasm of breast: Secondary | ICD-10-CM | POA: Diagnosis not present

## 2020-01-17 DIAGNOSIS — N183 Chronic kidney disease, stage 3 unspecified: Secondary | ICD-10-CM | POA: Insufficient documentation

## 2020-01-17 LAB — CBC WITH DIFFERENTIAL (CANCER CENTER ONLY)
Abs Immature Granulocytes: 0.03 10*3/uL (ref 0.00–0.07)
Basophils Absolute: 0 10*3/uL (ref 0.0–0.1)
Basophils Relative: 0 %
Eosinophils Absolute: 1.1 10*3/uL — ABNORMAL HIGH (ref 0.0–0.5)
Eosinophils Relative: 15 %
HCT: 32.9 % — ABNORMAL LOW (ref 36.0–46.0)
Hemoglobin: 10.8 g/dL — ABNORMAL LOW (ref 12.0–15.0)
Immature Granulocytes: 0 %
Lymphocytes Relative: 16 %
Lymphs Abs: 1.2 10*3/uL (ref 0.7–4.0)
MCH: 34.4 pg — ABNORMAL HIGH (ref 26.0–34.0)
MCHC: 32.8 g/dL (ref 30.0–36.0)
MCV: 104.8 fL — ABNORMAL HIGH (ref 80.0–100.0)
Monocytes Absolute: 0.8 10*3/uL (ref 0.1–1.0)
Monocytes Relative: 11 %
Neutro Abs: 4.4 10*3/uL (ref 1.7–7.7)
Neutrophils Relative %: 58 %
Platelet Count: 192 10*3/uL (ref 150–400)
RBC: 3.14 MIL/uL — ABNORMAL LOW (ref 3.87–5.11)
RDW: 12.8 % (ref 11.5–15.5)
WBC Count: 7.6 10*3/uL (ref 4.0–10.5)
nRBC: 0 % (ref 0.0–0.2)

## 2020-01-17 LAB — CMP (CANCER CENTER ONLY)
ALT: 9 U/L (ref 0–44)
AST: 26 U/L (ref 15–41)
Albumin: 3.1 g/dL — ABNORMAL LOW (ref 3.5–5.0)
Alkaline Phosphatase: 71 U/L (ref 38–126)
Anion gap: 6 (ref 5–15)
BUN: 14 mg/dL (ref 8–23)
CO2: 25 mmol/L (ref 22–32)
Calcium: 9.6 mg/dL (ref 8.9–10.3)
Chloride: 106 mmol/L (ref 98–111)
Creatinine: 0.9 mg/dL (ref 0.44–1.00)
GFR, Est AFR Am: >60 mL/min (ref >60)
GFR, Estimated: >60 mL/min (ref >60)
Glucose, Bld: 90 mg/dL (ref 70–99)
Potassium: 4.4 mmol/L (ref 3.5–5.1)
Sodium: 137 mmol/L (ref 135–145)
Total Bilirubin: 0.4 mg/dL (ref 0.3–1.2)
Total Protein: 6.9 g/dL (ref 6.5–8.1)

## 2020-01-17 LAB — TSH: TSH: 6.335 u[IU]/mL — ABNORMAL HIGH (ref 0.308–3.960)

## 2020-01-17 LAB — TOTAL PROTEIN, URINE DIPSTICK: Protein, ur: NEGATIVE mg/dL

## 2020-01-17 MED ORDER — SODIUM CHLORIDE 0.9% FLUSH
10.0000 mL | Freq: Once | INTRAVENOUS | Status: AC
  Administered 2020-01-17: 10 mL
  Filled 2020-01-17: qty 10

## 2020-01-17 MED ORDER — SODIUM CHLORIDE 0.9% FLUSH
10.0000 mL | INTRAVENOUS | Status: DC | PRN
  Administered 2020-01-17: 10 mL
  Filled 2020-01-17: qty 10

## 2020-01-17 MED ORDER — HEPARIN SOD (PORK) LOCK FLUSH 100 UNIT/ML IV SOLN
500.0000 [IU] | Freq: Once | INTRAVENOUS | Status: AC | PRN
  Administered 2020-01-17: 500 [IU]
  Filled 2020-01-17: qty 5

## 2020-01-17 MED ORDER — SODIUM CHLORIDE 0.9 % IV SOLN
200.0000 mg | Freq: Once | INTRAVENOUS | Status: AC
  Administered 2020-01-17: 200 mg via INTRAVENOUS
  Filled 2020-01-17: qty 8

## 2020-01-17 MED ORDER — SODIUM CHLORIDE 0.9 % IV SOLN
Freq: Once | INTRAVENOUS | Status: AC
  Filled 2020-01-17: qty 250

## 2020-01-17 NOTE — Progress Notes (Signed)
[facility] OFFICE PROGRESS NOTE  Patient Care Team: Erin Clan, DO as PCP - General (Family Medicine) Erin Jacks, MD as Consulting Physician (Ophthalmology) Erin Esparza, DPM as Consulting Physician (Podiatry) Erin Esparza, Erin Freud, MD as Referring Physician (Optometry) Erin Lights, MD (Inactive) as Consulting Physician (Orthopedic Surgery) Erin Liner, RN as Oncology Nurse Navigator (Oncology)  ASSESSMENT & PLAN:  Uterine cancer Erin Esparza) Her recent imaging studies showed she has stable disease control Her symptoms are improving and she have less frequent diarrhea since we changed her lenvatinib to every other day She will continue pembrolizumab without delay I plan to repeat CT imaging before her next visit and she is in agreement  Anemia due to antineoplastic chemotherapy She has chronic baseline anemia due to chronic kidney disease She is mildly symptomatic Recommend observation only for now  CKD (chronic kidney disease), stage III ([place]) Her serum creatinine is satisfactory We discussed the importance of adequate hydration and risk factor modification  Abnormal thyroid function test Her TSH has improved since introduction of Synthroid We will continue to adjust the dose as needed    Orders Placed This Encounter  Procedures  . CT ABDOMEN PELVIS W CONTRAST    Standing Status:   Future    Standing Expiration Date:   01/16/2021    Order Specific Question:   If indicated for the ordered procedure, I authorize the administration of contrast media per Radiology protocol    Answer:   Yes    Order Specific Question:   Preferred imaging location?    Answer:   [facility]    Order Specific Question:   Radiology Contrast Protocol - do NOT remove file path    Answer:   [email or web address]    All questions were answered. The patient knows to call the clinic with any problems, questions or concerns. The total time spent  in the appointment was 20 minutes encounter with patients including review of chart and various tests results, discussions about plan of care and coordination of care plan   Erin Lark, MD 01/17/2020 1:12 PM  INTERVAL HISTORY: Please see below for problem oriented charting. 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 Ultrasound irregular hypoechoic mass 7 x 6 x 5 mm   01/06/2012 Initial Biopsy   Initial biopsy was benign . 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 Cervix, biopsy - HIGH GRADE CARCINOMA. - SEE COMMENT. 2. Endocervix, curettage - HIGH GRADE CARCINOMA. - SEE COMMENT. 3. Endometrium, biopsy - HIGH GRADE CARCINOMA. - SEE COMMENT. Microscopic Comment 1. - 3. The carcinoma in the three specimens is morphologically similar and  has features suggestive of papillary serous carcinoma.   07/26/2018 Tumor Marker   Patient's tumor was tested for the following markers: CA-125 Results of the tumor marker test revealed 835   07/28/2018 Imaging   1. Several (at least 10) solid pulmonary nodules scattered throughout both lungs are new since 2012 chest CT, likely representing pulmonary metastases, largest 6 mm, below PET resolution. 2. Redemonstration of hypodense 2.5 cm segment 7 right liver lobe mass, indeterminate, suspicious for liver metastasis. 3. 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 4. Three-vessel coronary atherosclerosis.  Aortic Atherosclerosis (ICD10-I70.0).   08/03/2018 Cancer Staging   Staging form: Corpus Uteri - Carcinoma and Carcinosarcoma, AJCC 8th Edition - Clinical: FIGO Stage IVB (cT3b, cN2, cM1) - Signed by Erin Lark, MD on 08/03/2018   08/05/2018 Tumor Marker   Patient's tumor was tested for the following markers: CA-125 Results of the tumor marker test revealed 901   08/06/2018 Procedure   Placement of single lumen port a cath via right internal jugular vein. The catheter tip lies at the cavo-atrial junction. A power injectable port a cath was placed and is ready for immediate use.    08/11/2018 - 01/12/2019 Chemotherapy   The patient had carboplatin and taxol x 8 cycles   09/01/2018 Tumor Marker   Patient's tumor was tested for the following markers: CA-125 Results of the tumor marker test revealed 406   09/09/2018 Genetic Testing   The Common Hereditary Cancers Panel offered by [facility] includes sequencing and/or deletion duplication testing of the following 48 genes: APC, ATM, AXIN2, BARD1, BMPR1A, BRCA1, BRCA2, BRIP1, CDH1, CDKN2A (p14ARF), CDKN2A (p16INK4a), CKD4, CHEK2, CTNNA1, DICER1, EPCAM (Deletion/duplication testing only), GREM1 (promoter region deletion/duplication testing only), KIT, MEN1, MLH1, MSH2, MSH3, MSH6, MUTYH, NBN, NF1, NHTL1, PALB2, PDGFRA, PMS2, POLD1,  POLE, PTEN, RAD50, RAD51C, RAD51D, RNF43, SDHB, SDHC, SDHD, SMAD4, SMARCA4. STK11, TP53, TSC1, TSC2, and VHL.  The following genes were evaluated for sequence changes only: SDHA and HOXB13 c.251G>A variant only.  Results: Negative, no pathogenic variants identified. The report date is 09/09/2018.     09/22/2018 Tumor Marker   Patient's tumor was tested for the following markers: CA-125 Results of the tumor marker test revealed 110   10/12/2018 Tumor Marker   Patient's tumor was tested for the following markers: CA-125 Results of the tumor marker test revealed 55.3   10/12/2018 Imaging   1. Mild decrease in pulmonary metastases, liver metastases, and metastatic abdominal and pelvic lymphadenopathy. 2. Decreased size of complex cystic lesion in left presacral region. 3. No new or progressive metastatic disease identified. 4. Stable small right inguinal hernia containing small portion of urinary bladder.   11/03/2018 Tumor Marker   Patient's tumor was tested for the following markers: CA-125 Results of the tumor marker test revealed 34.3    Genetic Testing   Patient has genetic testing done for HER2. Results revealed patient has the following: HER2 - negative from pathology from 07/26/18.   12/20/2018 Imaging   1. Possible new omental nodule. 2. Improving abdominal and pelvic retroperitoneal adenopathy. 3. Stable pulmonary and hepatic metastatic disease. 4. Left perirectal/presacral fluid collection has decreased in size. 5. Right inguinal hernia  contains a small portion of the bladder, as before. 6. Aortic atherosclerosis (ICD10-170.0). Coronary artery calcification   12/21/2018 Tumor Marker   Patient's tumor was tested for the following markers: CA-125 Results of the tumor marker test revealed 15.6   01/15/2019 Imaging   Ct abdomen and pelvis 1. Small hiatal hernia with fluid in the distal esophagus, which may represent reflux or esophagitis. No other acute findings. 2. Right inguinal  hernia contains anterolateral aspect of the urinary bladder, unchanged in appearance from prior. Mild edema within the hernia sac is similar. 3. Unchanged small low-density lesions in the liver. 4. Unchanged retroperitoneal and bilateral iliac lymph nodes. Small ventral abdominal omental nodule tentatively identified and unchanged from prior.   Aortic Atherosclerosis (ICD10-I70.0).   02/02/2019 Tumor Marker   Patient's tumor was tested for the following markers: CA-125 Results of the tumor marker test revealed 7.8   03/22/2019 Surgery   Preoperative diagnosis: History of incarcerated right inguinal hernia   Postop diagnosis: Incarcerated right inguinal hernia with preperitoneal fat without strangulation or obstruction   Procedure: Repair of right inguinal hernia with ultra Pro hernia system mesh   Surgeon: Erroll Luna, MD   03/31/2019 Tumor Marker   Patient's tumor was tested for the following markers: CA-`125 Results of the tumor marker test revealed 24.4.   05/25/2019 Tumor Marker   Patient's tumor was tested for the following markers: CA-125 Results of the tumor marker test revealed 287   05/25/2019 Imaging   New solid bilateral adnexal masses, consistent with metastatic disease.   New peritoneal soft tissue nodules in the anterior right pelvis, suspicious for peritoneal carcinomatosis. No evidence of ascites.   No significant change in shotty sub-cm abdominal retroperitoneal and bilateral iliac lymph nodes.   06/09/2019 -  Chemotherapy   The patient had pembrolizumab (KEYTRUDA) 200 mg in sodium chloride 0.9 % 50 mL chemo infusion, 200 mg, Intravenous, Once, 11 of 12 cycles Administration: 200 mg (06/09/2019), 200 mg (07/26/2019), 200 mg (08/16/2019), 200 mg (10/04/2019), 200 mg (06/30/2019), 200 mg (09/06/2019), 200 mg (10/25/2019), 200 mg (11/15/2019), 200 mg (12/06/2019), 200 mg (12/27/2019)  for chemotherapy treatment.    06/09/2019 Tumor Marker   Patient's tumor was tested for the  following markers: CA-125 Results of the tumor marker test revealed 468   07/26/2019 Tumor Marker   Patient's tumor was tested for the following markers: CA-125 Results of the tumor marker test revealed 282   08/17/2019 Tumor Marker   Patient's tumor was tested for the following markers: CA-125 Results of the tumor marker test revealed 190.   09/05/2019 Imaging   1. Subtle nodularity along the transverse mesocolon measuring 11 mm is concerning for peritoneal carcinomatosis. Also associated with perihepatic ascites and enlarging juxta diaphragmatic nodal disease.  Attention on follow-up. 2. Bilateral adnexal masses are similar in size to the previous exam. 3. Left para-aortic lymph node is slightly increased in size from the previous exam, slight interval enlargement is suspicious based on location for nodal involvement. Scattered subcentimeter lymph nodes throughout the left and right pelvis are similar to the previous exam. 4. Calcified coronary artery disease. 5. Low-density lesions in the liver are unchanged. 6. Persistent endometrial thickening not well assessed on CT. 7. Stranding in the right inguinal region tracking towards the abdominal wall likely related to prior inguinal hernia repair is unchanged. Area of discrete nodularity in this location is no longer seen.   Aortic Atherosclerosis (ICD10-I70.0).       09/07/2019 Tumor Marker   Patient's tumor  was tested for the following markers: CA-125 Results of the tumor marker test revealed 214   11/08/2019 Imaging   1. Multiple mild-to-moderate severity thickened small bowel loops throughout the abdomen with associated peri intestinal inflammatory fat stranding. While this may represent an infectious or inflammatory enteritis, the presence of an underlying neoplastic process cannot be excluded. 2. Large amount of perihepatic fluid. 3. Stable bilateral adnexal masses, likely ovarian in origin. Correlation with pelvic ultrasound is  recommended. 4. Small foci of parenchymal low attenuation within the right lobe of the liver which may represent small cysts or hemangiomas. 5. Stable left renal cyst. 6. Aortic atherosclerosis.   Aortic Atherosclerosis (ICD10-I70.0).     11/16/2019 Tumor Marker   Patient's tumor was tested for the following markers: CA-125 Results of the tumor marker test revealed 456   12/07/2019 Tumor Marker   Patient's tumor was tested for the following markers: CA-125. Results of the tumor marker test revealed 676     REVIEW OF SYSTEMS:   Constitutional: Denies fevers, chills or abnormal weight loss Eyes: Denies blurriness of vision Ears, nose, mouth, throat, and face: Denies mucositis or sore throat Respiratory: Denies cough, dyspnea or wheezes Cardiovascular: Denies palpitation, chest discomfort or lower extremity swelling Gastrointestinal:  Denies nausea, heartburn or change in bowel habits Skin: Denies abnormal skin rashes Lymphatics: Denies new lymphadenopathy or easy bruising Neurological:Denies numbness, tingling or new weaknesses Behavioral/Psych: Mood is stable, no new changes  All other systems were reviewed with the patient and are negative.  I have reviewed the past medical history, past surgical history, social history and family history with the patient and they are unchanged from previous note.  ALLERGIES:  is allergic to zofran [ondansetron hcl].  MEDICATIONS:  Current Outpatient Medications  Medication Sig Dispense Refill  . acetaminophen (TYLENOL) 500 MG tablet Take 1 tablet (500 mg total) by mouth every 6 (six) hours as needed. (Patient taking differently: Take 1,000 mg by mouth every 6 (six) hours as needed for moderate pain. ) 30 tablet 3  . Calcium Carbonate (CALTRATE 600 PO) Take 1 tablet by mouth 2 (two) times daily.     . enalapril (VASOTEC) 20 MG tablet Take 1 tablet (20 mg total) by mouth daily. 90 tablet 1  . Fluticasone-Salmeterol (ADVAIR DISKUS) 100-50 MCG/DOSE  AEPB USE 1 INHALATION BY MOUTH TWICE DAILY 60 each 2  . furosemide (LASIX) 40 MG tablet Take 1 tablet (40 mg total) by mouth daily as needed (leg swelling). 90 tablet 1  . lenvatinib 10 mg daily dose (LENVIMA) capsule Take 10 mg every other day by mouth 30 capsule 11  . levothyroxine (SYNTHROID) 112 MCG tablet Take 1 tablet (112 mcg total) by mouth daily before breakfast. 30 tablet 11  . lidocaine-prilocaine (EMLA) cream Apply to affected area once (Patient taking differently: Apply 1 application topically daily as needed. Apply to affected area once) 30 g 3  . Multiple Vitamin (MULTIVITAMIN) tablet Take 1 tablet by mouth daily.    . Multiple Vitamins-Minerals (ICAPS AREDS 2 PO) Take 1 capsule by mouth 2 (two) times daily.    . ondansetron (ZOFRAN) 8 MG tablet Take 1 tablet (8 mg total) by mouth every 8 (eight) hours as needed. Start on the third day after chemotherapy. 30 tablet 1  . oxyCODONE (OXY IR/ROXICODONE) 5 MG immediate release tablet Take 1 tablet (5 mg total) by mouth every 6 (six) hours as needed for severe pain. 60 tablet 0  . polyethylene glycol (MIRALAX / GLYCOLAX) 17 g packet  Take 17 g by mouth daily as needed for moderate constipation.    . prochlorperazine (COMPAZINE) 10 MG tablet Take 1 tablet (10 mg total) by mouth every 6 (six) hours as needed (Nausea or vomiting). 60 tablet 1  . rivaroxaban (XARELTO) 20 MG TABS tablet Take 1 tablet (20 mg total) by mouth daily with supper. 90 tablet 3  . rosuvastatin (CRESTOR) 10 MG tablet Take 1 tablet (10 mg total) by mouth daily. Start with 27m for the first week. 90 tablet 0  . Vitamin D, Cholecalciferol, 1000 units CAPS Take 1,000 Units by mouth daily.      No current facility-administered medications for this visit.   Facility-Administered Medications Ordered in Other Visits  Medication Dose Route Frequency Provider Last Rate Last Admin  . heparin lock flush 100 unit/mL  500 Units Intracatheter Once PRN GAlvy Bimler [place] Hollar, MD      . sodium  chloride flush (NS) 0.9 % injection 10 mL  10 mL Intracatheter PRN GAlvy Bimler Kadijah Shamoon, MD        PHYSICAL EXAMINATION: ECOG PERFORMANCE STATUS: 2 - Symptomatic, <50% confined to bed  Vitals:   01/17/20 1100  BP: 114/68  Pulse: 79  Resp: 18  Temp: 98 F (36.7 C)  SpO2: 100%   Filed Weights   01/17/20 1100  Weight: 202 lb (91.6 kg)    GENERAL:alert, no distress and comfortable SKIN: skin color, texture, turgor are normal, no rashes or significant lesions EYES: normal, Conjunctiva are pink and non-injected, sclera clear OROPHARYNX:no exudate, no erythema and lips, buccal mucosa, and tongue normal  NECK: supple, thyroid normal size, non-tender, without nodularity LYMPH:  no palpable lymphadenopathy in the cervical, axillary or inguinal LUNGS: clear to auscultation and percussion with normal breathing effort HEART: regular rate & rhythm and no murmurs and no lower extremity edema ABDOMEN:abdomen soft, non-tender and normal bowel sounds Musculoskeletal:no cyanosis of digits and no clubbing  NEURO: alert & oriented x 3 with fluent speech, no focal motor/sensory deficits  LABORATORY DATA:  I have reviewed the data as listed    Component Value Date/Time   NA 137 01/17/2020 1058   NA 142 12/25/2014 1122   K 4.4 01/17/2020 1058   K 4.1 12/25/2014 1122   CL 106 01/17/2020 1058   CL 102 10/04/2012 1159   CO2 25 01/17/2020 1058   CO2 29 12/25/2014 1122   GLUCOSE 90 01/17/2020 1058   GLUCOSE 99 12/25/2014 1122   GLUCOSE 116 (H) 10/04/2012 1159   BUN 14 01/17/2020 1058   BUN 13.7 12/25/2014 1122   CREATININE 0.90 01/17/2020 1058   CREATININE 0.96 (H) 02/27/2016 1530   CREATININE 0.9 12/25/2014 1122   CALCIUM 9.6 01/17/2020 1058   CALCIUM 10.1 12/25/2014 1122   PROT 6.9 01/17/2020 1058   PROT 7.8 12/25/2014 1122   ALBUMIN 3.1 (L) 01/17/2020 1058   ALBUMIN 3.4 (L) 12/25/2014 1122   AST 26 01/17/2020 1058   AST 21 12/25/2014 1122   ALT 9 01/17/2020 1058   ALT 17 12/25/2014 1122    ALKPHOS 71 01/17/2020 1058   ALKPHOS 99 12/25/2014 1122   BILITOT 0.4 01/17/2020 1058   BILITOT 0.41 12/25/2014 1122   GFRNONAA >60 01/17/2020 1058   GFRNONAA 58 (L) 02/27/2016 1530   GFRAA >60 01/17/2020 1058   GFRAA 66 02/27/2016 1530    No results found for: SPEP, UPEP  Lab Results  Component Value Date   WBC 7.6 01/17/2020   NEUTROABS 4.4 01/17/2020   HGB 10.8 (L) 01/17/2020  HCT 32.9 (L) 01/17/2020   MCV 104.8 (H) 01/17/2020   PLT 192 01/17/2020      Chemistry      Component Value Date/Time   NA 137 01/17/2020 1058   NA 142 12/25/2014 1122   K 4.4 01/17/2020 1058   K 4.1 12/25/2014 1122   CL 106 01/17/2020 1058   CL 102 10/04/2012 1159   CO2 25 01/17/2020 1058   CO2 29 12/25/2014 1122   BUN 14 01/17/2020 1058   BUN 13.7 12/25/2014 1122   CREATININE 0.90 01/17/2020 1058   CREATININE 0.96 (H) 02/27/2016 1530   CREATININE 0.9 12/25/2014 1122      Component Value Date/Time   CALCIUM 9.6 01/17/2020 1058   CALCIUM 10.1 12/25/2014 1122   ALKPHOS 71 01/17/2020 1058   ALKPHOS 99 12/25/2014 1122   AST 26 01/17/2020 1058   AST 21 12/25/2014 1122   ALT 9 01/17/2020 1058   ALT 17 12/25/2014 1122   BILITOT 0.4 01/17/2020 1058   BILITOT 0.41 12/25/2014 1122

## 2020-01-17 NOTE — Assessment & Plan Note (Signed)
Her recent imaging studies showed she has stable disease control Her symptoms are improving and she have less frequent diarrhea since we changed her lenvatinib to every other day She will continue pembrolizumab without delay I plan to repeat CT imaging before her next visit and she is in agreement

## 2020-01-17 NOTE — Patient Instructions (Signed)
[facility] Discharge Instructions for Patients Receiving Chemotherapy  Today you received the following chemotherapy agents:  Keytruda.  To help prevent nausea and vomiting after your treatment, we encourage you to take your nausea medication as directed.   If you develop nausea and vomiting that is not controlled by your nausea medication, call the clinic.   BELOW ARE SYMPTOMS THAT SHOULD BE REPORTED IMMEDIATELY:  *FEVER GREATER THAN 100.5 F  *CHILLS WITH OR WITHOUT FEVER  NAUSEA AND VOMITING THAT IS NOT CONTROLLED WITH YOUR NAUSEA MEDICATION  *UNUSUAL SHORTNESS OF BREATH  *UNUSUAL BRUISING OR BLEEDING  TENDERNESS IN MOUTH AND THROAT WITH OR WITHOUT PRESENCE OF ULCERS  *URINARY PROBLEMS  *BOWEL PROBLEMS  UNUSUAL RASH Items with * indicate a potential emergency and should be followed up as soon as possible.  Feel free to call the clinic should you have any questions or concerns. The clinic phone number is [phone].  Please show the CHEMO ALERT CARD at check-in to the Emergency Department and triage nurse.    

## 2020-01-17 NOTE — Assessment & Plan Note (Signed)
She has chronic baseline anemia due to chronic kidney disease She is mildly symptomatic Recommend observation only for now

## 2020-01-17 NOTE — Assessment & Plan Note (Signed)
Her TSH has improved since introduction of Synthroid We will continue to adjust the dose as needed

## 2020-01-17 NOTE — Assessment & Plan Note (Signed)
Her serum creatinine is satisfactory We discussed the importance of adequate hydration and risk factor modification 

## 2020-01-18 ENCOUNTER — Telehealth: Payer: Self-pay | Admitting: Hematology and Oncology

## 2020-01-18 ENCOUNTER — Telehealth: Payer: Self-pay

## 2020-01-18 LAB — CA 125: Cancer Antigen (CA) 125: 1333 U/mL — ABNORMAL HIGH (ref 0.0–38.1)

## 2020-01-18 LAB — T4: T4, Total: 9.1 ug/dL (ref 4.5–12.0)

## 2020-01-18 NOTE — Telephone Encounter (Signed)
Called and given below message. She vebralzied understanding. Given rescheduled for CT scan at 3:30 arrive at 3:15 to [facility], npo for 4 hours prior to scan, drink 1st bottle of contrast at 1:30p and 2nd bottle at 2:30. She verbalized udnerstanding to appt date and time to ct and Dr. Alvy Bimler.

## 2020-01-18 NOTE — Telephone Encounter (Signed)
Per 8/17 los, no changes made to pt schedule

## 2020-01-18 NOTE — Telephone Encounter (Signed)
-----   Message from [name], MD sent at 01/18/2020  9:14 AM EDT ----- Regarding: move CT to next week Her CA-125 is much higher I recommend moving CT to be done next week and see me the next day, 1 hour appt in the afternoon Call patient or her granddaughter and get insurance authorization

## 2020-01-26 ENCOUNTER — Ambulatory Visit (HOSPITAL_COMMUNITY)
Admission: RE | Admit: 2020-01-26 | Discharge: 2020-01-26 | Disposition: A | Discharge status: Home / Self Care | Payer: Medicare Other | Source: Ambulatory Visit | Attending: Hematology and Oncology | Admitting: Hematology and Oncology

## 2020-01-26 ENCOUNTER — Other Ambulatory Visit: Payer: Self-pay

## 2020-01-26 DIAGNOSIS — C800 Disseminated malignant neoplasm, unspecified: Secondary | ICD-10-CM | POA: Diagnosis not present

## 2020-01-26 DIAGNOSIS — C55 Malignant neoplasm of uterus, part unspecified: Secondary | ICD-10-CM | POA: Insufficient documentation

## 2020-01-26 DIAGNOSIS — K661 Hemoperitoneum: Secondary | ICD-10-CM | POA: Diagnosis not present

## 2020-01-26 DIAGNOSIS — I700 Atherosclerosis of aorta: Secondary | ICD-10-CM | POA: Diagnosis not present

## 2020-01-26 MED ORDER — IOHEXOL 300 MG/ML  SOLN
100.0000 mL | Freq: Once | INTRAMUSCULAR | Status: AC | PRN
  Administered 2020-01-26: 100 mL via INTRAVENOUS

## 2020-01-26 MED ORDER — HEPARIN SOD (PORK) LOCK FLUSH 100 UNIT/ML IV SOLN: 500.0000 [IU] | Freq: Once | INTRAVENOUS | Status: AC

## 2020-01-26 MED ORDER — HEPARIN SOD (PORK) LOCK FLUSH 100 UNIT/ML IV SOLN
INTRAVENOUS | Status: AC
  Administered 2020-01-26: 500 [IU] via INTRAVENOUS
  Filled 2020-01-26: qty 5

## 2020-01-27 ENCOUNTER — Other Ambulatory Visit: Payer: Self-pay

## 2020-01-27 ENCOUNTER — Inpatient Hospital Stay (HOSPITAL_BASED_OUTPATIENT_CLINIC_OR_DEPARTMENT_OTHER): Payer: Medicare Other | Admitting: Hematology and Oncology

## 2020-01-27 DIAGNOSIS — Z7189 Other specified counseling: Secondary | ICD-10-CM

## 2020-01-27 DIAGNOSIS — C55 Malignant neoplasm of uterus, part unspecified: Secondary | ICD-10-CM

## 2020-01-27 MED ORDER — OXYCODONE HCL 5 MG PO TABS: 5.0000 mg | ORAL_TABLET | Freq: Four times a day (QID) | ORAL | 0 refills | Status: DC | PRN

## 2020-01-28 ENCOUNTER — Encounter: Payer: Self-pay | Admitting: Hematology and Oncology

## 2020-01-28 NOTE — Progress Notes (Signed)
[facility] OFFICE PROGRESS NOTE  Patient Care Team: Allayne Stack, DO as PCP - General (Family Medicine) Ernesto Rutherford, MD as Consulting Physician (Ophthalmology) Charlsie Merles Kirstie Peri, DPM as Consulting Physician (Podiatry) Hanley Seamen, Dustin Folks, MD as Referring Physician (Optometry) Loreta Ave, MD (Inactive) as Consulting Physician (Orthopedic Surgery) Eduardo Osier, RN as Oncology Nurse Navigator (Oncology)  ASSESSMENT & PLAN:  Uterine cancer [facility]) Unfortunately she has disease progression She has mild intermittent abdominal discomfort She is quite debilitated, with estimated ECOG PS of 2 We have extensive discussions about treatment options: single agent carboplatin, single agent Taxol, carboplatin with taxol or palliative care/hospice referral The risks, benefits and side-effects of each option is fully discussed and she is under decided She will call me next week for final decision  Goals of care, counseling/discussion She is aware that treatment goal is palliative in nature We discussed prognosis with and without treatment   No orders of the defined types were placed in this encounter.   All questions were answered. The patient knows to call the clinic with any problems, questions or concerns. The total time spent in the appointment was 40 minutes encounter with patients including review of chart and various tests results, discussions about plan of care and coordination of care plan   Artis Delay, MD 01/28/2020 2:21 PM  INTERVAL HISTORY: Please see below for problem oriented charting. 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  Ultrasound irregular hypoechoic mass 7 x 6 x 5 mm   01/06/2012 Initial Biopsy   Initial biopsy was benign . Dr. 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 Cervix, biopsy - HIGH GRADE CARCINOMA. - SEE COMMENT. 2. Endocervix, curettage - HIGH GRADE CARCINOMA. - SEE COMMENT. 3. Endometrium, biopsy - HIGH GRADE CARCINOMA. - SEE COMMENT. Microscopic Comment 1. - 3. The carcinoma in the three specimens is morphologically similar and has features suggestive of papillary serous carcinoma.   07/26/2018 Tumor Marker   Patient's tumor was tested for the following markers: CA-125 Results of the tumor marker test revealed 835   07/28/2018 Imaging   1. Several (at least 10) solid pulmonary nodules scattered throughout both lungs are new since 2012 chest CT, likely representing pulmonary metastases, largest 6 mm, below PET resolution. 2. Redemonstration of hypodense 2.5 cm segment 7 right liver lobe mass, indeterminate, suspicious for liver metastasis. 3. 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 4. Three-vessel coronary atherosclerosis.  Aortic Atherosclerosis (ICD10-I70.0).   08/03/2018 Cancer Staging   Staging form: Corpus Uteri - Carcinoma and Carcinosarcoma, AJCC 8th Edition -  Clinical: FIGO Stage IVB (cT3b, cN2, cM1) - Signed by [name], MD on 08/03/2018   08/05/2018 Tumor Marker   Patient's tumor was tested for the following markers: CA-125 Results of the tumor marker test revealed 901   08/06/2018 Procedure   Placement of single lumen port a cath via right internal jugular vein. The catheter tip lies at the cavo-atrial junction. A power injectable port a cath was placed and is ready for immediate use.    08/11/2018 - 01/12/2019 Chemotherapy   The patient had carboplatin and taxol x 8 cycles   09/01/2018 Tumor Marker   Patient's tumor was tested for the following markers: CA-125 Results of the tumor marker test revealed 406   09/09/2018 Genetic Testing   The Common Hereditary Cancers Panel offered by [employer] includes sequencing and/or deletion duplication testing of the following 48 genes: APC, ATM, AXIN2, BARD1, BMPR1A, BRCA1, BRCA2, BRIP1, CDH1, CDKN2A (p14ARF), CDKN2A (p16INK4a), CKD4, CHEK2, CTNNA1, DICER1, EPCAM (Deletion/duplication testing only), GREM1 (promoter region deletion/duplication testing only), KIT, MEN1, MLH1, MSH2, MSH3, MSH6, MUTYH, NBN, NF1, NHTL1, PALB2, PDGFRA, PMS2, POLD1, POLE, PTEN, RAD50, RAD51C, RAD51D, RNF43, SDHB, SDHC, SDHD, SMAD4, SMARCA4. STK11, TP53, TSC1, TSC2, and VHL.  The following genes were evaluated for sequence changes only: SDHA and HOXB13 c.251G>A variant only.  Results: Negative, no pathogenic variants identified. The report date is 09/09/2018.     09/22/2018 Tumor Marker   Patient's tumor was tested for the following markers: CA-125 Results of the tumor marker test revealed 110   10/12/2018 Tumor Marker   Patient's tumor was tested for the following markers: CA-125 Results of the tumor marker test revealed 55.3   10/12/2018 Imaging   1. Mild decrease in pulmonary metastases, liver metastases, and metastatic abdominal and pelvic lymphadenopathy. 2. Decreased size of complex cystic lesion in left presacral region. 3. No  new or progressive metastatic disease identified. 4. Stable small right inguinal hernia containing small portion of urinary bladder.   11/03/2018 Tumor Marker   Patient's tumor was tested for the following markers: CA-125 Results of the tumor marker test revealed 34.3    Genetic Testing   Patient has genetic testing done for HER2. Results revealed patient has the following: HER2 - negative from pathology from 07/26/18.   12/20/2018 Imaging   1. Possible new omental nodule. 2. Improving abdominal and pelvic retroperitoneal adenopathy. 3. Stable pulmonary and hepatic metastatic disease. 4. Left perirectal/presacral fluid collection has decreased in size. 5. Right inguinal hernia contains a small portion of the bladder, as before. 6. Aortic atherosclerosis (ICD10-170.0). Coronary artery calcification   12/21/2018 Tumor Marker   Patient's tumor was tested for the following markers: CA-125 Results of the tumor marker test revealed 15.6   01/15/2019 Imaging   Ct abdomen and pelvis 1. Small hiatal hernia with fluid in the distal esophagus, which may represent reflux or esophagitis. No other acute findings. 2. Right inguinal hernia contains anterolateral aspect of the urinary bladder, unchanged in appearance from prior. Mild edema within the hernia sac is similar. 3. Unchanged small low-density lesions in the liver. 4. Unchanged retroperitoneal and bilateral iliac lymph nodes. Small ventral abdominal omental nodule tentatively identified and unchanged from prior.   Aortic Atherosclerosis (ICD10-I70.0).   02/02/2019 Tumor Marker   Patient's tumor was tested for the following markers: CA-125 Results of the tumor marker test revealed 7.8   03/22/2019  Surgery   Preoperative diagnosis: History of incarcerated right inguinal hernia   Postop diagnosis: Incarcerated right inguinal hernia with preperitoneal fat without strangulation or obstruction   Procedure: Repair of right inguinal hernia with  ultra Pro hernia system mesh   Surgeon: Erroll Luna, MD   03/31/2019 Tumor Marker   Patient's tumor was tested for the following markers: CA-`125 Results of the tumor marker test revealed 24.4.   05/25/2019 Tumor Marker   Patient's tumor was tested for the following markers: CA-125 Results of the tumor marker test revealed 287   05/25/2019 Imaging   New solid bilateral adnexal masses, consistent with metastatic disease.   New peritoneal soft tissue nodules in the anterior right pelvis, suspicious for peritoneal carcinomatosis. No evidence of ascites.   No significant change in shotty sub-cm abdominal retroperitoneal and bilateral iliac lymph nodes.   06/09/2019 - 01/17/2020 Chemotherapy   The patient had pembrolizumab (KEYTRUDA) 200 mg in sodium chloride 0.9 % 50 mL chemo infusion, 200 mg, Intravenous, Once, 11 of 12 cycles Administration: 200 mg (06/09/2019), 200 mg (07/26/2019), 200 mg (08/16/2019), 200 mg (10/04/2019), 200 mg (06/30/2019), 200 mg (09/06/2019), 200 mg (10/25/2019), 200 mg (11/15/2019), 200 mg (12/06/2019), 200 mg (12/27/2019), 200 mg (01/17/2020)  for chemotherapy treatment.    06/09/2019 Tumor Marker   Patient's tumor was tested for the following markers: CA-125 Results of the tumor marker test revealed 468   07/26/2019 Tumor Marker   Patient's tumor was tested for the following markers: CA-125 Results of the tumor marker test revealed 282   08/17/2019 Tumor Marker   Patient's tumor was tested for the following markers: CA-125 Results of the tumor marker test revealed 190.   09/05/2019 Imaging   1. Subtle nodularity along the transverse mesocolon measuring 11 mm is concerning for peritoneal carcinomatosis. Also associated with perihepatic ascites and enlarging juxta diaphragmatic nodal disease.  Attention on follow-up. 2. Bilateral adnexal masses are similar in size to the previous exam. 3. Left para-aortic lymph node is slightly increased in size from the previous exam, slight  interval enlargement is suspicious based on location for nodal involvement. Scattered subcentimeter lymph nodes throughout the left and right pelvis are similar to the previous exam. 4. Calcified coronary artery disease. 5. Low-density lesions in the liver are unchanged. 6. Persistent endometrial thickening not well assessed on CT. 7. Stranding in the right inguinal region tracking towards the abdominal wall likely related to prior inguinal hernia repair is unchanged. Area of discrete nodularity in this location is no longer seen.   Aortic Atherosclerosis (ICD10-I70.0).       09/07/2019 Tumor Marker   Patient's tumor was tested for the following markers: CA-125 Results of the tumor marker test revealed 214   11/08/2019 Imaging   1. Multiple mild-to-moderate severity thickened small bowel loops throughout the abdomen with associated peri intestinal inflammatory fat stranding. While this may represent an infectious or inflammatory enteritis, the presence of an underlying neoplastic process cannot be excluded. 2. Large amount of perihepatic fluid. 3. Stable bilateral adnexal masses, likely ovarian in origin. Correlation with pelvic ultrasound is recommended. 4. Small foci of parenchymal low attenuation within the right lobe of the liver which may represent small cysts or hemangiomas. 5. Stable left renal cyst. 6. Aortic atherosclerosis.   Aortic Atherosclerosis (ICD10-I70.0).     11/16/2019 Tumor Marker   Patient's tumor was tested for the following markers: CA-125 Results of the tumor marker test revealed 456   12/07/2019 Tumor Marker   Patient's tumor was tested  for the following markers: CA-125. Results of the tumor marker test revealed 676   01/17/2020 Tumor Marker   Patient's tumor was tested for the following markers: CA-125 Results of the tumor marker test revealed 1333   01/26/2020 Imaging   Increased peritoneal carcinomatosis, mild ascites, and tiny right pleural  effusion. Increased size of bilateral adnexal masses, consistent with metastatic disease.   New soft tissue density involving the right anterior cervix and right bladder base, consistent with metastatic disease.   Mildly increased abdominal and pelvic lymphadenopathy, consistent with metastatic disease.   Aortic Atherosclerosis (ICD10-I70.0).       REVIEW OF SYSTEMS:   Constitutional: Denies fevers, chills or abnormal weight loss Eyes: Denies blurriness of vision Ears, nose, mouth, throat, and face: Denies mucositis or sore throat Respiratory: Denies cough, dyspnea or wheezes Cardiovascular: Denies palpitation, chest discomfort or lower extremity swelling Gastrointestinal:  Denies nausea, heartburn or change in bowel habits Skin: Denies abnormal skin rashes Lymphatics: Denies new lymphadenopathy or easy bruising Neurological:Denies numbness, tingling or new weaknesses Behavioral/Psych: Mood is stable, no new changes  All other systems were reviewed with the patient and are negative.  I have reviewed the past medical history, past surgical history, social history and family history with the patient and they are unchanged from previous note.  ALLERGIES:  is allergic to zofran [ondansetron hcl].  MEDICATIONS:  Current Outpatient Medications  Medication Sig Dispense Refill  . acetaminophen (TYLENOL) 500 MG tablet Take 1 tablet (500 mg total) by mouth every 6 (six) hours as needed. (Patient taking differently: Take 1,000 mg by mouth every 6 (six) hours as needed for moderate pain. ) 30 tablet 3  . Calcium Carbonate (CALTRATE 600 PO) Take 1 tablet by mouth 2 (two) times daily.     . enalapril (VASOTEC) 20 MG tablet Take 1 tablet (20 mg total) by mouth daily. 90 tablet 1  . Fluticasone-Salmeterol (ADVAIR DISKUS) 100-50 MCG/DOSE AEPB USE 1 INHALATION BY MOUTH TWICE DAILY 60 each 2  . furosemide (LASIX) 40 MG tablet Take 1 tablet (40 mg total) by mouth daily as needed (leg swelling). 90  tablet 1  . levothyroxine (SYNTHROID) 112 MCG tablet Take 1 tablet (112 mcg total) by mouth daily before breakfast. 30 tablet 11  . lidocaine-prilocaine (EMLA) cream Apply to affected area once (Patient taking differently: Apply 1 application topically daily as needed. Apply to affected area once) 30 g 3  . Multiple Vitamin (MULTIVITAMIN) tablet Take 1 tablet by mouth daily.    . Multiple Vitamins-Minerals (ICAPS AREDS 2 PO) Take 1 capsule by mouth 2 (two) times daily.    . ondansetron (ZOFRAN) 8 MG tablet Take 1 tablet (8 mg total) by mouth every 8 (eight) hours as needed. Start on the third day after chemotherapy. 30 tablet 1  . oxyCODONE (OXY IR/ROXICODONE) 5 MG immediate release tablet Take 1 tablet (5 mg total) by mouth every 6 (six) hours as needed for severe pain. 60 tablet 0  . polyethylene glycol (MIRALAX / GLYCOLAX) 17 g packet Take 17 g by mouth daily as needed for moderate constipation.    . prochlorperazine (COMPAZINE) 10 MG tablet Take 1 tablet (10 mg total) by mouth every 6 (six) hours as needed (Nausea or vomiting). 60 tablet 1  . rivaroxaban (XARELTO) 20 MG TABS tablet Take 1 tablet (20 mg total) by mouth daily with supper. 90 tablet 3  . rosuvastatin (CRESTOR) 10 MG tablet Take 1 tablet (10 mg total) by mouth daily. Start with $RemoveBe'5mg'[id number]$  for  the first week. 90 tablet 0  . Vitamin D, Cholecalciferol, 1000 units CAPS Take 1,000 Units by mouth daily.      No current facility-administered medications for this visit.    PHYSICAL EXAMINATION: ECOG PERFORMANCE STATUS: 2 - Symptomatic, <50% confined to bed  Vitals:   01/27/20 1238  BP: 126/75  Pulse: 91  Resp: 17  Temp: 98.2 F (36.8 C)  SpO2: 99%   Filed Weights   01/27/20 1238  Weight: 200 lb 3.2 oz (90.8 kg)    GENERAL:alert, no distress and comfortable NEURO: alert & oriented x 3 with fluent speech, no focal motor/sensory deficits  LABORATORY DATA:  I have reviewed the data as listed    Component Value Date/Time   NA  137 01/17/2020 1058   NA 142 12/25/2014 1122   K 4.4 01/17/2020 1058   K 4.1 12/25/2014 1122   CL 106 01/17/2020 1058   CL 102 10/04/2012 1159   CO2 25 01/17/2020 1058   CO2 29 12/25/2014 1122   GLUCOSE 90 01/17/2020 1058   GLUCOSE 99 12/25/2014 1122   GLUCOSE 116 (H) 10/04/2012 1159   BUN 14 01/17/2020 1058   BUN 13.7 12/25/2014 1122   CREATININE 0.90 01/17/2020 1058   CREATININE 0.96 (H) 02/27/2016 1530   CREATININE 0.9 12/25/2014 1122   CALCIUM 9.6 01/17/2020 1058   CALCIUM 10.1 12/25/2014 1122   PROT 6.9 01/17/2020 1058   PROT 7.8 12/25/2014 1122   ALBUMIN 3.1 (L) 01/17/2020 1058   ALBUMIN 3.4 (L) 12/25/2014 1122   AST 26 01/17/2020 1058   AST 21 12/25/2014 1122   ALT 9 01/17/2020 1058   ALT 17 12/25/2014 1122   ALKPHOS 71 01/17/2020 1058   ALKPHOS 99 12/25/2014 1122   BILITOT 0.4 01/17/2020 1058   BILITOT 0.41 12/25/2014 1122   GFRNONAA >60 01/17/2020 1058   GFRNONAA 58 (L) 02/27/2016 1530   GFRAA >60 01/17/2020 1058   GFRAA 66 02/27/2016 1530    No results found for: SPEP, UPEP  Lab Results  Component Value Date   WBC 7.6 01/17/2020   NEUTROABS 4.4 01/17/2020   HGB 10.8 (L) 01/17/2020   HCT 32.9 (L) 01/17/2020   MCV 104.8 (H) 01/17/2020   PLT 192 01/17/2020      Chemistry      Component Value Date/Time   NA 137 01/17/2020 1058   NA 142 12/25/2014 1122   K 4.4 01/17/2020 1058   K 4.1 12/25/2014 1122   CL 106 01/17/2020 1058   CL 102 10/04/2012 1159   CO2 25 01/17/2020 1058   CO2 29 12/25/2014 1122   BUN 14 01/17/2020 1058   BUN 13.7 12/25/2014 1122   CREATININE 0.90 01/17/2020 1058   CREATININE 0.96 (H) 02/27/2016 1530   CREATININE 0.9 12/25/2014 1122      Component Value Date/Time   CALCIUM 9.6 01/17/2020 1058   CALCIUM 10.1 12/25/2014 1122   ALKPHOS 71 01/17/2020 1058   ALKPHOS 99 12/25/2014 1122   AST 26 01/17/2020 1058   AST 21 12/25/2014 1122   ALT 9 01/17/2020 1058   ALT 17 12/25/2014 1122   BILITOT 0.4 01/17/2020 1058   BILITOT  0.41 12/25/2014 1122       RADIOGRAPHIC STUDIES:I have reviewed multiple imaging studies with her I have personally reviewed the radiological images as listed and agreed with the findings in the report. CT ABDOMEN PELVIS W CONTRAST  Result Date: 01/26/2020 CLINICAL DATA:  Follow-up uterine carcinoma.  Rising CA 125 level. EXAM: CT  ABDOMEN AND PELVIS WITH CONTRAST TECHNIQUE: Multidetector CT imaging of the abdomen and pelvis was performed using the standard protocol following bolus administration of intravenous contrast. CONTRAST:  138m OMNIPAQUE IOHEXOL 300 MG/ML  SOLN COMPARISON:  11/08/2019 and 09/05/2019 FINDINGS: Lower Chest: New tiny right pleural effusion. Hepatobiliary: No hepatic masses identified. A few tiny hepatic cysts are again noted. Gallbladder is unremarkable. No evidence of biliary ductal dilatation. Pancreas:  No mass or inflammatory changes. Spleen: Within normal limits in size and appearance. Adrenals/Urinary Tract: No masses identified. Small left renal cysts remain stable. No evidence of ureteral calculi or hydronephrosis. Stomach/Bowel: No evidence of obstruction, inflammatory process or abnormal fluid collections. Vascular/Lymphatic: New lymphadenopathy is seen in porta hepatis measuring 2.5 cm on image 19/2. Retroperitoneal lymphadenopathy shows mild increased compared to prior exams. Index lymph node in left paraaortic region measures 14 mm on image 39/2, compared to 9 mm previously. New adenopathy in the left common iliac chain measures 13 mm on image 51/2. New adenopathy in right external iliac chain measures 1.5 cm on image 63/2. Increased lymphadenopathy in right inguinal region measures 13 mm on image 68/2, compared with 7 mm previously. Aortic atherosclerosis noted. Reproductive: Masslike soft tissue prominence in the cervix is again noted. New soft tissue density is seen involving the right anterior cervix and right bladder base which measures 2.8 x 2.0 cm on image 74/2.  Hydrometros again noted. Right adnexal mass has increased in size, measuring 7.9 x 6.0 cm on image 58/2, compared to 7.0 x 4.6 cm previously. Left adnexal mass is also increased in size, currently measuring 6.4 x 4.6 cm on image 59/2, compared to 5.1 x 4.0 cm previously. Stable loculated rim enhancing peritoneal fluid collection in the left posterior pelvis. Other: Mild perihepatic ascites is increased. Increased peritoneal thickening is seen in the perihepatic space. Increased abnormal soft tissue density is also seen in the omental fat adjacent to the splenic flexure of the colon and in the abdominal midline, consistent with increased peritoneal carcinomatosis. Musculoskeletal:  No suspicious bone lesions identified. IMPRESSION: Increased peritoneal carcinomatosis, mild ascites, and tiny right pleural effusion. Increased size of bilateral adnexal masses, consistent with metastatic disease. New soft tissue density involving the right anterior cervix and right bladder base, consistent with metastatic disease. Mildly increased abdominal and pelvic lymphadenopathy, consistent with metastatic disease. Aortic Atherosclerosis (ICD10-I70.0). Electronically Signed   By: JMarlaine HindM.D.   On: 01/26/2020 17:29

## 2020-01-28 NOTE — Assessment & Plan Note (Signed)
She is aware that treatment goal is palliative in nature We discussed prognosis with and without treatment

## 2020-01-28 NOTE — Assessment & Plan Note (Signed)
Unfortunately she has disease progression She has mild intermittent abdominal discomfort She is quite debilitated, with estimated ECOG PS of 2 We have extensive discussions about treatment options: single agent carboplatin, single agent Taxol, carboplatin with taxol or palliative care/hospice referral The risks, benefits and side-effects of each option is fully discussed and she is under decided She will call me next week for final decision

## 2020-01-30 ENCOUNTER — Telehealth: Payer: Self-pay

## 2020-01-30 NOTE — Telephone Encounter (Signed)
TC to Pt per Dr Alvy Bimler spoke with daughter in regards to Pt's decision. Daughter informed me that she was waiting to have a conversation with her brother about the matter and will call back with decision. Awaiting return call.

## 2020-01-30 NOTE — Telephone Encounter (Signed)
-----   Message from [name], MD sent at 01/30/2020  9:04 AM EDT ----- Regarding: decision about chemo versus palliative care Can you call daughter and ask about her decision?

## 2020-01-31 ENCOUNTER — Telehealth: Payer: Self-pay | Admitting: *Deleted

## 2020-01-31 NOTE — Telephone Encounter (Signed)
RN received call from pt stating after talking with family she has decided to proceed with single agent Carboplatin.  RN will alert MD of pt decision.

## 2020-02-01 ENCOUNTER — Telehealth: Payer: Self-pay | Admitting: Hematology and Oncology

## 2020-02-01 ENCOUNTER — Other Ambulatory Visit: Payer: Self-pay | Admitting: Hematology and Oncology

## 2020-02-01 NOTE — Telephone Encounter (Signed)
R/s appt per 8/31 sch msg - pt is aware of new appts on 9/15

## 2020-02-01 NOTE — Progress Notes (Signed)
DISCONTINUE OFF PATHWAY REGIMEN - Uterine   [id number]:Lenvatinib 20 mg PO Daily D1-21 + Pembrolizumab 200 mg IV D1 q21 Days:   A cycle is every 21 days:     Lenvatinib      Pembrolizumab   **Always confirm dose/schedule in your pharmacy ordering system**  REASON: Disease Progression PRIOR TREATMENT: Off Pathway: Lenvatinib 20 mg PO Daily D1-21 + Pembrolizumab 200 mg IV D1 q21 Days TREATMENT RESPONSE: Progressive Disease (PD)  START OFF PATHWAY REGIMEN - Uterine   [id number]:Carboplatin AUC=5 q21 Days:   A cycle is every 21 days:     Carboplatin   **Always confirm dose/schedule in your pharmacy ordering system**  Patient Characteristics: Serous Carcinoma, Recurrent/Progressive Disease, Third Line and Beyond, 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

## 2020-02-03 ENCOUNTER — Ambulatory Visit (HOSPITAL_COMMUNITY): Payer: Medicare Other

## 2020-02-07 ENCOUNTER — Other Ambulatory Visit: Payer: Medicare Other

## 2020-02-07 ENCOUNTER — Ambulatory Visit: Payer: Medicare Other | Admitting: Hematology and Oncology

## 2020-02-07 ENCOUNTER — Ambulatory Visit: Payer: Medicare Other

## 2020-02-15 ENCOUNTER — Inpatient Hospital Stay: Payer: Medicare Other | Attending: Gynecologic Oncology

## 2020-02-15 ENCOUNTER — Other Ambulatory Visit: Payer: Self-pay

## 2020-02-15 ENCOUNTER — Inpatient Hospital Stay: Payer: Medicare Other

## 2020-02-15 ENCOUNTER — Inpatient Hospital Stay (HOSPITAL_BASED_OUTPATIENT_CLINIC_OR_DEPARTMENT_OTHER): Payer: Medicare Other | Admitting: Hematology and Oncology

## 2020-02-15 ENCOUNTER — Encounter: Payer: Self-pay | Admitting: Hematology and Oncology

## 2020-02-15 DIAGNOSIS — Z23 Encounter for immunization: Secondary | ICD-10-CM

## 2020-02-15 DIAGNOSIS — C541 Malignant neoplasm of endometrium: Secondary | ICD-10-CM | POA: Insufficient documentation

## 2020-02-15 DIAGNOSIS — N183 Chronic kidney disease, stage 3 unspecified: Secondary | ICD-10-CM | POA: Insufficient documentation

## 2020-02-15 DIAGNOSIS — D631 Anemia in chronic kidney disease: Secondary | ICD-10-CM | POA: Insufficient documentation

## 2020-02-15 DIAGNOSIS — C55 Malignant neoplasm of uterus, part unspecified: Secondary | ICD-10-CM

## 2020-02-15 DIAGNOSIS — Z79899 Other long term (current) drug therapy: Secondary | ICD-10-CM | POA: Insufficient documentation

## 2020-02-15 DIAGNOSIS — D6481 Anemia due to antineoplastic chemotherapy: Secondary | ICD-10-CM

## 2020-02-15 DIAGNOSIS — Z5111 Encounter for antineoplastic chemotherapy: Secondary | ICD-10-CM | POA: Insufficient documentation

## 2020-02-15 DIAGNOSIS — Z7189 Other specified counseling: Secondary | ICD-10-CM

## 2020-02-15 DIAGNOSIS — T451X5A Adverse effect of antineoplastic and immunosuppressive drugs, initial encounter: Secondary | ICD-10-CM

## 2020-02-15 LAB — CMP (CANCER CENTER ONLY)
ALT: 7 U/L (ref 0–44)
AST: 21 U/L (ref 15–41)
Albumin: 3 g/dL — ABNORMAL LOW (ref 3.5–5.0)
Alkaline Phosphatase: 73 U/L (ref 38–126)
Anion gap: 7 (ref 5–15)
BUN: 13 mg/dL (ref 8–23)
CO2: 24 mmol/L (ref 22–32)
Calcium: 9.2 mg/dL (ref 8.9–10.3)
Chloride: 106 mmol/L (ref 98–111)
Creatinine: 0.97 mg/dL (ref 0.44–1.00)
GFR, Est AFR Am: >60 mL/min (ref >60)
GFR, Estimated: 55 mL/min — ABNORMAL LOW (ref >60)
Glucose, Bld: 88 mg/dL (ref 70–99)
Potassium: 4.1 mmol/L (ref 3.5–5.1)
Sodium: 137 mmol/L (ref 135–145)
Total Bilirubin: 0.3 mg/dL (ref 0.3–1.2)
Total Protein: 6.8 g/dL (ref 6.5–8.1)

## 2020-02-15 LAB — CBC WITH DIFFERENTIAL (CANCER CENTER ONLY)
Abs Immature Granulocytes: 0.05 10*3/uL (ref 0.00–0.07)
Basophils Absolute: 0 10*3/uL (ref 0.0–0.1)
Basophils Relative: 0 %
Eosinophils Absolute: 1 10*3/uL — ABNORMAL HIGH (ref 0.0–0.5)
Eosinophils Relative: 12 %
HCT: 29.1 % — ABNORMAL LOW (ref 36.0–46.0)
Hemoglobin: 9.5 g/dL — ABNORMAL LOW (ref 12.0–15.0)
Immature Granulocytes: 1 %
Lymphocytes Relative: 10 %
Lymphs Abs: 0.9 10*3/uL (ref 0.7–4.0)
MCH: 33.3 pg (ref 26.0–34.0)
MCHC: 32.6 g/dL (ref 30.0–36.0)
MCV: 102.1 fL — ABNORMAL HIGH (ref 80.0–100.0)
Monocytes Absolute: 0.9 10*3/uL (ref 0.1–1.0)
Monocytes Relative: 10 %
Neutro Abs: 5.9 10*3/uL (ref 1.7–7.7)
Neutrophils Relative %: 67 %
Platelet Count: 185 10*3/uL (ref 150–400)
RBC: 2.85 MIL/uL — ABNORMAL LOW (ref 3.87–5.11)
RDW: 12.6 % (ref 11.5–15.5)
WBC Count: 8.7 10*3/uL (ref 4.0–10.5)
nRBC: 0 % (ref 0.0–0.2)

## 2020-02-15 LAB — TSH: TSH: 1.944 u[IU]/mL (ref 0.308–3.960)

## 2020-02-15 LAB — TOTAL PROTEIN, URINE DIPSTICK

## 2020-02-15 MED ORDER — DIPHENHYDRAMINE HCL 50 MG/ML IJ SOLN
25.0000 mg | Freq: Once | INTRAMUSCULAR | Status: AC
  Administered 2020-02-15: 25 mg via INTRAVENOUS

## 2020-02-15 MED ORDER — FAMOTIDINE IN NACL 20-0.9 MG/50ML-% IV SOLN
20.0000 mg | Freq: Once | INTRAVENOUS | Status: AC
  Administered 2020-02-15: 20 mg via INTRAVENOUS

## 2020-02-15 MED ORDER — HEPARIN SOD (PORK) LOCK FLUSH 100 UNIT/ML IV SOLN
500.0000 [IU] | Freq: Once | INTRAVENOUS | Status: DC | PRN
  Filled 2020-02-15: qty 5

## 2020-02-15 MED ORDER — PALONOSETRON HCL INJECTION 0.25 MG/5ML
0.2500 mg | Freq: Once | INTRAVENOUS | Status: AC
  Administered 2020-02-15: 0.25 mg via INTRAVENOUS

## 2020-02-15 MED ORDER — SODIUM CHLORIDE 0.9 % IV SOLN
Freq: Once | INTRAVENOUS | Status: AC
  Filled 2020-02-15: qty 250

## 2020-02-15 MED ORDER — PALONOSETRON HCL INJECTION 0.25 MG/5ML
INTRAVENOUS | Status: AC
Start: 2020-02-15 — End: ?
  Filled 2020-02-15: qty 5

## 2020-02-15 MED ORDER — SODIUM CHLORIDE 0.9 % IV SOLN
10.0000 mg | Freq: Once | INTRAVENOUS | Status: AC
  Administered 2020-02-15: 10 mg via INTRAVENOUS
  Filled 2020-02-15: qty 10

## 2020-02-15 MED ORDER — FAMOTIDINE IN NACL 20-0.9 MG/50ML-% IV SOLN
INTRAVENOUS | Status: AC
  Filled 2020-02-15: qty 50

## 2020-02-15 MED ORDER — SODIUM CHLORIDE 0.9 % IV SOLN
150.0000 mg | Freq: Once | INTRAVENOUS | Status: AC
  Administered 2020-02-15: 150 mg via INTRAVENOUS
  Filled 2020-02-15: qty 150

## 2020-02-15 MED ORDER — SODIUM CHLORIDE 0.9% FLUSH
10.0000 mL | INTRAVENOUS | Status: DC | PRN
  Filled 2020-02-15: qty 10

## 2020-02-15 MED ORDER — SODIUM CHLORIDE 0.9 % IV SOLN
446.5000 mg | Freq: Once | INTRAVENOUS | Status: AC
  Administered 2020-02-15: 450 mg via INTRAVENOUS
  Filled 2020-02-15: qty 45

## 2020-02-15 MED ORDER — DIPHENHYDRAMINE HCL 50 MG/ML IJ SOLN
INTRAMUSCULAR | Status: AC
  Filled 2020-02-15: qty 1

## 2020-02-15 NOTE — Assessment & Plan Note (Signed)
We discussed the role of chemotherapy. The intent is of curative intent.  We discussed some of the risks, benefits, side-effects of carboplatin  Some of the short term side-effects included, though not limited to, including weight loss, life threatening infections, risk of allergic reactions, need for transfusions of blood products, nausea, vomiting, change in bowel habits, loss of hair, admission to hospital for various reasons, and risks of death.   Long term side-effects are also discussed including risks of infertility, permanent damage to nerve function, hearing loss, chronic fatigue, kidney damage with possibility needing hemodialysis, and rare secondary malignancy including bone marrow disorders.  The patient is aware that the response rates discussed earlier is not guaranteed.  After a long discussion, patient made an informed decision to proceed with the prescribed plan of care.   Patient education material was dispensed.

## 2020-02-15 NOTE — Patient Instructions (Signed)
[facility] Discharge Instructions for Patients Receiving Chemotherapy  Today you received the following chemotherapy agents Carboplatin  To help prevent nausea and vomiting after your treatment, we encourage you to take your nausea medication as directed   If you develop nausea and vomiting that is not controlled by your nausea medication, call the clinic.   BELOW ARE SYMPTOMS THAT SHOULD BE REPORTED IMMEDIATELY:  *FEVER GREATER THAN 100.5 F  *CHILLS WITH OR WITHOUT FEVER  NAUSEA AND VOMITING THAT IS NOT CONTROLLED WITH YOUR NAUSEA MEDICATION  *UNUSUAL SHORTNESS OF BREATH  *UNUSUAL BRUISING OR BLEEDING  TENDERNESS IN MOUTH AND THROAT WITH OR WITHOUT PRESENCE OF ULCERS  *URINARY PROBLEMS  *BOWEL PROBLEMS  UNUSUAL RASH Items with * indicate a potential emergency and should be followed up as soon as possible.  Feel free to call the clinic should you have any questions or concerns. The clinic phone number is [phone].  Please show the CHEMO ALERT CARD at check-in to the Emergency Department and triage nurse.   

## 2020-02-15 NOTE — Assessment & Plan Note (Signed)
She has chronic baseline anemia due to chronic kidney disease She is mildly symptomatic Recommend observation only for now

## 2020-02-15 NOTE — Assessment & Plan Note (Signed)
Her serum creatinine is satisfactory We discussed the importance of adequate hydration and risk factor modification 

## 2020-02-15 NOTE — Patient Instructions (Signed)

## 2020-02-15 NOTE — Progress Notes (Signed)
[facility] OFFICE PROGRESS NOTE  Patient Care Team: [name], DO as PCP - General (Family Medicine) [name], MD as Consulting Physician (Ophthalmology) [name], DPM as Consulting Physician (Podiatry) [name], [name], MD as Referring Physician (Optometry) [name], MD (Inactive) as Consulting Physician (Orthopedic Surgery) [name], RN as Oncology Nurse Navigator (Oncology)  ASSESSMENT & PLAN:  Uterine cancer [facility]) We discussed the role of chemotherapy. The intent is of curative intent.  We discussed some of the risks, benefits, side-effects of carboplatin  Some of the short term side-effects included, though not limited to, including weight loss, life threatening infections, risk of allergic reactions, need for transfusions of blood products, nausea, vomiting, change in bowel habits, loss of hair, admission to hospital for various reasons, and risks of death.   Long term side-effects are also discussed including risks of infertility, permanent damage to nerve function, hearing loss, chronic fatigue, kidney damage with possibility needing hemodialysis, and rare secondary malignancy including bone marrow disorders.  The patient is aware that the response rates discussed earlier is not guaranteed.  After a long discussion, patient made an informed decision to proceed with the prescribed plan of care.   Patient education material was dispensed.    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   All questions were answered. The patient knows to call the clinic with any problems, questions  or concerns. The total time spent in the appointment was 20 minutes encounter with patients including review of chart and various tests results, discussions about plan of care and coordination of care plan   [name], MD 02/15/2020 3:23 PM  INTERVAL HISTORY: Please see below for problem oriented charting. She returns for treatment and follow-up She is doing well Denies abdominal pain No nausea or changes in bowel habits The patient denies any recent signs or symptoms of bleeding such as spontaneous epistaxis, hematuria or hematochezia.   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 Ultrasound irregular hypoechoic mass 7 x 6 x 5 mm   01/06/2012 Initial Biopsy   Initial biopsy was benign . 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 Cervix, biopsy - HIGH GRADE CARCINOMA. - SEE COMMENT. 2. Endocervix, curettage - HIGH GRADE  CARCINOMA. - SEE COMMENT. 3. Endometrium, biopsy - HIGH GRADE CARCINOMA. - SEE COMMENT. Microscopic Comment 1. - 3. The carcinoma in the three specimens is morphologically similar  and has features suggestive of papillary serous carcinoma.   07/26/2018 Tumor Marker   Patient's tumor was tested for the following markers: CA-125 Results of the tumor marker test revealed 835   07/28/2018 Imaging   1. Several (at least 10) solid pulmonary nodules scattered throughout both lungs are new since 2012 chest CT, likely representing pulmonary metastases, largest 6 mm, below PET resolution. 2. Redemonstration of hypodense 2.5 cm segment 7 right liver lobe mass, indeterminate, suspicious for liver metastasis. 3. 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 4. Three-vessel coronary atherosclerosis.  Aortic Atherosclerosis (ICD10-I70.0).   08/03/2018 Cancer Staging   Staging form: Corpus Uteri - Carcinoma and Carcinosarcoma, AJCC 8th Edition - Clinical: FIGO Stage IVB (cT3b, cN2, cM1) - Signed by [name], MD on 08/03/2018   08/05/2018 Tumor Marker   Patient's tumor was tested for the following markers: CA-125 Results of the tumor marker test revealed 901   08/06/2018 Procedure   Placement of single lumen port a cath via right internal jugular vein. The catheter tip lies at the cavo-atrial junction. A power injectable port a cath was placed and is ready for immediate use.    08/11/2018 - 01/12/2019 Chemotherapy   The patient had carboplatin and taxol x 8 cycles   09/01/2018 Tumor Marker   Patient's tumor was tested for the following markers: CA-125 Results of the tumor marker test revealed 406   09/09/2018 Genetic Testing   The Common Hereditary Cancers Panel offered by [facility] includes sequencing and/or deletion duplication testing of the following 48 genes: APC, ATM, AXIN2, BARD1, BMPR1A, BRCA1, BRCA2, BRIP1, CDH1, CDKN2A (p14ARF), CDKN2A (p16INK4a), CKD4, CHEK2, CTNNA1, DICER1, EPCAM (Deletion/duplication testing only), GREM1 (promoter region deletion/duplication testing only), KIT, MEN1, MLH1, MSH2, MSH3, MSH6, MUTYH, NBN, NF1, NHTL1, PALB2, PDGFRA, PMS2,  POLD1, POLE, PTEN, RAD50, RAD51C, RAD51D, RNF43, SDHB, SDHC, SDHD, SMAD4, SMARCA4. STK11, TP53, TSC1, TSC2, and VHL.  The following genes were evaluated for sequence changes only: SDHA and HOXB13 c.251G>A variant only.  Results: Negative, no pathogenic variants identified. The report date is 09/09/2018.     09/22/2018 Tumor Marker   Patient's tumor was tested for the following markers: CA-125 Results of the tumor marker test revealed 110   10/12/2018 Tumor Marker   Patient's tumor was tested for the following markers: CA-125 Results of the tumor marker test revealed 55.3   10/12/2018 Imaging   1. Mild decrease in pulmonary metastases, liver metastases, and metastatic abdominal and pelvic lymphadenopathy. 2. Decreased size of complex cystic lesion in left presacral region. 3. No new or progressive metastatic disease identified. 4. Stable small right inguinal hernia containing small portion of urinary bladder.   11/03/2018 Tumor Marker   Patient's tumor was tested for the following markers: CA-125 Results of the tumor marker test revealed 34.3    Genetic Testing   Patient has genetic testing done for HER2. Results revealed patient has the following: HER2 - negative from pathology from 07/26/18.   12/20/2018 Imaging   1. Possible new omental nodule. 2. Improving abdominal and pelvic retroperitoneal adenopathy. 3. Stable pulmonary and hepatic metastatic disease. 4. Left perirectal/presacral fluid collection has decreased in size. 5. Right inguinal hernia contains a small portion of the bladder, as before. 6. Aortic atherosclerosis (ICD10-170.0). Coronary artery calcification   12/21/2018 Tumor Marker   Patient's tumor was tested for the  following markers: CA-125 Results of the tumor marker test revealed 15.6   01/15/2019 Imaging   Ct abdomen and pelvis 1. Small hiatal hernia with fluid in the distal esophagus, which may represent reflux or esophagitis. No other acute findings. 2. Right  inguinal hernia contains anterolateral aspect of the urinary bladder, unchanged in appearance from prior. Mild edema within the hernia sac is similar. 3. Unchanged small low-density lesions in the liver. 4. Unchanged retroperitoneal and bilateral iliac lymph nodes. Small ventral abdominal omental nodule tentatively identified and unchanged from prior.   Aortic Atherosclerosis (ICD10-I70.0).   02/02/2019 Tumor Marker   Patient's tumor was tested for the following markers: CA-125 Results of the tumor marker test revealed 7.8   03/22/2019 Surgery   Preoperative diagnosis: History of incarcerated right inguinal hernia   Postop diagnosis: Incarcerated right inguinal hernia with preperitoneal fat without strangulation or obstruction   Procedure: Repair of right inguinal hernia with ultra Pro hernia system mesh   Surgeon: Erroll Luna, MD   03/31/2019 Tumor Marker   Patient's tumor was tested for the following markers: CA-`125 Results of the tumor marker test revealed 24.4.   05/25/2019 Tumor Marker   Patient's tumor was tested for the following markers: CA-125 Results of the tumor marker test revealed 287   05/25/2019 Imaging   New solid bilateral adnexal masses, consistent with metastatic disease.   New peritoneal soft tissue nodules in the anterior right pelvis, suspicious for peritoneal carcinomatosis. No evidence of ascites.   No significant change in shotty sub-cm abdominal retroperitoneal and bilateral iliac lymph nodes.   06/09/2019 - 01/17/2020 Chemotherapy   The patient had pembrolizumab (KEYTRUDA) 200 mg in sodium chloride 0.9 % 50 mL chemo infusion, 200 mg, Intravenous, Once, 11 of 12 cycles Administration: 200 mg (06/09/2019), 200 mg (07/26/2019), 200 mg (08/16/2019), 200 mg (10/04/2019), 200 mg (06/30/2019), 200 mg (09/06/2019), 200 mg (10/25/2019), 200 mg (11/15/2019), 200 mg (12/06/2019), 200 mg (12/27/2019), 200 mg (01/17/2020)  for chemotherapy treatment.    06/09/2019 Tumor Marker    Patient's tumor was tested for the following markers: CA-125 Results of the tumor marker test revealed 468   07/26/2019 Tumor Marker   Patient's tumor was tested for the following markers: CA-125 Results of the tumor marker test revealed 282   08/17/2019 Tumor Marker   Patient's tumor was tested for the following markers: CA-125 Results of the tumor marker test revealed 190.   09/05/2019 Imaging   1. Subtle nodularity along the transverse mesocolon measuring 11 mm is concerning for peritoneal carcinomatosis. Also associated with perihepatic ascites and enlarging juxta diaphragmatic nodal disease.  Attention on follow-up. 2. Bilateral adnexal masses are similar in size to the previous exam. 3. Left para-aortic lymph node is slightly increased in size from the previous exam, slight interval enlargement is suspicious based on location for nodal involvement. Scattered subcentimeter lymph nodes throughout the left and right pelvis are similar to the previous exam. 4. Calcified coronary artery disease. 5. Low-density lesions in the liver are unchanged. 6. Persistent endometrial thickening not well assessed on CT. 7. Stranding in the right inguinal region tracking towards the abdominal wall likely related to prior inguinal hernia repair is unchanged. Area of discrete nodularity in this location is no longer seen.   Aortic Atherosclerosis (ICD10-I70.0).       09/07/2019 Tumor Marker   Patient's tumor was tested for the following markers: CA-125 Results of the tumor marker test revealed 214   11/08/2019 Imaging   1. Multiple mild-to-moderate severity thickened  small bowel loops throughout the abdomen with associated peri intestinal inflammatory fat stranding. While this may represent an infectious or inflammatory enteritis, the presence of an underlying neoplastic process cannot be excluded. 2. Large amount of perihepatic fluid. 3. Stable bilateral adnexal masses, likely ovarian in origin. Correlation  with pelvic ultrasound is recommended. 4. Small foci of parenchymal low attenuation within the right lobe of the liver which may represent small cysts or hemangiomas. 5. Stable left renal cyst. 6. Aortic atherosclerosis.   Aortic Atherosclerosis (ICD10-I70.0).     11/16/2019 Tumor Marker   Patient's tumor was tested for the following markers: CA-125 Results of the tumor marker test revealed 456   12/07/2019 Tumor Marker   Patient's tumor was tested for the following markers: CA-125. Results of the tumor marker test revealed 676   01/17/2020 Tumor Marker   Patient's tumor was tested for the following markers: CA-125 Results of the tumor marker test revealed 1333   01/26/2020 Imaging   Increased peritoneal carcinomatosis, mild ascites, and tiny right pleural effusion. Increased size of bilateral adnexal masses, consistent with metastatic disease.   New soft tissue density involving the right anterior cervix and right bladder base, consistent with metastatic disease.   Mildly increased abdominal and pelvic lymphadenopathy, consistent with metastatic disease.   Aortic Atherosclerosis (ICD10-I70.0).     02/15/2020 -  Chemotherapy   The patient had palonosetron (ALOXI) injection 0.25 mg, 0.25 mg, Intravenous,  Once, 1 of 4 cycles Administration: 0.25 mg (02/15/2020) CARBOplatin (PARAPLATIN) 450 mg in sodium chloride 0.9 % 250 mL chemo infusion, 450 mg (100 % of original dose 446.5 mg), Intravenous,  Once, 1 of 4 cycles Dose modification: 446.5 mg (original dose 446.5 mg, Cycle 1) Administration: 450 mg (02/15/2020) fosaprepitant (EMEND) 150 mg in sodium chloride 0.9 % 145 mL IVPB, 150 mg, Intravenous,  Once, 1 of 4 cycles Administration: 150 mg (02/15/2020)  for chemotherapy treatment.      REVIEW OF SYSTEMS:   Constitutional: Denies fevers, chills or abnormal weight loss Eyes: Denies blurriness of vision Ears, nose, mouth, throat, and face: Denies mucositis or sore  throat Respiratory: Denies cough, dyspnea or wheezes Cardiovascular: Denies palpitation, chest discomfort or lower extremity swelling Gastrointestinal:  Denies nausea, heartburn or change in bowel habits Skin: Denies abnormal skin rashes Lymphatics: Denies new lymphadenopathy or easy bruising Neurological:Denies numbness, tingling or new weaknesses Behavioral/Psych: Mood is stable, no new changes  All other systems were reviewed with the patient and are negative.  I have reviewed the past medical history, past surgical history, social history and family history with the patient and they are unchanged from previous note.  ALLERGIES:  is allergic to zofran [ondansetron hcl].  MEDICATIONS:  Current Outpatient Medications  Medication Sig Dispense Refill  . acetaminophen (TYLENOL) 500 MG tablet Take 1 tablet (500 mg total) by mouth every 6 (six) hours as needed. (Patient taking differently: Take 1,000 mg by mouth every 6 (six) hours as needed for moderate pain. ) 30 tablet 3  . Calcium Carbonate (CALTRATE 600 PO) Take 1 tablet by mouth 2 (two) times daily.     . enalapril (VASOTEC) 20 MG tablet Take 1 tablet (20 mg total) by mouth daily. 90 tablet 1  . Fluticasone-Salmeterol (ADVAIR DISKUS) 100-50 MCG/DOSE AEPB USE 1 INHALATION BY MOUTH TWICE DAILY 60 each 2  . furosemide (LASIX) 40 MG tablet Take 1 tablet (40 mg total) by mouth daily as needed (leg swelling). 90 tablet 1  . levothyroxine (SYNTHROID) 112 MCG tablet Take 1  tablet (112 mcg total) by mouth daily before breakfast. 30 tablet 11  . lidocaine-prilocaine (EMLA) cream Apply to affected area once (Patient taking differently: Apply 1 application topically daily as needed. Apply to affected area once) 30 g 3  . Multiple Vitamin (MULTIVITAMIN) tablet Take 1 tablet by mouth daily.    . Multiple Vitamins-Minerals (ICAPS AREDS 2 PO) Take 1 capsule by mouth 2 (two) times daily.    . ondansetron (ZOFRAN) 8 MG tablet Take 1 tablet (8 mg total) by  mouth every 8 (eight) hours as needed. Start on the third day after chemotherapy. 30 tablet 1  . oxyCODONE (OXY IR/ROXICODONE) 5 MG immediate release tablet Take 1 tablet (5 mg total) by mouth every 6 (six) hours as needed for severe pain. 60 tablet 0  . polyethylene glycol (MIRALAX / GLYCOLAX) 17 g packet Take 17 g by mouth daily as needed for moderate constipation.    . prochlorperazine (COMPAZINE) 10 MG tablet Take 1 tablet (10 mg total) by mouth every 6 (six) hours as needed (Nausea or vomiting). 60 tablet 1  . rivaroxaban (XARELTO) 20 MG TABS tablet Take 1 tablet (20 mg total) by mouth daily with supper. 90 tablet 3  . rosuvastatin (CRESTOR) 10 MG tablet Take 1 tablet (10 mg total) by mouth daily. Start with 75m for the first week. 90 tablet 0  . Vitamin D, Cholecalciferol, 1000 units CAPS Take 1,000 Units by mouth daily.      No current facility-administered medications for this visit.   Facility-Administered Medications Ordered in Other Visits  Medication Dose Route Frequency Provider Last Rate Last Admin  . heparin lock flush 100 unit/mL  500 Units Intracatheter Once PRN GAlvy Bimler Rei Medlen, MD      . sodium chloride flush (NS) 0.9 % injection 10 mL  10 mL Intracatheter PRN GAlvy Bimler Estefania Kamiya, MD        PHYSICAL EXAMINATION: ECOG PERFORMANCE STATUS: 1 - Symptomatic but completely ambulatory  Vitals:   02/15/20 1001  BP: (!) 124/53  Pulse: 88  Resp: 18  Temp: (!) 97.3 F (36.3 C)  SpO2: 100%   Filed Weights   02/15/20 1001  Weight: 205 lb (93 kg)    GENERAL:alert, no distress and comfortable NEURO: alert & oriented x 3 with fluent speech, no focal motor/sensory deficits  LABORATORY DATA:  I have reviewed the data as listed    Component Value Date/Time   NA 137 02/15/2020 0940   NA 142 12/25/2014 1122   K 4.1 02/15/2020 0940   K 4.1 12/25/2014 1122   CL 106 02/15/2020 0940   CL 102 10/04/2012 1159   CO2 24 02/15/2020 0940   CO2 29 12/25/2014 1122   GLUCOSE 88 02/15/2020 0940    GLUCOSE 99 12/25/2014 1122   GLUCOSE 116 (H) 10/04/2012 1159   BUN 13 02/15/2020 0940   BUN 13.7 12/25/2014 1122   CREATININE 0.97 02/15/2020 0940   CREATININE 0.96 (H) 02/27/2016 1530   CREATININE 0.9 12/25/2014 1122   CALCIUM 9.2 02/15/2020 0940   CALCIUM 10.1 12/25/2014 1122   PROT 6.8 02/15/2020 0940   PROT 7.8 12/25/2014 1122   ALBUMIN 3.0 (L) 02/15/2020 0940   ALBUMIN 3.4 (L) 12/25/2014 1122   AST 21 02/15/2020 0940   AST 21 12/25/2014 1122   ALT 7 02/15/2020 0940   ALT 17 12/25/2014 1122   ALKPHOS 73 02/15/2020 0940   ALKPHOS 99 12/25/2014 1122   BILITOT 0.3 02/15/2020 0940   BILITOT 0.41 12/25/2014 1122   GFRNONAA  55 (L) 02/15/2020 0940   GFRNONAA 58 (L) 02/27/2016 1530   GFRAA >60 02/15/2020 0940   GFRAA 66 02/27/2016 1530    No results found for: SPEP, UPEP  Lab Results  Component Value Date   WBC 8.7 02/15/2020   NEUTROABS 5.9 02/15/2020   HGB 9.5 (L) 02/15/2020   HCT 29.1 (L) 02/15/2020   MCV 102.1 (H) 02/15/2020   PLT 185 02/15/2020      Chemistry      Component Value Date/Time   NA 137 02/15/2020 0940   NA 142 12/25/2014 1122   K 4.1 02/15/2020 0940   K 4.1 12/25/2014 1122   CL 106 02/15/2020 0940   CL 102 10/04/2012 1159   CO2 24 02/15/2020 0940   CO2 29 12/25/2014 1122   BUN 13 02/15/2020 0940   BUN 13.7 12/25/2014 1122   CREATININE 0.97 02/15/2020 0940   CREATININE 0.96 (H) 02/27/2016 1530   CREATININE 0.9 12/25/2014 1122      Component Value Date/Time   CALCIUM 9.2 02/15/2020 0940   CALCIUM 10.1 12/25/2014 1122   ALKPHOS 73 02/15/2020 0940   ALKPHOS 99 12/25/2014 1122   AST 21 02/15/2020 0940   AST 21 12/25/2014 1122   ALT 7 02/15/2020 0940   ALT 17 12/25/2014 1122   BILITOT 0.3 02/15/2020 0940   BILITOT 0.41 12/25/2014 1122

## 2020-02-16 LAB — T4: T4, Total: 8.2 ug/dL (ref 4.5–12.0)

## 2020-02-16 LAB — CA 125: Cancer Antigen (CA) 125: 1784 U/mL — ABNORMAL HIGH (ref 0.0–38.1)

## 2020-03-07 ENCOUNTER — Inpatient Hospital Stay: Payer: Medicare Other

## 2020-03-07 ENCOUNTER — Encounter: Payer: Self-pay | Admitting: Hematology and Oncology

## 2020-03-07 ENCOUNTER — Inpatient Hospital Stay: Payer: Medicare Other | Attending: Gynecologic Oncology | Admitting: Hematology and Oncology

## 2020-03-07 ENCOUNTER — Other Ambulatory Visit: Payer: Self-pay

## 2020-03-07 VITALS — BP 131/50 | HR 85 | Temp 98.0°F | Resp 18 | Wt 209.4 lb

## 2020-03-07 DIAGNOSIS — D61810 Antineoplastic chemotherapy induced pancytopenia: Secondary | ICD-10-CM | POA: Insufficient documentation

## 2020-03-07 DIAGNOSIS — R946 Abnormal results of thyroid function studies: Secondary | ICD-10-CM | POA: Insufficient documentation

## 2020-03-07 DIAGNOSIS — N183 Chronic kidney disease, stage 3 unspecified: Secondary | ICD-10-CM

## 2020-03-07 DIAGNOSIS — Z23 Encounter for immunization: Secondary | ICD-10-CM | POA: Insufficient documentation

## 2020-03-07 DIAGNOSIS — C55 Malignant neoplasm of uterus, part unspecified: Secondary | ICD-10-CM

## 2020-03-07 DIAGNOSIS — D61818 Other pancytopenia: Secondary | ICD-10-CM

## 2020-03-07 DIAGNOSIS — D539 Nutritional anemia, unspecified: Secondary | ICD-10-CM | POA: Diagnosis not present

## 2020-03-07 DIAGNOSIS — C541 Malignant neoplasm of endometrium: Secondary | ICD-10-CM | POA: Insufficient documentation

## 2020-03-07 DIAGNOSIS — Z5111 Encounter for antineoplastic chemotherapy: Secondary | ICD-10-CM | POA: Diagnosis not present

## 2020-03-07 DIAGNOSIS — Z452 Encounter for adjustment and management of vascular access device: Secondary | ICD-10-CM | POA: Diagnosis not present

## 2020-03-07 LAB — CMP (CANCER CENTER ONLY)
ALT: 7 U/L (ref 0–44)
AST: 17 U/L (ref 15–41)
Albumin: 3 g/dL — ABNORMAL LOW (ref 3.5–5.0)
Alkaline Phosphatase: 65 U/L (ref 38–126)
Anion gap: 2 — ABNORMAL LOW (ref 5–15)
BUN: 14 mg/dL (ref 8–23)
CO2: 29 mmol/L (ref 22–32)
Calcium: 9.4 mg/dL (ref 8.9–10.3)
Chloride: 106 mmol/L (ref 98–111)
Creatinine: 0.88 mg/dL (ref 0.44–1.00)
GFR, Estimated: >60 mL/min (ref >60)
Glucose, Bld: 84 mg/dL (ref 70–99)
Potassium: 4.3 mmol/L (ref 3.5–5.1)
Sodium: 137 mmol/L (ref 135–145)
Total Bilirubin: 0.3 mg/dL (ref 0.3–1.2)
Total Protein: 6.8 g/dL (ref 6.5–8.1)

## 2020-03-07 LAB — CBC WITH DIFFERENTIAL (CANCER CENTER ONLY)
Abs Immature Granulocytes: 0.02 10*3/uL (ref 0.00–0.07)
Basophils Absolute: 0 10*3/uL (ref 0.0–0.1)
Basophils Relative: 0 %
Eosinophils Absolute: 0 10*3/uL (ref 0.0–0.5)
Eosinophils Relative: 1 %
HCT: 24.6 % — ABNORMAL LOW (ref 36.0–46.0)
Hemoglobin: 7.9 g/dL — ABNORMAL LOW (ref 12.0–15.0)
Immature Granulocytes: 1 %
Lymphocytes Relative: 16 %
Lymphs Abs: 0.6 10*3/uL — ABNORMAL LOW (ref 0.7–4.0)
MCH: 33.1 pg (ref 26.0–34.0)
MCHC: 32.1 g/dL (ref 30.0–36.0)
MCV: 102.9 fL — ABNORMAL HIGH (ref 80.0–100.0)
Monocytes Absolute: 0.4 10*3/uL (ref 0.1–1.0)
Monocytes Relative: 11 %
Neutro Abs: 2.8 10*3/uL (ref 1.7–7.7)
Neutrophils Relative %: 71 %
Platelet Count: 74 10*3/uL — ABNORMAL LOW (ref 150–400)
RBC: 2.39 MIL/uL — ABNORMAL LOW (ref 3.87–5.11)
RDW: 13.3 % (ref 11.5–15.5)
WBC Count: 3.9 10*3/uL — ABNORMAL LOW (ref 4.0–10.5)
nRBC: 0 % (ref 0.0–0.2)

## 2020-03-07 LAB — TSH: TSH: 5.507 u[IU]/mL — ABNORMAL HIGH (ref 0.308–3.960)

## 2020-03-07 MED ORDER — SODIUM CHLORIDE 0.9% FLUSH
10.0000 mL | Freq: Once | INTRAVENOUS | Status: AC
  Administered 2020-03-07: 10 mL
  Filled 2020-03-07: qty 10

## 2020-03-07 MED ORDER — INFLUENZA VAC A&B SA ADJ QUAD 0.5 ML IM PRSY
0.5000 mL | PREFILLED_SYRINGE | Freq: Once | INTRAMUSCULAR | Status: AC
  Administered 2020-03-07: 0.5 mL via INTRAMUSCULAR

## 2020-03-07 MED ORDER — INFLUENZA VAC A&B SA ADJ QUAD 0.5 ML IM PRSY
PREFILLED_SYRINGE | INTRAMUSCULAR | Status: AC
  Filled 2020-03-07: qty 0.5

## 2020-03-07 MED ORDER — SODIUM CHLORIDE 0.9 % IV SOLN
Freq: Once | INTRAVENOUS | Status: DC
  Filled 2020-03-07: qty 250

## 2020-03-07 NOTE — Progress Notes (Signed)
[place] OFFICE PROGRESS NOTE  Patient Care Team: [name], DO as PCP - General (Family Medicine) [name], MD as Consulting Physician (Ophthalmology) [name], DPM as Consulting Physician (Podiatry) [name], [name], MD as Referring Physician (Optometry) [name], MD (Inactive) as Consulting Physician (Orthopedic Surgery) [name], RN as Oncology Nurse Navigator (Oncology)  ASSESSMENT & PLAN:  Uterine cancer [facility]) She developed profound pancytopenia due to recent treatments I recommend we cancel her treatment today and reschedule to the future She need at least 7 to 10 days to recover She is not symptomatic and would not need transfusion support today but may need it in the future I plan to reduce the dose of carboplatin to AUC of 4 in the future She is in agreement  Pancytopenia, acquired [facility]) She has severe pancytopenia due to treatment She is not symptomatic She does not need transfusion support I plan to order additional work-up in her next visit to see if there are mineral deficiencies that could be replaced There is no contraindication to remain on antiplatelet agents or anticoagulants as long as the platelet is greater than 50,000.  I plan to reduce the dose of chemotherapy in the future  CKD (chronic kidney disease), stage III (HCC) Her serum creatinine is satisfactory We discussed the importance of adequate hydration and risk factor modification   Orders Placed This Encounter  Procedures  . Iron and TIBC    Standing Status:   Future    Standing Expiration Date:   03/07/2021  . Vitamin B12    Standing Status:   Future    Standing Expiration Date:   03/07/2021  . Ferritin    Standing Status:   Future    Standing Expiration Date:   03/07/2021  . Sample to Blood Bank    Standing Status:   Standing    Number of Occurrences:   33    Standing Expiration Date:   03/07/2021    All questions were answered. The  patient knows to call the clinic with any problems, questions or concerns. The total time spent in the appointment was 30 minutes encounter with patients including review of chart and various tests results, discussions about plan of care and coordination of care plan   [name], MD 03/07/2020 12:20 PM  INTERVAL HISTORY: Please see below for problem oriented charting. She returns for treatment and follow-up She denies side effects from treatment so far No nausea, constipation or abdominal pain The patient denies any recent signs or symptoms of bleeding such as spontaneous epistaxis, hematuria or hematochezia.   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 Ultrasound irregular hypoechoic mass 7 x 6 x 5 mm   01/06/2012 Initial Biopsy   Initial biopsy was benign . 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 Cervix, biopsy - HIGH GRADE CARCINOMA. - SEE COMMENT. 2. Endocervix, curettage - HIGH GRADE CARCINOMA. - SEE COMMENT. 3. Endometrium, biopsy - HIGH GRADE CARCINOMA. - SEE COMMENT. Microscopic  Comment 1. - 3. The carcinoma in the three specimens is morphologically similar and has features suggestive of papillary serous carcinoma.   07/26/2018 Tumor Marker   Patient's tumor was tested for the following markers: CA-125 Results of the tumor marker test revealed 835   07/28/2018 Imaging   1. Several (at least 10) solid pulmonary nodules scattered throughout both lungs are new since 2012 chest CT, likely representing pulmonary metastases, largest 6 mm, below PET resolution. 2. Redemonstration of hypodense 2.5 cm segment 7 right liver lobe mass, indeterminate, suspicious for liver metastasis. 3. 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 4. Three-vessel coronary atherosclerosis.  Aortic Atherosclerosis (ICD10-I70.0).   08/03/2018 Cancer Staging   Staging form: Corpus Uteri - Carcinoma and Carcinosarcoma, AJCC 8th Edition - Clinical: FIGO Stage IVB (cT3b, cN2, cM1) - Signed by [name], MD on 08/03/2018   08/05/2018 Tumor Marker   Patient's tumor was tested for the following markers: CA-125 Results of the tumor marker test revealed 901   08/06/2018 Procedure   Placement of single lumen port a cath via right internal jugular vein. The catheter tip lies at the cavo-atrial junction. A power injectable port a cath was placed and is ready for immediate use.    08/11/2018 - 01/12/2019 Chemotherapy   The patient had carboplatin and taxol x 8 cycles   09/01/2018 Tumor Marker   Patient's tumor was tested for the following markers: CA-125 Results of the tumor marker test revealed 406   09/09/2018 Genetic Testing   The Common Hereditary Cancers Panel offered by [facility] includes sequencing and/or deletion duplication testing of the following 48 genes: APC, ATM, AXIN2, BARD1, BMPR1A, BRCA1, BRCA2, BRIP1, CDH1, CDKN2A (p14ARF), CDKN2A (p16INK4a), CKD4, CHEK2, CTNNA1, DICER1, EPCAM (Deletion/duplication testing only), GREM1 (promoter region deletion/duplication testing only),  KIT, MEN1, MLH1, MSH2, MSH3, MSH6, MUTYH, NBN, NF1, NHTL1, PALB2, PDGFRA, PMS2, POLD1, POLE, PTEN, RAD50, RAD51C, RAD51D, RNF43, SDHB, SDHC, SDHD, SMAD4, SMARCA4. STK11, TP53, TSC1, TSC2, and VHL.  The following genes were evaluated for sequence changes only: SDHA and HOXB13 c.251G>A variant only.  Results: Negative, no pathogenic variants identified. The report date is 09/09/2018.     09/22/2018 Tumor Marker   Patient's tumor was tested for the following markers: CA-125 Results of the tumor marker test revealed 110   10/12/2018 Tumor Marker   Patient's tumor was tested for the following markers: CA-125 Results of the tumor marker test revealed 55.3   10/12/2018 Imaging   1. Mild decrease in pulmonary metastases, liver metastases, and metastatic abdominal and pelvic lymphadenopathy. 2. Decreased size of complex cystic lesion in left presacral region. 3. No new or progressive metastatic disease identified. 4. Stable small right inguinal hernia containing small portion of urinary bladder.   11/03/2018 Tumor Marker   Patient's tumor was tested for the following markers: CA-125 Results of the tumor marker test revealed 34.3    Genetic Testing   Patient has genetic testing done for HER2. Results revealed patient has the following: HER2 - negative from pathology from 07/26/18.   12/20/2018 Imaging   1.  Possible new omental nodule. 2. Improving abdominal and pelvic retroperitoneal adenopathy. 3. Stable pulmonary and hepatic metastatic disease. 4. Left perirectal/presacral fluid collection has decreased in size. 5. Right inguinal hernia contains a small portion of the bladder, as before. 6. Aortic atherosclerosis (ICD10-170.0). Coronary artery calcification   12/21/2018 Tumor Marker   Patient's tumor was tested for the following markers: CA-125 Results of the tumor marker test revealed 15.6   01/15/2019 Imaging   Ct abdomen and pelvis 1. Small hiatal hernia with fluid in the distal esophagus,  which may represent reflux or esophagitis. No other acute findings. 2. Right inguinal hernia contains anterolateral aspect of the urinary bladder, unchanged in appearance from prior. Mild edema within the hernia sac is similar. 3. Unchanged small low-density lesions in the liver. 4. Unchanged retroperitoneal and bilateral iliac lymph nodes. Small ventral abdominal omental nodule tentatively identified and unchanged from prior.   Aortic Atherosclerosis (ICD10-I70.0).   02/02/2019 Tumor Marker   Patient's tumor was tested for the following markers: CA-125 Results of the tumor marker test revealed 7.8   03/22/2019 Surgery   Preoperative diagnosis: History of incarcerated right inguinal hernia   Postop diagnosis: Incarcerated right inguinal hernia with preperitoneal fat without strangulation or obstruction   Procedure: Repair of right inguinal hernia with ultra Pro hernia system mesh   Surgeon: Erroll Luna, MD   03/31/2019 Tumor Marker   Patient's tumor was tested for the following markers: CA-`125 Results of the tumor marker test revealed 24.4.   05/25/2019 Tumor Marker   Patient's tumor was tested for the following markers: CA-125 Results of the tumor marker test revealed 287   05/25/2019 Imaging   New solid bilateral adnexal masses, consistent with metastatic disease.   New peritoneal soft tissue nodules in the anterior right pelvis, suspicious for peritoneal carcinomatosis. No evidence of ascites.   No significant change in shotty sub-cm abdominal retroperitoneal and bilateral iliac lymph nodes.   06/09/2019 - 01/17/2020 Chemotherapy   The patient had pembrolizumab (KEYTRUDA) 200 mg in sodium chloride 0.9 % 50 mL chemo infusion, 200 mg, Intravenous, Once, 11 of 12 cycles Administration: 200 mg (06/09/2019), 200 mg (07/26/2019), 200 mg (08/16/2019), 200 mg (10/04/2019), 200 mg (06/30/2019), 200 mg (09/06/2019), 200 mg (10/25/2019), 200 mg (11/15/2019), 200 mg (12/06/2019), 200 mg (12/27/2019),  200 mg (01/17/2020)  for chemotherapy treatment.    06/09/2019 Tumor Marker   Patient's tumor was tested for the following markers: CA-125 Results of the tumor marker test revealed 468   07/26/2019 Tumor Marker   Patient's tumor was tested for the following markers: CA-125 Results of the tumor marker test revealed 282   08/17/2019 Tumor Marker   Patient's tumor was tested for the following markers: CA-125 Results of the tumor marker test revealed 190.   09/05/2019 Imaging   1. Subtle nodularity along the transverse mesocolon measuring 11 mm is concerning for peritoneal carcinomatosis. Also associated with perihepatic ascites and enlarging juxta diaphragmatic nodal disease.  Attention on follow-up. 2. Bilateral adnexal masses are similar in size to the previous exam. 3. Left para-aortic lymph node is slightly increased in size from the previous exam, slight interval enlargement is suspicious based on location for nodal involvement. Scattered subcentimeter lymph nodes throughout the left and right pelvis are similar to the previous exam. 4. Calcified coronary artery disease. 5. Low-density lesions in the liver are unchanged. 6. Persistent endometrial thickening not well assessed on CT. 7. Stranding in the right inguinal region tracking towards the abdominal wall likely related to prior  inguinal hernia repair is unchanged. Area of discrete nodularity in this location is no longer seen.   Aortic Atherosclerosis (ICD10-I70.0).       09/07/2019 Tumor Marker   Patient's tumor was tested for the following markers: CA-125 Results of the tumor marker test revealed 214   11/08/2019 Imaging   1. Multiple mild-to-moderate severity thickened small bowel loops throughout the abdomen with associated peri intestinal inflammatory fat stranding. While this may represent an infectious or inflammatory enteritis, the presence of an underlying neoplastic process cannot be excluded. 2. Large amount of perihepatic  fluid. 3. Stable bilateral adnexal masses, likely ovarian in origin. Correlation with pelvic ultrasound is recommended. 4. Small foci of parenchymal low attenuation within the right lobe of the liver which may represent small cysts or hemangiomas. 5. Stable left renal cyst. 6. Aortic atherosclerosis.   Aortic Atherosclerosis (ICD10-I70.0).     11/16/2019 Tumor Marker   Patient's tumor was tested for the following markers: CA-125 Results of the tumor marker test revealed 456   12/07/2019 Tumor Marker   Patient's tumor was tested for the following markers: CA-125. Results of the tumor marker test revealed 676   01/17/2020 Tumor Marker   Patient's tumor was tested for the following markers: CA-125 Results of the tumor marker test revealed 1333   01/26/2020 Imaging   Increased peritoneal carcinomatosis, mild ascites, and tiny right pleural effusion. Increased size of bilateral adnexal masses, consistent with metastatic disease.   New soft tissue density involving the right anterior cervix and right bladder base, consistent with metastatic disease.   Mildly increased abdominal and pelvic lymphadenopathy, consistent with metastatic disease.   Aortic Atherosclerosis (ICD10-I70.0).     02/15/2020 -  Chemotherapy   The patient had carboplatin for chemotherapy treatment.     02/15/2020 Tumor Marker   Patient's tumor was tested for the following markers: CA-125 Results of the tumor marker test revealed 1784     REVIEW OF SYSTEMS:   Constitutional: Denies fevers, chills or abnormal weight loss Eyes: Denies blurriness of vision Ears, nose, mouth, throat, and face: Denies mucositis or sore throat Respiratory: Denies cough, dyspnea or wheezes Cardiovascular: Denies palpitation, chest discomfort or lower extremity swelling Gastrointestinal:  Denies nausea, heartburn or change in bowel habits Skin: Denies abnormal skin rashes Lymphatics: Denies new lymphadenopathy or easy  bruising Neurological:Denies numbness, tingling or new weaknesses Behavioral/Psych: Mood is stable, no new changes  All other systems were reviewed with the patient and are negative.  I have reviewed the past medical history, past surgical history, social history and family history with the patient and they are unchanged from previous note.  ALLERGIES:  is allergic to zofran [ondansetron hcl].  MEDICATIONS:  Current Outpatient Medications  Medication Sig Dispense Refill  . acetaminophen (TYLENOL) 500 MG tablet Take 1 tablet (500 mg total) by mouth every 6 (six) hours as needed. (Patient taking differently: Take 1,000 mg by mouth every 6 (six) hours as needed for moderate pain. ) 30 tablet 3  . Calcium Carbonate (CALTRATE 600 PO) Take 1 tablet by mouth 2 (two) times daily.     . enalapril (VASOTEC) 20 MG tablet Take 1 tablet (20 mg total) by mouth daily. 90 tablet 1  . Fluticasone-Salmeterol (ADVAIR DISKUS) 100-50 MCG/DOSE AEPB USE 1 INHALATION BY MOUTH TWICE DAILY 60 each 2  . furosemide (LASIX) 40 MG tablet Take 1 tablet (40 mg total) by mouth daily as needed (leg swelling). 90 tablet 1  . levothyroxine (SYNTHROID) 112 MCG tablet Take 1 tablet (  112 mcg total) by mouth daily before breakfast. 30 tablet 11  . lidocaine-prilocaine (EMLA) cream Apply to affected area once (Patient taking differently: Apply 1 application topically daily as needed. Apply to affected area once) 30 g 3  . Multiple Vitamin (MULTIVITAMIN) tablet Take 1 tablet by mouth daily.    . Multiple Vitamins-Minerals (ICAPS AREDS 2 PO) Take 1 capsule by mouth 2 (two) times daily.    . ondansetron (ZOFRAN) 8 MG tablet Take 1 tablet (8 mg total) by mouth every 8 (eight) hours as needed. Start on the third day after chemotherapy. 30 tablet 1  . oxyCODONE (OXY IR/ROXICODONE) 5 MG immediate release tablet Take 1 tablet (5 mg total) by mouth every 6 (six) hours as needed for severe pain. 60 tablet 0  . polyethylene glycol (MIRALAX /  GLYCOLAX) 17 g packet Take 17 g by mouth daily as needed for moderate constipation.    . prochlorperazine (COMPAZINE) 10 MG tablet Take 1 tablet (10 mg total) by mouth every 6 (six) hours as needed (Nausea or vomiting). 60 tablet 1  . rivaroxaban (XARELTO) 20 MG TABS tablet Take 1 tablet (20 mg total) by mouth daily with supper. 90 tablet 3  . rosuvastatin (CRESTOR) 10 MG tablet Take 1 tablet (10 mg total) by mouth daily. Start with 15m for the first week. 90 tablet 0  . Vitamin D, Cholecalciferol, 1000 units CAPS Take 1,000 Units by mouth daily.      No current facility-administered medications for this visit.    PHYSICAL EXAMINATION: ECOG PERFORMANCE STATUS: 2 - Symptomatic, <50% confined to bed  Vitals:   03/07/20 1050  BP: (!) 131/50  Pulse: 85  Resp: 18  Temp: 98 F (36.7 C)  SpO2: 98%   Filed Weights   03/07/20 1050  Weight: 209 lb 6 oz (95 kg)    GENERAL:alert, no distress and comfortable NEURO: alert & oriented x 3 with fluent speech, no focal motor/sensory deficits  LABORATORY DATA:  I have reviewed the data as listed    Component Value Date/Time   NA 137 03/07/2020 1042   NA 142 12/25/2014 1122   K 4.3 03/07/2020 1042   K 4.1 12/25/2014 1122   CL 106 03/07/2020 1042   CL 102 10/04/2012 1159   CO2 29 03/07/2020 1042   CO2 29 12/25/2014 1122   GLUCOSE 84 03/07/2020 1042   GLUCOSE 99 12/25/2014 1122   GLUCOSE 116 (H) 10/04/2012 1159   BUN 14 03/07/2020 1042   BUN 13.7 12/25/2014 1122   CREATININE 0.88 03/07/2020 1042   CREATININE 0.96 (H) 02/27/2016 1530   CREATININE 0.9 12/25/2014 1122   CALCIUM 9.4 03/07/2020 1042   CALCIUM 10.1 12/25/2014 1122   PROT 6.8 03/07/2020 1042   PROT 7.8 12/25/2014 1122   ALBUMIN 3.0 (L) 03/07/2020 1042   ALBUMIN 3.4 (L) 12/25/2014 1122   AST 17 03/07/2020 1042   AST 21 12/25/2014 1122   ALT 7 03/07/2020 1042   ALT 17 12/25/2014 1122   ALKPHOS 65 03/07/2020 1042   ALKPHOS 99 12/25/2014 1122   BILITOT 0.3 03/07/2020 1042    BILITOT 0.41 12/25/2014 1122   GFRNONAA >60 03/07/2020 1042   GFRNONAA 58 (L) 02/27/2016 1530   GFRAA >60 02/15/2020 0940   GFRAA 66 02/27/2016 1530    No results found for: SPEP, UPEP  Lab Results  Component Value Date   WBC 3.9 (L) 03/07/2020   NEUTROABS 2.8 03/07/2020   HGB 7.9 (L) 03/07/2020   HCT 24.6 (L)  03/07/2020   MCV 102.9 (H) 03/07/2020   PLT 74 (L) 03/07/2020      Chemistry      Component Value Date/Time   NA 137 03/07/2020 1042   NA 142 12/25/2014 1122   K 4.3 03/07/2020 1042   K 4.1 12/25/2014 1122   CL 106 03/07/2020 1042   CL 102 10/04/2012 1159   CO2 29 03/07/2020 1042   CO2 29 12/25/2014 1122   BUN 14 03/07/2020 1042   BUN 13.7 12/25/2014 1122   CREATININE 0.88 03/07/2020 1042   CREATININE 0.96 (H) 02/27/2016 1530   CREATININE 0.9 12/25/2014 1122      Component Value Date/Time   CALCIUM 9.4 03/07/2020 1042   CALCIUM 10.1 12/25/2014 1122   ALKPHOS 65 03/07/2020 1042   ALKPHOS 99 12/25/2014 1122   AST 17 03/07/2020 1042   AST 21 12/25/2014 1122   ALT 7 03/07/2020 1042   ALT 17 12/25/2014 1122   BILITOT 0.3 03/07/2020 1042   BILITOT 0.41 12/25/2014 1122

## 2020-03-07 NOTE — Assessment & Plan Note (Signed)
Her serum creatinine is satisfactory We discussed the importance of adequate hydration and risk factor modification 

## 2020-03-07 NOTE — Assessment & Plan Note (Addendum)
She developed profound pancytopenia due to recent treatments I recommend we cancel her treatment today and reschedule to the future She need at least 7 to 10 days to recover She is not symptomatic and would not need transfusion support today but may need it in the future I plan to reduce the dose of carboplatin to AUC of 4 in the future She is in agreement

## 2020-03-07 NOTE — Assessment & Plan Note (Signed)
She has severe pancytopenia due to treatment She is not symptomatic She does not need transfusion support I plan to order additional work-up in her next visit to see if there are mineral deficiencies that could be replaced There is no contraindication to remain on antiplatelet agents or anticoagulants as long as the platelet is greater than 50,000.  I plan to reduce the dose of chemotherapy in the future

## 2020-03-07 NOTE — Patient Instructions (Signed)

## 2020-03-08 LAB — T4: T4, Total: 7.1 ug/dL (ref 4.5–12.0)

## 2020-03-08 LAB — CA 125: Cancer Antigen (CA) 125: 1037 U/mL — ABNORMAL HIGH (ref 0.0–38.1)

## 2020-03-19 ENCOUNTER — Inpatient Hospital Stay: Payer: Medicare Other

## 2020-03-19 ENCOUNTER — Other Ambulatory Visit: Payer: Self-pay

## 2020-03-19 ENCOUNTER — Inpatient Hospital Stay (HOSPITAL_BASED_OUTPATIENT_CLINIC_OR_DEPARTMENT_OTHER): Payer: Medicare Other | Admitting: Hematology and Oncology

## 2020-03-19 ENCOUNTER — Encounter: Payer: Self-pay | Admitting: Hematology and Oncology

## 2020-03-19 ENCOUNTER — Other Ambulatory Visit: Payer: Self-pay | Admitting: Hematology and Oncology

## 2020-03-19 VITALS — BP 125/53 | HR 82 | Temp 97.9°F | Resp 18 | Ht 67.0 in | Wt 211.6 lb

## 2020-03-19 DIAGNOSIS — C55 Malignant neoplasm of uterus, part unspecified: Secondary | ICD-10-CM | POA: Diagnosis not present

## 2020-03-19 DIAGNOSIS — T451X5A Adverse effect of antineoplastic and immunosuppressive drugs, initial encounter: Secondary | ICD-10-CM | POA: Diagnosis not present

## 2020-03-19 DIAGNOSIS — D539 Nutritional anemia, unspecified: Secondary | ICD-10-CM

## 2020-03-19 DIAGNOSIS — Z23 Encounter for immunization: Secondary | ICD-10-CM | POA: Diagnosis not present

## 2020-03-19 DIAGNOSIS — Z7189 Other specified counseling: Secondary | ICD-10-CM

## 2020-03-19 DIAGNOSIS — D6481 Anemia due to antineoplastic chemotherapy: Secondary | ICD-10-CM | POA: Diagnosis not present

## 2020-03-19 DIAGNOSIS — D61810 Antineoplastic chemotherapy induced pancytopenia: Secondary | ICD-10-CM | POA: Diagnosis not present

## 2020-03-19 DIAGNOSIS — R946 Abnormal results of thyroid function studies: Secondary | ICD-10-CM | POA: Diagnosis not present

## 2020-03-19 DIAGNOSIS — Z5111 Encounter for antineoplastic chemotherapy: Secondary | ICD-10-CM | POA: Diagnosis not present

## 2020-03-19 DIAGNOSIS — N183 Chronic kidney disease, stage 3 unspecified: Secondary | ICD-10-CM

## 2020-03-19 DIAGNOSIS — Z452 Encounter for adjustment and management of vascular access device: Secondary | ICD-10-CM | POA: Diagnosis not present

## 2020-03-19 LAB — CMP (CANCER CENTER ONLY)
ALT: 8 U/L (ref 0–44)
AST: 22 U/L (ref 15–41)
Albumin: 3.1 g/dL — ABNORMAL LOW (ref 3.5–5.0)
Alkaline Phosphatase: 76 U/L (ref 38–126)
Anion gap: 6 (ref 5–15)
BUN: 14 mg/dL (ref 8–23)
CO2: 27 mmol/L (ref 22–32)
Calcium: 9.8 mg/dL (ref 8.9–10.3)
Chloride: 105 mmol/L (ref 98–111)
Creatinine: 1.02 mg/dL — ABNORMAL HIGH (ref 0.44–1.00)
GFR, Estimated: 52 mL/min — ABNORMAL LOW (ref >60)
Glucose, Bld: 81 mg/dL (ref 70–99)
Potassium: 4.5 mmol/L (ref 3.5–5.1)
Sodium: 138 mmol/L (ref 135–145)
Total Bilirubin: 0.3 mg/dL (ref 0.3–1.2)
Total Protein: 7.1 g/dL (ref 6.5–8.1)

## 2020-03-19 LAB — CBC WITH DIFFERENTIAL (CANCER CENTER ONLY)
Abs Immature Granulocytes: 0.04 10*3/uL (ref 0.00–0.07)
Basophils Absolute: 0 10*3/uL (ref 0.0–0.1)
Basophils Relative: 1 %
Eosinophils Absolute: 0.2 10*3/uL (ref 0.0–0.5)
Eosinophils Relative: 3 %
HCT: 24.8 % — ABNORMAL LOW (ref 36.0–46.0)
Hemoglobin: 8.1 g/dL — ABNORMAL LOW (ref 12.0–15.0)
Immature Granulocytes: 1 %
Lymphocytes Relative: 11 %
Lymphs Abs: 0.8 10*3/uL (ref 0.7–4.0)
MCH: 33.6 pg (ref 26.0–34.0)
MCHC: 32.7 g/dL (ref 30.0–36.0)
MCV: 102.9 fL — ABNORMAL HIGH (ref 80.0–100.0)
Monocytes Absolute: 0.8 10*3/uL (ref 0.1–1.0)
Monocytes Relative: 12 %
Neutro Abs: 5.1 10*3/uL (ref 1.7–7.7)
Neutrophils Relative %: 72 %
Platelet Count: 310 10*3/uL (ref 150–400)
RBC: 2.41 MIL/uL — ABNORMAL LOW (ref 3.87–5.11)
RDW: 15.2 % (ref 11.5–15.5)
WBC Count: 7 10*3/uL (ref 4.0–10.5)
nRBC: 0 % (ref 0.0–0.2)

## 2020-03-19 LAB — VITAMIN B12: Vitamin B-12: 130 pg/mL — ABNORMAL LOW (ref 180–914)

## 2020-03-19 LAB — IRON AND TIBC
Iron: 69 ug/dL (ref 41–142)
Saturation Ratios: 28 % (ref 21–57)
TIBC: 248 ug/dL (ref 236–444)
UIBC: 179 ug/dL (ref 120–384)

## 2020-03-19 LAB — FERRITIN: Ferritin: 628 ng/mL — ABNORMAL HIGH (ref 11–307)

## 2020-03-19 LAB — SAMPLE TO BLOOD BANK

## 2020-03-19 MED ORDER — PALONOSETRON HCL INJECTION 0.25 MG/5ML
0.2500 mg | Freq: Once | INTRAVENOUS | Status: AC
  Administered 2020-03-19: 0.25 mg via INTRAVENOUS

## 2020-03-19 MED ORDER — FAMOTIDINE IN NACL 20-0.9 MG/50ML-% IV SOLN
INTRAVENOUS | Status: AC
  Filled 2020-03-19: qty 50

## 2020-03-19 MED ORDER — DIPHENHYDRAMINE HCL 50 MG/ML IJ SOLN
25.0000 mg | Freq: Once | INTRAMUSCULAR | Status: AC
  Administered 2020-03-19: 25 mg via INTRAVENOUS

## 2020-03-19 MED ORDER — PALONOSETRON HCL INJECTION 0.25 MG/5ML
INTRAVENOUS | Status: AC
  Filled 2020-03-19: qty 5

## 2020-03-19 MED ORDER — DIPHENHYDRAMINE HCL 50 MG/ML IJ SOLN
INTRAMUSCULAR | Status: AC
  Filled 2020-03-19: qty 1

## 2020-03-19 MED ORDER — HEPARIN SOD (PORK) LOCK FLUSH 100 UNIT/ML IV SOLN
500.0000 [IU] | Freq: Once | INTRAVENOUS | Status: AC | PRN
  Administered 2020-03-19: 500 [IU]
  Filled 2020-03-19: qty 5

## 2020-03-19 MED ORDER — SODIUM CHLORIDE 0.9% FLUSH
10.0000 mL | Freq: Once | INTRAVENOUS | Status: AC
  Administered 2020-03-19: 10 mL
  Filled 2020-03-19: qty 10

## 2020-03-19 MED ORDER — FAMOTIDINE IN NACL 20-0.9 MG/50ML-% IV SOLN
20.0000 mg | Freq: Once | INTRAVENOUS | Status: AC
  Administered 2020-03-19: 20 mg via INTRAVENOUS

## 2020-03-19 MED ORDER — SODIUM CHLORIDE 0.9 % IV SOLN
10.0000 mg | Freq: Once | INTRAVENOUS | Status: AC
  Administered 2020-03-19: 10 mg via INTRAVENOUS
  Filled 2020-03-19: qty 10
  Filled 2020-03-19: qty 1

## 2020-03-19 MED ORDER — CYANOCOBALAMIN 1000 MCG/ML IJ SOLN
INTRAMUSCULAR | Status: AC
  Filled 2020-03-19: qty 1

## 2020-03-19 MED ORDER — SODIUM CHLORIDE 0.9 % IV SOLN
364.0000 mg | Freq: Once | INTRAVENOUS | Status: AC
  Administered 2020-03-19: 360 mg via INTRAVENOUS
  Filled 2020-03-19: qty 36

## 2020-03-19 MED ORDER — SODIUM CHLORIDE 0.9 % IV SOLN
Freq: Once | INTRAVENOUS | Status: AC
  Filled 2020-03-19: qty 250

## 2020-03-19 MED ORDER — SODIUM CHLORIDE 0.9% FLUSH
10.0000 mL | INTRAVENOUS | Status: DC | PRN
  Administered 2020-03-19: 10 mL
  Filled 2020-03-19: qty 10

## 2020-03-19 MED ORDER — CYANOCOBALAMIN 1000 MCG/ML IJ SOLN
1000.0000 ug | Freq: Once | INTRAMUSCULAR | Status: AC
  Administered 2020-03-19: 1000 ug via INTRAMUSCULAR

## 2020-03-19 MED ORDER — SODIUM CHLORIDE 0.9 % IV SOLN
150.0000 mg | Freq: Once | INTRAVENOUS | Status: AC
  Administered 2020-03-19: 150 mg via INTRAVENOUS
  Filled 2020-03-19: qty 150
  Filled 2020-03-19: qty 5

## 2020-03-19 NOTE — Assessment & Plan Note (Signed)
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

## 2020-03-19 NOTE — Progress Notes (Signed)
[place] OFFICE PROGRESS NOTE  Patient Care Team: [name], DO as PCP - General (Family Medicine) [name], MD as Consulting Physician (Ophthalmology) [name], DPM as Consulting Physician (Podiatry) [name], [name], MD as Referring Physician (Optometry) [name], MD (Inactive) as Consulting Physician (Orthopedic Surgery) [name], RN as Oncology Nurse Navigator (Oncology)  ASSESSMENT & PLAN:  Uterine cancer [facility] Her blood counts improved with recent break However, given her profound chronic anemia, I suspect she might need blood transfusion next week I will bring her back to get a CBC recheck and if her hemoglobin is less than 8, we will give her a unit of blood I plan to also space out her treatment to every 4 weeks After cycle 3 of treatment, I plan to repeat imaging study, probably due around first week of December She will get reduced dose carboplatin at AUC of 4  Anemia due to antineoplastic chemotherapy She has multifactorial anemia I am rechecking serum vitamin B12 and iron studies to see if there are other attributable causes She will return next week to get hemoglobin recheck and if hemoglobin is less than 8, we will order a unit of blood  CKD (chronic kidney disease), stage III ([place]) Her serum creatinine is satisfactory We discussed the importance of adequate hydration and risk factor modification  Abnormal thyroid function test 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 TSH    Standing Status:   Standing    Number of Occurrences:   22    Standing Expiration Date:   03/19/2021    All questions were answered. The patient knows to call the clinic with any problems, questions or concerns. The total time spent in the  appointment was 30 minutes encounter with patients including review of chart and various tests results, discussions about plan of care and coordination of care plan   [name], MD 03/19/2020 10:10 AM  INTERVAL HISTORY: Please see below for problem oriented charting. She is seen prior to cycle 2 of treatment We delay cycle 2 of treatment due to recent severe pancytopenia The patient denies any recent signs or symptoms of bleeding such as spontaneous epistaxis, hematuria or hematochezia. 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 Ultrasound irregular hypoechoic mass 7 x 6 x 5 mm   01/06/2012 Initial Biopsy   Initial biopsy was benign . 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 Cervix, biopsy - HIGH GRADE CARCINOMA. - SEE COMMENT. 2. Endocervix, curettage - HIGH GRADE CARCINOMA. - SEE COMMENT. 3. Endometrium, biopsy - HIGH GRADE CARCINOMA. - SEE  COMMENT. Microscopic Comment 1. - 3. The carcinoma in the three specimens is morphologically similar and has features suggestive of papillary serous carcinoma.   07/26/2018 Tumor Marker   Patient's tumor was tested for the following markers: CA-125 Results of the tumor marker test revealed 835   07/28/2018 Imaging   1. Several (at least 10) solid pulmonary nodules scattered throughout both lungs are new since 2012 chest CT, likely representing pulmonary metastases, largest 6 mm, below PET resolution. 2. Redemonstration of hypodense 2.5 cm segment 7 right liver lobe mass, indeterminate, suspicious for liver metastasis. 3. 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 4. Three-vessel coronary atherosclerosis.  Aortic Atherosclerosis (ICD10-I70.0).   08/03/2018 Cancer Staging   Staging form: Corpus Uteri - Carcinoma and Carcinosarcoma, AJCC 8th Edition - Clinical: FIGO Stage IVB (cT3b, cN2, cM1) - Signed by [name], MD on 08/03/2018   08/05/2018 Tumor Marker   Patient's tumor was tested for the following markers: CA-125 Results of the tumor marker test revealed 901   08/06/2018 Procedure   Placement of single lumen port a cath via right internal jugular vein. The catheter tip lies at the cavo-atrial junction. A power injectable port a cath was placed and is ready for immediate use.    08/11/2018 - 01/12/2019 Chemotherapy   The patient had carboplatin and taxol x 8 cycles   09/01/2018 Tumor Marker   Patient's tumor was tested for the following markers: CA-125 Results of the tumor marker test revealed 406   09/09/2018 Genetic Testing   The Common Hereditary Cancers Panel offered by [facility] includes sequencing and/or deletion duplication testing of the following 48 genes: APC, ATM, AXIN2, BARD1, BMPR1A, BRCA1, BRCA2, BRIP1, CDH1, CDKN2A (p14ARF), CDKN2A (p16INK4a), CKD4, CHEK2, CTNNA1, DICER1, EPCAM (Deletion/duplication testing only), GREM1 (promoter region  deletion/duplication testing only), KIT, MEN1, MLH1, MSH2, MSH3, MSH6, MUTYH, NBN, NF1, NHTL1, PALB2, PDGFRA, PMS2, POLD1, POLE, PTEN, RAD50, RAD51C, RAD51D, RNF43, SDHB, SDHC, SDHD, SMAD4, SMARCA4. STK11, TP53, TSC1, TSC2, and VHL.  The following genes were evaluated for sequence changes only: SDHA and HOXB13 c.251G>A variant only.  Results: Negative, no pathogenic variants identified. The report date is 09/09/2018.     09/22/2018 Tumor Marker   Patient's tumor was tested for the following markers: CA-125 Results of the tumor marker test revealed 110   10/12/2018 Tumor Marker   Patient's tumor was tested for the following markers: CA-125 Results of the tumor marker test revealed 55.3   10/12/2018 Imaging   1. Mild decrease in pulmonary metastases, liver metastases, and metastatic abdominal and pelvic lymphadenopathy. 2. Decreased size of complex cystic lesion in left presacral region. 3. No new or progressive metastatic disease identified. 4. Stable small right inguinal hernia containing small portion of urinary bladder.   11/03/2018 Tumor Marker   Patient's tumor was tested for the following markers: CA-125 Results of the tumor marker test revealed 34.3    Genetic Testing   Patient has genetic testing done for HER2. Results revealed patient has the following: HER2 - negative from pathology from 07/26/18.   12/20/2018 Imaging   1. Possible new omental nodule. 2. Improving  abdominal and pelvic retroperitoneal adenopathy. 3. Stable pulmonary and hepatic metastatic disease. 4. Left perirectal/presacral fluid collection has decreased in size. 5. Right inguinal hernia contains a small portion of the bladder, as before. 6. Aortic atherosclerosis (ICD10-170.0). Coronary artery calcification   12/21/2018 Tumor Marker   Patient's tumor was tested for the following markers: CA-125 Results of the tumor marker test revealed 15.6   01/15/2019 Imaging   Ct abdomen and pelvis 1. Small hiatal hernia  with fluid in the distal esophagus, which may represent reflux or esophagitis. No other acute findings. 2. Right inguinal hernia contains anterolateral aspect of the urinary bladder, unchanged in appearance from prior. Mild edema within the hernia sac is similar. 3. Unchanged small low-density lesions in the liver. 4. Unchanged retroperitoneal and bilateral iliac lymph nodes. Small ventral abdominal omental nodule tentatively identified and unchanged from prior.   Aortic Atherosclerosis (ICD10-I70.0).   02/02/2019 Tumor Marker   Patient's tumor was tested for the following markers: CA-125 Results of the tumor marker test revealed 7.8   03/22/2019 Surgery   Preoperative diagnosis: History of incarcerated right inguinal hernia   Postop diagnosis: Incarcerated right inguinal hernia with preperitoneal fat without strangulation or obstruction   Procedure: Repair of right inguinal hernia with ultra Pro hernia system mesh   Surgeon: Erroll Luna, MD   03/31/2019 Tumor Marker   Patient's tumor was tested for the following markers: CA-`125 Results of the tumor marker test revealed 24.4.   05/25/2019 Tumor Marker   Patient's tumor was tested for the following markers: CA-125 Results of the tumor marker test revealed 287   05/25/2019 Imaging   New solid bilateral adnexal masses, consistent with metastatic disease.   New peritoneal soft tissue nodules in the anterior right pelvis, suspicious for peritoneal carcinomatosis. No evidence of ascites.   No significant change in shotty sub-cm abdominal retroperitoneal and bilateral iliac lymph nodes.   06/09/2019 - 01/17/2020 Chemotherapy   The patient had pembrolizumab (KEYTRUDA) 200 mg in sodium chloride 0.9 % 50 mL chemo infusion, 200 mg, Intravenous, Once, 11 of 12 cycles Administration: 200 mg (06/09/2019), 200 mg (07/26/2019), 200 mg (08/16/2019), 200 mg (10/04/2019), 200 mg (06/30/2019), 200 mg (09/06/2019), 200 mg (10/25/2019), 200 mg (11/15/2019), 200  mg (12/06/2019), 200 mg (12/27/2019), 200 mg (01/17/2020)  for chemotherapy treatment.    06/09/2019 Tumor Marker   Patient's tumor was tested for the following markers: CA-125 Results of the tumor marker test revealed 468   07/26/2019 Tumor Marker   Patient's tumor was tested for the following markers: CA-125 Results of the tumor marker test revealed 282   08/17/2019 Tumor Marker   Patient's tumor was tested for the following markers: CA-125 Results of the tumor marker test revealed 190.   09/05/2019 Imaging   1. Subtle nodularity along the transverse mesocolon measuring 11 mm is concerning for peritoneal carcinomatosis. Also associated with perihepatic ascites and enlarging juxta diaphragmatic nodal disease.  Attention on follow-up. 2. Bilateral adnexal masses are similar in size to the previous exam. 3. Left para-aortic lymph node is slightly increased in size from the previous exam, slight interval enlargement is suspicious based on location for nodal involvement. Scattered subcentimeter lymph nodes throughout the left and right pelvis are similar to the previous exam. 4. Calcified coronary artery disease. 5. Low-density lesions in the liver are unchanged. 6. Persistent endometrial thickening not well assessed on CT. 7. Stranding in the right inguinal region tracking towards the abdominal wall likely related to prior inguinal hernia repair is unchanged. Area  of discrete nodularity in this location is no longer seen.   Aortic Atherosclerosis (ICD10-I70.0).       09/07/2019 Tumor Marker   Patient's tumor was tested for the following markers: CA-125 Results of the tumor marker test revealed 214   11/08/2019 Imaging   1. Multiple mild-to-moderate severity thickened small bowel loops throughout the abdomen with associated peri intestinal inflammatory fat stranding. While this may represent an infectious or inflammatory enteritis, the presence of an underlying neoplastic process cannot be  excluded. 2. Large amount of perihepatic fluid. 3. Stable bilateral adnexal masses, likely ovarian in origin. Correlation with pelvic ultrasound is recommended. 4. Small foci of parenchymal low attenuation within the right lobe of the liver which may represent small cysts or hemangiomas. 5. Stable left renal cyst. 6. Aortic atherosclerosis.   Aortic Atherosclerosis (ICD10-I70.0).     11/16/2019 Tumor Marker   Patient's tumor was tested for the following markers: CA-125 Results of the tumor marker test revealed 456   12/07/2019 Tumor Marker   Patient's tumor was tested for the following markers: CA-125. Results of the tumor marker test revealed 676   01/17/2020 Tumor Marker   Patient's tumor was tested for the following markers: CA-125 Results of the tumor marker test revealed 1333   01/26/2020 Imaging   Increased peritoneal carcinomatosis, mild ascites, and tiny right pleural effusion. Increased size of bilateral adnexal masses, consistent with metastatic disease.   New soft tissue density involving the right anterior cervix and right bladder base, consistent with metastatic disease.   Mildly increased abdominal and pelvic lymphadenopathy, consistent with metastatic disease.   Aortic Atherosclerosis (ICD10-I70.0).     02/15/2020 -  Chemotherapy   The patient had carboplatin for chemotherapy treatment.     02/15/2020 Tumor Marker   Patient's tumor was tested for the following markers: CA-125 Results of the tumor marker test revealed 1784   03/07/2020 Tumor Marker   Patient's tumor was tested for the following markers: CA-125 Results of the tumor marker test revealed 1037     REVIEW OF SYSTEMS:   Constitutional: Denies fevers, chills or abnormal weight loss Eyes: Denies blurriness of vision Ears, nose, mouth, throat, and face: Denies mucositis or sore throat Respiratory: Denies cough, dyspnea or wheezes Cardiovascular: Denies palpitation, chest discomfort or lower extremity  swelling Gastrointestinal:  Denies nausea, heartburn or change in bowel habits Skin: Denies abnormal skin rashes Lymphatics: Denies new lymphadenopathy or easy bruising Neurological:Denies numbness, tingling or new weaknesses Behavioral/Psych: Mood is stable, no new changes  All other systems were reviewed with the patient and are negative.  I have reviewed the past medical history, past surgical history, social history and family history with the patient and they are unchanged from previous note.  ALLERGIES:  is allergic to zofran [ondansetron hcl].  MEDICATIONS:  Current Outpatient Medications  Medication Sig Dispense Refill  . acetaminophen (TYLENOL) 500 MG tablet Take 1 tablet (500 mg total) by mouth every 6 (six) hours as needed. (Patient taking differently: Take 1,000 mg by mouth every 6 (six) hours as needed for moderate pain. ) 30 tablet 3  . Calcium Carbonate (CALTRATE 600 PO) Take 1 tablet by mouth 2 (two) times daily.     . enalapril (VASOTEC) 20 MG tablet Take 1 tablet (20 mg total) by mouth daily. 90 tablet 1  . Fluticasone-Salmeterol (ADVAIR DISKUS) 100-50 MCG/DOSE AEPB USE 1 INHALATION BY MOUTH TWICE DAILY 60 each 2  . furosemide (LASIX) 40 MG tablet Take 1 tablet (40 mg total) by mouth  daily as needed (leg swelling). 90 tablet 1  . levothyroxine (SYNTHROID) 112 MCG tablet Take 1 tablet (112 mcg total) by mouth daily before breakfast. 30 tablet 11  . lidocaine-prilocaine (EMLA) cream Apply to affected area once (Patient taking differently: Apply 1 application topically daily as needed. Apply to affected area once) 30 g 3  . Multiple Vitamin (MULTIVITAMIN) tablet Take 1 tablet by mouth daily.    . Multiple Vitamins-Minerals (ICAPS AREDS 2 PO) Take 1 capsule by mouth 2 (two) times daily.    . ondansetron (ZOFRAN) 8 MG tablet Take 1 tablet (8 mg total) by mouth every 8 (eight) hours as needed. Start on the third day after chemotherapy. 30 tablet 1  . oxyCODONE (OXY IR/ROXICODONE)  5 MG immediate release tablet Take 1 tablet (5 mg total) by mouth every 6 (six) hours as needed for severe pain. 60 tablet 0  . polyethylene glycol (MIRALAX / GLYCOLAX) 17 g packet Take 17 g by mouth daily as needed for moderate constipation.    . prochlorperazine (COMPAZINE) 10 MG tablet Take 1 tablet (10 mg total) by mouth every 6 (six) hours as needed (Nausea or vomiting). 60 tablet 1  . rivaroxaban (XARELTO) 20 MG TABS tablet Take 1 tablet (20 mg total) by mouth daily with supper. 90 tablet 3  . rosuvastatin (CRESTOR) 10 MG tablet Take 1 tablet (10 mg total) by mouth daily. Start with 87m for the first week. 90 tablet 0  . Vitamin D, Cholecalciferol, 1000 units CAPS Take 1,000 Units by mouth daily.      No current facility-administered medications for this visit.    PHYSICAL EXAMINATION: ECOG PERFORMANCE STATUS: 2 - Symptomatic, <50% confined to bed  Vitals:   03/19/20 0954  BP: (!) 125/53  Pulse: 82  Resp: 18  Temp: 97.9 F (36.6 C)  SpO2: 100%   Filed Weights   03/19/20 0954  Weight: 211 lb 9.6 oz (96 kg)    GENERAL:alert, no distress and comfortable SKIN: skin color, texture, turgor are normal, no rashes or significant lesions EYES: normal, Conjunctiva are pink and non-injected, sclera clear OROPHARYNX:no exudate, no erythema and lips, buccal mucosa, and tongue normal  NECK: supple, thyroid normal size, non-tender, without nodularity LYMPH:  no palpable lymphadenopathy in the cervical, axillary or inguinal LUNGS: clear to auscultation and percussion with normal breathing effort HEART: regular rate & rhythm and no murmurs with chronic bilateral lower extremity edema ABDOMEN:abdomen soft, non-tender and normal bowel sounds Musculoskeletal:no cyanosis of digits and no clubbing  NEURO: alert & oriented x 3 with fluent speech, no focal motor/sensory deficits  LABORATORY DATA:  I have reviewed the data as listed    Component Value Date/Time   NA 137 03/07/2020 1042   NA  142 12/25/2014 1122   K 4.3 03/07/2020 1042   K 4.1 12/25/2014 1122   CL 106 03/07/2020 1042   CL 102 10/04/2012 1159   CO2 29 03/07/2020 1042   CO2 29 12/25/2014 1122   GLUCOSE 84 03/07/2020 1042   GLUCOSE 99 12/25/2014 1122   GLUCOSE 116 (H) 10/04/2012 1159   BUN 14 03/07/2020 1042   BUN 13.7 12/25/2014 1122   CREATININE 0.88 03/07/2020 1042   CREATININE 0.96 (H) 02/27/2016 1530   CREATININE 0.9 12/25/2014 1122   CALCIUM 9.4 03/07/2020 1042   CALCIUM 10.1 12/25/2014 1122   PROT 6.8 03/07/2020 1042   PROT 7.8 12/25/2014 1122   ALBUMIN 3.0 (L) 03/07/2020 1042   ALBUMIN 3.4 (L) 12/25/2014 1122  AST 17 03/07/2020 1042   AST 21 12/25/2014 1122   ALT 7 03/07/2020 1042   ALT 17 12/25/2014 1122   ALKPHOS 65 03/07/2020 1042   ALKPHOS 99 12/25/2014 1122   BILITOT 0.3 03/07/2020 1042   BILITOT 0.41 12/25/2014 1122   GFRNONAA >60 03/07/2020 1042   GFRNONAA 58 (L) 02/27/2016 1530   GFRAA >60 02/15/2020 0940   GFRAA 66 02/27/2016 1530    No results found for: SPEP, UPEP  Lab Results  Component Value Date   WBC 7.0 03/19/2020   NEUTROABS 5.1 03/19/2020   HGB 8.1 (L) 03/19/2020   HCT 24.8 (L) 03/19/2020   MCV 102.9 (H) 03/19/2020   PLT 310 03/19/2020      Chemistry      Component Value Date/Time   NA 137 03/07/2020 1042   NA 142 12/25/2014 1122   K 4.3 03/07/2020 1042   K 4.1 12/25/2014 1122   CL 106 03/07/2020 1042   CL 102 10/04/2012 1159   CO2 29 03/07/2020 1042   CO2 29 12/25/2014 1122   BUN 14 03/07/2020 1042   BUN 13.7 12/25/2014 1122   CREATININE 0.88 03/07/2020 1042   CREATININE 0.96 (H) 02/27/2016 1530   CREATININE 0.9 12/25/2014 1122      Component Value Date/Time   CALCIUM 9.4 03/07/2020 1042   CALCIUM 10.1 12/25/2014 1122   ALKPHOS 65 03/07/2020 1042   ALKPHOS 99 12/25/2014 1122   AST 17 03/07/2020 1042   AST 21 12/25/2014 1122   ALT 7 03/07/2020 1042   ALT 17 12/25/2014 1122   BILITOT 0.3 03/07/2020 1042   BILITOT 0.41 12/25/2014 1122

## 2020-03-19 NOTE — Assessment & Plan Note (Signed)
She has multifactorial anemia I am rechecking serum vitamin B12 and iron studies to see if there are other attributable causes She will return next week to get hemoglobin recheck and if hemoglobin is less than 8, we will order a unit of blood

## 2020-03-19 NOTE — Patient Instructions (Signed)
[facility] Discharge Instructions for Patients Receiving Chemotherapy  Today you received the following chemotherapy agents: Carboplatin.  To help prevent nausea and vomiting after your treatment, we encourage you to take your nausea medication as prescribed.   If you develop nausea and vomiting that is not controlled by your nausea medication, call the clinic.   BELOW ARE SYMPTOMS THAT SHOULD BE REPORTED IMMEDIATELY:  *FEVER GREATER THAN 100.5 F  *CHILLS WITH OR WITHOUT FEVER  NAUSEA AND VOMITING THAT IS NOT CONTROLLED WITH YOUR NAUSEA MEDICATION  *UNUSUAL SHORTNESS OF BREATH  *UNUSUAL BRUISING OR BLEEDING  TENDERNESS IN MOUTH AND THROAT WITH OR WITHOUT PRESENCE OF ULCERS  *URINARY PROBLEMS  *BOWEL PROBLEMS  UNUSUAL RASH Items with * indicate a potential emergency and should be followed up as soon as possible.  Feel free to call the clinic should you have any questions or concerns. The clinic phone number is [phone].  Please show the CHEMO ALERT CARD at check-in to the Emergency Department and triage nurse.     

## 2020-03-19 NOTE — Assessment & Plan Note (Signed)
Her serum creatinine is satisfactory We discussed the importance of adequate hydration and risk factor modification

## 2020-03-19 NOTE — Assessment & Plan Note (Addendum)
Her blood counts improved with recent break However, given her profound chronic anemia, I suspect she might need blood transfusion next week I will bring her back to get a CBC recheck and if her hemoglobin is less than 8, we will give her a unit of blood I plan to also space out her treatment to every 4 weeks After cycle 3 of treatment, I plan to repeat imaging study, probably due around first week of December She will get reduced dose carboplatin at AUC of 4

## 2020-03-20 ENCOUNTER — Telehealth: Payer: Self-pay

## 2020-03-20 NOTE — Telephone Encounter (Signed)
She called and left a message to remove Vitamin D from medication list.  Called back. Removed Vitamin D from medication list.

## 2020-03-21 IMAGING — CT CT ABDOMEN AND PELVIS WITH CONTRAST
1 of 3 series · 13 of 32 positions shown, 18 images · IV contrast (omnipaque)
Comparison: Abdominal CT 12/20/2018

CLINICAL DATA: Abdominal pain and weakness. Active chemotherapy.
Hernia, complicated

EXAM:
CT ABDOMEN AND PELVIS WITH CONTRAST
TECHNIQUE: Multidetector CT imaging of the abdomen and pelvis was performed
using the standard protocol following bolus administration of
intravenous contrast.
CONTRAST:  100mL OMNIPAQUE IOHEXOL 300 MG/ML  SOLN

[Series 2: axial st · axial · 0.78mm/px · z∈[-382,+8]mm · 13 of 91 slices shown, 18 images]
[im 7/91  soft-tissue]
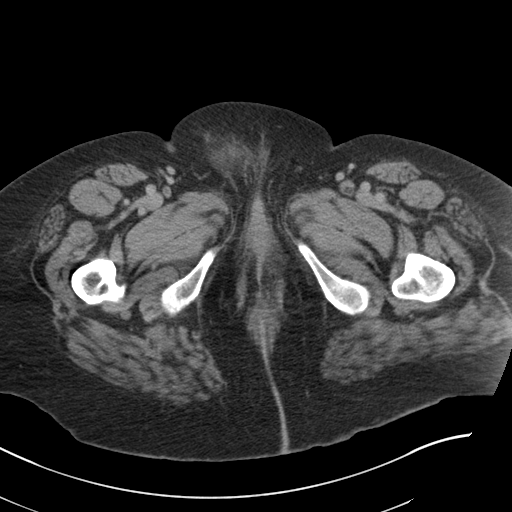
[im 7/91  bone]
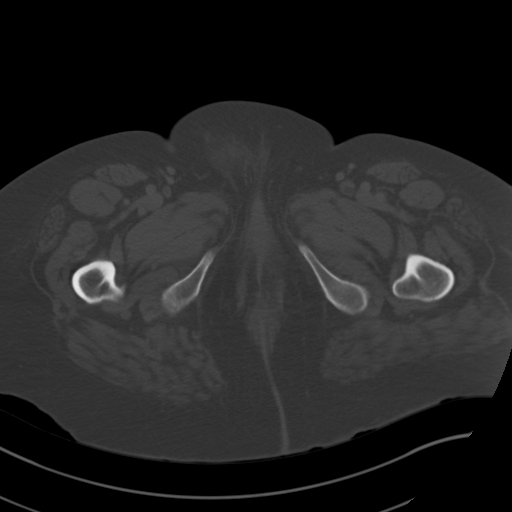
[im 13/91  soft-tissue]
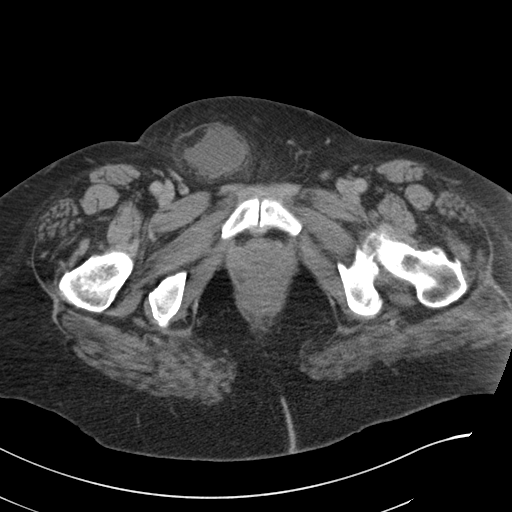
[im 19/91  soft-tissue]
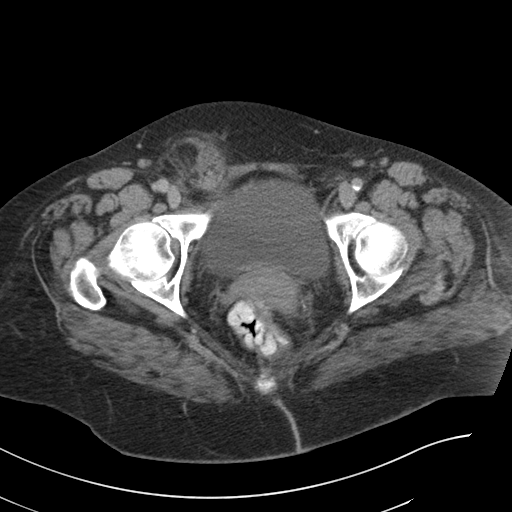
[im 31/91  soft-tissue]
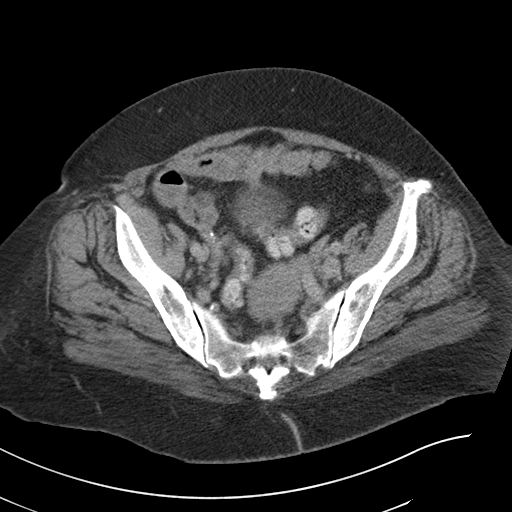
[im 37/91  soft-tissue]
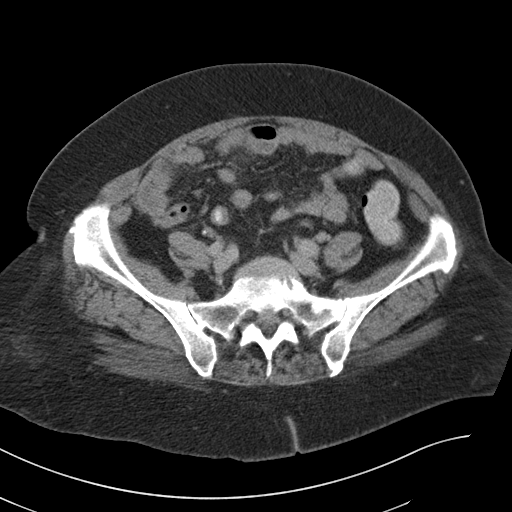
[im 43/91  soft-tissue]
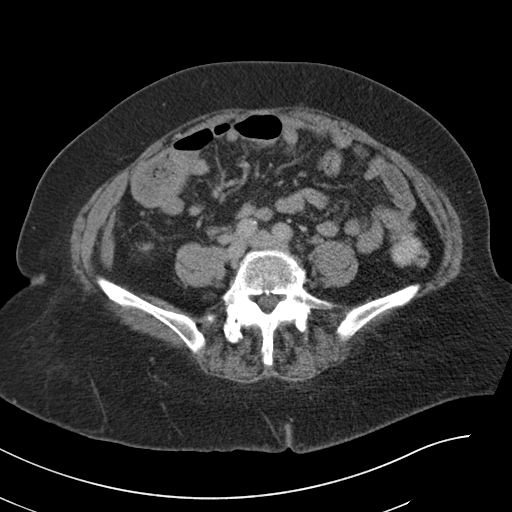
[im 49/91  soft-tissue]
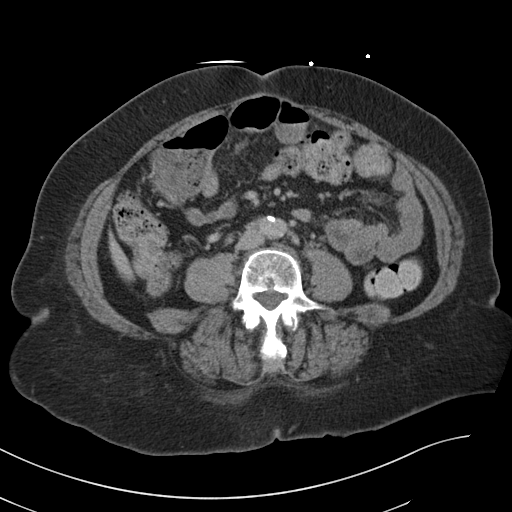
[im 55/91  soft-tissue]
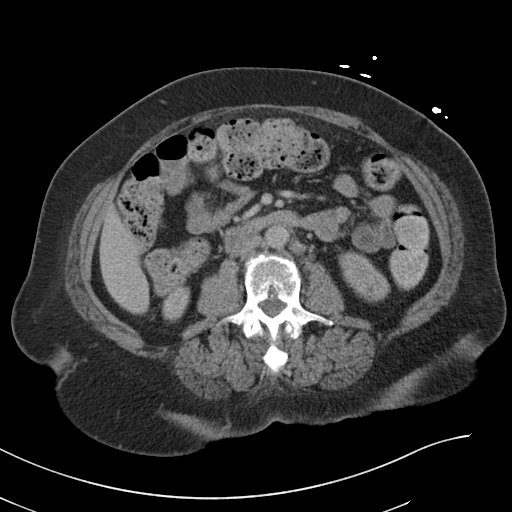
[im 61/91  soft-tissue]
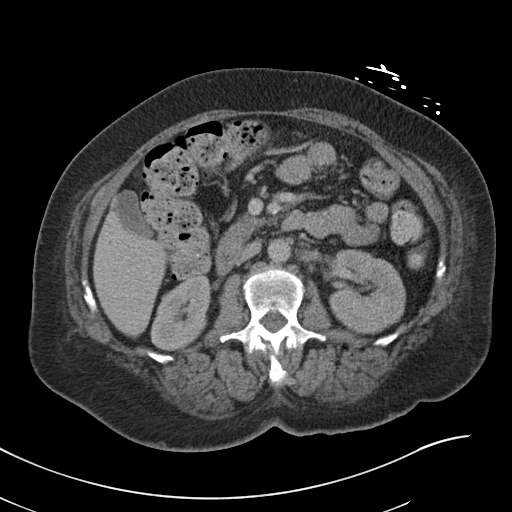
[im 61/91  bone]
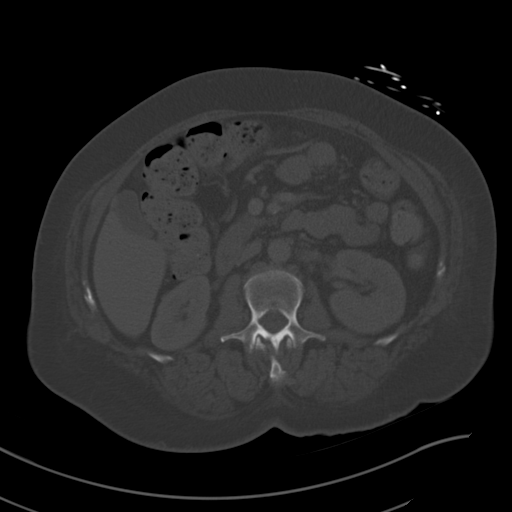
[im 67/91  lung]
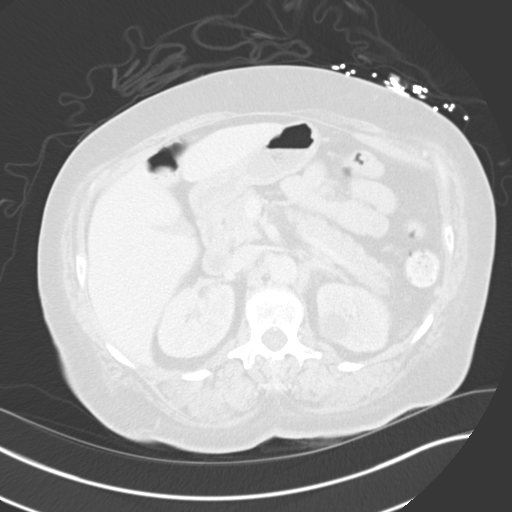
[im 73/91  soft-tissue]
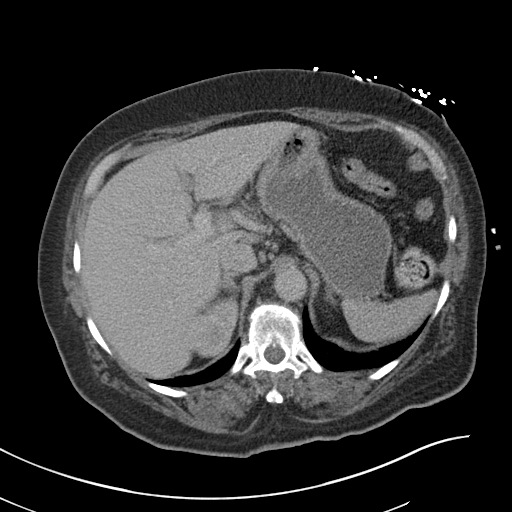
[im 73/91  lung]
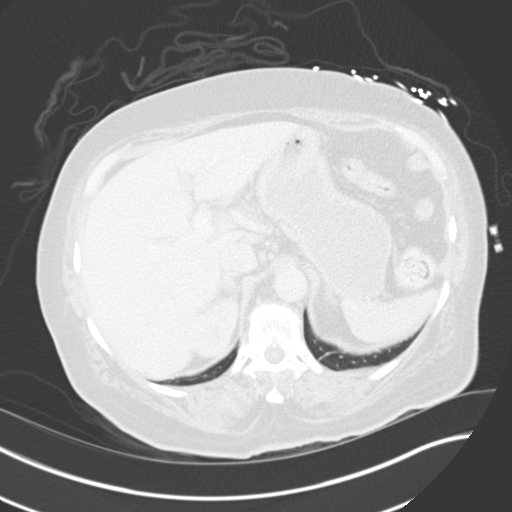
[im 79/91  soft-tissue]
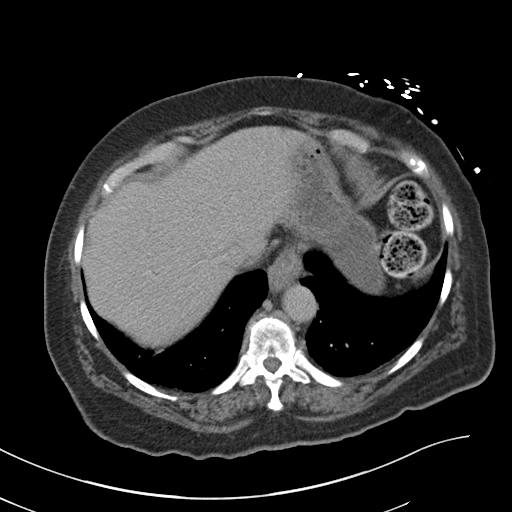
[im 79/91  lung]
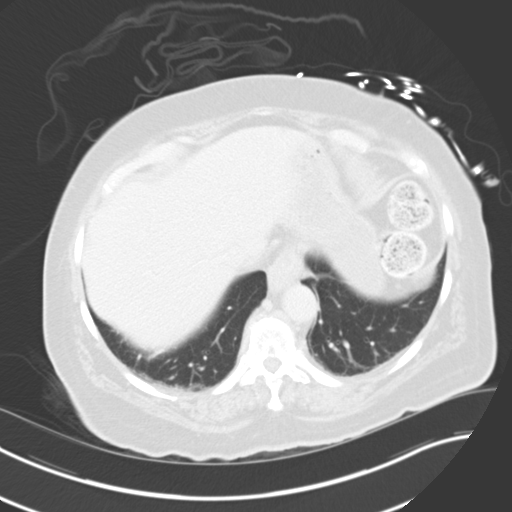
[im 85/91  soft-tissue]
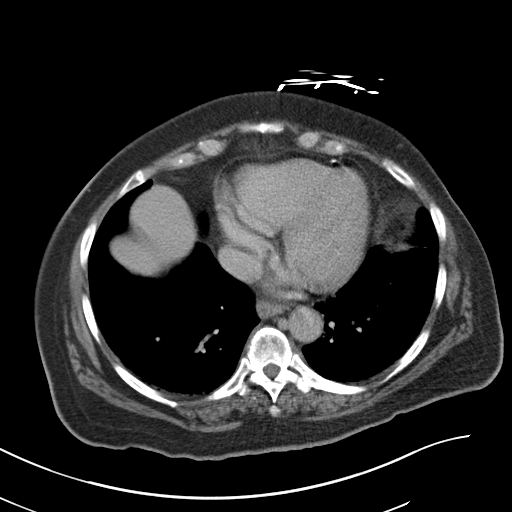
[im 85/91  lung]
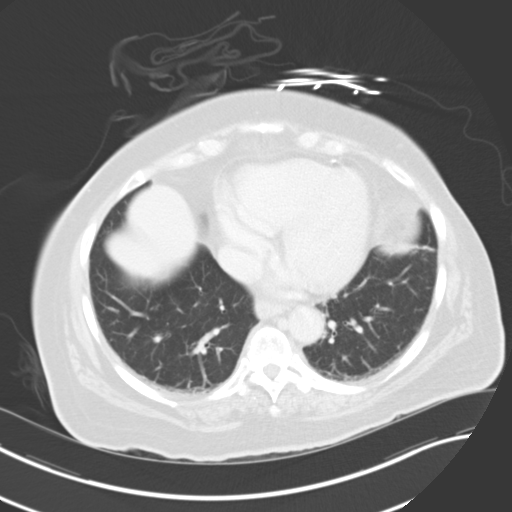

[13 of 32 positions shown; findings below may reference images not displayed]

FINDINGS: Lower chest: Hypoventilatory changes and scattered atelectasis.
Coronary artery calcifications. No pleural fluid.

Hepatobiliary: Small subcentimeter low-density lesions scattered
throughout the liver are unchanged from prior exam. No new hepatic
lesion. Gallbladder physiologically distended, no calcified stone.
No biliary dilatation.

Pancreas: No ductal dilatation or inflammation.

Spleen: Normal in size without focal abnormality.

Adrenals/Urinary Tract: No adrenal nodule. No hydronephrosis or
perinephric edema. Symmetric excretion on delayed phase imaging.
Simple cyst in the left kidney is again seen.

Urinary bladder is physiologically distended. The right aspect of
the bladder dome extends into right inguinal hernia, mild
surrounding soft tissue edema, findings similar to prior exam.

Stomach/Bowel: Stomach distended with fluid/ingested material. Small
hiatal hernia with fluid in the distal esophagus. No small bowel
wall thickening, inflammatory change, or obstruction. Normal
appendix. High-riding cecum in the right mid abdomen. Moderate
volume of stool in the colon. No colonic wall thickening or
inflammation.

Vascular/Lymphatic: Aortoiliac atherosclerosis. No aneurysm.
Prominent retroperitoneal and bilateral iliac nodes are similar to
recent exam. For example left periaortic node measures 9 mm, series
2, image 33. Left external iliac node measures 15 mm, image 67,
unchanged. No progressive or new adenopathy.

Reproductive: Uterus unchanged appearance, slightly bulbous. No
adnexal mass.

Other: A questionable ventral omental nodule on prior exam is
tentatively visualized, image 41 series 2. Right inguinal hernia
contains anterolateral aspect of the bladder, unchanged. Previous
left perirectal fluid has resolved. No free air. No new ascites.

Musculoskeletal: Multilevel degenerative change in the spine. There
are no acute or suspicious osseous abnormalities.
IMPRESSION: 1. Small hiatal hernia with fluid in the distal esophagus, which may
represent reflux or esophagitis. No other acute findings.
2. Right inguinal hernia contains anterolateral aspect of the
urinary bladder, unchanged in appearance from prior. Mild edema
within the hernia sac is similar.
3. Unchanged small low-density lesions in the liver.
4. Unchanged retroperitoneal and bilateral iliac lymph nodes. Small
ventral abdominal omental nodule tentatively identified and
unchanged from prior.

Aortic Atherosclerosis ([id number].X).

## 2020-03-28 ENCOUNTER — Inpatient Hospital Stay: Payer: Medicare Other

## 2020-03-28 ENCOUNTER — Other Ambulatory Visit: Payer: Self-pay

## 2020-03-28 ENCOUNTER — Other Ambulatory Visit: Payer: Self-pay | Admitting: Hematology and Oncology

## 2020-03-28 ENCOUNTER — Ambulatory Visit: Payer: Medicare Other | Admitting: Hematology and Oncology

## 2020-03-28 ENCOUNTER — Other Ambulatory Visit: Payer: Medicare Other

## 2020-03-28 ENCOUNTER — Ambulatory Visit: Payer: Medicare Other

## 2020-03-28 ENCOUNTER — Telehealth: Payer: Self-pay

## 2020-03-28 VITALS — BP 116/68 | HR 61 | Temp 98.0°F | Resp 18

## 2020-03-28 DIAGNOSIS — Z5111 Encounter for antineoplastic chemotherapy: Secondary | ICD-10-CM | POA: Diagnosis not present

## 2020-03-28 DIAGNOSIS — Z452 Encounter for adjustment and management of vascular access device: Secondary | ICD-10-CM | POA: Diagnosis not present

## 2020-03-28 DIAGNOSIS — N183 Chronic kidney disease, stage 3 unspecified: Secondary | ICD-10-CM | POA: Diagnosis not present

## 2020-03-28 DIAGNOSIS — R946 Abnormal results of thyroid function studies: Secondary | ICD-10-CM

## 2020-03-28 DIAGNOSIS — Z23 Encounter for immunization: Secondary | ICD-10-CM | POA: Diagnosis not present

## 2020-03-28 DIAGNOSIS — D61810 Antineoplastic chemotherapy induced pancytopenia: Secondary | ICD-10-CM | POA: Diagnosis not present

## 2020-03-28 DIAGNOSIS — D539 Nutritional anemia, unspecified: Secondary | ICD-10-CM

## 2020-03-28 DIAGNOSIS — C55 Malignant neoplasm of uterus, part unspecified: Secondary | ICD-10-CM

## 2020-03-28 LAB — CMP (CANCER CENTER ONLY)
ALT: 8 U/L (ref 0–44)
AST: 23 U/L (ref 15–41)
Albumin: 3.2 g/dL — ABNORMAL LOW (ref 3.5–5.0)
Alkaline Phosphatase: 71 U/L (ref 38–126)
Anion gap: 4 — ABNORMAL LOW (ref 5–15)
BUN: 14 mg/dL (ref 8–23)
CO2: 28 mmol/L (ref 22–32)
Calcium: 9.9 mg/dL (ref 8.9–10.3)
Chloride: 105 mmol/L (ref 98–111)
Creatinine: 0.86 mg/dL (ref 0.44–1.00)
GFR, Estimated: >60 mL/min (ref >60)
Glucose, Bld: 89 mg/dL (ref 70–99)
Potassium: 4.5 mmol/L (ref 3.5–5.1)
Sodium: 137 mmol/L (ref 135–145)
Total Bilirubin: 0.2 mg/dL — ABNORMAL LOW (ref 0.3–1.2)
Total Protein: 7 g/dL (ref 6.5–8.1)

## 2020-03-28 LAB — PREPARE RBC (CROSSMATCH)

## 2020-03-28 LAB — SAMPLE TO BLOOD BANK

## 2020-03-28 LAB — CBC WITH DIFFERENTIAL (CANCER CENTER ONLY)
Abs Immature Granulocytes: 0.03 10*3/uL (ref 0.00–0.07)
Basophils Absolute: 0.1 10*3/uL (ref 0.0–0.1)
Basophils Relative: 1 %
Eosinophils Absolute: 0.6 10*3/uL — ABNORMAL HIGH (ref 0.0–0.5)
Eosinophils Relative: 9 %
HCT: 24.1 % — ABNORMAL LOW (ref 36.0–46.0)
Hemoglobin: 7.8 g/dL — ABNORMAL LOW (ref 12.0–15.0)
Immature Granulocytes: 1 %
Lymphocytes Relative: 16 %
Lymphs Abs: 1 10*3/uL (ref 0.7–4.0)
MCH: 33.2 pg (ref 26.0–34.0)
MCHC: 32.4 g/dL (ref 30.0–36.0)
MCV: 102.6 fL — ABNORMAL HIGH (ref 80.0–100.0)
Monocytes Absolute: 0.8 10*3/uL (ref 0.1–1.0)
Monocytes Relative: 12 %
Neutro Abs: 3.8 10*3/uL (ref 1.7–7.7)
Neutrophils Relative %: 61 %
Platelet Count: 245 10*3/uL (ref 150–400)
RBC: 2.35 MIL/uL — ABNORMAL LOW (ref 3.87–5.11)
RDW: 15.4 % (ref 11.5–15.5)
WBC Count: 6.2 10*3/uL (ref 4.0–10.5)
nRBC: 0 % (ref 0.0–0.2)

## 2020-03-28 LAB — TSH: TSH: 11.406 u[IU]/mL — ABNORMAL HIGH (ref 0.308–3.960)

## 2020-03-28 MED ORDER — ACETAMINOPHEN 325 MG PO TABS
650.0000 mg | ORAL_TABLET | Freq: Once | ORAL | Status: AC
  Administered 2020-03-28: 650 mg via ORAL

## 2020-03-28 MED ORDER — SODIUM CHLORIDE 0.9% FLUSH
3.0000 mL | INTRAVENOUS | Status: DC | PRN
  Filled 2020-03-28: qty 10

## 2020-03-28 MED ORDER — SODIUM CHLORIDE 0.9% IV SOLUTION
250.0000 mL | Freq: Once | INTRAVENOUS | Status: AC
  Administered 2020-03-28: 250 mL via INTRAVENOUS
  Filled 2020-03-28: qty 250

## 2020-03-28 MED ORDER — ACETAMINOPHEN 325 MG PO TABS
ORAL_TABLET | ORAL | Status: AC
  Filled 2020-03-28: qty 2

## 2020-03-28 MED ORDER — HEPARIN SOD (PORK) LOCK FLUSH 100 UNIT/ML IV SOLN
500.0000 [IU] | Freq: Every day | INTRAVENOUS | Status: AC | PRN
  Administered 2020-03-28: 500 [IU]
  Filled 2020-03-28: qty 5

## 2020-03-28 MED ORDER — SODIUM CHLORIDE 0.9% FLUSH
10.0000 mL | INTRAVENOUS | Status: AC | PRN
  Administered 2020-03-28: 10 mL
  Filled 2020-03-28: qty 10

## 2020-03-28 MED ORDER — DIPHENHYDRAMINE HCL 25 MG PO CAPS
25.0000 mg | ORAL_CAPSULE | Freq: Once | ORAL | Status: AC
  Administered 2020-03-28: 25 mg via ORAL

## 2020-03-28 MED ORDER — DIPHENHYDRAMINE HCL 25 MG PO CAPS
ORAL_CAPSULE | ORAL | Status: AC
  Filled 2020-03-28: qty 2

## 2020-03-28 MED ORDER — LEVOTHYROXINE SODIUM 125 MCG PO TABS: 125.0000 ug | ORAL_TABLET | Freq: Every day | ORAL | 11 refills | Status: AC

## 2020-03-28 MED ORDER — SODIUM CHLORIDE 0.9% FLUSH
10.0000 mL | Freq: Once | INTRAVENOUS | Status: AC
  Administered 2020-03-28: 10 mL
  Filled 2020-03-28: qty 10

## 2020-03-28 NOTE — Patient Instructions (Signed)

## 2020-03-28 NOTE — Telephone Encounter (Signed)
Called daughter and instructed per Dr. Alvy Bimler. TSH is higher. A new Rx for synthroid 125 mcg has been sent to [facility]. She verbalized understanding.

## 2020-03-29 LAB — TYPE AND SCREEN
ABO/RH(D): O POS
Antibody Screen: NEGATIVE
Unit division: 0

## 2020-03-29 LAB — BPAM RBC
Blood Product Expiration Date: 202111182359
ISSUE DATE / TIME: 202110271409
Unit Type and Rh: 5100

## 2020-04-16 ENCOUNTER — Other Ambulatory Visit: Payer: Self-pay

## 2020-04-16 ENCOUNTER — Inpatient Hospital Stay: Payer: Medicare Other

## 2020-04-16 ENCOUNTER — Encounter: Payer: Self-pay | Admitting: Hematology and Oncology

## 2020-04-16 ENCOUNTER — Other Ambulatory Visit: Payer: Self-pay | Admitting: Hematology and Oncology

## 2020-04-16 ENCOUNTER — Inpatient Hospital Stay: Payer: Medicare Other | Attending: Gynecologic Oncology

## 2020-04-16 ENCOUNTER — Inpatient Hospital Stay (HOSPITAL_BASED_OUTPATIENT_CLINIC_OR_DEPARTMENT_OTHER): Payer: Medicare Other | Admitting: Hematology and Oncology

## 2020-04-16 ENCOUNTER — Telehealth: Payer: Self-pay | Admitting: Hematology and Oncology

## 2020-04-16 DIAGNOSIS — R590 Localized enlarged lymph nodes: Secondary | ICD-10-CM | POA: Diagnosis not present

## 2020-04-16 DIAGNOSIS — Z853 Personal history of malignant neoplasm of breast: Secondary | ICD-10-CM | POA: Insufficient documentation

## 2020-04-16 DIAGNOSIS — R946 Abnormal results of thyroid function studies: Secondary | ICD-10-CM | POA: Insufficient documentation

## 2020-04-16 DIAGNOSIS — N183 Chronic kidney disease, stage 3 unspecified: Secondary | ICD-10-CM | POA: Diagnosis not present

## 2020-04-16 DIAGNOSIS — Z7189 Other specified counseling: Secondary | ICD-10-CM

## 2020-04-16 DIAGNOSIS — Z5111 Encounter for antineoplastic chemotherapy: Secondary | ICD-10-CM | POA: Insufficient documentation

## 2020-04-16 DIAGNOSIS — I700 Atherosclerosis of aorta: Secondary | ICD-10-CM | POA: Insufficient documentation

## 2020-04-16 DIAGNOSIS — J90 Pleural effusion, not elsewhere classified: Secondary | ICD-10-CM | POA: Insufficient documentation

## 2020-04-16 DIAGNOSIS — T451X5A Adverse effect of antineoplastic and immunosuppressive drugs, initial encounter: Secondary | ICD-10-CM

## 2020-04-16 DIAGNOSIS — C55 Malignant neoplasm of uterus, part unspecified: Secondary | ICD-10-CM

## 2020-04-16 DIAGNOSIS — Z923 Personal history of irradiation: Secondary | ICD-10-CM | POA: Insufficient documentation

## 2020-04-16 DIAGNOSIS — Z79899 Other long term (current) drug therapy: Secondary | ICD-10-CM | POA: Insufficient documentation

## 2020-04-16 DIAGNOSIS — R971 Elevated cancer antigen 125 [CA 125]: Secondary | ICD-10-CM | POA: Insufficient documentation

## 2020-04-16 DIAGNOSIS — D6481 Anemia due to antineoplastic chemotherapy: Secondary | ICD-10-CM | POA: Diagnosis not present

## 2020-04-16 DIAGNOSIS — C786 Secondary malignant neoplasm of retroperitoneum and peritoneum: Secondary | ICD-10-CM | POA: Diagnosis not present

## 2020-04-16 DIAGNOSIS — D518 Other vitamin B12 deficiency anemias: Secondary | ICD-10-CM | POA: Diagnosis not present

## 2020-04-16 DIAGNOSIS — C7800 Secondary malignant neoplasm of unspecified lung: Secondary | ICD-10-CM | POA: Insufficient documentation

## 2020-04-16 DIAGNOSIS — Z7901 Long term (current) use of anticoagulants: Secondary | ICD-10-CM | POA: Diagnosis not present

## 2020-04-16 DIAGNOSIS — C787 Secondary malignant neoplasm of liver and intrahepatic bile duct: Secondary | ICD-10-CM | POA: Diagnosis not present

## 2020-04-16 DIAGNOSIS — D539 Nutritional anemia, unspecified: Secondary | ICD-10-CM

## 2020-04-16 DIAGNOSIS — E538 Deficiency of other specified B group vitamins: Secondary | ICD-10-CM | POA: Insufficient documentation

## 2020-04-16 LAB — CMP (CANCER CENTER ONLY)
ALT: 7 U/L (ref 0–44)
AST: 22 U/L (ref 15–41)
Albumin: 3.2 g/dL — ABNORMAL LOW (ref 3.5–5.0)
Alkaline Phosphatase: 68 U/L (ref 38–126)
Anion gap: 6 (ref 5–15)
BUN: 15 mg/dL (ref 8–23)
CO2: 26 mmol/L (ref 22–32)
Calcium: 9.3 mg/dL (ref 8.9–10.3)
Chloride: 106 mmol/L (ref 98–111)
Creatinine: 1.13 mg/dL — ABNORMAL HIGH (ref 0.44–1.00)
GFR, Estimated: 49 mL/min — ABNORMAL LOW (ref >60)
Glucose, Bld: 83 mg/dL (ref 70–99)
Potassium: 4 mmol/L (ref 3.5–5.1)
Sodium: 138 mmol/L (ref 135–145)
Total Bilirubin: 0.4 mg/dL (ref 0.3–1.2)
Total Protein: 7.2 g/dL (ref 6.5–8.1)

## 2020-04-16 LAB — CBC WITH DIFFERENTIAL (CANCER CENTER ONLY)
Abs Immature Granulocytes: 0.03 10*3/uL (ref 0.00–0.07)
Basophils Absolute: 0 10*3/uL (ref 0.0–0.1)
Basophils Relative: 0 %
Eosinophils Absolute: 0.5 10*3/uL (ref 0.0–0.5)
Eosinophils Relative: 7 %
HCT: 25.7 % — ABNORMAL LOW (ref 36.0–46.0)
Hemoglobin: 8.5 g/dL — ABNORMAL LOW (ref 12.0–15.0)
Immature Granulocytes: 0 %
Lymphocytes Relative: 11 %
Lymphs Abs: 0.8 10*3/uL (ref 0.7–4.0)
MCH: 33.6 pg (ref 26.0–34.0)
MCHC: 33.1 g/dL (ref 30.0–36.0)
MCV: 101.6 fL — ABNORMAL HIGH (ref 80.0–100.0)
Monocytes Absolute: 0.8 10*3/uL (ref 0.1–1.0)
Monocytes Relative: 11 %
Neutro Abs: 5.3 10*3/uL (ref 1.7–7.7)
Neutrophils Relative %: 71 %
Platelet Count: 187 10*3/uL (ref 150–400)
RBC: 2.53 MIL/uL — ABNORMAL LOW (ref 3.87–5.11)
RDW: 17.1 % — ABNORMAL HIGH (ref 11.5–15.5)
WBC Count: 7.5 10*3/uL (ref 4.0–10.5)
nRBC: 0 % (ref 0.0–0.2)

## 2020-04-16 LAB — SAMPLE TO BLOOD BANK

## 2020-04-16 MED ORDER — DIPHENHYDRAMINE HCL 50 MG/ML IJ SOLN
25.0000 mg | Freq: Once | INTRAMUSCULAR | Status: AC
  Administered 2020-04-16: 25 mg via INTRAVENOUS

## 2020-04-16 MED ORDER — DIPHENHYDRAMINE HCL 50 MG/ML IJ SOLN
INTRAMUSCULAR | Status: AC
  Filled 2020-04-16: qty 1

## 2020-04-16 MED ORDER — SODIUM CHLORIDE 0.9 % IV SOLN
Freq: Once | INTRAVENOUS | Status: AC
  Filled 2020-04-16: qty 250

## 2020-04-16 MED ORDER — SODIUM CHLORIDE 0.9 % IV SOLN
338.4000 mg | Freq: Once | INTRAVENOUS | Status: AC
  Administered 2020-04-16: 340 mg via INTRAVENOUS
  Filled 2020-04-16: qty 34

## 2020-04-16 MED ORDER — FAMOTIDINE IN NACL 20-0.9 MG/50ML-% IV SOLN
20.0000 mg | Freq: Once | INTRAVENOUS | Status: AC
  Administered 2020-04-16: 20 mg via INTRAVENOUS

## 2020-04-16 MED ORDER — FAMOTIDINE IN NACL 20-0.9 MG/50ML-% IV SOLN
INTRAVENOUS | Status: AC
  Filled 2020-04-16: qty 50

## 2020-04-16 MED ORDER — SODIUM CHLORIDE 0.9 % IV SOLN
10.0000 mg | Freq: Once | INTRAVENOUS | Status: AC
  Administered 2020-04-16: 10 mg via INTRAVENOUS
  Filled 2020-04-16: qty 10

## 2020-04-16 MED ORDER — SODIUM CHLORIDE 0.9% FLUSH
10.0000 mL | INTRAVENOUS | Status: DC | PRN
  Administered 2020-04-16: 10 mL
  Filled 2020-04-16: qty 10

## 2020-04-16 MED ORDER — PALONOSETRON HCL INJECTION 0.25 MG/5ML
INTRAVENOUS | Status: AC
  Filled 2020-04-16: qty 5

## 2020-04-16 MED ORDER — HEPARIN SOD (PORK) LOCK FLUSH 100 UNIT/ML IV SOLN
500.0000 [IU] | Freq: Once | INTRAVENOUS | Status: AC | PRN
  Administered 2020-04-16: 500 [IU]
  Filled 2020-04-16: qty 5

## 2020-04-16 MED ORDER — CYANOCOBALAMIN 1000 MCG/ML IJ SOLN
1000.0000 ug | Freq: Once | INTRAMUSCULAR | Status: AC
  Administered 2020-04-16: 1000 ug via INTRAMUSCULAR

## 2020-04-16 MED ORDER — PALONOSETRON HCL INJECTION 0.25 MG/5ML
0.2500 mg | Freq: Once | INTRAVENOUS | Status: AC
  Administered 2020-04-16: 0.25 mg via INTRAVENOUS

## 2020-04-16 MED ORDER — SODIUM CHLORIDE 0.9 % IV SOLN
150.0000 mg | Freq: Once | INTRAVENOUS | Status: AC
  Administered 2020-04-16: 150 mg via INTRAVENOUS
  Filled 2020-04-16: qty 150

## 2020-04-16 MED ORDER — CYANOCOBALAMIN 1000 MCG/ML IJ SOLN
INTRAMUSCULAR | Status: AC
  Filled 2020-04-16: qty 1

## 2020-04-16 NOTE — Assessment & Plan Note (Signed)
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

## 2020-04-16 NOTE — Assessment & Plan Note (Signed)
Her serum creatinine is satisfactory We discussed the importance of adequate hydration and risk factor modification

## 2020-04-16 NOTE — Assessment & Plan Note (Signed)
She has profound vitamin B12 deficiency She will receive vitamin B12 injection today and next week

## 2020-04-16 NOTE — Assessment & Plan Note (Signed)
Despite significant dose adjustment, she needs blood transfusion support She was also found to have vitamin B12 deficiency We will proceed with treatment as scheduled today and when she will return next week to get her hemoglobin rechecked and transfusion support as needed She will get reduced dose carboplatin at AUC of 4 I plan to repeat CT imaging in 2 weeks for further follow-up and objective assessment of response to therapy

## 2020-04-16 NOTE — Progress Notes (Signed)
[place] OFFICE PROGRESS NOTE  Patient Care Team: [name], DO as PCP - General (Family Medicine) [name], MD as Consulting Physician (Ophthalmology) [name], DPM as Consulting Physician (Podiatry) [name], [name], MD as Referring Physician (Optometry) [name], MD (Inactive) as Consulting Physician (Orthopedic Surgery) [name], RN as Oncology Nurse Navigator (Oncology)  ASSESSMENT & PLAN:  Uterine cancer [facility]) Despite significant dose adjustment, she needs blood transfusion support She was also found to have vitamin B12 deficiency We will proceed with treatment as scheduled today and when she will return next week to get her hemoglobin rechecked and transfusion support as needed She will get reduced dose carboplatin at AUC of 4 I plan to repeat CT imaging in 2 weeks for further follow-up and objective assessment of response to therapy  Vitamin B12 deficiency (dietary) anemia She has profound vitamin B12 deficiency She will receive vitamin B12 injection today and next week  Abnormal thyroid function test 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 I plan to recheck her TSH every other month now that she is off immunotherapy and adjust the dose of her Synthroid as needed  CKD (chronic kidney disease), stage III ([place]) Her serum creatinine is satisfactory We discussed the importance of adequate hydration and risk factor modification  Anemia due to antineoplastic chemotherapy She has multifactorial anemia, anemia chronic kidney disease, 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 and vitamin B12 deficiency She does not need transfusion support today but would likely need blood transfusion support next week I will bring her back next week and she will get 1 unit of blood if hemoglobin is less than 8   Orders Placed This Encounter  Procedures  . CT CHEST ABDOMEN PELVIS W CONTRAST    Standing Status:    Future    Standing Expiration Date:   04/16/2021    Order Specific Question:   Preferred imaging location?    Answer:   [facility]    Order Specific Question:   Radiology Contrast Protocol - do NOT remove file path    Answer:   [email or web address]    All questions were answered. The patient knows to call the clinic with any problems, questions or concerns. The total time spent in the appointment was 30 minutes encounter with patients including review of chart and various tests results, discussions about plan of care and coordination of care plan   [name], MD 04/16/2020 11:09 AM  INTERVAL HISTORY: Please see below for problem oriented charting. She returns for chemotherapy and follow-up She tolerated last cycle of therapy well Denies nausea or constipation She was found to have vitamin B12 deficiency recently She required blood transfusion support and vitamin B12 injections The patient denies any recent signs or symptoms of bleeding such as spontaneous epistaxis, hematuria or hematochezia. 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 Ultrasound irregular hypoechoic mass 7 x 6 x 5 mm   01/06/2012 Initial Biopsy   Initial biopsy was benign . 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 Cervix, biopsy - HIGH GRADE CARCINOMA. - SEE COMMENT. 2. Endocervix, curettage - HIGH GRADE CARCINOMA. - SEE COMMENT. 3. Endometrium, biopsy - HIGH GRADE CARCINOMA. - SEE COMMENT. Microscopic Comment 1. - 3. The carcinoma in the three specimens is morphologically similar and has features suggestive of papillary serous carcinoma.   07/26/2018 Tumor Marker   Patient's tumor was tested for the following markers: CA-125 Results of the tumor marker test revealed 835   07/28/2018 Imaging   1. Several (at least 10) solid pulmonary nodules scattered throughout both lungs are new since 2012 chest CT, likely representing pulmonary metastases, largest 6 mm, below PET resolution. 2. Redemonstration of hypodense 2.5 cm segment 7 right liver lobe mass, indeterminate, suspicious for liver metastasis. 3. 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 4. Three-vessel coronary atherosclerosis.  Aortic Atherosclerosis (ICD10-I70.0).   08/03/2018 Cancer Staging   Staging form: Corpus Uteri - Carcinoma and Carcinosarcoma, AJCC 8th Edition - Clinical: FIGO Stage IVB (cT3b, cN2, cM1) - Signed by [name], MD on 08/03/2018   08/05/2018 Tumor Marker   Patient's tumor was tested for the following markers: CA-125 Results of the tumor marker test revealed 901   08/06/2018 Procedure   Placement of single lumen port a cath via right internal jugular vein. The catheter tip lies at the cavo-atrial junction. A power injectable port a cath was placed and is ready for immediate use.    08/11/2018 - 01/12/2019 Chemotherapy   The patient had carboplatin and taxol x 8 cycles   09/01/2018 Tumor Marker   Patient's tumor was  tested for the following markers: CA-125 Results of the tumor marker test revealed 406   09/09/2018 Genetic Testing   The Common Hereditary Cancers Panel offered by [facility] includes sequencing and/or deletion duplication testing of the following 48 genes: APC, ATM, AXIN2, BARD1, BMPR1A, BRCA1, BRCA2, BRIP1, CDH1, CDKN2A (p14ARF), CDKN2A (p16INK4a), CKD4, CHEK2, CTNNA1, DICER1, EPCAM (Deletion/duplication testing only), GREM1 (promoter region deletion/duplication testing only), KIT, MEN1, MLH1, MSH2, MSH3, MSH6, MUTYH, NBN, NF1, NHTL1, PALB2, PDGFRA, PMS2, POLD1, POLE, PTEN, RAD50, RAD51C, RAD51D, RNF43, SDHB, SDHC, SDHD, SMAD4, SMARCA4. STK11, TP53, TSC1, TSC2, and VHL.  The following genes were evaluated for sequence changes only: SDHA and HOXB13 c.251G>A variant only.  Results: Negative, no pathogenic variants identified. The report date is 09/09/2018.     09/22/2018 Tumor Marker   Patient's tumor was tested for the following markers: CA-125 Results of the tumor marker test revealed 110   10/12/2018 Tumor Marker   Patient's tumor was tested for the following markers: CA-125 Results of the tumor marker test revealed 55.3   10/12/2018 Imaging   1. Mild decrease in pulmonary metastases, liver metastases, and metastatic abdominal and pelvic lymphadenopathy. 2. Decreased size of complex cystic lesion in left presacral region. 3. No new or progressive metastatic disease identified. 4. Stable small right inguinal hernia containing small portion of urinary bladder.   11/03/2018 Tumor Marker   Patient's tumor was  tested for the following markers: CA-125 Results of the tumor marker test revealed 34.3    Genetic Testing   Patient has genetic testing done for HER2. Results revealed patient has the following: HER2 - negative from pathology from 07/26/18.   12/20/2018 Imaging   1. Possible new omental nodule. 2. Improving abdominal and pelvic retroperitoneal adenopathy. 3. Stable pulmonary and hepatic  metastatic disease. 4. Left perirectal/presacral fluid collection has decreased in size. 5. Right inguinal hernia contains a small portion of the bladder, as before. 6. Aortic atherosclerosis (ICD10-170.0). Coronary artery calcification   12/21/2018 Tumor Marker   Patient's tumor was tested for the following markers: CA-125 Results of the tumor marker test revealed 15.6   01/15/2019 Imaging   Ct abdomen and pelvis 1. Small hiatal hernia with fluid in the distal esophagus, which may represent reflux or esophagitis. No other acute findings. 2. Right inguinal hernia contains anterolateral aspect of the urinary bladder, unchanged in appearance from prior. Mild edema within the hernia sac is similar. 3. Unchanged small low-density lesions in the liver. 4. Unchanged retroperitoneal and bilateral iliac lymph nodes. Small ventral abdominal omental nodule tentatively identified and unchanged from prior.   Aortic Atherosclerosis (ICD10-I70.0).   02/02/2019 Tumor Marker   Patient's tumor was tested for the following markers: CA-125 Results of the tumor marker test revealed 7.8   03/22/2019 Surgery   Preoperative diagnosis: History of incarcerated right inguinal hernia   Postop diagnosis: Incarcerated right inguinal hernia with preperitoneal fat without strangulation or obstruction   Procedure: Repair of right inguinal hernia with ultra Pro hernia system mesh   Surgeon: Erroll Luna, MD   03/31/2019 Tumor Marker   Patient's tumor was tested for the following markers: CA-`125 Results of the tumor marker test revealed 24.4.   05/25/2019 Tumor Marker   Patient's tumor was tested for the following markers: CA-125 Results of the tumor marker test revealed 287   05/25/2019 Imaging   New solid bilateral adnexal masses, consistent with metastatic disease.   New peritoneal soft tissue nodules in the anterior right pelvis, suspicious for peritoneal carcinomatosis. No evidence of ascites.   No  significant change in shotty sub-cm abdominal retroperitoneal and bilateral iliac lymph nodes.   06/09/2019 - 01/17/2020 Chemotherapy   The patient had pembrolizumab (KEYTRUDA) 200 mg in sodium chloride 0.9 % 50 mL chemo infusion, 200 mg, Intravenous, Once, 11 of 12 cycles Administration: 200 mg (06/09/2019), 200 mg (07/26/2019), 200 mg (08/16/2019), 200 mg (10/04/2019), 200 mg (06/30/2019), 200 mg (09/06/2019), 200 mg (10/25/2019), 200 mg (11/15/2019), 200 mg (12/06/2019), 200 mg (12/27/2019), 200 mg (01/17/2020)  for chemotherapy treatment.    06/09/2019 Tumor Marker   Patient's tumor was tested for the following markers: CA-125 Results of the tumor marker test revealed 468   07/26/2019 Tumor Marker   Patient's tumor was tested for the following markers: CA-125 Results of the tumor marker test revealed 282   08/17/2019 Tumor Marker   Patient's tumor was tested for the following markers: CA-125 Results of the tumor marker test revealed 190.   09/05/2019 Imaging   1. Subtle nodularity along the transverse mesocolon measuring 11 mm is concerning for peritoneal carcinomatosis. Also associated with perihepatic ascites and enlarging juxta diaphragmatic nodal disease.  Attention on follow-up. 2. Bilateral adnexal masses are similar in size to the previous exam. 3. Left para-aortic lymph node is slightly increased in size from the previous exam, slight interval enlargement is suspicious based on location for nodal involvement. Scattered subcentimeter lymph nodes throughout the  left and right pelvis are similar to the previous exam. 4. Calcified coronary artery disease. 5. Low-density lesions in the liver are unchanged. 6. Persistent endometrial thickening not well assessed on CT. 7. Stranding in the right inguinal region tracking towards the abdominal wall likely related to prior inguinal hernia repair is unchanged. Area of discrete nodularity in this location is no longer seen.   Aortic Atherosclerosis  (ICD10-I70.0).       09/07/2019 Tumor Marker   Patient's tumor was tested for the following markers: CA-125 Results of the tumor marker test revealed 214   11/08/2019 Imaging   1. Multiple mild-to-moderate severity thickened small bowel loops throughout the abdomen with associated peri intestinal inflammatory fat stranding. While this may represent an infectious or inflammatory enteritis, the presence of an underlying neoplastic process cannot be excluded. 2. Large amount of perihepatic fluid. 3. Stable bilateral adnexal masses, likely ovarian in origin. Correlation with pelvic ultrasound is recommended. 4. Small foci of parenchymal low attenuation within the right lobe of the liver which may represent small cysts or hemangiomas. 5. Stable left renal cyst. 6. Aortic atherosclerosis.   Aortic Atherosclerosis (ICD10-I70.0).     11/16/2019 Tumor Marker   Patient's tumor was tested for the following markers: CA-125 Results of the tumor marker test revealed 456   12/07/2019 Tumor Marker   Patient's tumor was tested for the following markers: CA-125. Results of the tumor marker test revealed 676   01/17/2020 Tumor Marker   Patient's tumor was tested for the following markers: CA-125 Results of the tumor marker test revealed 1333   01/26/2020 Imaging   Increased peritoneal carcinomatosis, mild ascites, and tiny right pleural effusion. Increased size of bilateral adnexal masses, consistent with metastatic disease.   New soft tissue density involving the right anterior cervix and right bladder base, consistent with metastatic disease.   Mildly increased abdominal and pelvic lymphadenopathy, consistent with metastatic disease.   Aortic Atherosclerosis (ICD10-I70.0).     02/15/2020 -  Chemotherapy   The patient had carboplatin for chemotherapy treatment.     02/15/2020 Tumor Marker   Patient's tumor was tested for the following markers: CA-125 Results of the tumor marker test revealed  1784   03/07/2020 Tumor Marker   Patient's tumor was tested for the following markers: CA-125 Results of the tumor marker test revealed 1037     REVIEW OF SYSTEMS:   Constitutional: Denies fevers, chills or abnormal weight loss Eyes: Denies blurriness of vision Ears, nose, mouth, throat, and face: Denies mucositis or sore throat Respiratory: Denies cough, dyspnea or wheezes Cardiovascular: Denies palpitation, chest discomfort or lower extremity swelling Gastrointestinal:  Denies nausea, heartburn or change in bowel habits Skin: Denies abnormal skin rashes Lymphatics: Denies new lymphadenopathy or easy bruising Neurological:Denies numbness, tingling or new weaknesses Behavioral/Psych: Mood is stable, no new changes  All other systems were reviewed with the patient and are negative.  I have reviewed the past medical history, past surgical history, social history and family history with the patient and they are unchanged from previous note.  ALLERGIES:  is allergic to zofran [ondansetron hcl].  MEDICATIONS:  Current Outpatient Medications  Medication Sig Dispense Refill  . acetaminophen (TYLENOL) 500 MG tablet Take 1 tablet (500 mg total) by mouth every 6 (six) hours as needed. (Patient taking differently: Take 1,000 mg by mouth every 6 (six) hours as needed for moderate pain. ) 30 tablet 3  . Calcium Carbonate (CALTRATE 600 PO) Take 1 tablet by mouth 2 (two) times daily.     Marland Kitchen  enalapril (VASOTEC) 20 MG tablet Take 1 tablet (20 mg total) by mouth daily. 90 tablet 1  . Fluticasone-Salmeterol (ADVAIR DISKUS) 100-50 MCG/DOSE AEPB USE 1 INHALATION BY MOUTH TWICE DAILY 60 each 2  . furosemide (LASIX) 40 MG tablet Take 1 tablet (40 mg total) by mouth daily as needed (leg swelling). 90 tablet 1  . levothyroxine (SYNTHROID) 125 MCG tablet Take 1 tablet (125 mcg total) by mouth daily before breakfast. 30 tablet 11  . lidocaine-prilocaine (EMLA) cream Apply to affected area once (Patient taking  differently: Apply 1 application topically daily as needed. Apply to affected area once) 30 g 3  . Multiple Vitamin (MULTIVITAMIN) tablet Take 1 tablet by mouth daily.    . Multiple Vitamins-Minerals (ICAPS AREDS 2 PO) Take 1 capsule by mouth 2 (two) times daily.    . ondansetron (ZOFRAN) 8 MG tablet Take 1 tablet (8 mg total) by mouth every 8 (eight) hours as needed. Start on the third day after chemotherapy. 30 tablet 1  . oxyCODONE (OXY IR/ROXICODONE) 5 MG immediate release tablet Take 1 tablet (5 mg total) by mouth every 6 (six) hours as needed for severe pain. 60 tablet 0  . polyethylene glycol (MIRALAX / GLYCOLAX) 17 g packet Take 17 g by mouth daily as needed for moderate constipation.    . prochlorperazine (COMPAZINE) 10 MG tablet Take 1 tablet (10 mg total) by mouth every 6 (six) hours as needed (Nausea or vomiting). 60 tablet 1  . rivaroxaban (XARELTO) 20 MG TABS tablet Take 1 tablet (20 mg total) by mouth daily with supper. 90 tablet 3  . rosuvastatin (CRESTOR) 10 MG tablet Take 1 tablet (10 mg total) by mouth daily. Start with 68m for the first week. 90 tablet 0   No current facility-administered medications for this visit.    PHYSICAL EXAMINATION: ECOG PERFORMANCE STATUS: 2 - Symptomatic, <50% confined to bed  Vitals:   04/16/20 1057  BP: 131/64  Pulse: 93  Resp: 17  Temp: 97.7 F (36.5 C)  SpO2: 98%   Filed Weights   04/16/20 1057  Weight: 210 lb 9.6 oz (95.5 kg)    GENERAL:alert, no distress and comfortable.  She is somewhat frail SKIN: skin color, texture, turgor are normal, no rashes or significant lesions EYES: normal, Conjunctiva are pink and non-injected, sclera clear OROPHARYNX:no exudate, no erythema and lips, buccal mucosa, and tongue normal  NECK: supple, thyroid normal size, non-tender, without nodularity LYMPH:  no palpable lymphadenopathy in the cervical, axillary or inguinal LUNGS: clear to auscultation and percussion with normal breathing  effort HEART: regular rate & rhythm and no murmurs with mild bilateral lower extremity edema ABDOMEN:abdomen soft, non-tender and normal bowel sounds Musculoskeletal:no cyanosis of digits and no clubbing  NEURO: alert & oriented x 3 with fluent speech, no focal motor/sensory deficits  LABORATORY DATA:  I have reviewed the data as listed    Component Value Date/Time   NA 137 03/28/2020 1153   NA 142 12/25/2014 1122   K 4.5 03/28/2020 1153   K 4.1 12/25/2014 1122   CL 105 03/28/2020 1153   CL 102 10/04/2012 1159   CO2 28 03/28/2020 1153   CO2 29 12/25/2014 1122   GLUCOSE 89 03/28/2020 1153   GLUCOSE 99 12/25/2014 1122   GLUCOSE 116 (H) 10/04/2012 1159   BUN 14 03/28/2020 1153   BUN 13.7 12/25/2014 1122   CREATININE 0.86 03/28/2020 1153   CREATININE 0.96 (H) 02/27/2016 1530   CREATININE 0.9 12/25/2014 1122   CALCIUM  9.9 03/28/2020 1153   CALCIUM 10.1 12/25/2014 1122   PROT 7.0 03/28/2020 1153   PROT 7.8 12/25/2014 1122   ALBUMIN 3.2 (L) 03/28/2020 1153   ALBUMIN 3.4 (L) 12/25/2014 1122   AST 23 03/28/2020 1153   AST 21 12/25/2014 1122   ALT 8 03/28/2020 1153   ALT 17 12/25/2014 1122   ALKPHOS 71 03/28/2020 1153   ALKPHOS 99 12/25/2014 1122   BILITOT 0.2 (L) 03/28/2020 1153   BILITOT 0.41 12/25/2014 1122   GFRNONAA >60 03/28/2020 1153   GFRNONAA 58 (L) 02/27/2016 1530   GFRAA >60 02/15/2020 0940   GFRAA 66 02/27/2016 1530    No results found for: SPEP, UPEP  Lab Results  Component Value Date   WBC 7.5 04/16/2020   NEUTROABS 5.3 04/16/2020   HGB 8.5 (L) 04/16/2020   HCT 25.7 (L) 04/16/2020   MCV 101.6 (H) 04/16/2020   PLT 187 04/16/2020      Chemistry      Component Value Date/Time   NA 137 03/28/2020 1153   NA 142 12/25/2014 1122   K 4.5 03/28/2020 1153   K 4.1 12/25/2014 1122   CL 105 03/28/2020 1153   CL 102 10/04/2012 1159   CO2 28 03/28/2020 1153   CO2 29 12/25/2014 1122   BUN 14 03/28/2020 1153   BUN 13.7 12/25/2014 1122   CREATININE 0.86  03/28/2020 1153   CREATININE 0.96 (H) 02/27/2016 1530   CREATININE 0.9 12/25/2014 1122      Component Value Date/Time   CALCIUM 9.9 03/28/2020 1153   CALCIUM 10.1 12/25/2014 1122   ALKPHOS 71 03/28/2020 1153   ALKPHOS 99 12/25/2014 1122   AST 23 03/28/2020 1153   AST 21 12/25/2014 1122   ALT 8 03/28/2020 1153   ALT 17 12/25/2014 1122   BILITOT 0.2 (L) 03/28/2020 1153   BILITOT 0.41 12/25/2014 1122

## 2020-04-16 NOTE — Assessment & Plan Note (Signed)
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

## 2020-04-16 NOTE — Telephone Encounter (Signed)
Scheduled appts per 11/15 sch msg. Gave pt a print out of AVS.

## 2020-04-16 NOTE — Patient Instructions (Signed)
[facility] Discharge Instructions for Patients Receiving Chemotherapy  Today you received the following chemotherapy agents: carboplatin.  To help prevent nausea and vomiting after your treatment, we encourage you to take your nausea medication as directed.   If you develop nausea and vomiting that is not controlled by your nausea medication, call the clinic.   BELOW ARE SYMPTOMS THAT SHOULD BE REPORTED IMMEDIATELY:  *FEVER GREATER THAN 100.5 F  *CHILLS WITH OR WITHOUT FEVER  NAUSEA AND VOMITING THAT IS NOT CONTROLLED WITH YOUR NAUSEA MEDICATION  *UNUSUAL SHORTNESS OF BREATH  *UNUSUAL BRUISING OR BLEEDING  TENDERNESS IN MOUTH AND THROAT WITH OR WITHOUT PRESENCE OF ULCERS  *URINARY PROBLEMS  *BOWEL PROBLEMS  UNUSUAL RASH Items with * indicate a potential emergency and should be followed up as soon as possible.  Feel free to call the clinic should you have any questions or concerns. The clinic phone number is [phone].  Please show the CHEMO ALERT CARD at check-in to the Emergency Department and triage nurse.   

## 2020-04-17 LAB — CA 125: Cancer Antigen (CA) 125: 718 U/mL — ABNORMAL HIGH (ref 0.0–38.1)

## 2020-04-27 ENCOUNTER — Other Ambulatory Visit: Payer: Medicare Other

## 2020-04-27 ENCOUNTER — Ambulatory Visit (HOSPITAL_COMMUNITY)
Admission: RE | Admit: 2020-04-27 | Discharge: 2020-04-27 | Disposition: A | Discharge status: Home / Self Care | Payer: Medicare Other | Source: Ambulatory Visit | Attending: Hematology and Oncology | Admitting: Hematology and Oncology

## 2020-04-27 ENCOUNTER — Inpatient Hospital Stay: Payer: Medicare Other

## 2020-04-27 ENCOUNTER — Encounter (HOSPITAL_COMMUNITY): Payer: Self-pay

## 2020-04-27 ENCOUNTER — Other Ambulatory Visit: Payer: Self-pay

## 2020-04-27 DIAGNOSIS — R971 Elevated cancer antigen 125 [CA 125]: Secondary | ICD-10-CM | POA: Diagnosis not present

## 2020-04-27 DIAGNOSIS — R946 Abnormal results of thyroid function studies: Secondary | ICD-10-CM | POA: Diagnosis not present

## 2020-04-27 DIAGNOSIS — K7689 Other specified diseases of liver: Secondary | ICD-10-CM | POA: Diagnosis not present

## 2020-04-27 DIAGNOSIS — C55 Malignant neoplasm of uterus, part unspecified: Secondary | ICD-10-CM

## 2020-04-27 DIAGNOSIS — E538 Deficiency of other specified B group vitamins: Secondary | ICD-10-CM | POA: Diagnosis not present

## 2020-04-27 DIAGNOSIS — C787 Secondary malignant neoplasm of liver and intrahepatic bile duct: Secondary | ICD-10-CM | POA: Diagnosis not present

## 2020-04-27 DIAGNOSIS — N281 Cyst of kidney, acquired: Secondary | ICD-10-CM | POA: Diagnosis not present

## 2020-04-27 DIAGNOSIS — Z853 Personal history of malignant neoplasm of breast: Secondary | ICD-10-CM | POA: Diagnosis not present

## 2020-04-27 DIAGNOSIS — D539 Nutritional anemia, unspecified: Secondary | ICD-10-CM

## 2020-04-27 DIAGNOSIS — Z79899 Other long term (current) drug therapy: Secondary | ICD-10-CM | POA: Diagnosis not present

## 2020-04-27 DIAGNOSIS — Z5111 Encounter for antineoplastic chemotherapy: Secondary | ICD-10-CM | POA: Diagnosis not present

## 2020-04-27 DIAGNOSIS — C7800 Secondary malignant neoplasm of unspecified lung: Secondary | ICD-10-CM | POA: Diagnosis not present

## 2020-04-27 DIAGNOSIS — C786 Secondary malignant neoplasm of retroperitoneum and peritoneum: Secondary | ICD-10-CM | POA: Diagnosis not present

## 2020-04-27 DIAGNOSIS — N183 Chronic kidney disease, stage 3 unspecified: Secondary | ICD-10-CM | POA: Diagnosis not present

## 2020-04-27 DIAGNOSIS — D6481 Anemia due to antineoplastic chemotherapy: Secondary | ICD-10-CM | POA: Diagnosis not present

## 2020-04-27 DIAGNOSIS — R590 Localized enlarged lymph nodes: Secondary | ICD-10-CM | POA: Diagnosis not present

## 2020-04-27 DIAGNOSIS — C772 Secondary and unspecified malignant neoplasm of intra-abdominal lymph nodes: Secondary | ICD-10-CM | POA: Diagnosis not present

## 2020-04-27 DIAGNOSIS — I700 Atherosclerosis of aorta: Secondary | ICD-10-CM | POA: Diagnosis not present

## 2020-04-27 DIAGNOSIS — Z923 Personal history of irradiation: Secondary | ICD-10-CM | POA: Diagnosis not present

## 2020-04-27 DIAGNOSIS — J90 Pleural effusion, not elsewhere classified: Secondary | ICD-10-CM | POA: Diagnosis not present

## 2020-04-27 DIAGNOSIS — Z7901 Long term (current) use of anticoagulants: Secondary | ICD-10-CM | POA: Diagnosis not present

## 2020-04-27 LAB — CBC WITH DIFFERENTIAL (CANCER CENTER ONLY)
Abs Immature Granulocytes: 0.04 10*3/uL (ref 0.00–0.07)
Basophils Absolute: 0 10*3/uL (ref 0.0–0.1)
Basophils Relative: 1 %
Eosinophils Absolute: 0.4 10*3/uL (ref 0.0–0.5)
Eosinophils Relative: 6 %
HCT: 24.8 % — ABNORMAL LOW (ref 36.0–46.0)
Hemoglobin: 8.2 g/dL — ABNORMAL LOW (ref 12.0–15.0)
Immature Granulocytes: 1 %
Lymphocytes Relative: 15 %
Lymphs Abs: 1 10*3/uL (ref 0.7–4.0)
MCH: 34.3 pg — ABNORMAL HIGH (ref 26.0–34.0)
MCHC: 33.1 g/dL (ref 30.0–36.0)
MCV: 103.8 fL — ABNORMAL HIGH (ref 80.0–100.0)
Monocytes Absolute: 0.9 10*3/uL (ref 0.1–1.0)
Monocytes Relative: 13 %
Neutro Abs: 4.2 10*3/uL (ref 1.7–7.7)
Neutrophils Relative %: 64 %
Platelet Count: 251 10*3/uL (ref 150–400)
RBC: 2.39 MIL/uL — ABNORMAL LOW (ref 3.87–5.11)
RDW: 16.9 % — ABNORMAL HIGH (ref 11.5–15.5)
WBC Count: 6.5 10*3/uL (ref 4.0–10.5)
nRBC: 0 % (ref 0.0–0.2)

## 2020-04-27 LAB — CMP (CANCER CENTER ONLY)
ALT: 7 U/L (ref 0–44)
AST: 21 U/L (ref 15–41)
Albumin: 3.3 g/dL — ABNORMAL LOW (ref 3.5–5.0)
Alkaline Phosphatase: 70 U/L (ref 38–126)
Anion gap: 8 (ref 5–15)
BUN: 28 mg/dL — ABNORMAL HIGH (ref 8–23)
CO2: 24 mmol/L (ref 22–32)
Calcium: 9.9 mg/dL (ref 8.9–10.3)
Chloride: 109 mmol/L (ref 98–111)
Creatinine: 1.2 mg/dL — ABNORMAL HIGH (ref 0.44–1.00)
GFR, Estimated: 46 mL/min — ABNORMAL LOW (ref >60)
Glucose, Bld: 90 mg/dL (ref 70–99)
Potassium: 4.5 mmol/L (ref 3.5–5.1)
Sodium: 141 mmol/L (ref 135–145)
Total Bilirubin: 0.3 mg/dL (ref 0.3–1.2)
Total Protein: 7.3 g/dL (ref 6.5–8.1)

## 2020-04-27 LAB — SAMPLE TO BLOOD BANK

## 2020-04-27 MED ORDER — IOHEXOL 300 MG/ML  SOLN
100.0000 mL | Freq: Once | INTRAMUSCULAR | Status: AC | PRN
  Administered 2020-04-27: 100 mL via INTRAVENOUS

## 2020-04-27 MED ORDER — HEPARIN SOD (PORK) LOCK FLUSH 100 UNIT/ML IV SOLN: 500.0000 [IU] | Freq: Once | INTRAVENOUS | Status: AC

## 2020-04-27 MED ORDER — HEPARIN SOD (PORK) LOCK FLUSH 100 UNIT/ML IV SOLN
INTRAVENOUS | Status: AC
  Administered 2020-04-27: 500 [IU] via INTRAVENOUS
  Filled 2020-04-27: qty 5

## 2020-04-27 MED ORDER — SODIUM CHLORIDE 0.9% FLUSH
10.0000 mL | Freq: Once | INTRAVENOUS | Status: AC
  Administered 2020-04-27: 10 mL
  Filled 2020-04-27: qty 10

## 2020-04-30 ENCOUNTER — Inpatient Hospital Stay (HOSPITAL_BASED_OUTPATIENT_CLINIC_OR_DEPARTMENT_OTHER): Payer: Medicare Other | Admitting: Hematology and Oncology

## 2020-04-30 ENCOUNTER — Telehealth: Payer: Self-pay | Admitting: Hematology and Oncology

## 2020-04-30 ENCOUNTER — Other Ambulatory Visit: Payer: Self-pay

## 2020-04-30 DIAGNOSIS — D6481 Anemia due to antineoplastic chemotherapy: Secondary | ICD-10-CM

## 2020-04-30 DIAGNOSIS — N183 Chronic kidney disease, stage 3 unspecified: Secondary | ICD-10-CM

## 2020-04-30 DIAGNOSIS — Z79899 Other long term (current) drug therapy: Secondary | ICD-10-CM | POA: Diagnosis not present

## 2020-04-30 DIAGNOSIS — R590 Localized enlarged lymph nodes: Secondary | ICD-10-CM | POA: Diagnosis not present

## 2020-04-30 DIAGNOSIS — Z7901 Long term (current) use of anticoagulants: Secondary | ICD-10-CM | POA: Diagnosis not present

## 2020-04-30 DIAGNOSIS — C787 Secondary malignant neoplasm of liver and intrahepatic bile duct: Secondary | ICD-10-CM | POA: Diagnosis not present

## 2020-04-30 DIAGNOSIS — R946 Abnormal results of thyroid function studies: Secondary | ICD-10-CM

## 2020-04-30 DIAGNOSIS — J90 Pleural effusion, not elsewhere classified: Secondary | ICD-10-CM | POA: Diagnosis not present

## 2020-04-30 DIAGNOSIS — C7800 Secondary malignant neoplasm of unspecified lung: Secondary | ICD-10-CM | POA: Diagnosis not present

## 2020-04-30 DIAGNOSIS — Z5111 Encounter for antineoplastic chemotherapy: Secondary | ICD-10-CM | POA: Diagnosis not present

## 2020-04-30 DIAGNOSIS — E538 Deficiency of other specified B group vitamins: Secondary | ICD-10-CM | POA: Diagnosis not present

## 2020-04-30 DIAGNOSIS — Z853 Personal history of malignant neoplasm of breast: Secondary | ICD-10-CM | POA: Diagnosis not present

## 2020-04-30 DIAGNOSIS — Z923 Personal history of irradiation: Secondary | ICD-10-CM | POA: Diagnosis not present

## 2020-04-30 DIAGNOSIS — R971 Elevated cancer antigen 125 [CA 125]: Secondary | ICD-10-CM | POA: Diagnosis not present

## 2020-04-30 DIAGNOSIS — I700 Atherosclerosis of aorta: Secondary | ICD-10-CM | POA: Diagnosis not present

## 2020-04-30 DIAGNOSIS — C55 Malignant neoplasm of uterus, part unspecified: Secondary | ICD-10-CM

## 2020-04-30 DIAGNOSIS — Z7189 Other specified counseling: Secondary | ICD-10-CM

## 2020-04-30 DIAGNOSIS — C786 Secondary malignant neoplasm of retroperitoneum and peritoneum: Secondary | ICD-10-CM | POA: Diagnosis not present

## 2020-04-30 DIAGNOSIS — T451X5A Adverse effect of antineoplastic and immunosuppressive drugs, initial encounter: Secondary | ICD-10-CM

## 2020-04-30 NOTE — Telephone Encounter (Signed)
Scheduled appts per 11/29 sch msg. Gave pt a print out of AVS.

## 2020-05-01 NOTE — Assessment & Plan Note (Signed)
Her serum creatinine is satisfactory We discussed the importance of adequate hydration and risk factor modification I will adjust the dose of her chemotherapy accordingly

## 2020-05-01 NOTE — Assessment & Plan Note (Signed)
She has multifactorial anemia, anemia chronic kidney disease, 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 and vitamin B12 deficiency I will bring her back in between chemo for blood count check and she will get 1 unit of blood if hemoglobin is less than 8 She will get vitamin B12 injection with each chemo appointment

## 2020-05-01 NOTE — Assessment & Plan Note (Signed)
We have extensive goals of care discussion She is in agreement with the plan of care to continue a few more cycles of therapy

## 2020-05-01 NOTE — Progress Notes (Signed)
[place] OFFICE PROGRESS NOTE  Patient Care Team: [name], DO as PCP - General (Family Medicine) [name], MD as Consulting Physician (Ophthalmology) [name], DPM as Consulting Physician (Podiatry) [name], [name], MD as Referring Physician (Optometry) [name], MD (Inactive) as Consulting Physician (Orthopedic Surgery) [name], RN as Oncology Nurse Navigator (Oncology)  ASSESSMENT & PLAN:  Uterine cancer [facility]) I have reviewed her CT imaging extensively Overall, she have stability except for some pelvic lymph node and changes near her bladder She is completely asymptomatic She has near complete resolution of ascites and her tumor marker has dropped to half We discussed the risk and benefits of continuing treatment and she is in agreement She will return next month for her next cycle of treatment and she will get interim blood count monitoring and transfusion as needed if her hemoglobin drops to less than 8  Anemia due to antineoplastic chemotherapy She has multifactorial anemia, anemia chronic kidney disease, 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 and vitamin B12 deficiency I will bring her back in between chemo for blood count check and she will get 1 unit of blood if hemoglobin is less than 8 She will get vitamin B12 injection with each chemo appointment  Abnormal thyroid function test 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   All questions were answered. The patient knows to call the clinic with any problems, questions or concerns. The total time spent in the appointment was 40 minutes encounter with patients including review of chart and various tests results, discussions about plan of care and coordination of care plan   [name], MD 05/01/2020 9:35 AM  INTERVAL HISTORY: Please see below for problem oriented charting. 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 Ultrasound irregular hypoechoic mass 7 x 6 x 5 mm   01/06/2012 Initial Biopsy   Initial biopsy was benign . 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 Cervix, biopsy - HIGH GRADE CARCINOMA. - SEE COMMENT. 2. Endocervix, curettage - HIGH GRADE CARCINOMA. - SEE COMMENT. 3.  Endometrium, biopsy - HIGH GRADE CARCINOMA. - SEE COMMENT. Microscopic Comment 1. - 3. The carcinoma in the three specimens is morphologically similar and has features suggestive of papillary serous carcinoma.   07/26/2018 Tumor Marker   Patient's tumor was tested for the following markers: CA-125 Results of the tumor marker test revealed 835   07/28/2018 Imaging   1. Several (at least 10) solid pulmonary nodules scattered throughout both lungs are new since 2012 chest CT, likely representing pulmonary metastases, largest 6 mm, below PET resolution. 2. Redemonstration of hypodense 2.5 cm segment 7 right liver lobe mass, indeterminate, suspicious for liver metastasis. 3. 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 4. Three-vessel coronary atherosclerosis.  Aortic Atherosclerosis (ICD10-I70.0).   08/03/2018 Cancer Staging   Staging form: Corpus Uteri - Carcinoma and Carcinosarcoma, AJCC 8th Edition - Clinical: FIGO Stage IVB (cT3b, cN2, cM1) - Signed by [name], MD on 08/03/2018   08/05/2018 Tumor Marker   Patient's tumor was tested for the following markers: CA-125 Results of the tumor marker test revealed 901   08/06/2018 Procedure   Placement of single lumen port a cath via right internal jugular vein. The catheter tip lies at the cavo-atrial junction. A power injectable port a cath was placed and is ready for immediate use.    08/11/2018 - 01/12/2019 Chemotherapy   The patient had carboplatin and taxol x 8 cycles   09/01/2018 Tumor Marker   Patient's tumor was tested for the following markers: CA-125 Results of the tumor marker test revealed 406   09/09/2018 Genetic Testing   The Common Hereditary Cancers Panel offered by [facility] includes sequencing and/or deletion duplication testing of the following 48 genes: APC, ATM, AXIN2, BARD1, BMPR1A, BRCA1, BRCA2, BRIP1, CDH1, CDKN2A (p14ARF), CDKN2A (p16INK4a), CKD4, CHEK2, CTNNA1, DICER1, EPCAM (Deletion/duplication  testing only), GREM1 (promoter region deletion/duplication testing only), KIT, MEN1, MLH1, MSH2, MSH3, MSH6, MUTYH, NBN, NF1, NHTL1, PALB2, PDGFRA, PMS2, POLD1, POLE, PTEN, RAD50, RAD51C, RAD51D, RNF43, SDHB, SDHC, SDHD, SMAD4, SMARCA4. STK11, TP53, TSC1, TSC2, and VHL.  The following genes were evaluated for sequence changes only: SDHA and HOXB13 c.251G>A variant only.  Results: Negative, no pathogenic variants identified. The report date is 09/09/2018.     09/22/2018 Tumor Marker   Patient's tumor was tested for the following markers: CA-125 Results of the tumor marker test revealed 110   10/12/2018 Tumor Marker   Patient's tumor was tested for the following markers: CA-125 Results of the tumor marker test revealed 55.3   10/12/2018 Imaging   1. Mild decrease in pulmonary metastases, liver metastases, and metastatic abdominal and pelvic lymphadenopathy. 2. Decreased size of complex cystic lesion in left presacral region. 3. No new or progressive metastatic disease identified. 4. Stable small right inguinal hernia containing small portion of urinary bladder.   11/03/2018 Tumor Marker   Patient's tumor was tested for the following markers: CA-125 Results of the tumor marker test revealed 34.3    Genetic Testing   Patient has genetic testing done for HER2. Results revealed patient has the following: HER2 - negative from pathology from 07/26/18.   12/20/2018 Imaging   1. Possible new omental nodule. 2. Improving abdominal and pelvic retroperitoneal adenopathy. 3. Stable  pulmonary and hepatic metastatic disease. 4. Left perirectal/presacral fluid collection has decreased in size. 5. Right inguinal hernia contains a small portion of the bladder, as before. 6. Aortic atherosclerosis (ICD10-170.0). Coronary artery calcification   12/21/2018 Tumor Marker   Patient's tumor was tested for the following markers: CA-125 Results of the tumor marker test revealed 15.6   01/15/2019 Imaging   Ct  abdomen and pelvis 1. Small hiatal hernia with fluid in the distal esophagus, which may represent reflux or esophagitis. No other acute findings. 2. Right inguinal hernia contains anterolateral aspect of the urinary bladder, unchanged in appearance from prior. Mild edema within the hernia sac is similar. 3. Unchanged small low-density lesions in the liver. 4. Unchanged retroperitoneal and bilateral iliac lymph nodes. Small ventral abdominal omental nodule tentatively identified and unchanged from prior.   Aortic Atherosclerosis (ICD10-I70.0).   02/02/2019 Tumor Marker   Patient's tumor was tested for the following markers: CA-125 Results of the tumor marker test revealed 7.8   03/22/2019 Surgery   Preoperative diagnosis: History of incarcerated right inguinal hernia   Postop diagnosis: Incarcerated right inguinal hernia with preperitoneal fat without strangulation or obstruction   Procedure: Repair of right inguinal hernia with ultra Pro hernia system mesh   Surgeon: Erroll Luna, MD   03/31/2019 Tumor Marker   Patient's tumor was tested for the following markers: CA-`125 Results of the tumor marker test revealed 24.4.   05/25/2019 Tumor Marker   Patient's tumor was tested for the following markers: CA-125 Results of the tumor marker test revealed 287   05/25/2019 Imaging   New solid bilateral adnexal masses, consistent with metastatic disease.   New peritoneal soft tissue nodules in the anterior right pelvis, suspicious for peritoneal carcinomatosis. No evidence of ascites.   No significant change in shotty sub-cm abdominal retroperitoneal and bilateral iliac lymph nodes.   06/09/2019 - 01/17/2020 Chemotherapy   The patient had pembrolizumab for chemotherapy treatment.     06/09/2019 Tumor Marker   Patient's tumor was tested for the following markers: CA-125 Results of the tumor marker test revealed 468   07/26/2019 Tumor Marker   Patient's tumor was tested for the following  markers: CA-125 Results of the tumor marker test revealed 282   08/17/2019 Tumor Marker   Patient's tumor was tested for the following markers: CA-125 Results of the tumor marker test revealed 190.   09/05/2019 Imaging   1. Subtle nodularity along the transverse mesocolon measuring 11 mm is concerning for peritoneal carcinomatosis. Also associated with perihepatic ascites and enlarging juxta diaphragmatic nodal disease.  Attention on follow-up. 2. Bilateral adnexal masses are similar in size to the previous exam. 3. Left para-aortic lymph node is slightly increased in size from the previous exam, slight interval enlargement is suspicious based on location for nodal involvement. Scattered subcentimeter lymph nodes throughout the left and right pelvis are similar to the previous exam. 4. Calcified coronary artery disease. 5. Low-density lesions in the liver are unchanged. 6. Persistent endometrial thickening not well assessed on CT. 7. Stranding in the right inguinal region tracking towards the abdominal wall likely related to prior inguinal hernia repair is unchanged. Area of discrete nodularity in this location is no longer seen.   Aortic Atherosclerosis (ICD10-I70.0).       09/07/2019 Tumor Marker   Patient's tumor was tested for the following markers: CA-125 Results of the tumor marker test revealed 214   11/08/2019 Imaging   1. Multiple mild-to-moderate severity thickened small bowel loops throughout the abdomen with  associated peri intestinal inflammatory fat stranding. While this may represent an infectious or inflammatory enteritis, the presence of an underlying neoplastic process cannot be excluded. 2. Large amount of perihepatic fluid. 3. Stable bilateral adnexal masses, likely ovarian in origin. Correlation with pelvic ultrasound is recommended. 4. Small foci of parenchymal low attenuation within the right lobe of the liver which may represent small cysts or hemangiomas. 5. Stable left  renal cyst. 6. Aortic atherosclerosis.   Aortic Atherosclerosis (ICD10-I70.0).     11/16/2019 Tumor Marker   Patient's tumor was tested for the following markers: CA-125 Results of the tumor marker test revealed 456   12/07/2019 Tumor Marker   Patient's tumor was tested for the following markers: CA-125. Results of the tumor marker test revealed 676   01/17/2020 Tumor Marker   Patient's tumor was tested for the following markers: CA-125 Results of the tumor marker test revealed 1333   01/26/2020 Imaging   Increased peritoneal carcinomatosis, mild ascites, and tiny right pleural effusion. Increased size of bilateral adnexal masses, consistent with metastatic disease.   New soft tissue density involving the right anterior cervix and right bladder base, consistent with metastatic disease.   Mildly increased abdominal and pelvic lymphadenopathy, consistent with metastatic disease.   Aortic Atherosclerosis (ICD10-I70.0).     02/15/2020 -  Chemotherapy   The patient had carboplatin for chemotherapy treatment.     02/15/2020 Tumor Marker   Patient's tumor was tested for the following markers: CA-125 Results of the tumor marker test revealed 1784   03/07/2020 Tumor Marker   Patient's tumor was tested for the following markers: CA-125 Results of the tumor marker test revealed 1037   04/16/2020 Tumor Marker   Patient's tumor was tested for the following markers: CA-125 Results of the tumor marker test revealed 718   04/27/2020 Imaging   1. Overall, no significant change in the appearance of peritoneal carcinomatosis. 2. Minimal change in size of large bilateral adnexal masses. 3. Mild progression of abdominal, pelvic and inguinal adenopathy. 4. Tumor arising from the right anterior cervix is again noted and appears increased in size with signs of tumor invasion into the right side of the urinary bladder. There has also been increase in tumor arising from the left posterior cervix. 5.  New subpleural nodularity along the right hemidiaphragm. 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 No pleural effusion noted at this time. 6. No significant interval change in the appearance of small, nonspecific, multifocal pulmonary nodules 7. Coronary artery calcifications noted. 8. Aortic atherosclerosis.     REVIEW OF SYSTEMS:   Constitutional: Denies fevers, chills or abnormal weight loss Eyes: Denies blurriness of vision Ears, nose, mouth, throat, and face: Denies mucositis or sore throat Respiratory: Denies cough, dyspnea or wheezes Cardiovascular: Denies palpitation, chest discomfort or lower extremity swelling Gastrointestinal:  Denies nausea, heartburn or change in bowel habits Skin: Denies abnormal skin rashes Lymphatics: Denies new lymphadenopathy or easy bruising Neurological:Denies numbness, tingling or new weaknesses Behavioral/Psych: Mood is stable, no new changes  All other systems were reviewed with the patient and are negative.  I have reviewed the past medical history, past surgical history, social history and family history with the patient and they are unchanged from previous note.  ALLERGIES:  is allergic to zofran [ondansetron hcl].  MEDICATIONS:  Current Outpatient Medications  Medication Sig Dispense Refill  . acetaminophen (TYLENOL) 500 MG tablet Take 1 tablet (500 mg total) by mouth every 6 (six) hours as needed. (Patient taking differently: Take  1,000 mg by mouth every 6 (six) hours as needed for moderate pain. ) 30 tablet 3  . Calcium Carbonate (CALTRATE 600 PO) Take 1 tablet by mouth 2 (two) times daily.     . enalapril (VASOTEC) 20 MG tablet Take 1 tablet (20 mg total) by mouth daily. 90 tablet 1  . Fluticasone-Salmeterol (ADVAIR DISKUS) 100-50 MCG/DOSE AEPB USE 1 INHALATION BY MOUTH TWICE DAILY 60 each 2  . furosemide (LASIX) 40 MG tablet Take 1 tablet (40 mg total) by mouth daily as needed (leg swelling). 90 tablet  1  . levothyroxine (SYNTHROID) 125 MCG tablet Take 1 tablet (125 mcg total) by mouth daily before breakfast. 30 tablet 11  . lidocaine-prilocaine (EMLA) cream Apply to affected area once (Patient taking differently: Apply 1 application topically daily as needed. Apply to affected area once) 30 g 3  . Multiple Vitamin (MULTIVITAMIN) tablet Take 1 tablet by mouth daily.    . Multiple Vitamins-Minerals (ICAPS AREDS 2 PO) Take 1 capsule by mouth 2 (two) times daily.    . ondansetron (ZOFRAN) 8 MG tablet Take 1 tablet (8 mg total) by mouth every 8 (eight) hours as needed. Start on the third day after chemotherapy. 30 tablet 1  . oxyCODONE (OXY IR/ROXICODONE) 5 MG immediate release tablet Take 1 tablet (5 mg total) by mouth every 6 (six) hours as needed for severe pain. 60 tablet 0  . polyethylene glycol (MIRALAX / GLYCOLAX) 17 g packet Take 17 g by mouth daily as needed for moderate constipation.    . prochlorperazine (COMPAZINE) 10 MG tablet Take 1 tablet (10 mg total) by mouth every 6 (six) hours as needed (Nausea or vomiting). 60 tablet 1  . rivaroxaban (XARELTO) 20 MG TABS tablet Take 1 tablet (20 mg total) by mouth daily with supper. 90 tablet 3  . rosuvastatin (CRESTOR) 10 MG tablet Take 1 tablet (10 mg total) by mouth daily. Start with 45m for the first week. 90 tablet 0   No current facility-administered medications for this visit.    PHYSICAL EXAMINATION: ECOG PERFORMANCE STATUS: 2 - Symptomatic, <50% confined to bed  Vitals:   04/30/20 1231  BP: (!) 158/64  Pulse: (!) 104  Resp: 20  Temp: (!) 96.8 F (36 C)  SpO2: 100%   Filed Weights   04/30/20 1231  Weight: 211 lb 6.4 oz (95.9 kg)    GENERAL:alert, no distress and comfortable NEURO: alert & oriented x 3 with fluent speech, no focal motor/sensory deficits  LABORATORY DATA:  I have reviewed the data as listed    Component Value Date/Time   NA 141 04/27/2020 1148   NA 142 12/25/2014 1122   K 4.5 04/27/2020 1148   K 4.1  12/25/2014 1122   CL 109 04/27/2020 1148   CL 102 10/04/2012 1159   CO2 24 04/27/2020 1148   CO2 29 12/25/2014 1122   GLUCOSE 90 04/27/2020 1148   GLUCOSE 99 12/25/2014 1122   GLUCOSE 116 (H) 10/04/2012 1159   BUN 28 (H) 04/27/2020 1148   BUN 13.7 12/25/2014 1122   CREATININE 1.20 (H) 04/27/2020 1148   CREATININE 0.96 (H) 02/27/2016 1530   CREATININE 0.9 12/25/2014 1122   CALCIUM 9.9 04/27/2020 1148   CALCIUM 10.1 12/25/2014 1122   PROT 7.3 04/27/2020 1148   PROT 7.8 12/25/2014 1122   ALBUMIN 3.3 (L) 04/27/2020 1148   ALBUMIN 3.4 (L) 12/25/2014 1122   AST 21 04/27/2020 1148   AST 21 12/25/2014 1122   ALT 7 04/27/2020 1148  ALT 17 12/25/2014 1122   ALKPHOS 70 04/27/2020 1148   ALKPHOS 99 12/25/2014 1122   BILITOT 0.3 04/27/2020 1148   BILITOT 0.41 12/25/2014 1122   GFRNONAA 46 (L) 04/27/2020 1148   GFRNONAA 58 (L) 02/27/2016 1530   GFRAA >60 02/15/2020 0940   GFRAA 66 02/27/2016 1530    No results found for: SPEP, UPEP  Lab Results  Component Value Date   WBC 6.5 04/27/2020   NEUTROABS 4.2 04/27/2020   HGB 8.2 (L) 04/27/2020   HCT 24.8 (L) 04/27/2020   MCV 103.8 (H) 04/27/2020   PLT 251 04/27/2020      Chemistry      Component Value Date/Time   NA 141 04/27/2020 1148   NA 142 12/25/2014 1122   K 4.5 04/27/2020 1148   K 4.1 12/25/2014 1122   CL 109 04/27/2020 1148   CL 102 10/04/2012 1159   CO2 24 04/27/2020 1148   CO2 29 12/25/2014 1122   BUN 28 (H) 04/27/2020 1148   BUN 13.7 12/25/2014 1122   CREATININE 1.20 (H) 04/27/2020 1148   CREATININE 0.96 (H) 02/27/2016 1530   CREATININE 0.9 12/25/2014 1122      Component Value Date/Time   CALCIUM 9.9 04/27/2020 1148   CALCIUM 10.1 12/25/2014 1122   ALKPHOS 70 04/27/2020 1148   ALKPHOS 99 12/25/2014 1122   AST 21 04/27/2020 1148   AST 21 12/25/2014 1122   ALT 7 04/27/2020 1148   ALT 17 12/25/2014 1122   BILITOT 0.3 04/27/2020 1148   BILITOT 0.41 12/25/2014 1122       RADIOGRAPHIC STUDIES: I have  reviewed multiple CT imaging with the patient and her daughter I have personally reviewed the radiological images as listed and agreed with the findings in the report. CT CHEST ABDOMEN PELVIS W CONTRAST  Result Date: 04/28/2020 CLINICAL DATA:  Uterine cancer.  Restaging. EXAM: CT CHEST, ABDOMEN, AND PELVIS WITH CONTRAST TECHNIQUE: Multidetector CT imaging of the chest, abdomen and pelvis was performed following the standard protocol during bolus administration of intravenous contrast. CONTRAST:  110m OMNIPAQUE IOHEXOL 300 MG/ML  SOLN COMPARISON:  CT AP 01/26/2020 and CT CAP from 12/20/2018 FINDINGS: CT CHEST FINDINGS Cardiovascular: Mild cardiac enlargement. Aortic atherosclerosis identified. Coronary artery atherosclerotic calcifications. Mediastinum/Nodes: Normal appearance of the thyroid gland. The trachea appears patent and is midline. Normal appearance of the esophagus. No enlarged axillary, supraclavicular, mediastinal, or hilar lymph nodes. Lungs/Pleura: There is no pleural effusion, airspace consolidation, or atelectasis. There is new subpleural nodularity along the right hemidiaphragm, image 103/6. Stable left upper lobe nodule measuring 2 mm, image 71/6. Also stable is a 4 mm left upper lobe lung nodule, image 59/6. 4 mm right upper lobe lung nodule was present on the previous exam, image 60/6. Within the left upper lobe there is a new subpleural nodule measuring 3 mm, image 54/6. Faint sub solid nodule within the right upper lobe is new measuring 3 mm. Musculoskeletal: No chest wall mass or suspicious bone lesions identified. CT ABDOMEN PELVIS FINDINGS Hepatobiliary: Stable low-density lesion within segment 4 a measuring 7 mm, image 51/2. Cyst within segment 7 is unchanged measuring 8 mm, image 58/2. No suspicious liver lesions. Gallbladder is unremarkable. No bile duct dilatation. Pancreas: Unremarkable. No pancreatic ductal dilatation or surrounding inflammatory changes. Spleen: Normal in size  without focal abnormality. Adrenals/Urinary Tract: Normal appearance of the adrenal glands. Similar appearance of left kidney cyst measuring 2.4 cm. No hydronephrosis identified bilaterally. Stomach/Bowel: The stomach appears nondistended. The appendix is visualized and  appears normal. No bowel wall thickening, inflammation, or distension. Vascular/Lymphatic: Mild aortic atherosclerosis. No aneurysm. The portal vein, splenic vein and portal venous confluence remains patent. Abdominopelvic adenopathy is again identified: Retrocaval lymph node measures 1.4 cm, image 62/2.  Previously 1 cm. Left retroperitoneal lymph node measures 1.1 cm, image 66/2. Previously 1.2 cm. Left common iliac node measures 1.2 cm, image 77/2. Previously 1.3 cm. Just above the bifurcation there is a 1.6 cm lymph node, image 85/2. Previously 1.3 cm. Left external iliac node measures 1.7 cm, image 99/2. Previously 1.4 cm. Left pelvic sidewall lymph node measures 1.7 cm, image 97/2. Previously 1.5 cm. Index right inguinal node measures 1.9 cm, image 105/2. Previously 1.3 cm. Reproductive: Soft tissue mass arising from the right side of cervix/lower uterine segment appears increased from previous exam. This measures approximately 3.4 x 2.1 cm, image 108/2. Previously 2.8 x 2.0 cm. This extends into the right lateral bladder base, image 68/4. Soft tissue mass arising from the left posterior cervix/lower uterine segment measures 4.8 x 3.5 cm. Previously this appeared as a focal area of loculated fluid measures 4.7 x 2.1 cm. The right adnexal mass measures 8.4 x 5.6 cm, image 94/2. This is compared with 7.9 x 5.9 cm previously. The left adnexal mass measures 5.9 x 3.7 cm, image 93/2. Previously 6.4 x 4.6 cm. 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 There is loculated fluid extending over the dome of right hepatic lobe with soft tissue thickening along the undersurface of the diaphragms, similar to previous study, image 45/4. Soft  tissue nodule within the omentum measures 1.5 x 1.8 cm, image 65/2. Previously 1.8 x 1.1 cm. Serosal implant involving the hepatic flexure is again noted measuring 2.9 x 1.8 cm, image 46/2. Similar to previous exam. Similar appearance of ill-defined, hazy low-attenuation infiltration within the porta hepatic region, image 54/2. Musculoskeletal: Degenerative changes noted within the lumbar spine. No acute or suspicious osseous findings. IMPRESSION: 1. Overall, no significant change in the appearance of peritoneal carcinomatosis. 2. Minimal change in size of large bilateral adnexal masses. 3. Mild progression of abdominal, pelvic and inguinal adenopathy. 4. Tumor arising from the right anterior cervix is again noted and appears increased in size with signs of tumor invasion into the right side of the urinary bladder. There has also been increase in tumor arising from the left posterior cervix. 5. New subpleural nodularity along the right hemidiaphragm. 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 No pleural effusion noted at this time. 6. No significant interval change in the appearance of small, nonspecific, multifocal pulmonary nodules 7. Coronary artery calcifications noted. 8. Aortic atherosclerosis. Aortic Atherosclerosis (ICD10-I70.0). Electronically Signed   By: Kerby Moors M.D.   On: 04/28/2020 10:32

## 2020-05-01 NOTE — Assessment & Plan Note (Signed)
I have reviewed her CT imaging extensively Overall, she have stability except for some pelvic lymph node and changes near her bladder She is completely asymptomatic She has near complete resolution of ascites and her tumor marker has dropped to half We discussed the risk and benefits of continuing treatment and she is in agreement She will return next month for her next cycle of treatment and she will get interim blood count monitoring and transfusion as needed if her hemoglobin drops to less than 8

## 2020-05-01 NOTE — Assessment & Plan Note (Signed)
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

## 2020-05-14 ENCOUNTER — Inpatient Hospital Stay: Payer: Medicare Other | Attending: Gynecologic Oncology

## 2020-05-14 ENCOUNTER — Other Ambulatory Visit: Payer: Self-pay

## 2020-05-14 ENCOUNTER — Inpatient Hospital Stay: Payer: Medicare Other

## 2020-05-14 ENCOUNTER — Inpatient Hospital Stay (HOSPITAL_BASED_OUTPATIENT_CLINIC_OR_DEPARTMENT_OTHER): Payer: Medicare Other | Admitting: Hematology and Oncology

## 2020-05-14 ENCOUNTER — Encounter: Payer: Self-pay | Admitting: Hematology and Oncology

## 2020-05-14 ENCOUNTER — Other Ambulatory Visit: Payer: Self-pay | Admitting: Hematology and Oncology

## 2020-05-14 DIAGNOSIS — Z9221 Personal history of antineoplastic chemotherapy: Secondary | ICD-10-CM | POA: Diagnosis not present

## 2020-05-14 DIAGNOSIS — C786 Secondary malignant neoplasm of retroperitoneum and peritoneum: Secondary | ICD-10-CM | POA: Insufficient documentation

## 2020-05-14 DIAGNOSIS — C55 Malignant neoplasm of uterus, part unspecified: Secondary | ICD-10-CM | POA: Diagnosis present

## 2020-05-14 DIAGNOSIS — Z923 Personal history of irradiation: Secondary | ICD-10-CM | POA: Insufficient documentation

## 2020-05-14 DIAGNOSIS — Z7901 Long term (current) use of anticoagulants: Secondary | ICD-10-CM | POA: Diagnosis not present

## 2020-05-14 DIAGNOSIS — D539 Nutritional anemia, unspecified: Secondary | ICD-10-CM

## 2020-05-14 DIAGNOSIS — Z5111 Encounter for antineoplastic chemotherapy: Secondary | ICD-10-CM | POA: Diagnosis not present

## 2020-05-14 DIAGNOSIS — N183 Chronic kidney disease, stage 3 unspecified: Secondary | ICD-10-CM

## 2020-05-14 DIAGNOSIS — Z853 Personal history of malignant neoplasm of breast: Secondary | ICD-10-CM | POA: Diagnosis not present

## 2020-05-14 DIAGNOSIS — D61818 Other pancytopenia: Secondary | ICD-10-CM | POA: Insufficient documentation

## 2020-05-14 DIAGNOSIS — Z7189 Other specified counseling: Secondary | ICD-10-CM

## 2020-05-14 DIAGNOSIS — Z86718 Personal history of other venous thrombosis and embolism: Secondary | ICD-10-CM | POA: Insufficient documentation

## 2020-05-14 DIAGNOSIS — Z79899 Other long term (current) drug therapy: Secondary | ICD-10-CM | POA: Insufficient documentation

## 2020-05-14 LAB — CBC WITH DIFFERENTIAL (CANCER CENTER ONLY)
Abs Immature Granulocytes: 0.03 10*3/uL (ref 0.00–0.07)
Basophils Absolute: 0 10*3/uL (ref 0.0–0.1)
Basophils Relative: 0 %
Eosinophils Absolute: 0.7 10*3/uL — ABNORMAL HIGH (ref 0.0–0.5)
Eosinophils Relative: 10 %
HCT: 25.2 % — ABNORMAL LOW (ref 36.0–46.0)
Hemoglobin: 8.3 g/dL — ABNORMAL LOW (ref 12.0–15.0)
Immature Granulocytes: 0 %
Lymphocytes Relative: 12 %
Lymphs Abs: 0.9 10*3/uL (ref 0.7–4.0)
MCH: 34.7 pg — ABNORMAL HIGH (ref 26.0–34.0)
MCHC: 32.9 g/dL (ref 30.0–36.0)
MCV: 105.4 fL — ABNORMAL HIGH (ref 80.0–100.0)
Monocytes Absolute: 1 10*3/uL (ref 0.1–1.0)
Monocytes Relative: 14 %
Neutro Abs: 4.5 10*3/uL (ref 1.7–7.7)
Neutrophils Relative %: 64 %
Platelet Count: 138 10*3/uL — ABNORMAL LOW (ref 150–400)
RBC: 2.39 MIL/uL — ABNORMAL LOW (ref 3.87–5.11)
RDW: 16.3 % — ABNORMAL HIGH (ref 11.5–15.5)
WBC Count: 7.1 10*3/uL (ref 4.0–10.5)
nRBC: 0 % (ref 0.0–0.2)

## 2020-05-14 LAB — SAMPLE TO BLOOD BANK

## 2020-05-14 LAB — CMP (CANCER CENTER ONLY)
ALT: 8 U/L (ref 0–44)
AST: 24 U/L (ref 15–41)
Albumin: 3.1 g/dL — ABNORMAL LOW (ref 3.5–5.0)
Alkaline Phosphatase: 71 U/L (ref 38–126)
Anion gap: 4 — ABNORMAL LOW (ref 5–15)
BUN: 23 mg/dL (ref 8–23)
CO2: 27 mmol/L (ref 22–32)
Calcium: 9.2 mg/dL (ref 8.9–10.3)
Chloride: 110 mmol/L (ref 98–111)
Creatinine: 1.3 mg/dL — ABNORMAL HIGH (ref 0.44–1.00)
GFR, Estimated: 42 mL/min — ABNORMAL LOW (ref >60)
Glucose, Bld: 74 mg/dL (ref 70–99)
Potassium: 4.4 mmol/L (ref 3.5–5.1)
Sodium: 141 mmol/L (ref 135–145)
Total Bilirubin: 0.3 mg/dL (ref 0.3–1.2)
Total Protein: 7.2 g/dL (ref 6.5–8.1)

## 2020-05-14 MED ORDER — FAMOTIDINE IN NACL 20-0.9 MG/50ML-% IV SOLN
INTRAVENOUS | Status: AC
  Filled 2020-05-14: qty 50

## 2020-05-14 MED ORDER — SODIUM CHLORIDE 0.9 % IV SOLN
Freq: Once | INTRAVENOUS | Status: AC
  Filled 2020-05-14: qty 250

## 2020-05-14 MED ORDER — SODIUM CHLORIDE 0.9 % IV SOLN
310.0000 mg | Freq: Once | INTRAVENOUS | Status: AC
  Administered 2020-05-14: 310 mg via INTRAVENOUS
  Filled 2020-05-14: qty 31

## 2020-05-14 MED ORDER — FAMOTIDINE IN NACL 20-0.9 MG/50ML-% IV SOLN
20.0000 mg | Freq: Once | INTRAVENOUS | Status: AC
  Administered 2020-05-14: 20 mg via INTRAVENOUS

## 2020-05-14 MED ORDER — HEPARIN SOD (PORK) LOCK FLUSH 100 UNIT/ML IV SOLN
500.0000 [IU] | Freq: Once | INTRAVENOUS | Status: AC | PRN
  Administered 2020-05-14: 500 [IU]
  Filled 2020-05-14: qty 5

## 2020-05-14 MED ORDER — DIPHENHYDRAMINE HCL 50 MG/ML IJ SOLN
INTRAMUSCULAR | Status: AC
  Filled 2020-05-14: qty 1

## 2020-05-14 MED ORDER — PALONOSETRON HCL INJECTION 0.25 MG/5ML
0.2500 mg | Freq: Once | INTRAVENOUS | Status: AC
  Administered 2020-05-14: 0.25 mg via INTRAVENOUS

## 2020-05-14 MED ORDER — CYANOCOBALAMIN 1000 MCG/ML IJ SOLN
1000.0000 ug | Freq: Once | INTRAMUSCULAR | Status: AC
  Administered 2020-05-14: 1000 ug via INTRAMUSCULAR

## 2020-05-14 MED ORDER — PALONOSETRON HCL INJECTION 0.25 MG/5ML
INTRAVENOUS | Status: AC
  Filled 2020-05-14: qty 5

## 2020-05-14 MED ORDER — SODIUM CHLORIDE 0.9% FLUSH
10.0000 mL | INTRAVENOUS | Status: DC | PRN
  Administered 2020-05-14: 10 mL
  Filled 2020-05-14: qty 10

## 2020-05-14 MED ORDER — DIPHENHYDRAMINE HCL 50 MG/ML IJ SOLN
25.0000 mg | Freq: Once | INTRAMUSCULAR | Status: AC
  Administered 2020-05-14: 25 mg via INTRAVENOUS

## 2020-05-14 MED ORDER — CYANOCOBALAMIN 1000 MCG/ML IJ SOLN
INTRAMUSCULAR | Status: AC
  Filled 2020-05-14: qty 1

## 2020-05-14 MED ORDER — SODIUM CHLORIDE 0.9 % IV SOLN
10.0000 mg | Freq: Once | INTRAVENOUS | Status: AC
  Administered 2020-05-14: 10 mg via INTRAVENOUS
  Filled 2020-05-14: qty 10

## 2020-05-14 MED ORDER — SODIUM CHLORIDE 0.9 % IV SOLN
150.0000 mg | Freq: Once | INTRAVENOUS | Status: AC
  Administered 2020-05-14: 150 mg via INTRAVENOUS
  Filled 2020-05-14: qty 150

## 2020-05-14 NOTE — Progress Notes (Signed)
[facility] OFFICE PROGRESS NOTE  Patient Care Team: [name], DO as PCP - General (Family Medicine) [name], MD as Consulting Physician (Ophthalmology) Wallene Huh, DPM as Consulting Physician (Podiatry) [name], [name], MD as Referring Physician (Optometry) [name], MD (Inactive) as Consulting Physician (Orthopedic Surgery) [name], RN as Oncology Nurse Navigator (Oncology)  ASSESSMENT & PLAN:  Uterine cancer [facility]) Her last Ct imaging showed stability except for some pelvic lymph node and changes near her bladder She is completely asymptomatic She has near complete resolution of ascites and her tumor marker has dropped to half We discussed the risk and benefits of continuing treatment and she is in agreement We will proceed with treatment as scheduled  She will get interim blood count monitoring and transfusion as needed if her hemoglobin drops to less than 8.  If her hemoglobin is less than 8, she will get 1 unit of blood  Pancytopenia, acquired ([place]) She has severe pancytopenia due to treatment She is not symptomatic She does not need transfusion support today There is no contraindication to remain on antiplatelet agents or anticoagulants as long as the platelet is greater than 50,000.   CKD (chronic kidney disease), stage III (HCC) Her serum creatinine is satisfactory We discussed the importance of adequate hydration and risk factor modification I will adjust the dose of her chemotherapy accordingly   No orders of the defined types were placed in this encounter.   All questions were answered. The patient knows to call the clinic with any problems, questions or concerns. The total time spent in the appointment was 20 minutes encounter with patients including review of chart and various tests results, discussions about plan of care and coordination of care plan   [name], MD 05/14/2020 12:45 PM  INTERVAL  HISTORY: Please see below for problem oriented charting. She returns for chemotherapy and follow-up She is feeling well Denies recent nausea or changes in bowel habits She takes MiraLAX on a regular basis to avoid constipation  The patient denies any recent signs or symptoms of bleeding such as spontaneous epistaxis, hematuria or hematochezia.  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 Ultrasound irregular hypoechoic mass 7 x 6 x 5 mm   01/06/2012 Initial Biopsy   Initial biopsy was benign . 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 Cervix, biopsy - HIGH GRADE CARCINOMA. - SEE COMMENT. 2. Endocervix, curettage - HIGH GRADE CARCINOMA. - SEE COMMENT. 3. Endometrium, biopsy - HIGH GRADE CARCINOMA. - SEE COMMENT. Microscopic Comment 1. - 3.  The carcinoma in the three specimens is morphologically similar and has features suggestive of papillary serous carcinoma.   07/26/2018 Tumor Marker   Patient's tumor was tested for the following markers: CA-125 Results of the tumor marker test revealed 835    07/28/2018 Imaging   1. Several (at least 10) solid pulmonary nodules scattered throughout both lungs are new since 2012 chest CT, likely representing pulmonary metastases, largest 6 mm, below PET resolution. 2. Redemonstration of hypodense 2.5 cm segment 7 right liver lobe mass, indeterminate, suspicious for liver metastasis. 3. 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 4. Three-vessel coronary atherosclerosis.  Aortic Atherosclerosis (ICD10-I70.0).   08/03/2018 Cancer Staging   Staging form: Corpus Uteri - Carcinoma and Carcinosarcoma, AJCC 8th Edition - Clinical: FIGO Stage IVB (cT3b, cN2, cM1) - Signed by [name], MD on 08/03/2018   08/05/2018 Tumor Marker   Patient's tumor was tested for the following markers: CA-125 Results of the tumor marker test revealed 901   08/06/2018 Procedure   Placement of single lumen port a cath via right internal jugular vein. The catheter tip lies at the cavo-atrial junction. A power injectable port a cath was placed and is ready for immediate use.    08/11/2018 - 01/12/2019 Chemotherapy   The patient had carboplatin and taxol x 8 cycles   09/01/2018 Tumor Marker   Patient's tumor was tested for the following markers: CA-125 Results of the tumor marker test revealed 406   09/09/2018 Genetic Testing   The Common Hereditary Cancers Panel offered by [facility] includes sequencing and/or deletion duplication testing of the following 48 genes: APC, ATM, AXIN2, BARD1, BMPR1A, BRCA1, BRCA2, BRIP1, CDH1, CDKN2A (p14ARF), CDKN2A (p16INK4a), CKD4, CHEK2, CTNNA1, DICER1, EPCAM (Deletion/duplication testing only), GREM1 (promoter region deletion/duplication testing only), KIT, MEN1, MLH1, MSH2, MSH3, MSH6, MUTYH, NBN, NF1, NHTL1, PALB2, PDGFRA, PMS2, POLD1, POLE, PTEN, RAD50, RAD51C, RAD51D, RNF43, SDHB, SDHC, SDHD, SMAD4, SMARCA4. STK11, TP53, TSC1, TSC2, and VHL.  The following genes were evaluated for sequence changes only: SDHA and HOXB13  c.251G>A variant only.  Results: Negative, no pathogenic variants identified. The report date is 09/09/2018.     09/22/2018 Tumor Marker   Patient's tumor was tested for the following markers: CA-125 Results of the tumor marker test revealed 110   10/12/2018 Tumor Marker   Patient's tumor was tested for the following markers: CA-125 Results of the tumor marker test revealed 55.3   10/12/2018 Imaging   1. Mild decrease in pulmonary metastases, liver metastases, and metastatic abdominal and pelvic lymphadenopathy. 2. Decreased size of complex cystic lesion in left presacral region. 3. No new or progressive metastatic disease identified. 4. Stable small right inguinal hernia containing small portion of urinary bladder.   11/03/2018 Tumor Marker   Patient's tumor was tested for the following markers: CA-125 Results of the tumor marker test revealed 34.3    Genetic Testing   Patient has genetic testing done for HER2. Results revealed patient has the following: HER2 - negative from pathology from 07/26/18.   12/20/2018 Imaging   1. Possible new omental nodule. 2. Improving abdominal and pelvic retroperitoneal adenopathy. 3. Stable pulmonary and hepatic metastatic disease. 4. Left perirectal/presacral fluid collection has decreased in size. 5. Right inguinal hernia contains a small portion of the bladder, as before. 6. Aortic atherosclerosis (ICD10-170.0). Coronary artery calcification   12/21/2018 Tumor Marker   Patient's tumor was tested for the following markers: CA-125 Results of the tumor marker test revealed 15.6   01/15/2019 Imaging   Ct abdomen  and pelvis 1. Small hiatal hernia with fluid in the distal esophagus, which may represent reflux or esophagitis. No other acute findings. 2. Right inguinal hernia contains anterolateral aspect of the urinary bladder, unchanged in appearance from prior. Mild edema within the hernia sac is similar. 3. Unchanged small low-density lesions in the  liver. 4. Unchanged retroperitoneal and bilateral iliac lymph nodes. Small ventral abdominal omental nodule tentatively identified and unchanged from prior.   Aortic Atherosclerosis (ICD10-I70.0).   02/02/2019 Tumor Marker   Patient's tumor was tested for the following markers: CA-125 Results of the tumor marker test revealed 7.8   03/22/2019 Surgery   Preoperative diagnosis: History of incarcerated right inguinal hernia   Postop diagnosis: Incarcerated right inguinal hernia with preperitoneal fat without strangulation or obstruction   Procedure: Repair of right inguinal hernia with ultra Pro hernia system mesh   Surgeon: Erroll Luna, MD   03/31/2019 Tumor Marker   Patient's tumor was tested for the following markers: CA-`125 Results of the tumor marker test revealed 24.4.   05/25/2019 Tumor Marker   Patient's tumor was tested for the following markers: CA-125 Results of the tumor marker test revealed 287   05/25/2019 Imaging   New solid bilateral adnexal masses, consistent with metastatic disease.   New peritoneal soft tissue nodules in the anterior right pelvis, suspicious for peritoneal carcinomatosis. No evidence of ascites.   No significant change in shotty sub-cm abdominal retroperitoneal and bilateral iliac lymph nodes.   06/09/2019 - 01/17/2020 Chemotherapy   The patient had pembrolizumab for chemotherapy treatment.     06/09/2019 Tumor Marker   Patient's tumor was tested for the following markers: CA-125 Results of the tumor marker test revealed 468   07/26/2019 Tumor Marker   Patient's tumor was tested for the following markers: CA-125 Results of the tumor marker test revealed 282   08/17/2019 Tumor Marker   Patient's tumor was tested for the following markers: CA-125 Results of the tumor marker test revealed 190.   09/05/2019 Imaging   1. Subtle nodularity along the transverse mesocolon measuring 11 mm is concerning for peritoneal carcinomatosis. Also associated  with perihepatic ascites and enlarging juxta diaphragmatic nodal disease.  Attention on follow-up. 2. Bilateral adnexal masses are similar in size to the previous exam. 3. Left para-aortic lymph node is slightly increased in size from the previous exam, slight interval enlargement is suspicious based on location for nodal involvement. Scattered subcentimeter lymph nodes throughout the left and right pelvis are similar to the previous exam. 4. Calcified coronary artery disease. 5. Low-density lesions in the liver are unchanged. 6. Persistent endometrial thickening not well assessed on CT. 7. Stranding in the right inguinal region tracking towards the abdominal wall likely related to prior inguinal hernia repair is unchanged. Area of discrete nodularity in this location is no longer seen.   Aortic Atherosclerosis (ICD10-I70.0).       09/07/2019 Tumor Marker   Patient's tumor was tested for the following markers: CA-125 Results of the tumor marker test revealed 214   11/08/2019 Imaging   1. Multiple mild-to-moderate severity thickened small bowel loops throughout the abdomen with associated peri intestinal inflammatory fat stranding. While this may represent an infectious or inflammatory enteritis, the presence of an underlying neoplastic process cannot be excluded. 2. Large amount of perihepatic fluid. 3. Stable bilateral adnexal masses, likely ovarian in origin. Correlation with pelvic ultrasound is recommended. 4. Small foci of parenchymal low attenuation within the right lobe of the liver which may represent small cysts or  hemangiomas. 5. Stable left renal cyst. 6. Aortic atherosclerosis.   Aortic Atherosclerosis (ICD10-I70.0).     11/16/2019 Tumor Marker   Patient's tumor was tested for the following markers: CA-125 Results of the tumor marker test revealed 456   12/07/2019 Tumor Marker   Patient's tumor was tested for the following markers: CA-125. Results of the tumor marker test  revealed 676   01/17/2020 Tumor Marker   Patient's tumor was tested for the following markers: CA-125 Results of the tumor marker test revealed 1333   01/26/2020 Imaging   Increased peritoneal carcinomatosis, mild ascites, and tiny right pleural effusion. Increased size of bilateral adnexal masses, consistent with metastatic disease.   New soft tissue density involving the right anterior cervix and right bladder base, consistent with metastatic disease.   Mildly increased abdominal and pelvic lymphadenopathy, consistent with metastatic disease.   Aortic Atherosclerosis (ICD10-I70.0).     02/15/2020 -  Chemotherapy   The patient had carboplatin for chemotherapy treatment.     02/15/2020 Tumor Marker   Patient's tumor was tested for the following markers: CA-125 Results of the tumor marker test revealed 1784   03/07/2020 Tumor Marker   Patient's tumor was tested for the following markers: CA-125 Results of the tumor marker test revealed 1037   04/16/2020 Tumor Marker   Patient's tumor was tested for the following markers: CA-125 Results of the tumor marker test revealed 718   04/27/2020 Imaging   1. Overall, no significant change in the appearance of peritoneal carcinomatosis. 2. Minimal change in size of large bilateral adnexal masses. 3. Mild progression of abdominal, pelvic and inguinal adenopathy. 4. Tumor arising from the right anterior cervix is again noted and appears increased in size with signs of tumor invasion into the right side of the urinary bladder. There has also been increase in tumor arising from the left posterior cervix. 5. New subpleural nodularity along the right hemidiaphragm. 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 No pleural effusion noted at this time. 6. No significant interval change in the appearance of small, nonspecific, multifocal pulmonary nodules 7. Coronary artery calcifications noted. 8. Aortic  atherosclerosis.     REVIEW OF SYSTEMS:   Constitutional: Denies fevers, chills or abnormal weight loss Eyes: Denies blurriness of vision Ears, nose, mouth, throat, and face: Denies mucositis or sore throat Respiratory: Denies cough, dyspnea or wheezes Cardiovascular: Denies palpitation, chest discomfort or lower extremity swelling Gastrointestinal:  Denies nausea, heartburn or change in bowel habits Skin: Denies abnormal skin rashes Lymphatics: Denies new lymphadenopathy or easy bruising Neurological:Denies numbness, tingling or new weaknesses Behavioral/Psych: Mood is stable, no new changes  All other systems were reviewed with the patient and are negative.  I have reviewed the past medical history, past surgical history, social history and family history with the patient and they are unchanged from previous note.  ALLERGIES:  is allergic to zofran [ondansetron hcl].  MEDICATIONS:  Current Outpatient Medications  Medication Sig Dispense Refill  . acetaminophen (TYLENOL) 500 MG tablet Take 1 tablet (500 mg total) by mouth every 6 (six) hours as needed. (Patient taking differently: Take 1,000 mg by mouth every 6 (six) hours as needed for moderate pain. ) 30 tablet 3  . Calcium Carbonate (CALTRATE 600 PO) Take 1 tablet by mouth 2 (two) times daily.     . enalapril (VASOTEC) 20 MG tablet Take 1 tablet (20 mg total) by mouth daily. 90 tablet 1  . Fluticasone-Salmeterol (ADVAIR DISKUS) 100-50 MCG/DOSE AEPB USE 1 INHALATION  BY MOUTH TWICE DAILY 60 each 2  . furosemide (LASIX) 40 MG tablet Take 1 tablet (40 mg total) by mouth daily as needed (leg swelling). 90 tablet 1  . levothyroxine (SYNTHROID) 125 MCG tablet Take 1 tablet (125 mcg total) by mouth daily before breakfast. 30 tablet 11  . lidocaine-prilocaine (EMLA) cream Apply to affected area once (Patient taking differently: Apply 1 application topically daily as needed. Apply to affected area once) 30 g 3  . Multiple Vitamin  (MULTIVITAMIN) tablet Take 1 tablet by mouth daily.    . Multiple Vitamins-Minerals (ICAPS AREDS 2 PO) Take 1 capsule by mouth 2 (two) times daily.    . ondansetron (ZOFRAN) 8 MG tablet Take 1 tablet (8 mg total) by mouth every 8 (eight) hours as needed. Start on the third day after chemotherapy. 30 tablet 1  . oxyCODONE (OXY IR/ROXICODONE) 5 MG immediate release tablet Take 1 tablet (5 mg total) by mouth every 6 (six) hours as needed for severe pain. 60 tablet 0  . polyethylene glycol (MIRALAX / GLYCOLAX) 17 g packet Take 17 g by mouth daily as needed for moderate constipation.    . prochlorperazine (COMPAZINE) 10 MG tablet Take 1 tablet (10 mg total) by mouth every 6 (six) hours as needed (Nausea or vomiting). 60 tablet 1  . rivaroxaban (XARELTO) 20 MG TABS tablet Take 1 tablet (20 mg total) by mouth daily with supper. 90 tablet 3  . rosuvastatin (CRESTOR) 10 MG tablet Take 1 tablet (10 mg total) by mouth daily. Start with 20m for the first week. 90 tablet 0   No current facility-administered medications for this visit.    PHYSICAL EXAMINATION: ECOG PERFORMANCE STATUS: 1 - Symptomatic but completely ambulatory  Vitals:   05/14/20 1226  BP: 120/61  Pulse: 95  Resp: 17  Temp: 97.7 F (36.5 C)  SpO2: 100%   Filed Weights   05/14/20 1226  Weight: 209 lb 6.4 oz (95 kg)    GENERAL:alert, no distress and comfortable SKIN: skin color, texture, turgor are normal, no rashes or significant lesions EYES: normal, Conjunctiva are pink and non-injected, sclera clear OROPHARYNX:no exudate, no erythema and lips, buccal mucosa, and tongue normal  NECK: supple, thyroid normal size, non-tender, without nodularity LYMPH:  no palpable lymphadenopathy in the cervical, axillary or inguinal LUNGS: clear to auscultation and percussion with normal breathing effort HEART: regular rate & rhythm and no murmurs and no lower extremity edema ABDOMEN:abdomen soft, non-tender and normal bowel  sounds Musculoskeletal:no cyanosis of digits and no clubbing  NEURO: alert & oriented x 3 with fluent speech, no focal motor/sensory deficits  LABORATORY DATA:  I have reviewed the data as listed    Component Value Date/Time   NA 141 05/14/2020 1150   NA 142 12/25/2014 1122   K 4.4 05/14/2020 1150   K 4.1 12/25/2014 1122   CL 110 05/14/2020 1150   CL 102 10/04/2012 1159   CO2 27 05/14/2020 1150   CO2 29 12/25/2014 1122   GLUCOSE 74 05/14/2020 1150   GLUCOSE 99 12/25/2014 1122   GLUCOSE 116 (H) 10/04/2012 1159   BUN 23 05/14/2020 1150   BUN 13.7 12/25/2014 1122   CREATININE 1.30 (H) 05/14/2020 1150   CREATININE 0.96 (H) 02/27/2016 1530   CREATININE 0.9 12/25/2014 1122   CALCIUM 9.2 05/14/2020 1150   CALCIUM 10.1 12/25/2014 1122   PROT 7.2 05/14/2020 1150   PROT 7.8 12/25/2014 1122   ALBUMIN 3.1 (L) 05/14/2020 1150   ALBUMIN 3.4 (L) 12/25/2014  1122   AST 24 05/14/2020 1150   AST 21 12/25/2014 1122   ALT 8 05/14/2020 1150   ALT 17 12/25/2014 1122   ALKPHOS 71 05/14/2020 1150   ALKPHOS 99 12/25/2014 1122   BILITOT 0.3 05/14/2020 1150   BILITOT 0.41 12/25/2014 1122   GFRNONAA 42 (L) 05/14/2020 1150   GFRNONAA 58 (L) 02/27/2016 1530   GFRAA >60 02/15/2020 0940   GFRAA 66 02/27/2016 1530    No results found for: SPEP, UPEP  Lab Results  Component Value Date   WBC 7.1 05/14/2020   NEUTROABS 4.5 05/14/2020   HGB 8.3 (L) 05/14/2020   HCT 25.2 (L) 05/14/2020   MCV 105.4 (H) 05/14/2020   PLT 138 (L) 05/14/2020      Chemistry      Component Value Date/Time   NA 141 05/14/2020 1150   NA 142 12/25/2014 1122   K 4.4 05/14/2020 1150   K 4.1 12/25/2014 1122   CL 110 05/14/2020 1150   CL 102 10/04/2012 1159   CO2 27 05/14/2020 1150   CO2 29 12/25/2014 1122   BUN 23 05/14/2020 1150   BUN 13.7 12/25/2014 1122   CREATININE 1.30 (H) 05/14/2020 1150   CREATININE 0.96 (H) 02/27/2016 1530   CREATININE 0.9 12/25/2014 1122      Component Value Date/Time   CALCIUM 9.2  05/14/2020 1150   CALCIUM 10.1 12/25/2014 1122   ALKPHOS 71 05/14/2020 1150   ALKPHOS 99 12/25/2014 1122   AST 24 05/14/2020 1150   AST 21 12/25/2014 1122   ALT 8 05/14/2020 1150   ALT 17 12/25/2014 1122   BILITOT 0.3 05/14/2020 1150   BILITOT 0.41 12/25/2014 1122

## 2020-05-14 NOTE — Assessment & Plan Note (Signed)
She has severe pancytopenia due to treatment She is not symptomatic She does not need transfusion support today There is no contraindication to remain on antiplatelet agents or anticoagulants as long as the platelet is greater than 50,000.

## 2020-05-14 NOTE — Progress Notes (Signed)
Patient completed infusion without incident. In no visible distress at time of discharge. Assisted out of cancer center. AVS provided.

## 2020-05-14 NOTE — Assessment & Plan Note (Signed)
Her last Ct imaging showed stability except for some pelvic lymph node and changes near her bladder She is completely asymptomatic She has near complete resolution of ascites and her tumor marker has dropped to half We discussed the risk and benefits of continuing treatment and she is in agreement We will proceed with treatment as scheduled  She will get interim blood count monitoring and transfusion as needed if her hemoglobin drops to less than 8.  If her hemoglobin is less than 8, she will get 1 unit of blood

## 2020-05-14 NOTE — Patient Instructions (Signed)
[facility] Discharge Instructions for Patients Receiving Chemotherapy  Today you received the following chemotherapy agents: carboplatin.  To help prevent nausea and vomiting after your treatment, we encourage you to take your nausea medication as directed.   If you develop nausea and vomiting that is not controlled by your nausea medication, call the clinic.   BELOW ARE SYMPTOMS THAT SHOULD BE REPORTED IMMEDIATELY:  *FEVER GREATER THAN 100.5 F  *CHILLS WITH OR WITHOUT FEVER  NAUSEA AND VOMITING THAT IS NOT CONTROLLED WITH YOUR NAUSEA MEDICATION  *UNUSUAL SHORTNESS OF BREATH  *UNUSUAL BRUISING OR BLEEDING  TENDERNESS IN MOUTH AND THROAT WITH OR WITHOUT PRESENCE OF ULCERS  *URINARY PROBLEMS  *BOWEL PROBLEMS  UNUSUAL RASH Items with * indicate a potential emergency and should be followed up as soon as possible.  Feel free to call the clinic should you have any questions or concerns. The clinic phone number is [phone].  Please show the CHEMO ALERT CARD at check-in to the Emergency Department and triage nurse.   

## 2020-05-14 NOTE — Assessment & Plan Note (Signed)
Her serum creatinine is satisfactory We discussed the importance of adequate hydration and risk factor modification I will adjust the dose of her chemotherapy accordingly

## 2020-05-14 NOTE — Patient Instructions (Signed)

## 2020-05-23 ENCOUNTER — Other Ambulatory Visit: Payer: Self-pay | Admitting: Family Medicine

## 2020-05-30 ENCOUNTER — Other Ambulatory Visit: Payer: Self-pay

## 2020-05-30 ENCOUNTER — Inpatient Hospital Stay: Payer: Medicare Other

## 2020-05-30 ENCOUNTER — Other Ambulatory Visit: Payer: Medicare Other

## 2020-05-30 DIAGNOSIS — C55 Malignant neoplasm of uterus, part unspecified: Secondary | ICD-10-CM

## 2020-05-30 DIAGNOSIS — Z7901 Long term (current) use of anticoagulants: Secondary | ICD-10-CM | POA: Diagnosis not present

## 2020-05-30 DIAGNOSIS — Z9221 Personal history of antineoplastic chemotherapy: Secondary | ICD-10-CM | POA: Diagnosis not present

## 2020-05-30 DIAGNOSIS — Z923 Personal history of irradiation: Secondary | ICD-10-CM | POA: Diagnosis not present

## 2020-05-30 DIAGNOSIS — D61818 Other pancytopenia: Secondary | ICD-10-CM | POA: Diagnosis not present

## 2020-05-30 DIAGNOSIS — Z86718 Personal history of other venous thrombosis and embolism: Secondary | ICD-10-CM | POA: Diagnosis not present

## 2020-05-30 DIAGNOSIS — Z5111 Encounter for antineoplastic chemotherapy: Secondary | ICD-10-CM | POA: Diagnosis not present

## 2020-05-30 DIAGNOSIS — Z853 Personal history of malignant neoplasm of breast: Secondary | ICD-10-CM | POA: Diagnosis not present

## 2020-05-30 DIAGNOSIS — R946 Abnormal results of thyroid function studies: Secondary | ICD-10-CM

## 2020-05-30 DIAGNOSIS — C786 Secondary malignant neoplasm of retroperitoneum and peritoneum: Secondary | ICD-10-CM | POA: Diagnosis not present

## 2020-05-30 DIAGNOSIS — Z79899 Other long term (current) drug therapy: Secondary | ICD-10-CM | POA: Diagnosis not present

## 2020-05-30 DIAGNOSIS — D539 Nutritional anemia, unspecified: Secondary | ICD-10-CM

## 2020-05-30 DIAGNOSIS — N183 Chronic kidney disease, stage 3 unspecified: Secondary | ICD-10-CM | POA: Diagnosis not present

## 2020-05-30 LAB — CMP (CANCER CENTER ONLY)
ALT: 10 U/L (ref 0–44)
AST: 28 U/L (ref 15–41)
Albumin: 3.4 g/dL — ABNORMAL LOW (ref 3.5–5.0)
Alkaline Phosphatase: 82 U/L (ref 38–126)
Anion gap: 6 (ref 5–15)
BUN: 15 mg/dL (ref 8–23)
CO2: 28 mmol/L (ref 22–32)
Calcium: 10 mg/dL (ref 8.9–10.3)
Chloride: 104 mmol/L (ref 98–111)
Creatinine: 1.14 mg/dL — ABNORMAL HIGH (ref 0.44–1.00)
GFR, Estimated: 48 mL/min — ABNORMAL LOW (ref >60)
Glucose, Bld: 76 mg/dL (ref 70–99)
Potassium: 4.5 mmol/L (ref 3.5–5.1)
Sodium: 138 mmol/L (ref 135–145)
Total Bilirubin: 0.3 mg/dL (ref 0.3–1.2)
Total Protein: 8.4 g/dL — ABNORMAL HIGH (ref 6.5–8.1)

## 2020-05-30 LAB — CBC WITH DIFFERENTIAL (CANCER CENTER ONLY)
Abs Immature Granulocytes: 0.03 10*3/uL (ref 0.00–0.07)
Basophils Absolute: 0 10*3/uL (ref 0.0–0.1)
Basophils Relative: 1 %
Eosinophils Absolute: 0.5 10*3/uL (ref 0.0–0.5)
Eosinophils Relative: 8 %
HCT: 28.4 % — ABNORMAL LOW (ref 36.0–46.0)
Hemoglobin: 9.2 g/dL — ABNORMAL LOW (ref 12.0–15.0)
Immature Granulocytes: 1 %
Lymphocytes Relative: 14 %
Lymphs Abs: 0.8 10*3/uL (ref 0.7–4.0)
MCH: 34.5 pg — ABNORMAL HIGH (ref 26.0–34.0)
MCHC: 32.4 g/dL (ref 30.0–36.0)
MCV: 106.4 fL — ABNORMAL HIGH (ref 80.0–100.0)
Monocytes Absolute: 0.6 10*3/uL (ref 0.1–1.0)
Monocytes Relative: 10 %
Neutro Abs: 4.1 10*3/uL (ref 1.7–7.7)
Neutrophils Relative %: 66 %
Platelet Count: 215 10*3/uL (ref 150–400)
RBC: 2.67 MIL/uL — ABNORMAL LOW (ref 3.87–5.11)
RDW: 14.6 % (ref 11.5–15.5)
WBC Count: 6.1 10*3/uL (ref 4.0–10.5)
nRBC: 0 % (ref 0.0–0.2)

## 2020-05-30 LAB — SAMPLE TO BLOOD BANK

## 2020-05-30 LAB — TSH: TSH: 4.614 u[IU]/mL — ABNORMAL HIGH (ref 0.308–3.960)

## 2020-06-09 ENCOUNTER — Other Ambulatory Visit: Payer: Self-pay | Admitting: Family Medicine

## 2020-06-11 ENCOUNTER — Encounter: Payer: Self-pay | Admitting: Hematology and Oncology

## 2020-06-11 ENCOUNTER — Inpatient Hospital Stay: Payer: Medicare Other

## 2020-06-11 ENCOUNTER — Other Ambulatory Visit: Payer: Self-pay | Admitting: Hematology and Oncology

## 2020-06-11 ENCOUNTER — Other Ambulatory Visit: Payer: Self-pay

## 2020-06-11 ENCOUNTER — Inpatient Hospital Stay (HOSPITAL_BASED_OUTPATIENT_CLINIC_OR_DEPARTMENT_OTHER): Payer: Medicare Other | Admitting: Hematology and Oncology

## 2020-06-11 ENCOUNTER — Inpatient Hospital Stay: Payer: Medicare Other | Attending: Gynecologic Oncology

## 2020-06-11 DIAGNOSIS — T451X5A Adverse effect of antineoplastic and immunosuppressive drugs, initial encounter: Secondary | ICD-10-CM

## 2020-06-11 DIAGNOSIS — D518 Other vitamin B12 deficiency anemias: Secondary | ICD-10-CM | POA: Diagnosis not present

## 2020-06-11 DIAGNOSIS — N183 Chronic kidney disease, stage 3 unspecified: Secondary | ICD-10-CM

## 2020-06-11 DIAGNOSIS — R300 Dysuria: Secondary | ICD-10-CM | POA: Diagnosis not present

## 2020-06-11 DIAGNOSIS — R946 Abnormal results of thyroid function studies: Secondary | ICD-10-CM

## 2020-06-11 DIAGNOSIS — D6481 Anemia due to antineoplastic chemotherapy: Secondary | ICD-10-CM | POA: Diagnosis not present

## 2020-06-11 DIAGNOSIS — C55 Malignant neoplasm of uterus, part unspecified: Secondary | ICD-10-CM

## 2020-06-11 DIAGNOSIS — Z5111 Encounter for antineoplastic chemotherapy: Secondary | ICD-10-CM | POA: Insufficient documentation

## 2020-06-11 DIAGNOSIS — E538 Deficiency of other specified B group vitamins: Secondary | ICD-10-CM | POA: Diagnosis not present

## 2020-06-11 DIAGNOSIS — D539 Nutritional anemia, unspecified: Secondary | ICD-10-CM

## 2020-06-11 DIAGNOSIS — Z7189 Other specified counseling: Secondary | ICD-10-CM

## 2020-06-11 LAB — CBC WITH DIFFERENTIAL (CANCER CENTER ONLY)
Abs Immature Granulocytes: 0.05 10*3/uL (ref 0.00–0.07)
Basophils Absolute: 0 10*3/uL (ref 0.0–0.1)
Basophils Relative: 1 %
Eosinophils Absolute: 0.4 10*3/uL (ref 0.0–0.5)
Eosinophils Relative: 4 %
HCT: 26.3 % — ABNORMAL LOW (ref 36.0–46.0)
Hemoglobin: 8.4 g/dL — ABNORMAL LOW (ref 12.0–15.0)
Immature Granulocytes: 1 %
Lymphocytes Relative: 9 %
Lymphs Abs: 0.8 10*3/uL (ref 0.7–4.0)
MCH: 34.3 pg — ABNORMAL HIGH (ref 26.0–34.0)
MCHC: 31.9 g/dL (ref 30.0–36.0)
MCV: 107.3 fL — ABNORMAL HIGH (ref 80.0–100.0)
Monocytes Absolute: 1.1 10*3/uL — ABNORMAL HIGH (ref 0.1–1.0)
Monocytes Relative: 13 %
Neutro Abs: 6.3 10*3/uL (ref 1.7–7.7)
Neutrophils Relative %: 72 %
Platelet Count: 194 10*3/uL (ref 150–400)
RBC: 2.45 MIL/uL — ABNORMAL LOW (ref 3.87–5.11)
RDW: 13.6 % (ref 11.5–15.5)
WBC Count: 8.8 10*3/uL (ref 4.0–10.5)
nRBC: 0 % (ref 0.0–0.2)

## 2020-06-11 LAB — URINALYSIS, COMPLETE (UACMP) WITH MICROSCOPIC
Bilirubin Urine: NEGATIVE
Glucose, UA: NEGATIVE mg/dL
Hgb urine dipstick: NEGATIVE
Ketones, ur: NEGATIVE mg/dL
Leukocytes,Ua: NEGATIVE
Nitrite: NEGATIVE
Protein, ur: 30 mg/dL — AB
Specific Gravity, Urine: 1.015 (ref 1.005–1.030)
pH: 8 (ref 5.0–8.0)

## 2020-06-11 LAB — SAMPLE TO BLOOD BANK

## 2020-06-11 LAB — COMPREHENSIVE METABOLIC PANEL
ALT: 8 U/L (ref 0–44)
AST: 30 U/L (ref 15–41)
Albumin: 3.1 g/dL — ABNORMAL LOW (ref 3.5–5.0)
Alkaline Phosphatase: 80 U/L (ref 38–126)
Anion gap: 5 (ref 5–15)
BUN: 21 mg/dL (ref 8–23)
CO2: 24 mmol/L (ref 22–32)
Calcium: 9.7 mg/dL (ref 8.9–10.3)
Chloride: 107 mmol/L (ref 98–111)
Creatinine, Ser: 1.12 mg/dL — ABNORMAL HIGH (ref 0.44–1.00)
GFR, Estimated: 49 mL/min — ABNORMAL LOW (ref >60)
Glucose, Bld: 88 mg/dL (ref 70–99)
Potassium: 4.9 mmol/L (ref 3.5–5.1)
Sodium: 136 mmol/L (ref 135–145)
Total Bilirubin: 0.3 mg/dL (ref 0.3–1.2)
Total Protein: 7.8 g/dL (ref 6.5–8.1)

## 2020-06-11 MED ORDER — FOSAPREPITANT DIMEGLUMINE INJECTION 150 MG
150.0000 mg | Freq: Once | INTRAVENOUS | Status: AC
  Administered 2020-06-11: 150 mg via INTRAVENOUS
  Filled 2020-06-11: qty 150

## 2020-06-11 MED ORDER — HEPARIN SOD (PORK) LOCK FLUSH 100 UNIT/ML IV SOLN
500.0000 [IU] | Freq: Once | INTRAVENOUS | Status: AC | PRN
  Administered 2020-06-11: 500 [IU]
  Filled 2020-06-11: qty 5

## 2020-06-11 MED ORDER — SODIUM CHLORIDE 0.9 % IV SOLN
10.0000 mg | Freq: Once | INTRAVENOUS | Status: AC
  Administered 2020-06-11: 10 mg via INTRAVENOUS
  Filled 2020-06-11: qty 10

## 2020-06-11 MED ORDER — FAMOTIDINE IN NACL 20-0.9 MG/50ML-% IV SOLN
INTRAVENOUS | Status: AC
  Filled 2020-06-11: qty 50

## 2020-06-11 MED ORDER — SODIUM CHLORIDE 0.9 % IV SOLN
Freq: Once | INTRAVENOUS | Status: AC
  Filled 2020-06-11: qty 250

## 2020-06-11 MED ORDER — CYANOCOBALAMIN 1000 MCG/ML IJ SOLN
1000.0000 ug | Freq: Once | INTRAMUSCULAR | Status: AC
  Administered 2020-06-11: 1000 ug via INTRAMUSCULAR

## 2020-06-11 MED ORDER — FAMOTIDINE IN NACL 20-0.9 MG/50ML-% IV SOLN
20.0000 mg | Freq: Once | INTRAVENOUS | Status: AC
  Administered 2020-06-11: 20 mg via INTRAVENOUS

## 2020-06-11 MED ORDER — PALONOSETRON HCL INJECTION 0.25 MG/5ML
0.2500 mg | Freq: Once | INTRAVENOUS | Status: AC
  Administered 2020-06-11: 0.25 mg via INTRAVENOUS

## 2020-06-11 MED ORDER — PALONOSETRON HCL INJECTION 0.25 MG/5ML
INTRAVENOUS | Status: AC
  Filled 2020-06-11: qty 5

## 2020-06-11 MED ORDER — DIPHENHYDRAMINE HCL 50 MG/ML IJ SOLN
INTRAMUSCULAR | Status: AC
  Filled 2020-06-11: qty 1

## 2020-06-11 MED ORDER — SODIUM CHLORIDE 0.9% FLUSH
10.0000 mL | INTRAVENOUS | Status: DC | PRN
  Administered 2020-06-11: 10 mL
  Filled 2020-06-11: qty 10

## 2020-06-11 MED ORDER — SODIUM CHLORIDE 0.9% FLUSH
10.0000 mL | Freq: Once | INTRAVENOUS | Status: AC
  Administered 2020-06-11: 10 mL
  Filled 2020-06-11: qty 10

## 2020-06-11 MED ORDER — CYANOCOBALAMIN 1000 MCG/ML IJ SOLN
INTRAMUSCULAR | Status: AC
  Filled 2020-06-11: qty 1

## 2020-06-11 MED ORDER — DIPHENHYDRAMINE HCL 50 MG/ML IJ SOLN
25.0000 mg | Freq: Once | INTRAMUSCULAR | Status: AC
  Administered 2020-06-11: 25 mg via INTRAVENOUS

## 2020-06-11 MED ORDER — SODIUM CHLORIDE 0.9 % IV SOLN
336.4000 mg | Freq: Once | INTRAVENOUS | Status: AC
  Administered 2020-06-11: 340 mg via INTRAVENOUS
  Filled 2020-06-11: qty 34

## 2020-06-11 NOTE — Assessment & Plan Note (Signed)
Her last Ct imaging showed stability except for some pelvic lymph node and changes near her bladder She is completely asymptomatic She has near complete resolution of ascites and her tumor marker has dropped to half We discussed the risk and benefits of continuing treatment and she is in agreement We will proceed with treatment as scheduled  Due to history of pancytopenia, she tolerated an interval of every 4 weeks better I will see her again in 4 weeks we plan to repeat CT imaging at the end of February

## 2020-06-11 NOTE — Assessment & Plan Note (Signed)
She will continue B12 injections with every visit

## 2020-06-11 NOTE — Assessment & Plan Note (Signed)
She have symptoms of dysuria I will order urinalysis and urine culture We will call her with test results If she has signs of urinary tract infection, we will call in antibiotics We will proceed with treatment without delay today

## 2020-06-11 NOTE — Progress Notes (Signed)
[facility] OFFICE PROGRESS NOTE  Patient Care Team: [name], DO as PCP - General (Family Medicine) [name], MD as Consulting Physician (Ophthalmology) [name], DPM as Consulting Physician (Podiatry) [name], [name], MD as Referring Physician (Optometry) [name], MD (Inactive) as Consulting Physician (Orthopedic Surgery) [name], RN as Oncology Nurse Navigator (Oncology)  ASSESSMENT & PLAN:  Uterine cancer [facility]) Her last Ct imaging showed stability except for some pelvic lymph node and changes near her bladder She is completely asymptomatic She has near complete resolution of ascites and her tumor marker has dropped to half We discussed the risk and benefits of continuing treatment and she is in agreement We will proceed with treatment as scheduled  Due to history of pancytopenia, she tolerated an interval of every 4 weeks better I will see her again in 4 weeks we plan to repeat CT imaging at the end of February  Anemia due to antineoplastic chemotherapy She has multifactorial anemia, anemia chronic kidney disease, 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 and vitamin B12 deficiency She will get vitamin B12 injection with each chemo appointment She has not needed blood transfusion support last month  CKD (chronic kidney disease), stage III ([place]) Her serum creatinine is satisfactory We discussed the importance of adequate hydration and risk factor modification I will adjust the dose of her chemotherapy accordingly  Abnormal thyroid function test 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 Urine Culture    Standing Status:   Future    Number of Occurrences:   1    Standing Expiration Date:   06/11/2021  . Comprehensive metabolic panel    Standing Status:   Standing    Number of Occurrences:   33    Standing Expiration Date:   06/11/2021  . CBC with Differential/Platelet    Standing Status:   Standing    Number of Occurrences:   22    Standing Expiration Date:   06/11/2021  . Urinalysis, Complete w Microscopic    Standing Status:   Future    Number of Occurrences:   1    Standing Expiration Date:   06/11/2021    All questions were answered. The patient knows to call the clinic with any problems, questions or concerns. The total time spent in the appointment was 30 minutes encounter with patients including review of chart and various tests results, discussions about plan of care and coordination of care plan   [name], MD 06/11/2020 1:55 PM  INTERVAL HISTORY: Please see below for problem oriented charting. She returns for treatment and follow-up She complains of recent dysuria No frequency or urgency The patient denies any recent signs or symptoms of bleeding such as spontaneous epistaxis, hematuria or hematochezia. 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 Ultrasound irregular hypoechoic mass 7 x 6 x 5 mm   01/06/2012 Initial Biopsy   Initial biopsy was benign . 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 Cervix, biopsy - HIGH GRADE CARCINOMA. - SEE COMMENT. 2. Endocervix, curettage - HIGH GRADE CARCINOMA. - SEE COMMENT. 3. Endometrium, biopsy - HIGH GRADE CARCINOMA. - SEE COMMENT. Microscopic Comment 1. - 3. The carcinoma in the three specimens is morphologically similar and has features suggestive of papillary serous carcinoma.   07/26/2018 Tumor Marker   Patient's tumor was tested for the following markers: CA-125 Results of the tumor marker test revealed 835   07/28/2018 Imaging   1. Several (at least 10) solid pulmonary nodules scattered throughout both lungs are new since 2012 chest CT, likely representing pulmonary metastases, largest 6 mm, below PET resolution. 2. Redemonstration of hypodense 2.5 cm segment 7 right liver lobe mass, indeterminate, suspicious for liver metastasis. 3. 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 4. Three-vessel coronary atherosclerosis.  Aortic Atherosclerosis (ICD10-I70.0).   08/03/2018 Cancer Staging   Staging form: Corpus Uteri - Carcinoma and Carcinosarcoma, AJCC 8th Edition - Clinical: FIGO Stage IVB (cT3b, cN2, cM1) - Signed by [name], MD on 08/03/2018   08/05/2018 Tumor Marker   Patient's tumor was tested for the following markers: CA-125 Results of the tumor marker test revealed 901    08/06/2018 Procedure   Placement of single lumen port a cath via right internal jugular vein. The catheter tip lies at the cavo-atrial junction. A power injectable port a cath was placed and is ready for immediate use.    08/11/2018 - 01/12/2019 Chemotherapy   The patient had carboplatin and taxol x 8 cycles   09/01/2018 Tumor Marker   Patient's tumor was tested for the following markers: CA-125 Results of the tumor marker test revealed 406   09/09/2018 Genetic Testing   The Common Hereditary Cancers Panel offered by [employer] includes sequencing and/or deletion duplication testing of the following 48 genes: APC, ATM, AXIN2, BARD1, BMPR1A, BRCA1, BRCA2, BRIP1, CDH1, CDKN2A (p14ARF), CDKN2A (p16INK4a), CKD4, CHEK2, CTNNA1, DICER1, EPCAM (Deletion/duplication testing only), GREM1 (promoter region deletion/duplication testing only), KIT, MEN1, MLH1, MSH2, MSH3, MSH6, MUTYH, NBN, NF1, NHTL1, PALB2, PDGFRA, PMS2, POLD1, POLE, PTEN, RAD50, RAD51C, RAD51D, RNF43, SDHB, SDHC, SDHD, SMAD4, SMARCA4. STK11, TP53, TSC1, TSC2, and VHL.  The following genes were evaluated for sequence changes only: SDHA and HOXB13 c.251G>A variant only.  Results: Negative, no pathogenic variants identified. The report date is 09/09/2018.     09/22/2018 Tumor Marker   Patient's tumor was tested for the following markers: CA-125 Results of  the tumor marker test revealed 110   10/12/2018 Tumor Marker   Patient's tumor was tested for the following markers: CA-125 Results of the tumor marker test revealed 55.3   10/12/2018 Imaging   1. Mild decrease in pulmonary metastases, liver metastases, and metastatic abdominal and pelvic lymphadenopathy. 2. Decreased size of complex cystic lesion in left presacral region. 3. No new or progressive metastatic disease identified. 4. Stable small right inguinal hernia containing small portion of urinary bladder.   11/03/2018 Tumor Marker   Patient's tumor was tested for the following markers:  CA-125 Results of the tumor marker test revealed 34.3    Genetic Testing   Patient has genetic testing done for HER2. Results revealed patient has the following: HER2 - negative from pathology from 07/26/18.   12/20/2018 Imaging   1. Possible new omental nodule. 2. Improving abdominal and pelvic retroperitoneal adenopathy. 3. Stable pulmonary and hepatic metastatic disease. 4. Left perirectal/presacral fluid collection has decreased in size. 5. Right inguinal hernia contains a small portion of the bladder, as before. 6. Aortic atherosclerosis (ICD10-170.0). Coronary artery calcification   12/21/2018 Tumor Marker   Patient's tumor was tested for the following markers: CA-125 Results of the tumor marker test revealed 15.6   01/15/2019 Imaging   Ct abdomen and pelvis 1. Small hiatal hernia with fluid in the distal esophagus, which may represent reflux or esophagitis. No other acute findings. 2. Right inguinal hernia contains anterolateral aspect of the urinary bladder, unchanged in appearance from prior. Mild edema within the hernia sac is similar. 3. Unchanged small low-density lesions in the liver. 4. Unchanged retroperitoneal and bilateral iliac lymph nodes. Small ventral abdominal omental nodule tentatively identified and unchanged from prior.   Aortic Atherosclerosis (ICD10-I70.0).   02/02/2019 Tumor Marker   Patient's tumor was tested for the following markers: CA-125 Results of the tumor marker test revealed 7.8   03/22/2019 Surgery   Preoperative diagnosis: History of incarcerated right inguinal hernia   Postop diagnosis: Incarcerated right inguinal hernia with preperitoneal fat without strangulation or obstruction   Procedure: Repair of right inguinal hernia with ultra Pro hernia system mesh   Surgeon: Erroll Luna, MD   03/31/2019 Tumor Marker   Patient's tumor was tested for the following markers: CA-`125 Results of the tumor marker test revealed 24.4.   05/25/2019  Tumor Marker   Patient's tumor was tested for the following markers: CA-125 Results of the tumor marker test revealed 287   05/25/2019 Imaging   New solid bilateral adnexal masses, consistent with metastatic disease.   New peritoneal soft tissue nodules in the anterior right pelvis, suspicious for peritoneal carcinomatosis. No evidence of ascites.   No significant change in shotty sub-cm abdominal retroperitoneal and bilateral iliac lymph nodes.   06/09/2019 - 01/17/2020 Chemotherapy   The patient had pembrolizumab for chemotherapy treatment.     06/09/2019 Tumor Marker   Patient's tumor was tested for the following markers: CA-125 Results of the tumor marker test revealed 468   07/26/2019 Tumor Marker   Patient's tumor was tested for the following markers: CA-125 Results of the tumor marker test revealed 282   08/17/2019 Tumor Marker   Patient's tumor was tested for the following markers: CA-125 Results of the tumor marker test revealed 190.   09/05/2019 Imaging   1. Subtle nodularity along the transverse mesocolon measuring 11 mm is concerning for peritoneal carcinomatosis. Also associated with perihepatic ascites and enlarging juxta diaphragmatic nodal disease.  Attention on follow-up. 2. Bilateral adnexal masses are similar  in size to the previous exam. 3. Left para-aortic lymph node is slightly increased in size from the previous exam, slight interval enlargement is suspicious based on location for nodal involvement. Scattered subcentimeter lymph nodes throughout the left and right pelvis are similar to the previous exam. 4. Calcified coronary artery disease. 5. Low-density lesions in the liver are unchanged. 6. Persistent endometrial thickening not well assessed on CT. 7. Stranding in the right inguinal region tracking towards the abdominal wall likely related to prior inguinal hernia repair is unchanged. Area of discrete nodularity in this location is no longer seen.   Aortic  Atherosclerosis (ICD10-I70.0).       09/07/2019 Tumor Marker   Patient's tumor was tested for the following markers: CA-125 Results of the tumor marker test revealed 214   11/08/2019 Imaging   1. Multiple mild-to-moderate severity thickened small bowel loops throughout the abdomen with associated peri intestinal inflammatory fat stranding. While this may represent an infectious or inflammatory enteritis, the presence of an underlying neoplastic process cannot be excluded. 2. Large amount of perihepatic fluid. 3. Stable bilateral adnexal masses, likely ovarian in origin. Correlation with pelvic ultrasound is recommended. 4. Small foci of parenchymal low attenuation within the right lobe of the liver which may represent small cysts or hemangiomas. 5. Stable left renal cyst. 6. Aortic atherosclerosis.   Aortic Atherosclerosis (ICD10-I70.0).     11/16/2019 Tumor Marker   Patient's tumor was tested for the following markers: CA-125 Results of the tumor marker test revealed 456   12/07/2019 Tumor Marker   Patient's tumor was tested for the following markers: CA-125. Results of the tumor marker test revealed 676   01/17/2020 Tumor Marker   Patient's tumor was tested for the following markers: CA-125 Results of the tumor marker test revealed 1333   01/26/2020 Imaging   Increased peritoneal carcinomatosis, mild ascites, and tiny right pleural effusion. Increased size of bilateral adnexal masses, consistent with metastatic disease.   New soft tissue density involving the right anterior cervix and right bladder base, consistent with metastatic disease.   Mildly increased abdominal and pelvic lymphadenopathy, consistent with metastatic disease.   Aortic Atherosclerosis (ICD10-I70.0).     02/15/2020 -  Chemotherapy   The patient had carboplatin for chemotherapy treatment.     02/15/2020 Tumor Marker   Patient's tumor was tested for the following markers: CA-125 Results of the tumor marker  test revealed 1784   03/07/2020 Tumor Marker   Patient's tumor was tested for the following markers: CA-125 Results of the tumor marker test revealed 1037   04/16/2020 Tumor Marker   Patient's tumor was tested for the following markers: CA-125 Results of the tumor marker test revealed 718   04/27/2020 Imaging   1. Overall, no significant change in the appearance of peritoneal carcinomatosis. 2. Minimal change in size of large bilateral adnexal masses. 3. Mild progression of abdominal, pelvic and inguinal adenopathy. 4. Tumor arising from the right anterior cervix is again noted and appears increased in size with signs of tumor invasion into the right side of the urinary bladder. There has also been increase in tumor arising from the left posterior cervix. 5. New subpleural nodularity along the right hemidiaphragm. 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 No pleural effusion noted at this time. 6. No significant interval change in the appearance of small, nonspecific, multifocal pulmonary nodules 7. Coronary artery calcifications noted. 8. Aortic atherosclerosis.     REVIEW OF SYSTEMS:   Constitutional: Denies fevers, chills or  abnormal weight loss Eyes: Denies blurriness of vision Ears, nose, mouth, throat, and face: Denies mucositis or sore throat Respiratory: Denies cough, dyspnea or wheezes Cardiovascular: Denies palpitation, chest discomfort or lower extremity swelling Gastrointestinal:  Denies nausea, heartburn or change in bowel habits Skin: Denies abnormal skin rashes Lymphatics: Denies new lymphadenopathy or easy bruising Neurological:Denies numbness, tingling or new weaknesses Behavioral/Psych: Mood is stable, no new changes  All other systems were reviewed with the patient and are negative.  I have reviewed the past medical history, past surgical history, social history and family history with the patient and they are unchanged from  previous note.  ALLERGIES:  is allergic to zofran [ondansetron hcl].  MEDICATIONS:  Current Outpatient Medications  Medication Sig Dispense Refill  . acetaminophen (TYLENOL) 500 MG tablet Take 1 tablet (500 mg total) by mouth every 6 (six) hours as needed. (Patient taking differently: Take 1,000 mg by mouth every 6 (six) hours as needed for moderate pain. ) 30 tablet 3  . Calcium Carbonate (CALTRATE 600 PO) Take 1 tablet by mouth 2 (two) times daily.     . enalapril (VASOTEC) 20 MG tablet TAKE 1 TABLET(20 MG) BY MOUTH DAILY 90 tablet 1  . Fluticasone-Salmeterol (ADVAIR DISKUS) 100-50 MCG/DOSE AEPB INHALE 1 PUFF BY MOUTH TWICE DAILY 60 each 2  . furosemide (LASIX) 40 MG tablet Take 1 tablet (40 mg total) by mouth daily as needed (leg swelling). 90 tablet 1  . levothyroxine (SYNTHROID) 125 MCG tablet Take 1 tablet (125 mcg total) by mouth daily before breakfast. 30 tablet 11  . lidocaine-prilocaine (EMLA) cream Apply to affected area once (Patient taking differently: Apply 1 application topically daily as needed. Apply to affected area once) 30 g 3  . Multiple Vitamin (MULTIVITAMIN) tablet Take 1 tablet by mouth daily.    . Multiple Vitamins-Minerals (ICAPS AREDS 2 PO) Take 1 capsule by mouth 2 (two) times daily.    . ondansetron (ZOFRAN) 8 MG tablet Take 1 tablet (8 mg total) by mouth every 8 (eight) hours as needed. Start on the third day after chemotherapy. 30 tablet 1  . oxyCODONE (OXY IR/ROXICODONE) 5 MG immediate release tablet Take 1 tablet (5 mg total) by mouth every 6 (six) hours as needed for severe pain. 60 tablet 0  . polyethylene glycol (MIRALAX / GLYCOLAX) 17 g packet Take 17 g by mouth daily as needed for moderate constipation.    . prochlorperazine (COMPAZINE) 10 MG tablet Take 1 tablet (10 mg total) by mouth every 6 (six) hours as needed (Nausea or vomiting). 60 tablet 1  . rivaroxaban (XARELTO) 20 MG TABS tablet Take 1 tablet (20 mg total) by mouth daily with supper. 90 tablet 3   . rosuvastatin (CRESTOR) 10 MG tablet Take 1 tablet (10 mg total) by mouth daily. Start with 59m for the first week. 90 tablet 0   No current facility-administered medications for this visit.    PHYSICAL EXAMINATION: ECOG PERFORMANCE STATUS: 2 - Symptomatic, <50% confined to bed  Vitals:   06/11/20 1256  BP: (!) 127/53  Pulse: 92  Resp: 18  Temp: 97.7 F (36.5 C)  SpO2: 100%   Filed Weights   06/11/20 1256  Weight: 207 lb (93.9 kg)    GENERAL:alert, no distress and comfortable SKIN: skin color, texture, turgor are normal, no rashes or significant lesions EYES: normal, Conjunctiva are pink and non-injected, sclera clear OROPHARYNX:no exudate, no erythema and lips, buccal mucosa, and tongue normal  NECK: supple, thyroid normal size, non-tender, without  nodularity LYMPH:  no palpable lymphadenopathy in the cervical, axillary or inguinal LUNGS: clear to auscultation and percussion with normal breathing effort HEART: regular rate & rhythm and no murmurs and no lower extremity edema ABDOMEN:abdomen soft, non-tender and normal bowel sounds Musculoskeletal:no cyanosis of digits and no clubbing  NEURO: alert & oriented x 3 with fluent speech, no focal motor/sensory deficits  LABORATORY DATA:  I have reviewed the data as listed    Component Value Date/Time   NA 138 05/30/2020 1324   NA 142 12/25/2014 1122   K 4.5 05/30/2020 1324   K 4.1 12/25/2014 1122   CL 104 05/30/2020 1324   CL 102 10/04/2012 1159   CO2 28 05/30/2020 1324   CO2 29 12/25/2014 1122   GLUCOSE 76 05/30/2020 1324   GLUCOSE 99 12/25/2014 1122   GLUCOSE 116 (H) 10/04/2012 1159   BUN 15 05/30/2020 1324   BUN 13.7 12/25/2014 1122   CREATININE 1.14 (H) 05/30/2020 1324   CREATININE 0.96 (H) 02/27/2016 1530   CREATININE 0.9 12/25/2014 1122   CALCIUM 10.0 05/30/2020 1324   CALCIUM 10.1 12/25/2014 1122   PROT 8.4 (H) 05/30/2020 1324   PROT 7.8 12/25/2014 1122   ALBUMIN 3.4 (L) 05/30/2020 1324   ALBUMIN 3.4  (L) 12/25/2014 1122   AST 28 05/30/2020 1324   AST 21 12/25/2014 1122   ALT 10 05/30/2020 1324   ALT 17 12/25/2014 1122   ALKPHOS 82 05/30/2020 1324   ALKPHOS 99 12/25/2014 1122   BILITOT 0.3 05/30/2020 1324   BILITOT 0.41 12/25/2014 1122   GFRNONAA 48 (L) 05/30/2020 1324   GFRNONAA 58 (L) 02/27/2016 1530   GFRAA >60 02/15/2020 0940   GFRAA 66 02/27/2016 1530    No results found for: SPEP, UPEP  Lab Results  Component Value Date   WBC 8.8 06/11/2020   NEUTROABS 6.3 06/11/2020   HGB 8.4 (L) 06/11/2020   HCT 26.3 (L) 06/11/2020   MCV 107.3 (H) 06/11/2020   PLT 194 06/11/2020      Chemistry      Component Value Date/Time   NA 138 05/30/2020 1324   NA 142 12/25/2014 1122   K 4.5 05/30/2020 1324   K 4.1 12/25/2014 1122   CL 104 05/30/2020 1324   CL 102 10/04/2012 1159   CO2 28 05/30/2020 1324   CO2 29 12/25/2014 1122   BUN 15 05/30/2020 1324   BUN 13.7 12/25/2014 1122   CREATININE 1.14 (H) 05/30/2020 1324   CREATININE 0.96 (H) 02/27/2016 1530   CREATININE 0.9 12/25/2014 1122      Component Value Date/Time   CALCIUM 10.0 05/30/2020 1324   CALCIUM 10.1 12/25/2014 1122   ALKPHOS 82 05/30/2020 1324   ALKPHOS 99 12/25/2014 1122   AST 28 05/30/2020 1324   AST 21 12/25/2014 1122   ALT 10 05/30/2020 1324   ALT 17 12/25/2014 1122   BILITOT 0.3 05/30/2020 1324   BILITOT 0.41 12/25/2014 1122

## 2020-06-11 NOTE — Assessment & Plan Note (Signed)
Her serum creatinine is satisfactory We discussed the importance of adequate hydration and risk factor modification I will adjust the dose of her chemotherapy accordingly 

## 2020-06-11 NOTE — Assessment & Plan Note (Signed)
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 

## 2020-06-11 NOTE — Assessment & Plan Note (Signed)
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

## 2020-06-12 ENCOUNTER — Telehealth: Payer: Self-pay

## 2020-06-12 LAB — URINE CULTURE

## 2020-06-12 NOTE — Telephone Encounter (Signed)
-----   Message from [name], MD sent at 06/12/2020  1:46 PM EST ----- Regarding: pls call and let her know urine culture is not suggestive of infection. drink more water, no need antibiotics

## 2020-06-12 NOTE — Telephone Encounter (Signed)
Called and given below message. She verbalized understanding. She will start drinking more water.

## 2020-07-05 ENCOUNTER — Other Ambulatory Visit: Payer: Self-pay | Admitting: Hematology and Oncology

## 2020-07-09 ENCOUNTER — Other Ambulatory Visit: Payer: Self-pay

## 2020-07-09 ENCOUNTER — Inpatient Hospital Stay: Payer: Medicare Other

## 2020-07-09 ENCOUNTER — Other Ambulatory Visit: Payer: Self-pay | Admitting: Hematology and Oncology

## 2020-07-09 ENCOUNTER — Encounter: Payer: Self-pay | Admitting: Hematology and Oncology

## 2020-07-09 ENCOUNTER — Inpatient Hospital Stay: Payer: Medicare Other | Attending: Gynecologic Oncology

## 2020-07-09 ENCOUNTER — Inpatient Hospital Stay (HOSPITAL_BASED_OUTPATIENT_CLINIC_OR_DEPARTMENT_OTHER): Payer: Medicare Other | Admitting: Hematology and Oncology

## 2020-07-09 VITALS — BP 124/63 | HR 94 | Temp 98.3°F | Resp 18 | Wt 206.8 lb

## 2020-07-09 DIAGNOSIS — N183 Chronic kidney disease, stage 3 unspecified: Secondary | ICD-10-CM | POA: Diagnosis not present

## 2020-07-09 DIAGNOSIS — D6481 Anemia due to antineoplastic chemotherapy: Secondary | ICD-10-CM | POA: Insufficient documentation

## 2020-07-09 DIAGNOSIS — D539 Nutritional anemia, unspecified: Secondary | ICD-10-CM

## 2020-07-09 DIAGNOSIS — C55 Malignant neoplasm of uterus, part unspecified: Secondary | ICD-10-CM

## 2020-07-09 DIAGNOSIS — Z7901 Long term (current) use of anticoagulants: Secondary | ICD-10-CM | POA: Insufficient documentation

## 2020-07-09 DIAGNOSIS — T451X5A Adverse effect of antineoplastic and immunosuppressive drugs, initial encounter: Secondary | ICD-10-CM

## 2020-07-09 DIAGNOSIS — G8929 Other chronic pain: Secondary | ICD-10-CM | POA: Diagnosis not present

## 2020-07-09 DIAGNOSIS — Z8672 Personal history of thrombophlebitis: Secondary | ICD-10-CM | POA: Diagnosis not present

## 2020-07-09 DIAGNOSIS — Z5111 Encounter for antineoplastic chemotherapy: Secondary | ICD-10-CM | POA: Insufficient documentation

## 2020-07-09 DIAGNOSIS — M25569 Pain in unspecified knee: Secondary | ICD-10-CM

## 2020-07-09 DIAGNOSIS — Z7189 Other specified counseling: Secondary | ICD-10-CM

## 2020-07-09 DIAGNOSIS — Z86718 Personal history of other venous thrombosis and embolism: Secondary | ICD-10-CM | POA: Diagnosis not present

## 2020-07-09 LAB — COMPREHENSIVE METABOLIC PANEL
ALT: 8 U/L (ref 0–44)
AST: 38 U/L (ref 15–41)
Albumin: 3.3 g/dL — ABNORMAL LOW (ref 3.5–5.0)
Alkaline Phosphatase: 77 U/L (ref 38–126)
Anion gap: 5 (ref 5–15)
BUN: 22 mg/dL (ref 8–23)
CO2: 25 mmol/L (ref 22–32)
Calcium: 9.7 mg/dL (ref 8.9–10.3)
Chloride: 106 mmol/L (ref 98–111)
Creatinine, Ser: 1.35 mg/dL — ABNORMAL HIGH (ref 0.44–1.00)
GFR, Estimated: 39 mL/min — ABNORMAL LOW (ref >60)
Glucose, Bld: 83 mg/dL (ref 70–99)
Potassium: 5 mmol/L (ref 3.5–5.1)
Sodium: 136 mmol/L (ref 135–145)
Total Bilirubin: <0.2 mg/dL — ABNORMAL LOW (ref 0.3–1.2)
Total Protein: 7.7 g/dL (ref 6.5–8.1)

## 2020-07-09 LAB — SAMPLE TO BLOOD BANK

## 2020-07-09 LAB — CBC WITH DIFFERENTIAL/PLATELET
Abs Immature Granulocytes: 0.02 10*3/uL (ref 0.00–0.07)
Basophils Absolute: 0 10*3/uL (ref 0.0–0.1)
Basophils Relative: 1 %
Eosinophils Absolute: 0.3 10*3/uL (ref 0.0–0.5)
Eosinophils Relative: 5 %
HCT: 25.8 % — ABNORMAL LOW (ref 36.0–46.0)
Hemoglobin: 8.2 g/dL — ABNORMAL LOW (ref 12.0–15.0)
Immature Granulocytes: 0 %
Lymphocytes Relative: 14 %
Lymphs Abs: 0.9 10*3/uL (ref 0.7–4.0)
MCH: 33.5 pg (ref 26.0–34.0)
MCHC: 31.8 g/dL (ref 30.0–36.0)
MCV: 105.3 fL — ABNORMAL HIGH (ref 80.0–100.0)
Monocytes Absolute: 1 10*3/uL (ref 0.1–1.0)
Monocytes Relative: 15 %
Neutro Abs: 4.3 10*3/uL (ref 1.7–7.7)
Neutrophils Relative %: 65 %
Platelets: 177 10*3/uL (ref 150–400)
RBC: 2.45 MIL/uL — ABNORMAL LOW (ref 3.87–5.11)
RDW: 13.8 % (ref 11.5–15.5)
WBC: 6.6 10*3/uL (ref 4.0–10.5)
nRBC: 0 % (ref 0.0–0.2)

## 2020-07-09 MED ORDER — PALONOSETRON HCL INJECTION 0.25 MG/5ML
0.2500 mg | Freq: Once | INTRAVENOUS | Status: AC
  Administered 2020-07-09: 0.25 mg via INTRAVENOUS

## 2020-07-09 MED ORDER — SODIUM CHLORIDE 0.9 % IV SOLN
10.0000 mg | Freq: Once | INTRAVENOUS | Status: AC
  Administered 2020-07-09: 10 mg via INTRAVENOUS
  Filled 2020-07-09: qty 10

## 2020-07-09 MED ORDER — PALONOSETRON HCL INJECTION 0.25 MG/5ML
INTRAVENOUS | Status: AC
  Filled 2020-07-09: qty 5

## 2020-07-09 MED ORDER — HEPARIN SOD (PORK) LOCK FLUSH 100 UNIT/ML IV SOLN
500.0000 [IU] | Freq: Once | INTRAVENOUS | Status: AC | PRN
  Administered 2020-07-09: 500 [IU]
  Filled 2020-07-09: qty 5

## 2020-07-09 MED ORDER — FAMOTIDINE IN NACL 20-0.9 MG/50ML-% IV SOLN
INTRAVENOUS | Status: AC
  Filled 2020-07-09: qty 50

## 2020-07-09 MED ORDER — SODIUM CHLORIDE 0.9% FLUSH
10.0000 mL | Freq: Once | INTRAVENOUS | Status: AC
  Administered 2020-07-09: 10 mL
  Filled 2020-07-09: qty 10

## 2020-07-09 MED ORDER — SODIUM CHLORIDE 0.9% FLUSH
10.0000 mL | INTRAVENOUS | Status: DC | PRN
  Administered 2020-07-09: 10 mL
  Filled 2020-07-09: qty 10

## 2020-07-09 MED ORDER — SODIUM CHLORIDE 0.9 % IV SOLN
290.0000 mg | Freq: Once | INTRAVENOUS | Status: AC
  Administered 2020-07-09: 290 mg via INTRAVENOUS
  Filled 2020-07-09: qty 29

## 2020-07-09 MED ORDER — SODIUM CHLORIDE 0.9 % IV SOLN
150.0000 mg | Freq: Once | INTRAVENOUS | Status: AC
  Administered 2020-07-09: 150 mg via INTRAVENOUS
  Filled 2020-07-09: qty 150

## 2020-07-09 MED ORDER — SODIUM CHLORIDE 0.9 % IV SOLN
Freq: Once | INTRAVENOUS | Status: AC
  Filled 2020-07-09: qty 250

## 2020-07-09 MED ORDER — FAMOTIDINE IN NACL 20-0.9 MG/50ML-% IV SOLN
20.0000 mg | Freq: Once | INTRAVENOUS | Status: AC
  Administered 2020-07-09: 20 mg via INTRAVENOUS

## 2020-07-09 MED ORDER — CYANOCOBALAMIN 1000 MCG/ML IJ SOLN
INTRAMUSCULAR | Status: AC
  Filled 2020-07-09: qty 1

## 2020-07-09 MED ORDER — OXYCODONE HCL 5 MG PO TABS: 5.0000 mg | ORAL_TABLET | Freq: Four times a day (QID) | ORAL | 0 refills | Status: AC | PRN

## 2020-07-09 MED ORDER — DIPHENHYDRAMINE HCL 50 MG/ML IJ SOLN
INTRAMUSCULAR | Status: AC
  Filled 2020-07-09: qty 1

## 2020-07-09 MED ORDER — CYANOCOBALAMIN 1000 MCG/ML IJ SOLN
1000.0000 ug | Freq: Once | INTRAMUSCULAR | Status: AC
  Administered 2020-07-09: 1000 ug via INTRAMUSCULAR

## 2020-07-09 MED ORDER — DIPHENHYDRAMINE HCL 50 MG/ML IJ SOLN
25.0000 mg | Freq: Once | INTRAMUSCULAR | Status: AC
  Administered 2020-07-09: 25 mg via INTRAVENOUS

## 2020-07-09 NOTE — Assessment & Plan Note (Signed)
She has multifactorial anemia, anemia chronic kidney disease, 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 and vitamin B12 deficiency She will get vitamin B12 injection with each chemo appointment She has not needed blood transfusion support last month She does not need blood transfusion today Observe closely for now

## 2020-07-09 NOTE — Assessment & Plan Note (Signed)
Her last Ct imaging showed stability except for some pelvic lymph node and changes near her bladder She is completely asymptomatic She has near complete resolution of ascites and her tumor marker has dropped to half We discussed the risk and benefits of continuing treatment and she is in agreement We will proceed with treatment as scheduled  Due to history of pancytopenia, she tolerated an interval of every 4 weeks in between treatment I plan to repeat CT imaging in 2 weeks for further follow-up

## 2020-07-09 NOTE — Progress Notes (Signed)
[place] OFFICE PROGRESS NOTE  Patient Care Team: [name], DO as PCP - General (Family Medicine) [name], MD as Consulting Physician (Ophthalmology) [name], DPM as Consulting Physician (Podiatry) [name], [name], MD as Referring Physician (Optometry) [name], MD (Inactive) as Consulting Physician (Orthopedic Surgery) [name], RN as Oncology Nurse Navigator (Oncology)  ASSESSMENT & PLAN:  Uterine cancer [facility]) Her last Ct imaging showed stability except for some pelvic lymph node and changes near her bladder She is completely asymptomatic She has near complete resolution of ascites and her tumor marker has dropped to half We discussed the risk and benefits of continuing treatment and she is in agreement We will proceed with treatment as scheduled  Due to history of pancytopenia, she tolerated an interval of every 4 weeks in between treatment I plan to repeat CT imaging in 2 weeks for further follow-up  Anemia due to antineoplastic chemotherapy She has multifactorial anemia, anemia chronic kidney disease, 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 and vitamin B12 deficiency She will get vitamin B12 injection with each chemo appointment She has not needed blood transfusion support last month She does not need blood transfusion today Observe closely for now  CKD (chronic kidney disease), stage III ([place]) She has significant fluctuation of serum creatinine I have to reduce her dose of chemotherapy today due to elevated creatinine We discussed the importance of adequate hydration and risk factor modification  Chronic knee pain She takes intermittent doses of oxycodone for knee pain and abdominal pain She denies severe constipation I refill her prescription of pain medicine  DEEP VENOUS THROMBOPHLEBITIS, HX OF She is taking chronic anticoagulation therapy She denies recent bleeding complications   Orders Placed This Encounter   Procedures  . CT CHEST ABDOMEN PELVIS W CONTRAST    Standing Status:   Future    Standing Expiration Date:   07/09/2021    Order Specific Question:   Preferred imaging location?    Answer:   [facility]    Order Specific Question:   Radiology Contrast Protocol - do NOT remove file path    Answer:   [email or web address]    All questions were answered. The patient knows to call the clinic with any problems, questions or concerns. The total time spent in the appointment was 30 minutes encounter with patients including review of chart and various tests results, discussions about plan of care and coordination of care plan   [name], MD 07/09/2020 2:36 PM  INTERVAL HISTORY: Please see below for problem oriented charting. She returns with her daughter for further follow-up The patient is over 1 hour late today She denies abdominal pain, nausea or changes in bowel habits The patient denies any recent signs or symptoms of bleeding such as spontaneous epistaxis, hematuria or hematochezia. 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 Ultrasound irregular hypoechoic mass 7 x 6 x 5 mm   01/06/2012 Initial Biopsy   Initial biopsy was benign . 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 Cervix, biopsy - HIGH GRADE CARCINOMA. - SEE COMMENT. 2. Endocervix, curettage - HIGH GRADE CARCINOMA. - SEE COMMENT. 3. Endometrium, biopsy - HIGH GRADE CARCINOMA. - SEE COMMENT. Microscopic Comment 1. - 3. The carcinoma in the three specimens is morphologically similar and has features suggestive of papillary serous carcinoma.   07/26/2018 Tumor Marker   Patient's tumor was tested for the following markers: CA-125 Results of the tumor marker test revealed 835   07/28/2018 Imaging   1. Several (at least 10) solid pulmonary nodules scattered throughout both lungs are new since 2012 chest CT, likely representing pulmonary metastases, largest 6 mm, below PET resolution. 2. Redemonstration of hypodense 2.5 cm segment 7 right liver lobe mass, indeterminate, suspicious for liver metastasis. 3. 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 4. Three-vessel coronary atherosclerosis.  Aortic Atherosclerosis (ICD10-I70.0).   08/03/2018 Cancer Staging   Staging form: Corpus Uteri - Carcinoma and Carcinosarcoma, AJCC 8th Edition - Clinical: FIGO Stage IVB (cT3b, cN2, cM1) - Signed by [name], MD on 08/03/2018   08/05/2018 Tumor Marker   Patient's tumor was tested for the following markers: CA-125 Results of the tumor marker test revealed 901   08/06/2018 Procedure   Placement of single lumen port a cath via right internal jugular vein. The catheter tip lies at the cavo-atrial junction. A power injectable port a cath was placed and is ready for immediate use.    08/11/2018 - 01/12/2019 Chemotherapy   The patient had carboplatin and taxol x 8  cycles   09/01/2018 Tumor Marker   Patient's tumor was tested for the following markers: CA-125 Results of the tumor marker test revealed 406   09/09/2018 Genetic Testing   The Common Hereditary Cancers Panel offered by [facility] includes sequencing and/or deletion duplication testing of the following 48 genes: APC, ATM, AXIN2, BARD1, BMPR1A, BRCA1, BRCA2, BRIP1, CDH1, CDKN2A (p14ARF), CDKN2A (p16INK4a), CKD4, CHEK2, CTNNA1, DICER1, EPCAM (Deletion/duplication testing only), GREM1 (promoter region deletion/duplication testing only), KIT, MEN1, MLH1, MSH2, MSH3, MSH6, MUTYH, NBN, NF1, NHTL1, PALB2, PDGFRA, PMS2, POLD1, POLE, PTEN, RAD50, RAD51C, RAD51D, RNF43, SDHB, SDHC, SDHD, SMAD4, SMARCA4. STK11, TP53, TSC1, TSC2, and VHL.  The following genes were evaluated for sequence changes only: SDHA and HOXB13 c.251G>A variant only.  Results: Negative, no pathogenic variants identified. The report date is 09/09/2018.     09/22/2018 Tumor Marker   Patient's tumor was tested for the following markers: CA-125 Results of the tumor marker test revealed 110   10/12/2018 Tumor Marker   Patient's tumor was tested for the following markers: CA-125 Results of the tumor marker test revealed 55.3   10/12/2018 Imaging   1. Mild decrease in pulmonary metastases, liver metastases, and metastatic abdominal and pelvic lymphadenopathy. 2. Decreased size of complex cystic lesion in left presacral region. 3. No new or progressive metastatic disease identified. 4. Stable small right inguinal hernia containing small portion of urinary bladder.  11/03/2018 Tumor Marker   Patient's tumor was tested for the following markers: CA-125 Results of the tumor marker test revealed 34.3    Genetic Testing   Patient has genetic testing done for HER2. Results revealed patient has the following: HER2 - negative from pathology from 07/26/18.   12/20/2018 Imaging   1. Possible new omental nodule. 2. Improving abdominal and pelvic  retroperitoneal adenopathy. 3. Stable pulmonary and hepatic metastatic disease. 4. Left perirectal/presacral fluid collection has decreased in size. 5. Right inguinal hernia contains a small portion of the bladder, as before. 6. Aortic atherosclerosis (ICD10-170.0). Coronary artery calcification   12/21/2018 Tumor Marker   Patient's tumor was tested for the following markers: CA-125 Results of the tumor marker test revealed 15.6   01/15/2019 Imaging   Ct abdomen and pelvis 1. Small hiatal hernia with fluid in the distal esophagus, which may represent reflux or esophagitis. No other acute findings. 2. Right inguinal hernia contains anterolateral aspect of the urinary bladder, unchanged in appearance from prior. Mild edema within the hernia sac is similar. 3. Unchanged small low-density lesions in the liver. 4. Unchanged retroperitoneal and bilateral iliac lymph nodes. Small ventral abdominal omental nodule tentatively identified and unchanged from prior.   Aortic Atherosclerosis (ICD10-I70.0).   02/02/2019 Tumor Marker   Patient's tumor was tested for the following markers: CA-125 Results of the tumor marker test revealed 7.8   03/22/2019 Surgery   Preoperative diagnosis: History of incarcerated right inguinal hernia   Postop diagnosis: Incarcerated right inguinal hernia with preperitoneal fat without strangulation or obstruction   Procedure: Repair of right inguinal hernia with ultra Pro hernia system mesh   Surgeon: Erroll Luna, MD   03/31/2019 Tumor Marker   Patient's tumor was tested for the following markers: CA-`125 Results of the tumor marker test revealed 24.4.   05/25/2019 Tumor Marker   Patient's tumor was tested for the following markers: CA-125 Results of the tumor marker test revealed 287   05/25/2019 Imaging   New solid bilateral adnexal masses, consistent with metastatic disease.   New peritoneal soft tissue nodules in the anterior right pelvis, suspicious for  peritoneal carcinomatosis. No evidence of ascites.   No significant change in shotty sub-cm abdominal retroperitoneal and bilateral iliac lymph nodes.   06/09/2019 - 01/17/2020 Chemotherapy   The patient had pembrolizumab for chemotherapy treatment.     06/09/2019 Tumor Marker   Patient's tumor was tested for the following markers: CA-125 Results of the tumor marker test revealed 468   07/26/2019 Tumor Marker   Patient's tumor was tested for the following markers: CA-125 Results of the tumor marker test revealed 282   08/17/2019 Tumor Marker   Patient's tumor was tested for the following markers: CA-125 Results of the tumor marker test revealed 190.   09/05/2019 Imaging   1. Subtle nodularity along the transverse mesocolon measuring 11 mm is concerning for peritoneal carcinomatosis. Also associated with perihepatic ascites and enlarging juxta diaphragmatic nodal disease.  Attention on follow-up. 2. Bilateral adnexal masses are similar in size to the previous exam. 3. Left para-aortic lymph node is slightly increased in size from the previous exam, slight interval enlargement is suspicious based on location for nodal involvement. Scattered subcentimeter lymph nodes throughout the left and right pelvis are similar to the previous exam. 4. Calcified coronary artery disease. 5. Low-density lesions in the liver are unchanged. 6. Persistent endometrial thickening not well assessed on CT. 7. Stranding in the right inguinal region tracking towards the abdominal wall likely related  to prior inguinal hernia repair is unchanged. Area of discrete nodularity in this location is no longer seen.   Aortic Atherosclerosis (ICD10-I70.0).       09/07/2019 Tumor Marker   Patient's tumor was tested for the following markers: CA-125 Results of the tumor marker test revealed 214   11/08/2019 Imaging   1. Multiple mild-to-moderate severity thickened small bowel loops throughout the abdomen with associated peri  intestinal inflammatory fat stranding. While this may represent an infectious or inflammatory enteritis, the presence of an underlying neoplastic process cannot be excluded. 2. Large amount of perihepatic fluid. 3. Stable bilateral adnexal masses, likely ovarian in origin. Correlation with pelvic ultrasound is recommended. 4. Small foci of parenchymal low attenuation within the right lobe of the liver which may represent small cysts or hemangiomas. 5. Stable left renal cyst. 6. Aortic atherosclerosis.   Aortic Atherosclerosis (ICD10-I70.0).     11/16/2019 Tumor Marker   Patient's tumor was tested for the following markers: CA-125 Results of the tumor marker test revealed 456   12/07/2019 Tumor Marker   Patient's tumor was tested for the following markers: CA-125. Results of the tumor marker test revealed 676   01/17/2020 Tumor Marker   Patient's tumor was tested for the following markers: CA-125 Results of the tumor marker test revealed 1333   01/26/2020 Imaging   Increased peritoneal carcinomatosis, mild ascites, and tiny right pleural effusion. Increased size of bilateral adnexal masses, consistent with metastatic disease.   New soft tissue density involving the right anterior cervix and right bladder base, consistent with metastatic disease.   Mildly increased abdominal and pelvic lymphadenopathy, consistent with metastatic disease.   Aortic Atherosclerosis (ICD10-I70.0).     02/15/2020 -  Chemotherapy   The patient had carboplatin for chemotherapy treatment.     02/15/2020 Tumor Marker   Patient's tumor was tested for the following markers: CA-125 Results of the tumor marker test revealed 1784   03/07/2020 Tumor Marker   Patient's tumor was tested for the following markers: CA-125 Results of the tumor marker test revealed 1037   04/16/2020 Tumor Marker   Patient's tumor was tested for the following markers: CA-125 Results of the tumor marker test revealed 718   04/27/2020  Imaging   1. Overall, no significant change in the appearance of peritoneal carcinomatosis. 2. Minimal change in size of large bilateral adnexal masses. 3. Mild progression of abdominal, pelvic and inguinal adenopathy. 4. Tumor arising from the right anterior cervix is again noted and appears increased in size with signs of tumor invasion into the right side of the urinary bladder. There has also been increase in tumor arising from the left posterior cervix. 5. New subpleural nodularity along the right hemidiaphragm. 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 No pleural effusion noted at this time. 6. No significant interval change in the appearance of small, nonspecific, multifocal pulmonary nodules 7. Coronary artery calcifications noted. 8. Aortic atherosclerosis.     REVIEW OF SYSTEMS:   Constitutional: Denies fevers, chills or abnormal weight loss Eyes: Denies blurriness of vision Ears, nose, mouth, throat, and face: Denies mucositis or sore throat Respiratory: Denies cough, dyspnea or wheezes Cardiovascular: Denies palpitation, chest discomfort or lower extremity swelling Gastrointestinal:  Denies nausea, heartburn or change in bowel habits Skin: Denies abnormal skin rashes Lymphatics: Denies new lymphadenopathy or easy bruising Neurological:Denies numbness, tingling or new weaknesses Behavioral/Psych: Mood is stable, no new changes  All other systems were reviewed with the patient and are negative.  I  have reviewed the past medical history, past surgical history, social history and family history with the patient and they are unchanged from previous note.  ALLERGIES:  is allergic to zofran [ondansetron hcl].  MEDICATIONS:  Current Outpatient Medications  Medication Sig Dispense Refill  . acetaminophen (TYLENOL) 500 MG tablet Take 1 tablet (500 mg total) by mouth every 6 (six) hours as needed. (Patient taking differently: Take 1,000 mg by  mouth every 6 (six) hours as needed for moderate pain. ) 30 tablet 3  . Calcium Carbonate (CALTRATE 600 PO) Take 1 tablet by mouth 2 (two) times daily.     . enalapril (VASOTEC) 20 MG tablet TAKE 1 TABLET(20 MG) BY MOUTH DAILY 90 tablet 1  . Fluticasone-Salmeterol (ADVAIR DISKUS) 100-50 MCG/DOSE AEPB INHALE 1 PUFF BY MOUTH TWICE DAILY 60 each 2  . furosemide (LASIX) 40 MG tablet Take 1 tablet (40 mg total) by mouth daily as needed (leg swelling). 90 tablet 1  . levothyroxine (SYNTHROID) 125 MCG tablet Take 1 tablet (125 mcg total) by mouth daily before breakfast. 30 tablet 11  . lidocaine-prilocaine (EMLA) cream Apply to affected area once (Patient taking differently: Apply 1 application topically daily as needed. Apply to affected area once) 30 g 3  . Multiple Vitamin (MULTIVITAMIN) tablet Take 1 tablet by mouth daily.    . Multiple Vitamins-Minerals (ICAPS AREDS 2 PO) Take 1 capsule by mouth 2 (two) times daily.    . ondansetron (ZOFRAN) 8 MG tablet Take 1 tablet (8 mg total) by mouth every 8 (eight) hours as needed. Start on the third day after chemotherapy. 30 tablet 1  . oxyCODONE (OXY IR/ROXICODONE) 5 MG immediate release tablet Take 1 tablet (5 mg total) by mouth every 6 (six) hours as needed for severe pain. 60 tablet 0  . polyethylene glycol (MIRALAX / GLYCOLAX) 17 g packet Take 17 g by mouth daily as needed for moderate constipation.    . prochlorperazine (COMPAZINE) 10 MG tablet Take 1 tablet (10 mg total) by mouth every 6 (six) hours as needed (Nausea or vomiting). 60 tablet 1  . rivaroxaban (XARELTO) 20 MG TABS tablet Take 1 tablet (20 mg total) by mouth daily with supper. 90 tablet 3  . rosuvastatin (CRESTOR) 10 MG tablet Take 1 tablet (10 mg total) by mouth daily. Start with 40m for the first week. 90 tablet 0   No current facility-administered medications for this visit.   Facility-Administered Medications Ordered in Other Visits  Medication Dose Route Frequency Provider Last Rate  Last Admin  . CARBOplatin (PARAPLATIN) 290 mg in sodium chloride 0.9 % 250 mL chemo infusion  290 mg Intravenous Once GAlvy Bimler Dorotha Hirschi, MD      . dexamethasone (DECADRON) 10 mg in sodium chloride 0.9 % 50 mL IVPB  10 mg Intravenous Once GAlvy Bimler Vandella Ord, MD      . famotidine (PEPCID) IVPB 20 mg premix  20 mg Intravenous Once GAlvy Bimler Javarius Tsosie, MD 200 mL/hr at 07/09/20 1427 20 mg at 07/09/20 1427  . fosaprepitant (EMEND) 150 mg in sodium chloride 0.9 % 145 mL IVPB  150 mg Intravenous Once Jaylina Ramdass, MD      . heparin lock flush 100 unit/mL  500 Units Intracatheter Once PRN GAlvy Bimler Anahita Cua, MD      . sodium chloride flush (NS) 0.9 % injection 10 mL  10 mL Intracatheter PRN GAlvy Bimler Madalaine Portier, MD        PHYSICAL EXAMINATION: ECOG PERFORMANCE STATUS: 2 - Symptomatic, <50% confined to bed  Vitals:  07/09/20 1332  BP: 124/63  Pulse: 94  Resp: 18  Temp: 98.3 F (36.8 C)  SpO2: 100%   Filed Weights   07/09/20 1332  Weight: 206 lb 12.8 oz (93.8 kg)    GENERAL:alert, no distress and comfortable SKIN: skin color, texture, turgor are normal, no rashes or significant lesions EYES: normal, Conjunctiva are pink and non-injected, sclera clear OROPHARYNX:no exudate, no erythema and lips, buccal mucosa, and tongue normal  NECK: supple, thyroid normal size, non-tender, without nodularity LYMPH:  no palpable lymphadenopathy in the cervical, axillary or inguinal LUNGS: clear to auscultation and percussion with normal breathing effort HEART: regular rate & rhythm and no murmurs with mild lower extremity edema ABDOMEN:abdomen soft, non-tender and normal bowel sounds Musculoskeletal:no cyanosis of digits and no clubbing  NEURO: alert & oriented x 3 with fluent speech, no focal motor/sensory deficits  LABORATORY DATA:  I have reviewed the data as listed    Component Value Date/Time   NA 136 07/09/2020 1321   NA 142 12/25/2014 1122   K 5.0 07/09/2020 1321   K 4.1 12/25/2014 1122   CL 106 07/09/2020 1321   CL 102  10/04/2012 1159   CO2 25 07/09/2020 1321   CO2 29 12/25/2014 1122   GLUCOSE 83 07/09/2020 1321   GLUCOSE 99 12/25/2014 1122   GLUCOSE 116 (H) 10/04/2012 1159   BUN 22 07/09/2020 1321   BUN 13.7 12/25/2014 1122   CREATININE 1.35 (H) 07/09/2020 1321   CREATININE 1.14 (H) 05/30/2020 1324   CREATININE 0.96 (H) 02/27/2016 1530   CREATININE 0.9 12/25/2014 1122   CALCIUM 9.7 07/09/2020 1321   CALCIUM 10.1 12/25/2014 1122   PROT 7.7 07/09/2020 1321   PROT 7.8 12/25/2014 1122   ALBUMIN 3.3 (L) 07/09/2020 1321   ALBUMIN 3.4 (L) 12/25/2014 1122   AST 38 07/09/2020 1321   AST 28 05/30/2020 1324   AST 21 12/25/2014 1122   ALT 8 07/09/2020 1321   ALT 10 05/30/2020 1324   ALT 17 12/25/2014 1122   ALKPHOS 77 07/09/2020 1321   ALKPHOS 99 12/25/2014 1122   BILITOT <0.2 (L) 07/09/2020 1321   BILITOT 0.3 05/30/2020 1324   BILITOT 0.41 12/25/2014 1122   GFRNONAA 39 (L) 07/09/2020 1321   GFRNONAA 48 (L) 05/30/2020 1324   GFRNONAA 58 (L) 02/27/2016 1530   GFRAA >60 02/15/2020 0940   GFRAA 66 02/27/2016 1530    No results found for: SPEP, UPEP  Lab Results  Component Value Date   WBC 6.6 07/09/2020   NEUTROABS 4.3 07/09/2020   HGB 8.2 (L) 07/09/2020   HCT 25.8 (L) 07/09/2020   MCV 105.3 (H) 07/09/2020   PLT 177 07/09/2020      Chemistry      Component Value Date/Time   NA 136 07/09/2020 1321   NA 142 12/25/2014 1122   K 5.0 07/09/2020 1321   K 4.1 12/25/2014 1122   CL 106 07/09/2020 1321   CL 102 10/04/2012 1159   CO2 25 07/09/2020 1321   CO2 29 12/25/2014 1122   BUN 22 07/09/2020 1321   BUN 13.7 12/25/2014 1122   CREATININE 1.35 (H) 07/09/2020 1321   CREATININE 1.14 (H) 05/30/2020 1324   CREATININE 0.96 (H) 02/27/2016 1530   CREATININE 0.9 12/25/2014 1122      Component Value Date/Time   CALCIUM 9.7 07/09/2020 1321   CALCIUM 10.1 12/25/2014 1122   ALKPHOS 77 07/09/2020 1321   ALKPHOS 99 12/25/2014 1122   AST 38 07/09/2020 1321   AST  28 05/30/2020 1324   AST 21  12/25/2014 1122   ALT 8 07/09/2020 1321   ALT 10 05/30/2020 1324   ALT 17 12/25/2014 1122   BILITOT <0.2 (L) 07/09/2020 1321   BILITOT 0.3 05/30/2020 1324   BILITOT 0.41 12/25/2014 1122

## 2020-07-09 NOTE — Assessment & Plan Note (Signed)
She takes intermittent doses of oxycodone for knee pain and abdominal pain She denies severe constipation I refill her prescription of pain medicine

## 2020-07-09 NOTE — Patient Instructions (Signed)
[facility] Discharge Instructions for Patients Receiving Chemotherapy  Today you received the following chemotherapy agents: Carboplatin.  To help prevent nausea and vomiting after your treatment, we encourage you to take your nausea medication as prescribed.   If you develop nausea and vomiting that is not controlled by your nausea medication, call the clinic.   BELOW ARE SYMPTOMS THAT SHOULD BE REPORTED IMMEDIATELY:  *FEVER GREATER THAN 100.5 F  *CHILLS WITH OR WITHOUT FEVER  NAUSEA AND VOMITING THAT IS NOT CONTROLLED WITH YOUR NAUSEA MEDICATION  *UNUSUAL SHORTNESS OF BREATH  *UNUSUAL BRUISING OR BLEEDING  TENDERNESS IN MOUTH AND THROAT WITH OR WITHOUT PRESENCE OF ULCERS  *URINARY PROBLEMS  *BOWEL PROBLEMS  UNUSUAL RASH Items with * indicate a potential emergency and should be followed up as soon as possible.  Feel free to call the clinic should you have any questions or concerns. The clinic phone number is [phone].  Please show the CHEMO ALERT CARD at check-in to the Emergency Department and triage nurse.     

## 2020-07-09 NOTE — Assessment & Plan Note (Signed)
She has significant fluctuation of serum creatinine I have to reduce her dose of chemotherapy today due to elevated creatinine We discussed the importance of adequate hydration and risk factor modification

## 2020-07-09 NOTE — Assessment & Plan Note (Signed)
She is taking chronic anticoagulation therapy She denies recent bleeding complications

## 2020-07-23 ENCOUNTER — Other Ambulatory Visit: Payer: Self-pay

## 2020-07-23 ENCOUNTER — Ambulatory Visit (HOSPITAL_COMMUNITY)
Admission: RE | Admit: 2020-07-23 | Discharge: 2020-07-23 | Disposition: A | Discharge status: Home / Self Care | Payer: Medicare Other | Source: Ambulatory Visit | Attending: Hematology and Oncology | Admitting: Hematology and Oncology

## 2020-07-23 ENCOUNTER — Encounter (HOSPITAL_COMMUNITY): Payer: Self-pay

## 2020-07-23 DIAGNOSIS — I2510 Atherosclerotic heart disease of native coronary artery without angina pectoris: Secondary | ICD-10-CM | POA: Diagnosis not present

## 2020-07-23 DIAGNOSIS — C55 Malignant neoplasm of uterus, part unspecified: Secondary | ICD-10-CM

## 2020-07-23 DIAGNOSIS — K6389 Other specified diseases of intestine: Secondary | ICD-10-CM | POA: Diagnosis not present

## 2020-07-23 DIAGNOSIS — I700 Atherosclerosis of aorta: Secondary | ICD-10-CM | POA: Diagnosis not present

## 2020-07-23 DIAGNOSIS — Z853 Personal history of malignant neoplasm of breast: Secondary | ICD-10-CM | POA: Diagnosis not present

## 2020-07-23 MED ORDER — IOHEXOL 300 MG/ML  SOLN
75.0000 mL | Freq: Once | INTRAMUSCULAR | Status: AC | PRN
  Administered 2020-07-23: 75 mL via INTRAVENOUS

## 2020-07-23 MED ORDER — HEPARIN SOD (PORK) LOCK FLUSH 100 UNIT/ML IV SOLN
INTRAVENOUS | Status: AC
  Filled 2020-07-23: qty 5

## 2020-07-23 MED ORDER — HEPARIN SOD (PORK) LOCK FLUSH 100 UNIT/ML IV SOLN
500.0000 [IU] | Freq: Once | INTRAVENOUS | Status: AC
  Administered 2020-07-23: 500 [IU] via INTRAVENOUS

## 2020-07-24 ENCOUNTER — Other Ambulatory Visit: Payer: Self-pay

## 2020-07-24 ENCOUNTER — Telehealth: Payer: Self-pay

## 2020-07-24 ENCOUNTER — Encounter: Payer: Self-pay | Admitting: Hematology and Oncology

## 2020-07-24 ENCOUNTER — Inpatient Hospital Stay (HOSPITAL_BASED_OUTPATIENT_CLINIC_OR_DEPARTMENT_OTHER): Payer: Medicare Other | Admitting: Hematology and Oncology

## 2020-07-24 DIAGNOSIS — C55 Malignant neoplasm of uterus, part unspecified: Secondary | ICD-10-CM

## 2020-07-24 DIAGNOSIS — Z7189 Other specified counseling: Secondary | ICD-10-CM | POA: Diagnosis not present

## 2020-07-24 DIAGNOSIS — Z8672 Personal history of thrombophlebitis: Secondary | ICD-10-CM | POA: Diagnosis not present

## 2020-07-24 NOTE — Assessment & Plan Note (Signed)
She is taking Xarelto for secondary prevention The patient has intermittent bleeding I recommend discontinuation of Xarelto once her current prescription runs out because the risk of bleeding outweighs the benefit

## 2020-07-24 NOTE — Telephone Encounter (Signed)
RN contacted [facility].  New referral initiated for hospice care.

## 2020-07-24 NOTE — Progress Notes (Signed)
[place] OFFICE PROGRESS NOTE  Patient Care Team: [name], DO as PCP - General (Family Medicine) [name], MD as Consulting Physician (Ophthalmology) [name], DPM as Consulting Physician (Podiatry) [name], [name], MD as Referring Physician (Optometry) [name], MD (Inactive) as Consulting Physician (Orthopedic Surgery) [name], RN as Oncology Nurse Navigator (Oncology)  ASSESSMENT & PLAN:  Uterine cancer [facility]) I have reviewed multiple imaging studies with the patient and her daughter Unfortunately, the patient has significant disease progression She has progressed on carboplatin, paclitaxel, pembrolizumab and Lenvima At this point in time, she is quite frail with significant pancytopenia Frankly, I do not recommend further chemotherapy as her quality of life is poor and future chemotherapy will likely bring less than 20% benefits, but with significant high risk of complications After much discussion, she is in agreement for referral for palliative care with hospice  Goals of care, counseling/discussion We have extensive discussions about goals of care The patient is aware of poor prognosis We discussed CODE STATUS and she agreed for DNR I gave her a DNR order I recommend referral for home-based palliative care with hospice  DEEP VENOUS THROMBOPHLEBITIS, HX OF She is taking Xarelto for secondary prevention The patient has intermittent bleeding I recommend discontinuation of Xarelto once her current prescription runs out because the risk of bleeding outweighs the benefit   No orders of the defined types were placed in this encounter.   All questions were answered. The patient knows to call the clinic with any problems, questions or concerns. The total time spent in the appointment was 40 minutes encounter with patients including review of chart and various tests results, discussions about plan of care and coordination of  care plan   [name], MD 07/24/2020 2:58 PM  INTERVAL HISTORY: Please see below for problem oriented charting. 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 Ultrasound irregular hypoechoic mass 7 x 6 x 5 mm   01/06/2012 Initial Biopsy   Initial biopsy was benign . 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 Cervix, biopsy - HIGH GRADE CARCINOMA. - SEE COMMENT. 2. Endocervix, curettage - HIGH GRADE CARCINOMA. - SEE COMMENT. 3. Endometrium, biopsy - HIGH GRADE CARCINOMA. - SEE COMMENT. Microscopic Comment 1. - 3. The carcinoma in the three specimens is morphologically similar and has features suggestive of papillary  serous carcinoma.   07/26/2018 Tumor Marker   Patient's tumor was tested for the following markers: CA-125 Results of the tumor marker test revealed  835   07/28/2018 Imaging   1. Several (at least 10) solid pulmonary nodules scattered throughout both lungs are new since 2012 chest CT, likely representing pulmonary metastases, largest 6 mm, below PET resolution. 2. Redemonstration of hypodense 2.5 cm segment 7 right liver lobe mass, indeterminate, suspicious for liver metastasis. 3. 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 4. Three-vessel coronary atherosclerosis.  Aortic Atherosclerosis (ICD10-I70.0).   08/03/2018 Cancer Staging   Staging form: Corpus Uteri - Carcinoma and Carcinosarcoma, AJCC 8th Edition - Clinical: FIGO Stage IVB (cT3b, cN2, cM1) - Signed by [name], MD on 08/03/2018   08/05/2018 Tumor Marker   Patient's tumor was tested for the following markers: CA-125 Results of the tumor marker test revealed 901   08/06/2018 Procedure   Placement of single lumen port a cath via right internal jugular vein. The catheter tip lies at the cavo-atrial junction. A power injectable port a cath was placed and is ready for immediate use.    08/11/2018 - 01/12/2019 Chemotherapy   The patient had carboplatin and taxol x 8 cycles   09/01/2018 Tumor Marker   Patient's tumor was tested for the following markers: CA-125 Results of the tumor marker test revealed 406   09/09/2018 Genetic Testing   The Common Hereditary Cancers Panel offered by [employer] includes sequencing and/or deletion duplication testing of the following 48 genes: APC, ATM, AXIN2, BARD1, BMPR1A, BRCA1, BRCA2, BRIP1, CDH1, CDKN2A (p14ARF), CDKN2A (p16INK4a), CKD4, CHEK2, CTNNA1, DICER1, EPCAM (Deletion/duplication testing only), GREM1 (promoter region deletion/duplication testing only), KIT, MEN1, MLH1, MSH2, MSH3, MSH6, MUTYH, NBN, NF1, NHTL1, PALB2, PDGFRA, PMS2, POLD1, POLE, PTEN, RAD50, RAD51C, RAD51D, RNF43, SDHB, SDHC, SDHD, SMAD4, SMARCA4. STK11, TP53, TSC1, TSC2, and VHL.  The following genes were evaluated for sequence changes only: SDHA and HOXB13  c.251G>A variant only.  Results: Negative, no pathogenic variants identified. The report date is 09/09/2018.     09/22/2018 Tumor Marker   Patient's tumor was tested for the following markers: CA-125 Results of the tumor marker test revealed 110   10/12/2018 Tumor Marker   Patient's tumor was tested for the following markers: CA-125 Results of the tumor marker test revealed 55.3   10/12/2018 Imaging   1. Mild decrease in pulmonary metastases, liver metastases, and metastatic abdominal and pelvic lymphadenopathy. 2. Decreased size of complex cystic lesion in left presacral region. 3. No new or progressive metastatic disease identified. 4. Stable small right inguinal hernia containing small portion of urinary bladder.   11/03/2018 Tumor Marker   Patient's tumor was tested for the following markers: CA-125 Results of the tumor marker test revealed 34.3    Genetic Testing   Patient has genetic testing done for HER2. Results revealed patient has the following: HER2 - negative from pathology from 07/26/18.   12/20/2018 Imaging   1. Possible new omental nodule. 2. Improving abdominal and pelvic retroperitoneal adenopathy. 3. Stable pulmonary and hepatic metastatic disease. 4. Left perirectal/presacral fluid collection has decreased in size. 5. Right inguinal hernia contains a small portion of the bladder, as before. 6. Aortic atherosclerosis (ICD10-170.0). Coronary artery calcification   12/21/2018 Tumor Marker   Patient's tumor was tested for the following markers: CA-125 Results of the tumor marker test revealed 15.6   01/15/2019 Imaging   Ct abdomen and pelvis 1. Small hiatal hernia with fluid in the distal esophagus, which may represent  reflux or esophagitis. No other acute findings. 2. Right inguinal hernia contains anterolateral aspect of the urinary bladder, unchanged in appearance from prior. Mild edema within the hernia sac is similar. 3. Unchanged small low-density lesions in the  liver. 4. Unchanged retroperitoneal and bilateral iliac lymph nodes. Small ventral abdominal omental nodule tentatively identified and unchanged from prior.   Aortic Atherosclerosis (ICD10-I70.0).   02/02/2019 Tumor Marker   Patient's tumor was tested for the following markers: CA-125 Results of the tumor marker test revealed 7.8   03/22/2019 Surgery   Preoperative diagnosis: History of incarcerated right inguinal hernia   Postop diagnosis: Incarcerated right inguinal hernia with preperitoneal fat without strangulation or obstruction   Procedure: Repair of right inguinal hernia with ultra Pro hernia system mesh   Surgeon: Erroll Luna, MD   03/31/2019 Tumor Marker   Patient's tumor was tested for the following markers: CA-`125 Results of the tumor marker test revealed 24.4.   05/25/2019 Tumor Marker   Patient's tumor was tested for the following markers: CA-125 Results of the tumor marker test revealed 287   05/25/2019 Imaging   New solid bilateral adnexal masses, consistent with metastatic disease.   New peritoneal soft tissue nodules in the anterior right pelvis, suspicious for peritoneal carcinomatosis. No evidence of ascites.   No significant change in shotty sub-cm abdominal retroperitoneal and bilateral iliac lymph nodes.   06/09/2019 - 01/17/2020 Chemotherapy   The patient had pembrolizumab for chemotherapy treatment.     06/09/2019 Tumor Marker   Patient's tumor was tested for the following markers: CA-125 Results of the tumor marker test revealed 468   07/26/2019 Tumor Marker   Patient's tumor was tested for the following markers: CA-125 Results of the tumor marker test revealed 282   08/17/2019 Tumor Marker   Patient's tumor was tested for the following markers: CA-125 Results of the tumor marker test revealed 190.   09/05/2019 Imaging   1. Subtle nodularity along the transverse mesocolon measuring 11 mm is concerning for peritoneal carcinomatosis. Also associated  with perihepatic ascites and enlarging juxta diaphragmatic nodal disease.  Attention on follow-up. 2. Bilateral adnexal masses are similar in size to the previous exam. 3. Left para-aortic lymph node is slightly increased in size from the previous exam, slight interval enlargement is suspicious based on location for nodal involvement. Scattered subcentimeter lymph nodes throughout the left and right pelvis are similar to the previous exam. 4. Calcified coronary artery disease. 5. Low-density lesions in the liver are unchanged. 6. Persistent endometrial thickening not well assessed on CT. 7. Stranding in the right inguinal region tracking towards the abdominal wall likely related to prior inguinal hernia repair is unchanged. Area of discrete nodularity in this location is no longer seen.   Aortic Atherosclerosis (ICD10-I70.0).       09/07/2019 Tumor Marker   Patient's tumor was tested for the following markers: CA-125 Results of the tumor marker test revealed 214   11/08/2019 Imaging   1. Multiple mild-to-moderate severity thickened small bowel loops throughout the abdomen with associated peri intestinal inflammatory fat stranding. While this may represent an infectious or inflammatory enteritis, the presence of an underlying neoplastic process cannot be excluded. 2. Large amount of perihepatic fluid. 3. Stable bilateral adnexal masses, likely ovarian in origin. Correlation with pelvic ultrasound is recommended. 4. Small foci of parenchymal low attenuation within the right lobe of the liver which may represent small cysts or hemangiomas. 5. Stable left renal cyst. 6. Aortic atherosclerosis.   Aortic Atherosclerosis (ICD10-I70.0).  11/16/2019 Tumor Marker   Patient's tumor was tested for the following markers: CA-125 Results of the tumor marker test revealed 456   12/07/2019 Tumor Marker   Patient's tumor was tested for the following markers: CA-125. Results of the tumor marker test  revealed 676   01/17/2020 Tumor Marker   Patient's tumor was tested for the following markers: CA-125 Results of the tumor marker test revealed 1333   01/26/2020 Imaging   Increased peritoneal carcinomatosis, mild ascites, and tiny right pleural effusion. Increased size of bilateral adnexal masses, consistent with metastatic disease.   New soft tissue density involving the right anterior cervix and right bladder base, consistent with metastatic disease.   Mildly increased abdominal and pelvic lymphadenopathy, consistent with metastatic disease.   Aortic Atherosclerosis (ICD10-I70.0).     02/15/2020 - 07/09/2020 Chemotherapy   The patient had carboplatin for chemotherapy treatment.     02/15/2020 Tumor Marker   Patient's tumor was tested for the following markers: CA-125 Results of the tumor marker test revealed 1784   03/07/2020 Tumor Marker   Patient's tumor was tested for the following markers: CA-125 Results of the tumor marker test revealed 1037   04/16/2020 Tumor Marker   Patient's tumor was tested for the following markers: CA-125 Results of the tumor marker test revealed 718   04/27/2020 Imaging   1. Overall, no significant change in the appearance of peritoneal carcinomatosis. 2. Minimal change in size of large bilateral adnexal masses. 3. Mild progression of abdominal, pelvic and inguinal adenopathy. 4. Tumor arising from the right anterior cervix is again noted and appears increased in size with signs of tumor invasion into the right side of the urinary bladder. There has also been increase in tumor arising from the left posterior cervix. 5. New subpleural nodularity along the right hemidiaphragm. 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 No pleural effusion noted at this time. 6. No significant interval change in the appearance of small, nonspecific, multifocal pulmonary nodules 7. Coronary artery calcifications noted. 8. Aortic  atherosclerosis.   07/24/2020 Imaging   1. Today's study demonstrates progression of disease, with enlargement of masses emanating from the lower uterine and cervix, with increasing direct extension involving adjacent structures including the urinary bladder and probable early involvement of the rectal wall. Large bilateral adnexal soft tissue masses have also increased in size. 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 2. Nonspecific nodularity in the lungs is stable compared to prior studies, favored to be benign, however, continued attention on follow-up studies is recommended. 3. Trace volume of ascites most evident adjacent to the liver, stable compared to the prior study. 4. Aortic atherosclerosis, in addition to left main and 3 vessel coronary artery disease. 5. Additional incidental findings, as above.       REVIEW OF SYSTEMS:   Constitutional: Denies fevers, chills or abnormal weight loss Eyes: Denies blurriness of vision Ears, nose, mouth, throat, and face: Denies mucositis or sore throat Respiratory: Denies cough, dyspnea or wheezes Cardiovascular: Denies palpitation, chest discomfort or lower extremity swelling Gastrointestinal:  Denies nausea, heartburn or change in bowel habits Skin: Denies abnormal skin rashes Lymphatics: Denies new lymphadenopathy or easy bruising Neurological:Denies numbness, tingling or new weaknesses Behavioral/Psych: Mood is stable, no new changes  All other systems were reviewed with the patient and are negative.  I have reviewed the  past medical history, past surgical history, social history and family history with the patient and they are unchanged from previous note.  ALLERGIES:  is allergic to zofran [ondansetron hcl].  MEDICATIONS:   Current Outpatient Medications  Medication Sig Dispense Refill  . acetaminophen (TYLENOL) 500 MG tablet Take 1 tablet (500 mg total) by mouth every 6 (six) hours as needed. (Patient taking differently: Take 1,000 mg by mouth every 6 (six) hours as needed for moderate pain. ) 30 tablet 3  . Calcium Carbonate (CALTRATE 600 PO) Take 1 tablet by mouth 2 (two) times daily.     . enalapril (VASOTEC) 20 MG tablet TAKE 1 TABLET(20 MG) BY MOUTH DAILY 90 tablet 1  . Fluticasone-Salmeterol (ADVAIR DISKUS) 100-50 MCG/DOSE AEPB INHALE 1 PUFF BY MOUTH TWICE DAILY 60 each 2  . furosemide (LASIX) 40 MG tablet Take 1 tablet (40 mg total) by mouth daily as needed (leg swelling). 90 tablet 1  . levothyroxine (SYNTHROID) 125 MCG tablet Take 1 tablet (125 mcg total) by mouth daily before breakfast. 30 tablet 11  . lidocaine-prilocaine (EMLA) cream Apply to affected area once (Patient taking differently: Apply 1 application topically daily as needed. Apply to affected area once) 30 g 3  . Multiple Vitamin (MULTIVITAMIN) tablet Take 1 tablet by mouth daily.    . Multiple Vitamins-Minerals (ICAPS AREDS 2 PO) Take 1 capsule by mouth 2 (two) times daily.    . ondansetron (ZOFRAN) 8 MG tablet Take 1 tablet (8 mg total) by mouth every 8 (eight) hours as needed. Start on the third day after chemotherapy. 30 tablet 1  . oxyCODONE (OXY IR/ROXICODONE) 5 MG immediate release tablet Take 1 tablet (5 mg total) by mouth every 6 (six) hours as needed for severe pain. 60 tablet 0  . polyethylene glycol (MIRALAX / GLYCOLAX) 17 g packet Take 17 g by mouth daily as needed for moderate constipation.    . prochlorperazine (COMPAZINE) 10 MG tablet Take 1 tablet (10 mg total) by mouth every 6 (six) hours as needed (Nausea or vomiting). 60 tablet 1  . rivaroxaban (XARELTO) 20 MG TABS tablet Take 1 tablet (20 mg total) by mouth daily with supper. 90 tablet 3  . rosuvastatin (CRESTOR) 10 MG tablet Take 1 tablet (10 mg total) by mouth daily.  Start with 30m for the first week. 90 tablet 0   No current facility-administered medications for this visit.    PHYSICAL EXAMINATION: ECOG PERFORMANCE STATUS: 2 - Symptomatic, <50% confined to bed  Vitals:   07/24/20 1222  BP: 136/60  Pulse: (!) 103  Resp: 15  Temp: 98.2 F (36.8 C)  SpO2: 99%   Filed Weights   07/24/20 1222  Weight: 204 lb 6.4 oz (92.7 kg)    GENERAL:alert, no distress and comfortable NEURO: alert & oriented x 3 with fluent speech, no focal motor/sensory deficits  LABORATORY DATA:  I have reviewed the data as listed    Component Value Date/Time   NA 136 07/09/2020 1321   NA 142 12/25/2014 1122   K 5.0 07/09/2020 1321   K 4.1 12/25/2014 1122   CL 106 07/09/2020 1321   CL 102 10/04/2012 1159   CO2 25 07/09/2020 1321   CO2 29 12/25/2014 1122   GLUCOSE 83 07/09/2020 1321   GLUCOSE 99 12/25/2014 1122   GLUCOSE 116 (H) 10/04/2012 1159   BUN 22 07/09/2020 1321   BUN 13.7 12/25/2014 1122   CREATININE 1.35 (H) 07/09/2020 1321   CREATININE 1.14 (H) 05/30/2020 1324  CREATININE 0.96 (H) 02/27/2016 1530   CREATININE 0.9 12/25/2014 1122   CALCIUM 9.7 07/09/2020 1321   CALCIUM 10.1 12/25/2014 1122   PROT 7.7 07/09/2020 1321   PROT 7.8 12/25/2014 1122   ALBUMIN 3.3 (L) 07/09/2020 1321   ALBUMIN 3.4 (L) 12/25/2014 1122   AST 38 07/09/2020 1321   AST 28 05/30/2020 1324   AST 21 12/25/2014 1122   ALT 8 07/09/2020 1321   ALT 10 05/30/2020 1324   ALT 17 12/25/2014 1122   ALKPHOS 77 07/09/2020 1321   ALKPHOS 99 12/25/2014 1122   BILITOT <0.2 (L) 07/09/2020 1321   BILITOT 0.3 05/30/2020 1324   BILITOT 0.41 12/25/2014 1122   GFRNONAA 39 (L) 07/09/2020 1321   GFRNONAA 48 (L) 05/30/2020 1324   GFRNONAA 58 (L) 02/27/2016 1530   GFRAA >60 02/15/2020 0940   GFRAA 66 02/27/2016 1530    No results found for: SPEP, UPEP  Lab Results  Component Value Date   WBC 6.6 07/09/2020   NEUTROABS 4.3 07/09/2020   HGB 8.2 (L) 07/09/2020   HCT 25.8 (L) 07/09/2020    MCV 105.3 (H) 07/09/2020   PLT 177 07/09/2020      Chemistry      Component Value Date/Time   NA 136 07/09/2020 1321   NA 142 12/25/2014 1122   K 5.0 07/09/2020 1321   K 4.1 12/25/2014 1122   CL 106 07/09/2020 1321   CL 102 10/04/2012 1159   CO2 25 07/09/2020 1321   CO2 29 12/25/2014 1122   BUN 22 07/09/2020 1321   BUN 13.7 12/25/2014 1122   CREATININE 1.35 (H) 07/09/2020 1321   CREATININE 1.14 (H) 05/30/2020 1324   CREATININE 0.96 (H) 02/27/2016 1530   CREATININE 0.9 12/25/2014 1122      Component Value Date/Time   CALCIUM 9.7 07/09/2020 1321   CALCIUM 10.1 12/25/2014 1122   ALKPHOS 77 07/09/2020 1321   ALKPHOS 99 12/25/2014 1122   AST 38 07/09/2020 1321   AST 28 05/30/2020 1324   AST 21 12/25/2014 1122   ALT 8 07/09/2020 1321   ALT 10 05/30/2020 1324   ALT 17 12/25/2014 1122   BILITOT <0.2 (L) 07/09/2020 1321   BILITOT 0.3 05/30/2020 1324   BILITOT 0.41 12/25/2014 1122       RADIOGRAPHIC STUDIES: I have reviewed multiple imaging studies with the patient and her daughter I have personally reviewed the radiological images as listed and agreed with the findings in the report. CT CHEST ABDOMEN PELVIS W CONTRAST  Result Date: 07/24/2020 CLINICAL DATA:  80 year old female with history of uterine/cervical cancer. Assess response to chemotherapy. Follow-up study. Additional history of left-sided breast cancer treated with lumpectomy in 2013. EXAM: CT CHEST, ABDOMEN, AND PELVIS WITH CONTRAST TECHNIQUE: Multidetector CT imaging of the chest, abdomen and pelvis was performed following the standard protocol during bolus administration of intravenous contrast. CONTRAST:  45m OMNIPAQUE IOHEXOL 300 MG/ML  SOLN COMPARISON:  CT the chest, abdomen and pelvis 04/27/2020. FINDINGS: CT CHEST FINDINGS Cardiovascular: Heart size is normal. There is no significant pericardial fluid, thickening or pericardial calcification. There is aortic atherosclerosis, as well as atherosclerosis of the  great vessels of the mediastinum and the coronary arteries, including calcified atherosclerotic plaque in the left main, left anterior descending, left circumflex and right coronary arteries. Right internal jugular single-lumen porta cath with tip terminating at the superior cavoatrial junction. Mediastinum/Nodes: No pathologically enlarged mediastinal or hilar lymph nodes. Esophagus is unremarkable in appearance. No axillary lymphadenopathy. Lungs/Pleura: Several scattered tiny  2-3 mm peribronchovascular pulmonary nodules are again noted in the lungs bilaterally, stable compared to the prior study and nonspecific, but statistically likely benign areas of mucoid impaction. There continues to be some subpleural nodularity as well, most evident in the base of the right hemithorax which is also similar to the prior examination. No other new suspicious appearing pulmonary nodules or masses are noted. No acute consolidative airspace disease. No pleural effusions. Musculoskeletal: There are no aggressive appearing lytic or blastic lesions noted in the visualized portions of the skeleton. CT ABDOMEN PELVIS FINDINGS Hepatobiliary: Subcentimeter low-attenuation lesions in the liver, too small to characterize, but similar to the prior study and statistically likely to represent tiny cysts. No other suspicious appearing hepatic lesions. No intra or extrahepatic biliary ductal dilatation. Gallbladder is nearly completely decompressed, but otherwise unremarkable in appearance. Pancreas: No pancreatic mass. No pancreatic ductal dilatation. No pancreatic or peripancreatic fluid collections or inflammatory changes. Spleen: Unremarkable. Adrenals/Urinary Tract: 2.6 cm low-attenuation lesion in the medial aspect of the upper pole of the left kidney compatible with a simple cyst. Right kidney and bilateral adrenal glands are normal in appearance. No hydroureteronephrosis. Urinary bladder is remarkable for asymmetric mural thickening  along the posterior wall, particularly on the right side where there is a new soft tissue mass measuring approximately 4.2 x 2.8 x 3.1 cm (axial image 103 of series 2 and coronal image 87 of series 6), which appears contiguous with the adjacent lower uterine segment and cervix, likely to reflect direct invasion of neoplasm into the urinary bladder. Stomach/Bowel: The appearance of the stomach is normal. There is no pathologic dilatation of small bowel or colon. Some thickening of the rectal wall is noted as it traverses by a large uterine/cervical mass (discussed below). Normal appendix. Vascular/Lymphatic: Aortic atherosclerosis, without evidence of aneurysm or dissection in the abdominal or pelvic vasculature. Extensive retroperitoneal and pelvic lymphadenopathy is noted. Specific examples include a large retrocaval lymph node (axial image 59 of series 2) which currently measures 2.1 cm (previously 1.4 cm). Multiple enlarged para-aortic lymph nodes measuring up to 1.5 cm in short axis on the left (axial image 70 of series 2). Right obturator lymph node measuring 1.6 cm in short axis (axial image 98 of series 2), similar to the prior examination. External iliac lymphadenopathy bilaterally measuring 2.1 cm on the right (axial image 97 of series 2) and 1.7 cm on the left (axial image 96 of series 2), similar to the prior examination. Bilateral inguinal lymphadenopathy measuring up to 1.9 cm on the right where there is a centrally necrotic lymph node (axial image 105 of series 2), which is similar to the prior examination. Reproductive: Enlargement of heterogeneously enhancing soft tissue attenuation mass extending posteriorly from the lower uterine segment and cervix to the left of the rectum (axial image 93 of series 2 and sagittal image 104 of series 6) which currently measures 9.7 x 5.5 x 8.4 cm (previously 3.4 x 2.1 cm). As previously described new abnormal soft tissue extends from the lower uterine segment and  cervix to involve the base of the urinary bladder and posterior wall (see discussion above). Bilateral adnexal masses have significantly increased in size currently measuring 11.1 x 7.0 x 7.3 cm on the right (axial image 89 of series 2 and coronal image 40 of series 5) and 8.0 x 5.5 x 6.5 cm on the left (axial image 90 of series 2 and coronal image 84 of series 5). Other: Significant increase in multiple soft tissue implants throughout  the peritoneal cavity. The largest of these include a 4.1 x 2.0 cm implant associated with the surface of the splenic flexure of the colon (axial image 44 of series 2), and a 2.7 x 2.5 cm lesion in the midline in the omentum (axial image 68 of series 2). Multiple other smaller peritoneal implants are also noted indicative of progressive metastatic disease. Trace volume of ascites most evident adjacent to the liver, similar to the prior examination. No pneumoperitoneum. Musculoskeletal: There are no aggressive appearing lytic or blastic lesions noted in the visualized portions of the skeleton. IMPRESSION: 1. Today's study demonstrates progression of disease, with enlargement of masses emanating from the lower uterine and cervix, with increasing direct extension involving adjacent structures including the urinary bladder and probable early involvement of the rectal wall. Large bilateral adnexal soft tissue masses have also increased in size. 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 2. Nonspecific nodularity in the lungs is stable compared to prior studies, favored to be benign, however, continued attention on follow-up studies is recommended. 3. Trace volume of ascites most evident adjacent to the liver, stable compared to the prior study. 4. Aortic atherosclerosis, in  addition to left main and 3 vessel coronary artery disease. 5. Additional incidental findings, as above. Electronically Signed   By: Vinnie Langton M.D.   On: 07/24/2020 08:27

## 2020-07-24 NOTE — Assessment & Plan Note (Signed)
We have extensive discussions about goals of care The patient is aware of poor prognosis We discussed CODE STATUS and she agreed for DNR I gave her a DNR order I recommend referral for home-based palliative care with hospice

## 2020-07-24 NOTE — Assessment & Plan Note (Signed)
I have reviewed multiple imaging studies with the patient and her daughter Unfortunately, the patient has significant disease progression She has progressed on carboplatin, paclitaxel, pembrolizumab and Lenvima At this point in time, she is quite frail with significant pancytopenia Frankly, I do not recommend further chemotherapy as her quality of life is poor and future chemotherapy will likely bring less than 20% benefits, but with significant high risk of complications After much discussion, she is in agreement for referral for palliative care with hospice

## 2020-08-08 ENCOUNTER — Ambulatory Visit (INDEPENDENT_AMBULATORY_CARE_PROVIDER_SITE_OTHER): Payer: Medicare Other

## 2020-08-08 DIAGNOSIS — Z0000 Encounter for general adult medical examination without abnormal findings: Secondary | ICD-10-CM | POA: Diagnosis not present

## 2020-08-08 NOTE — Progress Notes (Addendum)
Subjective:   Erin Esparza is a 80 y.o. female who presents for Medicare Annual (Subsequent) preventive examination.  Patient consented to have virtual visit and was identified by name and date of birth. Method of visit: Telephone  Encounter participants: Patient: Erin Esparza - located at Home   Nurse/Provider: Dorna Bloom - located at [facility] Others (if applicable): NA  Review of Systems: Defer to PCP.  Cardiac Risk Factors include: advanced age (>72men, >62 women)  Objective:   Vitals: There were no vitals taken for this visit.  There is no height or weight on file to calculate BMI.  Advanced Directives 08/08/2020 05/14/2020 03/28/2020 03/19/2020 02/15/2020 01/17/2020 11/18/2019  Does Patient Have a Medical Advance Directive? Yes Yes Yes Yes Yes Yes Yes  Type of Paramedic of [place];Living will [place];Living will [place];Living will [place];Living will [place];Living will [place]  Does patient want to make changes to medical advance directive? No - Patient declined No - Patient declined - No - Patient declined - - -  Copy of [place] in Chart? No - copy requested No - copy requested No - copy requested No - copy requested No - copy requested No - copy requested -  Would patient like information on creating a medical advance directive? - - No - Patient declined - - - No - Patient declined   Tobacco Social History   Tobacco Use  Smoking Status Former Smoker  . Packs/day: 0.30  . Years: 15.00  . Pack years: 4.50  . Types: Cigarettes  Smokeless Tobacco Never Used  Tobacco Comment   "stopped in the 1990s"     Counseling given: No plans to restart  Clinical Intake:  Pre-visit preparation completed: Yes  Pain Score: 0-No pain  How often do you need to have someone help you when you read  instructions, pamphlets, or other written materials from your doctor or pharmacy?: 2 - Rarely What is the last grade level you completed in school?: [place]  Interpreter Needed?: No  Past Medical History:  Diagnosis Date  . Abdominal pain 10/17/2016  . Arthritis    "both feet" (10/17/2016)  . Asthma   . Breast cancer ([place]) 01/06/12   left breast invasive ductal ca,dcis,ER/PR=+,  . Breast mass in female   . Cataract   . Clotting disorder ([place])   . 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 Diastolic congestive heart failure ([place])   . DVT (deep venous thrombosis) ([place]) 1990's?   LLE  . Endometrial ca ([place]) dx'd 2020  . Family history of breast cancer   . Family history of prostate cancer   . Hypertension   . Insulin resistance   . Pulmonary embolism ([place]) ~ 2012  . Radiation 02/12/12 -03/12/12   left breast, total 50gy  . Right ankle pain   . Shortness of breath   . Venous stasis of lower extremity   . Wears dentures    upper  . Wears glasses   . Wears partial dentures    lower   Past Surgical History:  Procedure Laterality Date  . BREAST BIOPSY Left 11/03/11   10 o'clock=benign breast parenchyma  . BREAST LUMPECTOMY Left 01/06/12    Dr. Erroll Luna  . INGUINAL HERNIA REPAIR Right 03/22/2019   Procedure: RIGHT INGUINAL HERNIA REPAIR WITH MESH;  Surgeon: Erroll Luna, MD;  Location: [facility];  Service: General;  Laterality: Right;  tap block  . IR IMAGING GUIDED PORT INSERTION  08/06/2018   Family History  Problem Relation Age of Onset  . Heart disease Mother   . Heart attack Mother   . Breast cancer Sister 43  . Parkinson's disease Brother   . Prostate cancer Brother   . Pulmonary embolism Daughter   . Diabetes Daughter   . Pulmonary embolism Daughter   . Breast cancer Sister   . Other Brother        MVA  . Other Brother        MVA  . COPD Brother   . Breast cancer Niece        dx 2s   Social History    Socioeconomic History  . Marital status: Widowed    Spouse name: Not on file  . Number of children: 5  . Years of education: 2  . Highest education level: Associate degree: occupational, Hotel manager, or vocational program  Occupational History  . Occupation: Retired    Fish farm manager: RETIRED  Tobacco Use  . Smoking status: Former Smoker    Packs/day: 0.30    Years: 15.00    Pack years: 4.50    Types: Cigarettes  . Smokeless tobacco: Never Used  . Tobacco comment: "stopped in the 1990s"  Vaping Use  . Vaping Use: Never used  Substance and Sexual Activity  . Alcohol use: No  . Drug use: No  . Sexual activity: Not Currently  Other Topics Concern  . Not on file  Social History Narrative   Patient lives with her daughter.    Patient enjoys spending time with her family, playing bingo, and watching TV.   Patient has recently been started on palliative care for cancer.        Social Determinants of Health   Financial Resource Strain: Low Risk   . Difficulty of Paying Living Expenses: Not hard at all  Food Insecurity: No Food Insecurity  . Worried About Charity fundraiser in the Last Year: Never true  . Ran Out of Food in the Last Year: Never true  Transportation Needs: No Transportation Needs  . Lack of Transportation (Medical): No  . Lack of Transportation (Non-Medical): No  Physical Activity: Insufficiently Active  . Days of Exercise per Week: 2 days  . Minutes of Exercise per Session: 10 min  Stress: No Stress Concern Present  . Feeling of Stress : Not at all  Social Connections: Moderately Integrated  . Frequency of Communication with Friends and Family: More than three times a week  . Frequency of Social Gatherings with Friends and Family: More than three times a week  . Attends Religious Services: More than 4 times per year  . Active Member of Clubs or Organizations: Yes  . Attends Archivist Meetings: More than 4 times per year  . Marital Status: Widowed    Outpatient Encounter Medications as of 08/08/2020  Medication Sig  . acetaminophen (TYLENOL) 500 MG tablet Take 1 tablet (500 mg total) by mouth every 6 (six) hours as needed. (Patient taking differently: Take 1,000 mg by mouth every 6 (six) hours as needed for moderate pain.)  . Calcium Carbonate (CALTRATE 600 PO) Take 1 tablet by mouth 2 (two) times daily.   . enalapril (VASOTEC) 20 MG tablet TAKE 1 TABLET(20 MG) BY MOUTH DAILY  . Fluticasone-Salmeterol (ADVAIR DISKUS) 100-50 MCG/DOSE AEPB INHALE 1 PUFF BY MOUTH TWICE DAILY  .  levothyroxine (SYNTHROID) 125 MCG tablet Take 1 tablet (125 mcg total) by mouth daily before breakfast.  . lidocaine-prilocaine (EMLA) cream Apply to affected area once (Patient taking differently: Apply 1 application topically daily as needed. Apply to affected area once)  . Multiple Vitamin (MULTIVITAMIN) tablet Take 1 tablet by mouth daily.  . Multiple Vitamins-Minerals (ICAPS AREDS 2 PO) Take 1 capsule by mouth 2 (two) times daily.  Marland Kitchen oxyCODONE (OXY IR/ROXICODONE) 5 MG immediate release tablet Take 1 tablet (5 mg total) by mouth every 6 (six) hours as needed for severe pain.  . polyethylene glycol (MIRALAX / GLYCOLAX) 17 g packet Take 17 g by mouth daily as needed for moderate constipation.  . furosemide (LASIX) 40 MG tablet Take 1 tablet (40 mg total) by mouth daily as needed (leg swelling). (Patient not taking: Reported on 08/08/2020)  . ondansetron (ZOFRAN) 8 MG tablet Take 1 tablet (8 mg total) by mouth every 8 (eight) hours as needed. Start on the third day after chemotherapy. (Patient not taking: Reported on 08/08/2020)  . prochlorperazine (COMPAZINE) 10 MG tablet Take 1 tablet (10 mg total) by mouth every 6 (six) hours as needed (Nausea or vomiting). (Patient not taking: Reported on 08/08/2020)  . rivaroxaban (XARELTO) 20 MG TABS tablet Take 1 tablet (20 mg total) by mouth daily with supper. (Patient not taking: Reported on 08/08/2020)  . rosuvastatin (CRESTOR) 10 MG  tablet Take 1 tablet (10 mg total) by mouth daily. Start with 5mg  for the first week. (Patient not taking: Reported on 08/08/2020)   No facility-administered encounter medications on file as of 08/08/2020.   Activities of Daily Living In your present state of health, do you have any difficulty performing the following activities: 08/08/2020  Hearing? N  Vision? N  Difficulty concentrating or making decisions? N  Walking or climbing stairs? Y  Dressing or bathing? N  Doing errands, shopping? N  Comment daughter Restaurant manager, fast food and eating ? N  Using the Toilet? N  In the past six months, have you accidently leaked urine? Y  Do you have problems with loss of bowel control? N  Managing your Medications? N  Managing your Finances? N  Housekeeping or managing your Housekeeping? N  Some recent data might be hidden   Patient Care Team: [name], DO as PCP - General (Family Medicine) [name], MD as Consulting Physician (Ophthalmology) Regal, Tamala Fothergill, DPM as Consulting Physician (Podiatry) [name], [name], MD as Referring Physician (Optometry) [name], MD (Inactive) as Consulting Physician (Orthopedic Surgery) Awanda Mink Craige Cotta, RN as Oncology Nurse Navigator (Oncology)    Assessment:   This is a routine wellness examination for Erin Esparza.  Exercise Activities and Dietary recommendations Current Exercise Habits: Home exercise routine, Type of exercise: walking, Time (Minutes): 15, Frequency (Times/Week): 2, Weekly Exercise (Minutes/Week): 30, Exercise limited by: respiratory conditions(s)  Goals    . Blood Pressure < 140/90    . Exercise 3x per week (30 min per time)    . Patient Stated     Positive outlook. Patient has recently been discharged to palliative care.     . Reduce sodium intake      Fall Risk Fall Risk  08/08/2020 11/18/2019 11/09/2019 11/01/2019 10/24/2019  Falls in the past year? 0 0 0 0 0  Number falls in past yr: - - 0 0 0  Injury with Fall? - - - -  0  Risk for fall due to : - - - - -  Follow up - - Falls evaluation completed - -   Is the patient's home free of loose throw rugs in walkways, pet beds, electrical cords, etc?   yes      Grab bars in the bathroom? yes      Handrails on the stairs?   yes      Adequate lighting?   yes  Patient rating of health (0-10) scale: 5   Depression Screen PHQ 2/9 Scores 08/08/2020 11/18/2019 11/09/2019 11/01/2019  PHQ - 2 Score 1 0 0 0  PHQ- 9 Score - - - -    Cognitive Function MMSE - Mini Mental State Exam 04/27/2018 10/04/2013 07/11/2011  Orientation to time 5 5 5   Orientation to Place 5 5 5   Registration 3 3 3   Attention/ Calculation 5 5 5   Recall 3 2 2   Language- name 2 objects 2 2 2   Language- repeat 1 1 1   Language- follow 3 step command 3 3 3   Language- read & follow direction 1 1 1   Write a sentence 1 1 1   Copy design 1 1 1   Total score 30 29 29    6CIT Screen 08/08/2020 04/27/2018  What Year? 0 points 0 points  What month? 0 points 0 points  What time? 0 points 0 points  Count back from 20 0 points 0 points  Months in reverse 0 points 0 points  Repeat phrase 2 points 0 points  Total Score 2 0   Immunization History  Administered Date(s) Administered  . Fluad Quad(high Dose 65+) 03/03/2019, 03/07/2020  . Influenza Split 03/12/2011  . Influenza Whole 03/25/2007, 03/06/2008, 03/08/2009, 03/19/2010  . Influenza,inj,Quad PF,6+ Mos 02/18/2013, 02/17/2014, 02/16/2015, 02/27/2016, 03/03/2017, 03/18/2018  . PFIZER(Purple Top)SARS-COV-2 Vaccination 09/17/2019, 10/12/2019, 02/15/2020  . Pneumococcal Conjugate-13 02/17/2014  . Pneumococcal Polysaccharide-23 01/06/2006  . Td 01/06/2006   Screening Tests Health Maintenance  Topic Date Due  . TETANUS/TDAP  01/07/2016  . COVID-19 Vaccine (4 - Booster for Pfizer series) 08/14/2020  . INFLUENZA VACCINE  Completed  . DEXA SCAN  Completed  . PNA vac Low Risk Adult  Completed  . HPV VACCINES  Aged Out   Cancer Screenings: Lung: Low Dose CT  Chest recommended if Age 43-80 years, 30 pack-year currently smoking OR have quit w/in 15years. Patient does not qualify. Breast:  Up to date on Mammogram? Yes   Up to date of Bone Density/Dexa? Yes Colorectal: never  Additional Screenings: Hepatitis C Screening: Completed  Plan:  Bring in your health care power of attorney forms. We will add them to your chart.  Continue walking outside with the nicer weather.  Please call the office to schedule an apt with your PCP.   I have personally reviewed and noted the following in the patient's chart:   . Medical and social history . Use of alcohol, tobacco or illicit drugs  . Current medications and supplements . Functional ability and status . Nutritional status . Physical activity . Advanced directives . List of other physicians . Hospitalizations, surgeries, and ER visits in previous 12 months . Vitals . Screenings to include cognitive, depression, and falls . Referrals and appointments  In addition, I have reviewed and discussed with patient certain preventive protocols, quality metrics, and best practice recommendations. A written personalized care plan for preventive services as well as general preventive health recommendations were provided to patient.  This visit was conducted virtually in the setting of the [place] pandemic.    Dorna Bloom, [place]  08/08/2020  I have reviewed this visit and agree with the documentation.   [name], DO

## 2020-08-08 NOTE — Patient Instructions (Addendum)
You spoke to Erin Esparza, [place] over the phone for your annual wellness visit.  We discussed goals:  Goals    . Blood Pressure < 140/90    . Exercise 3x per week (30 min per time)    . Patient Stated     Positive outlook. Patient has recently been discharged to palliative care.     . Reduce sodium intake      We also discussed recommended health maintenance. As discussed, you are due for a tetanus vaccine. We can send this to your local pharmacy for administration.   Health Maintenance  Topic Date Due  . TETANUS/TDAP  01/07/2016  . COVID-19 Vaccine (4 - Booster for Pfizer series) 08/14/2020  . INFLUENZA VACCINE  Completed  . DEXA SCAN  Completed  . PNA vac Low Risk Adult  Completed  . HPV VACCINES  Aged Out   Bring in your health care power of attorney forms. We will add them to your chart.  Continue walking outside with the nicer weather.  Please call the office to schedule an apt with your PCP.   Preventive Care 54 Years and Older, Female Preventive care refers to lifestyle choices and visits with your health care provider that can promote health and wellness. This includes:  A yearly physical exam. This is also called an annual wellness visit.  Regular dental and eye exams.  Immunizations.  Screening for certain conditions.  Healthy lifestyle choices, such as: ? Eating a healthy diet. ? Getting regular exercise. ? Not using drugs or products that contain nicotine and tobacco. ? Limiting alcohol use. What can I expect for my preventive care visit? Physical exam Your health care provider will check your:  Height and weight. These may be used to calculate your BMI (body mass index). BMI is a measurement that tells if you are at a healthy weight.  Heart rate and blood pressure.  Body temperature.  Skin for abnormal spots. Counseling Your health care provider may ask you questions about your:  Past medical problems.  Family's medical history.  Alcohol,  tobacco, and drug use.  Emotional well-being.  Home life and relationship well-being.  Sexual activity.  Diet, exercise, and sleep habits.  History of falls.  Memory and ability to understand (cognition).  Work and work Statistician.  Pregnancy and menstrual history.  Access to firearms. What immunizations do I need? Vaccines are usually given at various ages, according to a schedule. Your health care provider will recommend vaccines for you based on your age, medical history, and lifestyle or other factors, such as travel or where you work.   What tests do I need? Blood tests  Lipid and cholesterol levels. These may be checked every 5 years, or more often depending on your overall health.  Hepatitis C test.  Hepatitis B test. Screening  Lung cancer screening. You may have this screening every year starting at age 36 if you have a 30-pack-year history of smoking and currently smoke or have quit within the past 15 years.  Colorectal cancer screening. ? All adults should have this screening starting at age 30 and continuing until age 33. ? Your health care provider may recommend screening at age 69 if you are at increased risk. ? You will have tests every 1-10 years, depending on your results and the type of screening test.  Diabetes screening. ? This is done by checking your blood sugar (glucose) after you have not eaten for a while (fasting). ? You may have  this done every 1-3 years.  Mammogram. ? This may be done every 1-2 years. ? Talk with your health care provider about how often you should have regular mammograms.  Abdominal aortic aneurysm (AAA) screening. You may need this if you are a current or former smoker.  BRCA-related cancer screening. This may be done if you have a family history of breast, ovarian, tubal, or peritoneal cancers. Other tests  STD (sexually transmitted disease) testing, if you are at risk.  Bone density scan. This is done to screen for  osteoporosis. You may have this done starting at age 68. Talk with your health care provider about your test results, treatment options, and if necessary, the need for more tests. Follow these instructions at home: Eating and drinking  Eat a diet that includes fresh fruits and vegetables, whole grains, lean protein, and low-fat dairy products. Limit your intake of foods with high amounts of sugar, saturated fats, and salt.  Take vitamin and mineral supplements as recommended by your health care provider.  Do not drink alcohol if your health care provider tells you not to drink.  If you drink alcohol: ? Limit how much you have to 0-1 drink a day. ? Be aware of how much alcohol is in your drink. In the [place] one drink equals one 12 oz bottle of beer (355 mL), one 5 oz glass of wine (148 mL), or one 1 oz glass of hard liquor (44 mL).   Lifestyle  Take daily care of your teeth and gums. Brush your teeth every morning and night with fluoride toothpaste. Floss one time each day.  Stay active. Exercise for at least 30 minutes 5 or more days each week.  Do not use any products that contain nicotine or tobacco, such as cigarettes, e-cigarettes, and chewing tobacco. If you need help quitting, ask your health care provider.  Do not use drugs.  If you are sexually active, practice safe sex. Use a condom or other form of protection in order to prevent STIs (sexually transmitted infections).  Talk with your health care provider about taking a low-dose aspirin or statin.  Find healthy ways to cope with stress, such as: ? Meditation, yoga, or listening to music. ? Journaling. ? Talking to a trusted person. ? Spending time with friends and family. Safety  Always wear your seat belt while driving or riding in a vehicle.  Do not drive: ? If you have been drinking alcohol. Do not ride with someone who has been drinking. ? When you are tired or distracted. ? While texting.  Wear a helmet and  other protective equipment during sports activities.  If you have firearms in your house, make sure you follow all gun safety procedures. What's next?  Visit your health care provider once a year for an annual wellness visit.  Ask your health care provider how often you should have your eyes and teeth checked.  Stay up to date on all vaccines. This information is not intended to replace advice given to you by your health care provider. Make sure you discuss any questions you have with your health care provider. Document Revised: 05/09/2020 Document Reviewed: 05/13/2018 Elsevier Patient Education  2021 [place].  Our clinic's number is [phone]. Please call with questions or concerns about what we discussed today.

## 2020-09-18 ENCOUNTER — Telehealth: Payer: Self-pay

## 2020-09-30 NOTE — Telephone Encounter (Signed)
[facility] called and left a message. She passed this morning at home surrounded by her family.

## 2020-09-30 DEATH — deceased
